# Patient Record
Sex: Male | Born: 1935 | Race: White | Hispanic: No | Marital: Married | Smoking: Former smoker
Health system: Southern US, Community
[De-identification: ages and names within clinical notes are randomized; demographics above are authoritative.]

## PROBLEM LIST (undated history)

## (undated) DIAGNOSIS — I10 Essential (primary) hypertension: Secondary | ICD-10-CM

## (undated) DIAGNOSIS — I2510 Atherosclerotic heart disease of native coronary artery without angina pectoris: Secondary | ICD-10-CM

## (undated) DIAGNOSIS — E119 Type 2 diabetes mellitus without complications: Secondary | ICD-10-CM

## (undated) DIAGNOSIS — I639 Cerebral infarction, unspecified: Secondary | ICD-10-CM

## (undated) DIAGNOSIS — Z85820 Personal history of malignant melanoma of skin: Secondary | ICD-10-CM

## (undated) DIAGNOSIS — Z8673 Personal history of transient ischemic attack (TIA), and cerebral infarction without residual deficits: Secondary | ICD-10-CM

## (undated) DIAGNOSIS — K922 Gastrointestinal hemorrhage, unspecified: Secondary | ICD-10-CM

## (undated) HISTORY — PX: JOINT REPLACEMENT: SHX530

## (undated) HISTORY — DX: Essential (primary) hypertension: I10

## (undated) HISTORY — DX: Atherosclerotic heart disease of native coronary artery without angina pectoris: I25.10

## (undated) HISTORY — PX: OTHER SURGICAL HISTORY: SHX169

## (undated) HISTORY — DX: Personal history of malignant melanoma of skin: Z85.820

## (undated) HISTORY — PX: KNEE SURGERY: SHX244

## (undated) HISTORY — DX: Type 2 diabetes mellitus without complications: E11.9

## (undated) HISTORY — DX: Personal history of transient ischemic attack (TIA), and cerebral infarction without residual deficits: Z86.73

---

## 2000-09-26 ENCOUNTER — Encounter: Payer: Self-pay | Admitting: Internal Medicine

## 2000-09-26 ENCOUNTER — Observation Stay (HOSPITAL_COMMUNITY): Admission: EM | Admit: 2000-09-26 | Discharge: 2000-09-28 | Payer: Self-pay | Admitting: Internal Medicine

## 2003-08-24 ENCOUNTER — Emergency Department (HOSPITAL_COMMUNITY): Admission: EM | Admit: 2003-08-24 | Discharge: 2003-08-24 | Payer: Self-pay | Admitting: Emergency Medicine

## 2004-06-10 ENCOUNTER — Ambulatory Visit: Payer: Self-pay

## 2004-07-08 ENCOUNTER — Ambulatory Visit: Payer: Self-pay | Admitting: *Deleted

## 2004-07-09 ENCOUNTER — Ambulatory Visit (HOSPITAL_COMMUNITY): Admission: RE | Admit: 2004-07-09 | Discharge: 2004-07-09 | Payer: Self-pay | Admitting: *Deleted

## 2004-07-09 ENCOUNTER — Ambulatory Visit: Payer: Self-pay | Admitting: *Deleted

## 2004-10-23 ENCOUNTER — Encounter: Payer: Self-pay | Admitting: Internal Medicine

## 2004-10-23 ENCOUNTER — Ambulatory Visit: Payer: Self-pay | Admitting: Internal Medicine

## 2004-10-23 ENCOUNTER — Inpatient Hospital Stay (HOSPITAL_COMMUNITY): Admission: EM | Admit: 2004-10-23 | Discharge: 2004-10-25 | Payer: Self-pay | Admitting: Emergency Medicine

## 2004-10-28 ENCOUNTER — Ambulatory Visit (HOSPITAL_COMMUNITY): Admission: RE | Admit: 2004-10-28 | Discharge: 2004-10-28 | Payer: Self-pay | Admitting: Internal Medicine

## 2008-06-04 ENCOUNTER — Ambulatory Visit (HOSPITAL_COMMUNITY): Admission: RE | Admit: 2008-06-04 | Discharge: 2008-06-04 | Payer: Self-pay | Admitting: Family Medicine

## 2009-04-17 ENCOUNTER — Ambulatory Visit (HOSPITAL_COMMUNITY): Admission: RE | Admit: 2009-04-17 | Discharge: 2009-04-17 | Payer: Self-pay | Admitting: Family Medicine

## 2009-05-15 ENCOUNTER — Observation Stay (HOSPITAL_COMMUNITY): Admission: EM | Admit: 2009-05-15 | Discharge: 2009-05-16 | Payer: Self-pay | Admitting: Emergency Medicine

## 2009-05-16 ENCOUNTER — Encounter (INDEPENDENT_AMBULATORY_CARE_PROVIDER_SITE_OTHER): Payer: Self-pay | Admitting: Internal Medicine

## 2009-12-30 ENCOUNTER — Ambulatory Visit (HOSPITAL_COMMUNITY)
Admission: RE | Admit: 2009-12-30 | Discharge: 2009-12-30 | Payer: Self-pay | Source: Home / Self Care | Attending: Family Medicine | Admitting: Family Medicine

## 2010-02-14 ENCOUNTER — Encounter (HOSPITAL_COMMUNITY): Payer: Self-pay

## 2010-02-14 ENCOUNTER — Ambulatory Visit (HOSPITAL_COMMUNITY)
Admission: RE | Admit: 2010-02-14 | Discharge: 2010-02-14 | Disposition: A | Discharge status: Home / Self Care | Payer: MEDICARE | Source: Ambulatory Visit | Attending: Family Medicine | Admitting: Family Medicine

## 2010-02-14 ENCOUNTER — Other Ambulatory Visit (HOSPITAL_COMMUNITY): Payer: Self-pay | Admitting: Family Medicine

## 2010-02-14 DIAGNOSIS — Z09 Encounter for follow-up examination after completed treatment for conditions other than malignant neoplasm: Secondary | ICD-10-CM

## 2010-02-14 DIAGNOSIS — J189 Pneumonia, unspecified organism: Secondary | ICD-10-CM | POA: Insufficient documentation

## 2010-04-01 LAB — DIFFERENTIAL
Basophils Absolute: 0.1 10*3/uL (ref 0.0–0.1)
Basophils Relative: 1 % (ref 0–1)
Eosinophils Absolute: 0.2 10*3/uL (ref 0.0–0.7)
Lymphocytes Relative: 34 % (ref 12–46)
Lymphs Abs: 1.7 10*3/uL (ref 0.7–4.0)
Monocytes Relative: 11 % (ref 3–12)

## 2010-04-01 LAB — COMPREHENSIVE METABOLIC PANEL
ALT: 34 U/L (ref 0–53)
BUN: 10 mg/dL (ref 6–23)
Chloride: 105 mEq/L (ref 96–112)
Creatinine, Ser: 0.96 mg/dL (ref 0.4–1.5)
GFR calc Af Amer: >60 mL/min (ref >60)
Potassium: 3.8 mEq/L (ref 3.5–5.1)
Total Bilirubin: 0.6 mg/dL (ref 0.3–1.2)

## 2010-04-01 LAB — LIPID PANEL
HDL: 23 mg/dL — ABNORMAL LOW (ref >39)
Total CHOL/HDL Ratio: 6.7 RATIO

## 2010-04-01 LAB — CBC
HCT: 41.5 % (ref 39.0–52.0)
MCHC: 34.7 g/dL (ref 30.0–36.0)

## 2010-04-01 LAB — GLUCOSE, CAPILLARY
Glucose-Capillary: 116 mg/dL — ABNORMAL HIGH (ref 70–99)
Glucose-Capillary: 94 mg/dL (ref 70–99)

## 2010-04-01 LAB — TROPONIN I: Troponin I: 0.01 ng/mL (ref 0.00–0.06)

## 2010-04-01 LAB — CK TOTAL AND CKMB (NOT AT ARMC)
Relative Index: 2.7 — ABNORMAL HIGH (ref 0.0–2.5)
Total CK: 157 U/L (ref 7–232)

## 2010-04-01 LAB — HOMOCYSTEINE: Homocysteine: 17.1 umol/L — ABNORMAL HIGH (ref 4.0–15.4)

## 2010-04-01 LAB — PROTIME-INR: Prothrombin Time: 12.8 seconds (ref 11.6–15.2)

## 2010-04-15 ENCOUNTER — Inpatient Hospital Stay (HOSPITAL_COMMUNITY)
Admission: EM | Admit: 2010-04-15 | Discharge: 2010-04-17 | DRG: 690 | Disposition: A | Discharge status: Home / Self Care | Payer: Medicare Other | Attending: Internal Medicine | Admitting: Internal Medicine

## 2010-04-15 ENCOUNTER — Emergency Department (HOSPITAL_COMMUNITY): Payer: Medicare Other

## 2010-04-15 DIAGNOSIS — R6889 Other general symptoms and signs: Secondary | ICD-10-CM | POA: Diagnosis present

## 2010-04-15 DIAGNOSIS — E871 Hypo-osmolality and hyponatremia: Secondary | ICD-10-CM | POA: Diagnosis present

## 2010-04-15 DIAGNOSIS — I10 Essential (primary) hypertension: Secondary | ICD-10-CM | POA: Diagnosis present

## 2010-04-15 DIAGNOSIS — E876 Hypokalemia: Secondary | ICD-10-CM | POA: Diagnosis present

## 2010-04-15 DIAGNOSIS — Z8673 Personal history of transient ischemic attack (TIA), and cerebral infarction without residual deficits: Secondary | ICD-10-CM

## 2010-04-15 DIAGNOSIS — N390 Urinary tract infection, site not specified: Principal | ICD-10-CM | POA: Diagnosis present

## 2010-04-15 DIAGNOSIS — E119 Type 2 diabetes mellitus without complications: Secondary | ICD-10-CM | POA: Diagnosis present

## 2010-04-15 LAB — DIFFERENTIAL
Basophils Relative: 0 % (ref 0–1)
Lymphs Abs: 1.1 10*3/uL (ref 0.7–4.0)
Neutrophils Relative %: 80 % — ABNORMAL HIGH (ref 43–77)

## 2010-04-15 LAB — POCT CARDIAC MARKERS
CKMB, poc: <1 ng/mL — ABNORMAL LOW (ref 1.0–8.0)
Myoglobin, poc: 84.7 ng/mL (ref 12–200)
Troponin i, poc: <0.05 ng/mL (ref 0.00–0.09)

## 2010-04-15 LAB — URINE MICROSCOPIC-ADD ON

## 2010-04-15 LAB — GLUCOSE, CAPILLARY: Glucose-Capillary: 146 mg/dL — ABNORMAL HIGH (ref 70–99)

## 2010-04-15 LAB — CBC
MCH: 26.5 pg (ref 26.0–34.0)
Platelets: 256 10*3/uL (ref 150–400)
RBC: 5.09 MIL/uL (ref 4.22–5.81)
RDW: 14.3 % (ref 11.5–15.5)

## 2010-04-15 LAB — URINALYSIS, ROUTINE W REFLEX MICROSCOPIC
Glucose, UA: NEGATIVE mg/dL
Specific Gravity, Urine: 1.02 (ref 1.005–1.030)
pH: 8 (ref 5.0–8.0)

## 2010-04-15 LAB — BASIC METABOLIC PANEL
BUN: 12 mg/dL (ref 6–23)
CO2: 21 mEq/L (ref 19–32)
Chloride: 99 mEq/L (ref 96–112)
Creatinine, Ser: 0.82 mg/dL (ref 0.4–1.5)

## 2010-04-16 LAB — GLUCOSE, CAPILLARY: Glucose-Capillary: 131 mg/dL — ABNORMAL HIGH (ref 70–99)

## 2010-04-16 LAB — CARDIAC PANEL(CRET KIN+CKTOT+MB+TROPI)
CK, MB: 1.2 ng/mL (ref 0.3–4.0)
Relative Index: INVALID (ref 0.0–2.5)
Troponin I: 0.01 ng/mL (ref 0.00–0.06)
Troponin I: 0.02 ng/mL (ref 0.00–0.06)
Troponin I: <0.01 ng/mL (ref 0.00–0.06)

## 2010-04-16 LAB — TSH: TSH: 1.175 u[IU]/mL (ref 0.350–4.500)

## 2010-04-17 LAB — BASIC METABOLIC PANEL
BUN: 8 mg/dL (ref 6–23)
CO2: 23 mEq/L (ref 19–32)
Chloride: 107 mEq/L (ref 96–112)
Creatinine, Ser: 0.66 mg/dL (ref 0.4–1.5)
Glucose, Bld: 117 mg/dL — ABNORMAL HIGH (ref 70–99)

## 2010-04-17 LAB — HEPATIC FUNCTION PANEL
ALT: 18 U/L (ref 0–53)
AST: 12 U/L (ref 0–37)
Albumin: 3.3 g/dL — ABNORMAL LOW (ref 3.5–5.2)
Alkaline Phosphatase: 66 U/L (ref 39–117)
Indirect Bilirubin: 0.4 mg/dL (ref 0.3–0.9)
Total Protein: 6.7 g/dL (ref 6.0–8.3)

## 2010-04-17 LAB — DIFFERENTIAL
Basophils Relative: 0 % (ref 0–1)
Monocytes Relative: 11 % (ref 3–12)
Neutro Abs: 6.8 10*3/uL (ref 1.7–7.7)
Neutrophils Relative %: 69 % (ref 43–77)

## 2010-04-17 LAB — CBC
Hemoglobin: 12.7 g/dL — ABNORMAL LOW (ref 13.0–17.0)
MCH: 25.8 pg — ABNORMAL LOW (ref 26.0–34.0)
RBC: 4.93 MIL/uL (ref 4.22–5.81)

## 2010-04-18 LAB — URINE CULTURE
Colony Count: >=100000
Culture  Setup Time: 201204032000

## 2010-04-18 NOTE — H&P (Signed)
NAME:  Steve Ramirez, Steve Ramirez NO.:  [id number]  MEDICAL RECORD NO.:  [id number]           PATIENT TYPE:  I  LOCATION:  A320                          FACILITY:  [facility]  PHYSICIAN:  Steve Siren, MD           DATE OF BIRTH:  01-29-1935  DATE OF ADMISSION:  04/15/2010 DATE OF DISCHARGE:  LH                             HISTORY & PHYSICAL   PRIMARY CARE PHYSICIAN:  Steve Mckusick, MD  ADVANCE DIRECTIVE:  Full code.  REASON FOR ADMISSION:  Dehydration, hypokalemia, and UTI.  HISTORY OF PRESENT ILLNESS:  This is a 75 year old male with history of TIA x3, hypertension, but generally healthy (workout aerobically three times a week for 90 minutes each) presents to [facility] emergency room complaining of feeling weak with subjective fever.  He denies any dysuria, nausea, vomiting or any flank pain.  He has no abdominal pains or cramps, chest pain or shortness of breath.  He had been on Zestoretic for over 15 years without any problem but evaluation today in the emergency room shows he has a potassium of 3.3 and serum sodium 131. Clinically, he is dehydrated as well.  His EKG shows sinus rhythm without any acute ST-T changes.  His urinalysis showed 21-50 wbc's and many bacteria.  He has a mild leukocytosis with a white count of 12.7000.  His head CT is negative, and his cardiac markers were negative as well.  His hemoglobin is 13.5.  Hospitalist was asked to admit the patient because of dehydration, UTI, and hypokalemia.  PAST MEDICAL HISTORY: 1. TIA x3. 2. Hypertension.  MEDICATIONS:  Lisinopril/hydrochlorothiazide and Aggrenox one b.i.d.  ALLERGIES:  No known drug allergies.  REVIEW OF SYSTEMS:  Otherwise, unremarkable.  SOCIAL HISTORY:  He lives with his wife.  He is a retired Secondary school teacher.  He has several children and grandchildren.  Denied tobacco, alcohol or drug use.  PHYSICAL EXAMINATION:  VITAL SIGNS:  Blood pressure 145/74, pulse of 109, respiratory  rate of 18, temperature 99.5. GENERAL:  He is alert and oriented and he is in no apparent distress. NEURO:  His speech is fluent.  Tongue is midline.  He has facial symmetry. NECK:  Supple. HEENT:  Throat is clear. CARDIAC:  S1-S2 regular.  I did not hear any murmur, rub or gallop. LUNGS:  Clear.  He has no carotid bruit. ABDOMEN:  Soft, nondistended, nontender.  He has no CVA tenderness. EXTREMITIES:  No edema.  Strength equal bilaterally. SKIN:  Warm and dry. NEUROLOGICAL:  [name] exam unremarkable as well.  OBJECTIVE FINDINGS:  EKG shows sinus tachycardia with first-degree AV block, left axis deviation but no acute ST-T changes.  Head CT show evidence of sinusitis but, otherwise, no acute disease.  Troponin less than 0.05.  Serum sodium 131, potassium 3.3, blood sugar of 167, creatinine 0.82.  White count of 12.7000, hemoglobin 13.5, platelet count 256,000.  Urinalysis shows 21-50 wbc's with many bacteria.  Chest x-ray show elevation of left hemidiaphragm with linear density left base question of atelectasis versus fibrosis.  There is no infiltrate.  IMPRESSION:  This is a 75 year old male with  history of transient ischemic attack on Aggrenox, hypertension on Zestoretic, presented with urinary tract infection, hypokalemia, and clinically volume depleted. We will admit him and replete his potassium.  He will be given intravenous fluid.  I will continue his lisinopril, but will stop hydrochlorothiazide.  We will treat his urinary tract infection with Rocephin, and continue his Aggrenox for his recurrent transient ischemic attacks which apparently has been working well for him since he had not any symptoms for over a year.  He is a full code.  Will be admitted to [facility] 1.  He is stable.     Steve Siren, MD     PL/MEDQ  D:  04/15/2010  T:  04/15/2010  Job:  [id number]  cc:   Steve Ramirez, M.D. Fax: [phone]  Electronically Signed by Steve Ramirez  on 04/18/2010 10:41:57  PM

## 2010-04-22 NOTE — Discharge Summary (Signed)
NAME:  Steve Ramirez, Steve Ramirez NO.:  [id number]  MEDICAL RECORD NO.:  [id number]           PATIENT TYPE:  I  LOCATION:  A320                          FACILITY:  [facility]  PHYSICIAN:  Elliot Cousin, M.D.    DATE OF BIRTH:  January 05, 1936  DATE OF ADMISSION:  04/15/2010 DATE OF DISCHARGE:  04/05/2012LH                              DISCHARGE SUMMARY   DISCHARGE DIAGNOSES: 1. Generalized weakness secondary to urinary tract infection,     hypokalemia, and hyponatremia. 2. Hyponatremia, likely secondary to volume depletion. 3. Hypokalemia with lisinopril/hydrochlorothiazide     treatment. 4. Low free T4 of 0.67 but normal TSH of 1.175.  The result of the     free T3 was pending at the time of hospital discharge. 5. Hypertension. 6. History of transient ischemic attacks x3. 7. Diet-controlled type 2 diabetes mellitus.  DISCHARGE MEDICATIONS: 1. Ceftin 500 mg b.i.d. for seven more days. 2. Potassium chloride 20 mEq daily for 14 more days. 3. Aggrenox 25/200 mg one capsule b.i.d. 4. Diphenhydramine 25 mg one to two tablets every 6 hours as needed     for allergy symptoms. 5. Lisinopril/hydrochlorothiazide 20/12.5 mg b.i.d. 6. Loratadine 10 mg daily as needed for allergy symptoms. 7. Multivitamin once daily.  DISCHARGE DISPOSITION:  The patient was discharged to home in improved and stable condition on April 17, 2010.  He will follow up with Dr. Phillips Odor in 1-2 weeks.  CONSULTATIONS:  None.  PROCEDURES PERFORMED: 1. Chest x-ray on April 15, 2010.  The results revealed elevation of     the left hemidiaphragm with a linear density in the left base which     may reflect segmental atelectasis or fibrosis.  No consolidation or     pleural effusion is seen.  Slight flattening of the diaphragm on     the lateral image suggesting minimal hyperinflation. 2. CT scan of the head without contrast on April 15, 2010.  The results     revealed stable nonspecific cerebral white matter changes.   No     acute intracranial abnormality.  Chronic paranasal sinus     inflammatory changes with interval increase.  HISTORY OF PRESENT ILLNESS:  The patient is a 75 year old man with a history of TIAs x3 and hypertension, who presented to the emergency department on April 15, 2010, with a chief complaint of generalized weakness and subjective fever.  When he was initially evaluated, he was noted to be febrile with a temperature of 100.2 and hypertensive with a blood pressure of 160/85.  He was also mildly tachycardic.  His EKG revealed sinus tachycardia with a first-degree AV block and a heart rate of 108 beats per minute.  The CT scan of his head revealed no acute intracranial findings.  His initial cardiac markers were negative.  His WBC was elevated at 12.7.  His sodium was low at 131.  His potassium was also low at 3.3.  His urinalysis revealed 25-50 wbc's and many bacteria. He was admitted for further evaluation and management.  HOSPITAL COURSE:  The patient was given a dose of Cipro intravenously in the emergency department.  Following admission, he was started  on Rocephin 1 g daily.  IV fluids were started for volume repletion. Lisinopril was restarted for blood pressure control; however, hydrochlorothiazide was temporarily withheld while he was being hydrated.  For further evaluation, a number of studies were ordered. All of his cardiac enzymes were well within normal limits.  His vitamin B12 was within normal limits at 271.  His hepatic function panel was within normal limits.  His TSH was within normal limits at 1.175; however, his free T4 was low at 0.67.  Starting the the patient on Synthroid was contemplated; however, he wanted to discuss starting replacement therapy with Dr. Phillips Odor first.   Therefore, free T3 was ordered before he was discharged.  The result was pending at the time of hospital discharge.  If his free T3 is low, I will discuss the finding with Dr.  Phillips Odor.  The patient's blood pressure remained in the 160s to 150s systolically. He was advised to restart lisinopril/hydrochlorothiazide upon discharge given that he was well hydrated.  His serum potassium improved to 3.9.  He was discharged on potassium chloride supplementation for two more weeks.  His serum sodium improved to 140.  His BUN was 8 and his creatinine was 0.66 prior to hospital discharge.  His hemoglobin fell slightly to 12.3, but this was secondary to the dilutional effects of the IV fluids.  He received a total of three doses of intravenous antibiotics.  He was discharged home on seven more days of Ceftin. The urine culture was pending at discharge.  DISCHARGE LABORATORY STUDIES:  Sodium 140, potassium 3.9, chloride 107, CO2 is 23, glucose 117, BUN 8, creatinine 0.66, calcium 8.6, total bilirubin 0.6, direct bilirubin 0.2, indirect bilirubin 0.4, alkaline phosphatase 66, SGOT 12, SGPT 18, total protein 6.7, albumin 3.3, WBC 9.8, hemoglobin 12.7, platelet count 234.     Elliot Cousin, M.D.     DF/MEDQ  D:  04/17/2010  T:  04/18/2010  Job:  [id number]  cc:   Corrie Mckusick, M.D. Fax: [phone]  Electronically Signed by Elliot Cousin M.D. on 04/22/2010 08:51:22 AM

## 2010-05-30 NOTE — Op Note (Signed)
NAME:  Steve Ramirez, Steve Ramirez NO.:  [id number]   MEDICAL RECORD NO.:  [id number]          PATIENT TYPE:  INP   LOCATION:  A202                          FACILITY:  [facility]   PHYSICIAN:  Lionel December, M.D.    DATE OF BIRTH:  1935-02-12   DATE OF PROCEDURE:  10/26/2004  DATE OF DISCHARGE:  10/25/2004                                 OPERATIVE REPORT   PROCEDURE:  Small bowel given capsule study.   ENDOSCOPIST:  Lionel December, M.D.   INDICATIONS:  [name] is a 75 year old Caucasian male with history of  recurrent GI bleed, although he has not had an episode for almost four  years.  He presented with tarry stools.  He had esophagogastroduodenoscopy  yesterday without findings to account for his GI bleed.  He has undergone  colonoscopy in the past revealing pan colonic diverticulosis. He also has  had bleeding scans in the past which have been negative.  It is possible  that he has been losing blood from his small bowel and is therefore  undergoing this study.   Procedure risks were reviewed with the patient and informed consent was  obtained.   FINDINGS:  The patient swallowed capsule without any difficulty.  The  capsule made it into the stomach in 1 minute and 3 seconds and into the  duodenum at 1 hour and 19 minutes. Study duration was 8 hours but the  capsule never made it to the cecum.   Mucosa of the proximal half or two-thirds was normal.  Starting at hour and  27 minutes,  there was fresh and dark blood.  For the next 2-1/2 hours, that  was all that was seen, some fresh but mostly dark blood.  Because of  presence of blood, examination of mucosa was suboptimal but mucosa that was  seen was normal.   FINAL DIAGNOSES:  1.  Abnormal study with blood in the distal third to distal half of small      bowel limiting examination of mucosa.  2.  Capsule never made it to the cecum in this study.   RECOMMENDATION:  1.  Now that the bleeding has been localized to the distal  half of the small      bowel, we are ready to proceed with Meckel's scan.  2.  If scan is negative, we will do small-bowel follow-through followed by      repeat capsule study when he is not bleeding.  3.  Will hold off his iron therapy for now.  4.  If all of these studies are negative, we may outpatient to just watch      him or send him to a tertiary center for balloon enteroscopy.      Lionel December, M.D.  Electronically Signed     NR/MEDQ  D:  10/26/2004  T:  10/26/2004  Job:  [id number]   cc:   Patrica Duel, M.D.  Fax: [phone]

## 2010-05-30 NOTE — Procedures (Signed)
NAME:  Steve Ramirez, Steve Ramirez NO.:  [id number]   MEDICAL RECORD NO.:  [id number]          PATIENT TYPE:  OUT   LOCATION:  RAD                           FACILITY:  [facility]   PHYSICIAN:  Vida Roller, M.D.   DATE OF BIRTH:  [birth year]   DATE OF PROCEDURE:  07/09/2004  DATE OF DISCHARGE:                                  ECHOCARDIOGRAM   Tape Number [id number], Tape Count [id number]   This is a 75 year old man with mildly decreased LV systolic function and  hypertension.   Technical quality of the study is adequate.   M-MODE TRACING:  The aorta is 32 mm.   The left atrium is 41 mm.   The septum is 15 mm.   The posterior wall is 13 mm.   Left ventricular diastolic dimension 46 mm.   Left ventricular systolic dimension 37 mm.   2-D AND DOPPLER IMAGING:  The left ventricle is normal size with mildly  decreased LV systolic function with an estimated ejection fraction of 45 to  50%.  There was mild global hypokinesis with moderate left ventricular  hypertrophy.   The right ventricle is normal size with mild hypertrophy of the free wall  seen best in the parasternal long axis view as well as the subcostal view.  There is normal RV systolic function.   The left atrium appears to be slightly enlarged.  Right atrium is normal  size.   The aortic valve is sclerotic with trace insufficiency.  No stenosis is  seen.   The mitral valve is thickened with mild mitral regurgitation.  No stenosis  is seen.   There is mild tricuspid regurgitation.   The pulmonic valve is not well seen.   There is no pericardial effusion.   The inferior vena cava appears to be normal size.   The ascending aorta is normal to limits of the study.       JH/MEDQ  D:  07/09/2004  T:  07/09/2004  Job:  [id number]   cc:   Corrie Mckusick, M.D.  Fax: [phone]

## 2010-05-30 NOTE — H&P (Signed)
NAME:  JADD, GASIOR NO.:  [id number]   MEDICAL RECORD NO.:  [id number]          PATIENT TYPE:  INP   LOCATION:  A202                          FACILITY:  [facility]   PHYSICIAN:  Corrie Mckusick, M.D.  DATE OF BIRTH:  [birth year]   DATE OF ADMISSION:  10/23/2004  DATE OF DISCHARGE:  LH                                HISTORY & PHYSICAL   ADMISSION DIAGNOSIS:  Gastrointestinal bleed.   HISTORY OF PRESENT ILLNESS:  This is a 75 year old gentleman with  hypertension, history of gastric hemorrhage, GERD, history of diverticula,  history of colon polyps and history of anemia as well as hyperlipidemia who  presents with GI bleed x6 hours.  He has had a long history of significant  gastric hemorrhages in the past.  Dr. Jena Gauss and Dr. Karilyn Cota know him well and  he has really had a difficult time finding the bleeding source.  He again  woke up this morning with a little bit of abdominal cramping and had  melanotic stools.  He is otherwise stable and cardiovascular and respiratory  review of systems are all negative.   PAST MEDICAL HISTORY:  1.  Hypertension.  2.  GERD.  3.  Gastric hemorrhage.  4.  Colon polyp.  5.  Diverticula.  6.  History of iron deficiency anemia.  7.  Hyperlipidemia.   PAST SURGICAL HISTORY:  1.  Cervical fusion.  2.  Tonsillectomy and adenoidectomy.  3.  Left knee surgery.  4.  Hemorrhoid surgery.   SOCIAL HISTORY:  He is retired from Paramedic.  He does not  drink or smoke.   FAMILY HISTORY:  Noncontributory.   MEDICATIONS:  1.  Lotensin/HCTZ 20/12.5 b.i.d.  2.  Multivitamin daily.   ALLERGIES:  No known drug allergies.   PHYSICAL EXAMINATION:  GENERAL:  When I saw the patient, he was pleasant,  talking with good color in no acute distress.  VITAL SIGNS:  Temperature 97.9, blood pressure 146/91, pulse 70,  respirations 20, O2 saturations 96% on room air.  HEENT:  Nasopharynx is clear with moist mucus membranes.  NECK:   Supple with no lymphadenopathy.  CHEST:  Clear to auscultation bilaterally.  CARDIAC:  Regular rate and rhythm.  Normal S1, S2.  No S3, murmurs, rubs or  gallops.  ABDOMEN:  Bowel sounds slightly soft, nontender, nondistended.   LABORATORY DATA AND X-RAY FINDINGS:  CBC and Chem 12 overall normal with a  hematocrit of 38.0.   ASSESSMENT:  A 75 year old gentleman with hypertension, history of colonic  polyps, gastroesophageal reflux disease, diverticula and gastrointestinal  bleed in the past who presents with probable upper gastrointestinal bleed.   PLAN:  1.  Admit to 2A for close monitoring.  2.  IV fluids.  3.  Continue current medications.  4.  This patient is NPO and he is going to be seen by Dr. Jena Gauss very soon      for further workup and probable capsule study.  5.  Will continue to follow closely.  Dr. Regino Schultze will be following the      patient as I am going out  of town today.      Corrie Mckusick, M.D.  Electronically Signed     JCG/MEDQ  D:  10/23/2004  T:  10/23/2004  Job:  [id number]

## 2010-05-30 NOTE — Discharge Summary (Signed)
[facility]  Patient:    Steve Ramirez, Steve Ramirez Visit Number: [id number] MRN: [id number]          Service Type: Attending:  Dorthey Sawyer, M.D. Dictated by:   Dorthey Sawyer, M.D. Adm. Date:  09/26/00 Disc. Date: 09/28/00                             Discharge Summary  DISCHARGE DIAGNOSES:  Gastrointestinal bleed.  HISTORY OF PRESENT ILLNESS:  Please see admission H&P.  HOSPITAL COURSE:  A 75 year old gentleman with history of hypertension who was admitted with GI bleed originally thought to be from an upper GI source.  He did have a history of colon polyps.  He had had grossly positive tar like stools.  He was admitted for observation following serial hematocrits.  Type and cross holding 2 units.  IV Pepcid 20 mg q.12h.  EGD was needed, therefore gastroenterology was consulted.  Roetta Sessions, M.D. saw the patient and suggested EGD.  EGD was done on September 27, 2000 showing small 1 cm submucosal nodule in the duodenum and multiple erosions in the antrum and stomach.  No definitive source of bleeding.  It was decided to continue with colonoscopy.  On September 28, 2000 colonoscopy was done showing diverticulosis.  Again, it was not clear that this would have been the source for the bleed.  It was discussed with the patient due to the findings that a capsule study might be warranted.  Patient wanted to hold on this for now.  Hematocrit remained stable at 40.5 throughout the hospital stay.  No further bleeding.  The patient felt quite well.  Patient was ready for discharge on September 28, 2000 and felt quite well.  PHYSICAL EXAMINATION  CHEST:  Clear.  CARDIOVASCULAR:  Regular rate and rhythm.  No murmur.  ABDOMEN:  Soft, nontender.  Patient was discharged on pre admit medications with the addition of Aciphex 20 mg q.d.  Pathology pending from biopsies. Dictated by:   Dorthey Sawyer, M.D. Attending:  Dorthey Sawyer, M.D. DD:  10/18/00 TD:   10/18/00 Job: [id number]

## 2010-05-30 NOTE — Consult Note (Signed)
NAME:  Steve Ramirez, Steve Ramirez NO.:  [id number]   MEDICAL RECORD NO.:  [id number]          PATIENT TYPE:  INP   LOCATION:  A202                          FACILITY:  [facility]   PHYSICIAN:  R. Roetta Sessions, M.D. DATE OF BIRTH:  Jul 14, 1935   DATE OF CONSULTATION:  10/23/2004  DATE OF DISCHARGE:                                   CONSULTATION   REASON FOR CONSULTATION:  GI bleed, melena.   HISTORY OF PRESENT ILLNESS:  Mr. Aguila is a 75 year old Caucasian  gentleman with history of recurrent GI bleeding felt to be due to  diverticular bleed in the past.  He has had at least five or six episodes  over the last six or seven years.  The last time we saw him was in 2002.  In  September of 2002 he was hospitalized with another GI bleed and at that time  had an EGD with evidence of a 1-cm submucosal nodule in the duodenum,  multiple erosions in the stomach and antrum.  H pylori serologies and  CLOtest have been negative in the past.  He also had a colonoscopy which  revealed scattered diverticula, some in the splenic flexure and cecum as  well.  He has not had any endoscopic evaluation since.  He has received  transfusions for GI bleeds in the past and believes that he has had a total  of about 5 or 6 units of blood over the years.  He ws feeling well.  He woke  up this morning and his stomach started grumbling.  He went to the bathroom  and passed black stool.  This has happened once this morning.  He denies any  abdominal pain, nausea, vomiting.  His appetite has been good.  No  heartburn.  Bowel movements are generally regular.  He is not on any NSAIDs  or aspirin.  He has been fairly healthy.  A week ago, he was treated for  inner ear and was given Antivert and a steroid injection.  Otherwise, he has  been in fairly good health.   He was evaluated in the emergency department and found to have a hemoglobin  of 13.1, hematocrit 38, MCV 78.2, INR 1, BUN 17, creatinine 0.8,  glucose  129, white count 5100, platelets 299,000.   MEDICATIONS:  1.  Lisinopril.  2.  Hydrochlorothiazide.  3.  Multivitamin.  4.  Benadryl.   ALLERGIES:  NO KNOWN DRUG ALLERGIES.   PAST MEDICAL HISTORY:  1.  History of recurrent GI bleed.  The last time was in September of 2002,      as outlined above.  He has had multiple endoscopies in the past.  He      reports having some bleeding scans in the past as well.  At least one in      1997 showed some activity in the descending or sigmoid colon.  In 2000,      bleeding scan was negative.  2.  History of iron deficiency anemia felt to be multifactorial due to GI      bleed and frequent blood donations in the past.  He  has not had any      problems with this recently.  3.  History of palpable spleen on exam.  Further details unavailable to me      at the time.  4.  Hypertension.  5.  History of hyperplastic colon polyps.  6.  Allergies.  7.  No known heart disease per patient.  8.  Cervical vertebral fusion around 1996 of C3, C4, C5, and C6.  9.  Hemorrhoidectomy.  10. Left knee arthroscopy.   FAMILY HISTORY:  Father had diabetes mellitus.  Mother had emphysema.  Brother died of a MVA caused by a drunk driver at age 67.  Another brother  died in a plane crash.  He was a Pharmacist, hospital.  No family history of  colorectal carcinoma or IBD.   SOCIAL HISTORY:  He is married.  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  He quit  smoking 25 years ago.  No alcohol use.  He has three children and five  grandchildren.   REVIEW OF SYSTEMS:  See HPI for GI.  CONSTITUTIONAL:  No weight loss.  CARDIOPULMONARY:  No chest pain or shortness of breath.   PHYSICAL EXAMINATION:  VITAL SIGNS:  Heart rate 80, respirations 20,  afebrile.  GENERAL:  Pleasant, well-nourished, well-developed Caucasian male in no  acute distress.  SKIN:  Warm and dry.  No jaundice.  HEENT:  Conjunctivae  pink.  Sclerae nonicteric.  Oropharyngeal mucosa moist  and pink.  No lesions, erythema, or exudate.  NECK:  No lymphadenopathy or thyromegaly.  CHEST:  Lungs clear to auscultation.  CARDIAC:  Regular rate and rhythm.  Normal S1 and S2.  No murmurs, rubs, or  gallops.  ABDOMEN:  Positive bowel sounds.  Soft, nontender, nondistended.  Do not  appreciate spleen.  No hepatomegaly or other masses.  He has a tiny  umbilical hernia, easily reducible, nontender.  The abdomen is nontender,  and no rebound tenderness or guarding.  EXTREMITIES:  No edema.   LABORATORY DATA:  As mentioned in the HPI.  In addition, sodium 134,  potassium 3.6.   IMPRESSION:  The patient is a 75 year old Caucasian gentleman with history  of recurrent gastrointestinal bleeding felt to be due to diverticula in the  past.  He has had multiple endoscopic evaluations, as outlined above.  It  has been about four years since his last evaluation for bleed.  Given the  melena, it would be reasonable to recheck his upper gastrointestinal tract  today.  He has never had a Givens capsule endoscopy previously.  It would be  reasonable to consider this to rule out any other possible sources of  bleeding.  He is known to have right colonic diverticula, and this could  cause melena with bleed.   PLAN:  Will monitor his hemoglobin and hematocrit.  Will recheck this at 2  p.m. today.  Will keep him n.p.o.  Agree with IV Protonix.  Plan on upper  endoscopy today with Dr. Jena Gauss.  Further recommendations to follow.   I would like to thank Dr. Phillips Odor for allowing [place] to participate in the care  of this patient.      Tana Coast, P.AJonathon Bellows, M.D.  Electronically Signed    LL/MEDQ  D:  10/23/2004  T:  10/23/2004  Job:  [id number]   cc:   Corrie Mckusick, M.D.  Fax: [phone]   Day  surgery  [facility]

## 2010-05-30 NOTE — Op Note (Signed)
NAME:  Steve Ramirez, Steve Ramirez NO.:  [id number]   MEDICAL RECORD NO.:  [id number]          PATIENT TYPE:  INP   LOCATION:  A202                          FACILITY:  [facility]   PHYSICIAN:  R. Roetta Sessions, M.D. DATE OF BIRTH:  01/10/1936   DATE OF PROCEDURE:  10/23/2004  DATE OF DISCHARGE:                                 OPERATIVE REPORT   PROCEDURE:  Esophagogastroduodenoscopy with biopsy.   INDICATIONS FOR PROCEDURE:  The patient is a 75 year old gentleman admitted  to the hospital with melena. He has a history of GI bleeding of obscure  etiology. EGD is now being done. This approach has been discussed with the  patient at length. Potential risks, benefits, and alternatives have been  reviewed and questions answered. He is agreeable. Please see documentation  in the medical record.   PROCEDURE NOTE:  O2 saturation, blood pressure, pulse, and respirations were  monitored throughout the entire procedure. Conscious sedation with IV Versed  and Demerol in incremental doses.  Cetacaine spray for topical oropharyngeal  anesthesia.   INSTRUMENT:  Olympus video chip system.   FINDINGS:  Examination of the tubular esophagus revealed multiple small 1 to  3 mm pale nodules likely representing squamous papillomas. Mucosa otherwise  appeared normal. EGD junction easily traversed.   Stomach:  Gastric cavity was empty and insufflated well with air. Thorough  examination of gastric mucosa including retroflexed view of the proximal  stomach and esophagogastric junction demonstrated some circular focal what  appeared to be erosions with some overlying plaquing on the angularis.  Please see photos. There is no blood in the stomach. There was a 5-mm fundal  gland polyp along the greater curvature which was not manipulated. Pylorus  patent and easily traversed. Examination of bulb and second portion revealed  what appeared to be a 1-cm lipoma versus leiomyoma in the second portion of  the  duodenum. It did have a central depression or a volcano appearance with  a central area of what appeared to be erosion. The remainder of D1 and D2  appeared normal. This lesion was palpated with the biopsy forceps, and I  could not illicit any bleeding. This did appear to be a submucosal lesion.   THERAPEUTIC/DIAGNOSTIC MANEUVERS:  The area of focal plaque-like erosions on  the angularis was biopsied twice for histologic study with minimal bleeding.  Subsequently, one of the nodules in the esophagus was biopsied for  histologic study. The patient tolerated the procedure well and was reactive  to endoscopy.   IMPRESSION:  1.  Multiple small 1 to 3 mm pale raised nodules in the esophagus, likely      representing squamous papillomas (benign lesion), biopsied, otherwise      normal esophagus.  2.  Fundal gland polyp on the greater curvature not manipulated. Focal      eroded mucosa with overlying plaques, unusual appearance but of      uncertain clinical significance along the angularis, biopsied with      minimal bleeding otherwise normal stomach. Patent pylorus. Examination      of D1 and D2 revealed a 1-cm lipoma versus leiomyoma  with central      depression and central eroded area but no active bleeding and no active      bleeding precipitated with the biopsy palpation.   DISCUSSION:  The lesion in the duodenum is suspect to me. Leiomyomas can  sometimes bleed intermittently in significant amounts and otherwise be  difficult to indict as the cause of obscure GI bleeding.   RECOMMENDATIONS:  Will proceed with a Given small bowel capsule tomorrow and  will make further recommendations after that study has been reviewed.      Jonathon Bellows, M.D.  Electronically Signed     RMR/MEDQ  D:  10/23/2004  T:  10/23/2004  Job:  [id number]

## 2010-05-30 NOTE — Discharge Summary (Signed)
NAME:  Steve Ramirez, Steve Ramirez NO.:  [id number]   MEDICAL RECORD NO.:  [id number]          PATIENT TYPE:  INP   LOCATION:  A202                          FACILITY:  [facility]   PHYSICIAN:  Corrie Mckusick, M.D.  DATE OF BIRTH:  [birth year]   DATE OF ADMISSION:  10/23/2004  DATE OF DISCHARGE:  10/14/2006LH                                 DISCHARGE SUMMARY   HISTORY OF PRESENT ILLNESS:  Please see admission H&P.   PAST MEDICAL HISTORY:  Please see admission H&P.   HOSPITAL COURSE:  A 76 year old gentleman with hypertension, history of  colon polyps, GERD, diverticula, and gastrointestinal bleed in the past who  presents with a probable upper gastrointestinal bleed.  He is admitted NPO  and seen by Dr. Jena Gauss.  For the workup please see his note on admission and  consult for details.   EGD was done showing a 1-cm lesion and some erosions.  Capsule study was  done on October 24, 2004.  H&H had remained stable after admission.  I went  on vacation, was no longer rounding on the patient.  My partner, Dr. Patrica Duel, rounded the next two days.   Dr. Karilyn Cota saw the patient on October 25, 2004.  Hematocrit was stable at  32.  He was set up for a small-bowel follow-through as an outpatient and  possible repeat capsule study as the data had returned from the initial  capsule study was really inconclusive.   He was discharged home, on October 25, 2004, with no new medications.  He  was to continue his own home pre-hospital meds and Dr. Karilyn Cota set up a small-  bowel follow-through on October 28, 2004.   DISCHARGE PHYSICAL:  Please see Dr. Geanie Logan note on the day of discharge.   DISCHARGE CONDITION:  Improved and stable.      Corrie Mckusick, M.D.  Electronically Signed     JCG/MEDQ  D:  12/03/2004  T:  12/03/2004  Job:  [id number]

## 2010-08-12 MED ORDER — SODIUM CHLORIDE 0.45 % IV SOLN
Freq: Once | INTRAVENOUS | Status: AC
  Administered 2010-08-13: 1000 mL via INTRAVENOUS

## 2010-08-13 ENCOUNTER — Other Ambulatory Visit (INDEPENDENT_AMBULATORY_CARE_PROVIDER_SITE_OTHER): Payer: Self-pay | Admitting: Internal Medicine

## 2010-08-13 ENCOUNTER — Ambulatory Visit (HOSPITAL_COMMUNITY)
Admission: RE | Admit: 2010-08-13 | Discharge: 2010-08-13 | Disposition: A | Discharge status: Home / Self Care | Payer: Medicare Other | Source: Ambulatory Visit | Attending: Internal Medicine | Admitting: Internal Medicine

## 2010-08-13 ENCOUNTER — Encounter (HOSPITAL_COMMUNITY)
Admission: RE | Disposition: A | Discharge status: Home / Self Care | Payer: Self-pay | Source: Ambulatory Visit | Attending: Internal Medicine

## 2010-08-13 ENCOUNTER — Encounter (HOSPITAL_COMMUNITY): Payer: Self-pay | Admitting: *Deleted

## 2010-08-13 ENCOUNTER — Encounter (INDEPENDENT_AMBULATORY_CARE_PROVIDER_SITE_OTHER): Payer: Medicare Other | Admitting: Internal Medicine

## 2010-08-13 DIAGNOSIS — K5730 Diverticulosis of large intestine without perforation or abscess without bleeding: Secondary | ICD-10-CM

## 2010-08-13 DIAGNOSIS — Z79899 Other long term (current) drug therapy: Secondary | ICD-10-CM | POA: Insufficient documentation

## 2010-08-13 DIAGNOSIS — D126 Benign neoplasm of colon, unspecified: Secondary | ICD-10-CM

## 2010-08-13 DIAGNOSIS — K644 Residual hemorrhoidal skin tags: Secondary | ICD-10-CM

## 2010-08-13 DIAGNOSIS — I10 Essential (primary) hypertension: Secondary | ICD-10-CM | POA: Insufficient documentation

## 2010-08-13 DIAGNOSIS — E119 Type 2 diabetes mellitus without complications: Secondary | ICD-10-CM | POA: Insufficient documentation

## 2010-08-13 DIAGNOSIS — Z1211 Encounter for screening for malignant neoplasm of colon: Secondary | ICD-10-CM | POA: Insufficient documentation

## 2010-08-13 HISTORY — PX: COLONOSCOPY: SHX5424

## 2010-08-13 HISTORY — DX: Gastrointestinal hemorrhage, unspecified: K92.2

## 2010-08-13 SURGERY — COLONOSCOPY
Anesthesia: Moderate Sedation

## 2010-08-13 MED ORDER — MIDAZOLAM HCL 5 MG/5ML IJ SOLN
INTRAMUSCULAR | Status: DC | PRN
  Administered 2010-08-13: 2 mg via INTRAVENOUS
  Administered 2010-08-13: 1 mg via INTRAVENOUS
  Administered 2010-08-13: 2 mg via INTRAVENOUS

## 2010-08-13 MED ORDER — MEPERIDINE HCL 50 MG/ML IJ SOLN
INTRAMUSCULAR | Status: DC | PRN
  Administered 2010-08-13 (×2): 25 mg via INTRAVENOUS

## 2010-08-13 MED ORDER — SODIUM CHLORIDE 0.9 % IR SOLN
Status: DC | PRN
  Administered 2010-08-13: 8 mL

## 2010-08-13 MED ORDER — STERILE WATER FOR IRRIGATION IR SOLN
Status: DC | PRN
  Administered 2010-08-13: 08:00:00

## 2010-08-13 NOTE — Brief Op Note (Signed)
08/13/2010  8:27 AM  PATIENT:  Steve Ramirez  75 y.o. male  PRE-OPERATIVE DIAGNOSIS:  Screening  POST-OPERATIVE DIAGNOSIS:  diverticulosis; tortous colon; cecal polyp  PROCEDURE:  Procedure(s): COLONOSCOPY  SURGEON:  Surgeon(s): Malissa Hippo, MD  Colonoscopy note; Exam to cecum Redundant transverse colon and hepatic flexure Pancolonic diverticulosis; 6 mm sessile polyp at cecum post appendiceal orifice saline cyst saline-assisted polypectomy performed; Rectal mucosa normal; Noncritical narrowing to anal canal;

## 2010-08-13 NOTE — Op Note (Addendum)
NAME:  Steve Ramirez, Steve Ramirez NO.:  [id number]  MEDICAL RECORD NO.:  [id number]  LOCATION:  APPO                          FACILITY:  [facility]  PHYSICIAN:  Lionel December, M.D.    DATE OF BIRTH:  Apr 29, 1935  DATE OF PROCEDURE:  08/13/2010 DATE OF DISCHARGE:  08/13/2010                              OPERATIVE REPORT   PROCEDURE:  Colonoscopy.  INDICATIONS:  [name] is a 75 year old Caucasian male who is undergoing average risk screening colonoscopy.  Procedure risks were reviewed with the patient and informed consent was obtained.  MEDICATIONS FOR CONSCIOUS SEDATION: 1. Demerol 50 mg IV. 2. Versed 5 mg IV.  FINDINGS:  Procedure performed in endoscopy suite.  The patient's vital signs and O2 sats were monitored during the procedure and remained stable.  The patient was placed in left lateral recumbent position and rectal examination performed.  Noncritical narrowing noted to anal canal.  Olympus video scope was passed through rectum and advanced under vision into sigmoid colon beyond.  He had moderate number of diverticula at sigmoid colon.  Some were quite large and in rest of the colon he had diverticula scattered as well.  Very redundant transverse colon, hepatic flexure with using different positions and abdominal pressure, I was able to advance the scope into cecum which was identified by appendiceal orifice and ileocecal valve.  There was a 6-mm sessile polyp to the left appendiceal orifice with difficult approach.  This was elevated with saline injection and snared.  Polypectomy was complete.  This polyp was retrieved for a sludge examination.  Mucosa and rest of the colon was normal.  Rectal mucosa similarly was normal.  Scope was retroflexed to examine anorectal junction which was unremarkable.  Endoscope was straightened and withdrawn.  Withdrawal time was 13 minutes.  The patient tolerated the procedure well.  FINAL DIAGNOSES: 1. Examination performed to  cecum. 2. Pancolonic diverticulosis. 3. A 6-mm cecal polyp snared following saline injection. 4. Noncritical narrowing to anal canal.  RECOMMENDATIONS: 1. He will resume his Aggrenox in 3 days. 2. High-fiber diet. 3. I will be contacting the patient with results of biopsy and    further recommendations.          ______________________________ Lionel December, M.D.     NR/MEDQ  D:  08/13/2010  T:  08/13/2010  Job:  [id number]  cc:   Corrie Mckusick, M.D. Fax: [phone]

## 2010-08-13 NOTE — H&P (Signed)
Steve Ramirez is an 75 y.o. male.   Chief Complaint: For colonoscopy HPI: Patient is a 75 year old Caucasian male who is here for screening colonoscopy. He has history of GI bleed felt to be secondary to colonic diverticulosis but not recently his most recent evaluation for GI bleed was in 2006 with EGD and small bowel given capsule study his last colonoscopy was few years prior to that. He has a good appetite he denies abdominal pain change in his bowel habits melena or rectal bleeding. Family history is negative for colorectal carcinoma 3  Past Medical History  Diagnosis Date  . Cancer   . Diabetes mellitus   . Hypertension   . Blood transfusion 2004 (?)    Past Surgical History  Procedure Date  . Tla 2010    No family history on file. Social History:  reports that he has quit smoking. He does not have any smokeless tobacco history on file. He reports that he does not drink alcohol or use illicit drugs.  Allergies: No Known Allergies  Medications Prior to Admission  Medication Dose Route Frequency Provider Last Rate Last Dose  . 0.45 % sodium chloride infusion   Intravenous Once Malissa Hippo, MD 20 mL/hr at 08/13/10 0707 1,000 mL at 08/13/10 0707  . simethicone susp 15 ml in sterile water 1000 ml irrigation    PRN Malissa Hippo, MD       Medications Prior to Admission  Medication Sig Dispense Refill  . cetirizine-pseudoephedrine (ZYRTEC-D) 5-120 MG per tablet Take 1 tablet by mouth 2 (two) times daily.        Marland Kitchen dipyridamole-aspirin (AGGRENOX) 25-200 MG per 12 hr capsule Take 1 capsule by mouth 2 (two) times daily.        . fish oil-omega-3 fatty acids 1000 MG capsule Take 2 g by mouth daily.        Marland Kitchen lisinopril-hydrochlorothiazide (PRINZIDE,ZESTORETIC) 20-12.5 MG per tablet Take 1 tablet by mouth 2 times daily at 12 noon and 4 pm.        . Multiple Vitamin (MULTIVITAMIN) tablet Take 1 tablet by mouth daily.          No results found for this or any previous visit  (from the past 48 hour(s)). No results found.  Review of Systems  Gastrointestinal: Negative for nausea, vomiting, abdominal pain, diarrhea, constipation, blood in stool and melena.    Blood pressure 167/91, pulse 61, temperature 97.9 F (36.6 C), temperature source Oral, resp. rate 18, height 5' 8.5" (1.74 m), weight 185 lb (83.915 kg), SpO2 95.00%. Physical Exam  Constitutional: He appears well-developed and well-nourished.  HENT:  Mouth/Throat: Oropharynx is clear and moist.  Eyes: Conjunctivae are normal. No scleral icterus.  Neck: Neck supple. No thyromegaly present.  Cardiovascular: Normal rate, regular rhythm and normal heart sounds.   No murmur heard. Respiratory: Breath sounds normal.  GI: Soft. He exhibits no distension. There is no tenderness.  Musculoskeletal: He exhibits no edema.  Lymphadenopathy:    He has no cervical adenopathy.  Neurological: He is alert.  Skin: Skin is warm and dry.     Assessment/Plan Average risk screening colonoscopy Procedure  reviewed the patient and he is agreeable.  Bennett Ram U 08/13/2010, 7:34 AM

## 2010-08-20 ENCOUNTER — Encounter (HOSPITAL_COMMUNITY): Payer: Self-pay | Admitting: Internal Medicine

## 2010-08-25 ENCOUNTER — Encounter (INDEPENDENT_AMBULATORY_CARE_PROVIDER_SITE_OTHER): Payer: Self-pay | Admitting: *Deleted

## 2010-11-22 ENCOUNTER — Emergency Department (HOSPITAL_COMMUNITY): Payer: Medicare Other

## 2010-11-22 ENCOUNTER — Inpatient Hospital Stay (HOSPITAL_COMMUNITY)
Admission: EM | Admit: 2010-11-22 | Discharge: 2010-11-26 | DRG: 282 | Disposition: A | Discharge status: Home / Self Care | Payer: Medicare Other | Attending: Cardiology | Admitting: Cardiology

## 2010-11-22 ENCOUNTER — Encounter (HOSPITAL_COMMUNITY): Payer: Self-pay | Admitting: Emergency Medicine

## 2010-11-22 ENCOUNTER — Other Ambulatory Visit: Payer: Self-pay

## 2010-11-22 DIAGNOSIS — I214 Non-ST elevation (NSTEMI) myocardial infarction: Secondary | ICD-10-CM

## 2010-11-22 DIAGNOSIS — E119 Type 2 diabetes mellitus without complications: Secondary | ICD-10-CM | POA: Diagnosis present

## 2010-11-22 DIAGNOSIS — Z8673 Personal history of transient ischemic attack (TIA), and cerebral infarction without residual deficits: Secondary | ICD-10-CM

## 2010-11-22 DIAGNOSIS — E785 Hyperlipidemia, unspecified: Secondary | ICD-10-CM | POA: Diagnosis present

## 2010-11-22 DIAGNOSIS — Z7902 Long term (current) use of antithrombotics/antiplatelets: Secondary | ICD-10-CM

## 2010-11-22 DIAGNOSIS — I10 Essential (primary) hypertension: Secondary | ICD-10-CM | POA: Diagnosis present

## 2010-11-22 DIAGNOSIS — I2582 Chronic total occlusion of coronary artery: Secondary | ICD-10-CM | POA: Diagnosis present

## 2010-11-22 DIAGNOSIS — I2510 Atherosclerotic heart disease of native coronary artery without angina pectoris: Secondary | ICD-10-CM

## 2010-11-22 DIAGNOSIS — E782 Mixed hyperlipidemia: Secondary | ICD-10-CM

## 2010-11-22 LAB — CBC
Hemoglobin: 13.3 g/dL (ref 13.0–17.0)
MCH: 25.8 pg — ABNORMAL LOW (ref 26.0–34.0)
MCHC: 32.8 g/dL (ref 30.0–36.0)
Platelets: 274 10*3/uL (ref 150–400)
RDW: 14.8 % (ref 11.5–15.5)

## 2010-11-22 LAB — CARDIAC PANEL(CRET KIN+CKTOT+MB+TROPI)
CK, MB: 45 ng/mL (ref 0.3–4.0)
Relative Index: 5.7 — ABNORMAL HIGH (ref 0.0–2.5)
Total CK: 793 U/L — ABNORMAL HIGH (ref 7–232)

## 2010-11-22 LAB — BASIC METABOLIC PANEL
Calcium: 9.4 mg/dL (ref 8.4–10.5)
GFR calc non Af Amer: 85 mL/min — ABNORMAL LOW (ref >90)
Glucose, Bld: 155 mg/dL — ABNORMAL HIGH (ref 70–99)
Sodium: 135 mEq/L (ref 135–145)

## 2010-11-22 LAB — PRO B NATRIURETIC PEPTIDE: Pro B Natriuretic peptide (BNP): 550.6 pg/mL — ABNORMAL HIGH (ref 0–125)

## 2010-11-22 MED ORDER — NITROGLYCERIN 0.4 MG SL SUBL
0.4000 mg | SUBLINGUAL_TABLET | SUBLINGUAL | Status: DC | PRN
  Administered 2010-11-22 (×3): 0.4 mg via SUBLINGUAL
  Filled 2010-11-22: qty 25

## 2010-11-22 MED ORDER — ASPIRIN 325 MG PO TABS
325.0000 mg | ORAL_TABLET | ORAL | Status: AC
  Administered 2010-11-22: 325 mg via ORAL
  Filled 2010-11-22: qty 1

## 2010-11-22 MED ORDER — HEPARIN BOLUS VIA INFUSION
4000.0000 [IU] | Freq: Once | INTRAVENOUS | Status: AC
  Administered 2010-11-22: 4000 [IU] via INTRAVENOUS
  Filled 2010-11-22: qty 4000

## 2010-11-22 MED ORDER — NITROGLYCERIN IN D5W 200-5 MCG/ML-% IV SOLN
2.0000 ug/min | INTRAVENOUS | Status: DC
  Administered 2010-11-22: 10 ug/min via INTRAVENOUS
  Administered 2010-11-23: 30 ug/min via INTRAVENOUS
  Filled 2010-11-22 (×3): qty 250

## 2010-11-22 MED ORDER — HEPARIN (PORCINE) IN NACL 100-0.45 UNIT/ML-% IJ SOLN
1900.0000 [IU]/h | INTRAMUSCULAR | Status: DC
  Administered 2010-11-22: 1000 [IU]/h via INTRAVENOUS
  Filled 2010-11-22 (×4): qty 250

## 2010-11-22 NOTE — ED Notes (Signed)
Nitroglycerin increased to 83mcg/min pain a 2

## 2010-11-22 NOTE — ED Provider Notes (Addendum)
History  Scribed for Geoffery Lyons, MD, the patient was seen in room APA05. This chart was scribed by Hillery Hunter.   CSN: [id number] Arrival date & time: 11/22/2010  8:24 PM   First MD Initiated Contact with Patient 11/22/10 2110      Chief Complaint  Patient presents with  . Chest Pain  . Shortness of Breath   Patient is a 75 y.o. male presenting with shortness of breath. The history is provided by the patient.  Shortness of Breath  Associated symptoms include chest pain and shortness of breath.    Steve Ramirez is a 75 y.o. male who presents to the Emergency Department complaining of left-sided chest pain with radiation to left arm that started at 16:00 today (5.5 hours ago). He states this pain felt like indigestion and so he tried taking antacids which did not improve pain. He complains of associated mild shortness of breath and denies nausea and diaphoresis. He denies previous CAD, MI or catheterization. He has taken NTG x2 and ASA 325mg  PO which has improved pain somewhat. He states that he exercises regularly without difficulty and that he had a history of DM which resolved and he stopped taking medication. He has a medical history for GI bleeding.    Past Medical History  Diagnosis Date  . Cancer   . Diabetes mellitus   . Hypertension   . Blood transfusion 2004 (?)  . GIB (gastrointestinal bleeding) in [phone] and 2006    felt to be colonic diverticular bleed; no problems since otc,2006    Past Surgical History  Procedure Date  . Tla 2010  . Colonoscopy 08/13/2010    Procedure: COLONOSCOPY;  Surgeon: Malissa Hippo, MD;  Location: [facility];  Service: Endoscopy;  Laterality: N/A;  3,4,5,6 cervical fusion  No family history on file.  History  Substance Use Topics  . Smoking status: Former Games developer  . Smokeless tobacco: Not on file  . Alcohol Use: No      Review of Systems  Constitutional: Negative for diaphoresis.  Respiratory:  Positive for shortness of breath.   Cardiovascular: Positive for chest pain.  Gastrointestinal: Negative for nausea and blood in stool.  Skin: Negative for color change.  Psychiatric/Behavioral: Negative for confusion.  All other systems reviewed and are negative.    Allergies  Review of patient's allergies indicates no known allergies.  Home Medications   Current Outpatient Rx  Name Route Sig Dispense Refill  . ASPIRIN-DIPYRIDAMOLE 25-200 MG PO CP12 Oral Take 1 capsule by mouth 2 (two) times daily.      Marland Kitchen LISINOPRIL-HYDROCHLOROTHIAZIDE 20-12.5 MG PO TABS Oral Take 1 tablet by mouth 2 times daily at 12 noon and 4 pm.      . ONE-DAILY MULTI VITAMINS PO TABS Oral Take 1 tablet by mouth daily.        BP 154/71  Pulse 88  Temp(Src) 98.8 F (37.1 C) (Oral)  Resp 24  Ht 5' 8.5" (1.74 m)  Wt 190 lb (86.183 kg)  BMI 28.47 kg/m2  SpO2 97%  Physical Exam  Nursing note and vitals reviewed. Constitutional: He is oriented to person, place, and time. He appears well-developed and well-nourished. No distress.  HENT:  Head: Normocephalic and atraumatic.  Eyes: Conjunctivae are normal. Pupils are equal, round, and reactive to light. No scleral icterus.  Neck: Neck supple. No thyromegaly present.  Cardiovascular: Normal rate, regular rhythm and normal heart sounds.   No murmur heard. Pulmonary/Chest: Effort normal and breath sounds normal.  No respiratory distress. He has no wheezes. He has no rales. He exhibits no tenderness.  Abdominal: Soft. Bowel sounds are normal. He exhibits no distension and no mass. There is no tenderness. There is no rebound and no guarding.  Musculoskeletal: He exhibits no edema and no tenderness.  Neurological: He is alert and oriented to person, place, and time.  Skin: Skin is warm and dry. He is not diaphoretic.  Psychiatric: He has a normal mood and affect. His behavior is normal.    ED Course  Procedures   Labs Reviewed  CBC - Abnormal; Notable for the  following:    MCH 25.8 (*)    All other components within normal limits  BASIC METABOLIC PANEL - Abnormal; Notable for the following:    Glucose, Bld 155 (*)    GFR calc non Af Amer 85 (*)    All other components within normal limits  PRO B NATRIURETIC PEPTIDE - Abnormal; Notable for the following:    BNP, POC 550.6 (*)    All other components within normal limits  CARDIAC PANEL(CRET KIN+CKTOT+MB+TROPI) - Abnormal; Notable for the following:    Total CK 793 (*)    CK, MB 45.0 (*)    Troponin I 3.38 (*)    Relative Index 5.7 (*)    All other components within normal limits  PROTIME-INR   Dg Chest Portable 1 View  11/22/2010  *RADIOLOGY REPORT*  Clinical Data: Chest pain.  PORTABLE CHEST - 1 VIEW  Comparison: Chest x-ray 09/29/2010.  Findings: The cardiac silhouette, mediastinal and hilar contours are within normal limits and stable.  The lungs are clear except for minimal streaky bibasilar atelectasis.  No edema or effusions. The bony thorax is intact.  IMPRESSION: Minimal streaky bibasilar atelectasis.  Original Report Authenticated By: P. Loralie Champagne, M.D.     OTHER DATA REVIEWED: Nursing notes, vital signs, and past medical records reviewed.   DIAGNOSTIC STUDIES: Oxygen Saturation is 97% on room air, normal by my interpretation.     ED COURSE / COORDINATION OF CARE: 20:33. Ordered prior to my assessment: Cardiac monitoring ; Pulse oximetry, continuous ; ED EKG (<69mins upon arrival to the ED) ; Saline lock IV ; Draw & Hold Blood RAINBOW ; CBC ; Basic metabolic panel ; BNP (if dyspnea) ; Weigh patient, DD Oxygen therapy ; aspirin tablet 325 mg ; nitroGLYCERIN (NITROSTAT) SL tablet 0.4 mg ; DG Chest Portable 1 View ; Cardiac panel(cret kin+cktot+mb+tropi) ; Protime-INR (if Coumadin Pt), LH DG CHEST PORTABLE 1 VIEW.   21:29. Discussed test findings with patient and spouse at bedside including need to transfer for cardiac catheterization. Patient acknowledges risk and benefits of  treatment and is amenable to treatment plan.   Date: 11/22/2010  Rate: 86  Rhythm: normal sinus rhythm  QRS Axis: Left axis deviation  Intervals: normal  ST/T Wave abnormalities: nonspecific ST changes  Conduction Disutrbances:none  Narrative Interpretation:   Old EKG Reviewed: unchanged     MDM  Enzymes positive.  Will contact Cardiology for transfer, cardiac cath.  Spoke with Dr. Terressa Koyanagi who agrees to accept the patient in transfer.   1. Non-ST elevation MI (NSTEMI)    CRITICAL CARE Performed by: Geoffery Lyons   Total critical care time: 30 minutes.  Critical care time was exclusive of separately billable procedures and treating other patients.  Critical care was necessary to treat or prevent imminent or life-threatening deterioration.  Critical care was time spent personally by me on the following activities: development of  treatment plan with patient and/or surrogate as well as nursing, discussions with consultants, evaluation of patient's response to treatment, examination of patient, obtaining history from patient or surrogate, ordering and performing treatments and interventions, ordering and review of laboratory studies, ordering and review of radiographic studies, pulse oximetry and re-evaluation of patient's condition.  I personally performed the services described in this documentation, which was scribed in my presence. The recorded information has been reviewed and considered.    Geoffery Lyons, MD 11/22/10 1610  Geoffery Lyons, MD 11/22/10 9604  Geoffery Lyons, MD 11/22/10 [phone]

## 2010-11-22 NOTE — ED Notes (Signed)
Patient states woke up around 0300 with indigestion.  States pain has continued all day.  States some shortness of breath and some nausea; states pain radiates to left arm.

## 2010-11-22 NOTE — ED Notes (Signed)
Pt has taken baby aspirin and tums with no relief.

## 2010-11-23 ENCOUNTER — Encounter (HOSPITAL_COMMUNITY): Payer: Self-pay | Admitting: *Deleted

## 2010-11-23 DIAGNOSIS — E119 Type 2 diabetes mellitus without complications: Secondary | ICD-10-CM | POA: Diagnosis present

## 2010-11-23 DIAGNOSIS — I369 Nonrheumatic tricuspid valve disorder, unspecified: Secondary | ICD-10-CM

## 2010-11-23 DIAGNOSIS — I214 Non-ST elevation (NSTEMI) myocardial infarction: Secondary | ICD-10-CM

## 2010-11-23 DIAGNOSIS — I10 Essential (primary) hypertension: Secondary | ICD-10-CM | POA: Diagnosis present

## 2010-11-23 DIAGNOSIS — E785 Hyperlipidemia, unspecified: Secondary | ICD-10-CM | POA: Diagnosis present

## 2010-11-23 DIAGNOSIS — Z8673 Personal history of transient ischemic attack (TIA), and cerebral infarction without residual deficits: Secondary | ICD-10-CM

## 2010-11-23 LAB — GLUCOSE, CAPILLARY
Glucose-Capillary: 147 mg/dL — ABNORMAL HIGH (ref 70–99)
Glucose-Capillary: 139 mg/dL — ABNORMAL HIGH (ref 70–99)
Glucose-Capillary: 119 mg/dL — ABNORMAL HIGH (ref 70–99)

## 2010-11-23 LAB — BASIC METABOLIC PANEL
CO2: 23 mEq/L (ref 19–32)
Calcium: 8.8 mg/dL (ref 8.4–10.5)
GFR calc non Af Amer: 89 mL/min — ABNORMAL LOW (ref >90)
Sodium: 141 mEq/L (ref 135–145)

## 2010-11-23 LAB — CBC
Platelets: 211 10*3/uL (ref 150–400)
RBC: 4.64 MIL/uL (ref 4.22–5.81)
WBC: 7.3 10*3/uL (ref 4.0–10.5)

## 2010-11-23 LAB — CARDIAC PANEL(CRET KIN+CKTOT+MB+TROPI)
Relative Index: 6.4 — ABNORMAL HIGH (ref 0.0–2.5)
Relative Index: 5.7 — ABNORMAL HIGH (ref 0.0–2.5)
Total CK: 738 U/L — ABNORMAL HIGH (ref 7–232)
Total CK: 655 U/L — ABNORMAL HIGH (ref 7–232)
Troponin I: 13.73 ng/mL (ref <0.30)

## 2010-11-23 LAB — HEMOGLOBIN A1C
Hgb A1c MFr Bld: 6.6 % — ABNORMAL HIGH (ref <5.7)
Mean Plasma Glucose: 143 mg/dL — ABNORMAL HIGH (ref <117)

## 2010-11-23 MED ORDER — ASPIRIN 300 MG RE SUPP: 300.0000 mg | RECTAL | Status: DC

## 2010-11-23 MED ORDER — SODIUM CHLORIDE 0.9 % IV SOLN: 1.0000 mL/kg/h | INTRAVENOUS | Status: DC

## 2010-11-23 MED ORDER — SODIUM CHLORIDE 0.9 % IV SOLN
INTRAVENOUS | Status: DC
  Administered 2010-11-23 (×2): via INTRAVENOUS

## 2010-11-23 MED ORDER — HEPARIN BOLUS VIA INFUSION
2600.0000 [IU] | Freq: Once | INTRAVENOUS | Status: AC
  Administered 2010-11-23: 2600 [IU] via INTRAVENOUS
  Filled 2010-11-23: qty 2600

## 2010-11-23 MED ORDER — CLOPIDOGREL BISULFATE 300 MG PO TABS
600.0000 mg | ORAL_TABLET | ORAL | Status: AC
  Administered 2010-11-23: 600 mg via ORAL
  Filled 2010-11-23: qty 2

## 2010-11-23 MED ORDER — MORPHINE SULFATE 4 MG/ML IJ SOLN
INTRAMUSCULAR | Status: AC
  Administered 2010-11-23: 4 mg
  Filled 2010-11-23: qty 1

## 2010-11-23 MED ORDER — SODIUM CHLORIDE 0.9 % IJ SOLN
3.0000 mL | Freq: Two times a day (BID) | INTRAMUSCULAR | Status: DC
  Administered 2010-11-23 (×2): 3 mL via INTRAVENOUS

## 2010-11-23 MED ORDER — LISINOPRIL 10 MG PO TABS
10.0000 mg | ORAL_TABLET | Freq: Every day | ORAL | Status: DC
  Administered 2010-11-23 – 2010-11-25 (×3): 10 mg via ORAL
  Filled 2010-11-23 (×4): qty 1

## 2010-11-23 MED ORDER — NITROGLYCERIN IN D5W 200-5 MCG/ML-% IV SOLN: 2.0000 ug/min | INTRAVENOUS | Status: DC

## 2010-11-23 MED ORDER — CLOPIDOGREL BISULFATE 75 MG PO TABS
75.0000 mg | ORAL_TABLET | ORAL | Status: AC
  Administered 2010-11-24: 75 mg via ORAL
  Filled 2010-11-23: qty 1

## 2010-11-23 MED ORDER — SODIUM CHLORIDE 0.9 % IJ SOLN: 3.0000 mL | INTRAMUSCULAR | Status: DC | PRN

## 2010-11-23 MED ORDER — ASPIRIN EC 325 MG PO TBEC
325.0000 mg | DELAYED_RELEASE_TABLET | Freq: Every day | ORAL | Status: DC
  Administered 2010-11-23 – 2010-11-25 (×2): 325 mg via ORAL
  Filled 2010-11-23 (×4): qty 1

## 2010-11-23 MED ORDER — METOPROLOL TARTRATE 12.5 MG HALF TABLET
12.5000 mg | ORAL_TABLET | Freq: Four times a day (QID) | ORAL | Status: DC
  Administered 2010-11-23 – 2010-11-25 (×8): 12.5 mg via ORAL
  Filled 2010-11-23 (×15): qty 1

## 2010-11-23 MED ORDER — CLOPIDOGREL BISULFATE 75 MG PO TABS
75.0000 mg | ORAL_TABLET | Freq: Every day | ORAL | Status: DC
  Administered 2010-11-25 – 2010-11-26 (×2): 75 mg via ORAL
  Filled 2010-11-23 (×4): qty 1

## 2010-11-23 MED ORDER — INSULIN ASPART 100 UNIT/ML ~~LOC~~ SOLN
0.0000 [IU] | Freq: Every day | SUBCUTANEOUS | Status: DC
  Administered 2010-11-24: 0 [IU] via SUBCUTANEOUS

## 2010-11-23 MED ORDER — ASPIRIN 81 MG PO TBEC: 325.0000 mg | DELAYED_RELEASE_TABLET | Freq: Every day | ORAL | Status: DC

## 2010-11-23 MED ORDER — HEPARIN BOLUS VIA INFUSION
1000.0000 [IU] | Freq: Once | INTRAVENOUS | Status: AC
  Administered 2010-11-23: 1000 [IU] via INTRAVENOUS
  Filled 2010-11-23: qty 1000

## 2010-11-23 MED ORDER — ASPIRIN 81 MG PO CHEW: 324.0000 mg | CHEWABLE_TABLET | ORAL | Status: DC

## 2010-11-23 MED ORDER — SODIUM CHLORIDE 0.9 % IV SOLN: 250.0000 mL | INTRAVENOUS | Status: DC

## 2010-11-23 MED ORDER — MORPHINE SULFATE 2 MG/ML IJ SOLN: 1.0000 mg | INTRAMUSCULAR | Status: DC | PRN

## 2010-11-23 MED ORDER — ASPIRIN 81 MG PO CHEW
324.0000 mg | CHEWABLE_TABLET | ORAL | Status: AC
  Administered 2010-11-24: 324 mg via ORAL
  Filled 2010-11-23: qty 4

## 2010-11-23 MED ORDER — SODIUM CHLORIDE 0.9 % IJ SOLN: 3.0000 mL | Freq: Two times a day (BID) | INTRAMUSCULAR | Status: DC

## 2010-11-23 MED ORDER — INSULIN ASPART 100 UNIT/ML ~~LOC~~ SOLN
0.0000 [IU] | Freq: Three times a day (TID) | SUBCUTANEOUS | Status: DC
  Administered 2010-11-23: 3 [IU] via SUBCUTANEOUS
  Administered 2010-11-23: 2 [IU] via SUBCUTANEOUS
  Administered 2010-11-24: 3 [IU] via SUBCUTANEOUS
  Administered 2010-11-25: 2 [IU] via SUBCUTANEOUS
  Administered 2010-11-25: 3 [IU] via SUBCUTANEOUS
  Filled 2010-11-23: qty 3

## 2010-11-23 MED ORDER — ROSUVASTATIN CALCIUM 40 MG PO TABS
40.0000 mg | ORAL_TABLET | Freq: Every day | ORAL | Status: DC
  Administered 2010-11-23 – 2010-11-26 (×4): 40 mg via ORAL
  Filled 2010-11-23 (×4): qty 1

## 2010-11-23 NOTE — ED Notes (Signed)
Pt waiting on [facility].

## 2010-11-23 NOTE — ED Notes (Signed)
Pt ambulated to restroom. 

## 2010-11-23 NOTE — ED Notes (Signed)
Pt refuses any other pain meds, still rates pain a 1. Lights turned down for comfort.

## 2010-11-23 NOTE — Progress Notes (Signed)
Subjective: Comfortable without active chest pain or dyspnea. No nausea or abdominal discomfort.   Objective: Temp:  [98.1 F (36.7 C)-98.8 F (37.1 C)] 98.1 F (36.7 C) (11/11 0232) Pulse Rate:  [66-88] 66  (11/11 0400) Resp:  [12-24] 12  (11/11 0400) BP: (129-154)/(62-77) 133/70 mmHg (11/11 0400) SpO2:  [95 %-99 %] 97 % (11/11 0400) Weight:  [190 lb (86.183 kg)-192 lb 0.3 oz (87.1 kg)] 192 lb 0.3 oz (87.1 kg) (11/11 0500)  I/O last 3 completed shifts: In: 123 [P.O.:50; I.V.:73] Out: -   Telemetry - Sinus rhythm  Exam -   General - NAD  Lungs - CTA, nonlabored  Cardiac - RRR, no rub or gallop  Abdomen - NABS  Extremities - No edema  Testing -   Lab Results  Component Value Date   WBC 7.4 11/22/2010   HGB 13.3 11/22/2010   HCT 40.5 11/22/2010   MCV 78.5 11/22/2010   PLT 274 11/22/2010    Lab Results  Component Value Date   CREATININE 0.83 11/22/2010   BUN 14 11/22/2010   NA 135 11/22/2010   K 3.8 11/22/2010   CL 102 11/22/2010   CO2 19 11/22/2010    Lab Results  Component Value Date   CKTOTAL 793* 11/22/2010   CKMB 45.0* 11/22/2010   TROPONINI 3.38* 11/22/2010    ECG - Sinus rhythm with LAD and NSSTT changes  Current Medications    . aspirin  325 mg Oral Daily  . aspirin  325 mg Oral STAT  . clopidogrel  600 mg Oral Once  . clopidogrel  75 mg Oral Q breakfast  . heparin  4,000 Units Intravenous Once  . insulin aspart  0-15 Units Subcutaneous TID WC  . insulin aspart  0-5 Units Subcutaneous QHS  . lisinopril  10 mg Oral Daily  . metoprolol tartrate  12.5 mg Oral Q6H  . morphine      . rosuvastatin  40 mg Oral Daily  . sodium chloride  3 mL Intravenous Q12H  . DISCONTD: aspirin  324 mg Oral NOW  . DISCONTD: aspirin  300 mg Rectal NOW    Assessment:  1. NSTEMI - Currently stable, troponin I trend pending. ECG nonspecific. TIMI risk score 5.  2. Type 2 diabetes mellitus - diet managed as outpatient. On SSI for now. HgbA1C pending. May  need oral agent ultimately.  3. HTN.  4. Hyperlipidemia - on Crestor. FLP pending.  5. History of TIA.  6. History of GIB 2006 - diverticulosis. Hgb stable.   Plan:  Continue current medical therapy with plan for cardiac catheterization tomorrow for definition of coronary anatomy and assessment of revascularization options. Check HgbA1c and FLP. Check CBC, BMET, ECG AM.

## 2010-11-23 NOTE — Progress Notes (Signed)
*  PRELIMINARY RESULTS* Echocardiogram 2D Echocardiogram has been performed.  Clide Deutscher RDCS 11/23/2010, 11:27 AM

## 2010-11-23 NOTE — ED Notes (Signed)
Report given to [facility] 

## 2010-11-23 NOTE — H&P (Addendum)
Physician History and Physical    Steve Ramirez MRN: [id number] DOB/AGE: [birth year] 75 y.o. Admit date: 11/22/2010  Primary Care Physician: Assunta Found Primary Cardiologist: None  HPI: 75 yo with history of TIA x 3, HTN, and hyperlipidemia presented with NSTEMI last night to [facility]  Patient woke up with central substernal chest burning at 4 am yesterday morning.  The pain lasted all day long.  He thought it was heartburn.  It did not seem to get worse with activity but never resolved.  He went to dinner last night in [place], and after dinner, when the pain was still constant, he decided to go to [facility].  In the ER, he was found to have elevated cardiac enzymes and was admitted.  Currently, he has "1/10" pain that is much improved from initial arrival in ER.  He is on NTG and heparin gtts.    Patient denies prior chest pain.  He has had 3 TIAs and was on Aggrenox.  He has had type II diabetes but lost a lot of weight and no longer is on medications.  He works out at a gym 3 times a week doing Education administrator and elliptical.  He denies exertional dyspnea.  No prior cardiac history.  Nonsmoker.  A number of male relatives have had heart attacks.   Review of systems complete and found to be negative unless listed above   FH: Father and 5 of his brothers had MIs in their early 33s.   SH: Married, lives in [place].  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  Several children.  Nonsmoker, rare ETOH.    PMH:  1. TIA x 3.  2. HTN 3. Hyperlipidemia 4. DM2: No longer on medication with weight loss.  5. History of melanoma 6. History of GI bleed, last in 2006. Thought to be due to diverticulosis.  7. Echo (5/11): EF 60-65%, mild LVH, mild AI, mild MR.   Prescriptions prior to admission  Medication Sig Dispense Refill  . cetirizine (ZYRTEC) 5 MG tablet Take 5 mg by mouth daily.        Marland Kitchen dipyridamole-aspirin (AGGRENOX) 25-200 MG per 12 hr capsule Take 1  capsule by mouth 2 (two) times daily.        Marland Kitchen lisinopril-hydrochlorothiazide (PRINZIDE,ZESTORETIC) 20-12.5 MG per tablet Take 1 tablet by mouth 2 times daily at 12 noon and 4 pm.        . Multiple Vitamin (MULTIVITAMIN) tablet Take 1 tablet by mouth daily.          Physical Exam: Blood pressure 133/70, pulse 66, temperature 98.1 F (36.7 C), temperature source Oral, resp. rate 12, height 5' 8.5" (1.74 m), weight 87.1 kg (192 lb 0.3 oz), SpO2 97.00%.  General: NAD Neck: No JVD, no thyromegaly or thyroid nodule.  Lungs: Clear to auscultation bilaterally with normal respiratory effort. CV: Nondisplaced PMI.  Heart regular S1/S2, no S3/S4, no murmur.  No peripheral edema.  No carotid bruit.  Normal pedal pulses.  Abdomen: Soft, nontender, no hepatosplenomegaly, no distention.  Skin: Intact without lesions or rashes.  Neurologic: Alert and oriented x 3.  Psych: Normal affect. Extremities: No clubbing or cyanosis.  HEENT: Normal.   Labs:   Lab Results  Component Value Date   WBC 7.4 11/22/2010   HGB 13.3 11/22/2010   HCT 40.5 11/22/2010   MCV 78.5 11/22/2010   PLT 274 11/22/2010    Lab 11/22/10 2015  NA 135  K 3.8  CL 102  CO2 19  BUN 14  CREATININE 0.83  CALCIUM 9.4  PROT --  BILITOT --  ALKPHOS --  ALT --  AST --  GLUCOSE 155*   Lab Results  Component Value Date   CKTOTAL 793* 11/22/2010   CKMB 45.0* 11/22/2010   TROPONINI 3.38* 11/22/2010    Pro-BNP 550  Radiology: No PNA or edema.  EKG: NSR, 1/2 mm ST depression lateral leads, LAFB, Qs in III/AVF without ST elevation.   ASSESSMENT AND PLAN: 75 yo with history of HTN and TIA x 3 presented with NSTEMI.   1. NSTEMI: Chest pain now 1/10 (much better).  Began early yesterday morning with chest pain for nearly 24 hours. ECG with lateral ST depression and Qs in III and AVF but no ST elevation. No prior cardiac history.  - Cycle CEs to peak, cycle ECGs (do both now).  - ASA 325, heparin gtt - History of TIAs so will  load with Plavix 600 mg this morning then 75 mg daily.  - Metoprolol 12.5 mg q6 hrs, NTG gtt, morphine prn - Crestor 40.  - Continue lisinopril.  - Echo to assess LV function.  - Left heart cath Monday am if he remains CP-free.  2. Diabetes: Not on meds.  Will follow CBGs and treat with insulin sliding scale.   Signed: Marca Ancona 11/23/2010, 6:46 AM Co-Sign MD  Patient examined chart reviewed prior to cath. Date of Initial H&P: 11/23/10  History reviewed, patient examined, no change in status, stable for procedure.  Steve Ramirez 8:09 AM 11/24/10

## 2010-11-23 NOTE — Progress Notes (Signed)
ANTICOAGULATION CONSULT NOTE - Initial Consult  Pharmacy Consult for Heparin Indication: chest pain/ACS  No Known Allergies  Patient Measurements: Height: 5' 8.5" (174 cm) Weight: 192 lb 0.3 oz (87.1 kg) IBW/kg (Calculated) : 69.55   Vital Signs: Temp: 97.9 F (36.6 C) (11/11 0800) Temp src: Oral (11/11 0800) BP: 124/54 mmHg (11/11 1200) Pulse Rate: 71  (11/11 1200)  Labs:  Basename 11/23/10 1209 11/23/10 0904 11/22/10 2015  HGB -- 12.1* 13.3  HCT -- 36.3* 40.5  PLT -- 211 274  APTT -- -- --  LABPROT -- -- 14.5  INR -- -- 1.11  HEPARINUNFRC -- <0.10* --  CREATININE -- 0.75 0.83  CKTOTAL 655* 738* 793*  CKMB 37.5* 46.9* 45.0*  TROPONINI 13.73* 15.32* 3.38*   Estimated Creatinine Clearance: 89.1 ml/min (by C-G formula based on Cr of 0.75).  Medical History: Past Medical History  Diagnosis Date  . Cancer   . Diabetes mellitus   . Hypertension   . Blood transfusion 2004 (?)  . GIB (gastrointestinal bleeding) in [phone] and 2006    felt to be colonic diverticular bleed; no problems since otc,2006    Medications:  Prescriptions prior to admission  Medication Sig Dispense Refill  . cetirizine (ZYRTEC) 5 MG tablet Take 5 mg by mouth daily.        Marland Kitchen dipyridamole-aspirin (AGGRENOX) 25-200 MG per 12 hr capsule Take 1 capsule by mouth 2 (two) times daily.        Marland Kitchen lisinopril-hydrochlorothiazide (PRINZIDE,ZESTORETIC) 20-12.5 MG per tablet Take 1 tablet by mouth 2 times daily at 12 noon and 4 pm.        . Multiple Vitamin (MULTIVITAMIN) tablet Take 1 tablet by mouth daily.          Assessment: Patient presented to [facility] ED with SOB/CP.  Started on Heparin drip 1000 uts/hr after bolus 4000 uts  Iv x1.  Transferred to [facility] for w/u and positive cardiac enzymes.  He is currently pain free.   Heparin level < 0.1 about 8hr after heparin started ASA,clop, lisinopril,metoprolol, statin Goal of Therapy:  Heparin level 0.3-0.7 units/ml   Plan:  Increase heparin drip 1250  uts/hr and bolus 1000 uts IV x1 Will check 6hr HL and daily Marcelino Scot 11/23/2010,1:48 PM

## 2010-11-23 NOTE — Progress Notes (Signed)
ANTICOAGULATION CONSULT NOTE - Follow Up Consult  Pharmacy Consult for heparin Indication: chest pain/ACS  No Known Allergies  Patient Measurements: Height: 5' 8.5" (174 cm) Weight: 192 lb 0.3 oz (87.1 kg) IBW/kg (Calculated) : 69.55   Vital Signs: Temp: 99 F (37.2 C) (11/11 1936) Temp src: Oral (11/11 1936) BP: 126/61 mmHg (11/11 1900) Pulse Rate: 76  (11/11 1900)  Labs:  Basename 11/23/10 1821 11/23/10 1209 11/23/10 0904 11/22/10 2015  HGB -- -- 12.1* 13.3  HCT -- -- 36.3* 40.5  PLT -- -- 211 274  APTT -- -- -- --  LABPROT -- -- -- 14.5  INR -- -- -- 1.11  HEPARINUNFRC <0.10* -- <0.10* --  CREATININE -- -- 0.75 0.83  CKTOTAL -- 655* 738* 793*  CKMB -- 37.5* 46.9* 45.0*  TROPONINI -- 13.73* 15.32* 3.38*   Estimated Creatinine Clearance: 89.1 ml/min (by C-G formula based on Cr of 0.75).   Medications:  Infusions:    . sodium chloride 10 mL/hr at 11/23/10 1900  . sodium chloride    . sodium chloride    . heparin 12.5 mL/hr (11/23/10 1900)  . nitroGLYCERIN 30 mcg/min (11/23/10 1900)  . DISCONTD: sodium chloride    . DISCONTD: nitroGLYCERIN      Assessment: Pt admitted with CP/SOB. Heparin level is undetectable.  Goal of Therapy:  Heparin level 0.3-0.7 units/ml   Plan: Heparin bolus 2600 units IV x 1, increase gtt to 1500unit/hr, will check an 8 hour heparin level       Mairim Bade, Drake Leach 11/23/2010,8:28 PM

## 2010-11-24 ENCOUNTER — Encounter (HOSPITAL_COMMUNITY)
Admission: EM | Disposition: A | Discharge status: Home / Self Care | Payer: Self-pay | Source: Home / Self Care | Attending: Cardiology

## 2010-11-24 DIAGNOSIS — I2510 Atherosclerotic heart disease of native coronary artery without angina pectoris: Secondary | ICD-10-CM

## 2010-11-24 HISTORY — PX: LEFT HEART CATHETERIZATION WITH CORONARY ANGIOGRAM: SHX5451

## 2010-11-24 LAB — BASIC METABOLIC PANEL
BUN: 12 mg/dL (ref 6–23)
Calcium: 9 mg/dL (ref 8.4–10.5)
GFR calc non Af Amer: 85 mL/min — ABNORMAL LOW (ref >90)
Glucose, Bld: 134 mg/dL — ABNORMAL HIGH (ref 70–99)
Sodium: 140 mEq/L (ref 135–145)

## 2010-11-24 LAB — GLUCOSE, CAPILLARY
Glucose-Capillary: 136 mg/dL — ABNORMAL HIGH (ref 70–99)
Glucose-Capillary: 107 mg/dL — ABNORMAL HIGH (ref 70–99)
Glucose-Capillary: 108 mg/dL — ABNORMAL HIGH (ref 70–99)
Glucose-Capillary: 172 mg/dL — ABNORMAL HIGH (ref 70–99)
Glucose-Capillary: 85 mg/dL (ref 70–99)
Glucose-Capillary: 148 mg/dL — ABNORMAL HIGH (ref 70–99)

## 2010-11-24 LAB — LIPID PANEL
Cholesterol: 121 mg/dL (ref 0–200)
HDL: 31 mg/dL — ABNORMAL LOW
LDL Cholesterol: 66 mg/dL (ref 0–99)
Total CHOL/HDL Ratio: 3.9 ratio
Triglycerides: 122 mg/dL
VLDL: 24 mg/dL (ref 0–40)

## 2010-11-24 LAB — CBC
HCT: 35.9 % — ABNORMAL LOW (ref 39.0–52.0)
Hemoglobin: 11.9 g/dL — ABNORMAL LOW (ref 13.0–17.0)
MCH: 25.9 pg — ABNORMAL LOW (ref 26.0–34.0)
MCHC: 33.1 g/dL (ref 30.0–36.0)

## 2010-11-24 SURGERY — LEFT HEART CATHETERIZATION WITH CORONARY ANGIOGRAM
Anesthesia: LOCAL

## 2010-11-24 SURGERY — LEFT HEART CATH
Anesthesia: Moderate Sedation | Laterality: Left

## 2010-11-24 MED ORDER — ASPIRIN 325 MG PO TBEC: 325.0000 mg | DELAYED_RELEASE_TABLET | Freq: Every day | ORAL | Status: DC

## 2010-11-24 MED ORDER — MIDAZOLAM HCL 2 MG/2ML IJ SOLN
INTRAMUSCULAR | Status: AC
  Filled 2010-11-24: qty 2

## 2010-11-24 MED ORDER — LISINOPRIL 10 MG PO TABS: 10.0000 mg | ORAL_TABLET | Freq: Every day | ORAL | Status: DC

## 2010-11-24 MED ORDER — NITROGLYCERIN 0.2 MG/ML ON CALL CATH LAB
INTRAVENOUS | Status: AC
  Filled 2010-11-24: qty 1

## 2010-11-24 MED ORDER — HEPARIN SODIUM (PORCINE) 1000 UNIT/ML IJ SOLN
INTRAMUSCULAR | Status: AC
  Filled 2010-11-24: qty 1

## 2010-11-24 MED ORDER — DIAZEPAM 2 MG PO TABS: 2.0000 mg | ORAL_TABLET | ORAL | Status: DC | PRN

## 2010-11-24 MED ORDER — VERAPAMIL HCL 2.5 MG/ML IV SOLN
INTRAVENOUS | Status: AC
  Filled 2010-11-24: qty 2

## 2010-11-24 MED ORDER — ONDANSETRON HCL 4 MG/2ML IJ SOLN: 4.0000 mg | Freq: Four times a day (QID) | INTRAMUSCULAR | Status: DC | PRN

## 2010-11-24 MED ORDER — SODIUM CHLORIDE 0.9 % IV SOLN: 250.0000 mL | INTRAVENOUS | Status: DC

## 2010-11-24 MED ORDER — ACETAMINOPHEN 325 MG PO TABS: 650.0000 mg | ORAL_TABLET | ORAL | Status: DC | PRN

## 2010-11-24 MED ORDER — LIDOCAINE HCL (PF) 1 % IJ SOLN
INTRAMUSCULAR | Status: AC
  Filled 2010-11-24: qty 30

## 2010-11-24 MED ORDER — HEPARIN (PORCINE) IN NACL 2-0.9 UNIT/ML-% IJ SOLN
INTRAMUSCULAR | Status: AC
  Filled 2010-11-24: qty 2000

## 2010-11-24 MED ORDER — SODIUM CHLORIDE 0.9 % IJ SOLN: 3.0000 mL | Freq: Two times a day (BID) | INTRAMUSCULAR | Status: DC

## 2010-11-24 MED ORDER — ROSUVASTATIN CALCIUM 40 MG PO TABS: 40.0000 mg | ORAL_TABLET | Freq: Every day | ORAL | Status: DC

## 2010-11-24 MED ORDER — NITROGLYCERIN IN D5W 200-5 MCG/ML-% IV SOLN: 10.0000 ug/min | INTRAVENOUS | Status: DC

## 2010-11-24 MED ORDER — HEPARIN BOLUS VIA INFUSION
3000.0000 [IU] | Freq: Once | INTRAVENOUS | Status: AC
  Administered 2010-11-24: 3000 [IU] via INTRAVENOUS
  Filled 2010-11-24: qty 3000

## 2010-11-24 MED ORDER — OXYCODONE-ACETAMINOPHEN 5-325 MG PO TABS: 1.0000 | ORAL_TABLET | ORAL | Status: DC | PRN

## 2010-11-24 MED ORDER — SODIUM CHLORIDE 0.9 % IV SOLN: 1.0000 mL/kg/h | INTRAVENOUS | Status: DC

## 2010-11-24 MED ORDER — ASPIRIN EC 325 MG PO TBEC: 325.0000 mg | DELAYED_RELEASE_TABLET | Freq: Every day | ORAL | Status: DC

## 2010-11-24 MED ORDER — HEPARIN SODIUM (PORCINE) 5000 UNIT/ML IJ SOLN
5000.0000 [IU] | Freq: Three times a day (TID) | INTRAMUSCULAR | Status: DC
  Administered 2010-11-24 – 2010-11-26 (×5): 5000 [IU] via SUBCUTANEOUS

## 2010-11-24 MED ORDER — SODIUM CHLORIDE 0.9 % IV SOLN
1.0000 mL/kg/h | Freq: Once | INTRAVENOUS | Status: AC
  Administered 2010-11-24: 1 mL/kg/h via INTRAVENOUS

## 2010-11-24 MED ORDER — SODIUM CHLORIDE 0.9 % IJ SOLN: 3.0000 mL | INTRAMUSCULAR | Status: DC | PRN

## 2010-11-24 MED ORDER — SODIUM CHLORIDE 0.9 % IJ SOLN
3.0000 mL | Freq: Two times a day (BID) | INTRAMUSCULAR | Status: DC
  Administered 2010-11-24 – 2010-11-25 (×3): 3 mL via INTRAVENOUS

## 2010-11-24 NOTE — Progress Notes (Signed)
CARDIAC REHAB PHASE I   PRE:  Rate/Rhythm: 69 sr  BP:  Supine:   Sitting: 139/74  Standing:    SaO2:   MODE:  Ambulation: 350 ft   POST:  Rate/Rhythem: 80 sr  BP:  Supine:   Sitting: 139/70  Standing:    SaO2: 100 ra 1335 - 1440 Pt ambulated with 1 assist tolerated well. Education completed with family and verbal understanding. Will forward to [facility]. Reinforced importance of rest and healing.   Rosalie Doctor

## 2010-11-24 NOTE — Progress Notes (Signed)
Subjective:  He denies any chest pain overnight. He had an episode during the day that got better with morphine  Objective:  Vital Signs in the last 24 hours: Temp:  [97.9 F (36.6 C)-99 F (37.2 C)] 98.5 F (36.9 C) (11/12 0400) Pulse Rate:  [64-84] 64  (11/12 0600) Resp:  [7-19] 10  (11/11 2300) BP: (109-136)/(45-88) 134/64 mmHg (11/12 0600) SpO2:  [92 %-98 %] 94 % (11/12 0600)  Intake/Output from previous day: 11/11 0701 - 11/12 0700 In: 446.4 [I.V.:446.4] Out: 800 [Urine:800]  Physical Exam: Pt is alert and oriented, NAD HEENT: normal Neck: JVP - normal, carotids 2+= without bruits Lungs: CTA bilaterally CV: RRR without murmur or gallop Abd: soft, NT, Positive BS, no hepatomegaly Ext: no C/C/E, distal pulses intact and equal Skin: warm/dry no rash   Lab Results:  Basename 11/24/10 0549 11/23/10 0904  WBC 7.8 7.3  HGB 11.9* 12.1*  PLT 192 211    Basename 11/23/10 0904 11/22/10 2015  NA 141 135  K 3.6 3.8  CL 106 102  CO2 23 19  GLUCOSE 182* 155*  BUN 11 14  CREATININE 0.75 0.83    Basename 11/23/10 1209 11/23/10 0904  TROPONINI 13.73* 15.32*     MEDICATIONS: current meds reviewed.    Imaging: 11/10: Minimal streaky bibasilar atelectasis   Cardiac Studies:  EKG ( 11/23/10): sinus rhythm, LAD, non- specific ST- T wave changes .   Assessment/Plan:   1. Acute myocardial infarction, subendocardial infarction:  (11/23/2010)   Assessment: He denies any chest pain, SOB overnight.   Plan:Continue heparin and nitroglycerine gtts.           Continue ASA, plavix, statin, metoprolol, lisinopril.           Planned for cath today . 2.Essential hypertension, benign (11/23/2010)   Assessment: BP stable.   Plan: Continue to monitor. 3.Type 2 diabetes mellitus (11/23/2010):Diet controlled as outpatient.   Assessment:  CBG's well controlled.   Plan: Continue SSI 4.Mixed hyperlipidemia (11/23/2010):  Assessment; Labs pending.   Plan:Continue statin(  crestor). 5. History of TIAs (11/23/2010): He has had 3 TIA's and was on Aggrenox at home.     Plan: Continue Aspirin and Plavix for now.     SAWHNEY,MEGHA, M.D. 11/24/2010, 6:19 AM    Patient seen with resident, agree with note.  No further chest pain.  Cardiac enzymes started trending down.   Current Facility-Administered Medications  Medication Dose Route Frequency Provider Last Rate Last Dose  . 0.9 %  sodium chloride infusion   Intravenous Continuous Marca Ancona, MD 10 mL/hr at 11/23/10 1900    . 0.9 %  sodium chloride infusion  250 mL Intravenous Continuous Marca Ancona, MD      . 0.9 %  sodium chloride infusion  1 mL/kg/hr Intravenous Continuous Marca Ancona, MD 87.1 mL/hr at 11/24/10 0400 1 mL/kg/hr at 11/24/10 0400  . aspirin chewable tablet 324 mg  324 mg Oral Pre-Cath Marca Ancona, MD   324 mg at 11/24/10 0610  . aspirin EC tablet 325 mg  325 mg Oral Daily Elwin Sleight, PHARMD   325 mg at 11/23/10 0911  . clopidogrel (PLAVIX) tablet 600 mg  600 mg Oral STAT Marca Ancona, MD   600 mg at 11/23/10 0827  . clopidogrel (PLAVIX) tablet 75 mg  75 mg Oral Q breakfast Marca Ancona, MD      . clopidogrel (PLAVIX) tablet 75 mg  75 mg Oral Pre-Cath Marca Ancona, MD   75 mg at 11/24/10  1610  . heparin 100 units/mL bolus via infusion 1,000 Units  1,000 Units Intravenous Once Marcelino Scot, PHARMD   1,000 Units at 11/23/10 1300  . heparin 100 units/mL bolus via infusion 2,600 Units  2,600 Units Intravenous Once Drake Leach [place], PHARMD   2,600 Units at 11/23/10 2030  . heparin 100 units/mL bolus via infusion 3,000 Units  3,000 Units Intravenous Once Colleen Can, PHARMD   3,000 Units at 11/24/10 0700  . heparin ADULT infusion 100 units/mL (25000 units/250 mL)  1,900 Units/hr Intravenous Continuous Colleen Can, [place] 19 mL/hr at 11/24/10 0659 1,900 Units/hr at 11/24/10 0659  . insulin aspart (novoLOG) injection 0-15 Units  0-15 Units Subcutaneous TID WC  Marca Ancona, MD   2 Units at 11/23/10 1244  . insulin aspart (novoLOG) injection 0-5 Units  0-5 Units Subcutaneous QHS Marca Ancona, MD      . lisinopril (PRINIVIL,ZESTRIL) tablet 10 mg  10 mg Oral Daily Marca Ancona, MD   10 mg at 11/23/10 0911  . metoprolol tartrate (LOPRESSOR) tablet 12.5 mg  12.5 mg Oral Q6H Marca Ancona, MD   12.5 mg at 11/24/10 0414  . morphine 2 MG/ML injection 1-2 mg  1-2 mg Intravenous Q4H PRN Marca Ancona, MD      . nitroGLYCERIN (NITROSTAT) SL tablet 0.4 mg  0.4 mg Sublingual Q5 min PRN Geoffery Lyons, MD   0.4 mg at 11/22/10 2145  . nitroGLYCERIN 0.2 mg/mL in dextrose 5 % infusion  2-200 mcg/min Intravenous Titrated Marca Ancona, MD 9 mL/hr at 11/23/10 2244 30 mcg/min at 11/23/10 2244  . rosuvastatin (CRESTOR) tablet 40 mg  40 mg Oral Daily Marca Ancona, MD   40 mg at 11/23/10 0912  . sodium chloride 0.9 % injection 3 mL  3 mL Intravenous Q12H Marca Ancona, MD   3 mL at 11/23/10 2114  . sodium chloride 0.9 % injection 3 mL  3 mL Intravenous PRN Marca Ancona, MD      . sodium chloride 0.9 % injection 3 mL  3 mL Intravenous Q12H Marca Ancona, MD      . sodium chloride 0.9 % injection 3 mL  3 mL Intravenous PRN Marca Ancona, MD      . DISCONTD: aspirin EC tablet 325 mg  325 mg Oral Daily Marca Ancona, MD       Continue current meds, going for cath this morning.   Brently Voorhis [employer]

## 2010-11-24 NOTE — Progress Notes (Signed)
ANTICOAGULATION CONSULT NOTE - Follow Up Consult  Pharmacy Consult for heparin Indication: chest pain/ACS  No Known Allergies  Patient Measurements: Height: 5' 8.5" (174 cm) Weight: 192 lb 0.3 oz (87.1 kg) IBW/kg (Calculated) : 69.55   Vital Signs: Temp: 98.5 F (36.9 C) (11/12 0400) Temp src: Oral (11/12 0400) BP: 134/64 mmHg (11/12 0600) Pulse Rate: 64  (11/12 0600)  Labs:  Basename 11/24/10 0549 11/23/10 1821 11/23/10 1209 11/23/10 0904 11/22/10 2015  HGB 11.9* -- -- 12.1* --  HCT 35.9* -- -- 36.3* 40.5  PLT 192 -- -- 211 274  APTT -- -- -- -- --  LABPROT -- -- -- -- 14.5  INR -- -- -- -- 1.11  HEPARINUNFRC <0.10* <0.10* -- <0.10* --  CREATININE 0.84 -- -- 0.75 0.83  CKTOTAL -- -- 655* 738* 793*  CKMB -- -- 37.5* 46.9* 45.0*  TROPONINI -- -- 13.73* 15.32* 3.38*   Estimated Creatinine Clearance: 84.9 ml/min (by C-G formula based on Cr of 0.84).  Assessment: 75yo male remains undetectable on heparin after rate increase.  Pt not currently on cath schedule.  Goal of Therapy:  Heparin level 0.3-0.7 units/ml   Plan:  Will give bolus of 3000 units followed by rate increase of 4 units/kg/hr to 1900 units/hr and check level in 6hr.         Colleen Can PharmD BCPS 11/24/2010,6:43 AM

## 2010-11-24 NOTE — Op Note (Signed)
Catheterization  Name: Steve Ramirez MRN: [id number] DOB: 1935/09/05  Indication: SEMI over 48 hrs ago  Procedure:  After informed consent and clinical "time out" the right radial artery was prepped and draped in a sterile fashion. Allen's test was documented to be normal both by manual compression and plethosmography prior to cannulation.  A 5Fr hydrophilic sheath was advanced using a .025 nitinol wire. Catheters were advanced into the ascending aortic root with a .035 J wire and fluoroscopic guidance.  Standard JL3.5, and JR4 catheters were used to engage the coronaries.  A standard angled pigtail catheter was used for ventriculography.  Coronary arteries were visualized in orthogonal views using caudal and cranial angulation.  RAO ventriculography was done using 28 cc of contrast.    Medications:   Versed: 3 mg's  Fentanyl: 0 ug's  Heparin: 5,000 units  Verapamil: 3 mg's  Coronary Arteries:  Right dominant with no anomalies  LM: 30% calcified  LAD: proximal- 20%, mid-50%, distal-30%   D1- 70% small vessell  D2- 90%  Circumflex: proximal- 30%, mid- 50%, AV groove- 20%  OM1- 30%  OM2- 30%  RCA: proximal- 30%, mid- 30%, distal- 100% with Left to Right collaterals  PDA- fills well from left to right colalterals   Ventriculography: EF: 55%, inferobasal hypokinesis  Hemodynamics:  Aortic Pressure: 136 66 mmHg  LV Pressure: 149 28  mmHg  Impression:  [place] reviewed with Dr Jearld Pies and Clifton James.  Over 60 hours past pain.  Stable with good EF.  Total RCA with good collaterals.  Diagonal has some ostial disease and intervention here would jeopardize LAD.  Favor initial trial of medical Rx and if recurrent angina consider PCI of D2  At the end of the case a TR band was placed wotj 10cc  with good hemostasis and flow to the hand including the radial aspect of the thumb.

## 2010-11-25 ENCOUNTER — Encounter (HOSPITAL_COMMUNITY): Payer: Self-pay

## 2010-11-25 LAB — BASIC METABOLIC PANEL
CO2: 22 mEq/L (ref 19–32)
Chloride: 109 mEq/L (ref 96–112)
GFR calc Af Amer: >90 mL/min (ref >90)
Potassium: 3.6 mEq/L (ref 3.5–5.1)
Sodium: 140 mEq/L (ref 135–145)

## 2010-11-25 LAB — GLUCOSE, CAPILLARY
Glucose-Capillary: 124 mg/dL — ABNORMAL HIGH (ref 70–99)
Glucose-Capillary: 166 mg/dL — ABNORMAL HIGH (ref 70–99)
Glucose-Capillary: 166 mg/dL — ABNORMAL HIGH (ref 70–99)

## 2010-11-25 LAB — CBC
MCV: 78.2 fL (ref 78.0–100.0)
Platelets: 194 10*3/uL (ref 150–400)
RBC: 4.49 MIL/uL (ref 4.22–5.81)
RDW: 14.8 % (ref 11.5–15.5)
WBC: 6.9 10*3/uL (ref 4.0–10.5)

## 2010-11-25 MED ORDER — METOPROLOL TARTRATE 50 MG PO TABS
50.0000 mg | ORAL_TABLET | Freq: Two times a day (BID) | ORAL | Status: DC
  Administered 2010-11-25 – 2010-11-26 (×3): 50 mg via ORAL
  Filled 2010-11-25 (×4): qty 1

## 2010-11-25 MED ORDER — METFORMIN HCL 500 MG PO TABS
500.0000 mg | ORAL_TABLET | Freq: Every day | ORAL | Status: DC
  Administered 2010-11-26: 500 mg via ORAL
  Filled 2010-11-25 (×2): qty 1

## 2010-11-25 NOTE — Progress Notes (Signed)
Pt ambulating independently without symptoms.  Reiterated angina and NTG and CRPII. All questions answered. Will sign off.  Elissa Lovett Jameson CES, ACSM

## 2010-11-25 NOTE — Plan of Care (Signed)
Problem: Phase II Progression Outcomes Goal: Vascular site scale level 0 - I Vascular Site Scale Level 0: No bruising/bleeding/hematoma Level I (Mild): Bruising/Ecchymosis, minimal bleeding/ooozing, palpable hematoma < 3 cm Level II (Moderate): Bleeding not affecting hemodynamic parameters, pseudoaneurysm, palpable hematoma > 3 cm Outcome: Adequate for Discharge Level 0

## 2010-11-25 NOTE — Progress Notes (Signed)
. Subjective:  He denies any more episodes of chest pain. He states that he walked in the hallway without any problem.  Objective:  Vital Signs in the last 24 hours: Temp:  [98.2 F (36.8 C)-99.5 F (37.5 C)] 98.2 F (36.8 C) (11/13 0000) Pulse Rate:  [66-79] 77  (11/12 2100) BP: (97-167)/(49-100) 142/70 mmHg (11/13 0400) SpO2:  [92 %-100 %] 96 % (11/13 0400) Weight:  [193 lb 9 oz (87.8 kg)] 193 lb 9 oz (87.8 kg) (11/13 0500)  Intake/Output from previous day: 11/12 0701 - 11/13 0700 In: 2110 [P.O.:1210; I.V.:900] Out: 651 [Urine:650; Stool:1]  Physical Exam: Pt is alert and oriented, NAD HEENT: normal Neck: JVP - normal, carotids 2+= without bruits Lungs: CTA bilaterally CV: RRR without murmur or gallop Abd: soft, NT, Positive BS, no hepatomegaly Ext: no C/C/E, distal pulses intact and equal Skin: warm/dry no rash   Lab Results:  Basename 11/24/10 0549 11/23/10 0904  WBC 7.8 7.3  HGB 11.9* 12.1*  PLT 192 211    Basename 11/24/10 0549 11/23/10 0904  NA 140 141  K 3.6 3.6  CL 106 106  CO2 26 23  GLUCOSE 134* 182*  BUN 12 11  CREATININE 0.84 0.75    Basename 11/23/10 1209 11/23/10 0904  TROPONINI 13.73* 15.32*     MEDICATIONS:  Current Facility-Administered Medications  Medication Dose Route Frequency Provider Last Rate Last Dose  . 0.9 %  sodium chloride infusion  1 mL/kg/hr Intravenous Once Marca Ancona, MD 87.9 mL/hr at 11/24/10 1800 1 mL/kg/hr at 11/24/10 1800  . acetaminophen (TYLENOL) tablet 650 mg  650 mg Oral Q4H PRN Wendall Stade, MD      . aspirin EC tablet 325 mg  325 mg Oral Daily Elwin Sleight, [place]   325 mg at 11/23/10 0911  . clopidogrel (PLAVIX) tablet 75 mg  75 mg Oral Q breakfast Marca Ancona, MD      . diazepam (VALIUM) tablet 2 mg  2 mg Oral Q4H PRN Wendall Stade, MD      . heparin 100 units/mL bolus via infusion 3,000 Units  3,000 Units Intravenous Once Colleen Can, PHARMD   3,000 Units at 11/24/10 0700  . heparin 1000  UNIT/ML injection           . heparin 2-0.9 UNIT/ML-% infusion           . heparin injection 5,000 Units  5,000 Units Subcutaneous Q8H Wendall Stade, MD   5,000 Units at 11/25/10 [id number]  . insulin aspart (novoLOG) injection 0-15 Units  0-15 Units Subcutaneous TID WC Marca Ancona, MD   3 Units at 11/24/10 1234  . insulin aspart (novoLOG) injection 0-5 Units  0-5 Units Subcutaneous QHS Marca Ancona, MD   0 Units at 11/24/10 2249  . lisinopril (PRINIVIL,ZESTRIL) tablet 10 mg  10 mg Oral Daily Marca Ancona, MD   10 mg at 11/24/10 1126  . metoprolol tartrate (LOPRESSOR) tablet 12.5 mg  12.5 mg Oral Q6H Marca Ancona, MD   12.5 mg at 11/25/10 0604  . midazolam (VERSED) 2 MG/2ML injection           . midazolam (VERSED) 2 MG/2ML injection           . morphine 2 MG/ML injection 1-2 mg  1-2 mg Intravenous Q4H PRN Marca Ancona, MD      . nitroGLYCERIN (NITROSTAT) SL tablet 0.4 mg  0.4 mg Sublingual Q5 min PRN Geoffery Lyons, MD   0.4 mg at 11/22/10 2145  .  nitroGLYCERIN (NTG ON-CALL) 0.2 mg/mL injection           . ondansetron (ZOFRAN) injection 4 mg  4 mg Intravenous Q6H PRN Wendall Stade, MD      . oxyCODONE-acetaminophen (PERCOCET) 5-325 MG per tablet 1-2 tablet  1-2 tablet Oral Q4H PRN Wendall Stade, MD      . rosuvastatin (CRESTOR) tablet 40 mg  40 mg Oral Daily Marca Ancona, MD   40 mg at 11/24/10 1127  . sodium chloride 0.9 % injection 3 mL  3 mL Intravenous Q12H Marca Ancona, MD   3 mL at 11/24/10 2137  . verapamil (ISOPTIN) 2.5 MG/ML injection           . DISCONTD: aspirin EC tablet 325 mg  325 mg Oral Daily Wendall Stade, MD           Imaging: 11/10: Minimal streaky bibasilar atelectasis   Cardiac Studies:  EKG ( 11/23/10): sinus rhythm, LAD, non- specific ST- T wave changes .  Cath ( 11/24/10): Stable with good EF. Total RCA with good collaterals. Diagonal has some ostial disease and intervention here would jeopardize LAD. Favor initial trial of medical Rx and if recurrent angina  consider PCI of D2   Assessment/Plan:  75 y/o man with PMH significant for HTN, diet controlled diabetes got admitted on 11/10 for chest pain and was ruled in for NSTEMI.   1. Acute myocardial infarction, subendocardial infarction:  (11/23/2010): s/p cath that showed total RCA with good collaterals- medical management is recommended.   Assessment: He denies any chest pain, SOB overnight.   Plan:           Continue ASA, plavix, statin, metoprolol, lisinopril.           2.Essential hypertension, benign (11/23/2010)   Assessment: BP slightly elevated   Plan: We may consider to escalating the dose for his metoprolol.  3.Type 2 diabetes mellitus (11/23/2010):Diet controlled as outpatient.   Assessment:  CBG's well controlled.   Plan: Continue SSI  4.Mixed hyperlipidemia (11/23/2010):  Assessment; His HDL is low with low LDL and total cholestrol  Plan:Continue statin( crestor).  5. History of TIAs (11/23/2010): He has had 3 TIA's and was on Aggrenox at home.     Plan: Continue Aspirin and Plavix for now.  6. Dispo: Transfer to the telemetry bed.     SAWHNEY,MEGHA, M.D. 11/25/2010, 6:22 AM Attending: History reviewed with residents.  Patient is doing very well. BP still running high so will increase beta blocker further.  Transfer to telemetry.  Discussed outpatient cardiac rehab in [place]. Add metformin for diabetes.

## 2010-11-26 DIAGNOSIS — I2510 Atherosclerotic heart disease of native coronary artery without angina pectoris: Secondary | ICD-10-CM

## 2010-11-26 LAB — BASIC METABOLIC PANEL
CO2: 23 mEq/L (ref 19–32)
Calcium: 8.8 mg/dL (ref 8.4–10.5)
Creatinine, Ser: 0.78 mg/dL (ref 0.50–1.35)
GFR calc non Af Amer: 87 mL/min — ABNORMAL LOW (ref >90)
Sodium: 140 mEq/L (ref 135–145)

## 2010-11-26 MED ORDER — LISINOPRIL 20 MG PO TABS: 20.0000 mg | ORAL_TABLET | Freq: Every day | ORAL | Status: DC

## 2010-11-26 MED ORDER — CLOPIDOGREL BISULFATE 75 MG PO TABS: 75.0000 mg | ORAL_TABLET | Freq: Every day | ORAL | Status: DC

## 2010-11-26 MED ORDER — NITROGLYCERIN 0.4 MG SL SUBL: 0.4000 mg | SUBLINGUAL_TABLET | SUBLINGUAL | Status: DC | PRN

## 2010-11-26 MED ORDER — METOPROLOL TARTRATE 50 MG PO TABS: 50.0000 mg | ORAL_TABLET | Freq: Two times a day (BID) | ORAL | Status: DC

## 2010-11-26 MED ORDER — ASPIRIN EC 81 MG PO TBEC
81.0000 mg | DELAYED_RELEASE_TABLET | Freq: Every day | ORAL | Status: DC
  Administered 2010-11-26: 81 mg via ORAL
  Filled 2010-11-26: qty 1

## 2010-11-26 MED ORDER — LISINOPRIL 20 MG PO TABS
20.0000 mg | ORAL_TABLET | Freq: Every day | ORAL | Status: DC
  Administered 2010-11-26: 20 mg via ORAL
  Filled 2010-11-26: qty 1

## 2010-11-26 MED ORDER — ASPIRIN 81 MG PO TBEC: 81.0000 mg | DELAYED_RELEASE_TABLET | Freq: Every day | ORAL | Status: AC

## 2010-11-26 MED ORDER — ROSUVASTATIN CALCIUM 40 MG PO TABS: 40.0000 mg | ORAL_TABLET | Freq: Every day | ORAL | Status: DC

## 2010-11-26 NOTE — Progress Notes (Signed)
Nursing Note. Pt. AVS form and RX given and explained to pt. And wife. Correct use of SL NTG explained. Pt. Verbalized understanding. Follow up appointments discussed along with next dose due of prescribed medications.  11/26/2010.11:27 AM Zaeda Mcferran, Blanchard Kelch

## 2010-11-26 NOTE — Progress Notes (Signed)
[facility]  SUBJECTIVE:No complaints, no chest pain.    Filed Vitals:   11/25/10 1600 11/25/10 1728 11/25/10 2150 11/26/10 0641  BP:  150/82 166/79 159/81  Pulse:  65 64 67  Temp: 97.9 F (36.6 C) 96.9 F (36.1 C) 98.2 F (36.8 C) 98.7 F (37.1 C)  TempSrc: Oral Oral Oral Oral  Resp:  16 18 18   Height:      Weight:    87.1 kg (192 lb 0.3 oz)  SpO2:  92% 96% 100%    Intake/Output Summary (Last 24 hours) at 11/26/10 0819 Last data filed at 11/26/10 0649  Gross per 24 hour  Intake    583 ml  Output    975 ml  Net   -392 ml    LABS: Basic Metabolic Panel:  Basename 11/26/10 0530 11/25/10 0645  NA 140 140  K 3.8 3.6  CL 107 109  CO2 23 22  GLUCOSE 125* 115*  BUN 13 11  CREATININE 0.78 0.75  CALCIUM 8.8 8.8  MG -- --  PHOS -- --   Liver Function Tests: No results found for this basename: AST:2,ALT:2,ALKPHOS:2,BILITOT:2,PROT:2,ALBUMIN:2 in the last 72 hours No results found for this basename: LIPASE:2,AMYLASE:2 in the last 72 hours CBC:  Basename 11/25/10 0645 11/24/10 0549  WBC 6.9 7.8  NEUTROABS -- --  HGB 11.5* 11.9*  HCT 35.1* 35.9*  MCV 78.2 78.2  PLT 194 192   Cardiac Enzymes:  Basename 11/23/10 1209 11/23/10 0904  CKTOTAL 655* 738*  CKMB 37.5* 46.9*  CKMBINDEX -- --  TROPONINI 13.73* 15.32*   BNP: No results found for this basename: POCBNP:3 in the last 72 hours D-Dimer: No results found for this basename: DDIMER:2 in the last 72 hours Hemoglobin A1C:  Basename 11/23/10 0904  HGBA1C 6.6*   Fasting Lipid Panel:  Basename 11/24/10 0500  CHOL 121  HDL 31*  LDLCALC 66  TRIG 161  CHOLHDL 3.9  LDLDIRECT --      PHYSICAL EXAM General: NAD Neck: No JVD, no thyromegaly or thyroid nodule.  Lungs: Clear to auscultation bilaterally with normal respiratory effort. CV: Nondisplaced PMI.  Heart regular S1/S2, no S3/S4, no murmur.  No peripheral edema.  No carotid bruit.  Normal pedal pulses.  Abdomen: Soft, nontender, no  hepatosplenomegaly, no distention.  Neurologic: Alert and oriented x 3.  Psych: Normal affect. Extremities: No clubbing or cyanosis.   ASSESSMENT AND PLAN:  75 yo s/p NSTEMI.   1. CAD: NSTEMI.  Totally occluded RCA with collaterals, no intervention.  Patient now doing well with no chest pain.  EF preserved on echo.  - Continue Plavix, decrease ASA to 81 mg daily. - Continue metoprolol, lisinopril - Continue Crestor.  - Cardiac rehab. 2. DM: Probably does not need med at home, close monitoring.  3. HTN: Increase lisinopril to 20 mg daily.  4. Dispo: d/c home today.  Meds; Plavix 75, ASA 81, Crestor 20, metoprolol 50 bid, lisinopril 20 daily.  Cardiac rehab in [place] Followup [facility] cardiology office in 2 wks.   Dalton [employer]

## 2010-11-26 NOTE — Discharge Summary (Signed)
Patient ID: Steve Ramirez,  MRN: [id number], DOB/AGE: 04-25-1935 75 y.o.  Admit date: 11/22/2010 Discharge date: 11/26/2010  Primary Care Provider: Assunta Found Primary Cardiologist: Nona Dell  Discharge Diagnoses Final: NSTEMI Active Problems:  CAD (coronary artery disease)  Essential hypertension, benign  Type 2 diabetes mellitus  Diet controlled  Mixed hyperlipidemia  History of TIAs   Allergies No Known Allergies  Procedures  11/24/2010 - Cardiac Cath Coronary Arteries:  Right dominant with no anomalies  LM: 30% calcified   LAD: proximal- 20%, mid-50%, distal-30%  D1- 70% small vessell D2- 90%  Circumflex: proximal- 30%, mid- 50%, AV groove- 20%  OM1- 30% OM2- 30%  RCA: proximal- 30%, mid- 30%, distal- 100% with Left to Right collaterals  PDA- fills well from left to right colalterals  Ventriculography: EF: 55%, inferobasal hypokinesis  __________________________  11/23/2010 2D Echocardiogram  Left ventricle: The cavity size was normal. Wall thickness was increased in a pattern of moderate LVH. The estimated ejection fraction was 60%. Wall motion was normal; there were no regional wall motion abnormalities.  History of Present Illness 75 y/o male w/o prior cardiac history who was in his usoh until the day prior to admission when he awoke with sscp/burning @ 4am.  Discomfort lasted all day long and was dismissed as being heartburn.  Ss persisted after dinner that night prompting him to present to [facility] in [place], where his ecg was nonspecific but he was found to have elevated cardiac enzymes.  Pt was placed on heparin and ntg infusions with pain relief and was transferred to [facility] for further eval.  Hospital Course  Pt. R/i for MI, evenutally peaking his CK @ 793, MB @ 46.9, and Ti @ 15.32.  Pt remained pain free over the weekend and underwent diagnostic catheterization no 11/12 revealing a total occlusion of the RCA with otherwise  nonobs and branch vessel dzs.  After film review, it was felt that pt would best be managed medically and he was placed on asa, statin, bb, ace, and plavix therapy.  He has been seen by cardiac rehab and has been ambulating w/o recurrent Ss or limitations.  We plan to d/c him home today in good condition.  Discharge Vitals:  Blood pressure 159/81, pulse 67, temperature 98.7 F (37.1 C), temperature source Oral, resp. rate 18, height 5' 8.5" (1.74 m), weight 192 lb 0.3 oz (87.1 kg), SpO2 100.00%.    Labs: Results for orders placed during the hospital encounter of 11/22/10 (from the past 72 hour(s))  CARDIAC PANEL(CRET KIN+CKTOT+MB+TROPI)     Status: Abnormal   Collection Time   11/23/10 12:09 PM      Component Value Range Comment   Total CK 655 (*) 7 - 232 (U/L)    CK, MB 37.5 (*) 0.3 - 4.0 (ng/mL) CRITICAL VALUE NOTED.  VALUE IS CONSISTENT WITH PREVIOUSLY REPORTED AND CALLED VALUE.   Troponin I 13.73 (*) <0.30 (ng/mL)    Relative Index 5.7 (*) 0.0 - 2.5    GLUCOSE, CAPILLARY     Status: Abnormal   Collection Time   11/23/10 12:41 PM      Component Value Range Comment   Glucose-Capillary 139 (*) 70 - 99 (mg/dL)   GLUCOSE, CAPILLARY     Status: Abnormal   Collection Time   11/23/10  4:52 PM      Component Value Range Comment   Glucose-Capillary 119 (*) 70 - 99 (mg/dL)   HEPARIN LEVEL     Status: Abnormal  Collection Time   11/23/10  6:21 PM      Component Value Range Comment   Heparin Unfractionated <0.10 (*) 0.30 - 0.70 (IU/mL)   GLUCOSE, CAPILLARY     Status: Abnormal   Collection Time   11/23/10  8:58 PM      Component Value Range Comment   Glucose-Capillary 136 (*) 70 - 99 (mg/dL)   LIPID PANEL     Status: Abnormal   Collection Time   11/24/10  5:00 AM      Component Value Range Comment   Cholesterol 121  0 - 200 (mg/dL)    Triglycerides 478  <150 (mg/dL)    HDL 31 (*) >29 (mg/dL)    Total CHOL/HDL Ratio 3.9      VLDL 24  0 - 40 (mg/dL)    LDL Cholesterol 66  0 - 99  (mg/dL)   CBC     Status: Abnormal   Collection Time   11/24/10  5:49 AM      Component Value Range Comment   WBC 7.8  4.0 - 10.5 (K/uL)    RBC 4.59  4.22 - 5.81 (MIL/uL)    Hemoglobin 11.9 (*) 13.0 - 17.0 (g/dL)    HCT 56.2 (*) 13.0 - 52.0 (%)    MCV 78.2  78.0 - 100.0 (fL)    MCH 25.9 (*) 26.0 - 34.0 (pg)    MCHC 33.1  30.0 - 36.0 (g/dL)    RDW 86.5  78.4 - 69.6 (%)    Platelets 192  150 - 400 (K/uL)   BASIC METABOLIC PANEL     Status: Abnormal   Collection Time   11/24/10  5:49 AM      Component Value Range Comment   Sodium 140  135 - 145 (mEq/L)    Potassium 3.6  3.5 - 5.1 (mEq/L)    Chloride 106  96 - 112 (mEq/L)    CO2 26  19 - 32 (mEq/L)    Glucose, Bld 134 (*) 70 - 99 (mg/dL)    BUN 12  6 - 23 (mg/dL)    Creatinine, Ser 2.95  0.50 - 1.35 (mg/dL)    Calcium 9.0  8.4 - 10.5 (mg/dL)    GFR calc non Af Amer 85 (*) >90 (mL/min)    GFR calc Af Amer >90  >90 (mL/min)   HEPARIN LEVEL     Status: Abnormal   Collection Time   11/24/10  5:49 AM      Component Value Range Comment   Heparin Unfractionated <0.10 (*) 0.30 - 0.70 (IU/mL)   GLUCOSE, CAPILLARY     Status: Abnormal   Collection Time   11/24/10  9:16 AM      Component Value Range Comment   Glucose-Capillary 107 (*) 70 - 99 (mg/dL)   GLUCOSE, CAPILLARY     Status: Abnormal   Collection Time   11/24/10  9:56 AM      Component Value Range Comment   Glucose-Capillary 108 (*) 70 - 99 (mg/dL)   GLUCOSE, CAPILLARY     Status: Abnormal   Collection Time   11/24/10 12:25 PM      Component Value Range Comment   Glucose-Capillary 172 (*) 70 - 99 (mg/dL)   GLUCOSE, CAPILLARY     Status: Normal   Collection Time   11/24/10  4:23 PM      Component Value Range Comment   Glucose-Capillary 85  70 - 99 (mg/dL)   GLUCOSE, CAPILLARY     Status:  Abnormal   Collection Time   11/24/10  9:52 PM      Component Value Range Comment   Glucose-Capillary 148 (*) 70 - 99 (mg/dL)   CBC     Status: Abnormal   Collection Time    11/25/10  6:45 AM      Component Value Range Comment   WBC 6.9  4.0 - 10.5 (K/uL)    RBC 4.49  4.22 - 5.81 (MIL/uL)    Hemoglobin 11.5 (*) 13.0 - 17.0 (g/dL)    HCT 21.3 (*) 08.6 - 52.0 (%)    MCV 78.2  78.0 - 100.0 (fL)    MCH 25.6 (*) 26.0 - 34.0 (pg)    MCHC 32.8  30.0 - 36.0 (g/dL)    RDW 57.8  46.9 - 62.9 (%)    Platelets 194  150 - 400 (K/uL)   BASIC METABOLIC PANEL     Status: Abnormal   Collection Time   11/25/10  6:45 AM      Component Value Range Comment   Sodium 140  135 - 145 (mEq/L)    Potassium 3.6  3.5 - 5.1 (mEq/L)    Chloride 109  96 - 112 (mEq/L)    CO2 22  19 - 32 (mEq/L)    Glucose, Bld 115 (*) 70 - 99 (mg/dL)    BUN 11  6 - 23 (mg/dL)    Creatinine, Ser 5.28  0.50 - 1.35 (mg/dL)    Calcium 8.8  8.4 - 10.5 (mg/dL)    GFR calc non Af Amer 89 (*) >90 (mL/min)    GFR calc Af Amer >90  >90 (mL/min)   GLUCOSE, CAPILLARY     Status: Abnormal   Collection Time   11/25/10  8:34 AM      Component Value Range Comment   Glucose-Capillary 124 (*) 70 - 99 (mg/dL)   GLUCOSE, CAPILLARY     Status: Normal   Collection Time   11/25/10 11:49 AM      Component Value Range Comment   Glucose-Capillary 95  70 - 99 (mg/dL)   GLUCOSE, CAPILLARY     Status: Abnormal   Collection Time   11/25/10  4:10 PM      Component Value Range Comment   Glucose-Capillary 166 (*) 70 - 99 (mg/dL)   GLUCOSE, CAPILLARY     Status: Abnormal   Collection Time   11/25/10  9:48 PM      Component Value Range Comment   Glucose-Capillary 166 (*) 70 - 99 (mg/dL)   BASIC METABOLIC PANEL     Status: Abnormal   Collection Time   11/26/10  5:30 AM      Component Value Range Comment   Sodium 140  135 - 145 (mEq/L)    Potassium 3.8  3.5 - 5.1 (mEq/L)    Chloride 107  96 - 112 (mEq/L)    CO2 23  19 - 32 (mEq/L)    Glucose, Bld 125 (*) 70 - 99 (mg/dL)    BUN 13  6 - 23 (mg/dL)    Creatinine, Ser 4.13  0.50 - 1.35 (mg/dL)    Calcium 8.8  8.4 - 10.5 (mg/dL)    GFR calc non Af Amer 87 (*) >90 (mL/min)     GFR calc Af Amer >90  >90 (mL/min)   GLUCOSE, CAPILLARY     Status: Abnormal   Collection Time   11/26/10  6:40 AM      Component Value Range Comment   Glucose-Capillary 119 (*)  70 - 99 (mg/dL)    Disposition:  Follow-up Information    Follow up with Joni Reining, NP on 12/10/2010. (1:00pm)    Contact information:   [address] [place] [address] [phone]          Discharge Medications:  Current Discharge Medication List    START taking these medications   Details  aspirin EC 81 MG EC tablet Take 1 tablet (81 mg total) by mouth daily.    clopidogrel (PLAVIX) 75 MG tablet Take 1 tablet (75 mg total) by mouth daily with breakfast. Qty: 30 tablet, Refills: 6    lisinopril (PRINIVIL,ZESTRIL) 20 MG tablet Take 1 tablet (20 mg total) by mouth daily. Qty: 30 tablet, Refills: 6    metoprolol (LOPRESSOR) 50 MG tablet Take 1 tablet (50 mg total) by mouth 2 (two) times daily. Qty: 60 tablet, Refills: 6    nitroGLYCERIN (NITROSTAT) 0.4 MG SL tablet Place 1 tablet (0.4 mg total) under the tongue every 5 (five) minutes as needed for chest pain. Qty: 25 tablet, Refills: 3    rosuvastatin (CRESTOR) 40 MG tablet Take 1 tablet (40 mg total) by mouth daily. Qty: 30 tablet, Refills: 6      CONTINUE these medications which have NOT CHANGED   Details  cetirizine (ZYRTEC) 5 MG tablet Take 5 mg by mouth daily.      Multiple Vitamin (MULTIVITAMIN) tablet Take 1 tablet by mouth daily.        STOP taking these medications     dipyridamole-aspirin (AGGRENOX) 25-200 MG per 12 hr capsule Comments:  Reason for Stopping:       lisinopril-hydrochlorothiazide (PRINZIDE,ZESTORETIC) 20-12.5 MG per tablet Comments:  Reason for Stopping:          Outstanding Labs/Studies:  f/u lipids/lft's in 6-8 wks.  Duration of Discharge Encounter: Greater than 30 minutes including physician time.  Signed, Nicolasa Ducking  NP 11/26/2010, 10:16 AM

## 2010-11-26 NOTE — Progress Notes (Signed)
Utilization review completed. Sherlene Rickel, RN, BSN. 11/26/10 

## 2010-12-10 ENCOUNTER — Ambulatory Visit (INDEPENDENT_AMBULATORY_CARE_PROVIDER_SITE_OTHER): Payer: 59 | Admitting: Adult Health

## 2010-12-10 ENCOUNTER — Encounter: Payer: Self-pay | Admitting: *Deleted

## 2010-12-10 ENCOUNTER — Encounter: Payer: Self-pay | Admitting: Adult Health

## 2010-12-10 VITALS — BP 164/76 | HR 54 | Ht 68.5 in | Wt 191.0 lb

## 2010-12-10 DIAGNOSIS — I10 Essential (primary) hypertension: Secondary | ICD-10-CM

## 2010-12-10 DIAGNOSIS — I2119 ST elevation (STEMI) myocardial infarction involving other coronary artery of inferior wall: Secondary | ICD-10-CM

## 2010-12-10 DIAGNOSIS — I214 Non-ST elevation (NSTEMI) myocardial infarction: Secondary | ICD-10-CM

## 2010-12-10 MED ORDER — LISINOPRIL-HYDROCHLOROTHIAZIDE 20-12.5 MG PO TABS: 1.0000 | ORAL_TABLET | Freq: Every day | ORAL | Status: DC

## 2010-12-10 MED ORDER — METOPROLOL SUCCINATE ER 50 MG PO TB24: 50.0000 mg | ORAL_TABLET | Freq: Every day | ORAL | Status: DC

## 2010-12-10 NOTE — Assessment & Plan Note (Signed)
He is doing well post hospitalization without recurrence of symptoms.  He is slowly becoming more active and wants to get back to working out on [employer]. I have advised him to use treadmill for one more week and then return to eliptical at lower resistance and shorter time, to work himself back up to his pre-MI exertion over one month's time. I will change his metoprolol BID dose to QHS dose and make it long acting to see if this will help his fatigue symptoms. He is advised to refill his NTG Rx every 3-4 months whether it is used or not. Will see him in one month.

## 2010-12-10 NOTE — Progress Notes (Signed)
   HPI:  [name] is a very pleasant 75 y/o patient of Dr. Diona Browner we are seeing on follow-up post STEMI on 11/22/2010 with symptoms of severe heartburn.  Subsequent cardiac cath demonstrated total occlusion of distal RCA with left to right collaterals, 90 % D2 and 70% D1, with nonobstructive disease otherwise.  He was elected to be treated medically unless he has recurrent symptoms. If this occurs plan for intervention of D2.   He has been without complaint and is back working out at [employer], walking on a flat treadmill for 30 minutes. He was using the eliptical trainer, but has stopped this until fully recovered. He is tolerating the medications very well with some complaints of fatigue on metoprolol. No bleeding, chest pain or heartburn.  No Known Allergies  Current Outpatient Prescriptions  Medication Sig Dispense Refill  . aspirin EC 81 MG EC tablet Take 1 tablet (81 mg total) by mouth daily.      . cetirizine (ZYRTEC) 5 MG tablet Take 5 mg by mouth daily.        . clopidogrel (PLAVIX) 75 MG tablet Take 1 tablet (75 mg total) by mouth daily with breakfast.  30 tablet  6  . metoprolol (LOPRESSOR) 50 MG tablet Take 1 tablet (50 mg total) by mouth 2 (two) times daily.  60 tablet  6  . Multiple Vitamin (MULTIVITAMIN) tablet Take 1 tablet by mouth daily.        . nitroGLYCERIN (NITROSTAT) 0.4 MG SL tablet Place 1 tablet (0.4 mg total) under the tongue every 5 (five) minutes as needed for chest pain.  25 tablet  3  . rosuvastatin (CRESTOR) 40 MG tablet Take 1 tablet (40 mg total) by mouth daily.  30 tablet  6    Past Medical History  Diagnosis Date  . Cancer   . Diabetes mellitus   . Hypertension   . Blood transfusion 2004 (?)  . GIB (gastrointestinal bleeding) in [phone] and 2006    felt to be colonic diverticular bleed; no problems since otc,2006    Past Surgical History  Procedure Date  . Tla 2010  . Colonoscopy 08/13/2010    Procedure: COLONOSCOPY;  Surgeon: Malissa Hippo, MD;  Location: [facility];  Service: Endoscopy;  Laterality: N/A;    NFA:OZHYQM of systems complete and found to be negative unless listed above PHYSICAL EXAM BP 164/76  Pulse 54  Ht 5' 8.5" (1.74 m)  Wt 191 lb (86.637 kg)  BMI 28.62 kg/m2  SpO2 96%  General: Well developed, well nourished, in no acute distress Head: Eyes PERRLA, No xanthomas.   Normal cephalic and atramatic  Lungs: Clear bilaterally to auscultation and percussion. Heart: HRRR S1 S2, without MRG.  Pulses are 2+ & equal.            No carotid bruit. No JVD.  No abdominal bruits. No femoral bruits. Abdomen: Bowel sounds are positive, abdomen soft and non-tender without masses or                  Hernia's noted. Msk:  Back normal, normal gait. Normal strength and tone for age. Extremities: No clubbing, cyanosis or edema.  DP +1 Neuro: Alert and oriented X 3. Psych:  Good affect, responds appropriately VHQ:IONGE bradycardia with rate of 55 bpm  Nonspecific ST-T wave abnormality in the infero/lateral leads.  ASSESSMENT AND PLAN

## 2010-12-10 NOTE — Assessment & Plan Note (Signed)
I have rechecked BP here in the exam room and found it to be 130/70.  He complains of mild LEE and hand swelling. He was on low dose HCTZ prior to being admitted at 12.5mg  daily.  I will change his lisinopril dose to include this.  Will follow him in one month for BP evaluation along with a BMET.

## 2010-12-10 NOTE — Patient Instructions (Signed)
Your physician recommends that you schedule a follow-up appointment in: 1 month  Your physician has recommended you make the following change in your medication:  1 - STOP lisinopril  2 - START lisinopril hydrochlorathiazide 20/12.5 mg daily 3 - STOP Metoprolol  4 - START Metoprolol XL 50 mg daily  Your physician recommends that you return for lab work in: 1 month

## 2010-12-31 ENCOUNTER — Other Ambulatory Visit: Payer: Self-pay | Admitting: Adult Health

## 2011-01-01 LAB — BASIC METABOLIC PANEL
BUN: 13 mg/dL (ref 6–23)
CO2: 23 mEq/L (ref 19–32)
Chloride: 105 mEq/L (ref 96–112)
Creat: 0.84 mg/dL (ref 0.50–1.35)
Glucose, Bld: 135 mg/dL — ABNORMAL HIGH (ref 70–99)

## 2011-01-09 ENCOUNTER — Encounter: Payer: Self-pay | Admitting: Adult Health

## 2011-01-09 ENCOUNTER — Ambulatory Visit (INDEPENDENT_AMBULATORY_CARE_PROVIDER_SITE_OTHER): Payer: 59 | Admitting: Adult Health

## 2011-01-09 ENCOUNTER — Other Ambulatory Visit: Payer: Self-pay

## 2011-01-09 DIAGNOSIS — I10 Essential (primary) hypertension: Secondary | ICD-10-CM

## 2011-01-09 DIAGNOSIS — E782 Mixed hyperlipidemia: Secondary | ICD-10-CM

## 2011-01-09 DIAGNOSIS — E7800 Pure hypercholesterolemia, unspecified: Secondary | ICD-10-CM

## 2011-01-09 MED ORDER — CLOPIDOGREL BISULFATE 75 MG PO TABS: 75.0000 mg | ORAL_TABLET | Freq: Every day | ORAL | Status: DC

## 2011-01-09 MED ORDER — METOPROLOL SUCCINATE ER 50 MG PO TB24: 50.0000 mg | ORAL_TABLET | Freq: Every day | ORAL | Status: DC

## 2011-01-09 NOTE — Assessment & Plan Note (Signed)
Follow-up lipids and LFT's will be done in 3 months. He states he is having some muscle soreness in the back of his thighs. I have asked him to stop the Crestor for one week to see if this may be causing the myalgia symptoms. He will call us if symptoms subside. We may need to change to different statin.

## 2011-01-09 NOTE — Progress Notes (Signed)
HPI: Mr.Sigg is a delightful 75 y/o patient of Dr. Diona Browner we are following for ongoing assessment and treatment of known CAD with STEMI on 11/13/2010, with anginal symptoms of severe heartburn. Cardiac cath demonstrated total occlusion of the distal RCA with left to right collaterals, 90% D2, and 70% D1; with nonobstructive disease elsewhere. No intervention was completed on the diagonals unless he was symptomatic. On last visit, he was mildly hypertensive, and I added HCTZ to his medication regimen as he was on this prior to hospital admission. A BMET was completed prior to his coming in.  He is without complaint with the exception of some muscle soreness in the back of his thighs bilaterally. He notices this when he is getting in and out of his car. He denies chest pain or DOE. I changed his toprol to long acting at HS, and this has significantly improved his energy level. He is back at the gym and doing the elliptical trainer 30 minutes a day without symptoms.  No Known Allergies  Current Outpatient Prescriptions  Medication Sig Dispense Refill  . aspirin EC 81 MG EC tablet Take 1 tablet (81 mg total) by mouth daily.      . cetirizine (ZYRTEC) 5 MG tablet Take 5 mg by mouth daily.        . clopidogrel (PLAVIX) 75 MG tablet Take 1 tablet (75 mg total) by mouth daily with breakfast.  30 tablet  6  . lisinopril-hydrochlorothiazide (PRINZIDE,ZESTORETIC) 20-12.5 MG per tablet Take 1 tablet by mouth daily.  30 tablet  12  . metoprolol (TOPROL-XL) 50 MG 24 hr tablet Take 1 tablet (50 mg total) by mouth at bedtime.  30 tablet  12  . Multiple Vitamin (MULTIVITAMIN) tablet Take 1 tablet by mouth daily.        . nitroGLYCERIN (NITROSTAT) 0.4 MG SL tablet Place 1 tablet (0.4 mg total) under the tongue every 5 (five) minutes as needed for chest pain.  25 tablet  3  . Omega-3 Fatty Acids (OMEGA 3 PO) Take by mouth daily.        . rosuvastatin (CRESTOR) 40 MG tablet Take 1 tablet (40 mg total) by mouth  daily.  30 tablet  6    Past Medical History  Diagnosis Date  . Cancer   . Diabetes mellitus   . Hypertension   . Blood transfusion 2004 (?)  . GIB (gastrointestinal bleeding) in [phone] and 2006    felt to be colonic diverticular bleed; no problems since otc,2006    Past Surgical History  Procedure Date  . Tla 2010  . Colonoscopy 08/13/2010    Procedure: COLONOSCOPY;  Surgeon: Malissa Hippo, MD;  Location: [facility];  Service: Endoscopy;  Laterality: N/A;    HQI:ONGEXB of systems complete and found to be negative unless listed above PHYSICAL EXAM BP 139/76  Pulse 65  Resp 18  Ht 5\' 8"  (1.727 m)  Wt 189 lb (85.73 kg)  BMI 28.74 kg/m2  General: Well developed, well nourished, in no acute distress Head: Eyes PERRLA, No xanthomas.   Normal cephalic and atramatic  Lungs: Clear bilaterally to auscultation and percussion. Heart: HRRR S1 S2, without MRG.  Pulses are 2+ & equal.            No carotid bruit. No JVD.  No abdominal bruits. No femoral bruits. Abdomen: Bowel sounds are positive, abdomen soft and non-tender without masses or  Hernia's noted. Msk:  Back normal, normal gait. Normal strength and tone for age. Extremities: No clubbing, cyanosis or edema.  DP +1 Neuro: Alert and oriented X 3. Psych:  Good affect, responds appropriately   ASSESSMENT AND PLAN

## 2011-01-09 NOTE — Assessment & Plan Note (Signed)
He is doing very well, is back to his baseline activity, working out at [employer], energy has improved significantly. He is medically compliant. Will continue current medications. Will have 3 month follow-up.

## 2011-01-09 NOTE — Patient Instructions (Addendum)
**Note De-Identified  Obfuscation** Your physician recommends that you return for lab work in: 3 months  Your physician has recommended you make the following change in your medication: Please do not take Crestor for 1 week, call office to let us know if pain has resolved  Your physician recommends that you schedule a follow-up appointment in: 3 months

## 2011-01-09 NOTE — Assessment & Plan Note (Signed)
Blood pressure is very well controlled with resumption of HCTZ. BMET has been reviewed.Renal function is normal with Creatinine of 0.84, BUN of 13. Will make no further changes at this time.

## 2011-03-05 ENCOUNTER — Other Ambulatory Visit: Payer: Self-pay | Admitting: *Deleted

## 2011-03-05 ENCOUNTER — Encounter: Payer: Self-pay | Admitting: *Deleted

## 2011-03-05 DIAGNOSIS — E782 Mixed hyperlipidemia: Secondary | ICD-10-CM

## 2011-03-12 ENCOUNTER — Telehealth: Payer: Self-pay | Admitting: *Deleted

## 2011-03-12 DIAGNOSIS — E782 Mixed hyperlipidemia: Secondary | ICD-10-CM

## 2011-03-19 LAB — HEPATIC FUNCTION PANEL
ALT: 11 U/L (ref 0–53)
Albumin: 4.4 g/dL (ref 3.5–5.2)
Total Protein: 6.7 g/dL (ref 6.0–8.3)

## 2011-03-19 LAB — LIPID PANEL
Cholesterol: 184 mg/dL (ref 0–200)
LDL Cholesterol: 116 mg/dL — ABNORMAL HIGH (ref 0–99)
Triglycerides: 187 mg/dL — ABNORMAL HIGH (ref <150)

## 2011-04-08 ENCOUNTER — Ambulatory Visit (INDEPENDENT_AMBULATORY_CARE_PROVIDER_SITE_OTHER): Payer: 59 | Admitting: Cardiology

## 2011-04-08 ENCOUNTER — Encounter: Payer: Self-pay | Admitting: Cardiology

## 2011-04-08 VITALS — BP 145/63 | HR 65 | Resp 18 | Ht 68.0 in | Wt 192.0 lb

## 2011-04-08 DIAGNOSIS — E782 Mixed hyperlipidemia: Secondary | ICD-10-CM

## 2011-04-08 DIAGNOSIS — I10 Essential (primary) hypertension: Secondary | ICD-10-CM

## 2011-04-08 DIAGNOSIS — I2510 Atherosclerotic heart disease of native coronary artery without angina pectoris: Secondary | ICD-10-CM

## 2011-04-08 NOTE — Assessment & Plan Note (Signed)
Blood pressure is up today. He states it is typically better when he checks it. I asked him to keep a close eye on this. Further medication adjustment may be necessary.

## 2011-04-08 NOTE — Patient Instructions (Signed)
**Note De-identified  Obfuscation** Your physician recommends that you continue on your current medications as directed. Please refer to the Current Medication list given to you today.  Your physician recommends that you return for lab work in: 3 months, just before next office visit  Your physician recommends that you schedule a follow-up appointment in: 3 months   

## 2011-04-08 NOTE — Progress Notes (Signed)
Clinical Summary Steve Ramirez is a 76 y.o.male presenting for followup. This is my first meeting with him in the office. He has been seen by [name] since hospital discharge with NSTEMI back in November of 2012. He had an occluded distal RCA with left to right collaterals and otherwise mild to moderate multivessel disease that was managed medically.  Recent lab work reviewed showing AST 11, ALT 11, cholesterol 184, triglycerides 187, HDL 31, LDL 116. He was taken off Crestor previously related to bilateral leg pain which he had also experienced previously on Zocor. Not on statin therapy at this time. He prefers not to reinitiate any new medications this time.  He states he has not been walking as regularly or exercising. His wife just had back surgery and he has been helping with her care. He does plan to return to his regular regimen 3 or 4 days a week.  We discussed his diet and exercise, also reviewed his medications. He reports no angina or limiting shortness of breath.   No Known Allergies  Current Outpatient Prescriptions  Medication Sig Dispense Refill  . aspirin EC 81 MG EC tablet Take 1 tablet (81 mg total) by mouth daily.      . cetirizine (ZYRTEC) 5 MG tablet Take 5 mg by mouth daily.        . clopidogrel (PLAVIX) 75 MG tablet Take 1 tablet (75 mg total) by mouth daily with breakfast.  90 tablet  1  . lisinopril-hydrochlorothiazide (PRINZIDE,ZESTORETIC) 20-12.5 MG per tablet Take 1 tablet by mouth daily.  30 tablet  12  . metoprolol (TOPROL-XL) 50 MG 24 hr tablet Take 1 tablet (50 mg total) by mouth at bedtime.  90 tablet  1  . Multiple Vitamin (MULTIVITAMIN) tablet Take 1 tablet by mouth daily.        . nitroGLYCERIN (NITROSTAT) 0.4 MG SL tablet Place 1 tablet (0.4 mg total) under the tongue every 5 (five) minutes as needed for chest pain.  25 tablet  3  . Omega-3 Fatty Acids (OMEGA 3 PO) Take by mouth daily.          Past Medical History  Diagnosis Date  . Coronary  atherosclerosis of native coronary artery   . Type 2 diabetes mellitus   . Essential hypertension, benign   . GIB (gastrointestinal bleeding)     Colonic diverticular bleed  . History of TIAs   . History of melanoma     Past Surgical History  Procedure Date  . Colonoscopy 08/13/2010    Procedure: COLONOSCOPY;  Surgeon: Malissa Hippo, MD;  Location: [facility];  Service: Endoscopy;  Laterality: N/A;    Family History  Problem Relation Age of Onset  . Coronary artery disease Father   . Coronary artery disease Brother     Social History [name] reports that he quit smoking about 31 years ago. His smoking use included Cigarettes. He has never used smokeless tobacco. [name] reports that he does not drink alcohol.  Review of Systems No palpitations, bleeding problems, orthopnea, PND. Otherwise negative.  Physical Examination Filed Vitals:   04/08/11 1529  BP: 145/63  Pulse: 65  Resp:    Overweight male in no acute distress. HEENT: Conjunctiva and lids normal, oropharynx clear. Neck: Supple, no elevated JVP or carotid bruits, no thyromegaly. Lungs: Clear to auscultation, nonlabored breathing at rest. Cardiac: Regular rate and rhythm, no S3 or significant systolic murmur, no pericardial rub. Abdomen: Soft, nontender, bowel sounds present, no guarding  or rebound. Extremities: No pitting edema, distal pulses 2+. Skin: Warm and dry. Musculoskeletal: No kyphosis. Neuropsychiatric: Alert and oriented x3, affect grossly appropriate.    Problem List and Plan

## 2011-04-08 NOTE — Assessment & Plan Note (Signed)
He prefers to stay off any statin medications at this particular time, and work on his diet and exercise. We will recheck fasting lipids for his next visit, and discuss the situation further at that time however.

## 2011-04-08 NOTE — Assessment & Plan Note (Signed)
Symptomatically stable on medical therapy. We discussed diet and exercise. Plans to resume more regular activity within the next few months. Followup arranged.

## 2011-04-09 ENCOUNTER — Ambulatory Visit: Payer: 59 | Admitting: Cardiology

## 2011-06-23 ENCOUNTER — Other Ambulatory Visit: Payer: Self-pay | Admitting: Cardiology

## 2011-06-23 LAB — LIPID PANEL
HDL: 32 mg/dL — ABNORMAL LOW (ref >39)
LDL Cholesterol: 101 mg/dL — ABNORMAL HIGH (ref 0–99)
Total CHOL/HDL Ratio: 5.2 Ratio

## 2011-06-24 ENCOUNTER — Ambulatory Visit: Payer: 59 | Admitting: Cardiology

## 2011-06-26 ENCOUNTER — Encounter: Payer: Self-pay | Admitting: Adult Health

## 2011-06-26 ENCOUNTER — Ambulatory Visit (INDEPENDENT_AMBULATORY_CARE_PROVIDER_SITE_OTHER): Payer: 59 | Admitting: Adult Health

## 2011-06-26 VITALS — BP 185/83 | HR 65 | Resp 16 | Ht 68.0 in | Wt 192.0 lb

## 2011-06-26 DIAGNOSIS — Z8673 Personal history of transient ischemic attack (TIA), and cerebral infarction without residual deficits: Secondary | ICD-10-CM

## 2011-06-26 DIAGNOSIS — I10 Essential (primary) hypertension: Secondary | ICD-10-CM

## 2011-06-26 DIAGNOSIS — I2510 Atherosclerotic heart disease of native coronary artery without angina pectoris: Secondary | ICD-10-CM

## 2011-06-26 MED ORDER — CLOPIDOGREL BISULFATE 75 MG PO TABS: 75.0000 mg | ORAL_TABLET | Freq: Every day | ORAL | Status: DC

## 2011-06-26 MED ORDER — LISINOPRIL-HYDROCHLOROTHIAZIDE 20-12.5 MG PO TABS: 1.0000 | ORAL_TABLET | Freq: Two times a day (BID) | ORAL | Status: DC

## 2011-06-26 MED ORDER — METOPROLOL SUCCINATE ER 50 MG PO TB24: 50.0000 mg | ORAL_TABLET | Freq: Every day | ORAL | Status: DC

## 2011-06-26 NOTE — Assessment & Plan Note (Signed)
Blood pressure is elevated today. He does not take his BP at home. I have rechecked this manually in the exam room and found it to correlate with the triage BP. He has had one episode of a TIA last week lasting approximately 30 minutes without residual effects. Elevated BP may have played a role in this.   I will increase his lisinopril to BID (20/12.5 mg) for better BP control. He will come back next week for BP check and BMET will be completed for kidney fx on ACE and diuretic.

## 2011-06-26 NOTE — Progress Notes (Signed)
HPI:Steve Ramirez is a 76 y/o patient of Dr. Diona Browner we are following for ongoing assessment and treatment of CAD with history of NSTEMI in 2012, with cath showing occluded distal RCA with left to right collaterals. He also has a history of TIA's on Plavix and ASA. He was last seen by Dr. Diona Browner with no changes in his medications at that time. He states that he continues to exercise daily and is without complaint of chest pain or DOE. He is medically compliant.   No Known Allergies  Current Outpatient Prescriptions  Medication Sig Dispense Refill  . aspirin EC 81 MG EC tablet Take 1 tablet (81 mg total) by mouth daily.      . cetirizine (ZYRTEC) 5 MG tablet Take 5 mg by mouth daily.        . clopidogrel (PLAVIX) 75 MG tablet Take 1 tablet (75 mg total) by mouth daily with breakfast.  90 tablet  1  . metoprolol succinate (TOPROL-XL) 50 MG 24 hr tablet Take 1 tablet (50 mg total) by mouth at bedtime.  90 tablet  1  . Multiple Vitamin (MULTIVITAMIN) tablet Take 1 tablet by mouth daily.        . nitroGLYCERIN (NITROSTAT) 0.4 MG SL tablet Place 1 tablet (0.4 mg total) under the tongue every 5 (five) minutes as needed for chest pain.  25 tablet  3  . Omega-3 Fatty Acids (OMEGA 3 PO) Take by mouth daily.        Marland Kitchen DISCONTD: clopidogrel (PLAVIX) 75 MG tablet Take 1 tablet (75 mg total) by mouth daily with breakfast.  90 tablet  1  . DISCONTD: metoprolol (TOPROL-XL) 50 MG 24 hr tablet Take 1 tablet (50 mg total) by mouth at bedtime.  90 tablet  1  . lisinopril-hydrochlorothiazide (ZESTORETIC) 20-12.5 MG per tablet Take 1 tablet by mouth 2 (two) times daily.  180 tablet  1  . DISCONTD: rosuvastatin (CRESTOR) 40 MG tablet Take 1 tablet (40 mg total) by mouth daily.  30 tablet  6    Past Medical History  Diagnosis Date  . Coronary atherosclerosis of native coronary artery   . Type 2 diabetes mellitus   . Essential hypertension, benign   . GIB (gastrointestinal bleeding)     Colonic diverticular  bleed  . History of TIAs   . History of melanoma     Past Surgical History  Procedure Date  . Colonoscopy 08/13/2010    Procedure: COLONOSCOPY;  Surgeon: Malissa Hippo, MD;  Location: [facility];  Service: Endoscopy;  Laterality: N/A;    RUE:AVWUJW of systems complete and found to be negative unless listed above  PHYSICAL EXAM BP 185/83  Pulse 65  Resp 16  Ht 5\' 8"  (1.727 m)  Wt 192 lb (87.091 kg)  BMI 29.19 kg/m2  General: Well developed, well nourished, in no acute distress Head: Eyes PERRLA, No xanthomas.   Normal cephalic and atramatic  Lungs: Clear bilaterally to auscultation and percussion. Heart: HRRR S1 S2, with soft S4 murmur..  Pulses are 2+ & equal.            No carotid bruit. No JVD.  No abdominal bruits. No femoral bruits. Abdomen: Bowel sounds are positive, abdomen soft and non-tender without masses or                  Hernia's noted. Msk:  Back normal, normal gait. Normal strength and tone for age. Extremities: No clubbing, cyanosis or edema.  DP +1 Neuro:  Alert and oriented X 3. Psych:  Good affect, responds appropriately    ASSESSMENT AND PLAN

## 2011-06-26 NOTE — Patient Instructions (Addendum)
**Note De-Identified  Obfuscation** Your physician has recommended you make the following change in your medication: increase Lisinopril/HCTZ 20/12.5 to twice daily  Your physician recommends that you return for lab work in: 1 week  Your physician recommends that you schedule a follow-up appointment in: 1 week for a blood pressure check and in 6 months with provider

## 2011-06-26 NOTE — Assessment & Plan Note (Signed)
Continue medical management and risk factor modification.

## 2011-06-26 NOTE — Assessment & Plan Note (Signed)
He has had one episode within the last week, lasting about 30 minutes. Last carotid study in 2012 showed <20% stenosis bilateral carotid arteries. CT scan 2010 demonstrated stable nonspecific cerebral white matter changes. No acute intracranial abnormality and chronic paranasal sinus inflammatory changes with interval increase.  He will continue on Plavix and ASA. May need neuro consult for evaluation of migraines causing symptoms.

## 2011-07-03 ENCOUNTER — Ambulatory Visit (INDEPENDENT_AMBULATORY_CARE_PROVIDER_SITE_OTHER): Payer: 59 | Admitting: *Deleted

## 2011-07-03 VITALS — BP 212/85 | HR 61 | Ht 68.0 in | Wt 192.0 lb

## 2011-07-03 DIAGNOSIS — I10 Essential (primary) hypertension: Secondary | ICD-10-CM

## 2011-07-03 MED ORDER — BLOOD PRESSURE MONITOR DEVI: 1.0000 | Freq: Once | Status: DC

## 2011-07-03 NOTE — Progress Notes (Signed)
Presents for blood pressure check.  States taking medication as prescribed.  Has not obtained home blood pressures, therefore a list given to him and cuff called into [facility].  Advised him to maintain a low sodium diet, as he states he has been doing and I will call him with further recommendations from provider.

## 2011-07-06 ENCOUNTER — Telehealth: Payer: Self-pay | Admitting: *Deleted

## 2011-07-06 MED ORDER — LISINOPRIL-HYDROCHLOROTHIAZIDE 20-12.5 MG PO TABS: 2.0000 | ORAL_TABLET | Freq: Two times a day (BID) | ORAL | Status: DC

## 2011-07-06 NOTE — Telephone Encounter (Signed)
Patient is already taking 20/12.5 bid.  Please advise.

## 2011-07-06 NOTE — Telephone Encounter (Signed)
Attempted to contact patient with recommendations

## 2011-07-06 NOTE — Telephone Encounter (Signed)
Message copied by Gaynelle Adu on Mon Jul 06, 2011  1:07 PM ------      Message from: Jodelle Gross      Created: Mon Jul 06, 2011  1:01 PM       Increase lisinopril from 20/12.5 to 40/25 mg daily Recheck BP in one week. He has a history of noncompliance.      ----- Message -----         From: Cathren Harsh, RN         Sent: 07/03/2011   4:08 PM           To: Jodelle Gross, NP

## 2011-07-07 ENCOUNTER — Telehealth: Payer: Self-pay | Admitting: *Deleted

## 2011-07-07 NOTE — Telephone Encounter (Signed)
Message copied by Gaynelle Adu on Tue Jul 07, 2011 10:22 AM ------      Message from: Jodelle Gross      Created: Mon Jul 06, 2011  1:01 PM       Increase lisinopril from 20/12.5 to 40/25 mg daily Recheck BP in one week. He has a history of noncompliance.      ----- Message -----         From: Cathren Harsh, RN         Sent: 07/03/2011   4:08 PM           To: Jodelle Gross, NP

## 2011-07-07 NOTE — Telephone Encounter (Signed)
2nd attempt made to contact patient.  Message left for a return call.

## 2011-07-08 ENCOUNTER — Ambulatory Visit (INDEPENDENT_AMBULATORY_CARE_PROVIDER_SITE_OTHER): Payer: 59 | Admitting: Orthopedic Surgery

## 2011-07-08 ENCOUNTER — Other Ambulatory Visit: Payer: Self-pay | Admitting: Orthopedic Surgery

## 2011-07-08 ENCOUNTER — Encounter: Payer: Self-pay | Admitting: Orthopedic Surgery

## 2011-07-08 ENCOUNTER — Ambulatory Visit (INDEPENDENT_AMBULATORY_CARE_PROVIDER_SITE_OTHER): Payer: Self-pay

## 2011-07-08 VITALS — BP 150/70 | Ht 68.0 in | Wt 192.0 lb

## 2011-07-08 DIAGNOSIS — M25559 Pain in unspecified hip: Secondary | ICD-10-CM

## 2011-07-08 DIAGNOSIS — M25552 Pain in left hip: Secondary | ICD-10-CM

## 2011-07-08 NOTE — Progress Notes (Signed)
Subjective:     Patient ID: Steve Ramirez, male   DOB: 06/07/1935, 76 y.o.   MRN: [id number] Chief Complaint  Patient presents with  . Hip Pain    left hip pain, started gradually last year, no known injury    Hip Pain   left hip pain over the greater trochanter For one year, which may have started with the use of a machine at the [facility]. The patient stepped off a curb of intense pain. He also has pain he gets up from a chair. It hurt him last night, although it's been getting better since the machine use. Has stopped. It's a dull, stabbing, 5/10. Intermittent pain after exercise, and has a certain catching   Review of Systems  Constitutional: Negative.   HENT: Negative.   Eyes: Negative.   Respiratory: Positive for cough.   Cardiovascular: Negative.   Genitourinary: Positive for urgency.  Musculoskeletal: Positive for myalgias.  Neurological: Negative.   Hematological: Bruises/bleeds easily.  Psychiatric/Behavioral: Positive for agitation.       Objective:   Physical Exam  Constitutional: He is oriented to person, place, and time. He appears well-developed and well-nourished.  HENT:  Head: Normocephalic.  Neck: Neck supple. No thyromegaly present.  Cardiovascular: Normal rate.   Pulmonary/Chest: Effort normal.  Abdominal: He exhibits no distension.  Musculoskeletal:       Right shoulder: Normal.       Right hip: He exhibits normal range of motion, normal strength, no tenderness, no bony tenderness, no swelling, no crepitus, no deformity and no laceration.       Left hip: Normal. He exhibits normal range of motion, normal strength, no tenderness, no bony tenderness, no swelling, no crepitus, no deformity and no laceration.  Neurological: He is alert and oriented to person, place, and time. He has normal reflexes.  Skin: Skin is warm and dry.  Psychiatric: He has a normal mood and affect. His behavior is normal. Judgment and thought content normal.       Assessment:       X-ray shows some femoral head femoral neck offset, changes, but no osteoarthritis, otherwise, and no joint space narrowing.  Assessment hip pain. I do not, think it's bursitis this has no tenderness over the bursa    Plan:     Advised to stop using the machine at the [facility].  If no improvement workup for impingement syndrome

## 2011-07-08 NOTE — Telephone Encounter (Signed)
Advised patient of recommendations and follow up blood pressure check scheduled for next Wednesday.

## 2011-07-08 NOTE — Patient Instructions (Signed)
Do not use machine

## 2011-07-15 ENCOUNTER — Ambulatory Visit (INDEPENDENT_AMBULATORY_CARE_PROVIDER_SITE_OTHER): Payer: 59 | Admitting: *Deleted

## 2011-07-15 VITALS — BP 121/71 | HR 72

## 2011-07-15 DIAGNOSIS — I10 Essential (primary) hypertension: Secondary | ICD-10-CM

## 2011-07-15 NOTE — Progress Notes (Signed)
Home blood pressures were still elevated, although today's looks much better. Recent medication changes are noted per [name]'s OV note. Continue same and follow blood pressure at home.

## 2011-07-15 NOTE — Progress Notes (Signed)
Presents for blood pressure check.  Home BP have been sub optimal, despite the fact that he is taking medications as prescribed.  No complaints noted.

## 2011-07-28 ENCOUNTER — Telehealth: Payer: Self-pay | Admitting: Cardiology

## 2011-07-28 NOTE — Telephone Encounter (Signed)
No answer and no way to leave message, will continue to call./LV 

## 2011-07-28 NOTE — Telephone Encounter (Signed)
Patient states that he has increased medications as instructed but BP systolic still remains around 409 and 170.  Patient is concerned that BP is still high, please call.

## 2011-07-29 NOTE — Telephone Encounter (Addendum)
BP check is scheduled for 7-18 at 10:15, pt. Aware./LV

## 2011-07-30 ENCOUNTER — Ambulatory Visit (INDEPENDENT_AMBULATORY_CARE_PROVIDER_SITE_OTHER): Payer: 59 | Admitting: *Deleted

## 2011-07-30 ENCOUNTER — Other Ambulatory Visit: Payer: Self-pay | Admitting: *Deleted

## 2011-07-30 VITALS — BP 158/70 | HR 62

## 2011-07-30 DIAGNOSIS — I10 Essential (primary) hypertension: Secondary | ICD-10-CM

## 2011-07-30 LAB — BASIC METABOLIC PANEL
Calcium: 10.3 mg/dL (ref 8.4–10.5)
Glucose, Bld: 121 mg/dL — ABNORMAL HIGH (ref 70–99)
Sodium: 138 mEq/L (ref 135–145)

## 2011-07-30 MED ORDER — AMLODIPINE BESYLATE 5 MG PO TABS: 5.0000 mg | ORAL_TABLET | Freq: Every day | ORAL | Status: DC

## 2011-07-30 NOTE — Progress Notes (Signed)
Per results of lab work and Dr McDowell's recommendation, will continue same dose of lisinopril hct 20/12.5 bid and add Norvasc 5 mg daily.  Pt advised to continue taking blood pressures and report if no changes are noted.  Verbalized understanding.

## 2011-07-30 NOTE — Patient Instructions (Addendum)
Your physician has recommended you make the following change in your medication:  1 - Continue to take lisinopril 20/12.5 twice a day as you have been taking  Your physician recommends that you return for lab work in: BMET today  Continue to monitor blood pressures and return a copy to [place] on Monday  I will call you with recommendations from Dr Diona Browner this afternoon

## 2011-07-30 NOTE — Progress Notes (Signed)
Reviewed. Last note from [name] indicates increase to Lisinopril HCT 20/12.5 mg twice a day from daily. Subsequent phone notes reviewed. Would check BMET and we can go from there. Might need to consider Norvasc.  Jonelle Sidle, M.D., F.A.C.C.

## 2011-07-30 NOTE — Progress Notes (Signed)
Presents with ongoing hypertension.  Has had concerns with this over the past few weeks and is continuing to have systolic readings in 150 - 170 range.  Lisinopril was increased to 2 tabs twice a day by Joni Reining, NP on 6/24, however he has only been taking 1 tablet twice a day.  Has not had a BMET since December, therefore will have done today, as he states he urinates constantly throughout the day.  Advised him to continue same regimen until lab results are reviewed and Dr Diona Browner makes recommendations based on all information.

## 2011-08-06 ENCOUNTER — Telehealth: Payer: Self-pay | Admitting: Cardiology

## 2011-08-06 NOTE — Telephone Encounter (Signed)
Received call from patient advising that after medications changed last Thursday his BP has ranged from 189/70 to 150/84.   Called to speak with nurse.

## 2011-08-06 NOTE — Telephone Encounter (Signed)
It may take more time to see improvement in blood pressure. Focus on sodium restriction in diet less than 2 grams a day, also walking regimen would be useful. We can always increase Norvasc later if needed, but it was just started so would keep same dose for now

## 2011-08-06 NOTE — Telephone Encounter (Signed)
Patient states no change in blood pressures since changes were made on 7/18 to add Norvasc 5 mg to his regimen.  See readings below and advise.

## 2011-08-07 NOTE — Telephone Encounter (Signed)
Recommendations given to patient 

## 2011-09-02 NOTE — Telephone Encounter (Signed)
Change to simvistatin 20 mg daily.  Recheck lipids and LFT's in three months.

## 2011-09-07 NOTE — Telephone Encounter (Signed)
If he cannot tolerate statins, begin Mega Red Fish oil and Welchol 625 mg tablets 3 tablets BiD with meals. Follow up labs in 3 months. IF he doesn't want to take  Welchol, alternative is Zetia, 10 mg daily. Send info on low cholesterol diet.

## 2011-09-07 NOTE — Telephone Encounter (Signed)
Cannot tolerate statins.  Has tried multiple medications with severe side effects.

## 2011-09-09 ENCOUNTER — Encounter: Payer: Self-pay | Admitting: *Deleted

## 2011-09-09 ENCOUNTER — Other Ambulatory Visit: Payer: Self-pay | Admitting: *Deleted

## 2011-09-09 DIAGNOSIS — E782 Mixed hyperlipidemia: Secondary | ICD-10-CM

## 2011-09-09 MED ORDER — COLESEVELAM HCL 625 MG PO TABS: 1875.0000 mg | ORAL_TABLET | Freq: Two times a day (BID) | ORAL | Status: DC

## 2011-09-09 NOTE — Telephone Encounter (Signed)
Recommendations given to patient.  States he will try fish oil and welchol combination and we will follow up with Lipids, as recommended in 3 months.

## 2011-09-09 NOTE — Telephone Encounter (Signed)
Message left for a return call

## 2011-09-25 ENCOUNTER — Other Ambulatory Visit: Payer: Self-pay | Admitting: *Deleted

## 2011-09-25 DIAGNOSIS — E782 Mixed hyperlipidemia: Secondary | ICD-10-CM

## 2011-09-28 ENCOUNTER — Telehealth: Payer: Self-pay | Admitting: Cardiology

## 2011-09-28 MED ORDER — COLESEVELAM HCL 625 MG PO TABS: 1875.0000 mg | ORAL_TABLET | Freq: Two times a day (BID) | ORAL | Status: DC

## 2011-09-28 NOTE — Telephone Encounter (Signed)
PT WAS PUT ON  [facility] AND STATES THAT THERE WAS ONLY ENOUGH CALLED IN FOR 15 DAYS.

## 2011-10-02 ENCOUNTER — Encounter: Payer: Self-pay | Admitting: *Deleted

## 2011-10-05 ENCOUNTER — Telehealth: Payer: Self-pay | Admitting: Cardiology

## 2011-10-05 LAB — LIPID PANEL
Cholesterol: 183 mg/dL (ref 0–200)
Total CHOL/HDL Ratio: 6.5 Ratio

## 2011-10-05 NOTE — Telephone Encounter (Signed)
PT HAD LABS DONE THIS MORNING AND WOULD LIKE RESULTS ASAP/TMJ

## 2011-10-09 ENCOUNTER — Other Ambulatory Visit: Payer: Self-pay | Admitting: *Deleted

## 2011-10-09 ENCOUNTER — Encounter: Payer: Self-pay | Admitting: *Deleted

## 2011-10-09 DIAGNOSIS — E782 Mixed hyperlipidemia: Secondary | ICD-10-CM

## 2011-10-16 ENCOUNTER — Telehealth: Payer: Self-pay | Admitting: Adult Health

## 2011-10-16 NOTE — Telephone Encounter (Signed)
Advised him to obtain lipids, as planned on Oct 16th

## 2011-10-16 NOTE — Telephone Encounter (Signed)
PT GOT HIS LAB REPORT IN THE MAIL AND HAS QUESTIONS ABOUT IT.

## 2011-10-28 ENCOUNTER — Other Ambulatory Visit: Payer: Self-pay | Admitting: Adult Health

## 2011-10-28 LAB — LIPID PANEL: LDL Cholesterol: 86 mg/dL (ref 0–99)

## 2011-11-05 ENCOUNTER — Encounter: Payer: Self-pay | Admitting: Cardiology

## 2011-11-05 DIAGNOSIS — R740 Nonspecific elevation of levels of transaminase and lactic acid dehydrogenase [LDH]: Secondary | ICD-10-CM

## 2011-11-05 DIAGNOSIS — R7401 Elevation of levels of liver transaminase levels: Secondary | ICD-10-CM | POA: Insufficient documentation

## 2011-11-09 ENCOUNTER — Encounter: Payer: Self-pay | Admitting: *Deleted

## 2012-08-05 ENCOUNTER — Encounter (HOSPITAL_COMMUNITY): Payer: Self-pay | Admitting: Emergency Medicine

## 2012-08-05 ENCOUNTER — Emergency Department (HOSPITAL_COMMUNITY): Payer: Medicare Other

## 2012-08-05 ENCOUNTER — Emergency Department (HOSPITAL_COMMUNITY)
Admission: EM | Admit: 2012-08-05 | Discharge: 2012-08-05 | Disposition: A | Discharge status: Home / Self Care | Payer: Medicare Other | Attending: Emergency Medicine | Admitting: Emergency Medicine

## 2012-08-05 DIAGNOSIS — Z85820 Personal history of malignant melanoma of skin: Secondary | ICD-10-CM | POA: Insufficient documentation

## 2012-08-05 DIAGNOSIS — I10 Essential (primary) hypertension: Secondary | ICD-10-CM | POA: Insufficient documentation

## 2012-08-05 DIAGNOSIS — S40019A Contusion of unspecified shoulder, initial encounter: Secondary | ICD-10-CM | POA: Insufficient documentation

## 2012-08-05 DIAGNOSIS — IMO0001 Reserved for inherently not codable concepts without codable children: Secondary | ICD-10-CM

## 2012-08-05 DIAGNOSIS — Y939 Activity, unspecified: Secondary | ICD-10-CM | POA: Insufficient documentation

## 2012-08-05 DIAGNOSIS — E119 Type 2 diabetes mellitus without complications: Secondary | ICD-10-CM | POA: Insufficient documentation

## 2012-08-05 DIAGNOSIS — Z8719 Personal history of other diseases of the digestive system: Secondary | ICD-10-CM | POA: Insufficient documentation

## 2012-08-05 DIAGNOSIS — I2510 Atherosclerotic heart disease of native coronary artery without angina pectoris: Secondary | ICD-10-CM | POA: Insufficient documentation

## 2012-08-05 DIAGNOSIS — Y92009 Unspecified place in unspecified non-institutional (private) residence as the place of occurrence of the external cause: Secondary | ICD-10-CM | POA: Insufficient documentation

## 2012-08-05 DIAGNOSIS — Z79899 Other long term (current) drug therapy: Secondary | ICD-10-CM | POA: Insufficient documentation

## 2012-08-05 DIAGNOSIS — W11XXXA Fall on and from ladder, initial encounter: Secondary | ICD-10-CM | POA: Insufficient documentation

## 2012-08-05 DIAGNOSIS — Z8673 Personal history of transient ischemic attack (TIA), and cerebral infarction without residual deficits: Secondary | ICD-10-CM | POA: Insufficient documentation

## 2012-08-05 DIAGNOSIS — Z87891 Personal history of nicotine dependence: Secondary | ICD-10-CM | POA: Insufficient documentation

## 2012-08-05 MED ORDER — HYDROCODONE-ACETAMINOPHEN 5-325 MG PO TABS: 1.0000 | ORAL_TABLET | Freq: Four times a day (QID) | ORAL | Status: DC | PRN

## 2012-08-05 NOTE — ED Notes (Signed)
Pt states he fell off 76ft ladder x 30 minutes ago. Denies hitting head, denies loc. Denies head/neck/back pain. Pt c/o right shoulder pain.

## 2012-08-05 NOTE — ED Provider Notes (Signed)
CSN: [id number]     Arrival date & time 08/05/12  1156 History  This chart was scribed for Benny Lennert, MD by Bennett Scrape, ED Scribe. This patient was seen in room [facility] and the patient's care was started at 12:10 AM.   First MD Initiated Contact with Patient 08/05/12 1208     Chief Complaint  Patient presents with  . Fall    Patient is a 77 y.o. male presenting with fall. The history is provided by the patient. No language interpreter was used.  Fall This is a new problem. The current episode started less than 1 hour ago. Episode frequency: once. The problem has been resolved. Pertinent negatives include no chest pain, no abdominal pain and no headaches. Nothing aggravates the symptoms. Nothing relieves the symptoms. He has tried nothing for the symptoms.    HPI Comments: Steve Ramirez is a 77 y.o. male who presents to the Emergency Department complaining of a fall that occurred 40 minutes ago. Pt states that he fell off of a 6 ft ladder and hit his right shoulder against his house on the way down. He reports that he was able to get up and ambulate afterwards. He c/o right shoulder pain but denies head trauma, LOC, HA, neck pain and back pain as associated symptoms.    Past Medical History  Diagnosis Date  . Coronary atherosclerosis of native coronary artery   . Type 2 diabetes mellitus   . Essential hypertension, benign   . GIB (gastrointestinal bleeding)     Colonic diverticular bleed  . History of TIAs   . History of melanoma    Past Surgical History  Procedure Laterality Date  . Colonoscopy  08/13/2010    Procedure: COLONOSCOPY;  Surgeon: Malissa Hippo, MD;  Location: [facility];  Service: Endoscopy;  Laterality: N/A;  . Knee surgery     Family History  Problem Relation Age of Onset  . Coronary artery disease Father   . Coronary artery disease Brother   . Asthma     History  Substance Use Topics  . Smoking status: Former Smoker    Types:  Cigarettes    Quit date: 01/13/1980  . Smokeless tobacco: Never Used  . Alcohol Use: No    Review of Systems  Constitutional: Negative for appetite change and fatigue.  HENT: Negative for congestion, sinus pressure and ear discharge.   Eyes: Negative for discharge.  Respiratory: Negative for cough.   Cardiovascular: Negative for chest pain.  Gastrointestinal: Negative for abdominal pain and diarrhea.  Genitourinary: Negative for frequency and hematuria.  Musculoskeletal: Positive for arthralgias (right shoulder). Negative for back pain.  Skin: Negative for rash.  Neurological: Negative for seizures and headaches.  Psychiatric/Behavioral: Negative for hallucinations.    Allergies  Review of patient's allergies indicates no known allergies.  Home Medications   Current Outpatient Rx  Name  Route  Sig  Dispense  Refill  . EXPIRED: amLODipine (NORVASC) 5 MG tablet   Oral   Take 1 tablet (5 mg total) by mouth daily.   30 tablet   12   . Blood Pressure Monitor DEVI   Does not apply   1 Device by Does not apply route once.   1 Device   0   . cetirizine (ZYRTEC) 5 MG tablet   Oral   Take 5 mg by mouth daily.           Marland Kitchen EXPIRED: clopidogrel (PLAVIX) 75 MG tablet   Oral  Take 1 tablet (75 mg total) by mouth daily with breakfast.   90 tablet   1   . colesevelam (WELCHOL) 625 MG tablet   Oral   Take 3 tablets (1,875 mg total) by mouth 2 (two) times daily with a meal.   180 tablet   11   . EXPIRED: metoprolol succinate (TOPROL-XL) 50 MG 24 hr tablet   Oral   Take 1 tablet (50 mg total) by mouth at bedtime.   90 tablet   1   . Multiple Vitamin (MULTIVITAMIN) tablet   Oral   Take 1 tablet by mouth daily.           Marland Kitchen EXPIRED: nitroGLYCERIN (NITROSTAT) 0.4 MG SL tablet   Sublingual   Place 1 tablet (0.4 mg total) under the tongue every 5 (five) minutes as needed for chest pain.   25 tablet   3   . Omega-3 Fatty Acids (OMEGA 3 PO)   Oral   Take by mouth  daily.            Triage Vitals: BP 114/60  Pulse 58  Resp 20  Ht 5' 8.5" (1.74 m)  Wt 188 lb (85.276 kg)  BMI 28.17 kg/m2  SpO2 98%  Physical Exam  Nursing note and vitals reviewed. Constitutional: He is oriented to person, place, and time. He appears well-developed and well-nourished.  HENT:  Head: Normocephalic and atraumatic.  Eyes: Conjunctivae are normal.  Neck: No tracheal deviation present.  Cardiovascular: Normal rate.   Pulmonary/Chest: Effort normal.  Musculoskeletal:  Tender in right shoulder, pain with abduction, neurovascularly intact  Neurological: He is alert and oriented to person, place, and time.  Skin: Skin is warm and dry.  Psychiatric: He has a normal mood and affect. His behavior is normal.    ED Course   Procedures (including critical care time)  DIAGNOSTIC STUDIES: Oxygen Saturation is 98% on room air, normal by my interpretation.    COORDINATION OF CARE: 12:13-Discussed treatment plan which includes xray of the left shoulder with pt at bedside and pt agreed to plan. Pt declined pain medications.   Dg Shoulder Right  08/05/2012   *RADIOLOGY REPORT*  Clinical Data:  Pain post trauma  RIGHT SHOULDER - 2+ VIEW  Comparison: None.  Findings: Internal rotation, external rotation, and Y scapular images were obtained.  No fracture or dislocation.  Joint spaces appear intact.  No erosive change or intra-articular calcifications.  IMPRESSION:  No abnormality noted.   Original Report Authenticated By: Bretta Bang, M.D.    No diagnosis found.  MDM  The chart was scribed for me under my direct supervision.  I personally performed the history, physical, and medical decision making and all procedures in the evaluation of this patient.Benny Lennert, MD 08/05/12 [id number]

## 2012-08-08 ENCOUNTER — Other Ambulatory Visit: Payer: Self-pay | Admitting: *Deleted

## 2012-08-08 MED ORDER — CLOPIDOGREL BISULFATE 75 MG PO TABS: 75.0000 mg | ORAL_TABLET | Freq: Every day | ORAL | Status: DC

## 2012-08-09 ENCOUNTER — Encounter: Payer: Self-pay | Admitting: Orthopedic Surgery

## 2012-08-09 ENCOUNTER — Ambulatory Visit (INDEPENDENT_AMBULATORY_CARE_PROVIDER_SITE_OTHER): Payer: 59 | Admitting: Orthopedic Surgery

## 2012-08-09 VITALS — BP 163/82 | Ht 68.5 in | Wt 189.0 lb

## 2012-08-09 DIAGNOSIS — S43001A Unspecified subluxation of right shoulder joint, initial encounter: Secondary | ICD-10-CM | POA: Insufficient documentation

## 2012-08-09 DIAGNOSIS — S43006A Unspecified dislocation of unspecified shoulder joint, initial encounter: Secondary | ICD-10-CM

## 2012-08-09 NOTE — Progress Notes (Signed)
Patient ID: Steve Ramirez, male   DOB: 1935-11-21, 77 y.o.   MRN: [id number] Chief complaint right shoulder pain since Friday, July 26  This patient is 77 years old he fell off a ladder. 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 He's always had some weakness in the right shoulder. After he fell off the ladder he landed on the right shoulder as a proximally 6 feet. He noticed of the shoulder was out of place again and his wife help back in place when he presented to the ER x-rays show the shoulder reduced. Complains of dull 4/10 intermittent pain which is worse with raising his arm he has pain across the front of the shoulder and around the glenohumeral joint he's improved sling on. He has no other positive on review of systems. He has a history of heart disease and TIAs he had a cervical fusion has a family history of heart disease and diabetes he is married he is now retired  He takes lisinopril 20 mg with 12.5 mg of hydrochlorothiazide twice a day Plavix 75 once a day Multivitamin Zyrtec 10 Baby aspirin Omega-3 rule oil 1 a day Retropatellar ER 50 mg at bedtime Norvasc 5 mg daily Metformin 500 mg twice a day Niacin ER 500 mg one per day with an apple  BP 163/82  Ht 5' 8.5" (1.74 m)  Wt 189 lb (85.73 kg)  BMI 28.32 kg/m2 General appearance is normal he is oriented x3 his mood and affect are normal he ambulates normally no assisted devices none labs  Right arm has atrophy of the triceps muscle. He has painful passive range of motion of the right shoulder which is less than normal stability test was deferred because of pain in recent subluxation episode rotator cuff strength could not be assessed skin was intact pulses good sensation is normal there is no lymphadenopathy  X-rays were negative for acute fracture or dislocation  Impression subluxation with reduction possible  rotator cuff tear  Recommend sling for 5 days start normal activities at home such as grooming and eating at that time and then come back in a week for me to reassess the shoulder for possible rotator cuff tear

## 2012-08-09 NOTE — Patient Instructions (Signed)
Sling x 5 days

## 2012-08-16 ENCOUNTER — Encounter: Payer: Self-pay | Admitting: Orthopedic Surgery

## 2012-08-16 ENCOUNTER — Ambulatory Visit (INDEPENDENT_AMBULATORY_CARE_PROVIDER_SITE_OTHER): Payer: 59 | Admitting: Orthopedic Surgery

## 2012-08-16 VITALS — BP 160/88 | Ht 68.5 in | Wt 189.0 lb

## 2012-08-16 DIAGNOSIS — Z5189 Encounter for other specified aftercare: Secondary | ICD-10-CM

## 2012-08-16 DIAGNOSIS — M75101 Unspecified rotator cuff tear or rupture of right shoulder, not specified as traumatic: Secondary | ICD-10-CM

## 2012-08-16 DIAGNOSIS — S43429A Sprain of unspecified rotator cuff capsule, initial encounter: Secondary | ICD-10-CM

## 2012-08-16 DIAGNOSIS — S43001D Unspecified subluxation of right shoulder joint, subsequent encounter: Secondary | ICD-10-CM

## 2012-08-16 NOTE — Progress Notes (Signed)
Patient ID: Steve Ramirez, male   DOB: 07/14/1935, 77 y.o.   MRN: [id number] Chief Complaint  Patient presents with  . Follow-up    one week recheck right shoulder    Chief complaint right shoulder pain since Friday, July 26  This patient is 77 years old he fell off a ladder. 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 He's always had some weakness in the right shoulder. After he fell off the ladder he landed on the right shoulder as a proximally 6 feet. He noticed of the shoulder was out of place again and his wife help back in place when he presented to the ER x-rays show the shoulder reduced. Complains of dull 4/10 intermittent pain which is worse with raising his arm he has pain across the front of the shoulder and around the glenohumeral joint he's improved sling on. He has no other positive on review of systems. He has a history of heart disease and TIAs he had a cervical fusion has a family history of heart disease and diabetes he is married he is now retired  Followup visit the patient complains of weakness in his right shoulder and inability to brush his teeth or lift his arm above about 80 of forward elevation  Review of systems negative. History of previous cervical disc disease complicated by triceps atrophy chronic long-standing not contributory  BP 160/88  Ht 5' 8.5" (1.74 m)  Wt 189 lb (85.73 kg)  BMI 28.32 kg/m2 General appearance is normal, the patient is alert and oriented x3 with normal mood and affect. There is ecchymosis in the upper arm there is tenderness in the rotator interval is painful for elevation 80 he has a weak external rotators we may have actually torn is infraspinatus he also has weakness in his rotator cuff he has mild impingement sign.  Impression rotator cuff tear right shoulder with underlying previous subluxation dislocation  Recommend MRI  to evaluate for rotator cuff tear possible surgical repair

## 2012-08-16 NOTE — Patient Instructions (Signed)
MRI ordered

## 2012-08-17 ENCOUNTER — Other Ambulatory Visit: Payer: Self-pay

## 2012-08-19 ENCOUNTER — Telehealth: Payer: Self-pay | Admitting: Radiology

## 2012-08-19 NOTE — Telephone Encounter (Signed)
Patient has MRI at [facility] on 08-22-12 at 2:45. Patient has [facility], authorization # [id number] and it expires on 10-01-12. Patient will follow up back here in th eoffice for his results.

## 2012-08-22 ENCOUNTER — Encounter (HOSPITAL_COMMUNITY): Payer: Self-pay

## 2012-08-22 ENCOUNTER — Ambulatory Visit (HOSPITAL_COMMUNITY)
Admission: RE | Admit: 2012-08-22 | Discharge: 2012-08-22 | Disposition: A | Discharge status: Home / Self Care | Payer: Medicare Other | Source: Ambulatory Visit | Attending: Orthopedic Surgery | Admitting: Orthopedic Surgery

## 2012-08-22 DIAGNOSIS — M6790 Unspecified disorder of synovium and tendon, unspecified site: Secondary | ICD-10-CM | POA: Insufficient documentation

## 2012-08-22 DIAGNOSIS — M75101 Unspecified rotator cuff tear or rupture of right shoulder, not specified as traumatic: Secondary | ICD-10-CM

## 2012-08-22 DIAGNOSIS — W19XXXA Unspecified fall, initial encounter: Secondary | ICD-10-CM | POA: Insufficient documentation

## 2012-08-22 DIAGNOSIS — S43429A Sprain of unspecified rotator cuff capsule, initial encounter: Secondary | ICD-10-CM | POA: Insufficient documentation

## 2012-08-22 DIAGNOSIS — M25519 Pain in unspecified shoulder: Secondary | ICD-10-CM | POA: Insufficient documentation

## 2012-08-22 DIAGNOSIS — M719 Bursopathy, unspecified: Secondary | ICD-10-CM | POA: Insufficient documentation

## 2012-08-29 ENCOUNTER — Ambulatory Visit (INDEPENDENT_AMBULATORY_CARE_PROVIDER_SITE_OTHER): Payer: Medicare Other | Admitting: Orthopedic Surgery

## 2012-08-29 VITALS — BP 136/78 | Ht 68.5 in | Wt 189.0 lb

## 2012-08-29 DIAGNOSIS — S43429A Sprain of unspecified rotator cuff capsule, initial encounter: Secondary | ICD-10-CM

## 2012-08-29 DIAGNOSIS — M75101 Unspecified rotator cuff tear or rupture of right shoulder, not specified as traumatic: Secondary | ICD-10-CM

## 2012-08-29 NOTE — Progress Notes (Signed)
Patient ID: Steve Ramirez, male   DOB: [birth year], 77 y.o.   MRN: [id number]  Chief Complaint  Patient presents with  . Results    MRI Results right shoulder   BP 136/78  Ht 5' 8.5" (1.74 m)  Wt 189 lb (85.73 kg)  BMI 28.32 kg/m2 Chief complaint right shoulder pain since Friday, July 26  This patient is 77 years old he fell off a ladder. 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 He's always had some weakness in the right shoulder. After he fell off the ladder he landed on the right shoulder as a proximally 6 feet. He noticed of the shoulder was out of place again and his wife help back in place when he presented to the ER x-rays show the shoulder reduced. Complains of dull 4/10 intermittent pain which is worse with raising his arm he has pain across the front of the shoulder and around the glenohumeral joint he's improved sling on. He has no other positive on review of systems. He has a history of heart disease and TIAs he had a cervical fusion has a family history of heart disease and diabetes he is married he is now retired  Review of systems is negative  The examination reveals well-developed well-nourished male grooming and hygiene are intact he is oriented x3 his mood and affect is normal he has for elevation of 150 he has resistance strength of 5 on the supraspinatus he has limited external rotation resistance strength against internal rotation with the belly press test remained neurovascularly intact he has atrophy in his triceps which is chronic he has minimal pain   MRI FOLLOW UP:  Rotator cuff:  Significant rotator cuff injury with a complete tear of the subscapularis tendon, near complete tear of the supraspinatus tendon and complete tear of the infraspinatus tendon. Some anterior fibers of the supraspinatus tendon are still intact. Muscles:  Edema extending back  into the muscles but no fatty atrophy. Biceps long head:  Intact.  Significant tendinopathy and longitudinal split type tearing.   Acromioclavicular Joint:  Moderate AC joint degenerative changes. The acromion is type 2 in shape.  No significant lateral downsloping or undersurface spurring change. Glenohumeral Joint:  Moderate degenerative changes. Moderate sized joint effusion.   Labrum:  Degenerative changes but no obvious tear. Bones:  No acute fracture. Chief complaint right shoulder pain since Friday, July 26  His clinical exam does not patches MRI findings he has functional range of motion minimal pain in the recommendation is for physical therapy he'll followup when he returns from [place]

## 2012-08-29 NOTE — Patient Instructions (Signed)
OT

## 2012-08-30 ENCOUNTER — Encounter: Payer: Self-pay | Admitting: Orthopedic Surgery

## 2012-09-05 ENCOUNTER — Ambulatory Visit (HOSPITAL_COMMUNITY)
Admission: RE | Admit: 2012-09-05 | Discharge: 2012-09-05 | Disposition: A | Discharge status: Home / Self Care | Payer: Medicare Other | Source: Ambulatory Visit | Attending: Orthopedic Surgery | Admitting: Orthopedic Surgery

## 2012-09-05 ENCOUNTER — Telehealth: Payer: Self-pay | Admitting: Orthopedic Surgery

## 2012-09-05 ENCOUNTER — Other Ambulatory Visit: Payer: Self-pay | Admitting: *Deleted

## 2012-09-05 DIAGNOSIS — IMO0001 Reserved for inherently not codable concepts without codable children: Secondary | ICD-10-CM | POA: Insufficient documentation

## 2012-09-05 DIAGNOSIS — E119 Type 2 diabetes mellitus without complications: Secondary | ICD-10-CM | POA: Insufficient documentation

## 2012-09-05 DIAGNOSIS — M75101 Unspecified rotator cuff tear or rupture of right shoulder, not specified as traumatic: Secondary | ICD-10-CM

## 2012-09-05 DIAGNOSIS — M25619 Stiffness of unspecified shoulder, not elsewhere classified: Secondary | ICD-10-CM | POA: Insufficient documentation

## 2012-09-05 DIAGNOSIS — M25519 Pain in unspecified shoulder: Secondary | ICD-10-CM | POA: Insufficient documentation

## 2012-09-05 DIAGNOSIS — I10 Essential (primary) hypertension: Secondary | ICD-10-CM | POA: Insufficient documentation

## 2012-09-05 NOTE — Evaluation (Signed)
Physical Therapy Evaluation  Patient Details  Name: Steve Ramirez MRN: [id number] Date of Birth: [birth year]  Today's Date: 09/05/2012 Time: 1115-1200 PT Time Calculation (min): 45 min              Visit#: 1 of 8  Re-eval: 10/05/12 Assessment Diagnosis: Rt shoulder rotator cuff tear Surgical Date: 08/15/12 Next MD Visit: Dr. Romeo Apple - 10/06/12  Authorization: [facility] Medicare    Authorization Time Period:    Authorization Visit#: 1 of 8   Past Medical History:  Past Medical History  Diagnosis Date  . Coronary atherosclerosis of native coronary artery   . Type 2 diabetes mellitus   . Essential hypertension, benign   . GIB (gastrointestinal bleeding)     Colonic diverticular bleed  . History of TIAs   . History of melanoma    Past Surgical History:  Past Surgical History  Procedure Laterality Date  . Colonoscopy  08/13/2010    Procedure: COLONOSCOPY;  Surgeon: Malissa Hippo, MD;  Location: [facility];  Service: Endoscopy;  Laterality: N/A;  . Knee surgery      Subjective Symptoms/Limitations Symptoms: Pt is a 77 year old male referred to PT for Rt shoulder RTC tear. after a fall from the ladder.  He was in a sling for one week.  He reports that he is getting better every day and reports that he was able to brush his hair with his RUE this morning.  He states he is a fitness fanatic and goes 3x a week.  he is using the UBE.  He reoprts he is having a difficulty eating for long periods of time and lifting his coffee cup to his mouth, difficulty brushing his teeth at the end and brushing his hair at the end of the day.  Pertinent History: Cervical fusion 18 years ago with tricep dysfunction Pain Assessment Currently in Pain?: No/denies  Precautions/Restrictions  Precautions Precautions: Cervical  Balance Screening Balance Screen Has the patient fallen in the past 6 months: Yes How many times?: 1 Has the patient had a decrease in activity level because of a fear of  falling? : No Is the patient reluctant to leave their home because of a fear of falling? : No  Prior Functioning  Prior Function Vocation: Retired Comments: Enjoys working out at [employer] 3x/week  Cognition/Observation   Observation: atrophy to Rt tricep  Sensation/Coordination/Flexibility/Functional Tests Coordination Gross Motor Movements are Fluid and Coordinated: No Coordination and Movement Description: impaired to RUE scapular movements Functional Tests Functional Tests: Upper Extremity Functional Scale: 62/80   RUE AROM (degrees) Right Shoulder Flexion: 165 Degrees (seated) Right Shoulder ABduction: 145 Degrees (seated) Right Shoulder Internal Rotation: 80 Degrees (supine) Right Shoulder External Rotation: 65 Degrees (supine) RUE Strength Right Shoulder Flexion: 4/5 Right Shoulder Extension: 4/5 Right Shoulder ABduction: 3/5 Right Shoulder Internal Rotation: 3-/5 Right Shoulder External Rotation: 3+/5 Palpation Palpation: mild pain and tenderness over Rt subscapularis   Exercise/Treatments ROM / Strengthening / Isometric Strengthening Rhythmic Stabilization, Supine: 1 minute  Other ROM/Strengthening Exercises: Serratus punches: RUE x10     Physical Therapy Assessment and Plan PT Assessment and Plan Clinical Impression Statement: Pt is a 77 year old male referred to PT secondary to a fall off a ladder leading to a Rt RTC tear to his subscapularis and supraspinatus musculature.  After evaluation it is found that his AROM is good and strength and activity tolerance to Rt shoulder are the most limiting factors.   Pt will benefit from skilled  therapeutic intervention in order to improve on the following deficits: Decreased coordination;Decreased strength;Impaired perceived functional ability;Decreased range of motion Rehab Potential: Good Clinical Impairments Affecting Rehab Potential: vacation from 09/14/12-09/25/12  PT Frequency: Min 2X/week PT Duration: 4 weeks PT  Treatment/Interventions: Therapeutic activities;Therapeutic exercise;Patient/family education;Modalities (modalities: ice and heat as needed) PT Plan: Continue with gentle ROM activities, and isometrics all directions, start with gentle red t-band shoulder extension, rows and lat pull downs if able.  Educate on wall wash to improve activity tolerance to RUE. Pt will be leaving for vacation on 09/14/12 for 2 weeks    Goals Home Exercise Program Pt/caregiver will Perform Home Exercise Program: Independently PT Short Term Goals Time to Complete Short Term Goals: 4 weeks PT Short Term Goal 1: Pt will improve shoulder strength to [facility] in order to report greater ease with eating and grooming activities.  PT Short Term Goal 2: Pt will improve his shoulder activity tolerance to [facility] in order to maintain eating with his RUE for 30 minutes to complete a meal.  PT Short Term Goal 3: Pt will improve his UEFI to greater than 71/80 for improved percieved functional ability.   Problem List Patient Active Problem List   Diagnosis Date Noted  . Shoulder stiffness 09/05/2012  . Right rotator cuff tear 08/16/2012  . Shoulder subluxation, right 08/09/2012  . Elevated transaminase level 11/05/2011  . Coronary atherosclerosis of native coronary artery 11/26/2010  . Essential hypertension, benign 11/23/2010  . Type 2 diabetes mellitus 11/23/2010  . Hyperlipidemia 11/23/2010  . History of TIAs 11/23/2010    PT Plan of Care PT Home Exercise Plan: see scanned report PT Patient Instructions: importance of gentle AROM, HEP, continue with HEP on vacation, answered questions about diagnosis.  Consulted and Agree with Plan of Care: Patient  GP Functional Assessment Tool Used: UEFI: 62/80 (77.75%, 33% impairment) Functional Limitation: Carrying, moving and handling objects Carrying, Moving and Handling Objects Current Status ([id number]): At least 20 percent but less than 40 percent impaired, limited or  restricted Carrying, Moving and Handling Objects Goal Status [phone]): At least 1 percent but less than 20 percent impaired, limited or restricted  Maelyn Berrey, MPT, ATC 09/05/2012, 12:24 PM  Physician Documentation Your signature is required to indicate approval of the treatment plan as stated above.  Please sign and either send electronically or make a copy of this report for your files and return this physician signed original.   Please mark one 1.__approve of plan  2. ___approve of plan with the following conditions.   ______________________________                                                          _____________________ Physician Signature  Date  

## 2012-09-05 NOTE — Telephone Encounter (Signed)
Tammy did this

## 2012-09-05 NOTE — Telephone Encounter (Signed)
Steve Ramirez is at therapy now, [name] asked if you can send an order that states PT.  Said OT is very busy today and since he is there they don't want him to have to wait, as this is a 1 time visit.  Fax to [phone], ATTENTION: [facility]

## 2012-09-05 NOTE — Telephone Encounter (Signed)
Send this type of requets to tammy   She can do it faster thatn i can   Steve Ramirez is at therapy now, Steve Ramirez asked if you can send an order that states PT. Said OT is very busy today and since he is there they don't want him to have to wait, as this is a 1 time visit. Fax to [phone], ATTENTION: [facility]

## 2012-09-07 ENCOUNTER — Ambulatory Visit (HOSPITAL_COMMUNITY)
Admission: RE | Admit: 2012-09-07 | Discharge: 2012-09-07 | Disposition: A | Discharge status: Home / Self Care | Payer: Medicare Other | Source: Ambulatory Visit | Attending: Family Medicine | Admitting: Family Medicine

## 2012-09-07 DIAGNOSIS — M75101 Unspecified rotator cuff tear or rupture of right shoulder, not specified as traumatic: Secondary | ICD-10-CM

## 2012-09-07 NOTE — Progress Notes (Signed)
Physical Therapy Treatment Patient Details  Name: Steve Ramirez MRN: [id number] Date of Birth: Apr 18, 1935  Today's Date: 09/07/2012 Time: 1308-6578 PT Time Calculation (min): 41 min Change: TE [id number]  Visit#: 2 of 8  Re-eval: 10/05/12 Assessment Diagnosis: Rt shoulder rotator cuff tear Surgical Date: 08/15/12 Next MD Visit: Dr. Romeo Apple - 10/06/12  Authorization: [facility] Medicare  Authorization Time Period:    Authorization Visit#: 2 of 8   Subjective: Symptoms/Limitations Symptoms: Pt reports pain free, has already completed exercises at the [facility] today. Pain Assessment Currently in Pain?: No/denies  Precautions/Restrictions  Precautions Precautions: Cervical  Exercise/Treatments Supine External Rotation: PROM;Right;5 reps Internal Rotation: PROM;Right;5 reps Flexion: PROM;Right;5 reps;AROM;10 reps ABduction: PROM;5 reps;10 reps Standing External Rotation: Right;10 reps Internal Rotation: Right;10 reps Extension: Both;10 reps;Theraband Theraband Level (Shoulder Extension): Level 2 (Red) Row: Both;10 reps;Theraband Theraband Level (Shoulder Row): Level 2 (Red) Other Standing Exercises: lats pull down red tband  Other Standing Exercises: triceps 10x     Physical Therapy Assessment and Plan PT Assessment and Plan Clinical Impression Statement: Began POC for Rt shoulder strengthening, activity tolerance and P/AROM.  Pt completed all exercises correctly with no reports of pain.  Noted visible musculature fatigue with activities.   PT Plan: Continue with gentle ROM activities, and isometrics all directions, start with gentle red t-band shoulder extension, rows and lat pull downs if able.  Educate on wall wash to improve activity tolerance to RUE. Pt will be leaving for vacation on 09/14/12 for 2 weeks    Goals    Problem List Patient Active Problem List   Diagnosis Date Noted  . Shoulder stiffness 09/05/2012  . Right rotator cuff tear 08/16/2012  . Shoulder  subluxation, right 08/09/2012  . Elevated transaminase level 11/05/2011  . Coronary atherosclerosis of native coronary artery 11/26/2010  . Essential hypertension, benign 11/23/2010  . Type 2 diabetes mellitus 11/23/2010  . Hyperlipidemia 11/23/2010  . History of TIAs 11/23/2010    PT - End of Session Activity Tolerance: Patient tolerated treatment well General Behavior During Therapy: [facility] for tasks assessed/performed  GP    Juel Burrow 09/07/2012, 11:57 AM

## 2012-09-28 ENCOUNTER — Ambulatory Visit (HOSPITAL_COMMUNITY)
Admission: RE | Admit: 2012-09-28 | Discharge: 2012-09-28 | Disposition: A | Discharge status: Home / Self Care | Payer: Medicare Other | Source: Ambulatory Visit | Attending: Orthopedic Surgery | Admitting: Orthopedic Surgery

## 2012-09-28 DIAGNOSIS — IMO0001 Reserved for inherently not codable concepts without codable children: Secondary | ICD-10-CM | POA: Insufficient documentation

## 2012-09-28 DIAGNOSIS — E119 Type 2 diabetes mellitus without complications: Secondary | ICD-10-CM | POA: Insufficient documentation

## 2012-09-28 DIAGNOSIS — M25619 Stiffness of unspecified shoulder, not elsewhere classified: Secondary | ICD-10-CM | POA: Insufficient documentation

## 2012-09-28 NOTE — Progress Notes (Signed)
Physical Therapy Treatment Patient Details  Name: Steve Ramirez MRN: [id number] Date of Birth: [birth year]  Today's Date: 09/28/2012 Time: 4098-1191 PT Time Calculation (min): 41 min Visit#: 3 of 8  Re-eval: 10/05/12 Authorization: [employer] Medicare  Authorization Visit#: 3 of 8  Charges:  therex 40'  Subjective: Symptoms/Limitations Symptoms: Pt states he did some exercises while in [place].  States [facility].  Pt states it is most difficult to lift objects above waist.  States drinking/eating is near impossible with his Rt UE due to shaking/instability.  Pain Assessment Currently in Pain?: No/denies   Exercise/Treatments Supine External Rotation: PROM;Right;5 reps Internal Rotation: PROM;Right;5 reps Flexion: PROM;Right;5 reps;AROM;10 reps ABduction: PROM;5 reps;10 reps Other Supine Exercises: AAROM flexion, Abd, ER and IR 10 reps each Sidelying External Rotation: 10 reps;AAROM ABduction: 10 reps;AAROM Standing External Rotation: Limitations External Rotation Limitations: unable Internal Rotation: Right;10 reps Extension: Both;10 reps;Theraband Theraband Level (Shoulder Extension): Level 2 (Red) Row: Both;10 reps;Theraband Theraband Level (Shoulder Row): Level 2 (Red) Other Standing Exercises: triceps and lats pull down red tband  Other Standing Exercises: counter to cabinet lifts with [place] bottle 10X ROM / Strengthening / Isometric Strengthening UBE (Upper Arm Bike): 4' backward Wall Wash: 3 X 30" Rhythmic Stabilization, Supine: 1 minute  Other ROM/Strengthening Exercises: Serratus punches: RUE x10      Physical Therapy Assessment and Plan PT Assessment and Plan Clinical Impression Statement: Pt with most difficulty performing exercises above waist level due to extreme weakness and substitution of Rt upper traps.  Added counter to cabinet lifts using less than 1#.  Urged pt to stop assisting Rt UE with Lt UE and attempting to complete all  tasks with Rt UE.  PT Plan: Continue to progress Rt UE stabilzation for return to completion of functional activities.         Problem List Patient Active Problem List   Diagnosis Date Noted  . Shoulder stiffness 09/05/2012  . Right rotator cuff tear 08/16/2012  . Shoulder subluxation, right 08/09/2012  . Elevated transaminase level 11/05/2011  . Coronary atherosclerosis of native coronary artery 11/26/2010  . Essential hypertension, benign 11/23/2010  . Type 2 diabetes mellitus 11/23/2010  . Hyperlipidemia 11/23/2010  . History of TIAs 11/23/2010    PT - End of Session Activity Tolerance: Patient tolerated treatment well General Behavior During Therapy: [facility] for tasks assessed/performed    Lurena Nida, PTA/CLT 09/28/2012, 2:38 PM

## 2012-10-04 ENCOUNTER — Ambulatory Visit (HOSPITAL_COMMUNITY)
Admission: RE | Admit: 2012-10-04 | Discharge: 2012-10-04 | Disposition: A | Discharge status: Home / Self Care | Payer: Medicare Other | Source: Ambulatory Visit | Attending: Orthopedic Surgery | Admitting: Orthopedic Surgery

## 2012-10-04 DIAGNOSIS — M25611 Stiffness of right shoulder, not elsewhere classified: Secondary | ICD-10-CM

## 2012-10-04 DIAGNOSIS — M75101 Unspecified rotator cuff tear or rupture of right shoulder, not specified as traumatic: Secondary | ICD-10-CM

## 2012-10-04 NOTE — Evaluation (Signed)
Physical Therapy Re-Evaluation/Treatment  Patient Details  Name: Steve Ramirez MRN: [id number] Date of Birth: [birth year]  Today's Date: 10/04/2012 Time: 1345-1430 PT Time Calculation (min): 45 min              Visit#: 4 of 8  Re-eval: 10/05/12 Assessment Diagnosis: Rt shoulder rotator cuff tear Surgical Date: 08/15/12 Next MD Visit: Dr. Romeo Apple - 10/06/12  Authorization: [facility] Medicare    Authorization Time Period:    Authorization Visit#: 4 of 8    Subjective Symptoms/Limitations Symptoms: Pt states that he is doing his yard work and is now able to place his coffee in the microwave without it spilling everywhere.  his greatest limitation is placing arms out in front of him with a weight.  Pain Assessment Currently in Pain?: No/denies  Precautions/Restrictions  Precautions Precautions: Cervical  Sensation/Coordination/Flexibility/Functional Tests Functional Tests Functional Tests: Upper Extremity Functional Scale: 63/80 (was 62/80)  Assessment RUE AROM (degrees) Right Shoulder Flexion: 165 Degrees (was  165 seated ) Right Shoulder ABduction: 165 Degrees (was 145 seated) Right Shoulder Internal Rotation: 80 Degrees (was 80 supine) Right Shoulder External Rotation: 65 Degrees (was 65 supine) RUE Strength Right Shoulder Flexion: 4/5 (was 4/5) Right Shoulder Extension: 4/5 (was 4/5) Right Shoulder ABduction: 3/5 (was 3/5) Right Shoulder Internal Rotation: 3-/5 (was 3-/5) Right Shoulder External Rotation: 3+/5 (was 3+/5) Palpation Palpation: without pain and tenderness  Exercise/Treatments Standing Flexion: AAROM;10 reps;Limitations Flexion Limitations: above head to cabniet with PT faciliation and cueing to decrease shoulder elevation ROM / Strengthening / Isometric Strengthening UBE (Upper Arm Bike): 2:30 forward/2;30 backwards 3.0 reistance Thumb Tacks: 15 reps Over Head Lace: 3 minutes Wall Pushups: 15 reps Ball on Wall: 2x30 sec clockwise/ Other  ROM/Strengthening Exercises: Arms on Fire: shoulder flexion, shoulder abduction, shoulder horizontal abduction, forward circles, backward circles x30 sec eac with 2 rest breaks.  Physical Therapy Assessment and Plan PT Assessment and Plan Clinical Impression Statement: Steve Ramirez has attended 4 OP PT visits to address Rt RTC tear with the following findings: Rt shoulder AROM and strength has remained about the same and percieved functional ability.  He has been limited by visits secondary to a 2 week vacation in [place] where he continued to work on his exercises.  He is attending the gym and has improved ease with grooming and eating and is using his RUE for each.  Continues to have dificulty with lifting any weight out in front or above his head. At this time pt will f/u with MD for further recommendation.  PT Plan: F/U with MD.  Await MD orders.     Goals Home Exercise Program Pt/caregiver will Perform Home Exercise Program: Independently (attending gym 3x/week) PT Goal: Perform Home Exercise Program - Progress: Met PT Short Term Goals Time to Complete Short Term Goals: 4 weeks PT Short Term Goal 1: Pt will improve shoulder strength to [facility] in order to report greater ease with eating and grooming activities.  PT Short Term Goal 1 - Progress: Progressing toward goal PT Short Term Goal 2: Pt will improve his shoulder activity tolerance to [facility] in order to maintain eating with his RUE for 30 minutes to complete a meal.  PT Short Term Goal 2 - Progress: Met PT Short Term Goal 3: Pt will improve his UEFI to greater than 71/80 for improved percieved functional ability.  PT Short Term Goal 3 - Progress: Not met  Problem List Patient Active Problem List   Diagnosis Date Noted  . Shoulder stiffness 09/05/2012  .  Right rotator cuff tear 08/16/2012  . Shoulder subluxation, right 08/09/2012  . Elevated transaminase level 11/05/2011  . Coronary atherosclerosis of native coronary artery 11/26/2010   . Essential hypertension, benign 11/23/2010  . Type 2 diabetes mellitus 11/23/2010  . Hyperlipidemia 11/23/2010  . History of TIAs 11/23/2010    PT - End of Session Activity Tolerance: Patient tolerated treatment well General Behavior During Therapy: [facility] for tasks assessed/performed PT Plan of Care PT Patient Instructions: discussed UEFI Consulted and Agree with Plan of Care: Patient  GP    Riot Waterworth, MPT, ATC 10/04/2012, 2:41 PM  Physician Documentation Your signature is required to indicate approval of the treatment plan as stated above.  Please sign and either send electronically or make a copy of this report for your files and return this physician signed original.   Please mark one 1.__approve of plan  2. ___approve of plan with the following conditions.   ______________________________                                                          _____________________ Physician Signature                                                                                                             Date

## 2012-10-06 ENCOUNTER — Ambulatory Visit (INDEPENDENT_AMBULATORY_CARE_PROVIDER_SITE_OTHER): Payer: Medicare Other | Admitting: Orthopedic Surgery

## 2012-10-06 ENCOUNTER — Inpatient Hospital Stay (HOSPITAL_COMMUNITY)
Admission: RE | Admit: 2012-10-06 | Discharge: 2012-10-06 | Disposition: A | Discharge status: Home / Self Care | Payer: Medicare Other | Source: Ambulatory Visit | Attending: Physical Therapy | Admitting: Physical Therapy

## 2012-10-06 ENCOUNTER — Encounter: Payer: Self-pay | Admitting: Orthopedic Surgery

## 2012-10-06 VITALS — BP 141/79 | Ht 68.5 in | Wt 189.0 lb

## 2012-10-06 DIAGNOSIS — M75101 Unspecified rotator cuff tear or rupture of right shoulder, not specified as traumatic: Secondary | ICD-10-CM

## 2012-10-06 DIAGNOSIS — S43429A Sprain of unspecified rotator cuff capsule, initial encounter: Secondary | ICD-10-CM

## 2012-10-06 DIAGNOSIS — M25611 Stiffness of right shoulder, not elsewhere classified: Secondary | ICD-10-CM

## 2012-10-06 NOTE — Progress Notes (Signed)
Physical Therapy Discharge Summary Patient Details  Name: Steve Ramirez MRN: [id number] Date of Birth: 07/03/1935  Today's Date: 10/06/2012    Pt is to be D/C per MD orders.  Pt is independent with his HEP and will continue at his local gym.   Jyoti Harju 10/06/2012, 2:01 PM

## 2012-10-06 NOTE — Patient Instructions (Addendum)
Use bio freeze Stop therapy do exercises at home

## 2012-10-06 NOTE — Progress Notes (Signed)
Patient ID: Steve Ramirez, male   DOB: [birth year], 77 y.o.   MRN: [id number]  Chief Complaint  Patient presents with  . Follow-up    5 week recheck right shoulder   History as noted below along with the x-rays and MRI  The patient has for elevation of 180 with some weakness but overall doing very well has some pain in his deltoid is a this is the primary muscle that is controlling his shoulder at this point  Recommend continued therapy on his own come back in 2 months   This patient is 77 years old he fell off a ladder. 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 He's always had some weakness in the right shoulder. After he fell off the ladder he landed on the right shoulder as a proximally 6 feet. He noticed of the shoulder was out of place again and his wife help back in place when he presented to the ER x-rays show the shoulder reduced.  Rotator cuff:  Significant rotator cuff injury with a complete tear of the subscapularis tendon, near complete tear of the supraspinatus tendon and complete tear of the infraspinatus tendon. Some anterior fibers of the supraspinatus tendon are still intact. Muscles:  Edema extending back into the muscles but no fatty atrophy. Biceps long head:  Intact.  Significant tendinopathy and longitudinal split type tearing.   Acromioclavicular Joint:  Moderate AC joint degenerative changes. The acromion is type 2 in shape.  No significant lateral downsloping or undersurface spurring change. Glenohumeral Joint:  Moderate degenerative changes. Moderate sized joint effusion.   Labrum:  Degenerative changes but no obvious tear. Bones:  No acute fracture.

## 2012-10-11 ENCOUNTER — Ambulatory Visit (HOSPITAL_COMMUNITY): Payer: Medicare Other

## 2012-10-13 ENCOUNTER — Ambulatory Visit (HOSPITAL_COMMUNITY): Payer: Medicare Other | Admitting: Physical Therapy

## 2012-11-17 ENCOUNTER — Other Ambulatory Visit: Payer: Self-pay

## 2012-11-24 ENCOUNTER — Ambulatory Visit (INDEPENDENT_AMBULATORY_CARE_PROVIDER_SITE_OTHER): Payer: Medicare Other | Admitting: Orthopedic Surgery

## 2012-11-24 VITALS — BP 156/83 | Ht 68.5 in | Wt 189.0 lb

## 2012-11-24 DIAGNOSIS — M75101 Unspecified rotator cuff tear or rupture of right shoulder, not specified as traumatic: Secondary | ICD-10-CM

## 2012-11-24 DIAGNOSIS — S43429A Sprain of unspecified rotator cuff capsule, initial encounter: Secondary | ICD-10-CM

## 2012-11-24 NOTE — Progress Notes (Signed)
Patient ID: Steve Ramirez, male   DOB: 1935/04/11, 77 y.o.   MRN: [id number]  Chief Complaint  Patient presents with  . Follow-up    2 month recheck right shoulder   BP 156/83  Ht 5' 8.5" (1.74 m)  Wt 189 lb (85.73 kg)  BMI 28.32 kg/m2  Followup massive tear right rotator cuff MRI showed complete tearing atrophy and fatty infiltration  He is doing well he can now place 2 saw plates into the cabinet above his head   General appearance is normal, the patient is alert and oriented x3 with normal mood and affect.  He is 130 of forward elevation has a negative drop test he does have pain with direct lateral abduction  External rotation 45  System review: Musculoskeletal: He is having some pain in his forearm no tenderness negative test for tennis elbow and medial cultures elbow  Recommended normal activities follow as needed

## 2012-11-24 NOTE — Patient Instructions (Signed)
You are doing well   Continue activities as tolerated

## 2012-12-06 ENCOUNTER — Ambulatory Visit: Payer: Medicare Other | Admitting: Orthopedic Surgery

## 2013-12-21 ENCOUNTER — Encounter (HOSPITAL_COMMUNITY): Payer: Self-pay | Admitting: Cardiovascular Disease

## 2014-11-29 ENCOUNTER — Inpatient Hospital Stay (HOSPITAL_COMMUNITY)
Admission: EM | Admit: 2014-11-29 | Discharge: 2014-11-30 | DRG: 069 | Disposition: A | Discharge status: Home / Self Care | Payer: Medicare Other | Attending: Internal Medicine | Admitting: Internal Medicine

## 2014-11-29 ENCOUNTER — Emergency Department (HOSPITAL_COMMUNITY): Payer: Medicare Other

## 2014-11-29 ENCOUNTER — Encounter (HOSPITAL_COMMUNITY): Payer: Self-pay

## 2014-11-29 DIAGNOSIS — I2510 Atherosclerotic heart disease of native coronary artery without angina pectoris: Secondary | ICD-10-CM | POA: Diagnosis present

## 2014-11-29 DIAGNOSIS — Z87891 Personal history of nicotine dependence: Secondary | ICD-10-CM

## 2014-11-29 DIAGNOSIS — I10 Essential (primary) hypertension: Secondary | ICD-10-CM | POA: Diagnosis present

## 2014-11-29 DIAGNOSIS — Z85820 Personal history of malignant melanoma of skin: Secondary | ICD-10-CM

## 2014-11-29 DIAGNOSIS — G459 Transient cerebral ischemic attack, unspecified: Principal | ICD-10-CM | POA: Diagnosis present

## 2014-11-29 DIAGNOSIS — R4781 Slurred speech: Secondary | ICD-10-CM | POA: Diagnosis present

## 2014-11-29 DIAGNOSIS — E785 Hyperlipidemia, unspecified: Secondary | ICD-10-CM | POA: Diagnosis present

## 2014-11-29 DIAGNOSIS — Z7982 Long term (current) use of aspirin: Secondary | ICD-10-CM

## 2014-11-29 DIAGNOSIS — E118 Type 2 diabetes mellitus with unspecified complications: Secondary | ICD-10-CM | POA: Diagnosis not present

## 2014-11-29 DIAGNOSIS — R29810 Facial weakness: Secondary | ICD-10-CM | POA: Diagnosis present

## 2014-11-29 DIAGNOSIS — Z8673 Personal history of transient ischemic attack (TIA), and cerebral infarction without residual deficits: Secondary | ICD-10-CM | POA: Diagnosis not present

## 2014-11-29 DIAGNOSIS — E119 Type 2 diabetes mellitus without complications: Secondary | ICD-10-CM

## 2014-11-29 DIAGNOSIS — Z7984 Long term (current) use of oral hypoglycemic drugs: Secondary | ICD-10-CM

## 2014-11-29 LAB — URINALYSIS, ROUTINE W REFLEX MICROSCOPIC
BILIRUBIN URINE: NEGATIVE
Glucose, UA: NEGATIVE mg/dL
HGB URINE DIPSTICK: NEGATIVE
KETONES UR: NEGATIVE mg/dL
Leukocytes, UA: NEGATIVE
NITRITE: NEGATIVE
PROTEIN: NEGATIVE mg/dL
Specific Gravity, Urine: 1.01 (ref 1.005–1.030)
pH: 5.5 (ref 5.0–8.0)

## 2014-11-29 LAB — COMPREHENSIVE METABOLIC PANEL
ALK PHOS: 42 U/L (ref 38–126)
ALT: 33 U/L (ref 17–63)
AST: 29 U/L (ref 15–41)
Albumin: 4.6 g/dL (ref 3.5–5.0)
Anion gap: 8 (ref 5–15)
BUN: 18 mg/dL (ref 6–20)
CALCIUM: 9.4 mg/dL (ref 8.9–10.3)
CO2: 25 mmol/L (ref 22–32)
CREATININE: 1.02 mg/dL (ref 0.61–1.24)
Chloride: 103 mmol/L (ref 101–111)
GFR calc non Af Amer: >60 mL/min (ref >60)
Glucose, Bld: 172 mg/dL — ABNORMAL HIGH (ref 65–99)
Potassium: 3.7 mmol/L (ref 3.5–5.1)
SODIUM: 136 mmol/L (ref 135–145)
Total Bilirubin: 0.5 mg/dL (ref 0.3–1.2)
Total Protein: 7.6 g/dL (ref 6.5–8.1)

## 2014-11-29 LAB — GLUCOSE, CAPILLARY: GLUCOSE-CAPILLARY: 127 mg/dL — AB (ref 65–99)

## 2014-11-29 LAB — I-STAT TROPONIN, ED: Troponin i, poc: 0.01 ng/mL (ref 0.00–0.08)

## 2014-11-29 LAB — DIFFERENTIAL
Basophils Absolute: 0 10*3/uL (ref 0.0–0.1)
Basophils Relative: 1 %
Eosinophils Absolute: 0.2 10*3/uL (ref 0.0–0.7)
Eosinophils Relative: 5 %
LYMPHS PCT: 30 %
Lymphs Abs: 1.3 10*3/uL (ref 0.7–4.0)
MONO ABS: 0.3 10*3/uL (ref 0.1–1.0)
MONOS PCT: 8 %
NEUTROS ABS: 2.3 10*3/uL (ref 1.7–7.7)
Neutrophils Relative %: 56 %

## 2014-11-29 LAB — RAPID URINE DRUG SCREEN, HOSP PERFORMED
AMPHETAMINES: NOT DETECTED
BARBITURATES: NOT DETECTED
Benzodiazepines: NOT DETECTED
Cocaine: NOT DETECTED
Opiates: NOT DETECTED
TETRAHYDROCANNABINOL: NOT DETECTED

## 2014-11-29 LAB — ETHANOL: Alcohol, Ethyl (B): <5 mg/dL (ref <5)

## 2014-11-29 LAB — PROTIME-INR
INR: 1.11 (ref 0.00–1.49)
Prothrombin Time: 14.5 seconds (ref 11.6–15.2)

## 2014-11-29 LAB — CBC
HEMATOCRIT: 39.1 % (ref 39.0–52.0)
Hemoglobin: 13.1 g/dL (ref 13.0–17.0)
MCH: 26.4 pg (ref 26.0–34.0)
MCHC: 33.5 g/dL (ref 30.0–36.0)
MCV: 78.8 fL (ref 78.0–100.0)
PLATELETS: 250 10*3/uL (ref 150–400)
RBC: 4.96 MIL/uL (ref 4.22–5.81)
RDW: 14.7 % (ref 11.5–15.5)
WBC: 4.1 10*3/uL (ref 4.0–10.5)

## 2014-11-29 LAB — CBG MONITORING, ED: Glucose-Capillary: 174 mg/dL — ABNORMAL HIGH (ref 65–99)

## 2014-11-29 LAB — APTT: aPTT: 28 seconds (ref 24–37)

## 2014-11-29 MED ORDER — ACETAMINOPHEN 325 MG PO TABS: 650.0000 mg | ORAL_TABLET | ORAL | Status: DC | PRN

## 2014-11-29 MED ORDER — LISINOPRIL 10 MG PO TABS
20.0000 mg | ORAL_TABLET | Freq: Two times a day (BID) | ORAL | Status: DC
  Administered 2014-11-29 – 2014-11-30 (×2): 20 mg via ORAL
  Filled 2014-11-29 (×2): qty 2

## 2014-11-29 MED ORDER — CLOPIDOGREL BISULFATE 75 MG PO TABS
75.0000 mg | ORAL_TABLET | Freq: Every day | ORAL | Status: DC
  Administered 2014-11-30: 75 mg via ORAL
  Filled 2014-11-29: qty 1

## 2014-11-29 MED ORDER — METOPROLOL SUCCINATE ER 50 MG PO TB24
50.0000 mg | ORAL_TABLET | Freq: Every day | ORAL | Status: DC
  Administered 2014-11-30: 50 mg via ORAL
  Filled 2014-11-29: qty 1

## 2014-11-29 MED ORDER — LORATADINE 10 MG PO TABS
10.0000 mg | ORAL_TABLET | Freq: Every day | ORAL | Status: DC
  Administered 2014-11-30: 10 mg via ORAL
  Filled 2014-11-29: qty 1

## 2014-11-29 MED ORDER — STROKE: EARLY STAGES OF RECOVERY BOOK
Freq: Once | Status: DC
  Filled 2014-11-29: qty 1

## 2014-11-29 MED ORDER — LISINOPRIL-HYDROCHLOROTHIAZIDE 20-12.5 MG PO TABS: 1.0000 | ORAL_TABLET | Freq: Two times a day (BID) | ORAL | Status: DC

## 2014-11-29 MED ORDER — ASPIRIN 300 MG RE SUPP
300.0000 mg | Freq: Every day | RECTAL | Status: DC
  Filled 2014-11-29 (×2): qty 1

## 2014-11-29 MED ORDER — SENNOSIDES-DOCUSATE SODIUM 8.6-50 MG PO TABS: 1.0000 | ORAL_TABLET | Freq: Every evening | ORAL | Status: DC | PRN

## 2014-11-29 MED ORDER — ENOXAPARIN SODIUM 40 MG/0.4ML ~~LOC~~ SOLN: 40.0000 mg | SUBCUTANEOUS | Status: DC

## 2014-11-29 MED ORDER — ASPIRIN 81 MG PO CHEW
324.0000 mg | CHEWABLE_TABLET | Freq: Once | ORAL | Status: AC
  Administered 2014-11-29: 324 mg via ORAL
  Filled 2014-11-29: qty 4

## 2014-11-29 MED ORDER — FENOFIBRATE 160 MG PO TABS
160.0000 mg | ORAL_TABLET | Freq: Every day | ORAL | Status: DC
  Administered 2014-11-30: 160 mg via ORAL
  Filled 2014-11-29: qty 1

## 2014-11-29 MED ORDER — INSULIN ASPART 100 UNIT/ML ~~LOC~~ SOLN: 0.0000 [IU] | Freq: Every day | SUBCUTANEOUS | Status: DC

## 2014-11-29 MED ORDER — HYDROCHLOROTHIAZIDE 12.5 MG PO CAPS
12.5000 mg | ORAL_CAPSULE | Freq: Two times a day (BID) | ORAL | Status: DC
  Administered 2014-11-29 – 2014-11-30 (×2): 12.5 mg via ORAL
  Filled 2014-11-29 (×2): qty 1

## 2014-11-29 MED ORDER — INSULIN ASPART 100 UNIT/ML ~~LOC~~ SOLN
0.0000 [IU] | Freq: Three times a day (TID) | SUBCUTANEOUS | Status: DC
  Administered 2014-11-30: 2 [IU] via SUBCUTANEOUS

## 2014-11-29 MED ORDER — ASPIRIN 325 MG PO TABS
325.0000 mg | ORAL_TABLET | Freq: Every day | ORAL | Status: DC
  Administered 2014-11-30: 325 mg via ORAL
  Filled 2014-11-29: qty 1

## 2014-11-29 MED ORDER — ACETAMINOPHEN 650 MG RE SUPP: 650.0000 mg | RECTAL | Status: DC | PRN

## 2014-11-29 MED ORDER — HYDRALAZINE HCL 20 MG/ML IJ SOLN
10.0000 mg | INTRAMUSCULAR | Status: DC | PRN
  Administered 2014-11-30: 10 mg via INTRAVENOUS
  Filled 2014-11-29: qty 1

## 2014-11-29 MED ORDER — SODIUM CHLORIDE 0.9 % IV SOLN
INTRAVENOUS | Status: DC
  Administered 2014-11-29: 20:00:00 via INTRAVENOUS

## 2014-11-29 NOTE — Progress Notes (Signed)
Report finished in EPIC by Dr Eliseo Gum 1242 Report called to Dr Thurnell Garbe [phone]

## 2014-11-29 NOTE — Progress Notes (Signed)
Code Stroke called [id number] Patient in room 1220 Completed in [place]

## 2014-11-29 NOTE — ED Provider Notes (Signed)
CSN: [id number]     Arrival date & time 11/29/14  1208 History   First MD Initiated Contact with Patient 11/29/14 1218     Chief Complaint  Patient presents with  . Code Stroke    An emergency department physician performed an initial assessment on this suspected stroke patient at 1216.  HPI Pt was seen at 1210. Per pt and his wife, c/o sudden onset and persistence of constant "slurred speech" and "difficulty getting words out" that began at 11am PTA. Pt's wife states she was "with him all morning and he was fine." She states the phone rang and it was for the pt. When pt answered the phone, his speech was "slurred." Pt's wife also states pt's lower right face is also "drooping a little." Pt's wife states pt has hx TIA's and she is concerned regarding same today. Denies CP/SOB, no abd pain, no N/V/D, no visual changes, no ataxia, no focal motor weakness, no tingling/numbness in extremities.   Past Medical History  Diagnosis Date  . Coronary atherosclerosis of native coronary artery   . Type 2 diabetes mellitus ([place])   . Essential hypertension, benign   . GIB (gastrointestinal bleeding)     Colonic diverticular bleed  . History of TIAs   . History of melanoma    Past Surgical History  Procedure Laterality Date  . Colonoscopy  08/13/2010    Procedure: COLONOSCOPY;  Surgeon: Rogene Houston, MD;  Location: [facility];  Service: Endoscopy;  Laterality: N/A;  . Knee surgery    . Left heart catheterization with coronary angiogram N/A 11/24/2010    Procedure: LEFT HEART CATHETERIZATION WITH CORONARY ANGIOGRAM;  Surgeon: Josue Hector, MD;  Location: [facility] CATH LAB;  Service: Cardiovascular;  Laterality: N/A;   Family History  Problem Relation Age of Onset  . Coronary artery disease Father   . Coronary artery disease Brother   . Asthma     Social History  Substance Use Topics  . Smoking status: Former Smoker    Types: Cigarettes    Quit date: 01/13/1980  . Smokeless tobacco: Never  Used  . Alcohol Use: No    Review of Systems ROS: Statement: All systems negative except as marked or noted in the HPI; Constitutional: Negative for fever and chills. ; ; Eyes: Negative for eye pain, redness and discharge. ; ; ENMT: Negative for ear pain, hoarseness, nasal congestion, sinus pressure and sore throat. ; ; Cardiovascular: Negative for chest pain, palpitations, diaphoresis, dyspnea and peripheral edema. ; ; Respiratory: Negative for cough, wheezing and stridor. ; ; Gastrointestinal: Negative for nausea, vomiting, diarrhea, abdominal pain, blood in stool, hematemesis, jaundice and rectal bleeding. ; ; Genitourinary: Negative for dysuria, flank pain and hematuria. ; ; Musculoskeletal: Negative for back pain and neck pain. Negative for swelling and trauma.; ; Skin: Negative for pruritus, rash, abrasions, blisters, bruising and skin lesion.; ; Neuro: +slurred speech, word finding, right lower facial droop. Negative for headache, lightheadedness and neck stiffness. Negative for weakness, altered level of consciousness , altered mental status, extremity weakness, paresthesias, involuntary movement, seizure and syncope.      Allergies  Review of patient's allergies indicates no known allergies.  Home Medications   Prior to Admission medications   Medication Sig Start Date End Date Taking? Authorizing Provider  amLODipine (NORVASC) 5 MG tablet Take 5 mg by mouth daily.    Historical Provider, MD  aspirin EC 81 MG tablet Take 81 mg by mouth daily.    Historical Provider,  MD  cetirizine (ZYRTEC) 10 MG tablet Take 10 mg by mouth daily.    Historical Provider, MD  clopidogrel (PLAVIX) 75 MG tablet Take 1 tablet (75 mg total) by mouth daily. 08/08/12   Lendon Colonel, NP  fenofibrate (TRICOR) 145 MG tablet Take 145 mg by mouth daily.    Historical Provider, MD  HYDROcodone-acetaminophen (NORCO/VICODIN) 5-325 MG per tablet Take 1 tablet by mouth every 6 (six) hours as needed for pain. 08/05/12    Milton Ferguson, MD  lisinopril-hydrochlorothiazide (PRINZIDE,ZESTORETIC) 20-12.5 MG per tablet Take 1 tablet by mouth 2 (two) times daily.    Historical Provider, MD  metFORMIN (GLUCOPHAGE) 500 MG tablet Take 500 mg by mouth 2 (two) times daily.    Historical Provider, MD  metoprolol succinate (TOPROL-XL) 50 MG 24 hr tablet Take 50 mg by mouth daily. Take with or immediately following a meal.    Historical Provider, MD  Multiple Vitamin (MULTIVITAMIN) tablet Take 1 tablet by mouth daily.      Historical Provider, MD  niacin (NIASPAN) 500 MG CR tablet Take 500 mg by mouth at bedtime.    Historical Provider, MD  nitroGLYCERIN (NITROSTAT) 0.4 MG SL tablet Place 0.4 mg under the tongue every 5 (five) minutes as needed for chest pain.    Historical Provider, MD  Omega-3 Fatty Acids (OMEGA 3 PO) Take 1,000 mg by mouth 2 (two) times daily.     Historical Provider, MD   BP 164/80 mmHg  Pulse 59  Resp 20  SpO2 96% Physical Exam  1215: Physical examination:  Nursing notes reviewed; Vital signs and O2 SAT reviewed;  Constitutional: Well developed, Well nourished, Well hydrated, In no acute distress; Head:  Normocephalic, atraumatic; Eyes: EOMI, PERRL, No scleral icterus; ENMT: Mouth and pharynx normal, Mucous membranes moist; Neck: Supple, Full range of motion, No lymphadenopathy; Cardiovascular: Regular rate and rhythm, No gallop; Respiratory: Breath sounds clear & equal bilaterally, No wheezes.  Speaking full sentences with ease, Normal respiratory effort/excursion; Chest: Nontender, Movement normal; Abdomen: Soft, Nontender, Nondistended, Normal bowel sounds; Genitourinary: No CVA tenderness; Extremities: Pulses normal, No tenderness, No edema, No calf edema or asymmetry.; Neuro: AA&Ox3, Major CN grossly intact. Speech: +intermittently slurred, +word finding. +mild right lower facial droop.  No nystagmus. Grips equal. Strength 5/5 equal bilat UE's and LE's.  DTR 2/4 equal bilat UE's and LE's.  No gross  sensory deficits.  Pt confused on how to perform cerebellar testing bilat UE's (finger-nose) and LE's (heel-shin)..; Skin: Color normal, Warm, Dry.     ED Course  Procedures (including critical care time) Labs Review   Imaging Review  I have personally reviewed and evaluated these images and lab results as part of my medical decision-making.   EKG Interpretation   Date/Time:  Thursday November 29 2014 12:28:39 EST Ventricular Rate:  61 PR Interval:  210 QRS Duration: 171 QT Interval:  438 QTC Calculation: 441 R Axis:   -58 Text Interpretation:  Sinus rhythm RBBB and LAFB T wave abnormality  Anterolateral leads Baseline wander When compared with ECG of 11/24/2010  Right bundle branch block  and  T wave abnormality Anterolateral leads are  now Present Confirmed by [facility]  MD, Nunzio Cory [phone]) on 11/29/2014  12:46:34 PM      MDM  MDM Reviewed: previous chart, nursing note and vitals Reviewed previous: labs and ECG Interpretation: labs, ECG and CT scan Total time providing critical care: 30-74 minutes. This excludes time spent performing separately reportable procedures and services. Consults: neurology and admitting  MD   CRITICAL CARE Performed by: Alfonzo Feller Total critical care time: 35 minutes Critical care time was exclusive of separately billable procedures and treating other patients. Critical care was necessary to treat or prevent imminent or life-threatening deterioration. Critical care was time spent personally by me on the following activities: development of treatment plan with patient and/or surrogate as well as nursing, discussions with consultants, evaluation of patient's response to treatment, examination of patient, obtaining history from patient or surrogate, ordering and performing treatments and interventions, ordering and review of laboratory studies, ordering and review of radiographic studies, pulse oximetry and re-evaluation of patient's  condition.    Results for orders placed or performed during the hospital encounter of 11/29/14  Ethanol  Result Value Ref Range   Alcohol, Ethyl (B) <5 <5 mg/dL  Protime-INR  Result Value Ref Range   Prothrombin Time 14.5 11.6 - 15.2 seconds   INR 1.11 0.00 - 1.49  APTT  Result Value Ref Range   aPTT 28 24 - 37 seconds  CBC  Result Value Ref Range   WBC 4.1 4.0 - 10.5 K/uL   RBC 4.96 4.22 - 5.81 MIL/uL   Hemoglobin 13.1 13.0 - 17.0 g/dL   HCT 39.1 39.0 - 52.0 %   MCV 78.8 78.0 - 100.0 fL   MCH 26.4 26.0 - 34.0 pg   MCHC 33.5 30.0 - 36.0 g/dL   RDW 14.7 11.5 - 15.5 %   Platelets 250 150 - 400 K/uL  Differential  Result Value Ref Range   Neutrophils Relative % 56 %   Neutro Abs 2.3 1.7 - 7.7 K/uL   Lymphocytes Relative 30 %   Lymphs Abs 1.3 0.7 - 4.0 K/uL   Monocytes Relative 8 %   Monocytes Absolute 0.3 0.1 - 1.0 K/uL   Eosinophils Relative 5 %   Eosinophils Absolute 0.2 0.0 - 0.7 K/uL   Basophils Relative 1 %   Basophils Absolute 0.0 0.0 - 0.1 K/uL  Comprehensive metabolic panel  Result Value Ref Range   Sodium 136 135 - 145 mmol/L   Potassium 3.7 3.5 - 5.1 mmol/L   Chloride 103 101 - 111 mmol/L   CO2 25 22 - 32 mmol/L   Glucose, Bld 172 (H) 65 - 99 mg/dL   BUN 18 6 - 20 mg/dL   Creatinine, Ser 1.02 0.61 - 1.24 mg/dL   Calcium 9.4 8.9 - 10.3 mg/dL   Total Protein 7.6 6.5 - 8.1 g/dL   Albumin 4.6 3.5 - 5.0 g/dL   AST 29 15 - 41 U/L   ALT 33 17 - 63 U/L   Alkaline Phosphatase 42 38 - 126 U/L   Total Bilirubin 0.5 0.3 - 1.2 mg/dL   GFR calc non Af Amer >60 >60 mL/min   GFR calc Af Amer >60 >60 mL/min   Anion gap 8 5 - 15  CBG monitoring, ED  Result Value Ref Range   Glucose-Capillary 174 (H) 65 - 99 mg/dL  I-stat troponin, ED (not at [facility], [facility])  Result Value Ref Range   Troponin i, poc 0.01 0.00 - 0.08 ng/mL   Comment 3           Ct Head Wo Contrast 11/29/2014  ADDENDUM REPORT: 11/29/2014 12:47 ADDENDUM: These results were called by telephone at the time  of interpretation on 11/29/2014 at 12:47 pm to Dr. Francine Graven , who verbally acknowledged these results. Electronically Signed   By: Marijo Conception, M.D.   On: 11/29/2014 12:47  11/29/2014  CLINICAL DATA:  Slurred speech.  Headache. EXAM: CT HEAD WITHOUT CONTRAST TECHNIQUE: Contiguous axial images were obtained from the base of the skull through the vertex without intravenous contrast. COMPARISON:  CT scan of April 15, 2010. FINDINGS: Bony calvarium appears intact. Mild diffuse cortical atrophy is noted. Mild chronic ischemic white matter disease is noted. Old lacunar infarction is noted in left thalamus. No mass effect or midline shift is noted. Ventricular size is within normal limits. There is no evidence of mass lesion, hemorrhage or acute infarction. IMPRESSION: Mild diffuse cortical atrophy. Mild chronic ischemic white matter disease. Old lacunar infarction left thalamus. No acute intracranial abnormality seen. Electronically Signed: By: Marijo Conception, M.D. On: 11/29/2014 12:42    1255:  T/C from [facility] Neuro: Dr. Dwyane Dee has evaluated pt, states pt's symptoms are resolved, pt is not a TIA candidate, give ASA 324mg  PO now, admit for TIA workup, he also recommends EEG to r/o focal seizure as cause for symptoms, as pt's wife told him that "he's had 4 episodes just like this previously." ASA and MRI/MRA brain ordered.   1330:  T/C to [facility] Dr. Roderic Palau, case discussed, including:  HPI, pertinent PM/SHx, VS/PE, dx testing, ED course and treatment:  Agreeable to admit, requests to write temporary orders, obtain observation tele bed to team 1.   Francine Graven, DO 12/02/14 1944

## 2014-11-29 NOTE — H&P (Signed)
[facility] History and Physical  AUNDRAY BATZ [id number] DOB: 03/23/1935 DOA: 11/29/2014  Referring physician: Dr. Thurnell Garbe, ER PCP: Purvis Kilts, MD   Chief Complaint: Difficulty speaking  HPI: DAWYNE DOTEN is a 79 y.o. male history of hypertension and diabetes, presents to the emergency room with transient difficulty speaking. Patient reports that he was in his usual state of health this morning when he was speaking to someone on the phone and difficulty expressing his words. His wife noted that his speech was slurred. He reports that he could understand what everyone was same, but could not form a sentence. He does not have any changes in vision, difficulty with gait, numbness or tingling anywhere. He reports a slight headache in his temples. Symptoms lasted approximately 1-1/2 hours and have since resolved. He was seen in the emergency room were initially code stroke was called, but was discontinued since his symptoms had resolved. He is being admitted for further workup.   Review of Systems:  Pertinent positives as per HPI, otherwise negative  Past Medical History  Diagnosis Date  . Coronary atherosclerosis of native coronary artery   . Type 2 diabetes mellitus ([place])   . Essential hypertension, benign   . GIB (gastrointestinal bleeding)     Colonic diverticular bleed  . History of TIAs   . History of melanoma    Past Surgical History  Procedure Laterality Date  . Colonoscopy  08/13/2010    Procedure: COLONOSCOPY;  Surgeon: Rogene Houston, MD;  Location: [facility];  Service: Endoscopy;  Laterality: N/A;  . Knee surgery    . Left heart catheterization with coronary angiogram N/A 11/24/2010    Procedure: LEFT HEART CATHETERIZATION WITH CORONARY ANGIOGRAM;  Surgeon: Josue Hector, MD;  Location: [facility] CATH LAB;  Service: Cardiovascular;  Laterality: N/A;   Social History:  reports that he quit smoking about 34 years ago. His smoking use included  Cigarettes. He has never used smokeless tobacco. He reports that he does not drink alcohol or use illicit drugs.  No Known Allergies  Family History  Problem Relation Age of Onset  . Coronary artery disease Father   . Coronary artery disease Brother   . Asthma      Prior to Admission medications   Medication Sig Start Date End Date Taking? Authorizing Provider  aspirin EC 81 MG tablet Take 81 mg by mouth daily.   Yes Historical Provider, MD  cetirizine (ZYRTEC) 10 MG tablet Take 10 mg by mouth daily.   Yes Historical Provider, MD  clopidogrel (PLAVIX) 75 MG tablet Take 1 tablet (75 mg total) by mouth daily. 08/08/12  Yes Lendon Colonel, NP  fenofibrate (TRICOR) 145 MG tablet Take 145 mg by mouth daily.   Yes Historical Provider, MD  lisinopril-hydrochlorothiazide (PRINZIDE,ZESTORETIC) 20-12.5 MG per tablet Take 1 tablet by mouth 2 (two) times daily.   Yes Historical Provider, MD  metFORMIN (GLUCOPHAGE) 500 MG tablet Take 500 mg by mouth 2 (two) times daily.   Yes Historical Provider, MD  metoprolol succinate (TOPROL-XL) 50 MG 24 hr tablet Take 50 mg by mouth daily. Take with or immediately following a meal.   Yes Historical Provider, MD  Multiple Vitamin (MULTIVITAMIN) tablet Take 1 tablet by mouth daily.     Yes Historical Provider, MD  nitroGLYCERIN (NITROSTAT) 0.4 MG SL tablet Place 0.4 mg under the tongue every 5 (five) minutes as needed for chest pain.   Yes Historical Provider, MD  Omega-3 Fatty Acids (OMEGA 3  PO) Take 1,000 mg by mouth daily.    Yes Historical Provider, MD   Physical Exam: Filed Vitals:   11/29/14 1530 11/29/14 1600 11/29/14 1616 11/29/14 1800  BP: 184/79 188/72 188/72 224/88  Pulse: 57 57 57 69  Temp:  98 F (36.7 C) 98 F (36.7 C) 98 F (36.7 C)  TempSrc:  Oral Oral Oral  Resp:  16 16 16   Height:  5\' 8"  (1.727 m) 5\' 8"  (1.727 m)   Weight:   86.637 kg (191 lb)   SpO2: 97% 97% 97% 99%    Wt Readings from Last 3 Encounters:  11/29/14 86.637 kg (191  lb)  11/29/14 85.73 kg (189 lb)  11/24/12 85.73 kg (189 lb)    General:  Appears calm and comfortable Eyes: PERRL, normal lids, irises & conjunctiva ENT: grossly normal hearing, lips & tongue Neck: no LAD, masses or thyromegaly Cardiovascular: RRR, no m/r/g. No LE edema. Telemetry: SR, no arrhythmias  Respiratory: CTA bilaterally, no w/r/r. Normal respiratory effort. Abdomen: soft, ntnd Skin: no rash or induration seen on limited exam Musculoskeletal: grossly normal tone BUE/BLE Psychiatric: grossly normal mood and affect, speech fluent and appropriate Neurologic: grossly non-focal.          Labs on Admission:  Basic Metabolic Panel:  Recent Labs Lab 11/29/14 1219  NA 136  K 3.7  CL 103  CO2 25  GLUCOSE 172*  BUN 18  CREATININE 1.02  CALCIUM 9.4   Liver Function Tests:  Recent Labs Lab 11/29/14 1219  AST 29  ALT 33  ALKPHOS 42  BILITOT 0.5  PROT 7.6  ALBUMIN 4.6   No results for input(s): LIPASE, AMYLASE in the last 168 hours. No results for input(s): AMMONIA in the last 168 hours. CBC:  Recent Labs Lab 11/29/14 1219  WBC 4.1  NEUTROABS 2.3  HGB 13.1  HCT 39.1  MCV 78.8  PLT 250   Cardiac Enzymes: No results for input(s): CKTOTAL, CKMB, CKMBINDEX, TROPONINI in the last 168 hours.  BNP (last 3 results) No results for input(s): BNP in the last 8760 hours.  ProBNP (last 3 results) No results for input(s): PROBNP in the last 8760 hours.  CBG:  Recent Labs Lab 11/29/14 1216  GLUCAP 174*    Radiological Exams on Admission: Ct Head Wo Contrast  11/29/2014  ADDENDUM REPORT: 11/29/2014 12:47 ADDENDUM: These results were called by telephone at the time of interpretation on 11/29/2014 at 12:47 pm to Dr. Francine Graven , who verbally acknowledged these results. Electronically Signed   By: Marijo Conception, M.D.   On: 11/29/2014 12:47  11/29/2014  CLINICAL DATA:  Slurred speech.  Headache. EXAM: CT HEAD WITHOUT CONTRAST TECHNIQUE: Contiguous  axial images were obtained from the base of the skull through the vertex without intravenous contrast. COMPARISON:  CT scan of April 15, 2010. FINDINGS: Bony calvarium appears intact. Mild diffuse cortical atrophy is noted. Mild chronic ischemic white matter disease is noted. Old lacunar infarction is noted in left thalamus. No mass effect or midline shift is noted. Ventricular size is within normal limits. There is no evidence of mass lesion, hemorrhage or acute infarction. IMPRESSION: Mild diffuse cortical atrophy. Mild chronic ischemic white matter disease. Old lacunar infarction left thalamus. No acute intracranial abnormality seen. Electronically Signed: By: Marijo Conception, M.D. On: 11/29/2014 12:42   Mr Jodene Nam Head Wo Contrast  11/29/2014  CLINICAL DATA:  79 year old male with slurred speech. Initial encounter. EXAM: MRI HEAD WITHOUT CONTRAST MRA HEAD WITHOUT CONTRAST TECHNIQUE:  Multiplanar, multiecho pulse sequences of the brain and surrounding structures were obtained without intravenous contrast. Angiographic images of the head were obtained using MRA technique without contrast. COMPARISON:  Head CT without contrast 1221 hours today. Brain MRI and intracranial MRA 05/15/2009. FINDINGS: MRI HEAD FINDINGS Major intracranial vascular flow voids are stable. No restricted diffusion or evidence of acute infarction. No midline shift, mass effect, evidence of mass lesion, ventriculomegaly, extra-axial collection or acute intracranial hemorrhage. Cervicomedullary junction and pituitary are within normal limits. Chronic lacunar infarcts in the bilateral deep gray matter. Patchy and confluent bilateral cerebral white matter T2 and FLAIR hyperintensity. Small chronic lacunar infarct in the left cerebellar hemisphere is new since 2011. No other new signal abnormality. Stable visualized cervical spine. Normal bone marrow signal. Visible internal auditory structures appear normal. Mastoids are clear. Chronic paranasal  sinus mucosal thickening has not significantly changed. Orbit and scalp soft tissues are within normal limits. MRA HEAD FINDINGS Stable antegrade flow in the posterior circulation with dominant distal left vertebral artery. Bilateral PICA origins appear within normal limits. Tortuous basilar artery is stable without stenosis. SCA and PCA origins are stable and within normal limits. Tortuous left P1 segment. Posterior communicating arteries are diminutive or absent. Bilateral PCA branches are stable. Stable antegrade flow in both ICA siphons. Siphon irregularity, maximal at the left anterior genu. Mild to moderate stenosis suspected there, but not significantly changed. Bilateral carotid termini remain patent. MCA and ACA origins remain patent. Non dominant and irregular left A1 segment appears stable. Visualized bilateral ACA branches are stable without stenosis. Bilateral MCA M1 segments are stable without stenosis. Visualized bilateral MCA branches are stable without stenosis. IMPRESSION: 1.  No acute intracranial abnormality. 2. Advanced chronic small vessel disease largely stable since 2011, there has been an interval lacunar infarct in the left cerebellum (chronic). 3. Stable intracranial MRA since 2011. Intracranial atherosclerosis with up to moderate chronic stenosis of the left ICA siphon. No acute stenosis or major circle of Willis branch occlusion identified. Electronically Signed   By: Genevie Ann M.D.   On: 11/29/2014 15:10   Mr Brain Wo Contrast (neuro Protocol)  11/29/2014  CLINICAL DATA:  79 year old male with slurred speech. Initial encounter. EXAM: MRI HEAD WITHOUT CONTRAST MRA HEAD WITHOUT CONTRAST TECHNIQUE: Multiplanar, multiecho pulse sequences of the brain and surrounding structures were obtained without intravenous contrast. Angiographic images of the head were obtained using MRA technique without contrast. COMPARISON:  Head CT without contrast 1221 hours today. Brain MRI and intracranial MRA  05/15/2009. FINDINGS: MRI HEAD FINDINGS Major intracranial vascular flow voids are stable. No restricted diffusion or evidence of acute infarction. No midline shift, mass effect, evidence of mass lesion, ventriculomegaly, extra-axial collection or acute intracranial hemorrhage. Cervicomedullary junction and pituitary are within normal limits. Chronic lacunar infarcts in the bilateral deep gray matter. Patchy and confluent bilateral cerebral white matter T2 and FLAIR hyperintensity. Small chronic lacunar infarct in the left cerebellar hemisphere is new since 2011. No other new signal abnormality. Stable visualized cervical spine. Normal bone marrow signal. Visible internal auditory structures appear normal. Mastoids are clear. Chronic paranasal sinus mucosal thickening has not significantly changed. Orbit and scalp soft tissues are within normal limits. MRA HEAD FINDINGS Stable antegrade flow in the posterior circulation with dominant distal left vertebral artery. Bilateral PICA origins appear within normal limits. Tortuous basilar artery is stable without stenosis. SCA and PCA origins are stable and within normal limits. Tortuous left P1 segment. Posterior communicating arteries are diminutive or absent. Bilateral PCA  branches are stable. Stable antegrade flow in both ICA siphons. Siphon irregularity, maximal at the left anterior genu. Mild to moderate stenosis suspected there, but not significantly changed. Bilateral carotid termini remain patent. MCA and ACA origins remain patent. Non dominant and irregular left A1 segment appears stable. Visualized bilateral ACA branches are stable without stenosis. Bilateral MCA M1 segments are stable without stenosis. Visualized bilateral MCA branches are stable without stenosis. IMPRESSION: 1.  No acute intracranial abnormality. 2. Advanced chronic small vessel disease largely stable since 2011, there has been an interval lacunar infarct in the left cerebellum (chronic). 3.  Stable intracranial MRA since 2011. Intracranial atherosclerosis with up to moderate chronic stenosis of the left ICA siphon. No acute stenosis or major circle of Willis branch occlusion identified. Electronically Signed   By: Genevie Ann M.D.   On: 11/29/2014 15:10    EKG: Independently reviewed. Diffuse T wave inv. Unclear if this is new or chronic finding. Last EKG is from 2012  Assessment/Plan Active Problems:   Essential hypertension, benign   Type 2 diabetes mellitus ([facility])   Hyperlipidemia   Coronary atherosclerosis of native coronary artery   TIA (transient ischemic attack)   1. Possible TIA. Patient had difficulty with speech with possible TIA. He will undergo further workup per TIA orders including MRI brain/MRA head, carotid Dopplers, echocardiogram. Check lipid panel and hemoglobin A1c. Per teleneurology patient also consider possible seizure. EEG has been ordered. We'll continue his antiplatelet agents. He is currently on Plavix and low-dose aspirin, will change to full dose aspirin for now. We'll also request neurology consultation. 2. Diabetes. Continue in spite of several insulin. 3. Hypertension. Continue home regimen use hydralazine when necessary. 4. Hyperlipidemia. Continue on fibrates. May need to start a statin based on lipid panel.  Code Status: full code DVT Prophylaxis: lovenox Family Communication: discussed with patient and wife at the bedside Disposition Plan: discharge home, likely in AM  Time spent: 53mins  Keldric Poyer [facility] Pager [phone]

## 2014-11-29 NOTE — ED Notes (Signed)
Code Stroke called.  Paged out. SOC called.

## 2014-11-29 NOTE — ED Notes (Signed)
Pt reports for approx 1 hour has had r sided facial droop, slurred speech, and difficulty "getting my words out."  Reports headache.  Dr. Thurnell Garbe examining pt on hallway stretcher.  CT staff ready for pt.

## 2014-11-30 ENCOUNTER — Ambulatory Visit (HOSPITAL_COMMUNITY): Admission: RE | Admit: 2014-11-30 | Payer: Medicare Other | Source: Ambulatory Visit

## 2014-11-30 ENCOUNTER — Inpatient Hospital Stay (HOSPITAL_COMMUNITY): Payer: Medicare Other

## 2014-11-30 ENCOUNTER — Other Ambulatory Visit (HOSPITAL_COMMUNITY): Payer: Self-pay | Admitting: Internal Medicine

## 2014-11-30 ENCOUNTER — Other Ambulatory Visit (HOSPITAL_COMMUNITY): Payer: Medicare Other

## 2014-11-30 DIAGNOSIS — R4781 Slurred speech: Secondary | ICD-10-CM

## 2014-11-30 DIAGNOSIS — G459 Transient cerebral ischemic attack, unspecified: Secondary | ICD-10-CM

## 2014-11-30 LAB — LIPID PANEL
CHOL/HDL RATIO: 5.7 ratio
CHOLESTEROL: 149 mg/dL (ref 0–200)
HDL: 26 mg/dL — AB (ref >40)
LDL Cholesterol: 97 mg/dL (ref 0–99)
TRIGLYCERIDES: 128 mg/dL (ref <150)
VLDL: 26 mg/dL (ref 0–40)

## 2014-11-30 LAB — GLUCOSE, CAPILLARY: Glucose-Capillary: 150 mg/dL — ABNORMAL HIGH (ref 65–99)

## 2014-11-30 MED ORDER — AMLODIPINE BESYLATE 5 MG PO TABS: 5.0000 mg | ORAL_TABLET | Freq: Every day | ORAL | Status: DC

## 2014-11-30 MED ORDER — ASPIRIN 325 MG PO TABS: 325.0000 mg | ORAL_TABLET | Freq: Every day | ORAL | Status: DC

## 2014-11-30 NOTE — Progress Notes (Signed)
Discharged PT per MD order and protocol. Reviewed discharge teaching and handouts given. Pt verbalized understanding and left with all belongings. VSS. IV catheter D/C.  Follow up appointments made. Patient wheeled down by staff member. Oswald Hillock, RN

## 2014-11-30 NOTE — Discharge Summary (Signed)
Physician Discharge Summary  Steve Ramirez [id number] DOB: Jan 23, 1935 DOA: 11/29/2014  PCP: Purvis Kilts, MD  Admit date: 11/29/2014 Discharge date: 11/30/2014  Time spent: 30 minutes  Recommendations for Outpatient Follow-up:  1. Follow up with your PCP in 1-2 weeks. 2. Follow up with a neurologist within 1 week. The carotid dopplers and EEG will be scheduled as an outpatient.    Discharge Diagnoses:  Active Problems:   Essential hypertension, benign   Type 2 diabetes mellitus (HCC)   Hyperlipidemia   Coronary atherosclerosis of native coronary artery   TIA (transient ischemic attack)   Discharge Condition: Improved  Diet recommendation: Heart Healthy  Filed Weights   11/29/14 1616  Weight: 86.637 kg (191 lb)    History of present illness:  79 y.o. male history of hypertension and diabetes, presented to the emergency room with transient difficulty speaking. Patient reported that he was in his usual state of health this morning when he was speaking to someone on the phone and had difficulty expressing his words. His wife noted that his speech was slurred. He reported that he could understand what everyone was saying, but could not form a sentence. He did not have any changes in vision, difficulty with gait, numbness or tingling anywhere. He reported a slight headache in his temples. Symptoms lasted approximately 1-1/2 hours and have since resolved. He was seen in the emergency room were initially code stroke was called, but was discontinued since his symptoms had resolved. He  was admitted for further workup.   Hospital Course:  Patient presented with difficulty speaking which lasted for approximately 1-1 /12 hours prior to resolving. He was worked up for a possible TIA. Head CT was negative. MRI brain and MRA head were unremarkable for any acute infarct. LDL was 97. ECHO completed however report is pending at the time of discharge. Patient was advised and  offered to complete his workup prior to discharge but refuses and wishes to continue his workup as an outpatient. We will try to schedule close follow up with neurology in the next 1-2 weeks and pursue EEG and carotid dopplers as an outpatient.   He is currently asymptomatic with normal speech and no neurological deficits. He will be discharged on full dose ASA and his home dose of Plavix.   1.  Hypertension, BP elevated. Will add Norvasc to home regimen of Metoprolol, HCTZ, and Lisinopril. Recommend close follow up with PCP. 2.  Hyperlipidemia. LDL 97, above goal however he is intolerant of statins. Will continue on fibrates and life style modifications.   3.  DM type 2, Stable. Continue home regimen.  Consultations:  none  Discharge Exam: Filed Vitals:   11/30/14 0800  BP: 191/73  Pulse: 80  Temp: 97.9 F (36.6 C)  Resp: 18     General: NAD, looks comfortable  Cardiovascular: RRR, S1, S2   Respiratory: clear bilaterally, No wheezing, rales or rhonchi  Abdomen: soft, non tender, no distention , bowel sounds normal  Musculoskeletal: No edema b/l  Discharge Instructions   Discharge Instructions    Diet - low sodium heart healthy    Complete by:  As directed      Increase activity slowly    Complete by:  As directed           Current Discharge Medication List    START taking these medications   Details  amLODipine (NORVASC) 5 MG tablet Take 1 tablet (5 mg total) by mouth daily. Qty: 30 tablet, Refills:  1    aspirin 325 MG tablet Take 1 tablet (325 mg total) by mouth daily.      CONTINUE these medications which have NOT CHANGED   Details  cetirizine (ZYRTEC) 10 MG tablet Take 10 mg by mouth daily.    clopidogrel (PLAVIX) 75 MG tablet Take 1 tablet (75 mg total) by mouth daily. Qty: 30 tablet, Refills: 0    fenofibrate (TRICOR) 145 MG tablet Take 145 mg by mouth daily.    lisinopril-hydrochlorothiazide (PRINZIDE,ZESTORETIC) 20-12.5 MG per tablet Take 1  tablet by mouth 2 (two) times daily.    metFORMIN (GLUCOPHAGE) 500 MG tablet Take 500 mg by mouth 2 (two) times daily.    metoprolol succinate (TOPROL-XL) 50 MG 24 hr tablet Take 50 mg by mouth daily. Take with or immediately following a meal.    Multiple Vitamin (MULTIVITAMIN) tablet Take 1 tablet by mouth daily.      nitroGLYCERIN (NITROSTAT) 0.4 MG SL tablet Place 0.4 mg under the tongue every 5 (five) minutes as needed for chest pain.    Omega-3 Fatty Acids (OMEGA 3 PO) Take 1,000 mg by mouth daily.       STOP taking these medications     aspirin EC 81 MG tablet        No Known Allergies    The results of significant diagnostics from this hospitalization (including imaging, microbiology, ancillary and laboratory) are listed below for reference.    Significant Diagnostic Studies: Ct Head Wo Contrast  11/29/2014  ADDENDUM REPORT: 11/29/2014 12:47 ADDENDUM: These results were called by telephone at the time of interpretation on 11/29/2014 at 12:47 pm to Dr. Francine Graven , who verbally acknowledged these results. Electronically Signed   By: Marijo Conception, M.D.   On: 11/29/2014 12:47  11/29/2014  CLINICAL DATA:  Slurred speech.  Headache. EXAM: CT HEAD WITHOUT CONTRAST TECHNIQUE: Contiguous axial images were obtained from the base of the skull through the vertex without intravenous contrast. COMPARISON:  CT scan of April 15, 2010. FINDINGS: Bony calvarium appears intact. Mild diffuse cortical atrophy is noted. Mild chronic ischemic white matter disease is noted. Old lacunar infarction is noted in left thalamus. No mass effect or midline shift is noted. Ventricular size is within normal limits. There is no evidence of mass lesion, hemorrhage or acute infarction. IMPRESSION: Mild diffuse cortical atrophy. Mild chronic ischemic white matter disease. Old lacunar infarction left thalamus. No acute intracranial abnormality seen. Electronically Signed: By: Marijo Conception, M.D. On:  11/29/2014 12:42   Mr Jodene Nam Head Wo Contrast  11/29/2014  CLINICAL DATA:  79 year old male with slurred speech. Initial encounter. EXAM: MRI HEAD WITHOUT CONTRAST MRA HEAD WITHOUT CONTRAST TECHNIQUE: Multiplanar, multiecho pulse sequences of the brain and surrounding structures were obtained without intravenous contrast. Angiographic images of the head were obtained using MRA technique without contrast. COMPARISON:  Head CT without contrast 1221 hours today. Brain MRI and intracranial MRA 05/15/2009. FINDINGS: MRI HEAD FINDINGS Major intracranial vascular flow voids are stable. No restricted diffusion or evidence of acute infarction. No midline shift, mass effect, evidence of mass lesion, ventriculomegaly, extra-axial collection or acute intracranial hemorrhage. Cervicomedullary junction and pituitary are within normal limits. Chronic lacunar infarcts in the bilateral deep gray matter. Patchy and confluent bilateral cerebral white matter T2 and FLAIR hyperintensity. Small chronic lacunar infarct in the left cerebellar hemisphere is new since 2011. No other new signal abnormality. Stable visualized cervical spine. Normal bone marrow signal. Visible internal auditory structures appear normal. Mastoids are clear.  Chronic paranasal sinus mucosal thickening has not significantly changed. Orbit and scalp soft tissues are within normal limits. MRA HEAD FINDINGS Stable antegrade flow in the posterior circulation with dominant distal left vertebral artery. Bilateral PICA origins appear within normal limits. Tortuous basilar artery is stable without stenosis. SCA and PCA origins are stable and within normal limits. Tortuous left P1 segment. Posterior communicating arteries are diminutive or absent. Bilateral PCA branches are stable. Stable antegrade flow in both ICA siphons. Siphon irregularity, maximal at the left anterior genu. Mild to moderate stenosis suspected there, but not significantly changed. Bilateral carotid  termini remain patent. MCA and ACA origins remain patent. Non dominant and irregular left A1 segment appears stable. Visualized bilateral ACA branches are stable without stenosis. Bilateral MCA M1 segments are stable without stenosis. Visualized bilateral MCA branches are stable without stenosis. IMPRESSION: 1.  No acute intracranial abnormality. 2. Advanced chronic small vessel disease largely stable since 2011, there has been an interval lacunar infarct in the left cerebellum (chronic). 3. Stable intracranial MRA since 2011. Intracranial atherosclerosis with up to moderate chronic stenosis of the left ICA siphon. No acute stenosis or major circle of Willis branch occlusion identified. Electronically Signed   By: Genevie Ann M.D.   On: 11/29/2014 15:10   Mr Brain Wo Contrast (neuro Protocol)  11/29/2014  CLINICAL DATA:  79 year old male with slurred speech. Initial encounter. EXAM: MRI HEAD WITHOUT CONTRAST MRA HEAD WITHOUT CONTRAST TECHNIQUE: Multiplanar, multiecho pulse sequences of the brain and surrounding structures were obtained without intravenous contrast. Angiographic images of the head were obtained using MRA technique without contrast. COMPARISON:  Head CT without contrast 1221 hours today. Brain MRI and intracranial MRA 05/15/2009. FINDINGS: MRI HEAD FINDINGS Major intracranial vascular flow voids are stable. No restricted diffusion or evidence of acute infarction. No midline shift, mass effect, evidence of mass lesion, ventriculomegaly, extra-axial collection or acute intracranial hemorrhage. Cervicomedullary junction and pituitary are within normal limits. Chronic lacunar infarcts in the bilateral deep gray matter. Patchy and confluent bilateral cerebral white matter T2 and FLAIR hyperintensity. Small chronic lacunar infarct in the left cerebellar hemisphere is new since 2011. No other new signal abnormality. Stable visualized cervical spine. Normal bone marrow signal. Visible internal auditory  structures appear normal. Mastoids are clear. Chronic paranasal sinus mucosal thickening has not significantly changed. Orbit and scalp soft tissues are within normal limits. MRA HEAD FINDINGS Stable antegrade flow in the posterior circulation with dominant distal left vertebral artery. Bilateral PICA origins appear within normal limits. Tortuous basilar artery is stable without stenosis. SCA and PCA origins are stable and within normal limits. Tortuous left P1 segment. Posterior communicating arteries are diminutive or absent. Bilateral PCA branches are stable. Stable antegrade flow in both ICA siphons. Siphon irregularity, maximal at the left anterior genu. Mild to moderate stenosis suspected there, but not significantly changed. Bilateral carotid termini remain patent. MCA and ACA origins remain patent. Non dominant and irregular left A1 segment appears stable. Visualized bilateral ACA branches are stable without stenosis. Bilateral MCA M1 segments are stable without stenosis. Visualized bilateral MCA branches are stable without stenosis. IMPRESSION: 1.  No acute intracranial abnormality. 2. Advanced chronic small vessel disease largely stable since 2011, there has been an interval lacunar infarct in the left cerebellum (chronic). 3. Stable intracranial MRA since 2011. Intracranial atherosclerosis with up to moderate chronic stenosis of the left ICA siphon. No acute stenosis or major circle of Willis branch occlusion identified. Electronically Signed   By: Herminio Heads.D.  On: 11/29/2014 15:10     Labs: Basic Metabolic Panel:  Recent Labs Lab 11/29/14 1219  NA 136  K 3.7  CL 103  CO2 25  GLUCOSE 172*  BUN 18  CREATININE 1.02  CALCIUM 9.4   Liver Function Tests:  Recent Labs Lab 11/29/14 1219  AST 29  ALT 33  ALKPHOS 42  BILITOT 0.5  PROT 7.6  ALBUMIN 4.6    CBC:  Recent Labs Lab 11/29/14 1219  WBC 4.1  NEUTROABS 2.3  HGB 13.1  HCT 39.1  MCV 78.8  PLT 250     CBG:  Recent  Labs Lab 11/29/14 1216 11/29/14 2208 11/30/14 0801  GLUCAP 174* 127* 150*     Signed:  Rosalie Doctor  [facility] 11/30/2014, 9:14 AM    By signing my name below, I, Rosalie Doctor, attest that this documentation has been prepared under the direction and in the presence of [facility]. MD Electronically Signed: Rosalie Doctor, Scribe. 11/30/2014 8:53am  I, Dr. Kathie Dike, personally performed the services described in this documentaiton. All medical record entries made by the scribe were at my direction and in my presence. I have reviewed the chart and agree that the record reflects my personal performance and is accurate and complete  Kathie Dike, MD, 11/30/2014 9:17 AM

## 2014-12-01 LAB — HEMOGLOBIN A1C
Hgb A1c MFr Bld: 6.8 % — ABNORMAL HIGH (ref 4.8–5.6)
MEAN PLASMA GLUCOSE: 148 mg/dL

## 2014-12-03 ENCOUNTER — Ambulatory Visit (HOSPITAL_COMMUNITY): Admission: RE | Admit: 2014-12-03 | Payer: Medicare Other | Source: Ambulatory Visit

## 2014-12-03 ENCOUNTER — Ambulatory Visit (HOSPITAL_COMMUNITY): Payer: Medicare Other

## 2014-12-03 ENCOUNTER — Ambulatory Visit (HOSPITAL_COMMUNITY)
Admission: RE | Admit: 2014-12-03 | Discharge: 2014-12-03 | Disposition: A | Discharge status: Home / Self Care | Payer: Medicare Other | Source: Ambulatory Visit | Attending: Internal Medicine | Admitting: Internal Medicine

## 2014-12-03 DIAGNOSIS — I10 Essential (primary) hypertension: Secondary | ICD-10-CM | POA: Insufficient documentation

## 2014-12-03 DIAGNOSIS — I6523 Occlusion and stenosis of bilateral carotid arteries: Secondary | ICD-10-CM | POA: Insufficient documentation

## 2014-12-03 DIAGNOSIS — R4781 Slurred speech: Secondary | ICD-10-CM | POA: Diagnosis not present

## 2014-12-03 DIAGNOSIS — G459 Transient cerebral ischemic attack, unspecified: Secondary | ICD-10-CM | POA: Diagnosis not present

## 2014-12-03 DIAGNOSIS — E785 Hyperlipidemia, unspecified: Secondary | ICD-10-CM | POA: Diagnosis not present

## 2014-12-04 ENCOUNTER — Emergency Department (HOSPITAL_COMMUNITY)
Admission: EM | Admit: 2014-12-04 | Discharge: 2014-12-04 | Discharge status: Against Medical Advice | Payer: Medicare Other | Attending: Emergency Medicine | Admitting: Emergency Medicine

## 2014-12-04 DIAGNOSIS — E119 Type 2 diabetes mellitus without complications: Secondary | ICD-10-CM | POA: Insufficient documentation

## 2014-12-04 DIAGNOSIS — I2510 Atherosclerotic heart disease of native coronary artery without angina pectoris: Secondary | ICD-10-CM | POA: Diagnosis not present

## 2014-12-04 DIAGNOSIS — I10 Essential (primary) hypertension: Secondary | ICD-10-CM | POA: Diagnosis present

## 2014-12-04 NOTE — ED Notes (Signed)
Pt left department prior to being seen.  Informed registration he did not want to wait.

## 2014-12-04 NOTE — ED Notes (Addendum)
Pt reports hypertension since being discharged from hospital on Friday. Pt reports was admitted with a diagnosis of TIA. Pt denies any symptoms or pain at this time.

## 2014-12-13 ENCOUNTER — Ambulatory Visit (HOSPITAL_COMMUNITY)
Admit: 2014-12-13 | Discharge: 2014-12-13 | Disposition: A | Discharge status: Home / Self Care | Payer: Medicare Other | Source: Ambulatory Visit | Attending: Internal Medicine | Admitting: Internal Medicine

## 2014-12-13 DIAGNOSIS — R479 Unspecified speech disturbances: Secondary | ICD-10-CM | POA: Diagnosis not present

## 2014-12-13 DIAGNOSIS — R404 Transient alteration of awareness: Secondary | ICD-10-CM | POA: Diagnosis not present

## 2014-12-13 NOTE — Procedures (Signed)
ELECTROENCEPHALOGRAM REPORT   Patient: Steve Ramirez      Room #: EEG Age: 79 y.o.        Sex: male Referring Physician: Dr Roderic Palau Report Date:  12/13/2014        Interpreting Physician: Hulen Luster  History: TROYE YAMASHIRO is an 79 y.o. male with speech difficulties  Medications:  I have reviewed the patient's current medications.  Conditions of Recording:  This is a 16 channel EEG carried out with the patient in the drowsy state.  Description:  The waking background activity consists of a low voltage, symmetrical, fairly well organized, 8-10 Hz alpha activity, seen from the parieto-occipital and posterior temporal regions. No focal slowing or epileptiform activity is noted. The patient drowses with slowing to irregular, low voltage theta and beta activity.    Hyperventilation was not performed. Intermittent photic stimulation was performed but failed to illicit any change in the tracing.    IMPRESSION: Normal drowsy electroencephalogram. There are no focal lateralizing or epileptiform features.   Jim Like, DO [facility] [phone]  If 7pm- 7am, please page neurology on call as listed in [place]. 12/13/2014, 4:19 PM

## 2014-12-13 NOTE — Progress Notes (Signed)
EEG Completed; Results Pending  

## 2015-02-22 DIAGNOSIS — I10 Essential (primary) hypertension: Secondary | ICD-10-CM | POA: Diagnosis not present

## 2015-02-22 DIAGNOSIS — E7800 Pure hypercholesterolemia, unspecified: Secondary | ICD-10-CM | POA: Diagnosis not present

## 2015-02-22 DIAGNOSIS — E119 Type 2 diabetes mellitus without complications: Secondary | ICD-10-CM | POA: Diagnosis not present

## 2015-03-04 DIAGNOSIS — Z6830 Body mass index (BMI) 30.0-30.9, adult: Secondary | ICD-10-CM | POA: Diagnosis not present

## 2015-03-04 DIAGNOSIS — J069 Acute upper respiratory infection, unspecified: Secondary | ICD-10-CM | POA: Diagnosis not present

## 2015-03-04 DIAGNOSIS — Z1389 Encounter for screening for other disorder: Secondary | ICD-10-CM | POA: Diagnosis not present

## 2015-03-04 DIAGNOSIS — I10 Essential (primary) hypertension: Secondary | ICD-10-CM | POA: Diagnosis not present

## 2015-04-10 ENCOUNTER — Ambulatory Visit: Payer: Medicare HMO | Admitting: Orthopedic Surgery

## 2015-04-12 ENCOUNTER — Ambulatory Visit: Payer: Medicare HMO | Admitting: Orthopedic Surgery

## 2015-04-16 ENCOUNTER — Ambulatory Visit (INDEPENDENT_AMBULATORY_CARE_PROVIDER_SITE_OTHER): Payer: Medicare HMO

## 2015-04-16 ENCOUNTER — Ambulatory Visit (INDEPENDENT_AMBULATORY_CARE_PROVIDER_SITE_OTHER): Payer: Medicare HMO | Admitting: Orthopedic Surgery

## 2015-04-16 ENCOUNTER — Encounter: Payer: Self-pay | Admitting: Orthopedic Surgery

## 2015-04-16 VITALS — BP 151/80 | Ht 68.5 in | Wt 190.0 lb

## 2015-04-16 DIAGNOSIS — M25551 Pain in right hip: Secondary | ICD-10-CM

## 2015-04-16 DIAGNOSIS — M4806 Spinal stenosis, lumbar region: Secondary | ICD-10-CM

## 2015-04-16 DIAGNOSIS — M545 Low back pain, unspecified: Secondary | ICD-10-CM

## 2015-04-16 DIAGNOSIS — M48061 Spinal stenosis, lumbar region without neurogenic claudication: Secondary | ICD-10-CM

## 2015-04-16 NOTE — Progress Notes (Signed)
Chief Complaint  Patient presents with  . Back Pain    RIGHT SIDED BACK PAIN   Last office visit November 2014  HPI-[name] gives the following history. The patient presents for evaluation of his right hip. He's had pain for several months. He reports pain associated with locking and giving way in the right hip pain appears to be just behind the right trochanter and is dull and aching and it's constant although its 3 out of 10 and unrelieved by Tylenol 650 on a as-needed basis.  However he also reports that he gets significant pain relief if he bends forward from the waist. His symptoms seem to be intermittently related to walking.  Review of Systems  Constitutional: Negative for fever and chills.  HENT: Positive for hearing loss.   Gastrointestinal: Positive for constipation. Negative for diarrhea.  Genitourinary: Negative for urgency and frequency.  Neurological: Positive for focal weakness. Negative for tingling.  Endo/Heme/Allergies: Positive for environmental allergies.    Past Medical History  Diagnosis Date  . Coronary atherosclerosis of native coronary artery   . Type 2 diabetes mellitus ([place])   . Essential hypertension, benign   . GIB (gastrointestinal bleeding)     Colonic diverticular bleed  . History of TIAs   . History of melanoma     Past Surgical History  Procedure Laterality Date  . Colonoscopy  08/13/2010    Procedure: COLONOSCOPY;  Surgeon: Rogene Houston, MD;  Location: [facility];  Service: Endoscopy;  Laterality: N/A;  . Knee surgery    . Left heart catheterization with coronary angiogram N/A 11/24/2010    Procedure: LEFT HEART CATHETERIZATION WITH CORONARY ANGIOGRAM;  Surgeon: Josue Hector, MD;  Location: [facility] CATH LAB;  Service: Cardiovascular;  Laterality: N/A;   Family History  Problem Relation Age of Onset  . Coronary artery disease Father   . Coronary artery disease Brother   . Asthma     Social History  Substance Use Topics  .  Smoking status: Former Smoker    Types: Cigarettes    Quit date: 01/13/1980  . Smokeless tobacco: Never Used  . Alcohol Use: No    Current outpatient prescriptions:  .  amLODipine (NORVASC) 5 MG tablet, Take 1 tablet (5 mg total) by mouth daily., Disp: 30 tablet, Rfl: 1 .  aspirin 325 MG tablet, Take 1 tablet (325 mg total) by mouth daily., Disp: , Rfl:  .  cetirizine (ZYRTEC) 10 MG tablet, Take 10 mg by mouth daily., Disp: , Rfl:  .  clopidogrel (PLAVIX) 75 MG tablet, Take 1 tablet (75 mg total) by mouth daily., Disp: 30 tablet, Rfl: 0 .  fenofibrate (TRICOR) 145 MG tablet, Take 145 mg by mouth daily., Disp: , Rfl:  .  lisinopril-hydrochlorothiazide (PRINZIDE,ZESTORETIC) 20-12.5 MG per tablet, Take 1 tablet by mouth 2 (two) times daily., Disp: , Rfl:  .  metFORMIN (GLUCOPHAGE) 500 MG tablet, Take 500 mg by mouth 2 (two) times daily., Disp: , Rfl:  .  metoprolol succinate (TOPROL-XL) 50 MG 24 hr tablet, Take 50 mg by mouth daily. Take with or immediately following a meal., Disp: , Rfl:  .  Multiple Vitamin (MULTIVITAMIN) tablet, Take 1 tablet by mouth daily.  , Disp: , Rfl:  .  nitroGLYCERIN (NITROSTAT) 0.4 MG SL tablet, Place 0.4 mg under the tongue every 5 (five) minutes as needed for chest pain., Disp: , Rfl:  .  Omega-3 Fatty Acids (OMEGA 3 PO), Take 1,000 mg by mouth daily. , Disp: ,  Rfl:  .  [DISCONTINUED] rosuvastatin (CRESTOR) 40 MG tablet, Take 1 tablet (40 mg total) by mouth daily., Disp: 30 tablet, Rfl: 6  BP 151/80 mmHg  Ht 5' 8.5" (1.74 m)  Wt 190 lb (86.183 kg)  BMI 28.47 kg/m2  Physical Exam  Constitutional: He is oriented to person, place, and time. He appears well-developed and well-nourished. No distress.  Cardiovascular: Normal rate and intact distal pulses.   Musculoskeletal:  Gait is normal  Neurological: He is alert and oriented to person, place, and time.  Skin: Skin is warm and dry. No rash noted. He is not diaphoretic. No erythema. No pallor.  Psychiatric: He  has a normal mood and affect. His behavior is normal. Judgment and thought content normal.    Right Knee Exam   Tests  Drawer:       Anterior - negative      Other  Erythema: absent Scars: absent   Left Knee Exam   Tests  Drawer:       Anterior - negative       Other  Erythema: absent Scars: absent   Back Exam   Tenderness  The patient is experiencing no tenderness.   Range of Motion  Extension: 30  Flexion: 60   Muscle Strength  Right Quadriceps:  5/5  Left Quadriceps:  5/5  Right Hamstrings:  5/5  Left Hamstrings:  5/5   Reflexes  Patellar: 0/4 Achilles: 0/4  Other  Toe Walk: normal Heel Walk: normal Sensation: normal Gait: normal  Erythema: no back redness        ASSESSMENT: My personal interpretation of the images:  The pelvic x-ray shows bilateral normal hips  The lumbar spine x-ray shows degenerative disc disease at L4 and 5 and also at L5-S1    PLAN I think the patient has early-onset spinal stenosis as evidenced by the pain which is relieved by flexing the spine. I think he is managed well at this point with Tylenol and is getting good pain relief. We will certainly send him to therapy for home exercise program and continue Tylenol. If this stops began to become more symptomatic then we will do formal therapy and then follow that with an MRI if no improvement

## 2015-04-16 NOTE — Patient Instructions (Signed)
Call hospital to arrange PT   Spinal Stenosis Spinal stenosis is an abnormal narrowing of the canals of your spine (vertebrae). CAUSES  Spinal stenosis is caused by areas of bone pushing into the central canals of your vertebrae. This condition can be present at birth (congenital). It also may be caused by arthritic deterioration of your vertebrae (spinal degeneration).  SYMPTOMS   Pain that is generally worse with activities, particularly standing and walking.  Numbness, tingling, hot or cold sensations, weakness, or weariness in your legs.  Frequent episodes of falling.  A foot-slapping gait that leads to muscle weakness. DIAGNOSIS  Spinal stenosis is diagnosed with the use of magnetic resonance imaging (MRI) or computed tomography (CT). TREATMENT  Initial therapy for spinal stenosis focuses on the management of the pain and other symptoms associated with the condition. These therapies include:  Practicing postural changes to lessen pressure on your nerves.  Exercises to strengthen the core of your body.  Loss of excess body weight.  The use of nonsteroidal anti-inflammatory medicines to reduce swelling and inflammation in your nerves. When therapies to manage pain are not successful, surgery to treat spinal stenosis may be recommended. This surgery involves removing excess bone, which puts pressure on your nerve roots. During this surgery (laminectomy), the posterior boney arch (lamina) and excess bone around the facet joints are removed.   This information is not intended to replace advice given to you by your health care provider. Make sure you discuss any questions you have with your health care provider.   Document Released: 03/21/2003 Document Revised: 01/19/2014 Document Reviewed: 04/08/2012 Elsevier Interactive Patient Education [employer].

## 2015-04-30 ENCOUNTER — Ambulatory Visit (HOSPITAL_COMMUNITY): Payer: Medicare HMO | Attending: Orthopedic Surgery | Admitting: Physical Therapy

## 2015-04-30 ENCOUNTER — Encounter (HOSPITAL_COMMUNITY): Payer: Self-pay | Admitting: Physical Therapy

## 2015-04-30 DIAGNOSIS — R29898 Other symptoms and signs involving the musculoskeletal system: Secondary | ICD-10-CM

## 2015-04-30 DIAGNOSIS — M545 Low back pain: Secondary | ICD-10-CM | POA: Insufficient documentation

## 2015-04-30 NOTE — Patient Instructions (Signed)
Provided handout of HEP with pt return demonstration:  Stretches: hip flexor stretch, standing quad stretch, supine piriformis stretch, standing ITB stretch; hold 30 sec, repeat 3x  Ab set: hold 5 sec, repeat 20x Ab set progression: alternate bent knee raise, repeat 20x SLR with ab set: 2x10 each Bridge hold with alt knee ext x5 reps, repeat 3-4 times. Quad: UE and LE raise x10 reps each; progression to alt UE/LE reach x10 reps

## 2015-05-01 NOTE — Therapy (Signed)
[facility] [address] [place], [place], [address] Phone: [phone]   Fax:  [phone]  Physical Therapy Evaluation  Patient Details  Name: GEORGY STAPLE MRN: [id number] Date of Birth: Jun 03, 1935 Referring Provider: Arther Abbott, MD  Encounter Date: 04/30/2015      PT End of Session - 04/30/15 1819    Visit Number 1   Number of Visits 1   Authorization Type [employer] medicare   PT Start Time [id number]   PT Stop Time 1432   PT Time Calculation (min) 47 min   Activity Tolerance Patient tolerated treatment well   Behavior During Therapy [facility] for tasks assessed/performed      Past Medical History  Diagnosis Date  . Coronary atherosclerosis of native coronary artery   . Type 2 diabetes mellitus ([place])   . Essential hypertension, benign   . GIB (gastrointestinal bleeding)     Colonic diverticular bleed  . History of TIAs   . History of melanoma     Past Surgical History  Procedure Laterality Date  . Colonoscopy  08/13/2010    Procedure: COLONOSCOPY;  Surgeon: Rogene Houston, MD;  [facility];  Service: Endoscopy;  Laterality: N/A;  . Knee surgery    . Left heart catheterization with coronary angiogram N/A 11/24/2010    Procedure: LEFT HEART CATHETERIZATION WITH CORONARY ANGIOGRAM;  Surgeon: Josue Hector, MD;  Location: [facility] CATH LAB;  Service: Cardiovascular;  Laterality: N/A;    There were no vitals filed for this visit.       Subjective Assessment - 04/30/15 1351    Subjective Pt notes that his pain started almost 2 years ago. He noted shooting pain into B thighs (R>L) with walking and other activity. He noticed his pain was improved with prolonged flexion. He sleeps on his R side and notices increased pain. He went to his MD, got xrays and was diagnosed with spinal stenosis who referred him here for 1 visit to get an exercise program.   Pertinent History DMII, Hx of TIA   Limitations --  no limitations  from hip/back   How long can you sit comfortably? unlimited   How long can you stand comfortably? 10 minutes   How long can you walk comfortably? unlimited   Diagnostic tests Xray or MRI   Patient Stated Goals Decrease pain    Currently in Pain? No/denies  max pain 3/10 during the night            [facility] PT Assessment - 05/01/15 0001    Assessment   Medical Diagnosis spinal stenosis of lumbar region   Referring Provider Arther Abbott, MD   Onset Date/Surgical Date --  2 years ago   Next MD Visit None   Prior Therapy None    Precautions   Precautions None   Restrictions   Weight Bearing Restrictions No   Balance Screen   Has the patient fallen in the past 6 months No   Has the patient had a decrease in activity level because of a fear of falling?  No   Is the patient reluctant to leave their home because of a fear of falling?  No   Home Environment   Living Environment Private residence   Living Arrangements Spouse/significant other   Type of [place] to enter  2 STE back door, 5 STE front door   Prior Function   Level of [place] Retired  Sensation   Light Touch Appears Intact   Functional Tests   Functional tests Sit to Stand   Sit to Stand   Comments 5x sit/stand: 10 sec   Posture/Postural Control   Posture/Postural Control Postural limitations   Postural Limitations Rounded Shoulders;Forward head;Posterior pelvic tilt   AROM   Lumbar Flexion WNL   Lumbar Extension WNL   Lumbar - Right Side Bend WNL   Lumbar - Left Side Bend WNL   Lumbar - Right Rotation WNL   Lumbar - Left Rotation WNL   Strength   Right Hip Flexion 4+/5   Right Hip Extension 5/5   Right Hip ABduction 4/5  compensation   Left Hip Flexion 5/5   Left Hip Extension 5/5   Left Hip ABduction 5/5   Right Knee Flexion 5/5   Right Knee Extension 5/5   Left Knee Flexion 5/5   Left Knee Extension 5/5   Right Ankle Dorsiflexion 5/5   Left  Ankle Dorsiflexion 5/5   Flexibility   Soft Tissue Assessment /Muscle Length yes   Hamstrings R/L;  35/25   Quadriceps (+)   ITB (+)    Piriformis (+)    Palpation   Palpation comment No tenderness noted along R glute region/greater trochanter/hamstring/lumbar paraspinals; Mildly hypomobile lumbar spine   Ambulation/Gait   Gait Comments gait was symmetrical, good arm swing, step length, upright posture noted                   [facility] Adult PT Treatment/Exercise - 05/01/15 0001    Exercises   Exercises Lumbar;Knee/Hip   Lumbar Exercises: Stretches   Active Hamstring Stretch 1 rep;20 seconds   Hip Flexor Stretch 1 rep;20 seconds   Quad Stretch 10 seconds;5 reps   Quad Stretch Limitations standing   Piriformis Stretch 1 rep;20 seconds   Piriformis Stretch Limitations supine   Lumbar Exercises: Supine   Ab Set 5 reps   Bent Knee Raise 5 reps   Bridge 5 reps   Bridge Limitations bridge hold with alt knee ext.    Straight Leg Raise 5 reps   Lumbar Exercises: Quadruped   Single Arm Raise 5 reps;Right;Left   Straight Leg Raise 5 reps   Opposite Arm/Leg Raise 5 reps;Left arm/Right leg;Right arm/Left leg                PT Education - 04/30/15 1817    Education provided Yes   Education Details discussed eval findings and POC; reviewed and provided handout of HEP; discussed avoiding weighted ab machines at the gym and continuing with provided HEP instead   Person(s) Educated Patient   Methods Explanation;Demonstration;Handout   Comprehension Verbalized understanding;Returned demonstration          PT Short Term Goals - 04/30/15 1822    PT SHORT TERM GOAL #1   Title Pt will demonstrate understanding and independence with detailed HEP.    Time 1   Period Days   Status Achieved           PT Long Term Goals - 04/30/15 1823    PT LONG TERM GOAL #1   Title No established long term goals due to one time visit               Plan - 04/30/15 1819     Clinical Impression Statement Mr Waterford is a 80yo M admitted to OPP[facility] for evaluation of low back and hip pain of insidious onset. Upon examination he demonstrates overall good LE strength,  LE/lumbar AROM and pain free mobility. Therapist also noting no tenderness with palpation along ITB, gluteal/piriformis region, and lumbar paraspinals. Only impairments noted were his limited flexibility throughout the hamstrings, quads, hip flexors, and some ITB tightness in BLE as well as some segmental hypomobility noted throughout his lumbar spine with no reproduction of pain throughout the entire session. At this time, he is very functional and going to the gym several times a day. He is not limited by his pain which he rates a 3/10 on VAS and is able to relieve any pain he may experience with flexion techniques and medication. Therapist discussed findings with pt and he is requesting a one-time visit as well as a program to improve his mobility and pain that he can perform at the gym on his own. Therapist provided a detailed home management program focusing on core stability and LE flexibility as well as education on several machines to avoid at the gym to prevent increased loads on the spine with pt verbalizing understanding and returning demonstration of proper technique with exercises. At this time, the pt would not benefit from further skilled PT services and should be able to manage his current status at home with the provided HEP.    Rehab Potential Good   PT Frequency One time visit   PT Treatment/Interventions Moist Heat;Therapeutic activities;Therapeutic exercise;Neuromuscular re-education;Patient/family education;Passive range of motion;Manual techniques   PT Next Visit Plan None, due to one time visit   PT Home Exercise Plan provided detailed HEP   Consulted and Agree with Plan of Care Patient      Patient will benefit from skilled therapeutic intervention in order to improve the following deficits  and impairments:  Other (comment) (Single treatment due to MD request and pt presenting with minimal limitations at this time)  Visit Diagnosis: Midline low back pain, with sciatica presence unspecified  Other symptoms and signs involving the musculoskeletal system      G-Codes - 2015-05-04 1510    Functional Assessment Tool Used clinical judgement based on assessment of mobility, strength, ROM.   Functional Limitation Mobility: Walking and moving around   Mobility: Walking and Moving Around Current Status [phone]) At least 1 percent but less than 20 percent impaired, limited or restricted   Mobility: Walking and Moving Around Goal Status [phone]) At least 1 percent but less than 20 percent impaired, limited or restricted   Mobility: Walking and Moving Around Discharge Status [phone]) At least 1 percent but less than 20 percent impaired, limited or restricted       Problem List Patient Active Problem List   Diagnosis Date Noted  . TIA (transient ischemic attack) 11/29/2014  . Shoulder stiffness 09/05/2012  . Right rotator cuff tear 08/16/2012  . Shoulder subluxation, right 08/09/2012  . Elevated transaminase level 11/05/2011  . Coronary atherosclerosis of native coronary artery 11/26/2010  . Essential hypertension, benign 11/23/2010  . Type 2 diabetes mellitus ([place]) 11/23/2010  . Hyperlipidemia 11/23/2010  . History of TIAs 11/23/2010   3:30 PM,2015/05/04 Elly Modena PT, DPT [facility] [place] [address] [place], [place], [address] Phone: [phone]   Fax:  [phone]  Name: ZAINE ZUHLKE MRN: [id number] Date of Birth: May 02, 1935

## 2015-05-20 DIAGNOSIS — M9905 Segmental and somatic dysfunction of pelvic region: Secondary | ICD-10-CM | POA: Diagnosis not present

## 2015-05-20 DIAGNOSIS — M9902 Segmental and somatic dysfunction of thoracic region: Secondary | ICD-10-CM | POA: Diagnosis not present

## 2015-05-20 DIAGNOSIS — M9903 Segmental and somatic dysfunction of lumbar region: Secondary | ICD-10-CM | POA: Diagnosis not present

## 2015-05-20 DIAGNOSIS — M5441 Lumbago with sciatica, right side: Secondary | ICD-10-CM | POA: Diagnosis not present

## 2015-05-22 DIAGNOSIS — M9903 Segmental and somatic dysfunction of lumbar region: Secondary | ICD-10-CM | POA: Diagnosis not present

## 2015-05-22 DIAGNOSIS — M5441 Lumbago with sciatica, right side: Secondary | ICD-10-CM | POA: Diagnosis not present

## 2015-05-22 DIAGNOSIS — M9902 Segmental and somatic dysfunction of thoracic region: Secondary | ICD-10-CM | POA: Diagnosis not present

## 2015-05-22 DIAGNOSIS — M9905 Segmental and somatic dysfunction of pelvic region: Secondary | ICD-10-CM | POA: Diagnosis not present

## 2015-05-27 DIAGNOSIS — M9905 Segmental and somatic dysfunction of pelvic region: Secondary | ICD-10-CM | POA: Diagnosis not present

## 2015-05-27 DIAGNOSIS — M9903 Segmental and somatic dysfunction of lumbar region: Secondary | ICD-10-CM | POA: Diagnosis not present

## 2015-05-27 DIAGNOSIS — M9902 Segmental and somatic dysfunction of thoracic region: Secondary | ICD-10-CM | POA: Diagnosis not present

## 2015-05-27 DIAGNOSIS — M5441 Lumbago with sciatica, right side: Secondary | ICD-10-CM | POA: Diagnosis not present

## 2015-05-29 DIAGNOSIS — M9903 Segmental and somatic dysfunction of lumbar region: Secondary | ICD-10-CM | POA: Diagnosis not present

## 2015-05-29 DIAGNOSIS — M9905 Segmental and somatic dysfunction of pelvic region: Secondary | ICD-10-CM | POA: Diagnosis not present

## 2015-05-29 DIAGNOSIS — M5441 Lumbago with sciatica, right side: Secondary | ICD-10-CM | POA: Diagnosis not present

## 2015-05-29 DIAGNOSIS — M9902 Segmental and somatic dysfunction of thoracic region: Secondary | ICD-10-CM | POA: Diagnosis not present

## 2015-06-04 DIAGNOSIS — E119 Type 2 diabetes mellitus without complications: Secondary | ICD-10-CM | POA: Diagnosis not present

## 2015-09-04 ENCOUNTER — Ambulatory Visit (INDEPENDENT_AMBULATORY_CARE_PROVIDER_SITE_OTHER): Payer: Medicare HMO | Admitting: Orthopedic Surgery

## 2015-09-04 ENCOUNTER — Encounter: Payer: Self-pay | Admitting: Orthopedic Surgery

## 2015-09-04 VITALS — BP 151/84 | HR 64 | Ht 67.0 in | Wt 195.0 lb

## 2015-09-04 DIAGNOSIS — M4806 Spinal stenosis, lumbar region: Secondary | ICD-10-CM

## 2015-09-04 DIAGNOSIS — M48061 Spinal stenosis, lumbar region without neurogenic claudication: Secondary | ICD-10-CM

## 2015-09-04 NOTE — Patient Instructions (Signed)
Please use a cane !

## 2015-09-04 NOTE — Progress Notes (Signed)
Chief Complaint  Patient presents with  . Leg Problem    right leg numbness   HPI  Review of Systems  Constitutional: Negative for chills and fever.  Gastrointestinal: Negative.   Genitourinary: Negative.   Neurological: Positive for tingling and sensory change.    Past Medical History:  Diagnosis Date  . Coronary atherosclerosis of native coronary artery   . Essential hypertension, benign   . GIB (gastrointestinal bleeding)    Colonic diverticular bleed  . History of melanoma   . History of TIAs   . Type 2 diabetes mellitus ([place])     Past Surgical History:  Procedure Laterality Date  . COLONOSCOPY  08/13/2010   Procedure: COLONOSCOPY;  Surgeon: Rogene Houston, MD;  [facility];  Service: Endoscopy;  Laterality: N/A;  . KNEE SURGERY    . LEFT HEART CATHETERIZATION WITH CORONARY ANGIOGRAM N/A 11/24/2010   Procedure: LEFT HEART CATHETERIZATION WITH CORONARY ANGIOGRAM;  Surgeon: Josue Hector, MD;  Location[facility] CATH LAB;  Service: Cardiovascular;  Laterality: N/A;   Family History  Problem Relation Age of Onset  . Coronary artery disease Father   . Coronary artery disease Brother   . Asthma     Social History  Substance Use Topics  . Smoking status: Former Smoker    Types: Cigarettes    Quit date: 01/13/1980  . Smokeless tobacco: Never Used  . Alcohol use No    Current Outpatient Prescriptions:  .  amLODipine (NORVASC) 5 MG tablet, Take 1 tablet (5 mg total) by mouth daily., Disp: 30 tablet, Rfl: 1 .  aspirin 325 MG tablet, Take 1 tablet (325 mg total) by mouth daily., Disp: , Rfl:  .  cetirizine (ZYRTEC) 10 MG tablet, Take 10 mg by mouth daily., Disp: , Rfl:  .  clopidogrel (PLAVIX) 75 MG tablet, Take 1 tablet (75 mg total) by mouth daily., Disp: 30 tablet, Rfl: 0 .  fenofibrate (TRICOR) 145 MG tablet, Take 145 mg by mouth daily., Disp: , Rfl:  .  lisinopril-hydrochlorothiazide (PRINZIDE,ZESTORETIC) 20-12.5 MG per tablet, Take 1 tablet by mouth 2 (two)  times daily., Disp: , Rfl:  .  metFORMIN (GLUCOPHAGE) 500 MG tablet, Take 500 mg by mouth 2 (two) times daily., Disp: , Rfl:  .  metoprolol succinate (TOPROL-XL) 50 MG 24 hr tablet, Take 50 mg by mouth daily. Take with or immediately following a meal., Disp: , Rfl:  .  Multiple Vitamin (MULTIVITAMIN) tablet, Take 1 tablet by mouth daily.  , Disp: , Rfl:  .  nitroGLYCERIN (NITROSTAT) 0.4 MG SL tablet, Place 0.4 mg under the tongue every 5 (five) minutes as needed for chest pain., Disp: , Rfl:  .  Omega-3 Fatty Acids (OMEGA 3 PO), Take 1,000 mg by mouth daily. , Disp: , Rfl:   BP (!) 151/84   Pulse 64   Ht 5\' 7"  (1.702 m)   Wt 195 lb (88.5 kg)   BMI 30.54 kg/m   Physical Exam  Constitutional: He is oriented to person, place, and time. He appears well-developed and well-nourished. No distress.  Cardiovascular: Normal rate and intact distal pulses.   Neurological: He is alert and oriented to person, place, and time.  Skin: Skin is warm and dry. No rash noted. He is not diaphoretic. No erythema. No pallor.  Psychiatric: He has a normal mood and affect. His behavior is normal. Judgment and thought content normal.    Right Hip Exam  Right hip exam is normal.   Range of  Motion  The patient has normal right hip ROM.  Muscle Strength  Flexion: 3/5    Left Hip Exam  Left hip exam is normal.  Range of Motion  The patient has normal left hip ROM.  Muscle Strength  Flexion: 5/5    Back Exam   Tenderness  The patient is experiencing no tenderness.   Range of Motion  Extension: abnormal  Flexion: abnormal   Tests  Straight leg raise right: positive Straight leg raise left: negative  Other  Erythema: no back redness      Gait limping   Altered sensation right foot versus left. Weakness hip flexors and foot dorsiflexor on the right normal in the left  No back tenderness or pain    ASSESSMENT: My personal interpretation of the images:  Prior images showed  scoliosis and spondylosis lower lumbar spine   Encounter Diagnosis  Name Primary?  . Spinal stenosis of lumbar region Yes   Acute onset right lower extremity weakness history spinal stenosis  Sensory loss right leg PLAN MRI L spine   Arther Abbott, MD 09/04/2015 3:44 PM

## 2015-09-09 ENCOUNTER — Ambulatory Visit: Payer: Medicare HMO | Admitting: Orthopedic Surgery

## 2015-09-13 ENCOUNTER — Emergency Department (HOSPITAL_COMMUNITY): Payer: Medicare HMO

## 2015-09-13 ENCOUNTER — Inpatient Hospital Stay (HOSPITAL_COMMUNITY): Payer: Medicare HMO

## 2015-09-13 ENCOUNTER — Inpatient Hospital Stay (HOSPITAL_COMMUNITY)
Admission: EM | Admit: 2015-09-13 | Discharge: 2015-09-14 | DRG: 065 | Disposition: A | Discharge status: Home Health | Payer: Medicare HMO | Attending: Internal Medicine | Admitting: Internal Medicine

## 2015-09-13 ENCOUNTER — Encounter (HOSPITAL_COMMUNITY): Payer: Self-pay | Admitting: *Deleted

## 2015-09-13 DIAGNOSIS — I10 Essential (primary) hypertension: Secondary | ICD-10-CM | POA: Diagnosis not present

## 2015-09-13 DIAGNOSIS — Z8249 Family history of ischemic heart disease and other diseases of the circulatory system: Secondary | ICD-10-CM

## 2015-09-13 DIAGNOSIS — Z825 Family history of asthma and other chronic lower respiratory diseases: Secondary | ICD-10-CM | POA: Diagnosis not present

## 2015-09-13 DIAGNOSIS — E785 Hyperlipidemia, unspecified: Secondary | ICD-10-CM | POA: Diagnosis present

## 2015-09-13 DIAGNOSIS — R269 Unspecified abnormalities of gait and mobility: Secondary | ICD-10-CM | POA: Diagnosis not present

## 2015-09-13 DIAGNOSIS — G8194 Hemiplegia, unspecified affecting left nondominant side: Secondary | ICD-10-CM | POA: Diagnosis not present

## 2015-09-13 DIAGNOSIS — Z7902 Long term (current) use of antithrombotics/antiplatelets: Secondary | ICD-10-CM

## 2015-09-13 DIAGNOSIS — I6789 Other cerebrovascular disease: Secondary | ICD-10-CM | POA: Diagnosis not present

## 2015-09-13 DIAGNOSIS — R531 Weakness: Secondary | ICD-10-CM | POA: Diagnosis not present

## 2015-09-13 DIAGNOSIS — I63031 Cerebral infarction due to thrombosis of right carotid artery: Secondary | ICD-10-CM | POA: Diagnosis not present

## 2015-09-13 DIAGNOSIS — Z7982 Long term (current) use of aspirin: Secondary | ICD-10-CM

## 2015-09-13 DIAGNOSIS — Z87891 Personal history of nicotine dependence: Secondary | ICD-10-CM

## 2015-09-13 DIAGNOSIS — R260 Ataxic gait: Secondary | ICD-10-CM | POA: Diagnosis not present

## 2015-09-13 DIAGNOSIS — Z7984 Long term (current) use of oral hypoglycemic drugs: Secondary | ICD-10-CM

## 2015-09-13 DIAGNOSIS — I63032 Cerebral infarction due to thrombosis of left carotid artery: Secondary | ICD-10-CM | POA: Diagnosis not present

## 2015-09-13 DIAGNOSIS — R51 Headache: Secondary | ICD-10-CM | POA: Diagnosis not present

## 2015-09-13 DIAGNOSIS — I639 Cerebral infarction, unspecified: Secondary | ICD-10-CM | POA: Diagnosis not present

## 2015-09-13 DIAGNOSIS — I2510 Atherosclerotic heart disease of native coronary artery without angina pectoris: Secondary | ICD-10-CM | POA: Diagnosis not present

## 2015-09-13 DIAGNOSIS — E119 Type 2 diabetes mellitus without complications: Secondary | ICD-10-CM | POA: Diagnosis not present

## 2015-09-13 DIAGNOSIS — Z8673 Personal history of transient ischemic attack (TIA), and cerebral infarction without residual deficits: Secondary | ICD-10-CM

## 2015-09-13 DIAGNOSIS — Z85820 Personal history of malignant melanoma of skin: Secondary | ICD-10-CM

## 2015-09-13 DIAGNOSIS — Z66 Do not resuscitate: Secondary | ICD-10-CM | POA: Diagnosis present

## 2015-09-13 DIAGNOSIS — I69392 Facial weakness following cerebral infarction: Secondary | ICD-10-CM

## 2015-09-13 DIAGNOSIS — I252 Old myocardial infarction: Secondary | ICD-10-CM | POA: Diagnosis not present

## 2015-09-13 DIAGNOSIS — I6612 Occlusion and stenosis of left anterior cerebral artery: Secondary | ICD-10-CM | POA: Diagnosis not present

## 2015-09-13 HISTORY — DX: Cerebral infarction, unspecified: I63.9

## 2015-09-13 LAB — ECHOCARDIOGRAM COMPLETE
E/e' ratio: 10.64
EWDT: 352 ms
FS: 34 % (ref 28–44)
HEIGHTINCHES: 67 in
IVS/LV PW RATIO, ED: 1.11
LA ID, A-P, ES: 40 mm
LA diam end sys: 40 mm
LAVOL: 54.4 mL
LAVOLA4C: 58.2 mL
LV E/e' medial: 10.64
LV E/e'average: 10.64
LV TDI E'MEDIAL: 4.57
LV e' LATERAL: 7.29 cm/s
MV Dec: 352
MV pk A vel: 89.7 m/s
MVPG: 2 mmHg
MVPKEVEL: 77.6 m/s
PW: 12.2 mm — AB (ref 0.6–1.1)
RV TAPSE: 16.3 mm
TDI e' lateral: 7.29
WEIGHTICAEL: 3120 [oz_av]

## 2015-09-13 LAB — ETHANOL: Alcohol, Ethyl (B): 9 mg/dL — ABNORMAL HIGH (ref <5)

## 2015-09-13 LAB — COMPREHENSIVE METABOLIC PANEL
ALBUMIN: 4.4 g/dL (ref 3.5–5.0)
ALK PHOS: 36 U/L — AB (ref 38–126)
ALT: 34 U/L (ref 17–63)
AST: 22 U/L (ref 15–41)
Anion gap: 9 (ref 5–15)
BILIRUBIN TOTAL: 0.4 mg/dL (ref 0.3–1.2)
BUN: 17 mg/dL (ref 6–20)
CALCIUM: 9.5 mg/dL (ref 8.9–10.3)
CO2: 25 mmol/L (ref 22–32)
Chloride: 104 mmol/L (ref 101–111)
Creatinine, Ser: 1.02 mg/dL (ref 0.61–1.24)
GFR calc Af Amer: >60 mL/min (ref >60)
GFR calc non Af Amer: >60 mL/min (ref >60)
GLUCOSE: 211 mg/dL — AB (ref 65–99)
Potassium: 4 mmol/L (ref 3.5–5.1)
SODIUM: 138 mmol/L (ref 135–145)
TOTAL PROTEIN: 7.2 g/dL (ref 6.5–8.1)

## 2015-09-13 LAB — GLUCOSE, CAPILLARY
GLUCOSE-CAPILLARY: 116 mg/dL — AB (ref 65–99)
Glucose-Capillary: 96 mg/dL (ref 65–99)

## 2015-09-13 LAB — CBG MONITORING, ED
Glucose-Capillary: 206 mg/dL — ABNORMAL HIGH (ref 65–99)
Glucose-Capillary: 102 mg/dL — ABNORMAL HIGH (ref 65–99)

## 2015-09-13 LAB — DIFFERENTIAL
BASOS ABS: 0 10*3/uL (ref 0.0–0.1)
Basophils Relative: 1 %
Eosinophils Absolute: 0.3 10*3/uL (ref 0.0–0.7)
Eosinophils Relative: 6 %
LYMPHS ABS: 1.2 10*3/uL (ref 0.7–4.0)
LYMPHS PCT: 27 %
Monocytes Absolute: 0.4 10*3/uL (ref 0.1–1.0)
Monocytes Relative: 9 %
NEUTROS ABS: 2.5 10*3/uL (ref 1.7–7.7)
NEUTROS PCT: 57 %

## 2015-09-13 LAB — URINALYSIS, ROUTINE W REFLEX MICROSCOPIC
BILIRUBIN URINE: NEGATIVE
GLUCOSE, UA: NEGATIVE mg/dL
Hgb urine dipstick: NEGATIVE
KETONES UR: NEGATIVE mg/dL
LEUKOCYTES UA: NEGATIVE
NITRITE: NEGATIVE
PH: 6.5 (ref 5.0–8.0)
PROTEIN: NEGATIVE mg/dL
Specific Gravity, Urine: 1.01 (ref 1.005–1.030)

## 2015-09-13 LAB — CBC
HCT: 38.5 % — ABNORMAL LOW (ref 39.0–52.0)
HEMOGLOBIN: 12.6 g/dL — AB (ref 13.0–17.0)
MCH: 26.5 pg (ref 26.0–34.0)
MCHC: 32.7 g/dL (ref 30.0–36.0)
MCV: 80.9 fL (ref 78.0–100.0)
Platelets: 285 10*3/uL (ref 150–400)
RBC: 4.76 MIL/uL (ref 4.22–5.81)
RDW: 14.3 % (ref 11.5–15.5)
WBC: 4.4 10*3/uL (ref 4.0–10.5)

## 2015-09-13 LAB — I-STAT CHEM 8, ED
BUN: 16 mg/dL (ref 6–20)
CHLORIDE: 102 mmol/L (ref 101–111)
CREATININE: 1.1 mg/dL (ref 0.61–1.24)
Calcium, Ion: 1.25 mmol/L (ref 1.15–1.40)
Glucose, Bld: 207 mg/dL — ABNORMAL HIGH (ref 65–99)
HEMATOCRIT: 39 % (ref 39.0–52.0)
Hemoglobin: 13.3 g/dL (ref 13.0–17.0)
Potassium: 4 mmol/L (ref 3.5–5.1)
Sodium: 140 mmol/L (ref 135–145)
TCO2: 23 mmol/L (ref 0–100)

## 2015-09-13 LAB — RAPID URINE DRUG SCREEN, HOSP PERFORMED
AMPHETAMINES: NOT DETECTED
BARBITURATES: NOT DETECTED
Benzodiazepines: NOT DETECTED
COCAINE: NOT DETECTED
Opiates: NOT DETECTED
TETRAHYDROCANNABINOL: NOT DETECTED

## 2015-09-13 LAB — APTT: APTT: 29 s (ref 24–36)

## 2015-09-13 LAB — PROTIME-INR
INR: 0.97
Prothrombin Time: 12.9 seconds (ref 11.4–15.2)

## 2015-09-13 LAB — I-STAT TROPONIN, ED: Troponin i, poc: 0 ng/mL (ref 0.00–0.08)

## 2015-09-13 MED ORDER — TAMSULOSIN HCL 0.4 MG PO CAPS
0.4000 mg | ORAL_CAPSULE | Freq: Every day | ORAL | Status: DC
  Administered 2015-09-14: 0.4 mg via ORAL
  Filled 2015-09-13: qty 1

## 2015-09-13 MED ORDER — ATORVASTATIN CALCIUM 10 MG PO TABS
10.0000 mg | ORAL_TABLET | Freq: Every day | ORAL | Status: DC
  Administered 2015-09-13: 10 mg via ORAL
  Filled 2015-09-13: qty 1

## 2015-09-13 MED ORDER — CLOPIDOGREL BISULFATE 75 MG PO TABS
75.0000 mg | ORAL_TABLET | Freq: Every day | ORAL | Status: DC
  Administered 2015-09-14: 75 mg via ORAL
  Filled 2015-09-13: qty 1

## 2015-09-13 MED ORDER — SODIUM CHLORIDE 0.9% FLUSH
3.0000 mL | Freq: Two times a day (BID) | INTRAVENOUS | Status: DC
  Administered 2015-09-13: 3 mL via INTRAVENOUS

## 2015-09-13 MED ORDER — FENOFIBRATE 160 MG PO TABS
160.0000 mg | ORAL_TABLET | Freq: Every day | ORAL | Status: DC
  Administered 2015-09-13 – 2015-09-14 (×2): 160 mg via ORAL
  Filled 2015-09-13 (×2): qty 1

## 2015-09-13 MED ORDER — OMEGA-3-ACID ETHYL ESTERS 1 G PO CAPS
1.0000 | ORAL_CAPSULE | Freq: Every day | ORAL | Status: DC
  Administered 2015-09-14: 1 g via ORAL
  Filled 2015-09-13: qty 1

## 2015-09-13 MED ORDER — STROKE: EARLY STAGES OF RECOVERY BOOK
Freq: Once | Status: DC
  Filled 2015-09-13: qty 1

## 2015-09-13 MED ORDER — ACETAMINOPHEN 325 MG PO TABS: 650.0000 mg | ORAL_TABLET | Freq: Four times a day (QID) | ORAL | Status: DC | PRN

## 2015-09-13 MED ORDER — ACETAMINOPHEN 325 MG PO TABS: 650.0000 mg | ORAL_TABLET | Freq: Once | ORAL | Status: DC

## 2015-09-13 MED ORDER — METOPROLOL SUCCINATE ER 50 MG PO TB24
50.0000 mg | ORAL_TABLET | Freq: Every day | ORAL | Status: DC
  Administered 2015-09-13: 50 mg via ORAL
  Filled 2015-09-13: qty 1

## 2015-09-13 MED ORDER — ASPIRIN 325 MG PO TABS
325.0000 mg | ORAL_TABLET | Freq: Every day | ORAL | Status: DC
  Administered 2015-09-14: 325 mg via ORAL
  Filled 2015-09-13: qty 1

## 2015-09-13 MED ORDER — SODIUM CHLORIDE 0.9 % IV SOLN
INTRAVENOUS | Status: DC
  Administered 2015-09-13: 17:00:00 via INTRAVENOUS

## 2015-09-13 MED ORDER — HEPARIN SODIUM (PORCINE) 5000 UNIT/ML IJ SOLN
5000.0000 [IU] | Freq: Three times a day (TID) | INTRAMUSCULAR | Status: DC
  Administered 2015-09-13 – 2015-09-14 (×2): 5000 [IU] via SUBCUTANEOUS
  Filled 2015-09-13 (×3): qty 1

## 2015-09-13 MED ORDER — INSULIN ASPART 100 UNIT/ML ~~LOC~~ SOLN: 0.0000 [IU] | Freq: Three times a day (TID) | SUBCUTANEOUS | Status: DC

## 2015-09-13 MED ORDER — ACETAMINOPHEN 650 MG RE SUPP: 650.0000 mg | Freq: Four times a day (QID) | RECTAL | Status: DC | PRN

## 2015-09-13 MED ORDER — METFORMIN HCL 500 MG PO TABS
500.0000 mg | ORAL_TABLET | Freq: Two times a day (BID) | ORAL | Status: DC
  Administered 2015-09-13 – 2015-09-14 (×2): 500 mg via ORAL
  Filled 2015-09-13 (×2): qty 1

## 2015-09-13 MED ORDER — INSULIN ASPART 100 UNIT/ML ~~LOC~~ SOLN: 0.0000 [IU] | Freq: Every day | SUBCUTANEOUS | Status: DC

## 2015-09-13 MED ORDER — ADULT MULTIVITAMIN W/MINERALS CH
1.0000 | ORAL_TABLET | Freq: Every day | ORAL | Status: DC
  Filled 2015-09-13: qty 1

## 2015-09-13 MED ORDER — SENNOSIDES-DOCUSATE SODIUM 8.6-50 MG PO TABS: 1.0000 | ORAL_TABLET | Freq: Every evening | ORAL | Status: DC | PRN

## 2015-09-13 NOTE — Consult Note (Signed)
Steve A. Merlene Laughter, MD     [email or web address]          Steve Ramirez is an 80 y.o. male.   ASSESSMENT/PLAN: Simultaneous bilateral small infarcts worrisome for cardioembolic phenomena. Risk factors diabetes, age, previous ischemic events and hypertension.   Continue with dual antiplatelet agents for now although long-term this increases risk of hemorrhage without benefit. I would discontinue one agent at 3 months.  I would recommend a 30 day event monitor to evaluate for cardioembolic source particularly for atrial fibrillation.     The patient is a 80 year old white male who presents with acute onset of marked gait instability he late yesterday while he was at a local game. Symptoms progressively got worse and the patient was taken to the hospital this morning by his wife. He endorses having some mild headaches and dizziness described mostly as disequilibrium. He denies any slurring of his speech. He denies any chest pain or palpitation. No shortness of breath, GI or GU symptoms are reported. The patient has been on dual antiplatelet agents over the last year for unclear reasons. He tells her that he has had a heart attack in the past. No stenting has been placed however. The review of systems otherwise negative.     GENERAL: Severe pleasant male in no acute distress.  HEENT: Supple. Atraumatic normocephalic.   ABDOMEN: soft  EXTREMITIES: No edema   BACK: Normal.  SKIN: Normal by inspection.    MENTAL STATUS: Alert and oriented - he is oriented to month and age. Speech, language and cognition are generally intact. Judgment and insight normal.   CRANIAL NERVES: Pupils are equal, round and reactive to light and accommodation; extra ocular movements are full, there is no significant nystagmus; visual fields are full; upper and lower facial muscles are normal in strength and symmetric, there is no flattening of the nasolabial folds; tongue is midline;  uvula is midline; shoulder elevation is normal.  MOTOR: Normal tone, bulk and strength; no pronator drift.  COORDINATION: Left finger to nose is normal, right finger to nose is normal, No rest tremor; no intention tremor; no postural tremor; no bradykinesia.  REFLEXES: Deep tendon reflexes are symmetrical and normal. Babinski reflexes are flexor bilaterally.   SENSATION: Normal to light touch. No extinction to double simultaneous visual or tactile stimulation.   And a stroke scale 0.   Blood pressure (!) 153/76, pulse 63, temperature 98.2 F (36.8 C), temperature source Oral, resp. rate 20, height 5' 7"  (1.702 m), weight 192 lb 4.8 oz (87.2 kg), SpO2 97 %.  Past Medical History:  Diagnosis Date  . Coronary atherosclerosis of native coronary artery   . Essential hypertension, benign   . GIB (gastrointestinal bleeding)    Colonic diverticular bleed  . History of melanoma   . History of TIAs   . Type 2 diabetes mellitus ([place])     Past Surgical History:  Procedure Laterality Date  . COLONOSCOPY  08/13/2010   Procedure: COLONOSCOPY;  Surgeon: Rogene Houston, MD;  Location: [facility];  Service: Endoscopy;  Laterality: N/A;  . KNEE SURGERY    . LEFT HEART CATHETERIZATION WITH CORONARY ANGIOGRAM N/A 11/24/2010   Procedure: LEFT HEART CATHETERIZATION WITH CORONARY ANGIOGRAM;  Surgeon: Josue Hector, MD;  Location: [facility] CATH LAB;  Service: Cardiovascular;  Laterality: N/A;    Family History  Problem Relation Age of Onset  . Coronary artery disease Father   . Coronary artery disease Brother   . Asthma  Social History:  reports that he quit smoking about 35 years ago. His smoking use included Cigarettes. He has never used smokeless tobacco. He reports that he does not drink alcohol or use drugs.  Allergies: No Known Allergies  Medications: Prior to Admission medications   Medication Sig Start Date End Date Taking? Authorizing Provider  amLODipine (NORVASC) 10 MG tablet  Take 10 mg by mouth daily.   Yes Historical Provider, MD  aspirin 325 MG tablet Take 1 tablet (325 mg total) by mouth daily. 11/30/14  Yes Kathie Dike, MD  cetirizine (ZYRTEC) 10 MG tablet Take 10 mg by mouth daily.   Yes Historical Provider, MD  clopidogrel (PLAVIX) 75 MG tablet Take 1 tablet (75 mg total) by mouth daily. 08/08/12  Yes Lendon Colonel, NP  fenofibrate (TRICOR) 145 MG tablet Take 145 mg by mouth daily.   Yes Historical Provider, MD  lisinopril-hydrochlorothiazide (PRINZIDE,ZESTORETIC) 20-12.5 MG per tablet Take 1 tablet by mouth 2 (two) times daily.   Yes Historical Provider, MD  metFORMIN (GLUCOPHAGE) 500 MG tablet Take 500 mg by mouth 2 (two) times daily.   Yes Historical Provider, MD  metoprolol succinate (TOPROL-XL) 50 MG 24 hr tablet Take 50 mg by mouth daily. Take with or immediately following a meal.   Yes Historical Provider, MD  Multiple Vitamin (MULTIVITAMIN) tablet Take 1 tablet by mouth daily.     Yes Historical Provider, MD  nitroGLYCERIN (NITROSTAT) 0.4 MG SL tablet Place 0.4 mg under the tongue every 5 (five) minutes as needed for chest pain.   Yes Historical Provider, MD  Omega-3 Fatty Acids (OMEGA 3 PO) Take 1,000 mg by mouth daily.    Yes Historical Provider, MD  Saw Palmetto 450 MG CAPS Take 1 capsule by mouth daily.   Yes Historical Provider, MD  spironolactone (ALDACTONE) 25 MG tablet Take 25 mg by mouth daily.   Yes Historical Provider, MD  tamsulosin (FLOMAX) 0.4 MG CAPS capsule Take 0.4 mg by mouth daily.   Yes Historical Provider, MD    Scheduled Meds: .  stroke: mapping our early stages of recovery book   Does not apply Once  . acetaminophen  650 mg Oral Once  . [START ON 09/14/2015] aspirin  325 mg Oral Daily  . atorvastatin  10 mg Oral q1800  . [START ON 09/14/2015] clopidogrel  75 mg Oral Daily  . [START ON 09/14/2015] fenofibrate  160 mg Oral Daily  . heparin  5,000 Units Subcutaneous Q8H  . insulin aspart  0-15 Units Subcutaneous TID WC  .  insulin aspart  0-5 Units Subcutaneous QHS  . metFORMIN  500 mg Oral BID WC  . [START ON 09/14/2015] metoprolol succinate  50 mg Oral Daily  . [START ON 09/14/2015] multivitamin with minerals  1 tablet Oral Daily  . [START ON 09/14/2015] omega-3 acid ethyl esters  1 capsule Oral Daily  . sodium chloride flush  3 mL Intravenous Q12H  . [START ON 09/14/2015] tamsulosin  0.4 mg Oral Daily   Continuous Infusions: . sodium chloride 75 mL/hr at 09/13/15 1639   PRN Meds:.acetaminophen **OR** acetaminophen, senna-docusate     Results for orders placed or performed during the hospital encounter of 09/13/15 (from the past 48 hour(s))  POC CBG, ED     Status: Abnormal   Collection Time: 09/13/15  8:29 AM  Result Value Ref Range   Glucose-Capillary 206 (H) 65 - 99 mg/dL  Urine rapid drug screen (hosp performed)not at [facility]     Status: None  Collection Time: 09/13/15  9:06 AM  Result Value Ref Range   Opiates NONE DETECTED NONE DETECTED   Cocaine NONE DETECTED NONE DETECTED   Benzodiazepines NONE DETECTED NONE DETECTED   Amphetamines NONE DETECTED NONE DETECTED   Tetrahydrocannabinol NONE DETECTED NONE DETECTED   Barbiturates NONE DETECTED NONE DETECTED    Comment:        DRUG SCREEN FOR MEDICAL PURPOSES ONLY.  IF CONFIRMATION IS NEEDED FOR ANY PURPOSE, NOTIFY LAB WITHIN 5 DAYS.        LOWEST DETECTABLE LIMITS FOR URINE DRUG SCREEN Drug Class       Cutoff (ng/mL) Amphetamine      1000 Barbiturate      200 Benzodiazepine   300 Tricyclics       923 Opiates          300 Cocaine          300 THC              50   Urinalysis, Routine w reflex microscopic (not at [facility])     Status: None   Collection Time: 09/13/15  9:06 AM  Result Value Ref Range   Color, Urine YELLOW YELLOW   APPearance CLEAR CLEAR   Specific Gravity, Urine 1.010 1.005 - 1.030   pH 6.5 5.0 - 8.0   Glucose, UA NEGATIVE NEGATIVE mg/dL   Hgb urine dipstick NEGATIVE NEGATIVE   Bilirubin Urine NEGATIVE NEGATIVE   Ketones, ur  NEGATIVE NEGATIVE mg/dL   Protein, ur NEGATIVE NEGATIVE mg/dL   Nitrite NEGATIVE NEGATIVE   Leukocytes, UA NEGATIVE NEGATIVE    Comment: MICROSCOPIC NOT DONE ON URINES WITH NEGATIVE PROTEIN, BLOOD, LEUKOCYTES, NITRITE, OR GLUCOSE <1000 mg/dL.  Ethanol     Status: Abnormal   Collection Time: 09/13/15  9:14 AM  Result Value Ref Range   Alcohol, Ethyl (B) 9 (H) <5 mg/dL    Comment:        LOWEST DETECTABLE LIMIT FOR SERUM ALCOHOL IS 5 mg/dL FOR MEDICAL PURPOSES ONLY   Protime-INR     Status: None   Collection Time: 09/13/15  9:14 AM  Result Value Ref Range   Prothrombin Time 12.9 11.4 - 15.2 seconds   INR 0.97   APTT     Status: None   Collection Time: 09/13/15  9:14 AM  Result Value Ref Range   aPTT 29 24 - 36 seconds  CBC     Status: Abnormal   Collection Time: 09/13/15  9:14 AM  Result Value Ref Range   WBC 4.4 4.0 - 10.5 K/uL   RBC 4.76 4.22 - 5.81 MIL/uL   Hemoglobin 12.6 (L) 13.0 - 17.0 g/dL   HCT 38.5 (L) 39.0 - 52.0 %   MCV 80.9 78.0 - 100.0 fL   MCH 26.5 26.0 - 34.0 pg   MCHC 32.7 30.0 - 36.0 g/dL   RDW 14.3 11.5 - 15.5 %   Platelets 285 150 - 400 K/uL  Differential     Status: None   Collection Time: 09/13/15  9:14 AM  Result Value Ref Range   Neutrophils Relative % 57 %   Neutro Abs 2.5 1.7 - 7.7 K/uL   Lymphocytes Relative 27 %   Lymphs Abs 1.2 0.7 - 4.0 K/uL   Monocytes Relative 9 %   Monocytes Absolute 0.4 0.1 - 1.0 K/uL   Eosinophils Relative 6 %   Eosinophils Absolute 0.3 0.0 - 0.7 K/uL   Basophils Relative 1 %   Basophils Absolute 0.0 0.0 -  0.1 K/uL  Comprehensive metabolic panel     Status: Abnormal   Collection Time: 09/13/15  9:14 AM  Result Value Ref Range   Sodium 138 135 - 145 mmol/L   Potassium 4.0 3.5 - 5.1 mmol/L   Chloride 104 101 - 111 mmol/L   CO2 25 22 - 32 mmol/L   Glucose, Bld 211 (H) 65 - 99 mg/dL   BUN 17 6 - 20 mg/dL   Creatinine, Ser 1.02 0.61 - 1.24 mg/dL   Calcium 9.5 8.9 - 10.3 mg/dL   Total Protein 7.2 6.5 - 8.1 g/dL    Albumin 4.4 3.5 - 5.0 g/dL   AST 22 15 - 41 U/L   ALT 34 17 - 63 U/L   Alkaline Phosphatase 36 (L) 38 - 126 U/L   Total Bilirubin 0.4 0.3 - 1.2 mg/dL   GFR calc non Af Amer >60 >60 mL/min   GFR calc Af Amer >60 >60 mL/min    Comment: (NOTE) The eGFR has been calculated using the CKD EPI equation. This calculation has not been validated in all clinical situations. eGFR's persistently <60 mL/min signify possible Chronic Kidney Disease.    Anion gap 9 5 - 15  I-stat troponin, ED (not at [facility], [facility])     Status: None   Collection Time: 09/13/15  9:14 AM  Result Value Ref Range   Troponin i, poc 0.00 0.00 - 0.08 ng/mL   Comment 3            Comment: Due to the release kinetics of cTnI, a negative result within the first hours of the onset of symptoms does not rule out myocardial infarction with certainty. If myocardial infarction is still suspected, repeat the test at appropriate intervals.   I-Stat Chem 8, ED  (not at [facility], [facility])     Status: Abnormal   Collection Time: 09/13/15  9:16 AM  Result Value Ref Range   Sodium 140 135 - 145 mmol/L   Potassium 4.0 3.5 - 5.1 mmol/L   Chloride 102 101 - 111 mmol/L   BUN 16 6 - 20 mg/dL   Creatinine, Ser 1.10 0.61 - 1.24 mg/dL   Glucose, Bld 207 (H) 65 - 99 mg/dL   Calcium, Ion 1.25 1.15 - 1.40 mmol/L   TCO2 23 0 - 100 mmol/L   Hemoglobin 13.3 13.0 - 17.0 g/dL   HCT 39.0 39.0 - 52.0 %  POC CBG, ED     Status: Abnormal   Collection Time: 09/13/15  1:06 PM  Result Value Ref Range   Glucose-Capillary 102 (H) 65 - 99 mg/dL  Glucose, capillary     Status: None   Collection Time: 09/13/15  4:18 PM  Result Value Ref Range   Glucose-Capillary 96 65 - 99 mg/dL   Comment 1 Notify RN    Comment 2 Document in Chart     Studies/Results:  CAROTID DOPPLERS unremarkable   TTE - Left ventricle: Difficult acoustic windows make evaluation   difficult LVEF is approximately 40 to 45% with hypokinesis of the   inferior/inferoseptal walls. This is  new from report of echo of   November 2016. The cavity size was normal. Wall thickness was   increased in a pattern of mild LVH. Doppler parameters are   consistent with abnormal left ventricular relaxation (grade 1   diastolic dysfunction). - Aortic valve: There was mild regurgitation. - Left atrium: The atrium was mildly dilated.    BRAIN MRA Stable antegrade flow in the posterior circulation.  Dominant distal left vertebral. Stable bilateral PICA flow. Stable basilar artery tortuosity without significant stenosis. SCA and PCA origins are stable and within normal limits. Tortuous left P1 segment. Posterior communicating arteries are diminutive or absent. Stable bilateral PCA branches.  Stable antegrade flow in both ICA siphons. Normal ophthalmic artery origins. Mild to moderate left siphon stenosis at the anterior genu is stable. Patent carotid termini. Stenotic or non dominant left ACA A1 segment is stable. Otherwise normal MCA and ACA origins. Diminutive or absent anterior communicating artery. Stable and negative visualized ACA branches. Stable visualized bilateral MCA branches with mild irregularity.  IMPRESSION: 1. Stable intracranial MRA findings. 2. Intracranial atherosclerosis with no significant posterior circulation stenosis. Up to moderate stenosis of the left ICA Siphon.    BRAIN MRI FINDINGS: Acute infarct right lateral pons measuring approximately 1 cm.  3 mm acute infarct in the left medial frontal white matter.  Extensive periventricular and deep white matter hyperintensity bilaterally compatible with chronic microvascular ischemia. Chronic lacunar infarction left thalamus. Chronic lacunar infarction head of caudate on the left and left putamen. These findings are similar to 2016. Mild chronic ischemia in the pons. Small chronic infarct left cerebellum.  Negative for hemorrhage or fluid collection  Negative for mass or edema.  No shift of the  midline structures  Pituitary normal in size.  Mucosal edema paranasal sinuses.  No orbital lesion.  IMPRESSION: Acute infarct right pontine thalamus  Small acute infarct left medial frontal white matter  Extensive chronic microvascular ischemic changes stable since 2016.       The brain MRI is reviewed in person shows an acute infarct involving the right lateral pontine region. Additionally, there is a tiny infarct involving the left frontal region. There are chronic infarcts involving the left basal ganglia and left thalamic areas. There is rather pronounced confluent leukoencephalopathy consistent with chronic ischemia. No hemorrhages appreciated.   Jaxson Keener A. Merlene Ramirez, M.D.  Diplomate, Tax adviser of Psychiatry and Neurology ( Neurology). 09/13/2015, 7:49 PM

## 2015-09-13 NOTE — ED Notes (Signed)
cbg of 206.

## 2015-09-13 NOTE — ED Notes (Signed)
Patient transported to MRI 

## 2015-09-13 NOTE — ED Provider Notes (Signed)
Medical screening examination/treatment/procedure(s) were conducted as a shared visit with non-physician practitioner(s) and myself.  I personally evaluated the patient during the encounter.   EKG Interpretation  Date/Time:  Friday September 13 2015 08:21:42 EDT Ventricular Rate:  70 PR Interval:    QRS Duration: 170 QT Interval:  429 QTC Calculation: 463 R Axis:   -46 Text Interpretation:  Sinus rhythm Prolonged PR interval RBBB and LAFB No significant change since last tracing Confirmed by Zaide Mcclenahan  MD, Zahriah Roes [phone]) on 09/13/2015 9:04:54 AM       Results for orders placed or performed during the hospital encounter of 09/13/15  Ethanol  Result Value Ref Range   Alcohol, Ethyl (B) 9 (H) <5 mg/dL  Protime-INR  Result Value Ref Range   Prothrombin Time 12.9 11.4 - 15.2 seconds   INR 0.97   APTT  Result Value Ref Range   aPTT 29 24 - 36 seconds  CBC  Result Value Ref Range   WBC 4.4 4.0 - 10.5 K/uL   RBC 4.76 4.22 - 5.81 MIL/uL   Hemoglobin 12.6 (L) 13.0 - 17.0 g/dL   HCT 38.5 (L) 39.0 - 52.0 %   MCV 80.9 78.0 - 100.0 fL   MCH 26.5 26.0 - 34.0 pg   MCHC 32.7 30.0 - 36.0 g/dL   RDW 14.3 11.5 - 15.5 %   Platelets 285 150 - 400 K/uL  Differential  Result Value Ref Range   Neutrophils Relative % 57 %   Neutro Abs 2.5 1.7 - 7.7 K/uL   Lymphocytes Relative 27 %   Lymphs Abs 1.2 0.7 - 4.0 K/uL   Monocytes Relative 9 %   Monocytes Absolute 0.4 0.1 - 1.0 K/uL   Eosinophils Relative 6 %   Eosinophils Absolute 0.3 0.0 - 0.7 K/uL   Basophils Relative 1 %   Basophils Absolute 0.0 0.0 - 0.1 K/uL  Comprehensive metabolic panel  Result Value Ref Range   Sodium 138 135 - 145 mmol/L   Potassium 4.0 3.5 - 5.1 mmol/L   Chloride 104 101 - 111 mmol/L   CO2 25 22 - 32 mmol/L   Glucose, Bld 211 (H) 65 - 99 mg/dL   BUN 17 6 - 20 mg/dL   Creatinine, Ser 1.02 0.61 - 1.24 mg/dL   Calcium 9.5 8.9 - 10.3 mg/dL   Total Protein 7.2 6.5 - 8.1 g/dL   Albumin 4.4 3.5 - 5.0 g/dL   AST 22 15 -  41 U/L   ALT 34 17 - 63 U/L   Alkaline Phosphatase 36 (L) 38 - 126 U/L   Total Bilirubin 0.4 0.3 - 1.2 mg/dL   GFR calc non Af Amer >60 >60 mL/min   GFR calc Af Amer >60 >60 mL/min   Anion gap 9 5 - 15  Urine rapid drug screen (hosp performed)not at [facility]  Result Value Ref Range   Opiates NONE DETECTED NONE DETECTED   Cocaine NONE DETECTED NONE DETECTED   Benzodiazepines NONE DETECTED NONE DETECTED   Amphetamines NONE DETECTED NONE DETECTED   Tetrahydrocannabinol NONE DETECTED NONE DETECTED   Barbiturates NONE DETECTED NONE DETECTED  POC CBG, ED  Result Value Ref Range   Glucose-Capillary 206 (H) 65 - 99 mg/dL  I-Stat Chem 8, ED  (not at [facility])  Result Value Ref Range   Sodium 140 135 - 145 mmol/L   Potassium 4.0 3.5 - 5.1 mmol/L   Chloride 102 101 - 111 mmol/L   BUN 16 6 - 20 mg/dL  Creatinine, Ser 1.10 0.61 - 1.24 mg/dL   Glucose, Bld 207 (H) 65 - 99 mg/dL   Calcium, Ion 1.25 1.15 - 1.40 mmol/L   TCO2 23 0 - 100 mmol/L   Hemoglobin 13.3 13.0 - 17.0 g/dL   HCT 39.0 39.0 - 52.0 %  I-stat troponin, ED (not at [facility], [facility])  Result Value Ref Range   Troponin i, poc 0.00 0.00 - 0.08 ng/mL   Comment 3           Ct Head Wo Contrast  Result Date: 09/13/2015 CLINICAL DATA:  Unsteady gait with speech difficulty and inability to ambulate today. Initial encounter. EXAM: CT HEAD WITHOUT CONTRAST TECHNIQUE: Contiguous axial images were obtained from the base of the skull through the vertex without intravenous contrast. COMPARISON:  Head CT and MRI 11/29/2014 FINDINGS: Brain: There is no evidence of acute intracranial hemorrhage, mass lesion, brain edema or extra-axial fluid collection. The ventricles and subarachnoid spaces are appropriately sized for age. There is no CT evidence of acute cortical infarction. Extensive chronic small vessel ischemic changes in the periventricular white matter and deep gray matter bilaterally are unchanged. Vascular: Intracranial vascular calcifications are  noted. Skull: Negative for fracture or focal lesion. Sinuses/Orbits: The visualized paranasal sinuses and mastoid air cells are clear. Other: None. IMPRESSION: 1. No acute intracranial findings seen. 2. Stable chronic small vessel ischemic changes. Electronically Signed   By: Richardean Sale M.D.   On: 09/13/2015 10:21   The patient seen by me along with the physician assistant. Patient presents for concerns for stroke. Patient's wife noted that at 7 this morning his speech was normal and by 7:30 it was abnormal he was having trouble finding words and sometimes in the words didn't sound right. Seem to be appropriate words though. 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 Patient's had a history of mini strokes in the past most recently a year ago. Patient has diabetes.  Patient not currently followed by neurology. But has seen them one time in the past.  Patient's speech pattern is improving as per his wife. While lying down on neuro exam without any significant findings. However physician assistant tried to sit him up and he had a lot of trouble with coordinating that.  Head CT without any acute findings. Patient will require MRI and is to require admission for presumed stroke.  Patient not a candidate for TPA 1 based on the time he had symptoms greater than 8 hours ago. And also for the new symptom of some speech problem it is improving rapidly and therefore would not be a candidate for TPA for that as well.    Fredia Sorrow, MD 09/13/15 1038

## 2015-09-13 NOTE — Progress Notes (Signed)
*  PRELIMINARY RESULTS* Echocardiogram 2D Echocardiogram has been performed.  Leavy Cella 09/13/2015, 3:59 PM

## 2015-09-13 NOTE — H&P (Signed)
History and Physical    Steve Ramirez [id number] DOB: [birth year] DOA: 09/13/2015  PCP: Purvis Kilts, MD  Patient coming from: home  Chief Complaint: acute cva  HPI: Steve Ramirez is a 80 y.o. 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 Patient states his coworkers thought patient looked "bad" and patient subsequently went home afterwards. At home, patient reports continued lower extremity weakness with resultant difficulty ambulating. Symptoms did not change and persisted all night. The following morning at around 6 AM, patient reported sudden change in speech and a new facial droop. Patient later presented for further workup  ED Course: In the emergency department, initial head CT was found to be unremarkable. Follow-up MRI of the brain demonstrated an acute right lateral thalamic infarct as well as an acute small left medial frontal infarct. Patient did pass a bedside swallow eval. Hospitalist service consulted for further workup.  Review of Systems:  Review of Systems  Constitutional: Negative for chills and fever.  HENT: Negative for ear discharge and ear pain.   Eyes: Negative for double vision and photophobia.  Respiratory: Negative for sputum production and shortness of breath.   Cardiovascular: Negative for chest pain and palpitations.  Gastrointestinal: Negative for abdominal pain, nausea and vomiting.  Genitourinary: Negative for frequency and hematuria.  Musculoskeletal: Positive for falls. Negative for joint pain.  Skin: Negative for itching and rash.  Neurological: Positive for speech change, focal weakness and weakness. Negative for tremors, seizures and loss of consciousness.  Endo/Heme/Allergies: Negative for polydipsia. Bruises/bleeds easily.    Psychiatric/Behavioral: Negative for hallucinations, memory loss and substance abuse.    Past Medical History:  Diagnosis Date  . Coronary atherosclerosis of native coronary artery   . Essential hypertension, benign   . GIB (gastrointestinal bleeding)    Colonic diverticular bleed  . History of melanoma   . History of TIAs   . Type 2 diabetes mellitus ([place])     Past Surgical History:  Procedure Laterality Date  . COLONOSCOPY  08/13/2010   Procedure: COLONOSCOPY;  Surgeon: Rogene Houston, MD;  Location: [facility];  Service: Endoscopy;  Laterality: N/A;  . KNEE SURGERY    . LEFT HEART CATHETERIZATION WITH CORONARY ANGIOGRAM N/A 11/24/2010   Procedure: LEFT HEART CATHETERIZATION WITH CORONARY ANGIOGRAM;  Surgeon: Josue Hector, MD;  Location: [facility] CATH LAB;  Service: Cardiovascular;  Laterality: N/A;     reports that he quit smoking about 35 years ago. His smoking use included Cigarettes. He has never used smokeless tobacco. He reports that he does not drink alcohol or use drugs.  No Known Allergies  Family History  Problem Relation Age of Onset  . Coronary artery disease Father   . Coronary artery disease Brother   . Asthma      Prior to Admission medications   Medication Sig Start Date End Date Taking? Authorizing Provider  amLODipine (NORVASC) 10 MG tablet Take 10 mg by mouth daily.   Yes Historical Provider, MD  aspirin 325 MG tablet Take 1 tablet (325 mg total) by mouth daily. 11/30/14  Yes Kathie Dike, MD  cetirizine (ZYRTEC) 10 MG tablet Take 10 mg by mouth daily.   Yes Historical Provider, MD  clopidogrel (PLAVIX) 75 MG tablet Take 1 tablet (75 mg total) by mouth daily. 08/08/12  Yes  Lendon Colonel, NP  fenofibrate (TRICOR) 145 MG tablet Take 145 mg by mouth daily.   Yes Historical Provider, MD  lisinopril-hydrochlorothiazide (PRINZIDE,ZESTORETIC) 20-12.5 MG per tablet Take 1 tablet by mouth 2 (two) times daily.   Yes Historical Provider, MD  metFORMIN  (GLUCOPHAGE) 500 MG tablet Take 500 mg by mouth 2 (two) times daily.   Yes Historical Provider, MD  metoprolol succinate (TOPROL-XL) 50 MG 24 hr tablet Take 50 mg by mouth daily. Take with or immediately following a meal.   Yes Historical Provider, MD  Multiple Vitamin (MULTIVITAMIN) tablet Take 1 tablet by mouth daily.     Yes Historical Provider, MD  nitroGLYCERIN (NITROSTAT) 0.4 MG SL tablet Place 0.4 mg under the tongue every 5 (five) minutes as needed for chest pain.   Yes Historical Provider, MD  Omega-3 Fatty Acids (OMEGA 3 PO) Take 1,000 mg by mouth daily.    Yes Historical Provider, MD  Saw Palmetto 450 MG CAPS Take 1 capsule by mouth daily.   Yes Historical Provider, MD  spironolactone (ALDACTONE) 25 MG tablet Take 25 mg by mouth daily.   Yes Historical Provider, MD  tamsulosin (FLOMAX) 0.4 MG CAPS capsule Take 0.4 mg by mouth daily.   Yes Historical Provider, MD    Physical Exam: Vitals:   09/13/15 1000 09/13/15 1015 09/13/15 1030 09/13/15 1045  BP: 129/83     Pulse:  (!) 59 60 61  Resp:  20 21 13   Temp:      TempSrc:      SpO2:  95% 96% 97%  Weight:      Height:        Constitutional: NAD, calm, comfortable Vitals:   09/13/15 1000 09/13/15 1015 09/13/15 1030 09/13/15 1045  BP: 129/83     Pulse:  (!) 59 60 61  Resp:  20 21 13   Temp:      TempSrc:      SpO2:  95% 96% 97%  Weight:      Height:       Eyes: PERRL, lids and conjunctivae normal ENMT: Mucous membranes are moist. Posterior pharynx clear of any exudate or lesions.Normal dentition.  Neck: normal, supple, no masses, no thyromegaly Respiratory: clear to auscultation bilaterally, no wheezing, no crackles. Normal respiratory effort. No accessory muscle use.  Cardiovascular: Regular rate and rhythm, No extremity edema. 2+ pedal pulses. No carotid bruits.  Abdomen: no tenderness, no masses palpated. No hepatosplenomegaly. Bowel sounds positive.  Musculoskeletal: no clubbing / cyanosis. No joint deformity upper and  lower extremities. Good ROM, no contractures. Normal muscle tone.  Skin: no rashes, lesions, ulcers. No induration Neurologic: Mild facial droop on exam, left side slightly weaker than right. Sensation intact throughout, No tremors noted Psychiatric: Normal judgment and insight. Alert and oriented x 3. Normal mood.    Labs on Admission: I have personally reviewed following labs and imaging studies  CBC:  Recent Labs Lab 09/13/15 0914 09/13/15 0916  WBC 4.4  --   NEUTROABS 2.5  --   HGB 12.6* 13.3  HCT 38.5* 39.0  MCV 80.9  --   PLT 285  --    Basic Metabolic Panel:  Recent Labs Lab 09/13/15 0914 09/13/15 0916  NA 138 140  K 4.0 4.0  CL 104 102  CO2 25  --   GLUCOSE 211* 207*  BUN 17 16  CREATININE 1.02 1.10  CALCIUM 9.5  --    GFR: Estimated Creatinine Clearance: 59.7 mL/min (by C-G formula based on SCr  of 1.1 mg/dL). Liver Function Tests:  Recent Labs Lab 09/13/15 0914  AST 22  ALT 34  ALKPHOS 36*  BILITOT 0.4  PROT 7.2  ALBUMIN 4.4   No results for input(s): LIPASE, AMYLASE in the last 168 hours. No results for input(s): AMMONIA in the last 168 hours. Coagulation Profile:  Recent Labs Lab 09/13/15 0914  INR 0.97   Cardiac Enzymes: No results for input(s): CKTOTAL, CKMB, CKMBINDEX, TROPONINI in the last 168 hours. BNP (last 3 results) No results for input(s): PROBNP in the last 8760 hours. HbA1C: No results for input(s): HGBA1C in the last 72 hours. CBG:  Recent Labs Lab 09/13/15 0829 09/13/15 1306  GLUCAP 206* 102*   Lipid Profile: No results for input(s): CHOL, HDL, LDLCALC, TRIG, CHOLHDL, LDLDIRECT in the last 72 hours. Thyroid Function Tests: No results for input(s): TSH, T4TOTAL, FREET4, T3FREE, THYROIDAB in the last 72 hours. Anemia Panel: No results for input(s): VITAMINB12, FOLATE, FERRITIN, TIBC, IRON, RETICCTPCT in the last 72 hours. Urine analysis:    Component Value Date/Time   COLORURINE YELLOW 09/13/2015 0906    APPEARANCEUR CLEAR 09/13/2015 0906   LABSPEC 1.010 09/13/2015 0906   PHURINE 6.5 09/13/2015 0906   GLUCOSEU NEGATIVE 09/13/2015 0906   HGBUR NEGATIVE 09/13/2015 0906   BILIRUBINUR NEGATIVE 09/13/2015 0906   KETONESUR NEGATIVE 09/13/2015 0906   PROTEINUR NEGATIVE 09/13/2015 0906   UROBILINOGEN 0.2 04/15/2010 1425   NITRITE NEGATIVE 09/13/2015 0906   LEUKOCYTESUR NEGATIVE 09/13/2015 0906   Sepsis Labs: !!!!!!!!!!!!!!!!!!!!!!!!!!!!!!!!!!!!!!!!!!!! @LABRCNTIP (procalcitonin:4,lacticidven:4) )No results found for this or any previous visit (from the past 240 hour(s)).   Radiological Exams on Admission: Ct Head Wo Contrast  Result Date: 09/13/2015 CLINICAL DATA:  Unsteady gait with speech difficulty and inability to ambulate today. Initial encounter. EXAM: CT HEAD WITHOUT CONTRAST TECHNIQUE: Contiguous axial images were obtained from the base of the skull through the vertex without intravenous contrast. COMPARISON:  Head CT and MRI 11/29/2014 FINDINGS: Brain: There is no evidence of acute intracranial hemorrhage, mass lesion, brain edema or extra-axial fluid collection. The ventricles and subarachnoid spaces are appropriately sized for age. There is no CT evidence of acute cortical infarction. Extensive chronic small vessel ischemic changes in the periventricular white matter and deep gray matter bilaterally are unchanged. Vascular: Intracranial vascular calcifications are noted. Skull: Negative for fracture or focal lesion. Sinuses/Orbits: The visualized paranasal sinuses and mastoid air cells are clear. Other: None. IMPRESSION: 1. No acute intracranial findings seen. 2. Stable chronic small vessel ischemic changes. Electronically Signed   By: Richardean Sale M.D.   On: 09/13/2015 10:21   Mr Brain Wo Contrast  Result Date: 09/13/2015 CLINICAL DATA:  Extremity weakness. Rule out TIA. Diabetes and hypertension. EXAM: MRI HEAD WITHOUT CONTRAST TECHNIQUE: Multiplanar, multiecho pulse sequences of the  brain and surrounding structures were obtained without intravenous contrast. COMPARISON:  CT 09/13/2015.  MRI 11/29/2014 FINDINGS: Acute infarct right lateral pons measuring approximately 1 cm. 3 mm acute infarct in the left medial frontal white matter. Extensive periventricular and deep white matter hyperintensity bilaterally compatible with chronic microvascular ischemia. Chronic lacunar infarction left thalamus. Chronic lacunar infarction head of caudate on the left and left putamen. These findings are similar to 2016. Mild chronic ischemia in the pons. Small chronic infarct left cerebellum. Negative for hemorrhage or fluid collection Negative for mass or edema.  No shift of the midline structures Pituitary normal in size. Mucosal edema paranasal sinuses.  No orbital lesion. IMPRESSION: Acute infarct right lateral thalamus Small acute infarct left medial  frontal white matter Extensive chronic microvascular ischemic changes stable since 2016. Electronically Signed   By: Franchot Gallo M.D.   On: 09/13/2015 11:38    EKG: Independently reviewed. NSR  Assessment/Plan Principal Problem:   Acute CVA (cerebrovascular accident) ([place]) Active Problems:   Essential hypertension, benign   Type 2 diabetes mellitus ([place])   Hyperlipidemia   History of TIAs   Coronary atherosclerosis of native coronary artery   1. Acute CVA 1. Acute right lateral thalamic as well as small acute left medial frontal infarcts noted on MRI 2. Carotid Dopplers last done approximately 1 year ago with around 50% stenosis 3. 2-D echocardiogram was done approximately 1 year ago as well 4. Given confirmed acute CVA, will repeat carotid Doppler and 2-D echocardiogram 5. Will order MRA of the head 6. Consult physical therapy and speech pathology 7. We'll check hemoglobin A1c and lipid panel 8. We will limit patient's home blood pressure regimen to allow for permissive hypertension 9. Continue with neuro checks while on  floor 10. Continue on telemetry to monitor for any arrhythmias 2. HTN 1. Blood pressure currently stable 2. We'll continue patient only on beta blocker for now, to allow for permissive hypertension per above 3. DM2 1. We'll check hemoglobin A1c 2. Continue patient on metformin per home regimen 3. Continue sliding-scale regimen as needed 4. HLD 1. We'll check lipid panel 2. Patient on fenofibrate as well as omega-3 fatty acids prior to admission 3. Will start patient on low dose statin 5. CAD 1. Chest pain free at present 2. Seems stable 3. Cont ASA and plavix per above  DVT prophylaxis: Heparin subcutaneous  Code Status: DO NOT RESUSCITATE Family Communication: Patient, wife at bedside  Disposition Plan: Uncertain at this time  Consults called: Neurology Admission status: Admission to med telemetry, inpatient   Jolanda Mccann, Orpah Melter MD [facility] Pager [phone]  If 7PM-7AM, please contact night-coverage [email or web address] Password [email or web address]  09/13/2015, 2:13 PM

## 2015-09-13 NOTE — ED Triage Notes (Signed)
Patient states he had some leg weakness last week and is scheduled for an mri, last night he got progressively weak and was unable to ambulate with holding on to something, today his wife noted some trouble with speech and decided to come in for a check up

## 2015-09-13 NOTE — ED Notes (Signed)
Patient transported to CT 

## 2015-09-13 NOTE — ED Provider Notes (Signed)
[facility] DEPT Provider Note   CSN: [id number] Arrival date & time: 09/13/15  0808     History   Chief Complaint Chief Complaint  Patient presents with  . Weakness    HPI LEEMAN KERWICK is a 80 y.o. male with a history CAD with prior MI, TIA,  HTN, h/o gi bleed and Type 2 DM presenting with weakness.  He describes having weakness in his bilateral lower legs for the past 3 weeks, was at the beach and when he stood up,  His left leg collapsed and he was unable to weight bear for a period of time afterward, with slow improvement over the rest of the weekend.  He was seen by orthopedics for this and is scheduled for an mri of his lumbar spine next week with concern about a possible neuropathy secondary to spinal stenosis.  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  This am he reports feeling weak in all of his extremities and wife has also noticed a slight slur to his speech but not as pronounced as his prior tia sx.  He denies chest pain, sob, headache, dizziness or lightheadedness.  His cbg's have been consistently in the 100's. He denies fevers, chills, any recent illness.   The history is provided by the patient and the spouse.    Past Medical History:  Diagnosis Date  . Coronary atherosclerosis of native coronary artery   . Essential hypertension, benign   . GIB (gastrointestinal bleeding)    Colonic diverticular bleed  . History of melanoma   . History of TIAs   . Type 2 diabetes mellitus [facility])     Patient Active Problem List   Diagnosis Date Noted  . TIA (transient ischemic attack) 11/29/2014  . Shoulder stiffness 09/05/2012  . Right rotator cuff tear 08/16/2012  . Shoulder subluxation, right 08/09/2012  . Elevated transaminase level 11/05/2011  . Coronary atherosclerosis of native coronary artery 11/26/2010  . Essential hypertension, benign 11/23/2010  . Type 2 diabetes mellitus ([place])  11/23/2010  . Hyperlipidemia 11/23/2010  . History of TIAs 11/23/2010    Past Surgical History:  Procedure Laterality Date  . COLONOSCOPY  08/13/2010   Procedure: COLONOSCOPY;  Surgeon: Rogene Houston, MD;  Location: [facility];  Service: Endoscopy;  Laterality: N/A;  . KNEE SURGERY    . LEFT HEART CATHETERIZATION WITH CORONARY ANGIOGRAM N/A 11/24/2010   Procedure: LEFT HEART CATHETERIZATION WITH CORONARY ANGIOGRAM;  Surgeon: Josue Hector, MD;  Location[facility] CATH LAB;  Service: Cardiovascular;  Laterality: N/A;       Home Medications    Prior to Admission medications   Medication Sig Start Date End Date Taking? Authorizing Provider  amLODipine (NORVASC) 10 MG tablet Take 10 mg by mouth daily.   Yes Historical Provider, MD  aspirin 325 MG tablet Take 1 tablet (325 mg total) by mouth daily. 11/30/14  Yes Kathie Dike, MD  cetirizine (ZYRTEC) 10 MG tablet Take 10 mg by mouth daily.   Yes Historical Provider, MD  clopidogrel (PLAVIX) 75 MG tablet Take 1 tablet (75 mg total) by mouth daily. 08/08/12  Yes Lendon Colonel, NP  fenofibrate (TRICOR) 145 MG tablet Take 145 mg by mouth daily.   Yes Historical Provider, MD  lisinopril-hydrochlorothiazide (PRINZIDE,ZESTORETIC) 20-12.5 MG per tablet Take 1 tablet by mouth 2 (two) times daily.   Yes Historical Provider, MD  metFORMIN (GLUCOPHAGE) 500 MG tablet Take 500 mg by  mouth 2 (two) times daily.   Yes Historical Provider, MD  metoprolol succinate (TOPROL-XL) 50 MG 24 hr tablet Take 50 mg by mouth daily. Take with or immediately following a meal.   Yes Historical Provider, MD  Multiple Vitamin (MULTIVITAMIN) tablet Take 1 tablet by mouth daily.     Yes Historical Provider, MD  nitroGLYCERIN (NITROSTAT) 0.4 MG SL tablet Place 0.4 mg under the tongue every 5 (five) minutes as needed for chest pain.   Yes Historical Provider, MD  Omega-3 Fatty Acids (OMEGA 3 PO) Take 1,000 mg by mouth daily.    Yes Historical Provider, MD  Saw Palmetto 450  MG CAPS Take 1 capsule by mouth daily.   Yes Historical Provider, MD  spironolactone (ALDACTONE) 25 MG tablet Take 25 mg by mouth daily.   Yes Historical Provider, MD  tamsulosin (FLOMAX) 0.4 MG CAPS capsule Take 0.4 mg by mouth daily.   Yes Historical Provider, MD    Family History Family History  Problem Relation Age of Onset  . Coronary artery disease Father   . Coronary artery disease Brother   . Asthma      Social History Social History  Substance Use Topics  . Smoking status: Former Smoker    Types: Cigarettes    Quit date: 01/13/1980  . Smokeless tobacco: Never Used  . Alcohol use No     Allergies   Review of patient's allergies indicates no known allergies.   Review of Systems Review of Systems  Constitutional: Negative for chills and fever.  HENT: Negative for congestion and sore throat.   Eyes: Negative.   Respiratory: Negative for chest tightness and shortness of breath.   Cardiovascular: Negative for chest pain and leg swelling.  Gastrointestinal: Negative for abdominal pain, nausea and vomiting.  Genitourinary: Negative.   Musculoskeletal: Negative for arthralgias, joint swelling and neck pain.  Skin: Negative.  Negative for rash and wound.  Neurological: Positive for weakness. Negative for dizziness, light-headedness, numbness and headaches.  Psychiatric/Behavioral: Negative.      Physical Exam Updated Vital Signs BP 129/83   Pulse 61   Temp 97.8 F (36.6 C) (Oral)   Resp 13   Ht 5\' 7"  (1.702 m)   Wt 88.5 kg   SpO2 97%   BMI 30.54 kg/m   Physical Exam  Constitutional: He is oriented to person, place, and time. He appears well-developed and well-nourished.  HENT:  Head: Normocephalic and atraumatic.  Eyes: Conjunctivae are normal.  Neck: Normal range of motion.  Cardiovascular: Normal rate, regular rhythm, normal heart sounds and intact distal pulses.   Pulmonary/Chest: Effort normal and breath sounds normal. He has no wheezes.  Abdominal:  Soft. Bowel sounds are normal. There is no tenderness. There is no guarding.  Musculoskeletal: Normal range of motion. He exhibits no tenderness.  Neurological: He is alert and oriented to person, place, and time. He has normal strength. No cranial nerve deficit or sensory deficit.  Equal grip strength. Down going toes.  No pronator drift.  Pt had great effort in sitting from supine position.  position secondary to weakness but is able to flex/ext knees, ankles and upper extremities with little effort.    Skin: Skin is warm and dry.  Psychiatric: He has a normal mood and affect.  Nursing note and vitals reviewed.    ED Treatments / Results  Labs (all labs ordered are listed, but only abnormal results are displayed) Labs Reviewed  ETHANOL - Abnormal; Notable for the following:  Result Value   Alcohol, Ethyl (B) 9 (*)    All other components within normal limits  CBC - Abnormal; Notable for the following:    Hemoglobin 12.6 (*)    HCT 38.5 (*)    All other components within normal limits  COMPREHENSIVE METABOLIC PANEL - Abnormal; Notable for the following:    Glucose, Bld 211 (*)    Alkaline Phosphatase 36 (*)    All other components within normal limits  CBG MONITORING, ED - Abnormal; Notable for the following:    Glucose-Capillary 206 (*)    All other components within normal limits  I-STAT CHEM 8, ED - Abnormal; Notable for the following:    Glucose, Bld 207 (*)    All other components within normal limits  CBG MONITORING, ED - Abnormal; Notable for the following:    Glucose-Capillary 102 (*)    All other components within normal limits  PROTIME-INR  APTT  DIFFERENTIAL  URINE RAPID DRUG SCREEN, HOSP PERFORMED  URINALYSIS, ROUTINE W REFLEX MICROSCOPIC (NOT AT [facility])  I-STAT TROPOININ, ED    EKG  EKG Interpretation  Date/Time:  Friday September 13 2015 08:21:42 EDT Ventricular Rate:  70 PR Interval:    QRS Duration: 170 QT Interval:  429 QTC Calculation: 463 R  Axis:   -46 Text Interpretation:  Sinus rhythm Prolonged PR interval RBBB and LAFB No significant change since last tracing Confirmed by ZACKOWSKI  MD, SCOTT ([id number]) on 09/13/2015 9:04:54 AM       Radiology Ct Head Wo Contrast  Result Date: 09/13/2015 CLINICAL DATA:  Unsteady gait with speech difficulty and inability to ambulate today. Initial encounter. EXAM: CT HEAD WITHOUT CONTRAST TECHNIQUE: Contiguous axial images were obtained from the base of the skull through the vertex without intravenous contrast. COMPARISON:  Head CT and MRI 11/29/2014 FINDINGS: Brain: There is no evidence of acute intracranial hemorrhage, mass lesion, brain edema or extra-axial fluid collection. The ventricles and subarachnoid spaces are appropriately sized for age. There is no CT evidence of acute cortical infarction. Extensive chronic small vessel ischemic changes in the periventricular white matter and deep gray matter bilaterally are unchanged. Vascular: Intracranial vascular calcifications are noted. Skull: Negative for fracture or focal lesion. Sinuses/Orbits: The visualized paranasal sinuses and mastoid air cells are clear. Other: None. IMPRESSION: 1. No acute intracranial findings seen. 2. Stable chronic small vessel ischemic changes. Electronically Signed   By: Richardean Sale M.D.   On: 09/13/2015 10:21   Mr Brain Wo Contrast  Result Date: 09/13/2015 CLINICAL DATA:  Extremity weakness. Rule out TIA. Diabetes and hypertension. EXAM: MRI HEAD WITHOUT CONTRAST TECHNIQUE: Multiplanar, multiecho pulse sequences of the brain and surrounding structures were obtained without intravenous contrast. COMPARISON:  CT 09/13/2015.  MRI 11/29/2014 FINDINGS: Acute infarct right lateral pons measuring approximately 1 cm. 3 mm acute infarct in the left medial frontal white matter. Extensive periventricular and deep white matter hyperintensity bilaterally compatible with chronic microvascular ischemia. Chronic lacunar infarction left  thalamus. Chronic lacunar infarction head of caudate on the left and left putamen. These findings are similar to 2016. Mild chronic ischemia in the pons. Small chronic infarct left cerebellum. Negative for hemorrhage or fluid collection Negative for mass or edema.  No shift of the midline structures Pituitary normal in size. Mucosal edema paranasal sinuses.  No orbital lesion. IMPRESSION: Acute infarct right lateral thalamus Small acute infarct left medial frontal white matter Extensive chronic microvascular ischemic changes stable since 2016. Electronically Signed   By: Franchot Gallo M.D.  On: 09/13/2015 11:38    Procedures Procedures (including critical care time)  Medications Ordered in ED Medications  acetaminophen (TYLENOL) tablet 650 mg (650 mg Oral Refused 09/13/15 1130)     Initial Impression / Assessment and Plan / ED Course  I have reviewed the triage vital signs and the nursing notes.  Pertinent labs & imaging results that were available during my care of the patient were reviewed by me and considered in my medical decision making (see chart for details).  Clinical Course    Discussed with Dr.Chiu who will see pt in ed and plan admission. Discussed results with patient and wife who understand results and plan.  Final Clinical Impressions(s) / ED Diagnoses   Final diagnoses:  Cerebral infarction due to unspecified mechanism    New Prescriptions New Prescriptions   No medications on file     Evalee Jefferson, PA-C 09/13/15 1337

## 2015-09-13 NOTE — ED Notes (Signed)
MD at bedside. 

## 2015-09-14 LAB — COMPREHENSIVE METABOLIC PANEL
ALT: 31 U/L (ref 17–63)
ANION GAP: 6 (ref 5–15)
AST: 22 U/L (ref 15–41)
Albumin: 4 g/dL (ref 3.5–5.0)
Alkaline Phosphatase: 31 U/L — ABNORMAL LOW (ref 38–126)
BILIRUBIN TOTAL: 0.7 mg/dL (ref 0.3–1.2)
BUN: 17 mg/dL (ref 6–20)
CO2: 24 mmol/L (ref 22–32)
Calcium: 9 mg/dL (ref 8.9–10.3)
Chloride: 107 mmol/L (ref 101–111)
Creatinine, Ser: 1 mg/dL (ref 0.61–1.24)
Glucose, Bld: 133 mg/dL — ABNORMAL HIGH (ref 65–99)
POTASSIUM: 3.8 mmol/L (ref 3.5–5.1)
Sodium: 137 mmol/L (ref 135–145)
TOTAL PROTEIN: 6.5 g/dL (ref 6.5–8.1)

## 2015-09-14 LAB — CBC
HEMATOCRIT: 37.8 % — AB (ref 39.0–52.0)
HEMOGLOBIN: 12.4 g/dL — AB (ref 13.0–17.0)
MCH: 26.5 pg (ref 26.0–34.0)
MCHC: 32.8 g/dL (ref 30.0–36.0)
MCV: 80.8 fL (ref 78.0–100.0)
Platelets: 274 10*3/uL (ref 150–400)
RBC: 4.68 MIL/uL (ref 4.22–5.81)
RDW: 14.2 % (ref 11.5–15.5)
WBC: 3.8 10*3/uL — AB (ref 4.0–10.5)

## 2015-09-14 LAB — LIPID PANEL
CHOL/HDL RATIO: 6.1 ratio
CHOLESTEROL: 140 mg/dL (ref 0–200)
HDL: 23 mg/dL — ABNORMAL LOW (ref >40)
LDL CALC: 52 mg/dL (ref 0–99)
TRIGLYCERIDES: 326 mg/dL — AB (ref <150)
VLDL: 65 mg/dL — AB (ref 0–40)

## 2015-09-14 LAB — GLUCOSE, CAPILLARY
GLUCOSE-CAPILLARY: 166 mg/dL — AB (ref 65–99)
Glucose-Capillary: 151 mg/dL — ABNORMAL HIGH (ref 65–99)

## 2015-09-14 MED ORDER — ATORVASTATIN CALCIUM 10 MG PO TABS: 10.0000 mg | ORAL_TABLET | Freq: Every day | ORAL | 0 refills | Status: DC

## 2015-09-14 NOTE — Discharge Summary (Signed)
Physician Discharge Summary  Steve Ramirez [id number] DOB: 04/23/1935 DOA: 09/13/2015  PCP: Purvis Kilts, MD  Admit date: 09/13/2015 Discharge date: 09/14/2015  Admitted From: Home Disposition:  Home  Recommendations for Outpatient Follow-up:  1. Follow up with PCP in 1-2 weeks 2. Follow up for setting up outpatient event monitor with cardiology 3. Please monitor patient's blood pressure and adjust blood pressure medications as needed  Home Health:HH PT/OT/SLP  Equipment/Devices:walker    Discharge Condition:Stable CODE STATUS:DNR Diet recommendation: Heart healty, diabetic   Brief/Interim Summary: 80 y.o. 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 Patient states his coworkers thought patient looked "bad" and patient subsequently went home afterwards. At home, patient reports continued lower extremity weakness with resultant difficulty ambulating. Symptoms did not change and persisted all night. The following morning at around 6 AM, patient reported sudden change in speech and a new facial droop. Patient later presented for further workup  1. Acute CVA 1. Acute right lateral pontine as well as small acute left medial frontal infarcts noted on MRI 2. Carotid Dopplers without significant stenosis bilaterally 3. 2-D echocardiogram was done on 09/13/2015 with findings of EF of 40-45% with hypokinesis of the inferior/inferior septal walls. 4. A1c was ordered, still pending at the time of this dictation 5. Lipid panel demonstrated an LDL of 52, patient was started on low-dose statin secondary to presenting acute CVA 6. Continued with neuro checks while on floor 7. Neurology was consulted. There are concerns for a cardioembolic source. Recommendations are for a 30 day  outpatient event monitor. Will request cardiology to schedule an event monitor with patient 8. Also, neurology recommends continuing dual antiplatelet regimen (aspirin and Plavix) for 30 days, after which patient should continue on only one antiplatelet given concerns of increased bleeding risk. 2. HTN 1. Blood pressure remained stable 2. Patient was continued only on beta blocker for now, to allow for permissive hypertension per above 3. Would gradually resume blood pressure medications as an outpatient as tolerated 3. DM2 1. A1c obtained, still pending at the time of this dictation 2. Continued patient on metformin per home regimen 3. Continued sliding-scale regimen as needed 4. HLD 1. Patient noted to have LDL of 52 2. Patient on fenofibrate as well as omega-3 fatty acids prior to admission 3. Started patient empirically on a low-dose statin 5. CAD 1. Chest pain free this admission 2. Cont ASA and plavix per above  Discharge Diagnoses:  Principal Problem:   Acute CVA (cerebrovascular accident) ([place]) Active Problems:   Essential hypertension, benign   Type 2 diabetes mellitus ([place])   Hyperlipidemia   History of TIAs   Coronary atherosclerosis of native coronary artery  Discharge Instructions    Medication List    STOP taking these medications   amLODipine 10 MG tablet Commonly known as:  NORVASC   spironolactone 25 MG tablet Commonly known as:  ALDACTONE     TAKE these medications   aspirin 325 MG tablet Take 1 tablet (325 mg total) by mouth daily.   atorvastatin 10 MG tablet Commonly known as:  LIPITOR Take 1 tablet (10 mg total) by mouth daily at 6 PM.   cetirizine 10 MG tablet Commonly known as:  ZYRTEC Take 10 mg by mouth daily.   clopidogrel 75 MG tablet Commonly known as:  PLAVIX Take 1 tablet (75 mg total) by mouth daily.   fenofibrate 145 MG tablet Commonly known as:  TRICOR Take 145 mg by mouth daily.   lisinopril-hydrochlorothiazide 20-12.5 MG  tablet Commonly known as:  PRINZIDE,ZESTORETIC Take 1 tablet by mouth 2 (two) times daily.   metFORMIN 500 MG tablet Commonly known as:  GLUCOPHAGE Take 500 mg by mouth 2 (two) times daily.   metoprolol succinate 50 MG 24 hr tablet Commonly known as:  TOPROL-XL Take 50 mg by mouth daily. Take with or immediately following a meal.   multivitamin tablet Take 1 tablet by mouth daily.   nitroGLYCERIN 0.4 MG SL tablet Commonly known as:  NITROSTAT Place 0.4 mg under the tongue every 5 (five) minutes as needed for chest pain.   OMEGA 3 PO Take 1,000 mg by mouth daily.   Saw Palmetto 450 MG Caps Take 1 capsule by mouth daily.   tamsulosin 0.4 MG Caps capsule Commonly known as:  FLOMAX Take 0.4 mg by mouth daily.       No Known Allergies  Consultations:  Neurology  Procedures/Studies: Ct Head Wo Contrast  Result Date: 09/13/2015 CLINICAL DATA:  Unsteady gait with speech difficulty and inability to ambulate today. Initial encounter. EXAM: CT HEAD WITHOUT CONTRAST TECHNIQUE: Contiguous axial images were obtained from the base of the skull through the vertex without intravenous contrast. COMPARISON:  Head CT and MRI 11/29/2014 FINDINGS: Brain: There is no evidence of acute intracranial hemorrhage, mass lesion, brain edema or extra-axial fluid collection. The ventricles and subarachnoid spaces are appropriately sized for age. There is no CT evidence of acute cortical infarction. Extensive chronic small vessel ischemic changes in the periventricular white matter and deep gray matter bilaterally are unchanged. Vascular: Intracranial vascular calcifications are noted. Skull: Negative for fracture or focal lesion. Sinuses/Orbits: The visualized paranasal sinuses and mastoid air cells are clear. Other: None. IMPRESSION: 1. No acute intracranial findings seen. 2. Stable chronic small vessel ischemic changes. Electronically Signed   By: Richardean Sale M.D.   On: 09/13/2015 10:21   Mr Brain Wo  Contrast  Addendum Date: 09/13/2015   ADDENDUM REPORT: 09/13/2015 15:37 ADDENDUM: Correction to the impression of the report. The first line should read "Acute infarct right lateral pons" , not thalamus Electronically Signed   By: Franchot Gallo M.D.   On: 09/13/2015 15:37   Result Date: 09/13/2015 CLINICAL DATA:  Extremity weakness. Rule out TIA. Diabetes and hypertension. EXAM: MRI HEAD WITHOUT CONTRAST TECHNIQUE: Multiplanar, multiecho pulse sequences of the brain and surrounding structures were obtained without intravenous contrast. COMPARISON:  CT 09/13/2015.  MRI 11/29/2014 FINDINGS: Acute infarct right lateral pons measuring approximately 1 cm. 3 mm acute infarct in the left medial frontal white matter. Extensive periventricular and deep white matter hyperintensity bilaterally compatible with chronic microvascular ischemia. Chronic lacunar infarction left thalamus. Chronic lacunar infarction head of caudate on the left and left putamen. These findings are similar to 2016. Mild chronic ischemia in the pons. Small chronic infarct left cerebellum. Negative for hemorrhage or fluid collection Negative for mass or edema.  No shift of the midline structures Pituitary normal in size. Mucosal edema paranasal sinuses.  No orbital lesion. IMPRESSION: Acute infarct right lateral thalamus Small acute infarct left medial frontal white matter Extensive chronic microvascular ischemic changes stable since 2016. Electronically Signed: By: Franchot Gallo M.D. On: 09/13/2015 11:38   US Carotid Bilateral (at [facility] Only)  Result Date: 09/13/2015 CLINICAL DATA:  CVA EXAM: BILATERAL CAROTID DUPLEX ULTRASOUND  TECHNIQUE: Pearline Cables scale imaging, color Doppler and duplex ultrasound were performed of bilateral carotid and vertebral arteries in the neck. COMPARISON:  None. FINDINGS: Criteria: Quantification of carotid stenosis is based on velocity parameters that correlate the residual internal carotid diameter with NASCET-based  stenosis levels, using the diameter of the distal internal carotid lumen as the denominator for stenosis measurement. The following velocity measurements were obtained: RIGHT ICA:  83 cm/sec CCA:  [id number] cm/sec SYSTOLIC ICA/CCA RATIO:  0.8 DIASTOLIC ICA/CCA RATIO:  1.0 ECA:  93 cm/sec LEFT ICA:  76 cm/sec CCA:  0000000 cm/sec SYSTOLIC ICA/CCA RATIO:  0.7 DIASTOLIC ICA/CCA RATIO:  1.0 ECA:  117 cm/sec RIGHT CAROTID ARTERY: Smooth plaque along the common carotid artery. Moderate mixed smooth plaque in the bulb. Low resistance internal carotid Doppler pattern. RIGHT VERTEBRAL ARTERY:  Antegrade. LEFT CAROTID ARTERY: Moderate mixed plaque in the bulb. Low resistance internal carotid Doppler pattern. LEFT VERTEBRAL ARTERY:  Antegrade. IMPRESSION: Less than 50% stenosis in the right and left internal carotid arteries. Electronically Signed   By: Marybelle Killings M.D.   On: 09/13/2015 15:34   Mr Jodene Nam Head/brain [id number] Cm  Result Date: 09/13/2015 CLINICAL DATA:  80 year old male with acute right brainstem infarct at the pons. Initial encounter. EXAM: MRA HEAD WITHOUT CONTRAST TECHNIQUE: Angiographic images of the Circle of Willis were obtained using MRA technique without intravenous contrast. COMPARISON:  Brain MRI 1117 hours today. Intracranial MRA 11/29/2014. FINDINGS: Stable antegrade flow in the posterior circulation. Dominant distal left vertebral. Stable bilateral PICA flow. Stable basilar artery tortuosity without significant stenosis. SCA and PCA origins are stable and within normal limits. Tortuous left P1 segment. Posterior communicating arteries are diminutive or absent. Stable bilateral PCA branches. Stable antegrade flow in both ICA siphons. Normal ophthalmic artery origins. Mild to moderate left siphon stenosis at the anterior genu is stable. Patent carotid termini. Stenotic or non dominant left ACA A1 segment is stable. Otherwise normal MCA and ACA origins. Diminutive or absent anterior communicating artery. Stable and  negative visualized ACA branches. Stable visualized bilateral MCA branches with mild irregularity. IMPRESSION: 1. Stable intracranial MRA findings. 2. Intracranial atherosclerosis with no significant posterior circulation stenosis. Up to moderate stenosis of the left ICA siphon. Electronically Signed   By: Genevie Ann M.D.   On: 09/13/2015 14:46     Subjective: Eager to go home.  Discharge Exam: Vitals:   09/14/15 0719 09/14/15 0940  BP: (!) 141/58 (!) 125/58  Pulse: (!) 59 72  Resp: 15   Temp: 98 F (36.7 C)    Vitals:   09/14/15 0119 09/14/15 0319 09/14/15 0719 09/14/15 0940  BP: 134/71 (!) 154/71 (!) 141/58 (!) 125/58  Pulse: (!) 59 70 (!) 59 72  Resp: 17 14 15    Temp: 97.3 F (36.3 C) 97.3 F (36.3 C) 98 F (36.7 C)   TempSrc: Oral Oral Oral   SpO2: 99% 99% 98% 98%  Weight:      Height:        General: Pt is alert, awake, not in acute distress, Lying in bed Cardiovascular: RRR, S1/S2 +, no rubs, no gallops Respiratory: CTA bilaterally, no wheezing, no rhonchi Abdominal: Soft, NT, ND, bowel sounds + Extremities: no edema, no cyanosis   The results of significant diagnostics from this hospitalization (including imaging, microbiology, ancillary and laboratory) are listed below for reference.     Microbiology: No results found for this or any previous visit (from the past 240 hour(s)).   Labs: BNP (last 3 results) No  results for input(s): BNP in the last 8760 hours. Basic Metabolic Panel:  Recent Labs Lab 09/13/15 0914 09/13/15 0916 09/14/15 0606  NA 138 140 137  K 4.0 4.0 3.8  CL 104 102 107  CO2 25  --  24  GLUCOSE 211* 207* 133*  BUN 17 16 17   CREATININE 1.02 1.10 1.00  CALCIUM 9.5  --  9.0   Liver Function Tests:  Recent Labs Lab 09/13/15 0914 09/14/15 0606  AST 22 22  ALT 34 31  ALKPHOS 36* 31*  BILITOT 0.4 0.7  PROT 7.2 6.5  ALBUMIN 4.4 4.0   No results for input(s): LIPASE, AMYLASE in the last 168 hours. No results for input(s): AMMONIA  in the last 168 hours. CBC:  Recent Labs Lab 09/13/15 0914 09/13/15 0916 09/14/15 0606  WBC 4.4  --  3.8*  NEUTROABS 2.5  --   --   HGB 12.6* 13.3 12.4*  HCT 38.5* 39.0 37.8*  MCV 80.9  --  80.8  PLT 285  --  274   Cardiac Enzymes: No results for input(s): CKTOTAL, CKMB, CKMBINDEX, TROPONINI in the last 168 hours. BNP: Invalid input(s): POCBNP CBG:  Recent Labs Lab 09/13/15 0829 09/13/15 1306 09/13/15 1618 09/13/15 2114 09/14/15 0742  GLUCAP 206* 102* 96 116* 151*   D-Dimer No results for input(s): DDIMER in the last 72 hours. Hgb A1c No results for input(s): HGBA1C in the last 72 hours. Lipid Profile  Recent Labs  09/14/15 0606  CHOL 140  HDL 23*  LDLCALC 52  TRIG 326*  CHOLHDL 6.1   Thyroid function studies No results for input(s): TSH, T4TOTAL, T3FREE, THYROIDAB in the last 72 hours.  Invalid input(s): FREET3 Anemia work up No results for input(s): VITAMINB12, FOLATE, FERRITIN, TIBC, IRON, RETICCTPCT in the last 72 hours. Urinalysis    Component Value Date/Time   COLORURINE YELLOW 09/13/2015 0906   APPEARANCEUR CLEAR 09/13/2015 0906   LABSPEC 1.010 09/13/2015 0906   PHURINE 6.5 09/13/2015 0906   GLUCOSEU NEGATIVE 09/13/2015 0906   HGBUR NEGATIVE 09/13/2015 0906   BILIRUBINUR NEGATIVE 09/13/2015 0906   KETONESUR NEGATIVE 09/13/2015 0906   PROTEINUR NEGATIVE 09/13/2015 0906   UROBILINOGEN 0.2 04/15/2010 1425   NITRITE NEGATIVE 09/13/2015 0906   LEUKOCYTESUR NEGATIVE 09/13/2015 0906   Sepsis Labs Invalid input(s): PROCALCITONIN,  WBC,  LACTICIDVEN Microbiology No results found for this or any previous visit (from the past 240 hour(s)).   SIGNED:   Donne Hazel, MD  [facility] 09/14/2015, 11:10 AM  If 7PM-7AM, please contact night-coverage [email or web address] Password [email or web address]

## 2015-09-14 NOTE — Care Management Note (Signed)
Case Management Note  Patient Details  Name: Steve Ramirez MRN: [id number] Date of Birth: 1935/11/25  Subjective/Objective:                    Action/Plan: Patient discharged home with home health.    Expected Discharge Date:   09/14/15               Expected Discharge Plan:  May  In-House Referral:  NA  Discharge planning Services     Post Acute Care Choice:  Home Health Choice offered to:     DME Arranged:  [facility] DME Agency:  [place] Arranged:  PT, OT, Speech Therapy [place] Agency:  [place]  Status of Service:  Completed, signed off  If discussed at [place] of Stay Meetings, dates discussed:    Additional Comments:  Briant Sites, RN 09/14/2015, 3:37 PM

## 2015-09-14 NOTE — Evaluation (Signed)
Physical Therapy Evaluation Patient Details Name: Steve Ramirez MRN: [id number] DOB: 1935-04-21 Today's Date: 09/14/2015   History of Present Illness  80 year old white male who presents with acute onset of marked gait instability he late yesterday while he was at a local game. Symptoms progressively got worse and the patient was taken to the hospital this morning by his wife. He endorses having some mild headaches and dizziness described mostly as disequilibrium. He denies any slurring of his speech. He denies any chest pain or palpitation. No shortness of breath, GI or GU symptoms are reported. The patient has been on dual antiplatelet agents over the last year for unclear reasons. He tells her that he has had a heart attack in the past. No stenting has been placed however. The review of systems otherwise negative.  Dx: Simultaneous bilateral small infarcts worrisome for cardioembolic phenomena. Risk factors diabetes, age, previous ischemic events and hypertension.  MRI: Acute infarct right lateral pons measuring approximately 1 cm, and 3 mm acute infarct in the left medial frontal white matter.  Chronic microvascular ischemia. Chronic lacunar infarction left thalamus. Chronic lacunar infarction head of caudate on the left and left putamen. These findings are similar to 2016. Mild chronic ischemia in the pons. Small chronic infarct left cerebellum.  PMH: Coronary atherosclerosis, of native coronary artery, essential HTN, GIB - colon diverticular bleed, melanoma, TIA's, DM2, MI?.  Clinical Impression  Pt received in bed, wife and dtr present, and pt is agreeable to PT evaluation.  Pt states that he was independent with all functional mobility prior to this admission.  He is retired from being a Research officer, trade union.  During today's PT evaluation, he demonstrates decreased strength, most notable in his L UE, as well as L LE.  He also demonstrates ataxia in both his L UE and L LE.  He was able to  ambulate 40ft with RW and Min guard.  He is very guarded and cautious with his mobility.  He has had 2 falls in the past 6 months, and combined with this CVA, and decreased gait speed, he is a significant risk for falling.  He is recommended to d/c home with HHPT, and RW.      Follow Up Recommendations Home health PT    Equipment Recommendations  Rolling walker with 5" wheels    Recommendations for Other Services       Precautions / Restrictions Precautions Precautions: Fall Precaution Comments: Wife reports that he had a fall at the beach after sitting in the beach chair 3 weeks ago (Aug 14).  Pt states he fell flat on his face.  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  Restrictions Weight Bearing Restrictions: No      Mobility  Bed Mobility Overal bed mobility: Needs Assistance Bed Mobility: Supine to Sit     Supine to sit: Min assist     General bed mobility comments: Pt required increased time for motor planning due to ataxic movement of L UE & L LE.  PT allowed for increased time, but pt eventually required assistance to lift trunk up into seated position.    Transfers Overall transfer level: Needs assistance Equipment used: Rolling walker (2 wheeled) Transfers: Sit to/from [facility] Sit to Stand: Min guard Stand pivot transfers: Min guard          Ambulation/Gait Ambulation/Gait assistance: Min guard Ambulation Distance (Feet): 60 Feet  Assistive device: Rolling walker (2 wheeled) Gait Pattern/deviations: Step-to pattern;Decreased stride length;Decreased step length - left;Ataxic   Gait velocity interpretation: <1.8 ft/sec, indicative of risk for recurrent falls General Gait Details: Pt demonstrates very guarded gait, with decreased step length on the left.  Distance limited due to pt c/o fatigue.   Stairs            Wheelchair Mobility    Modified Rankin  (Stroke Patients Only) Modified Rankin (Stroke Patients Only) Pre-Morbid Rankin Score: No symptoms Modified Rankin: Moderately severe disability     Balance Overall balance assessment: Needs assistance Sitting-balance support: Bilateral upper extremity supported Sitting balance-Leahy Scale: Fair     Standing balance support: Bilateral upper extremity supported Standing balance-Leahy Scale: Fair                               Pertinent Vitals/Pain Pain Assessment: No/denies pain    Home Living   Living Arrangements: Spouse/significant other   Type of Home: House Home Access: Stairs to enter   [employer] of Steps: 5 steps from the garage into the kitchen,  Other entries have 3 steps or 1 step.   Home Layout: One level Home Equipment: Cane - single point;Wheelchair - manual;Crutches;Shower seat;Bedside commode (the walker they have was for his mother in law, who was 4'10")      Prior Function Level of Independence: Independent         Comments: enjoys going to the gym 3 days per week for circuit training. 30 min on elyptical     Hand Dominance   Dominant Hand: Right    Extremity/Trunk Assessment   Upper Extremity Assessment: LUE deficits/detail;RUE deficits/detail RUE Deficits / Details: Grossly 3/5 - pt also noted to have had a cervical fusion over 20 years ago with resultant tricep dysfunction per OPPT note in 2014     LUE Deficits / Details: Noted ataxia, as well as decreased fine motor.  Pt also demonstrates some generalized weakness at 3/5 with diminished grip strength when compared to the right.               Communication   Communication:  (speech is slurred, and word finding. )  Cognition Arousal/Alertness: Awake/alert Behavior During Therapy: WFL for tasks assessed/performed Overall Cognitive Status: Within Functional Limits for tasks assessed                      General Comments      Exercises         Assessment/Plan    PT Assessment Patient needs continued PT services  PT Diagnosis Difficulty walking;Abnormality of gait;Generalized weakness   PT Problem List Decreased strength;Decreased activity tolerance;Decreased balance;Decreased mobility;Decreased coordination;Decreased knowledge of use of DME;Decreased safety awareness;Cardiopulmonary status limiting activity  PT Treatment Interventions DME instruction;Gait training;Functional mobility training;Stair training;Balance training;Neuromuscular re-education;Therapeutic exercise;Therapeutic activities;Patient/family education   PT Goals (Current goals can be found in the Care Plan section) Acute Rehab PT Goals Patient Stated Goal: Pt wants to get back to the gym PT Goal Formulation: With patient/family Time For Goal Achievement: 09/21/15 Potential to Achieve Goals: Good    Frequency 7X/week   Barriers to discharge        Co-evaluation               End of Session Equipment Utilized During Treatment: Gait belt Activity Tolerance: Patient tolerated treatment well Patient left: in chair;with call bell/phone within reach;with family/visitor present  Nurse Communication: Mobility status    Functional Assessment Tool Used: [employer] "6-clicks"  Functional Limitation: Mobility: Walking and moving around Mobility: Walking and Moving Around Current Status [phone]): At least 40 percent but less than 60 percent impaired, limited or restricted Mobility: Walking and Moving Around Goal Status [phone]): At least 20 percent but less than 40 percent impaired, limited or restricted    Time: 0930-1010 PT Time Calculation (min) (ACUTE ONLY): 40 min   Charges:   PT Evaluation $PT Eval Low Complexity: 1 Procedure PT Treatments $Gait Training: 8-22 mins $Therapeutic Activity: 8-22 mins   PT G Codes:   PT G-Codes **NOT FOR INPATIENT CLASS** Functional Assessment Tool Used: [employer] "6-clicks"   Functional Limitation: Mobility: Walking and moving around Mobility: Walking and Moving Around Current Status [phone]): At least 40 percent but less than 60 percent impaired, limited or restricted Mobility: Walking and Moving Around Goal Status [phone]): At least 20 percent but less than 40 percent impaired, limited or restricted    Beth Juelz Claar, PT, DPT X: [phone]

## 2015-09-15 DIAGNOSIS — E119 Type 2 diabetes mellitus without complications: Secondary | ICD-10-CM | POA: Diagnosis not present

## 2015-09-15 DIAGNOSIS — I2510 Atherosclerotic heart disease of native coronary artery without angina pectoris: Secondary | ICD-10-CM | POA: Diagnosis not present

## 2015-09-15 DIAGNOSIS — Z7984 Long term (current) use of oral hypoglycemic drugs: Secondary | ICD-10-CM | POA: Diagnosis not present

## 2015-09-15 DIAGNOSIS — Z7982 Long term (current) use of aspirin: Secondary | ICD-10-CM | POA: Diagnosis not present

## 2015-09-15 DIAGNOSIS — I69351 Hemiplegia and hemiparesis following cerebral infarction affecting right dominant side: Secondary | ICD-10-CM | POA: Diagnosis not present

## 2015-09-15 DIAGNOSIS — I69354 Hemiplegia and hemiparesis following cerebral infarction affecting left non-dominant side: Secondary | ICD-10-CM | POA: Diagnosis not present

## 2015-09-15 DIAGNOSIS — I10 Essential (primary) hypertension: Secondary | ICD-10-CM | POA: Diagnosis not present

## 2015-09-15 DIAGNOSIS — Z85820 Personal history of malignant melanoma of skin: Secondary | ICD-10-CM | POA: Diagnosis not present

## 2015-09-15 LAB — HEMOGLOBIN A1C
Hgb A1c MFr Bld: 7.1 % — ABNORMAL HIGH (ref 4.8–5.6)
Mean Plasma Glucose: 157 mg/dL

## 2015-09-17 ENCOUNTER — Other Ambulatory Visit: Payer: Self-pay

## 2015-09-17 ENCOUNTER — Telehealth: Payer: Self-pay | Admitting: Orthopedic Surgery

## 2015-09-17 ENCOUNTER — Ambulatory Visit (HOSPITAL_COMMUNITY): Admission: RE | Admit: 2015-09-17 | Payer: Medicare HMO | Source: Ambulatory Visit

## 2015-09-17 ENCOUNTER — Telehealth: Payer: Self-pay

## 2015-09-17 DIAGNOSIS — I69351 Hemiplegia and hemiparesis following cerebral infarction affecting right dominant side: Secondary | ICD-10-CM | POA: Diagnosis not present

## 2015-09-17 DIAGNOSIS — I2510 Atherosclerotic heart disease of native coronary artery without angina pectoris: Secondary | ICD-10-CM | POA: Diagnosis not present

## 2015-09-17 DIAGNOSIS — E119 Type 2 diabetes mellitus without complications: Secondary | ICD-10-CM | POA: Diagnosis not present

## 2015-09-17 DIAGNOSIS — I4891 Unspecified atrial fibrillation: Secondary | ICD-10-CM

## 2015-09-17 DIAGNOSIS — I10 Essential (primary) hypertension: Secondary | ICD-10-CM | POA: Diagnosis not present

## 2015-09-17 DIAGNOSIS — Z7984 Long term (current) use of oral hypoglycemic drugs: Secondary | ICD-10-CM | POA: Diagnosis not present

## 2015-09-17 DIAGNOSIS — Z7982 Long term (current) use of aspirin: Secondary | ICD-10-CM | POA: Diagnosis not present

## 2015-09-17 DIAGNOSIS — I69354 Hemiplegia and hemiparesis following cerebral infarction affecting left non-dominant side: Secondary | ICD-10-CM | POA: Diagnosis not present

## 2015-09-17 DIAGNOSIS — Z85820 Personal history of malignant melanoma of skin: Secondary | ICD-10-CM | POA: Diagnosis not present

## 2015-09-17 NOTE — Telephone Encounter (Signed)
Patient's wife called to ask [place] to cancel the MRI. Decory has been in the hospital, because he had a stroke. She said that she didn't see a need for one since they did two already while he was in the hospital. She said he is home now and doing good.

## 2015-09-17 NOTE — Telephone Encounter (Signed)
MRI CANCELLED, FORWARDING TO DR HARRISON FOR HIS INFORMATION

## 2015-09-17 NOTE — Progress Notes (Signed)
Per KL,NP orders placed for event monitor

## 2015-09-17 NOTE — Telephone Encounter (Signed)
-----   Message from Lendon Colonel, NP sent at 09/17/2015 10:33 AM EDT ----- Will need follow up appointment after monitor duration completed. Thank you.  Curt Bears ----- Message ----- From: Donne Hazel, MD Sent: 09/14/2015  11:55 AM To: Lendon Colonel, NP  Hey there,  Mr. Mcmenamin presented with bilateral strokes, concerns for embolic source. Can you please arrange a 30 day event monitor for him to r/o underlying afib?  Thanks! -Levora Angel

## 2015-09-17 NOTE — Telephone Encounter (Signed)
Ordered 30 day event monitor per KL, NP by a dr Kalman Jewels, orders placed

## 2015-09-18 DIAGNOSIS — I69351 Hemiplegia and hemiparesis following cerebral infarction affecting right dominant side: Secondary | ICD-10-CM | POA: Diagnosis not present

## 2015-09-18 DIAGNOSIS — Z85820 Personal history of malignant melanoma of skin: Secondary | ICD-10-CM | POA: Diagnosis not present

## 2015-09-18 DIAGNOSIS — I69354 Hemiplegia and hemiparesis following cerebral infarction affecting left non-dominant side: Secondary | ICD-10-CM | POA: Diagnosis not present

## 2015-09-18 DIAGNOSIS — Z7982 Long term (current) use of aspirin: Secondary | ICD-10-CM | POA: Diagnosis not present

## 2015-09-18 DIAGNOSIS — I2510 Atherosclerotic heart disease of native coronary artery without angina pectoris: Secondary | ICD-10-CM | POA: Diagnosis not present

## 2015-09-18 DIAGNOSIS — I10 Essential (primary) hypertension: Secondary | ICD-10-CM | POA: Diagnosis not present

## 2015-09-18 DIAGNOSIS — Z7984 Long term (current) use of oral hypoglycemic drugs: Secondary | ICD-10-CM | POA: Diagnosis not present

## 2015-09-18 DIAGNOSIS — E119 Type 2 diabetes mellitus without complications: Secondary | ICD-10-CM | POA: Diagnosis not present

## 2015-09-19 DIAGNOSIS — I10 Essential (primary) hypertension: Secondary | ICD-10-CM | POA: Diagnosis not present

## 2015-09-19 DIAGNOSIS — I69351 Hemiplegia and hemiparesis following cerebral infarction affecting right dominant side: Secondary | ICD-10-CM | POA: Diagnosis not present

## 2015-09-19 DIAGNOSIS — I69354 Hemiplegia and hemiparesis following cerebral infarction affecting left non-dominant side: Secondary | ICD-10-CM | POA: Diagnosis not present

## 2015-09-19 DIAGNOSIS — I2510 Atherosclerotic heart disease of native coronary artery without angina pectoris: Secondary | ICD-10-CM | POA: Diagnosis not present

## 2015-09-19 DIAGNOSIS — E119 Type 2 diabetes mellitus without complications: Secondary | ICD-10-CM | POA: Diagnosis not present

## 2015-09-19 DIAGNOSIS — Z7982 Long term (current) use of aspirin: Secondary | ICD-10-CM | POA: Diagnosis not present

## 2015-09-19 DIAGNOSIS — Z85820 Personal history of malignant melanoma of skin: Secondary | ICD-10-CM | POA: Diagnosis not present

## 2015-09-19 DIAGNOSIS — Z7984 Long term (current) use of oral hypoglycemic drugs: Secondary | ICD-10-CM | POA: Diagnosis not present

## 2015-09-20 ENCOUNTER — Ambulatory Visit: Payer: Medicare HMO | Admitting: Orthopedic Surgery

## 2015-09-20 DIAGNOSIS — I10 Essential (primary) hypertension: Secondary | ICD-10-CM | POA: Diagnosis not present

## 2015-09-20 DIAGNOSIS — I2510 Atherosclerotic heart disease of native coronary artery without angina pectoris: Secondary | ICD-10-CM | POA: Diagnosis not present

## 2015-09-20 DIAGNOSIS — Z85820 Personal history of malignant melanoma of skin: Secondary | ICD-10-CM | POA: Diagnosis not present

## 2015-09-20 DIAGNOSIS — Z7984 Long term (current) use of oral hypoglycemic drugs: Secondary | ICD-10-CM | POA: Diagnosis not present

## 2015-09-20 DIAGNOSIS — Z7982 Long term (current) use of aspirin: Secondary | ICD-10-CM | POA: Diagnosis not present

## 2015-09-20 DIAGNOSIS — I69354 Hemiplegia and hemiparesis following cerebral infarction affecting left non-dominant side: Secondary | ICD-10-CM | POA: Diagnosis not present

## 2015-09-20 DIAGNOSIS — E119 Type 2 diabetes mellitus without complications: Secondary | ICD-10-CM | POA: Diagnosis not present

## 2015-09-20 DIAGNOSIS — I69351 Hemiplegia and hemiparesis following cerebral infarction affecting right dominant side: Secondary | ICD-10-CM | POA: Diagnosis not present

## 2015-09-23 DIAGNOSIS — Z85820 Personal history of malignant melanoma of skin: Secondary | ICD-10-CM | POA: Diagnosis not present

## 2015-09-23 DIAGNOSIS — I10 Essential (primary) hypertension: Secondary | ICD-10-CM | POA: Diagnosis not present

## 2015-09-23 DIAGNOSIS — Z7982 Long term (current) use of aspirin: Secondary | ICD-10-CM | POA: Diagnosis not present

## 2015-09-23 DIAGNOSIS — I69354 Hemiplegia and hemiparesis following cerebral infarction affecting left non-dominant side: Secondary | ICD-10-CM | POA: Diagnosis not present

## 2015-09-23 DIAGNOSIS — I69351 Hemiplegia and hemiparesis following cerebral infarction affecting right dominant side: Secondary | ICD-10-CM | POA: Diagnosis not present

## 2015-09-23 DIAGNOSIS — E119 Type 2 diabetes mellitus without complications: Secondary | ICD-10-CM | POA: Diagnosis not present

## 2015-09-23 DIAGNOSIS — I2510 Atherosclerotic heart disease of native coronary artery without angina pectoris: Secondary | ICD-10-CM | POA: Diagnosis not present

## 2015-09-23 DIAGNOSIS — Z7984 Long term (current) use of oral hypoglycemic drugs: Secondary | ICD-10-CM | POA: Diagnosis not present

## 2015-09-24 DIAGNOSIS — I69351 Hemiplegia and hemiparesis following cerebral infarction affecting right dominant side: Secondary | ICD-10-CM | POA: Diagnosis not present

## 2015-09-24 DIAGNOSIS — I2510 Atherosclerotic heart disease of native coronary artery without angina pectoris: Secondary | ICD-10-CM | POA: Diagnosis not present

## 2015-09-24 DIAGNOSIS — I10 Essential (primary) hypertension: Secondary | ICD-10-CM | POA: Diagnosis not present

## 2015-09-24 DIAGNOSIS — Z7982 Long term (current) use of aspirin: Secondary | ICD-10-CM | POA: Diagnosis not present

## 2015-09-24 DIAGNOSIS — I4891 Unspecified atrial fibrillation: Secondary | ICD-10-CM | POA: Diagnosis not present

## 2015-09-24 DIAGNOSIS — E119 Type 2 diabetes mellitus without complications: Secondary | ICD-10-CM | POA: Diagnosis not present

## 2015-09-24 DIAGNOSIS — Z7984 Long term (current) use of oral hypoglycemic drugs: Secondary | ICD-10-CM | POA: Diagnosis not present

## 2015-09-24 DIAGNOSIS — Z85820 Personal history of malignant melanoma of skin: Secondary | ICD-10-CM | POA: Diagnosis not present

## 2015-09-24 DIAGNOSIS — I69354 Hemiplegia and hemiparesis following cerebral infarction affecting left non-dominant side: Secondary | ICD-10-CM | POA: Diagnosis not present

## 2015-09-25 ENCOUNTER — Ambulatory Visit (INDEPENDENT_AMBULATORY_CARE_PROVIDER_SITE_OTHER): Payer: Medicare HMO

## 2015-09-25 DIAGNOSIS — E6609 Other obesity due to excess calories: Secondary | ICD-10-CM | POA: Diagnosis not present

## 2015-09-25 DIAGNOSIS — I69354 Hemiplegia and hemiparesis following cerebral infarction affecting left non-dominant side: Secondary | ICD-10-CM | POA: Diagnosis not present

## 2015-09-25 DIAGNOSIS — Z85820 Personal history of malignant melanoma of skin: Secondary | ICD-10-CM | POA: Diagnosis not present

## 2015-09-25 DIAGNOSIS — Z7984 Long term (current) use of oral hypoglycemic drugs: Secondary | ICD-10-CM | POA: Diagnosis not present

## 2015-09-25 DIAGNOSIS — E119 Type 2 diabetes mellitus without complications: Secondary | ICD-10-CM | POA: Diagnosis not present

## 2015-09-25 DIAGNOSIS — I4891 Unspecified atrial fibrillation: Secondary | ICD-10-CM

## 2015-09-25 DIAGNOSIS — G2581 Restless legs syndrome: Secondary | ICD-10-CM | POA: Diagnosis not present

## 2015-09-25 DIAGNOSIS — Z7982 Long term (current) use of aspirin: Secondary | ICD-10-CM | POA: Diagnosis not present

## 2015-09-25 DIAGNOSIS — I2510 Atherosclerotic heart disease of native coronary artery without angina pectoris: Secondary | ICD-10-CM | POA: Diagnosis not present

## 2015-09-25 DIAGNOSIS — Z6830 Body mass index (BMI) 30.0-30.9, adult: Secondary | ICD-10-CM | POA: Diagnosis not present

## 2015-09-25 DIAGNOSIS — I639 Cerebral infarction, unspecified: Secondary | ICD-10-CM | POA: Diagnosis not present

## 2015-09-25 DIAGNOSIS — I10 Essential (primary) hypertension: Secondary | ICD-10-CM | POA: Diagnosis not present

## 2015-09-25 DIAGNOSIS — I69351 Hemiplegia and hemiparesis following cerebral infarction affecting right dominant side: Secondary | ICD-10-CM | POA: Diagnosis not present

## 2015-09-27 DIAGNOSIS — I2510 Atherosclerotic heart disease of native coronary artery without angina pectoris: Secondary | ICD-10-CM | POA: Diagnosis not present

## 2015-09-27 DIAGNOSIS — Z7984 Long term (current) use of oral hypoglycemic drugs: Secondary | ICD-10-CM | POA: Diagnosis not present

## 2015-09-27 DIAGNOSIS — I69351 Hemiplegia and hemiparesis following cerebral infarction affecting right dominant side: Secondary | ICD-10-CM | POA: Diagnosis not present

## 2015-09-27 DIAGNOSIS — Z85820 Personal history of malignant melanoma of skin: Secondary | ICD-10-CM | POA: Diagnosis not present

## 2015-09-27 DIAGNOSIS — I10 Essential (primary) hypertension: Secondary | ICD-10-CM | POA: Diagnosis not present

## 2015-09-27 DIAGNOSIS — I69354 Hemiplegia and hemiparesis following cerebral infarction affecting left non-dominant side: Secondary | ICD-10-CM | POA: Diagnosis not present

## 2015-09-27 DIAGNOSIS — Z7982 Long term (current) use of aspirin: Secondary | ICD-10-CM | POA: Diagnosis not present

## 2015-09-27 DIAGNOSIS — E119 Type 2 diabetes mellitus without complications: Secondary | ICD-10-CM | POA: Diagnosis not present

## 2015-09-30 DIAGNOSIS — I69351 Hemiplegia and hemiparesis following cerebral infarction affecting right dominant side: Secondary | ICD-10-CM | POA: Diagnosis not present

## 2015-09-30 DIAGNOSIS — E119 Type 2 diabetes mellitus without complications: Secondary | ICD-10-CM | POA: Diagnosis not present

## 2015-09-30 DIAGNOSIS — I2510 Atherosclerotic heart disease of native coronary artery without angina pectoris: Secondary | ICD-10-CM | POA: Diagnosis not present

## 2015-09-30 DIAGNOSIS — I69354 Hemiplegia and hemiparesis following cerebral infarction affecting left non-dominant side: Secondary | ICD-10-CM | POA: Diagnosis not present

## 2015-09-30 DIAGNOSIS — I10 Essential (primary) hypertension: Secondary | ICD-10-CM | POA: Diagnosis not present

## 2015-09-30 DIAGNOSIS — Z7984 Long term (current) use of oral hypoglycemic drugs: Secondary | ICD-10-CM | POA: Diagnosis not present

## 2015-09-30 DIAGNOSIS — Z85820 Personal history of malignant melanoma of skin: Secondary | ICD-10-CM | POA: Diagnosis not present

## 2015-09-30 DIAGNOSIS — Z7982 Long term (current) use of aspirin: Secondary | ICD-10-CM | POA: Diagnosis not present

## 2015-10-01 DIAGNOSIS — I2510 Atherosclerotic heart disease of native coronary artery without angina pectoris: Secondary | ICD-10-CM | POA: Diagnosis not present

## 2015-10-01 DIAGNOSIS — Z85820 Personal history of malignant melanoma of skin: Secondary | ICD-10-CM | POA: Diagnosis not present

## 2015-10-01 DIAGNOSIS — Z7984 Long term (current) use of oral hypoglycemic drugs: Secondary | ICD-10-CM | POA: Diagnosis not present

## 2015-10-01 DIAGNOSIS — I10 Essential (primary) hypertension: Secondary | ICD-10-CM | POA: Diagnosis not present

## 2015-10-01 DIAGNOSIS — E119 Type 2 diabetes mellitus without complications: Secondary | ICD-10-CM | POA: Diagnosis not present

## 2015-10-01 DIAGNOSIS — I69354 Hemiplegia and hemiparesis following cerebral infarction affecting left non-dominant side: Secondary | ICD-10-CM | POA: Diagnosis not present

## 2015-10-01 DIAGNOSIS — Z7982 Long term (current) use of aspirin: Secondary | ICD-10-CM | POA: Diagnosis not present

## 2015-10-01 DIAGNOSIS — I69351 Hemiplegia and hemiparesis following cerebral infarction affecting right dominant side: Secondary | ICD-10-CM | POA: Diagnosis not present

## 2015-10-02 DIAGNOSIS — Z7982 Long term (current) use of aspirin: Secondary | ICD-10-CM | POA: Diagnosis not present

## 2015-10-02 DIAGNOSIS — E119 Type 2 diabetes mellitus without complications: Secondary | ICD-10-CM | POA: Diagnosis not present

## 2015-10-02 DIAGNOSIS — Z7984 Long term (current) use of oral hypoglycemic drugs: Secondary | ICD-10-CM | POA: Diagnosis not present

## 2015-10-02 DIAGNOSIS — I69354 Hemiplegia and hemiparesis following cerebral infarction affecting left non-dominant side: Secondary | ICD-10-CM | POA: Diagnosis not present

## 2015-10-02 DIAGNOSIS — I10 Essential (primary) hypertension: Secondary | ICD-10-CM | POA: Diagnosis not present

## 2015-10-02 DIAGNOSIS — I69351 Hemiplegia and hemiparesis following cerebral infarction affecting right dominant side: Secondary | ICD-10-CM | POA: Diagnosis not present

## 2015-10-02 DIAGNOSIS — I2510 Atherosclerotic heart disease of native coronary artery without angina pectoris: Secondary | ICD-10-CM | POA: Diagnosis not present

## 2015-10-02 DIAGNOSIS — Z85820 Personal history of malignant melanoma of skin: Secondary | ICD-10-CM | POA: Diagnosis not present

## 2015-10-03 DIAGNOSIS — E119 Type 2 diabetes mellitus without complications: Secondary | ICD-10-CM | POA: Diagnosis not present

## 2015-10-03 DIAGNOSIS — Z7984 Long term (current) use of oral hypoglycemic drugs: Secondary | ICD-10-CM | POA: Diagnosis not present

## 2015-10-03 DIAGNOSIS — I10 Essential (primary) hypertension: Secondary | ICD-10-CM | POA: Diagnosis not present

## 2015-10-03 DIAGNOSIS — Z7982 Long term (current) use of aspirin: Secondary | ICD-10-CM | POA: Diagnosis not present

## 2015-10-03 DIAGNOSIS — Z85820 Personal history of malignant melanoma of skin: Secondary | ICD-10-CM | POA: Diagnosis not present

## 2015-10-03 DIAGNOSIS — I2510 Atherosclerotic heart disease of native coronary artery without angina pectoris: Secondary | ICD-10-CM | POA: Diagnosis not present

## 2015-10-03 DIAGNOSIS — I69354 Hemiplegia and hemiparesis following cerebral infarction affecting left non-dominant side: Secondary | ICD-10-CM | POA: Diagnosis not present

## 2015-10-03 DIAGNOSIS — I69351 Hemiplegia and hemiparesis following cerebral infarction affecting right dominant side: Secondary | ICD-10-CM | POA: Diagnosis not present

## 2015-10-15 DIAGNOSIS — I10 Essential (primary) hypertension: Secondary | ICD-10-CM | POA: Diagnosis not present

## 2015-10-15 DIAGNOSIS — R260 Ataxic gait: Secondary | ICD-10-CM | POA: Diagnosis not present

## 2015-10-15 DIAGNOSIS — G463 Brain stem stroke syndrome: Secondary | ICD-10-CM | POA: Diagnosis not present

## 2015-10-15 DIAGNOSIS — G2581 Restless legs syndrome: Secondary | ICD-10-CM | POA: Diagnosis not present

## 2015-10-15 DIAGNOSIS — M130 Polyarthritis, unspecified: Secondary | ICD-10-CM | POA: Diagnosis not present

## 2015-10-28 DIAGNOSIS — R69 Illness, unspecified: Secondary | ICD-10-CM | POA: Diagnosis not present

## 2015-11-18 DIAGNOSIS — I69893 Ataxia following other cerebrovascular disease: Secondary | ICD-10-CM | POA: Diagnosis not present

## 2016-01-15 DIAGNOSIS — Z1389 Encounter for screening for other disorder: Secondary | ICD-10-CM | POA: Diagnosis not present

## 2016-01-15 DIAGNOSIS — L299 Pruritus, unspecified: Secondary | ICD-10-CM | POA: Diagnosis not present

## 2016-01-15 DIAGNOSIS — N6331 Unspecified lump in axillary tail of the right breast: Secondary | ICD-10-CM | POA: Diagnosis not present

## 2016-01-15 DIAGNOSIS — Z6831 Body mass index (BMI) 31.0-31.9, adult: Secondary | ICD-10-CM | POA: Diagnosis not present

## 2016-01-22 ENCOUNTER — Other Ambulatory Visit (HOSPITAL_COMMUNITY): Payer: Self-pay | Admitting: Family Medicine

## 2016-01-22 DIAGNOSIS — N63 Unspecified lump in unspecified breast: Secondary | ICD-10-CM

## 2016-01-28 ENCOUNTER — Ambulatory Visit (HOSPITAL_COMMUNITY)
Admission: RE | Admit: 2016-01-28 | Discharge: 2016-01-28 | Disposition: A | Discharge status: Home / Self Care | Payer: Medicare HMO | Source: Ambulatory Visit | Attending: Family Medicine | Admitting: Family Medicine

## 2016-01-28 DIAGNOSIS — N6310 Unspecified lump in the right breast, unspecified quadrant: Secondary | ICD-10-CM | POA: Diagnosis not present

## 2016-01-28 DIAGNOSIS — N63 Unspecified lump in unspecified breast: Secondary | ICD-10-CM

## 2016-01-28 DIAGNOSIS — N62 Hypertrophy of breast: Secondary | ICD-10-CM | POA: Insufficient documentation

## 2016-01-28 DIAGNOSIS — R928 Other abnormal and inconclusive findings on diagnostic imaging of breast: Secondary | ICD-10-CM | POA: Diagnosis not present

## 2016-04-20 DIAGNOSIS — Z6831 Body mass index (BMI) 31.0-31.9, adult: Secondary | ICD-10-CM | POA: Diagnosis not present

## 2016-04-20 DIAGNOSIS — E782 Mixed hyperlipidemia: Secondary | ICD-10-CM | POA: Diagnosis not present

## 2016-04-20 DIAGNOSIS — E119 Type 2 diabetes mellitus without complications: Secondary | ICD-10-CM | POA: Diagnosis not present

## 2016-04-20 DIAGNOSIS — I10 Essential (primary) hypertension: Secondary | ICD-10-CM | POA: Diagnosis not present

## 2016-04-20 DIAGNOSIS — Z1389 Encounter for screening for other disorder: Secondary | ICD-10-CM | POA: Diagnosis not present

## 2016-04-20 DIAGNOSIS — E6609 Other obesity due to excess calories: Secondary | ICD-10-CM | POA: Diagnosis not present

## 2016-04-20 DIAGNOSIS — Z8673 Personal history of transient ischemic attack (TIA), and cerebral infarction without residual deficits: Secondary | ICD-10-CM | POA: Diagnosis not present

## 2016-04-20 DIAGNOSIS — I2510 Atherosclerotic heart disease of native coronary artery without angina pectoris: Secondary | ICD-10-CM | POA: Diagnosis not present

## 2016-04-21 DIAGNOSIS — R69 Illness, unspecified: Secondary | ICD-10-CM | POA: Diagnosis not present

## 2016-06-17 DIAGNOSIS — I10 Essential (primary) hypertension: Secondary | ICD-10-CM | POA: Diagnosis not present

## 2016-06-17 DIAGNOSIS — G459 Transient cerebral ischemic attack, unspecified: Secondary | ICD-10-CM | POA: Diagnosis not present

## 2016-06-17 DIAGNOSIS — R0602 Shortness of breath: Secondary | ICD-10-CM | POA: Diagnosis not present

## 2016-06-17 DIAGNOSIS — I2510 Atherosclerotic heart disease of native coronary artery without angina pectoris: Secondary | ICD-10-CM | POA: Diagnosis not present

## 2016-06-17 DIAGNOSIS — I639 Cerebral infarction, unspecified: Secondary | ICD-10-CM | POA: Diagnosis not present

## 2016-06-17 DIAGNOSIS — I491 Atrial premature depolarization: Secondary | ICD-10-CM | POA: Diagnosis not present

## 2016-06-17 DIAGNOSIS — I2583 Coronary atherosclerosis due to lipid rich plaque: Secondary | ICD-10-CM | POA: Diagnosis not present

## 2016-06-17 DIAGNOSIS — I493 Ventricular premature depolarization: Secondary | ICD-10-CM | POA: Diagnosis not present

## 2016-06-30 DIAGNOSIS — E039 Hypothyroidism, unspecified: Secondary | ICD-10-CM | POA: Diagnosis not present

## 2016-07-21 DIAGNOSIS — I2510 Atherosclerotic heart disease of native coronary artery without angina pectoris: Secondary | ICD-10-CM | POA: Diagnosis not present

## 2016-07-21 DIAGNOSIS — R0602 Shortness of breath: Secondary | ICD-10-CM | POA: Diagnosis not present

## 2016-08-04 DIAGNOSIS — I639 Cerebral infarction, unspecified: Secondary | ICD-10-CM | POA: Diagnosis not present

## 2016-08-04 DIAGNOSIS — Z0001 Encounter for general adult medical examination with abnormal findings: Secondary | ICD-10-CM | POA: Diagnosis not present

## 2016-08-04 DIAGNOSIS — Z6829 Body mass index (BMI) 29.0-29.9, adult: Secondary | ICD-10-CM | POA: Diagnosis not present

## 2016-08-04 DIAGNOSIS — I2510 Atherosclerotic heart disease of native coronary artery without angina pectoris: Secondary | ICD-10-CM | POA: Diagnosis not present

## 2016-08-04 DIAGNOSIS — I10 Essential (primary) hypertension: Secondary | ICD-10-CM | POA: Diagnosis not present

## 2016-08-04 DIAGNOSIS — E663 Overweight: Secondary | ICD-10-CM | POA: Diagnosis not present

## 2016-08-04 DIAGNOSIS — E782 Mixed hyperlipidemia: Secondary | ICD-10-CM | POA: Diagnosis not present

## 2016-08-04 DIAGNOSIS — E119 Type 2 diabetes mellitus without complications: Secondary | ICD-10-CM | POA: Diagnosis not present

## 2016-08-04 DIAGNOSIS — Z86010 Personal history of colonic polyps: Secondary | ICD-10-CM | POA: Diagnosis not present

## 2016-08-04 DIAGNOSIS — Z1389 Encounter for screening for other disorder: Secondary | ICD-10-CM | POA: Diagnosis not present

## 2016-09-01 DIAGNOSIS — I25118 Atherosclerotic heart disease of native coronary artery with other forms of angina pectoris: Secondary | ICD-10-CM | POA: Diagnosis not present

## 2016-09-01 DIAGNOSIS — I2510 Atherosclerotic heart disease of native coronary artery without angina pectoris: Secondary | ICD-10-CM | POA: Diagnosis not present

## 2016-09-01 DIAGNOSIS — I10 Essential (primary) hypertension: Secondary | ICD-10-CM | POA: Diagnosis not present

## 2016-09-01 DIAGNOSIS — I2583 Coronary atherosclerosis due to lipid rich plaque: Secondary | ICD-10-CM | POA: Diagnosis not present

## 2016-09-09 DIAGNOSIS — R69 Illness, unspecified: Secondary | ICD-10-CM | POA: Diagnosis not present

## 2016-10-26 DIAGNOSIS — R69 Illness, unspecified: Secondary | ICD-10-CM | POA: Diagnosis not present

## 2016-10-27 DIAGNOSIS — I2583 Coronary atherosclerosis due to lipid rich plaque: Secondary | ICD-10-CM | POA: Diagnosis not present

## 2016-10-27 DIAGNOSIS — I2510 Atherosclerotic heart disease of native coronary artery without angina pectoris: Secondary | ICD-10-CM | POA: Diagnosis not present

## 2016-10-27 DIAGNOSIS — I10 Essential (primary) hypertension: Secondary | ICD-10-CM | POA: Diagnosis not present

## 2016-11-26 DIAGNOSIS — L218 Other seborrheic dermatitis: Secondary | ICD-10-CM | POA: Diagnosis not present

## 2016-11-26 DIAGNOSIS — E119 Type 2 diabetes mellitus without complications: Secondary | ICD-10-CM | POA: Diagnosis not present

## 2016-12-02 ENCOUNTER — Emergency Department (HOSPITAL_COMMUNITY): Payer: MEDICARE

## 2016-12-02 ENCOUNTER — Inpatient Hospital Stay (HOSPITAL_COMMUNITY)
Admission: EM | Admit: 2016-12-02 | Discharge: 2016-12-03 | DRG: 065 | Disposition: A | Discharge status: Home / Self Care | Payer: MEDICARE | Attending: Internal Medicine | Admitting: Internal Medicine

## 2016-12-02 ENCOUNTER — Other Ambulatory Visit: Payer: Self-pay

## 2016-12-02 ENCOUNTER — Encounter (HOSPITAL_COMMUNITY): Payer: Self-pay | Admitting: Emergency Medicine

## 2016-12-02 ENCOUNTER — Inpatient Hospital Stay (HOSPITAL_COMMUNITY): Payer: MEDICARE

## 2016-12-02 DIAGNOSIS — I672 Cerebral atherosclerosis: Secondary | ICD-10-CM | POA: Diagnosis present

## 2016-12-02 DIAGNOSIS — I2510 Atherosclerotic heart disease of native coronary artery without angina pectoris: Secondary | ICD-10-CM | POA: Diagnosis present

## 2016-12-02 DIAGNOSIS — R55 Syncope and collapse: Secondary | ICD-10-CM | POA: Diagnosis present

## 2016-12-02 DIAGNOSIS — E785 Hyperlipidemia, unspecified: Secondary | ICD-10-CM | POA: Diagnosis not present

## 2016-12-02 DIAGNOSIS — G8190 Hemiplegia, unspecified affecting unspecified side: Secondary | ICD-10-CM | POA: Diagnosis not present

## 2016-12-02 DIAGNOSIS — I10 Essential (primary) hypertension: Secondary | ICD-10-CM | POA: Diagnosis not present

## 2016-12-02 DIAGNOSIS — I452 Bifascicular block: Secondary | ICD-10-CM | POA: Diagnosis present

## 2016-12-02 DIAGNOSIS — G8191 Hemiplegia, unspecified affecting right dominant side: Secondary | ICD-10-CM

## 2016-12-02 DIAGNOSIS — E860 Dehydration: Secondary | ICD-10-CM | POA: Diagnosis not present

## 2016-12-02 DIAGNOSIS — E781 Pure hyperglyceridemia: Secondary | ICD-10-CM | POA: Diagnosis not present

## 2016-12-02 DIAGNOSIS — Z8249 Family history of ischemic heart disease and other diseases of the circulatory system: Secondary | ICD-10-CM

## 2016-12-02 DIAGNOSIS — E876 Hypokalemia: Secondary | ICD-10-CM | POA: Diagnosis not present

## 2016-12-02 DIAGNOSIS — I6381 Other cerebral infarction due to occlusion or stenosis of small artery: Secondary | ICD-10-CM | POA: Diagnosis not present

## 2016-12-02 DIAGNOSIS — I959 Hypotension, unspecified: Secondary | ICD-10-CM | POA: Diagnosis present

## 2016-12-02 DIAGNOSIS — Z87891 Personal history of nicotine dependence: Secondary | ICD-10-CM

## 2016-12-02 DIAGNOSIS — I739 Peripheral vascular disease, unspecified: Secondary | ICD-10-CM | POA: Diagnosis present

## 2016-12-02 DIAGNOSIS — R1312 Dysphagia, oropharyngeal phase: Secondary | ICD-10-CM

## 2016-12-02 DIAGNOSIS — Z7902 Long term (current) use of antithrombotics/antiplatelets: Secondary | ICD-10-CM

## 2016-12-02 DIAGNOSIS — R1310 Dysphagia, unspecified: Secondary | ICD-10-CM

## 2016-12-02 DIAGNOSIS — Z79899 Other long term (current) drug therapy: Secondary | ICD-10-CM

## 2016-12-02 DIAGNOSIS — I679 Cerebrovascular disease, unspecified: Secondary | ICD-10-CM | POA: Diagnosis not present

## 2016-12-02 DIAGNOSIS — I6350 Cerebral infarction due to unspecified occlusion or stenosis of unspecified cerebral artery: Secondary | ICD-10-CM

## 2016-12-02 DIAGNOSIS — I6522 Occlusion and stenosis of left carotid artery: Secondary | ICD-10-CM | POA: Diagnosis not present

## 2016-12-02 DIAGNOSIS — R4020 Unspecified coma: Secondary | ICD-10-CM | POA: Diagnosis not present

## 2016-12-02 DIAGNOSIS — E1151 Type 2 diabetes mellitus with diabetic peripheral angiopathy without gangrene: Secondary | ICD-10-CM | POA: Diagnosis not present

## 2016-12-02 DIAGNOSIS — R4781 Slurred speech: Secondary | ICD-10-CM | POA: Diagnosis not present

## 2016-12-02 DIAGNOSIS — R471 Dysarthria and anarthria: Secondary | ICD-10-CM | POA: Diagnosis present

## 2016-12-02 DIAGNOSIS — N179 Acute kidney failure, unspecified: Secondary | ICD-10-CM | POA: Diagnosis not present

## 2016-12-02 DIAGNOSIS — Z79890 Hormone replacement therapy (postmenopausal): Secondary | ICD-10-CM

## 2016-12-02 DIAGNOSIS — I69341 Monoplegia of lower limb following cerebral infarction affecting right dominant side: Secondary | ICD-10-CM

## 2016-12-02 DIAGNOSIS — I639 Cerebral infarction, unspecified: Secondary | ICD-10-CM | POA: Diagnosis present

## 2016-12-02 DIAGNOSIS — R7989 Other specified abnormal findings of blood chemistry: Secondary | ICD-10-CM | POA: Diagnosis present

## 2016-12-02 DIAGNOSIS — I361 Nonrheumatic tricuspid (valve) insufficiency: Secondary | ICD-10-CM | POA: Diagnosis not present

## 2016-12-02 DIAGNOSIS — I6789 Other cerebrovascular disease: Secondary | ICD-10-CM | POA: Diagnosis not present

## 2016-12-02 DIAGNOSIS — Z7984 Long term (current) use of oral hypoglycemic drugs: Secondary | ICD-10-CM

## 2016-12-02 DIAGNOSIS — R4701 Aphasia: Secondary | ICD-10-CM | POA: Diagnosis present

## 2016-12-02 DIAGNOSIS — E039 Hypothyroidism, unspecified: Secondary | ICD-10-CM | POA: Diagnosis present

## 2016-12-02 HISTORY — DX: Hemiplegia, unspecified affecting right dominant side: G81.91

## 2016-12-02 LAB — COMPREHENSIVE METABOLIC PANEL
ALT: 33 U/L (ref 17–63)
ANION GAP: 8 (ref 5–15)
AST: 28 U/L (ref 15–41)
Albumin: 4.8 g/dL (ref 3.5–5.0)
Alkaline Phosphatase: 43 U/L (ref 38–126)
BUN: 24 mg/dL — ABNORMAL HIGH (ref 6–20)
CHLORIDE: 102 mmol/L (ref 101–111)
CO2: 27 mmol/L (ref 22–32)
CREATININE: 1.02 mg/dL (ref 0.61–1.24)
Calcium: 10.4 mg/dL — ABNORMAL HIGH (ref 8.9–10.3)
GFR calc non Af Amer: >60 mL/min (ref >60)
Glucose, Bld: 105 mg/dL — ABNORMAL HIGH (ref 65–99)
POTASSIUM: 3.6 mmol/L (ref 3.5–5.1)
SODIUM: 137 mmol/L (ref 135–145)
Total Bilirubin: 1 mg/dL (ref 0.3–1.2)
Total Protein: 8 g/dL (ref 6.5–8.1)

## 2016-12-02 LAB — PROTIME-INR
INR: 1.05
PROTHROMBIN TIME: 13.6 s (ref 11.4–15.2)

## 2016-12-02 LAB — I-STAT CHEM 8, ED
BUN: 24 mg/dL — ABNORMAL HIGH (ref 6–20)
CHLORIDE: 104 mmol/L (ref 101–111)
Calcium, Ion: 1.29 mmol/L (ref 1.15–1.40)
Creatinine, Ser: 1 mg/dL (ref 0.61–1.24)
Glucose, Bld: 102 mg/dL — ABNORMAL HIGH (ref 65–99)
HEMATOCRIT: 43 % (ref 39.0–52.0)
HEMOGLOBIN: 14.6 g/dL (ref 13.0–17.0)
POTASSIUM: 3.7 mmol/L (ref 3.5–5.1)
SODIUM: 141 mmol/L (ref 135–145)
TCO2: 27 mmol/L (ref 22–32)

## 2016-12-02 LAB — DIFFERENTIAL
BASOS PCT: 1 %
Basophils Absolute: 0 10*3/uL (ref 0.0–0.1)
EOS ABS: 0.2 10*3/uL (ref 0.0–0.7)
Eosinophils Relative: 4 %
Lymphocytes Relative: 33 %
Lymphs Abs: 1.7 10*3/uL (ref 0.7–4.0)
MONO ABS: 0.5 10*3/uL (ref 0.1–1.0)
Monocytes Relative: 10 %
NEUTROS ABS: 2.8 10*3/uL (ref 1.7–7.7)
Neutrophils Relative %: 52 %

## 2016-12-02 LAB — APTT: APTT: 28 s (ref 24–36)

## 2016-12-02 LAB — CBC
HCT: 44 % (ref 39.0–52.0)
Hemoglobin: 14 g/dL (ref 13.0–17.0)
MCH: 26.5 pg (ref 26.0–34.0)
MCHC: 31.8 g/dL (ref 30.0–36.0)
MCV: 83.3 fL (ref 78.0–100.0)
PLATELETS: 293 10*3/uL (ref 150–400)
RBC: 5.28 MIL/uL (ref 4.22–5.81)
RDW: 14.3 % (ref 11.5–15.5)
WBC: 5.3 10*3/uL (ref 4.0–10.5)

## 2016-12-02 LAB — I-STAT TROPONIN, ED: TROPONIN I, POC: 0.02 ng/mL (ref 0.00–0.08)

## 2016-12-02 LAB — GLUCOSE, CAPILLARY: Glucose-Capillary: 107 mg/dL — ABNORMAL HIGH (ref 65–99)

## 2016-12-02 MED ORDER — ADULT MULTIVITAMIN W/MINERALS CH
1.0000 | ORAL_TABLET | Freq: Every day | ORAL | Status: DC
  Administered 2016-12-03: 1 via ORAL
  Filled 2016-12-02: qty 1

## 2016-12-02 MED ORDER — SODIUM CHLORIDE 0.9 % IV SOLN: INTRAVENOUS | Status: DC

## 2016-12-02 MED ORDER — ACETAMINOPHEN 325 MG PO TABS: 650.0000 mg | ORAL_TABLET | ORAL | Status: DC | PRN

## 2016-12-02 MED ORDER — ATORVASTATIN CALCIUM 10 MG PO TABS
10.0000 mg | ORAL_TABLET | Freq: Every day | ORAL | Status: DC
  Administered 2016-12-02: 10 mg via ORAL
  Filled 2016-12-02: qty 1

## 2016-12-02 MED ORDER — ACETAMINOPHEN 160 MG/5ML PO SOLN: 650.0000 mg | ORAL | Status: DC | PRN

## 2016-12-02 MED ORDER — ROPINIROLE HCL 0.25 MG PO TABS
0.5000 mg | ORAL_TABLET | Freq: Every day | ORAL | Status: DC
  Administered 2016-12-02: 0.5 mg via ORAL
  Filled 2016-12-02: qty 2

## 2016-12-02 MED ORDER — ONDANSETRON HCL 4 MG/2ML IJ SOLN: 4.0000 mg | Freq: Four times a day (QID) | INTRAMUSCULAR | Status: DC | PRN

## 2016-12-02 MED ORDER — LEVOTHYROXINE SODIUM 50 MCG PO TABS
50.0000 ug | ORAL_TABLET | Freq: Every day | ORAL | Status: DC
  Administered 2016-12-03: 50 ug via ORAL
  Filled 2016-12-02: qty 1

## 2016-12-02 MED ORDER — ISOSORBIDE MONONITRATE ER 30 MG PO TB24
30.0000 mg | ORAL_TABLET | Freq: Every day | ORAL | Status: DC
  Administered 2016-12-03: 30 mg via ORAL
  Filled 2016-12-02: qty 1

## 2016-12-02 MED ORDER — SENNOSIDES-DOCUSATE SODIUM 8.6-50 MG PO TABS: 1.0000 | ORAL_TABLET | Freq: Every evening | ORAL | Status: DC | PRN

## 2016-12-02 MED ORDER — HYDRALAZINE HCL 20 MG/ML IJ SOLN: 10.0000 mg | Freq: Four times a day (QID) | INTRAMUSCULAR | Status: DC | PRN

## 2016-12-02 MED ORDER — TAMSULOSIN HCL 0.4 MG PO CAPS
0.4000 mg | ORAL_CAPSULE | Freq: Every day | ORAL | Status: DC
  Administered 2016-12-03: 0.4 mg via ORAL
  Filled 2016-12-02: qty 1

## 2016-12-02 MED ORDER — ENOXAPARIN SODIUM 40 MG/0.4ML ~~LOC~~ SOLN
40.0000 mg | SUBCUTANEOUS | Status: DC
  Administered 2016-12-02: 40 mg via SUBCUTANEOUS
  Filled 2016-12-02: qty 0.4

## 2016-12-02 MED ORDER — INSULIN ASPART 100 UNIT/ML ~~LOC~~ SOLN: 0.0000 [IU] | Freq: Three times a day (TID) | SUBCUTANEOUS | Status: DC

## 2016-12-02 MED ORDER — CLOPIDOGREL BISULFATE 75 MG PO TABS
75.0000 mg | ORAL_TABLET | Freq: Every day | ORAL | Status: DC
  Administered 2016-12-03: 75 mg via ORAL
  Filled 2016-12-02: qty 1

## 2016-12-02 MED ORDER — STROKE: EARLY STAGES OF RECOVERY BOOK
Freq: Once | Status: DC
  Filled 2016-12-02: qty 1

## 2016-12-02 MED ORDER — OMEGA-3-ACID ETHYL ESTERS 1 G PO CAPS
1000.0000 mg | ORAL_CAPSULE | Freq: Every day | ORAL | Status: DC
Start: 2016-12-03 — End: 2016-12-03
  Administered 2016-12-03: 1000 mg via ORAL
  Filled 2016-12-02: qty 1

## 2016-12-02 MED ORDER — ASPIRIN EC 81 MG PO TBEC
81.0000 mg | DELAYED_RELEASE_TABLET | Freq: Every day | ORAL | Status: DC
  Administered 2016-12-02 – 2016-12-03 (×2): 81 mg via ORAL
  Filled 2016-12-02 (×2): qty 1

## 2016-12-02 MED ORDER — ACETAMINOPHEN 650 MG RE SUPP: 650.0000 mg | RECTAL | Status: DC | PRN

## 2016-12-02 NOTE — ED Notes (Signed)
PT refusing to go upstairs to inpatient room. Hospitalist paged at this time. Charge nurse for [facility]) made aware that pt refusing to be admitted.

## 2016-12-02 NOTE — ED Notes (Signed)
Pt returned from CT °

## 2016-12-02 NOTE — ED Notes (Signed)
Report given to [facility] at this time for room 332.

## 2016-12-02 NOTE — ED Triage Notes (Signed)
Patient complains of slurred speech and weakness that started yesterday at 10am. Patient has history of stroke and right sided weakness. Patient states dizziness and drooling. States chokes when he swallows.

## 2016-12-02 NOTE — ED Provider Notes (Signed)
[facility] EMERGENCY DEPARTMENT Provider Note   CSN: [id number] Arrival date & time: 12/02/16  1122     History   Chief Complaint Chief Complaint  Patient presents with  . Aphasia  . Weakness    HPI Steve Ramirez is a 81 y.o. male.  HPI Patient has history of previous stroke with residual right leg weakness.  Presents with slurred speech, drooling, difficulty swallowing with that he noticed around 8 AM yesterday morning.  States when he woke up felt to be at his baseline.  He has not noticed any new right upper or lower extremity weakness.  He did state that today while walking he kept drifting to the right.  Claims that his tongue feels "funny".  He is on both Plavix and aspirin and states his been compliant with these medications.  Denies any known trauma.  Denies headache, visual changes, chest pain or abdominal pain.  No nausea or vomiting. Past Medical History:  Diagnosis Date  . Coronary atherosclerosis of native coronary artery   . Essential hypertension, benign   . GIB (gastrointestinal bleeding)    Colonic diverticular bleed  . History of melanoma   . History of TIAs   . Type 2 diabetes mellitus [facility])     Patient Active Problem List   Diagnosis Date Noted  . Right hemiparesis ([place]) 12/02/2016  . Acute CVA (cerebrovascular accident) ([place]) 09/13/2015  . TIA (transient ischemic attack) 11/29/2014  . Shoulder stiffness 09/05/2012  . Right rotator cuff tear 08/16/2012  . Shoulder subluxation, right 08/09/2012  . Elevated transaminase level 11/05/2011  . Coronary atherosclerosis of native coronary artery 11/26/2010  . Essential hypertension, benign 11/23/2010  . Type 2 diabetes mellitus ([place]) 11/23/2010  . Hyperlipidemia 11/23/2010  . History of TIAs 11/23/2010    Past Surgical History:  Procedure Laterality Date  . COLONOSCOPY  08/13/2010   Procedure: COLONOSCOPY;  Surgeon: Rogene Houston, MD;  [facility];  Service: Endoscopy;  Laterality:  N/A;  . KNEE SURGERY    . LEFT HEART CATHETERIZATION WITH CORONARY ANGIOGRAM N/A 11/24/2010   Procedure: LEFT HEART CATHETERIZATION WITH CORONARY ANGIOGRAM;  Surgeon: Josue Hector, MD;  Location: [facility] CATH LAB;  Service: Cardiovascular;  Laterality: N/A;       Home Medications    Prior to Admission medications   Medication Sig Start Date End Date Taking? Authorizing Provider  cetirizine (ZYRTEC) 10 MG tablet Take 10 mg by mouth daily.   Yes [provider]  clopidogrel (PLAVIX) 75 MG tablet Take 1 tablet (75 mg total) by mouth daily. 08/08/12  Yes Lendon Colonel, NP  fenofibrate (TRICOR) 145 MG tablet Take 145 mg by mouth daily.   Yes [provider]  isosorbide mononitrate (IMDUR) 30 MG 24 hr tablet Take 1 tablet by mouth daily. 11/30/16  Yes [provider]  levothyroxine (SYNTHROID, LEVOTHROID) 50 MCG tablet Take 1 tablet by mouth daily. 11/13/16  Yes [provider]  lisinopril-hydrochlorothiazide (PRINZIDE,ZESTORETIC) 20-12.5 MG per tablet Take 1 tablet by mouth 2 (two) times daily.   Yes [provider]  metFORMIN (GLUCOPHAGE) 500 MG tablet Take 500 mg by mouth 2 (two) times daily.   Yes [provider]  metoprolol succinate (TOPROL-XL) 50 MG 24 hr tablet Take 50 mg by mouth daily. Take with or immediately following a meal.   Yes [provider]  Multiple Vitamin (MULTIVITAMIN) tablet Take 1 tablet by mouth daily.     Yes [provider]  Omega-3 Fatty  Acids (OMEGA 3 PO) Take 1,000 mg by mouth daily.    Yes [provider]  pravastatin (PRAVACHOL) 10 MG tablet Take 1 tablet by mouth daily. 09/04/16  Yes [provider]  Saw Palmetto 450 MG CAPS Take 1 capsule by mouth daily.   Yes [provider]  spironolactone (ALDACTONE) 25 MG tablet Take 1 tablet by mouth daily. 09/28/16  Yes [provider]  tamsulosin (FLOMAX) 0.4 MG CAPS capsule Take 0.4 mg by mouth daily.   Yes  [provider]  atorvastatin (LIPITOR) 10 MG tablet Take 1 tablet (10 mg total) by mouth daily at 6 PM. 09/14/15   Donne Hazel, MD  nitroGLYCERIN (NITROSTAT) 0.4 MG SL tablet Place 0.4 mg under the tongue every 5 (five) minutes as needed for chest pain.    [provider]  rOPINIRole (REQUIP) 0.5 MG tablet Take 1 tablet by mouth 2 (two) times daily. 09/15/16   [provider]    Family History Family History  Problem Relation Age of Onset  . Coronary artery disease Father   . Coronary artery disease Brother   . Asthma Unknown     Social History Social History   Tobacco Use  . Smoking status: Former Smoker    Types: Cigarettes    Last attempt to quit: 01/13/1980    Years since quitting: 36.9  . Smokeless tobacco: Never Used  Substance Use Topics  . Alcohol use: No  . Drug use: No     Allergies   Patient has no known allergies.   Review of Systems Review of Systems  Constitutional: Negative for chills and fever.  HENT: Positive for trouble swallowing and voice change. Negative for facial swelling and sore throat.   Eyes: Negative for visual disturbance.  Respiratory: Negative for cough, chest tightness and shortness of breath.   Cardiovascular: Negative for chest pain.  Gastrointestinal: Negative for abdominal pain, diarrhea, nausea and vomiting.  Genitourinary: Negative for dysuria, flank pain and frequency.  Musculoskeletal: Positive for gait problem. Negative for back pain, neck pain and neck stiffness.  Skin: Negative for rash and wound.  Neurological: Positive for speech difficulty. Negative for weakness, light-headedness, numbness and headaches.  All other systems reviewed and are negative.    Physical Exam Updated Vital Signs BP (!) 142/63   Pulse (!) 53   Temp 97.6 F (36.4 C)   Resp 18   Ht 5\' 8"  (1.727 m)   Wt 86.2 kg (190 lb)   SpO2 99%   BMI 28.89 kg/m   Physical Exam  Constitutional: He is oriented to person, place,  and time. He appears well-developed and well-nourished. No distress.  HENT:  Head: Normocephalic and atraumatic.  Mouth/Throat: Oropharynx is clear and moist. No oropharyngeal exudate.  No forehead weakness.  Very mild right lower facial droop.  Mild right-sided ptosis.  Tongue veers to the right  Eyes: EOM are normal. Pupils are equal, round, and reactive to light.  Neck: Normal range of motion. Neck supple.  Cardiovascular: Normal rate and regular rhythm. Exam reveals no gallop and no friction rub.  No murmur heard. Pulmonary/Chest: Effort normal and breath sounds normal. No stridor. No respiratory distress. He has no wheezes. He has no rales. He exhibits no tenderness.  Abdominal: Soft. Bowel sounds are normal. There is no tenderness. There is no rebound and no guarding.  Musculoskeletal: Normal range of motion. He exhibits no edema or tenderness.  Neurological: He is alert and oriented to person, place, and time.  Mildly slurred speech.  4/5 motor in right upper extremity compared to 5/5 motor in left upper extremity.  4/5 motor in right lower extremity compared to 5/5 motor in left lower extremity.  Bilateral finger to nose intact.  Sensation to light touch intact.  Skin: Skin is warm and dry. Capillary refill takes less than 2 seconds. No rash noted. No erythema.  Psychiatric: He has a normal mood and affect. His behavior is normal.  Nursing note and vitals reviewed.    ED Treatments / Results  Labs (all labs ordered are listed, but only abnormal results are displayed) Labs Reviewed  COMPREHENSIVE METABOLIC PANEL - Abnormal; Notable for the following components:      Result Value   Glucose, Bld 105 (*)    BUN 24 (*)    Calcium 10.4 (*)    All other components within normal limits  I-STAT CHEM 8, ED - Abnormal; Notable for the following components:   BUN 24 (*)    Glucose, Bld 102 (*)    All other components within normal limits  PROTIME-INR  APTT  CBC  DIFFERENTIAL  I-STAT  TROPONIN, ED    EKG  EKG Interpretation  Date/Time:  Wednesday December 02 2016 11:29:46 EST Ventricular Rate:  59 PR Interval:    QRS Duration: 167 QT Interval:  454 QTC Calculation: 450 R Axis:   -56 Text Interpretation:  Sinus rhythm RBBB and LAFB Baseline wander in lead(s) III Confirmed by Julianne Rice [phone]) on 12/02/2016 12:30:39 PM       Radiology Ct Head Wo Contrast  Result Date: 12/02/2016 CLINICAL DATA:  Slurred speech and altered mental status. Right-sided weakness. Dizziness. EXAM: CT HEAD WITHOUT CONTRAST TECHNIQUE: Contiguous axial images were obtained from the base of the skull through the vertex without intravenous contrast. COMPARISON:  Head CT and brain MRI September 13, 2015 FINDINGS: Brain: There is mild diffuse atrophy. There is no intracranial mass, hemorrhage, extra-axial fluid collection, or midline shift. There is patchy small vessel disease throughout the centra semiovale bilaterally. There is a prior small infarct in the anterior left thalamus as well as a nearby small infarct in the posterior left lentiform nucleus. Small vessel disease is noted in the external capsule on the left. There is small vessel disease with prior small infarct in the lateral right upper pons. There is no new gray-white compartment lesion. No acute infarct is demonstrable on this study. Vascular: There is no appreciable hyperdense vessel. There is calcification in each carotid siphon region as well as in the distal left vertebral artery. Skull: Bony calvarium appears intact. Sinuses/Orbits: There is mucosal thickening in multiple ethmoid air cells. There is mucosal thickening in the right maxillary antrum. Other visualized paranasal sinuses are clear. Visualized orbits appear symmetric bilaterally. Other: Mastoid air cells are clear. IMPRESSION: Atrophy with extensive supratentorial small vessel disease. Prior small infarcts noted in the anterior left thalamus, posterior left lentiform  nucleus, left external capsule, and upper right pons. No acute infarct demonstrable on this study. No intracranial mass, hemorrhage, or extra-axial fluid collection. There are foci of arterial vascular calcification. Foci of paranasal sinus disease noted. Electronically Signed   By: Lowella Grip III M.D.   On: 12/02/2016 12:12    Procedures Procedures (including critical care time)  Medications Ordered in ED Medications - No data to display   Initial Impression / Assessment and Plan / ED Course  I have reviewed the triage vital signs and the nursing notes.  Pertinent labs & imaging results  that were available during my care of the patient were reviewed by me and considered in my medical decision making (see chart for details).     Discussed with Dr. Carles Collet.  Will see patient in the emergency department and admit.  Final Clinical Impressions(s) / ED Diagnoses   Final diagnoses:  Cerebrovascular accident (CVA), unspecified mechanism [facility])    ED Discharge Orders    None       Julianne Rice, MD 12/02/16 1339

## 2016-12-02 NOTE — ED Notes (Signed)
Update given to Horris Latino, RN for report. PT going to [phone] at this time.

## 2016-12-02 NOTE — H&P (Signed)
History and Physical  DAJON LAZAR HAL:[id number] DOB: Jul 09, 1935 DOA: 12/02/2016   PCP: Sharilyn Sites, MD   Patient coming from: Home  Chief Complaint:   HPI:  Steve Ramirez is a 81 y.o. 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  The patient denied any new right upper extremity or right lower extremity weakness, but stated that while he was walking on December 02, 2016, he kept drifting to his right.  In addition, the patient states that his dysarthria worsened on the morning of December 02, 2016.  The patient also has some drooling on the right side of his mouth with worsening dysphagia type symptoms.  As a result, the patient presented to emergency department for further evaluation. He denies any fevers, chills, chest pain, shortness of breath, coughing, hemoptysis, nausea, vomiting, diarrhea, abdominal pain, dysuria, hematuria.   In the emergency department, BMP, CBC, LFTs were unremarkable.  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  CT of the brain was negative for any acute findings.  MRI of the brain showed an acute lacunar infarct in the left lenticulostriate artery territory.  Initially, the patient wanted to leave [place].  The risks, benefits, and alternatives were discussed with the patient and his family.  After an extensive discussion, the patient finally agreed to stay in the hospital overnight.  Assessment/Plan: Dysarthria -Concerned about stroke -Neurology Consult not available--offered transfer to [place]--he absolutely refuses after discussion of risks, benefits, alternatives -case discussed with neurology, Dr. Loleta Books + Plavix -PT/OT evaluation -Speech  therapy eval-->regular diet with thin liquids -CT brain--neg for acute findings -MRI brain--small acute lacunar infarct lenticulostriate artery territory -MRA brain--progressed left MCA M2 branch stenosis, now moderate.  - moderate to severe chronic stenosis of the right PCA.  - chronic mild to moderate stenosis at the left ACA origin and right  MCA bifurcation. -Carotid Duplex--pending -Echo--pending -LDL--pending -HbA1C--pending -Antiplatelet--ASA + Plavix  Dysphagia -appreciate speech eval-->regular with thin  Essential hypertension -Holding lisinopril/HCTZ, Aldactone, and metoprolol succinate to allow for permissive hypertension -Hydralazine prn SBP>220  Diabetes mellitus type 2 -NovoLog sliding scale -Hemoglobin A1c -Holding metformin  Hyperlipidemia -Continue statin -Continue TriCor  Hypothyroidism -Continue Synthroid       Past Medical History:  Diagnosis Date  . Coronary atherosclerosis of native coronary artery   . Essential hypertension, benign   . GIB (gastrointestinal bleeding)    Colonic diverticular bleed  . History of melanoma   . History of TIAs   . Type 2 diabetes mellitus ([place])    Past Surgical History:  Procedure Laterality Date  . COLONOSCOPY  08/13/2010   Procedure: COLONOSCOPY;  Surgeon: Rogene Houston, MD;  [facility];  Service: Endoscopy;  Laterality: N/A;  . KNEE SURGERY    . LEFT HEART CATHETERIZATION WITH CORONARY ANGIOGRAM N/A 11/24/2010   Procedure: LEFT HEART CATHETERIZATION WITH CORONARY ANGIOGRAM;  Surgeon: Josue Hector, MD;  Location: [facility] CATH LAB;  Service: Cardiovascular;  Laterality: N/A;   Social History:  reports that he quit smoking about 36 years ago. His smoking use included cigarettes. he has never used smokeless tobacco. He reports that he does not drink alcohol or use drugs.   Family History  Problem Relation Age of Onset  .  Coronary artery disease Father   . Coronary artery disease Brother   .  Asthma Unknown      No Known Allergies   Prior to Admission medications   Medication Sig Start Date End Date Taking? Authorizing Provider  cetirizine (ZYRTEC) 10 MG tablet Take 10 mg by mouth daily.   Yes [provider]  clopidogrel (PLAVIX) 75 MG tablet Take 1 tablet (75 mg total) by mouth daily. 08/08/12  Yes Lendon Colonel, NP  fenofibrate (TRICOR) 145 MG tablet Take 145 mg by mouth daily.   Yes [provider]  isosorbide mononitrate (IMDUR) 30 MG 24 hr tablet Take 1 tablet by mouth daily. 11/30/16  Yes [provider]  levothyroxine (SYNTHROID, LEVOTHROID) 50 MCG tablet Take 1 tablet by mouth daily. 11/13/16  Yes [provider]  lisinopril-hydrochlorothiazide (PRINZIDE,ZESTORETIC) 20-12.5 MG per tablet Take 1 tablet by mouth 2 (two) times daily.   Yes [provider]  metFORMIN (GLUCOPHAGE) 500 MG tablet Take 500 mg by mouth 2 (two) times daily.   Yes [provider]  metoprolol succinate (TOPROL-XL) 50 MG 24 hr tablet Take 50 mg by mouth daily. Take with or immediately following a meal.   Yes [provider]  Multiple Vitamin (MULTIVITAMIN) tablet Take 1 tablet by mouth daily.     Yes [provider]  Omega-3 Fatty Acids (OMEGA 3 PO) Take 1,000 mg by mouth daily.    Yes [provider]  pravastatin (PRAVACHOL) 10 MG tablet Take 1 tablet by mouth daily. 09/04/16  Yes [provider]  Saw Palmetto 450 MG CAPS Take 1 capsule by mouth daily.   Yes [provider]  spironolactone (ALDACTONE) 25 MG tablet Take 1 tablet by mouth daily. 09/28/16  Yes [provider]  tamsulosin (FLOMAX) 0.4 MG CAPS capsule Take 0.4 mg by mouth daily.   Yes [provider]  atorvastatin (LIPITOR) 10 MG tablet Take 1 tablet (10 mg total) by mouth daily at 6 PM. 09/14/15   Donne Hazel, MD  nitroGLYCERIN (NITROSTAT) 0.4 MG SL tablet Place 0.4 mg under the tongue every 5 (five) minutes as needed  for chest pain.    [provider]  rOPINIRole (REQUIP) 0.5 MG tablet Take 1 tablet by mouth 2 (two) times daily. 09/15/16   [provider]    Review of Systems:  Constitutional:  No weight loss, night sweats, Fevers, chills, fatigue.  Head&Eyes: No headache.  No vision loss.  No eye pain or scotoma ENT:  No Difficulty swallowing,Tooth/dental problems,Sore throat,  No ear ache, post nasal drip,  Cardio-vascular:  No chest pain, Orthopnea, PND, swelling in lower extremities,  dizziness, palpitations  GI:  No  abdominal pain, nausea, vomiting, diarrhea, loss of appetite, hematochezia, melena, heartburn, indigestion, Resp:  No shortness of breath with exertion or at rest. No cough. No coughing up of blood .No wheezing.No chest wall deformity  Skin:  no rash or lesions.  GU:  no dysuria, change in color of urine, no urgency or frequency. No flank pain.  Musculoskeletal:  No joint pain or swelling. No decreased range of motion. No back pain.  Psych:  No change in mood or affect. No depression or anxiety. Neurologic: No headache, no dysesthesia, no focal weakness, no vision loss. No syncope  Physical Exam: Vitals:   12/02/16 1200 12/02/16 1215 12/02/16 1230 12/02/16 1300  BP:   139/62 (!) 142/63  Pulse: (!) 57 (!) 56 (!) 57 (!) 53  Resp: 18 20 19  18  Temp:      TempSrc:      SpO2: 99% 100% 96% 99%  Weight:      Height:       General:  A&O x 3, NAD, nontoxic, pleasant/cooperative Head/Eye: No conjunctival hemorrhage, no icterus, [place]/AT, No nystagmus ENT:  No icterus,  No thrush, good dentition, no pharyngeal exudate Neck:  No masses, no lymphadenpathy, no bruits CV:  RRR, no rub, no gallop, no S3 Lung:  CTAB, good air movement, no wheeze, no rhonchi Abdomen: soft/NT, +BS, nondistended, no peritoneal signs Ext: No cyanosis, No rashes, No petechiae, No lymphangitis, No edema Neuro: CNII-XII intact, strength 4/5 in bilateral upper and lower extremities, no  dysmetria  Labs on Admission:  Basic Metabolic Panel: Recent Labs  Lab 12/02/16 1133 12/02/16 1136  NA 137 141  K 3.6 3.7  CL 102 104  CO2 27  --   GLUCOSE 105* 102*  BUN 24* 24*  CREATININE 1.02 1.00  CALCIUM 10.4*  --    Liver Function Tests: Recent Labs  Lab 12/02/16 1133  AST 28  ALT 33  ALKPHOS 43  BILITOT 1.0  PROT 8.0  ALBUMIN 4.8   No results for input(s): LIPASE, AMYLASE in the last 168 hours. No results for input(s): AMMONIA in the last 168 hours. CBC: Recent Labs  Lab 12/02/16 1133 12/02/16 1136  WBC 5.3  --   NEUTROABS 2.8  --   HGB 14.0 14.6  HCT 44.0 43.0  MCV 83.3  --   PLT 293  --    Coagulation Profile: Recent Labs  Lab 12/02/16 1133  INR 1.05   Cardiac Enzymes: No results for input(s): CKTOTAL, CKMB, CKMBINDEX, TROPONINI in the last 168 hours. BNP: Invalid input(s): POCBNP CBG: No results for input(s): GLUCAP in the last 168 hours. Urine analysis:    Component Value Date/Time   COLORURINE YELLOW 09/13/2015 0906   APPEARANCEUR CLEAR 09/13/2015 0906   LABSPEC 1.010 09/13/2015 0906   PHURINE 6.5 09/13/2015 0906   GLUCOSEU NEGATIVE 09/13/2015 0906   HGBUR NEGATIVE 09/13/2015 0906   BILIRUBINUR NEGATIVE 09/13/2015 0906   KETONESUR NEGATIVE 09/13/2015 0906   PROTEINUR NEGATIVE 09/13/2015 0906   UROBILINOGEN 0.2 04/15/2010 1425   NITRITE NEGATIVE 09/13/2015 0906   LEUKOCYTESUR NEGATIVE 09/13/2015 0906   Sepsis Labs: @LABRCNTIP (procalcitonin:4,lacticidven:4) )No results found for this or any previous visit (from the past 240 hour(s)).   Radiological Exams on Admission: Ct Head Wo Contrast  Result Date: 12/02/2016 CLINICAL DATA:  Slurred speech and altered mental status. Right-sided weakness. Dizziness. EXAM: CT HEAD WITHOUT CONTRAST TECHNIQUE: Contiguous axial images were obtained from the base of the skull through the vertex without intravenous contrast. COMPARISON:  Head CT and brain MRI September 13, 2015 FINDINGS: Brain: There  is mild diffuse atrophy. There is no intracranial mass, hemorrhage, extra-axial fluid collection, or midline shift. There is patchy small vessel disease throughout the centra semiovale bilaterally. There is a prior small infarct in the anterior left thalamus as well as a nearby small infarct in the posterior left lentiform nucleus. Small vessel disease is noted in the external capsule on the left. There is small vessel disease with prior small infarct in the lateral right upper pons. There is no new gray-white compartment lesion. No acute infarct is demonstrable on this study. Vascular: There is no appreciable hyperdense vessel. There is calcification in each carotid siphon region as well as in the distal left vertebral artery. Skull: Bony calvarium appears intact. Sinuses/Orbits: There is mucosal thickening  in multiple ethmoid air cells. There is mucosal thickening in the right maxillary antrum. Other visualized paranasal sinuses are clear. Visualized orbits appear symmetric bilaterally. Other: Mastoid air cells are clear. IMPRESSION: Atrophy with extensive supratentorial small vessel disease. Prior small infarcts noted in the anterior left thalamus, posterior left lentiform nucleus, left external capsule, and upper right pons. No acute infarct demonstrable on this study. No intracranial mass, hemorrhage, or extra-axial fluid collection. There are foci of arterial vascular calcification. Foci of paranasal sinus disease noted. Electronically Signed   By: Lowella Grip III M.D.   On: 12/02/2016 12:12    EKG: Independently reviewed. Sinus, RBBB, LAFB    Time spent:70 minutes Code Status:   FULL Family Communication:  Spouse and daughter upated at bedside Disposition Plan: expect 1-2 day hospitalization Consults called: neurology DVT Prophylaxis: [employer] Lovenox  Ithiel Liebler, DO  [facility] Pager [phone]  If 7PM-7AM, please contact night-coverage [email or web address] Password [email or web address] 12/02/2016,  1:35 PM

## 2016-12-02 NOTE — Evaluation (Signed)
Occupational Therapy Evaluation Patient Details Name: Steve Ramirez MRN: [id number] DOB: [birth year] Today's Date: 12/02/2016    History of Present Illness Steve Ramirez is a 81 y.o. 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    Clinical Impression   Patient is presenting on baseline for UB strength, ROM, and coordination. Patient reports that after his previous CVA he was left with some right side UE weakness and he is unable to return to his hobby of calligraphy. Pt is safe to return home with out any follow up OT services. Thank you for the referral.      Follow Up Recommendations  No OT follow up    Equipment Recommendations  None recommended by OT       Precautions / Restrictions Precautions Precautions: Fall Precaution Comments: Due to hx of falls. Pt is aware of his ability to fall and is able to get up independently from floor. Restrictions Weight Bearing Restrictions: No      Mobility   Transfers Overall transfer level: Modified independent Equipment used: None             General transfer comment: sit to stand/stand to sit         ADL either performed or assessed with clinical judgement   ADL Overall ADL's : Independent;At baseline           Vision Baseline Vision/History: Wears glasses Wears Glasses: At all times Patient Visual Report: No change from baseline              Pertinent Vitals/Pain Pain Assessment: No/denies pain     Hand Dominance Right   Extremity/Trunk Assessment Upper Extremity Assessment Upper Extremity Assessment: Overall WFL for tasks assessed(Slight weakness on right UE although patient reports that his is his baseline from previous CVA)   Lower Extremity Assessment Lower  Extremity Assessment: Defer to PT evaluation      Communication Communication Communication: Expressive difficulties   Cognition Arousal/Alertness: Awake/alert Behavior During Therapy: WFL for tasks assessed/performed Overall Cognitive Status: Within Functional Limits for tasks assessed                    Home Living Family/patient expects to be discharged to:: Private residence Living Arrangements: Spouse/significant other Available Help at Discharge: Family Type of Home: House Home Access: Stairs to enter [employer] of Steps: 5 steps with railing on both sides Entrance Stairs-Rails: Right;Left;Can reach both Home Layout: One level     Bathroom Shower/Tub: Walk-in shower;Other (comment)   Bathroom Toilet: Handicapped height     Home Equipment: Walker - 2 wheels;Cane - single point;Shower seat - built in;Hand held shower head;Wheelchair - manual          Prior Functioning/Environment Level of Independence: Independent                          OT Goals(Current goals can be found in the care plan section) Acute Rehab OT Goals Patient Stated Goal: to go home and eat thanksgiving dinner   OT Frequency:     Barriers to D/C:            Co-evaluation PT/OT/SLP Co-Evaluation/Treatment: Yes Reason for Co-Treatment: For patient/therapist safety;To address functional/ADL transfers   OT goals addressed during session: ADL's and self-care;Strengthening/ROM  AM-PAC PT "6 Clicks" Daily Activity     Outcome Measure Help from another person eating meals?: None Help from another person taking care of personal grooming?: None Help from another person toileting, which includes using toliet, bedpan, or urinal?: None Help from another person bathing (including washing, rinsing, drying)?: None Help from another person to put on and taking off regular upper body clothing?: None Help from another person to put on and taking off regular lower body  clothing?: None 6 Click Score: 24   End of Session Equipment Utilized During Treatment: Gait belt Nurse Communication: Mobility status  Activity Tolerance: Patient tolerated treatment well Patient left: in bed;with call bell/phone within reach  OT Visit Diagnosis: Muscle weakness (generalized) (M62.81)                Time: 7681-1572 OT Time Calculation (min): 25 min Charges:  OT General Charges $OT Visit: 1 Visit OT Evaluation $OT Eval Low Complexity: 1 Low G-Codes: OT G-codes **NOT FOR INPATIENT CLASS** Functional Assessment Tool Used: AM-PAC 6 Clicks Daily Activity Functional Limitation: Self care Self Care Current Status (I2035): 0 percent impaired, limited or restricted Self Care Goal Status (D9741): 0 percent impaired, limited or restricted Self Care Discharge Status (U3845): 0 percent impaired, limited or restricted   Ailene Ravel, OTR/L,CBIS  [phone]   Everard Interrante, Clarene Duke 12/02/2016, 5:05 PM

## 2016-12-02 NOTE — Evaluation (Signed)
Physical Therapy Evaluation Patient Details Name: Steve Ramirez MRN: [id number] DOB: [birth year] Today's Date: 12/02/2016   History of Present Illness  Steve Ramirez is a 81 y.o. 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   Clinical Impression  Patient received with RN completing neuro screen; noted no significant deficits in strength or coordination during observation of tests performed by RN. Patient very pleasant and willing to participate in PT this afternoon. Skilled PT evaluation of MMT, functional mobility, safety awareness, gait, and balance shows only minor deficit primarily involving acute minor gait deviation. Patient reports he falls quite frequently on unsteady surfaces such as grass, but has worked hard and is able to confidently get up on his own, he also demonstrates good safety awareness during PT examination today. Offered patient skilled OP PT services due to acute gait deviation and history of frequent falls, however patient reports he is fairly happy with his current level of function and is hesitant to commit to OP PT at this time. RN educated regarding patient mobility and safety. Patient does not appear to be in need of skilled PT services during his stay in this facility. Patient left sitting at EOB with all needs met and concerns addressed, thank you for the opportunity to participate in the care of this patient, PT signing off.     Follow Up Recommendations Outpatient PT;Other (comment)(offered skilled OP PT services, patient hesitant and reports he will think about it )    Equipment Recommendations  None recommended by PT    Recommendations for Other Services       Precautions / Restrictions Precautions Precautions:  None Restrictions Weight Bearing Restrictions: No      Mobility  Bed Mobility Overal bed mobility: Independent             General bed mobility comments: supine to sit  Transfers Overall transfer level: Modified independent Equipment used: None             General transfer comment: sit to stand/stand to sit   Ambulation/Gait Ambulation/Gait assistance: Modified independent (Device/Increase time) Ambulation Distance (Feet): 160 Feet Assistive device: None Gait Pattern/deviations: Step-through pattern;Decreased dorsiflexion - right;Decreased weight shift to right;Drifts right/left;Trunk flexed     General Gait Details: mutliple steps to make 180 degree turn in hallway but steady   Stairs            Wheelchair Mobility    Modified Rankin (Stroke Patients Only)       Balance Overall balance assessment: Modified Independent                                           Pertinent Vitals/Pain Pain Assessment: No/denies pain    Home Living Family/patient expects to be discharged to:: Private residence Living Arrangements: Spouse/significant other Available Help at Discharge: Family Type of Home: House Home Access: Stairs to enter Entrance Stairs-Rails: Right;Left;Can reach both Entrance Stairs-Number of Steps: 5 steps with railing on both sides Home Layout: One level Home Equipment: Walker - 2 wheels;Cane - single point;Shower seat - built in;Hand held shower head;Wheelchair - manual      Prior Function Level of Independence: Independent  Hand Dominance   Dominant Hand: Right    Extremity/Trunk Assessment   Upper Extremity Assessment Upper Extremity Assessment: Defer to OT evaluation    Lower Extremity Assessment Lower Extremity Assessment: Overall WFL for tasks assessed    Cervical / Trunk Assessment Cervical / Trunk Assessment: Normal  Communication   Communication: Expressive difficulties   Cognition Arousal/Alertness: Awake/alert Behavior During Therapy: WFL for tasks assessed/performed Overall Cognitive Status: Within Functional Limits for tasks assessed                                        General Comments General comments (skin integrity, edema, etc.): patient appears functionally safe and steady with ambulation, has good safety awareness for functional gait however does report acute gait change since this most recent CVA     Exercises     Assessment/Plan    PT Assessment All further PT needs can be met in the next venue of care  PT Problem List Decreased balance;Decreased mobility       PT Treatment Interventions      PT Goals (Current goals can be found in the Care Plan section)  Acute Rehab PT Goals Patient Stated Goal: to go home and eat thanksgiving dinner  PT Goal Formulation: With patient Time For Goal Achievement: 12/16/16 Potential to Achieve Goals: Good    Frequency     Barriers to discharge        Co-evaluation   Reason for Co-Treatment: For patient/therapist safety;To address functional/ADL transfers   OT goals addressed during session: ADL's and self-care;Strengthening/ROM       AM-PAC PT "6 Clicks" Daily Activity  Outcome Measure Difficulty turning over in bed (including adjusting bedclothes, sheets and blankets)?: None Difficulty moving from lying on back to sitting on the side of the bed? : None Difficulty sitting down on and standing up from a chair with arms (e.g., wheelchair, bedside commode, etc,.)?: None Help needed moving to and from a bed to chair (including a wheelchair)?: None Help needed walking in hospital room?: None Help needed climbing 3-5 steps with a railing? : A Little 6 Click Score: 23    End of Session Equipment Utilized During Treatment: Gait belt Activity Tolerance: Patient tolerated treatment well Patient left: in bed;with call bell/phone within reach Nurse Communication: Mobility  status(patient safe to mobilize independently in room with no device ) PT Visit Diagnosis: Other abnormalities of gait and mobility (R26.89);History of falling (Z91.81)    Time: 4239-5320 PT Time Calculation (min) (ACUTE ONLY): 25 min   Charges:   PT Evaluation $PT Eval Low Complexity: 1 Low PT Treatments $Self Care/Home Management: 8-22   PT G Codes:   PT G-Codes **NOT FOR INPATIENT CLASS** Functional Assessment Tool Used: AM-PAC 6 Clicks Basic Mobility;Clinical judgement Functional Limitation: Mobility: Walking and moving around Mobility: Walking and Moving Around Current Status (E3343): At least 1 percent but less than 20 percent impaired, limited or restricted Mobility: Walking and Moving Around Goal Status [phone]): At least 1 percent but less than 20 percent impaired, limited or restricted Mobility: Walking and Moving Around Discharge Status [phone]): At least 1 percent but less than 20 percent impaired, limited or restricted    Deniece Ree PT, DPT, CBIS  Supplemental Physical Therapist [facility]

## 2016-12-02 NOTE — Evaluation (Addendum)
Clinical/Bedside Swallow Evaluation Patient Details  Name: Steve Ramirez MRN: [id number] Date of Birth: 1935/06/10  Today's Date: 12/02/2016 Time: SLP Start Time (ACUTE ONLY): 1 SLP Stop Time (ACUTE ONLY): 1600 SLP Time Calculation (min) (ACUTE ONLY): 30 min  Past Medical History:  Past Medical History:  Diagnosis Date  . Coronary atherosclerosis of native coronary artery   . Essential hypertension, benign   . GIB (gastrointestinal bleeding)    Colonic diverticular bleed  . History of melanoma   . History of TIAs   . Type 2 diabetes mellitus ([place])    Past Surgical History:  Past Surgical History:  Procedure Laterality Date  . COLONOSCOPY  08/13/2010   Procedure: COLONOSCOPY;  Surgeon: Rogene Houston, MD;  Location: [facility];  Service: Endoscopy;  Laterality: N/A;  . KNEE SURGERY    . LEFT HEART CATHETERIZATION WITH CORONARY ANGIOGRAM N/A 11/24/2010   Procedure: LEFT HEART CATHETERIZATION WITH CORONARY ANGIOGRAM;  Surgeon: Josue Hector, MD;  Location: [facility] CATH LAB;  Service: Cardiovascular;  Laterality: N/A;   HPI:  Steve G Witherspoonis a 81 y.o.malewith medical history ofright pontine stroke with residual right lower extremity weakness, hypertension, coronary artery disease, diabetes mellitus,and diverticular GI bleed presented with slurred speech,dysphagiaand drooling that began around 8 AM on December 01, 2016. The patient denied any new right upper extremity or right lower extremity weakness, but stated that while he was walking on December 02, 2016, he kept drifting to his right. In addition, the patient states that his tongue feels "funny".NextHe denies any fevers, chills, chest pain, shortness of breath, coughing, hemoptysis, nausea, vomiting, diarrhea, abdominal pain, dysuria, hematuria. In the emergency department, BMP, CBC, LFTs were unremarkable. 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 CT of the  brain was negative for any acute findings. MRI of the brain was ordered and shows: Small acute lacunar infarcts in the left lenticulostriate artery territory. No associated hemorrhage or mass effect. The dominant infarct extends from the left corona radiata to the posterior lentiform. BSE requested and seen in the ED by SLP.   Assessment / Plan / Recommendation Clinical Impression  Clinical swallow evaluation completed while Pt in ED due to Pt wanting to leave AMA, but also with reports of swallow difficulty. Pt's wife and daughter present for the evaluation. Pt reports some loss of saliva and coffee on right side earlier today along with slurred speech. Oral motor examination reveals mild right facial asymmetry, reduced sensation, and decreased coordination. The above result in dysarthria and mild oral phase dysphagia with occasional labial spillage. Pt had one episode of overt coughing following graham crackers and sequential sips water over the course of 244ml. Recommend regular textures and thin liquids with aspiration precautions, Pt to take small sips. Pt and family educated on aspiration precautions and are agreeable to recommendations. Pt may need outpatient SLP services post discharge if dysarthria persists. Above to Dr. Carles Collet and RN.  SLP Visit Diagnosis: Dysphagia, oral phase (R13.11)    Aspiration Risk  Mild aspiration risk    Diet Recommendation Regular;Thin liquid   Liquid Administration via: Cup;Straw Medication Administration: Whole meds with liquid Supervision: Patient able to self feed Postural Changes: Seated upright at 90 degrees;Remain upright for at least 30 minutes after po intake    Other  Recommendations Oral Care Recommendations: Oral care BID Other Recommendations: Clarify dietary restrictions   Follow up Recommendations Outpatient SLP      Frequency and Duration min 2x/week  1 week       Prognosis Prognosis for Safe Diet Advancement: Good      Swallow Study    General Date of Onset: 12/02/16 HPI: Steve Cislo Witherspoonis a 81 y.o.malewith medical history ofright pontine stroke with residual right lower extremity weakness, hypertension, coronary artery disease, diabetes mellitus,and diverticular GI bleed presented with slurred speech,dysphagiaand drooling that began around 8 AM on December 01, 2016. The patient denied any new right upper extremity or right lower extremity weakness, but stated that while he was walking on December 02, 2016, he kept drifting to his right. In addition, the patient states that his tongue feels "funny".NextHe denies any fevers, chills, chest pain, shortness of breath, coughing, hemoptysis, nausea, vomiting, diarrhea, abdominal pain, dysuria, hematuria. In the emergency department, BMP, CBC, LFTs were unremarkable. 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 CT of the brain was negative for any acute findings. MRI of the brain was ordered and shows: Small acute lacunar infarcts in the left lenticulostriate artery territory. No associated hemorrhage or mass effect. The dominant infarct extends from the left corona radiata to the posterior lentiform. BSE requested and seen in the ED by SLP. Type of Study: Bedside Swallow Evaluation Previous Swallow Assessment: None on record Diet Prior to this Study: NPO Temperature Spikes Noted: No Respiratory Status: Room air History of Recent Intubation: No Behavior/Cognition: Alert;Cooperative;Pleasant mood Oral Cavity Assessment: Within Functional Limits Oral Care Completed by SLP: No Oral Cavity - Dentition: Adequate natural dentition Vision: Functional for self-feeding Self-Feeding Abilities: Able to feed self Patient Positioning: Upright in bed Baseline Vocal Quality: Normal Volitional Cough: Strong Volitional Swallow: Able to elicit    Oral/Motor/Sensory Function Overall Oral Motor/Sensory Function: Mild impairment Facial ROM:  Reduced right;Suspected CN VII (facial) dysfunction Facial Sensation: Reduced right Lingual ROM: Reduced right Lingual Symmetry: Within Functional Limits Lingual Strength: Within Functional Limits Lingual Sensation: Within Functional Limits Velum: Within Functional Limits Mandible: Within Functional Limits   Ice Chips Ice chips: Within functional limits Presentation: Spoon   Thin Liquid Thin Liquid: Impaired Presentation: Cup;Self Fed;Straw Oral Phase Impairments: Reduced labial seal Oral Phase Functional Implications: Right anterior spillage Pharyngeal  Phase Impairments: Cough - Immediate(x1 over 240 ml)    Nectar Thick Nectar Thick Liquid: Not tested   Honey Thick Honey Thick Liquid: Not tested   Puree Puree: Within functional limits Presentation: Self Fed;Spoon Other Comments: mild right labial residue   Solid    Addendum G-Codes:  Swallowing Clinical Judgement CI, CI, CI  Thank you,  Genene Churn, CCC-SLP [phone]    Solid: Within functional limits Presentation: Self Fed        Steve Ramirez 12/02/2016,4:13 PM

## 2016-12-03 ENCOUNTER — Emergency Department (HOSPITAL_COMMUNITY): Payer: MEDICARE

## 2016-12-03 ENCOUNTER — Encounter (HOSPITAL_COMMUNITY): Payer: Self-pay | Admitting: Emergency Medicine

## 2016-12-03 ENCOUNTER — Inpatient Hospital Stay (HOSPITAL_COMMUNITY): Payer: MEDICARE

## 2016-12-03 ENCOUNTER — Inpatient Hospital Stay (HOSPITAL_COMMUNITY)
Admission: EM | Admit: 2016-12-03 | Discharge: 2016-12-05 | Disposition: A | Discharge status: Home / Self Care | Payer: MEDICARE | Source: Home / Self Care | Attending: Internal Medicine | Admitting: Internal Medicine

## 2016-12-03 DIAGNOSIS — G8190 Hemiplegia, unspecified affecting unspecified side: Secondary | ICD-10-CM | POA: Diagnosis not present

## 2016-12-03 DIAGNOSIS — R55 Syncope and collapse: Secondary | ICD-10-CM | POA: Diagnosis not present

## 2016-12-03 DIAGNOSIS — I10 Essential (primary) hypertension: Secondary | ICD-10-CM | POA: Diagnosis not present

## 2016-12-03 DIAGNOSIS — I6789 Other cerebrovascular disease: Secondary | ICD-10-CM | POA: Diagnosis not present

## 2016-12-03 DIAGNOSIS — G8191 Hemiplegia, unspecified affecting right dominant side: Secondary | ICD-10-CM | POA: Diagnosis not present

## 2016-12-03 DIAGNOSIS — I639 Cerebral infarction, unspecified: Secondary | ICD-10-CM | POA: Diagnosis not present

## 2016-12-03 DIAGNOSIS — E785 Hyperlipidemia, unspecified: Secondary | ICD-10-CM | POA: Diagnosis present

## 2016-12-03 DIAGNOSIS — R4020 Unspecified coma: Secondary | ICD-10-CM | POA: Diagnosis not present

## 2016-12-03 DIAGNOSIS — R4781 Slurred speech: Secondary | ICD-10-CM | POA: Diagnosis not present

## 2016-12-03 DIAGNOSIS — E119 Type 2 diabetes mellitus without complications: Secondary | ICD-10-CM

## 2016-12-03 DIAGNOSIS — I6350 Cerebral infarction due to unspecified occlusion or stenosis of unspecified cerebral artery: Secondary | ICD-10-CM | POA: Diagnosis not present

## 2016-12-03 DIAGNOSIS — R1312 Dysphagia, oropharyngeal phase: Secondary | ICD-10-CM | POA: Diagnosis not present

## 2016-12-03 HISTORY — DX: Cerebral infarction, unspecified: I63.9

## 2016-12-03 LAB — COMPREHENSIVE METABOLIC PANEL
ALBUMIN: 4.5 g/dL (ref 3.5–5.0)
ALT: 31 U/L (ref 17–63)
ANION GAP: 10 (ref 5–15)
AST: 25 U/L (ref 15–41)
Alkaline Phosphatase: 41 U/L (ref 38–126)
BUN: 25 mg/dL — AB (ref 6–20)
CHLORIDE: 104 mmol/L (ref 101–111)
CO2: 24 mmol/L (ref 22–32)
Calcium: 9.7 mg/dL (ref 8.9–10.3)
Creatinine, Ser: 1.28 mg/dL — ABNORMAL HIGH (ref 0.61–1.24)
GFR calc Af Amer: 60 mL/min — ABNORMAL LOW (ref >60)
GFR calc non Af Amer: 52 mL/min — ABNORMAL LOW (ref >60)
GLUCOSE: 168 mg/dL — AB (ref 65–99)
POTASSIUM: 4 mmol/L (ref 3.5–5.1)
SODIUM: 138 mmol/L (ref 135–145)
Total Bilirubin: 0.9 mg/dL (ref 0.3–1.2)
Total Protein: 7.4 g/dL (ref 6.5–8.1)

## 2016-12-03 LAB — CBC WITH DIFFERENTIAL/PLATELET
BASOS ABS: 0 10*3/uL (ref 0.0–0.1)
BASOS PCT: 0 %
EOS ABS: 0.1 10*3/uL (ref 0.0–0.7)
EOS PCT: 2 %
HCT: 42.2 % (ref 39.0–52.0)
Hemoglobin: 13.8 g/dL (ref 13.0–17.0)
Lymphocytes Relative: 22 %
Lymphs Abs: 1 10*3/uL (ref 0.7–4.0)
MCH: 27.1 pg (ref 26.0–34.0)
MCHC: 32.7 g/dL (ref 30.0–36.0)
MCV: 82.9 fL (ref 78.0–100.0)
MONO ABS: 0.3 10*3/uL (ref 0.1–1.0)
Monocytes Relative: 8 %
NEUTROS ABS: 3 10*3/uL (ref 1.7–7.7)
Neutrophils Relative %: 68 %
PLATELETS: 247 10*3/uL (ref 150–400)
RBC: 5.09 MIL/uL (ref 4.22–5.81)
RDW: 14 % (ref 11.5–15.5)
WBC: 4.5 10*3/uL (ref 4.0–10.5)

## 2016-12-03 LAB — BASIC METABOLIC PANEL
ANION GAP: 8 (ref 5–15)
BUN: 22 mg/dL — ABNORMAL HIGH (ref 6–20)
CO2: 26 mmol/L (ref 22–32)
Calcium: 9.6 mg/dL (ref 8.9–10.3)
Chloride: 103 mmol/L (ref 101–111)
Creatinine, Ser: 1.02 mg/dL (ref 0.61–1.24)
GFR calc Af Amer: >60 mL/min (ref >60)
GLUCOSE: 132 mg/dL — AB (ref 65–99)
POTASSIUM: 3.5 mmol/L (ref 3.5–5.1)
Sodium: 137 mmol/L (ref 135–145)

## 2016-12-03 LAB — LIPID PANEL
CHOLESTEROL: 130 mg/dL (ref 0–200)
HDL: 24 mg/dL — ABNORMAL LOW (ref >40)
LDL CALC: 49 mg/dL (ref 0–99)
TRIGLYCERIDES: 286 mg/dL — AB (ref <150)
Total CHOL/HDL Ratio: 5.4 RATIO
VLDL: 57 mg/dL — ABNORMAL HIGH (ref 0–40)

## 2016-12-03 LAB — TROPONIN I
Troponin I: <0.03 ng/mL (ref <0.03)
Troponin I: <0.03 ng/mL (ref <0.03)
Troponin I: <0.03 ng/mL (ref <0.03)

## 2016-12-03 LAB — HEMOGLOBIN A1C
HEMOGLOBIN A1C: 6.9 % — AB (ref 4.8–5.6)
Mean Plasma Glucose: 151.33 mg/dL

## 2016-12-03 LAB — GLUCOSE, CAPILLARY
GLUCOSE-CAPILLARY: 144 mg/dL — AB (ref 65–99)
Glucose-Capillary: 116 mg/dL — ABNORMAL HIGH (ref 65–99)

## 2016-12-03 LAB — CBG MONITORING, ED: GLUCOSE-CAPILLARY: 157 mg/dL — AB (ref 65–99)

## 2016-12-03 MED ORDER — IOPAMIDOL (ISOVUE-370) INJECTION 76%
100.0000 mL | Freq: Once | INTRAVENOUS | Status: AC | PRN
  Administered 2016-12-03: 100 mL via INTRAVENOUS

## 2016-12-03 MED ORDER — ASPIRIN 81 MG PO CHEW
81.0000 mg | CHEWABLE_TABLET | Freq: Every day | ORAL | Status: DC
  Administered 2016-12-04 – 2016-12-05 (×2): 81 mg via ORAL
  Filled 2016-12-03 (×3): qty 1

## 2016-12-03 MED ORDER — SODIUM CHLORIDE 0.9 % IV SOLN
INTRAVENOUS | Status: DC
  Administered 2016-12-03 – 2016-12-04 (×2): via INTRAVENOUS

## 2016-12-03 MED ORDER — ACETAMINOPHEN 650 MG RE SUPP: 650.0000 mg | RECTAL | Status: DC | PRN

## 2016-12-03 MED ORDER — PRAVASTATIN SODIUM 10 MG PO TABS
10.0000 mg | ORAL_TABLET | Freq: Every day | ORAL | Status: DC
  Administered 2016-12-03 – 2016-12-05 (×3): 10 mg via ORAL
  Filled 2016-12-03 (×3): qty 1

## 2016-12-03 MED ORDER — ROPINIROLE HCL 0.25 MG PO TABS
0.5000 mg | ORAL_TABLET | Freq: Two times a day (BID) | ORAL | Status: DC
  Administered 2016-12-03 – 2016-12-05 (×4): 0.5 mg via ORAL
  Filled 2016-12-03 (×4): qty 2

## 2016-12-03 MED ORDER — METOPROLOL SUCCINATE ER 50 MG PO TB24
50.0000 mg | ORAL_TABLET | Freq: Every day | ORAL | Status: DC
  Administered 2016-12-04 – 2016-12-05 (×2): 50 mg via ORAL
  Filled 2016-12-03 (×2): qty 1

## 2016-12-03 MED ORDER — LEVOTHYROXINE SODIUM 50 MCG PO TABS
50.0000 ug | ORAL_TABLET | Freq: Every day | ORAL | Status: DC
  Administered 2016-12-04 – 2016-12-05 (×2): 50 ug via ORAL
  Filled 2016-12-03 (×2): qty 1

## 2016-12-03 MED ORDER — FENOFIBRATE 54 MG PO TABS
54.0000 mg | ORAL_TABLET | Freq: Every day | ORAL | Status: DC
  Administered 2016-12-04 – 2016-12-05 (×2): 54 mg via ORAL
  Filled 2016-12-03 (×4): qty 1

## 2016-12-03 MED ORDER — STROKE: EARLY STAGES OF RECOVERY BOOK
Freq: Once | Status: DC
  Filled 2016-12-03: qty 1

## 2016-12-03 MED ORDER — INSULIN ASPART 100 UNIT/ML ~~LOC~~ SOLN
0.0000 [IU] | Freq: Three times a day (TID) | SUBCUTANEOUS | Status: DC
  Administered 2016-12-04 – 2016-12-05 (×2): 2 [IU] via SUBCUTANEOUS

## 2016-12-03 MED ORDER — ENOXAPARIN SODIUM 40 MG/0.4ML ~~LOC~~ SOLN
40.0000 mg | SUBCUTANEOUS | Status: DC
  Administered 2016-12-03 – 2016-12-04 (×2): 40 mg via SUBCUTANEOUS
  Filled 2016-12-03 (×2): qty 0.4

## 2016-12-03 MED ORDER — CLOPIDOGREL BISULFATE 75 MG PO TABS
75.0000 mg | ORAL_TABLET | Freq: Every day | ORAL | Status: DC
  Administered 2016-12-04 – 2016-12-05 (×2): 75 mg via ORAL
  Filled 2016-12-03 (×2): qty 1

## 2016-12-03 MED ORDER — ASPIRIN 81 MG PO TBEC: 81.0000 mg | DELAYED_RELEASE_TABLET | Freq: Every day | ORAL | Status: DC

## 2016-12-03 MED ORDER — ACETAMINOPHEN 325 MG PO TABS: 650.0000 mg | ORAL_TABLET | ORAL | Status: DC | PRN

## 2016-12-03 MED ORDER — HEPARIN SODIUM (PORCINE) 5000 UNIT/ML IJ SOLN: 5000.0000 [IU] | Freq: Three times a day (TID) | INTRAMUSCULAR | Status: DC

## 2016-12-03 MED ORDER — ACETAMINOPHEN 160 MG/5ML PO SOLN: 650.0000 mg | ORAL | Status: DC | PRN

## 2016-12-03 MED ORDER — SODIUM CHLORIDE 0.9 % IV BOLUS (SEPSIS)
1000.0000 mL | Freq: Once | INTRAVENOUS | Status: AC
  Administered 2016-12-03: 1000 mL via INTRAVENOUS

## 2016-12-03 NOTE — ED Notes (Signed)
Patient to CT at this time

## 2016-12-03 NOTE — ED Notes (Signed)
Patient informed Probation officer he does not have pacemaker.

## 2016-12-03 NOTE — Plan of Care (Addendum)
Bedside shift report completed. Pt refuses bed alarm. Educated patient on safety risks and he still refuses.

## 2016-12-03 NOTE — Progress Notes (Signed)
Spoke w/CM at [phone].  Ok to discharge.

## 2016-12-03 NOTE — H&P (Signed)
[facility] History and Physical  Steve Ramirez HEN:[id number] DOB: 20-Oct-1935 DOA: 12/03/2016  Referring physician:   PCP: Sharilyn Sites, MD   Chief Complaint: NEAR SYNCOPE   HPI:   81 y.o.malewith medical history ofright pontine stroke with residual right lower extremity weakness, hypertension, coronary artery disease, diabetes mellitus,and diverticular GI bleed presented with slurred speech,dysphagiaand drooling that began around 8 AM on December 01, 2016. The patient denied any new right upper extremity or right lower extremity weakness, but stated that while he was walking on December 02, 2016, he kept drifting to his right. In addition, the patient states that his dysarthria worsened on the morning of December 02, 2016.The patient also has some drooling on the right side of his mouth with worsening dysphagia type symptoms. As a result, the patient presented to emergency department for further evaluation. The patient's deficits remained stable/improved after overnight observation.  The patient did not want any further workup during hospitalization including echo and CTA and was discharged earlier today . After patient was discharged around 11:30 when the patient started on the dining table to eat, family noticed the patient slumped over to the right, started drooling with food in his mouth. He was semi-conscious for about 10-15 minutes, would intermittently chew his food but would not wake up. Son called EMS. Son is not sure that the patient had low blood pressure during this episode  ED course Patient underwent blood pressure in the 36R to 443 systolic, creatinine has increased from 1.0-1.28. Patient does not recall taking any medications other than what was advised on his discharge paperwork. He denied any chest pain shortness of breath palpitations during this episode. He does remember feeling dizzy and lightheaded prior to the episode. Patient also felt cold clammy and  sweaty during the episode. Patient is being admitted for further evaluation and complete stroke workup. I did discuss patient's entire chart, imaging studies after [place], informed him that we do not have neurology support until Tuesday. He advised to admit the patient to [facility] and transfer if needed.  Review of Systems: negative for the following   Complete review of systems was done with pertinent positives documented in history of present illness     Past Medical History:  Diagnosis Date  . Coronary atherosclerosis of native coronary artery   . Essential hypertension, benign   . GIB (gastrointestinal bleeding)    Colonic diverticular bleed  . History of melanoma   . History of TIAs   . Stroke ([place])   . Type 2 diabetes mellitus ([place])      Past Surgical History:  Procedure Laterality Date  . COLONOSCOPY  08/13/2010   Procedure: COLONOSCOPY;  Surgeon: Rogene Houston, MD;  Location: [facility];  Service: Endoscopy;  Laterality: N/A;  . KNEE SURGERY    . LEFT HEART CATHETERIZATION WITH CORONARY ANGIOGRAM N/A 11/24/2010   Procedure: LEFT HEART CATHETERIZATION WITH CORONARY ANGIOGRAM;  Surgeon: Josue Hector, MD;  Location: [facility] CATH LAB;  Service: Cardiovascular;  Laterality: N/A;      Social History:  reports that he quit smoking about 36 years ago. His smoking use included cigarettes. he has never used smokeless tobacco. He reports that he does not drink alcohol or use drugs.    No Known Allergies  Family History  Problem Relation Age of Onset  . Coronary artery disease Father   . Coronary artery disease Brother   . Asthma Unknown         Prior to  Admission medications   Medication Sig Start Date End Date Taking? Authorizing Provider  aspirin EC 81 MG EC tablet Take 1 tablet (81 mg total) by mouth daily. 12/04/16  Yes Tat, Shanon Brow, MD  cetirizine (ZYRTEC) 10 MG tablet Take 10 mg by mouth daily.   Yes [provider]  clopidogrel (PLAVIX) 75 MG tablet  Take 1 tablet (75 mg total) by mouth daily. 08/08/12  Yes Lendon Colonel, NP  fenofibrate (TRICOR) 145 MG tablet Take 145 mg by mouth daily.   Yes [provider]  isosorbide mononitrate (IMDUR) 30 MG 24 hr tablet Take 1 tablet by mouth daily. 11/30/16  Yes [provider]  levothyroxine (SYNTHROID, LEVOTHROID) 50 MCG tablet Take 1 tablet by mouth daily. 11/13/16  Yes [provider]  lisinopril-hydrochlorothiazide (PRINZIDE,ZESTORETIC) 20-12.5 MG per tablet Take 1 tablet by mouth 2 (two) times daily.   Yes [provider]  metFORMIN (GLUCOPHAGE) 500 MG tablet Take 500 mg by mouth 2 (two) times daily.   Yes [provider]  metoprolol succinate (TOPROL-XL) 50 MG 24 hr tablet Take 50 mg by mouth daily. Take with or immediately following a meal.   Yes [provider]  Multiple Vitamin (MULTIVITAMIN) tablet Take 1 tablet by mouth daily.     Yes [provider]  nitroGLYCERIN (NITROSTAT) 0.4 MG SL tablet Place 0.4 mg under the tongue every 5 (five) minutes as needed for chest pain.   Yes [provider]  Omega-3 Fatty Acids (OMEGA 3 PO) Take 1,000 mg by mouth daily.    Yes [provider]  pravastatin (PRAVACHOL) 10 MG tablet Take 1 tablet by mouth daily. 09/04/16  Yes [provider]  rOPINIRole (REQUIP) 0.5 MG tablet Take 1 tablet by mouth 2 (two) times daily. 09/15/16  Yes [provider]  Saw Palmetto 450 MG CAPS Take 1 capsule by mouth daily.   Yes [provider]  spironolactone (ALDACTONE) 25 MG tablet Take 1 tablet by mouth daily. 09/28/16  Yes [provider]  tamsulosin (FLOMAX) 0.4 MG CAPS capsule Take 0.4 mg by mouth daily.   Yes [provider]     Physical Exam: Vitals:   12/03/16 1330 12/03/16 1400 12/03/16 1416 12/03/16 1500  BP: 114/69 93/64  120/72  Pulse: 87 87 86 93  Resp: (!) 25 13 13 14   SpO2: 98% 97% 95% 96%  Weight:      Height:             Vitals:   12/03/16 1330 12/03/16 1400 12/03/16 1416 12/03/16 1500  BP: 114/69 93/64  120/72  Pulse: 87 87 86 93  Resp: (!) 25 13 13 14   SpO2: 98% 97% 95% 96%  Weight:      Height:       Constitutional: NAD, calm, comfortable Eyes: PERRL, lids and conjunctivae normal ENMT: Mucous membranes are moist. Posterior pharynx clear of any exudate or lesions.Normal dentition.  Neck: normal, supple, no masses, no thyromegaly Respiratory: clear to auscultation bilaterally, no wheezing, no crackles. Normal respiratory effort. No accessory muscle use.  Cardiovascular: Regular rate and rhythm, no murmurs / rubs / gallops. No extremity edema. 2+ pedal pulses. No carotid bruits.  Abdomen: no tenderness, no masses palpated. No hepatosplenomegaly. Bowel sounds positive.  Musculoskeletal: no clubbing / cyanosis. No joint deformity upper and lower extremities. Good ROM, no contractures. Normal muscle tone.  Skin: no rashes, lesions, ulcers. No induration Neuro:  CN II-XII intact, strength 4/5 in RUE, RLE, strength 4/5 LUE,  LLE; sensation intact bilateral; no dysmetria; babinski equivocal Psychiatric: Normal judgment and insight. Alert and oriented x 3. Normal mood.     Labs on Admission: I have personally reviewed following labs and imaging studies  CBC: Recent Labs  Lab 12/02/16 1133 12/02/16 1136 12/03/16 1331  WBC 5.3  --  4.5  NEUTROABS 2.8  --  3.0  HGB 14.0 14.6 13.8  HCT 44.0 43.0 42.2  MCV 83.3  --  82.9  PLT 293  --  993    Basic Metabolic Panel: Recent Labs  Lab 12/02/16 1133 12/02/16 1136 12/03/16 0654 12/03/16 1331  NA 137 141 137 138  K 3.6 3.7 3.5 4.0  CL 102 104 103 104  CO2 27  --  26 24  GLUCOSE 105* 102* 132* 168*  BUN 24* 24* 22* 25*  CREATININE 1.02 1.00 1.02 1.28*  CALCIUM 10.4*  --  9.6 9.7    GFR: Estimated Creatinine Clearance: 49.8 mL/min (A) (by C-G formula based on SCr of 1.28 mg/dL (H)).  Liver Function Tests: Recent Labs  Lab 12/02/16 1133  12/03/16 1331  AST 28 25  ALT 33 31  ALKPHOS 43 41  BILITOT 1.0 0.9  PROT 8.0 7.4  ALBUMIN 4.8 4.5   No results for input(s): LIPASE, AMYLASE in the last 168 hours. No results for input(s): AMMONIA in the last 168 hours.  Coagulation Profile: Recent Labs  Lab 12/02/16 1133  INR 1.05   No results for input(s): DDIMER in the last 72 hours.  Cardiac Enzymes: Recent Labs  Lab 12/03/16 1331  TROPONINI <0.03    BNP (last 3 results) No results for input(s): PROBNP in the last 8760 hours.  HbA1C: Recent Labs    12/03/16 0654  HGBA1C 6.9*   Lab Results  Component Value Date   HGBA1C 6.9 (H) 12/03/2016   HGBA1C 7.1 (H) 09/14/2015   HGBA1C 6.8 (H) 11/29/2014     CBG: Recent Labs  Lab 12/02/16 2256 12/03/16 0735 12/03/16 1309  GLUCAP 107* 116* 157*    Lipid Profile: Recent Labs    12/03/16 0654  CHOL 130  HDL 24*  LDLCALC 49  TRIG 286*  CHOLHDL 5.4    Thyroid Function Tests: No results for input(s): TSH, T4TOTAL, FREET4, T3FREE, THYROIDAB in the last 72 hours.  Anemia Panel: No results for input(s): VITAMINB12, FOLATE, FERRITIN, TIBC, IRON, RETICCTPCT in the last 72 hours.  Urine analysis:    Component Value Date/Time   COLORURINE YELLOW 09/13/2015 0906   APPEARANCEUR CLEAR 09/13/2015 0906   LABSPEC 1.010 09/13/2015 0906   PHURINE 6.5 09/13/2015 0906   GLUCOSEU NEGATIVE 09/13/2015 0906   HGBUR NEGATIVE 09/13/2015 0906   BILIRUBINUR NEGATIVE 09/13/2015 0906   KETONESUR NEGATIVE 09/13/2015 0906   PROTEINUR NEGATIVE 09/13/2015 0906   UROBILINOGEN 0.2 04/15/2010 1425   NITRITE NEGATIVE 09/13/2015 0906   LEUKOCYTESUR NEGATIVE 09/13/2015 0906    Sepsis Labs: @LABRCNTIP (procalcitonin:4,lacticidven:4) )No results found for this or any previous visit (from the past 240 hour(s)).       Radiological Exams on Admission: Dg Chest 1 View  Result Date: 12/03/2016 CLINICAL DATA:  Syncope. EXAM: CHEST 1 VIEW COMPARISON:  Chest x-ray 11/22/2010  FINDINGS: The cardiac silhouette, mediastinal and hilar contours are within normal limits given the AP projection and portable technique. Low lung volumes with mild vascular crowding and streaky basilar atelectasis but no infiltrates, edema or effusions. The bony thorax is intact. IMPRESSION: No acute cardiopulmonary findings. Electronically Signed   By: Ricky Stabs.D.  On: 12/03/2016 14:02   Ct Head Wo Contrast  Result Date: 12/03/2016 CLINICAL DATA:  Altered level of consciousness. Passed out during lunch. EXAM: CT HEAD WITHOUT CONTRAST TECHNIQUE: Contiguous axial images were obtained from the base of the skull through the vertex without intravenous contrast. COMPARISON:  Brain MRI from yesterday FINDINGS: Brain: Known acute infarct in the left basal ganglia and corona radiata. Remote lacunar infarct in the left thalamus. Confluent low-density in the cerebral white matter from chronic small vessel ischemia. No hemorrhage or new infarct. No hydrocephalus or masslike finding. Vascular: Atherosclerotic calcification.  No hyperdense vessel. Skull: No acute or aggressive finding. Small right parietal subgaleal lipoma. Sinuses/Orbits: Negative IMPRESSION: 1. No acute finding when compared to yesterday. 2. Known acute perforator infarct in the left basal ganglia. 3. Extensive chronic small vessel ischemic injury. Electronically Signed   By: Monte Fantasia M.D.   On: 12/03/2016 14:05   Ct Head Wo Contrast  Result Date: 12/02/2016 CLINICAL DATA:  Slurred speech and altered mental status. Right-sided weakness. Dizziness. EXAM: CT HEAD WITHOUT CONTRAST TECHNIQUE: Contiguous axial images were obtained from the base of the skull through the vertex without intravenous contrast. COMPARISON:  Head CT and brain MRI September 13, 2015 FINDINGS: Brain: There is mild diffuse atrophy. There is no intracranial mass, hemorrhage, extra-axial fluid collection, or midline shift. There is patchy small vessel disease  throughout the centra semiovale bilaterally. There is a prior small infarct in the anterior left thalamus as well as a nearby small infarct in the posterior left lentiform nucleus. Small vessel disease is noted in the external capsule on the left. There is small vessel disease with prior small infarct in the lateral right upper pons. There is no new gray-white compartment lesion. No acute infarct is demonstrable on this study. Vascular: There is no appreciable hyperdense vessel. There is calcification in each carotid siphon region as well as in the distal left vertebral artery. Skull: Bony calvarium appears intact. Sinuses/Orbits: There is mucosal thickening in multiple ethmoid air cells. There is mucosal thickening in the right maxillary antrum. Other visualized paranasal sinuses are clear. Visualized orbits appear symmetric bilaterally. Other: Mastoid air cells are clear. IMPRESSION: Atrophy with extensive supratentorial small vessel disease. Prior small infarcts noted in the anterior left thalamus, posterior left lentiform nucleus, left external capsule, and upper right pons. No acute infarct demonstrable on this study. No intracranial mass, hemorrhage, or extra-axial fluid collection. There are foci of arterial vascular calcification. Foci of paranasal sinus disease noted. Electronically Signed   By: Lowella Grip III M.D.   On: 12/02/2016 12:12   Mr Virgel Paling ZO Contrast  Result Date: 12/02/2016 CLINICAL DATA:  81 year old male with slurred speech and weakness since 1000 hours yesterday. Dizziness, drooling and choking. EXAM: MRI HEAD WITHOUT CONTRAST MRA HEAD WITHOUT CONTRAST TECHNIQUE: Multiplanar, multiecho pulse sequences of the brain and surrounding structures were obtained without intravenous contrast. Angiographic images of the head were obtained using MRA technique without contrast. COMPARISON:  Head CT without contrast 1158 hours today. Brain MRI and intracranial MRA 09/13/2015. FINDINGS: MRI  HEAD FINDINGS Brain: 20 mm curvilinear area of restricted diffusion tracking from the left corona radiata into the posterior left lentiform (Series 6, image 51). Associated mild T2 and FLAIR hyperintensity. There is a separate small area of restricted diffusion along the floor of the left lentiform on series 6, image 54. No associated hemorrhage or mass effect. No other restricted diffusion. Underlying multifocal chronic lacunar infarcts in the bilateral deep gray matter  nuclei, the right lateral pons, the left cerebellum, and bilateral cerebral white matter with chronic confluent bilateral white matter T2 and FLAIR hyperintensity. Outside of the acute findings gray and white matter signal is stable. No chronic cerebral blood products identified. No midline shift, mass effect, evidence of mass lesion, ventriculomegaly, extra-axial collection or acute intracranial hemorrhage. Cervicomedullary junction and pituitary are within normal limits. Vascular: Major intracranial vascular flow voids stable with generalized intracranial artery dolichoectasia. Skull and upper cervical spine: Negative. Visualized bone marrow signal is within normal limits. Sinuses/Orbits: Leftward gaze deviation. Otherwise negative orbits soft tissues. Mild paranasal sinus mucosal thickening is stable. Other: Trace mastoid fluid on the left is increased but appears inconsequential. Visible internal auditory structures appear normal. Scalp and face soft tissues appear negative. MRA HEAD FINDINGS Dominant distal left vertebral artery is stable since 2017 with tortuosity and irregularity but no significant stenosis. Diminutive non dominant distal right vertebral artery is highly irregular and may be occluded proximal to the right PICA origin. The distal right V4 segment to the right PICA may be supplied in a retrograde fashion. Stable basilar artery with irregularity but no significant stenosis. SCA and PCA origins remain patent and within normal  limits. Moderate to severe chronic right P2 segment irregularity and stenosis. Tortuous left P1 segment. Stable PCAs. Stable antegrade flow in both ICA siphons. Bilateral siphon irregularity without significant stenosis. Posterior communicating arteries are diminutive or absent. Patent carotid termini. Stable MCA and ACA origins. The right A1 is dominant. Moderate chronic stenosis at the left ACA origin is stable. Visible ACA branches are within normal limits. Left MCA M1 segment and left MCA bifurcation remain patent with mild irregularity but no stenosis. Increased moderate stenosis in a posterior left MCA M2 branch on series 109, image 10. Stable right MCA and right MCA bifurcation where there is mild irregularity and stenosis. Stable right MCA branches. IMPRESSION: 1. Small acute lacunar infarcts in the left lenticulostriate artery territory. No associated hemorrhage or mass effect. The dominant infarct extends from the left corona radiata to the posterior lentiform. 2. Underlying severe chronic small vessel ischemia is otherwise stable since 2017. 3. 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 - progressed left MCA M2 branch stenosis, now moderate. - moderate to severe chronic stenosis of the right PCA. - chronic mild to moderate stenosis at the left ACA origin and right MCA bifurcation. Electronically Signed   By: Genevie Ann M.D.   On: 12/02/2016 14:33   Mr Brain Wo Contrast  Result Date: 12/02/2016 CLINICAL DATA:  81 year old male with slurred speech and weakness since 1000 hours yesterday. Dizziness, drooling and choking. EXAM: MRI HEAD WITHOUT CONTRAST MRA HEAD WITHOUT CONTRAST TECHNIQUE: Multiplanar, multiecho pulse sequences of the brain and surrounding structures were obtained without intravenous contrast. Angiographic images of the head were obtained using  MRA technique without contrast. COMPARISON:  Head CT without contrast 1158 hours today. Brain MRI and intracranial MRA 09/13/2015. FINDINGS: MRI HEAD FINDINGS Brain: 20 mm curvilinear area of restricted diffusion tracking from the left corona radiata into the posterior left lentiform (Series 6, image 51). Associated mild T2 and FLAIR hyperintensity. There is a separate small area of restricted diffusion along the floor of the left lentiform on series 6, image 54. No associated hemorrhage or mass effect. No other restricted diffusion. Underlying multifocal chronic lacunar infarcts in the bilateral deep gray matter nuclei, the right lateral  pons, the left cerebellum, and bilateral cerebral white matter with chronic confluent bilateral white matter T2 and FLAIR hyperintensity. Outside of the acute findings gray and white matter signal is stable. No chronic cerebral blood products identified. No midline shift, mass effect, evidence of mass lesion, ventriculomegaly, extra-axial collection or acute intracranial hemorrhage. Cervicomedullary junction and pituitary are within normal limits. Vascular: Major intracranial vascular flow voids stable with generalized intracranial artery dolichoectasia. Skull and upper cervical spine: Negative. Visualized bone marrow signal is within normal limits. Sinuses/Orbits: Leftward gaze deviation. Otherwise negative orbits soft tissues. Mild paranasal sinus mucosal thickening is stable. Other: Trace mastoid fluid on the left is increased but appears inconsequential. Visible internal auditory structures appear normal. Scalp and face soft tissues appear negative. MRA HEAD FINDINGS Dominant distal left vertebral artery is stable since 2017 with tortuosity and irregularity but no significant stenosis. Diminutive non dominant distal right vertebral artery is highly irregular and may be occluded proximal to the right PICA origin. The distal right V4 segment to the right PICA may be supplied in a  retrograde fashion. Stable basilar artery with irregularity but no significant stenosis. SCA and PCA origins remain patent and within normal limits. Moderate to severe chronic right P2 segment irregularity and stenosis. Tortuous left P1 segment. Stable PCAs. Stable antegrade flow in both ICA siphons. Bilateral siphon irregularity without significant stenosis. Posterior communicating arteries are diminutive or absent. Patent carotid termini. Stable MCA and ACA origins. The right A1 is dominant. Moderate chronic stenosis at the left ACA origin is stable. Visible ACA branches are within normal limits. Left MCA M1 segment and left MCA bifurcation remain patent with mild irregularity but no stenosis. Increased moderate stenosis in a posterior left MCA M2 branch on series 109, image 10. Stable right MCA and right MCA bifurcation where there is mild irregularity and stenosis. Stable right MCA branches. IMPRESSION: 1. Small acute lacunar infarcts in the left lenticulostriate artery territory. No associated hemorrhage or mass effect. The dominant infarct extends from the left corona radiata to the posterior lentiform. 2. Underlying severe chronic small vessel ischemia is otherwise stable since 2017. 3. 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 - progressed left MCA M2 branch stenosis, now moderate. - moderate to severe chronic stenosis of the right PCA. - chronic mild to moderate stenosis at the left ACA origin and right MCA bifurcation. Electronically Signed   By: Genevie Ann M.D.   On: 12/02/2016 14:33   US Carotid Bilateral (at [facility] Only)  Result Date: 12/02/2016 CLINICAL DATA:  Stroke. EXAM: BILATERAL CAROTID DUPLEX ULTRASOUND TECHNIQUE: Pearline Cables scale imaging, color Doppler and duplex ultrasound were performed of bilateral carotid and vertebral arteries in the neck.  COMPARISON:  09/13/2015 FINDINGS: Criteria: Quantification of carotid stenosis is based on velocity parameters that correlate the residual internal carotid diameter with NASCET-based stenosis levels, using the diameter of the distal internal carotid lumen as the denominator for stenosis measurement. The following velocity measurements were obtained: RIGHT ICA:  169/23 cm/sec CCA:  31/54 cm/sec SYSTOLIC ICA/CCA RATIO:  2.1 DIASTOLIC ICA/CCA RATIO:  1.5 ECA:  121 cm/sec LEFT ICA:  104/38 cm/sec CCA:  00/86 cm/sec SYSTOLIC ICA/CCA RATIO:  1.1 DIASTOLIC ICA/CCA RATIO:  2.4 ECA:  149 cm/sec RIGHT CAROTID ARTERY: Diffuse atheromatous wall thickening of the common carotid. Calcified and noncalcified plaque at the bifurcation. Low resistance waveform. RIGHT VERTEBRAL ARTERY:  Antegrade flow. LEFT  CAROTID ARTERY: Diffuse atheromatous wall thickening of the common carotid. Predominately calcified moderate to bulky plaque at the bifurcation. LEFT VERTEBRAL ARTERY:  Antegrade flow. IMPRESSION: 1. Diffuse atherosclerotic plaque in the cervical carotids. 2. Elevated right ICA velocities in the lower end of the 50-69% stenotic range. 3. Less than 50% left ICA stenosis. 4. Antegrade flow in both vertebral arteries. Electronically Signed   By: Monte Fantasia M.D.   On: 12/02/2016 19:08   Dg Chest 1 View  Result Date: 12/03/2016 CLINICAL DATA:  Syncope. EXAM: CHEST 1 VIEW COMPARISON:  Chest x-ray 11/22/2010 FINDINGS: The cardiac silhouette, mediastinal and hilar contours are within normal limits given the AP projection and portable technique. Low lung volumes with mild vascular crowding and streaky basilar atelectasis but no infiltrates, edema or effusions. The bony thorax is intact. IMPRESSION: No acute cardiopulmonary findings. Electronically Signed   By: Marijo Sanes M.D.   On: 12/03/2016 14:02   Ct Head Wo Contrast  Result Date: 12/03/2016 CLINICAL DATA:  Altered level of consciousness. Passed out during lunch. EXAM:  CT HEAD WITHOUT CONTRAST TECHNIQUE: Contiguous axial images were obtained from the base of the skull through the vertex without intravenous contrast. COMPARISON:  Brain MRI from yesterday FINDINGS: Brain: Known acute infarct in the left basal ganglia and corona radiata. Remote lacunar infarct in the left thalamus. Confluent low-density in the cerebral white matter from chronic small vessel ischemia. No hemorrhage or new infarct. No hydrocephalus or masslike finding. Vascular: Atherosclerotic calcification.  No hyperdense vessel. Skull: No acute or aggressive finding. Small right parietal subgaleal lipoma. Sinuses/Orbits: Negative IMPRESSION: 1. No acute finding when compared to yesterday. 2. Known acute perforator infarct in the left basal ganglia. 3. Extensive chronic small vessel ischemic injury. Electronically Signed   By: Monte Fantasia M.D.   On: 12/03/2016 14:05   Ct Head Wo Contrast  Result Date: 12/02/2016 CLINICAL DATA:  Slurred speech and altered mental status. Right-sided weakness. Dizziness. EXAM: CT HEAD WITHOUT CONTRAST TECHNIQUE: Contiguous axial images were obtained from the base of the skull through the vertex without intravenous contrast. COMPARISON:  Head CT and brain MRI September 13, 2015 FINDINGS: Brain: There is mild diffuse atrophy. There is no intracranial mass, hemorrhage, extra-axial fluid collection, or midline shift. There is patchy small vessel disease throughout the centra semiovale bilaterally. There is a prior small infarct in the anterior left thalamus as well as a nearby small infarct in the posterior left lentiform nucleus. Small vessel disease is noted in the external capsule on the left. There is small vessel disease with prior small infarct in the lateral right upper pons. There is no new gray-white compartment lesion. No acute infarct is demonstrable on this study. Vascular: There is no appreciable hyperdense vessel. There is calcification in each carotid siphon region as  well as in the distal left vertebral artery. Skull: Bony calvarium appears intact. Sinuses/Orbits: There is mucosal thickening in multiple ethmoid air cells. There is mucosal thickening in the right maxillary antrum. Other visualized paranasal sinuses are clear. Visualized orbits appear symmetric bilaterally. Other: Mastoid air cells are clear. IMPRESSION: Atrophy with extensive supratentorial small vessel disease. Prior small infarcts noted in the anterior left thalamus, posterior left lentiform nucleus, left external capsule, and upper right pons. No acute infarct demonstrable on this study. No intracranial mass, hemorrhage, or extra-axial fluid collection. There are foci of arterial vascular calcification. Foci of paranasal sinus disease noted. Electronically Signed   By: Lowella Grip III M.D.   On: 12/02/2016 12:12  Mr Virgel Paling FF Contrast  Result Date: 12/02/2016 CLINICAL DATA:  81 year old male with slurred speech and weakness since 1000 hours yesterday. Dizziness, drooling and choking. EXAM: MRI HEAD WITHOUT CONTRAST MRA HEAD WITHOUT CONTRAST TECHNIQUE: Multiplanar, multiecho pulse sequences of the brain and surrounding structures were obtained without intravenous contrast. Angiographic images of the head were obtained using MRA technique without contrast. COMPARISON:  Head CT without contrast 1158 hours today. Brain MRI and intracranial MRA 09/13/2015. FINDINGS: MRI HEAD FINDINGS Brain: 20 mm curvilinear area of restricted diffusion tracking from the left corona radiata into the posterior left lentiform (Series 6, image 51). Associated mild T2 and FLAIR hyperintensity. There is a separate small area of restricted diffusion along the floor of the left lentiform on series 6, image 54. No associated hemorrhage or mass effect. No other restricted diffusion. Underlying multifocal chronic lacunar infarcts in the bilateral deep gray matter nuclei, the right lateral pons, the left cerebellum, and  bilateral cerebral white matter with chronic confluent bilateral white matter T2 and FLAIR hyperintensity. Outside of the acute findings gray and white matter signal is stable. No chronic cerebral blood products identified. No midline shift, mass effect, evidence of mass lesion, ventriculomegaly, extra-axial collection or acute intracranial hemorrhage. Cervicomedullary junction and pituitary are within normal limits. Vascular: Major intracranial vascular flow voids stable with generalized intracranial artery dolichoectasia. Skull and upper cervical spine: Negative. Visualized bone marrow signal is within normal limits. Sinuses/Orbits: Leftward gaze deviation. Otherwise negative orbits soft tissues. Mild paranasal sinus mucosal thickening is stable. Other: Trace mastoid fluid on the left is increased but appears inconsequential. Visible internal auditory structures appear normal. Scalp and face soft tissues appear negative. MRA HEAD FINDINGS Dominant distal left vertebral artery is stable since 2017 with tortuosity and irregularity but no significant stenosis. Diminutive non dominant distal right vertebral artery is highly irregular and may be occluded proximal to the right PICA origin. The distal right V4 segment to the right PICA may be supplied in a retrograde fashion. Stable basilar artery with irregularity but no significant stenosis. SCA and PCA origins remain patent and within normal limits. Moderate to severe chronic right P2 segment irregularity and stenosis. Tortuous left P1 segment. Stable PCAs. Stable antegrade flow in both ICA siphons. Bilateral siphon irregularity without significant stenosis. Posterior communicating arteries are diminutive or absent. Patent carotid termini. Stable MCA and ACA origins. The right A1 is dominant. Moderate chronic stenosis at the left ACA origin is stable. Visible ACA branches are within normal limits. Left MCA M1 segment and left MCA bifurcation remain patent with mild  irregularity but no stenosis. Increased moderate stenosis in a posterior left MCA M2 branch on series 109, image 10. Stable right MCA and right MCA bifurcation where there is mild irregularity and stenosis. Stable right MCA branches. IMPRESSION: 1. Small acute lacunar infarcts in the left lenticulostriate artery territory. No associated hemorrhage or mass effect. The dominant infarct extends from the left corona radiata to the posterior lentiform. 2. Underlying severe chronic small vessel ischemia is otherwise stable since 2017. 3. 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 - progressed left MCA M2 branch stenosis, now moderate. - moderate to severe chronic stenosis of the right PCA. - chronic mild to moderate stenosis at the left ACA origin and right MCA bifurcation. Electronically Signed   By: Genevie Ann M.D.   On: 12/02/2016 14:33   Mr Brain Lottie Dawson  Contrast  Result Date: 12/02/2016 CLINICAL DATA:  81 year old male with slurred speech and weakness since 1000 hours yesterday. Dizziness, drooling and choking. EXAM: MRI HEAD WITHOUT CONTRAST MRA HEAD WITHOUT CONTRAST TECHNIQUE: Multiplanar, multiecho pulse sequences of the brain and surrounding structures were obtained without intravenous contrast. Angiographic images of the head were obtained using MRA technique without contrast. COMPARISON:  Head CT without contrast 1158 hours today. Brain MRI and intracranial MRA 09/13/2015. FINDINGS: MRI HEAD FINDINGS Brain: 20 mm curvilinear area of restricted diffusion tracking from the left corona radiata into the posterior left lentiform (Series 6, image 51). Associated mild T2 and FLAIR hyperintensity. There is a separate small area of restricted diffusion along the floor of the left lentiform on series 6, image 54. No associated hemorrhage or mass effect. No other restricted  diffusion. Underlying multifocal chronic lacunar infarcts in the bilateral deep gray matter nuclei, the right lateral pons, the left cerebellum, and bilateral cerebral white matter with chronic confluent bilateral white matter T2 and FLAIR hyperintensity. Outside of the acute findings gray and white matter signal is stable. No chronic cerebral blood products identified. No midline shift, mass effect, evidence of mass lesion, ventriculomegaly, extra-axial collection or acute intracranial hemorrhage. Cervicomedullary junction and pituitary are within normal limits. Vascular: Major intracranial vascular flow voids stable with generalized intracranial artery dolichoectasia. Skull and upper cervical spine: Negative. Visualized bone marrow signal is within normal limits. Sinuses/Orbits: Leftward gaze deviation. Otherwise negative orbits soft tissues. Mild paranasal sinus mucosal thickening is stable. Other: Trace mastoid fluid on the left is increased but appears inconsequential. Visible internal auditory structures appear normal. Scalp and face soft tissues appear negative. MRA HEAD FINDINGS Dominant distal left vertebral artery is stable since 2017 with tortuosity and irregularity but no significant stenosis. Diminutive non dominant distal right vertebral artery is highly irregular and may be occluded proximal to the right PICA origin. The distal right V4 segment to the right PICA may be supplied in a retrograde fashion. Stable basilar artery with irregularity but no significant stenosis. SCA and PCA origins remain patent and within normal limits. Moderate to severe chronic right P2 segment irregularity and stenosis. Tortuous left P1 segment. Stable PCAs. Stable antegrade flow in both ICA siphons. Bilateral siphon irregularity without significant stenosis. Posterior communicating arteries are diminutive or absent. Patent carotid termini. Stable MCA and ACA origins. The right A1 is dominant. Moderate chronic stenosis at  the left ACA origin is stable. Visible ACA branches are within normal limits. Left MCA M1 segment and left MCA bifurcation remain patent with mild irregularity but no stenosis. Increased moderate stenosis in a posterior left MCA M2 branch on series 109, image 10. Stable right MCA and right MCA bifurcation where there is mild irregularity and stenosis. Stable right MCA branches. IMPRESSION: 1. Small acute lacunar infarcts in the left lenticulostriate artery territory. No associated hemorrhage or mass effect. The dominant infarct extends from the left corona radiata to the posterior lentiform. 2. Underlying severe chronic small vessel ischemia is otherwise stable since 2017. 3. 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 - progressed left MCA M2 branch stenosis, now moderate. - moderate to severe chronic stenosis of the right PCA. - chronic mild to moderate stenosis at the left ACA origin and right MCA bifurcation. Electronically Signed   By: Genevie Ann M.D.   On: 12/02/2016 14:33   US Carotid Bilateral (at [facility]  Only)  Result Date: 12/02/2016 CLINICAL DATA:  Stroke. EXAM: BILATERAL CAROTID DUPLEX ULTRASOUND TECHNIQUE: Pearline Cables scale imaging, color Doppler and duplex ultrasound were performed of bilateral carotid and vertebral arteries in the neck. COMPARISON:  09/13/2015 FINDINGS: Criteria: Quantification of carotid stenosis is based on velocity parameters that correlate the residual internal carotid diameter with NASCET-based stenosis levels, using the diameter of the distal internal carotid lumen as the denominator for stenosis measurement. The following velocity measurements were obtained: RIGHT ICA:  169/23 cm/sec CCA:  66/44 cm/sec SYSTOLIC ICA/CCA RATIO:  2.1 DIASTOLIC ICA/CCA RATIO:  1.5 ECA:  121 cm/sec LEFT ICA:  104/38 cm/sec CCA:  03/47 cm/sec SYSTOLIC  ICA/CCA RATIO:  1.1 DIASTOLIC ICA/CCA RATIO:  2.4 ECA:  149 cm/sec RIGHT CAROTID ARTERY: Diffuse atheromatous wall thickening of the common carotid. Calcified and noncalcified plaque at the bifurcation. Low resistance waveform. RIGHT VERTEBRAL ARTERY:  Antegrade flow. LEFT CAROTID ARTERY: Diffuse atheromatous wall thickening of the common carotid. Predominately calcified moderate to bulky plaque at the bifurcation. LEFT VERTEBRAL ARTERY:  Antegrade flow. IMPRESSION: 1. Diffuse atherosclerotic plaque in the cervical carotids. 2. Elevated right ICA velocities in the lower end of the 50-69% stenotic range. 3. Less than 50% left ICA stenosis. 4. Antegrade flow in both vertebral arteries. Electronically Signed   By: Monte Fantasia M.D.   On: 12/02/2016 19:08      EKG: Independently reviewed. Normal sinus rhythm  Assessment/Plan Principal Problem:   Near syncope Active Problems:   Essential hypertension, benign   Type 2 diabetes mellitus (HCC)   Hyperlipidemia   Acute CVA (cerebrovascular accident) [facility])   Essential hypertension   CVA (cerebral vascular accident) ([place])       Acute Ischemic stroke Near syncope likely secondary to hypotensive episode, blood pressure still low but the patient presented to the ED.  Appears clinically dehydrated with increasing creatinine since this morning  Discussed with neurology ,Dr Max Fickle, and they recommend completing workup CT brain--neg for acute findings -MRI brain--small acute lacunar infarct lenticulostriate artery territory -MRA brain--progressed left MCA M2 branch stenosis, now moderate. - moderate to severe chronic stenosis of the right PCA. - chronic mild to moderate stenosis at the left ACA origin and right MCA bifurcation. -Carotid Duplex--50%-69% on right-->pt refused CTA of neck; refused transfer-->will order  CTA head and neck -Echo-  -LDL--49 -HbA1C-- 6.9 -Antiplatelet--ASA +  Plavix  Dysphagia -appreciate speech eval-->regular with thin liquids  Essential hypertension -Hold  lisinopril/HCTZ, Aldactone,  to allow for permissive hypertension -Hydralazineprn SBP>220    Diabetes mellitus type 2 -NovoLog sliding scale -Hemoglobin A1c-- 6.9 -Holding metformin--restart after d/c -CBGs controlled during hospitalization  Hyperlipidemia -Continue statin--hx of intolerance to lipitor-->continue home dose pravastatin -LDL 49 -Continue TriCor  Hypothyroidism -Continue Synthroid        DVT prophylaxis:  lovenox         consults called: neurology via telephone   Family Communication: Admission, patients condition and plan of care including tests being ordered have been discussed with the patient  who indicates understanding and agree with the plan and Code Status  Admission status: inpatient    Disposition plan: Further plan will depend as patient's clinical course evolves and further radiologic and laboratory data become available. Likely home when stable   At the time of admission, it appears that the appropriate admission status for this patient is INPATIENT .Thisis judged to be reasonable and necessary in order to provide the required intensity of service to ensure the patient's safetygiven thepresenting symptoms, physical exam findings,  and initial radiographic and laboratory data in the context of their chronic comorbidities.   Reyne Dumas MD [facility] Pager [phone]  If 7PM-7AM, please contact night-coverage [email or web address] Password [facility]  12/03/2016, 3:41 PM

## 2016-12-03 NOTE — CV Procedure (Signed)
Mr. Cupples refused his echocardiogram. He stated he was leaving at 10 am and his primary care doctor can follow up if needed.  Darlina Sicilian RDCS

## 2016-12-03 NOTE — Progress Notes (Signed)
Pt is refusing bed alarm.  States he will comply w/asking for assistance if he needs to get up for any reason.

## 2016-12-03 NOTE — Discharge Summary (Signed)
Physician Discharge Summary  Steve Ramirez OHY:[id number] DOB: Apr 29, 1935 DOA: 12/02/2016  PCP: Sharilyn Sites, MD  Admit date: 12/02/2016 Discharge date: 12/03/2016  Admitted From: Home Disposition:  Home   Recommendations for Outpatient Follow-up:  1. Follow up with PCP in 1-2 weeks 2. Please obtain BMP/CBC in one week     Discharge Condition: Stable CODE STATUS: FULL Diet recommendation: Heart Healthy / Carb Modified   Brief/Interim Summary: 81 y.o. 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  The patient denied any new right upper extremity or right lower extremity weakness, but stated that while he was walking on December 02, 2016, he kept drifting to his right.  In addition, the patient states that his dysarthria worsened on the morning of December 02, 2016.  The patient also has some drooling on the right side of his mouth with worsening dysphagia type symptoms.  As a result, the patient presented to emergency department for further evaluation. Initially, the patient wanted to leave [place].  The risks, benefits, and alternatives were discussed with the patient and his family.  After an extensive discussion, the patient finally agreed to stay in the hospital overnight. The patient refused transfer to tertiary care center.  The patient's deficits remained stable/improved after overnight observation.  The patient did not want any further workup during hospitalization and stated he would prefer outpatient follow-up and workup.  The patient was discharged in stable condition with instructions to take aspirin 81 mg daily with Plavix.     Discharge Diagnoses:  Acute Ischemic stroke -Neurology Consult not available--offered  transfer to [place]--he absolutely refuses after discussion of risks, benefits, alternatives -case discussed with neurology, Dr. Loleta Books + Plavix -PT eval-->out pt PT -Speech therapy eval-->regular diet with thin liquids -CT brain--neg for acute findings -MRI brain--small acute lacunar infarct lenticulostriate artery territory -MRA brain--progressed left MCA M2 branch stenosis, now moderate.             - moderate to severe chronic stenosis of the right PCA.             - chronic mild to moderate stenosis at the left ACA origin and right             MCA bifurcation. -Carotid Duplex--50%-69% on right-->pt refused CTA of neck; refused transfer-->will need outpt follow up and CTA neck -Echo--pt refused -LDL--49 -HbA1C--pending at time of d/c -Antiplatelet--ASA + Plavix  Dysphagia -appreciate speech eval-->regular with thin liquids  Essential hypertension -Holding lisinopril/HCTZ, Aldactone, and metoprolol succinate to allow for permissive hypertension -Hydralazine prn SBP>220 -resume lisinopril/HCTZ, Aldactone, and metoprolol succinate after d/c  Diabetes mellitus type 2 -NovoLog sliding scale -Hemoglobin A1c--pending at time of d/c -Holding metformin--restart after d/c -CBGs controlled during hospitalization  Hyperlipidemia -Continue statin--hx of intolerance to lipitor-->continue home dose pravastatin -LDL 49 -Continue TriCor  Hypothyroidism -Continue Synthroid     Discharge Instructions  Discharge Instructions    Ambulatory referral to Neurology   Complete by:  As directed    Refer to [facility] Neurology--Dr. Xu--recurrent stroke while on plavix   Diet - low sodium heart healthy   Complete by:  As directed    Diet Carb Modified   Complete by:  As directed    Increase activity slowly   Complete by:  As directed  Allergies as of 12/03/2016   No Known Allergies     Medication List    STOP taking these medications   atorvastatin 10 MG  tablet Commonly known as:  LIPITOR     TAKE these medications   aspirin 81 MG EC tablet Take 1 tablet (81 mg total) by mouth daily. Start taking on:  12/04/2016 Notes to patient:  Take WITH Plavix.   cetirizine 10 MG tablet Commonly known as:  ZYRTEC Take 10 mg by mouth daily.   clopidogrel 75 MG tablet Commonly known as:  PLAVIX Take 1 tablet (75 mg total) by mouth daily.   fenofibrate 145 MG tablet Commonly known as:  TRICOR Take 145 mg by mouth daily.   isosorbide mononitrate 30 MG 24 hr tablet Commonly known as:  IMDUR Take 1 tablet by mouth daily.   levothyroxine 50 MCG tablet Commonly known as:  SYNTHROID, LEVOTHROID Take 1 tablet by mouth daily.   lisinopril-hydrochlorothiazide 20-12.5 MG tablet Commonly known as:  PRINZIDE,ZESTORETIC Take 1 tablet by mouth 2 (two) times daily.   metFORMIN 500 MG tablet Commonly known as:  GLUCOPHAGE Take 500 mg by mouth 2 (two) times daily.   metoprolol succinate 50 MG 24 hr tablet Commonly known as:  TOPROL-XL Take 50 mg by mouth daily. Take with or immediately following a meal.   multivitamin tablet Take 1 tablet by mouth daily.   nitroGLYCERIN 0.4 MG SL tablet Commonly known as:  NITROSTAT Place 0.4 mg under the tongue every 5 (five) minutes as needed for chest pain.   OMEGA 3 PO Take 1,000 mg by mouth daily.   pravastatin 10 MG tablet Commonly known as:  PRAVACHOL Take 1 tablet by mouth daily.   rOPINIRole 0.5 MG tablet Commonly known as:  REQUIP Take 1 tablet by mouth 2 (two) times daily.   Saw Palmetto 450 MG Caps Take 1 capsule by mouth daily.   spironolactone 25 MG tablet Commonly known as:  ALDACTONE Take 1 tablet by mouth daily.   tamsulosin 0.4 MG Caps capsule Commonly known as:  FLOMAX Take 0.4 mg by mouth daily.       No Known Allergies  Consultations:  Neurology on phone   Procedures/Studies: Ct Head Wo Contrast  Result Date: 12/02/2016 CLINICAL DATA:  Slurred speech and  altered mental status. Right-sided weakness. Dizziness. EXAM: CT HEAD WITHOUT CONTRAST TECHNIQUE: Contiguous axial images were obtained from the base of the skull through the vertex without intravenous contrast. COMPARISON:  Head CT and brain MRI September 13, 2015 FINDINGS: Brain: There is mild diffuse atrophy. There is no intracranial mass, hemorrhage, extra-axial fluid collection, or midline shift. There is patchy small vessel disease throughout the centra semiovale bilaterally. There is a prior small infarct in the anterior left thalamus as well as a nearby small infarct in the posterior left lentiform nucleus. Small vessel disease is noted in the external capsule on the left. There is small vessel disease with prior small infarct in the lateral right upper pons. There is no new gray-white compartment lesion. No acute infarct is demonstrable on this study. Vascular: There is no appreciable hyperdense vessel. There is calcification in each carotid siphon region as well as in the distal left vertebral artery. Skull: Bony calvarium appears intact. Sinuses/Orbits: There is mucosal thickening in multiple ethmoid air cells. There is mucosal thickening in the right maxillary antrum. Other visualized paranasal sinuses are clear. Visualized orbits appear symmetric bilaterally. Other: Mastoid air cells are clear. IMPRESSION: Atrophy with extensive supratentorial small  vessel disease. Prior small infarcts noted in the anterior left thalamus, posterior left lentiform nucleus, left external capsule, and upper right pons. No acute infarct demonstrable on this study. No intracranial mass, hemorrhage, or extra-axial fluid collection. There are foci of arterial vascular calcification. Foci of paranasal sinus disease noted. Electronically Signed   By: Lowella Grip III M.D.   On: 12/02/2016 12:12   Mr [facility] Paling ZJ Contrast  Result Date: 12/02/2016 CLINICAL DATA:  81 year old male with slurred speech and weakness since 1000  hours yesterday. Dizziness, drooling and choking. EXAM: MRI HEAD WITHOUT CONTRAST MRA HEAD WITHOUT CONTRAST TECHNIQUE: Multiplanar, multiecho pulse sequences of the brain and surrounding structures were obtained without intravenous contrast. Angiographic images of the head were obtained using MRA technique without contrast. COMPARISON:  Head CT without contrast 1158 hours today. Brain MRI and intracranial MRA 09/13/2015. FINDINGS: MRI HEAD FINDINGS Brain: 20 mm curvilinear area of restricted diffusion tracking from the left corona radiata into the posterior left lentiform (Series 6, image 51). Associated mild T2 and FLAIR hyperintensity. There is a separate small area of restricted diffusion along the floor of the left lentiform on series 6, image 54. No associated hemorrhage or mass effect. No other restricted diffusion. Underlying multifocal chronic lacunar infarcts in the bilateral deep gray matter nuclei, the right lateral pons, the left cerebellum, and bilateral cerebral white matter with chronic confluent bilateral white matter T2 and FLAIR hyperintensity. Outside of the acute findings gray and white matter signal is stable. No chronic cerebral blood products identified. No midline shift, mass effect, evidence of mass lesion, ventriculomegaly, extra-axial collection or acute intracranial hemorrhage. Cervicomedullary junction and pituitary are within normal limits. Vascular: Major intracranial vascular flow voids stable with generalized intracranial artery dolichoectasia. Skull and upper cervical spine: Negative. Visualized bone marrow signal is within normal limits. Sinuses/Orbits: Leftward gaze deviation. Otherwise negative orbits soft tissues. Mild paranasal sinus mucosal thickening is stable. Other: Trace mastoid fluid on the left is increased but appears inconsequential. Visible internal auditory structures appear normal. Scalp and face soft tissues appear negative. MRA HEAD FINDINGS Dominant distal left  vertebral artery is stable since 2017 with tortuosity and irregularity but no significant stenosis. Diminutive non dominant distal right vertebral artery is highly irregular and may be occluded proximal to the right PICA origin. The distal right V4 segment to the right PICA may be supplied in a retrograde fashion. Stable basilar artery with irregularity but no significant stenosis. SCA and PCA origins remain patent and within normal limits. Moderate to severe chronic right P2 segment irregularity and stenosis. Tortuous left P1 segment. Stable PCAs. Stable antegrade flow in both ICA siphons. Bilateral siphon irregularity without significant stenosis. Posterior communicating arteries are diminutive or absent. Patent carotid termini. Stable MCA and ACA origins. The right A1 is dominant. Moderate chronic stenosis at the left ACA origin is stable. Visible ACA branches are within normal limits. Left MCA M1 segment and left MCA bifurcation remain patent with mild irregularity but no stenosis. Increased moderate stenosis in a posterior left MCA M2 branch on series 109, image 10. Stable right MCA and right MCA bifurcation where there is mild irregularity and stenosis. Stable right MCA branches. IMPRESSION: 1. Small acute lacunar infarcts in the left lenticulostriate artery territory. No associated hemorrhage or mass effect. The dominant infarct extends from the left corona radiata to the posterior lentiform. 2. Underlying severe chronic small vessel ischemia is otherwise stable since 2017. 3. Chronically advanced intracranial atherosclerosis appears largely stable since the 2017 MRA: -  probable chronic occlusion and reconstitution of the non dominant distal right vertebral artery. - progressed left MCA M2 branch stenosis, now moderate. - moderate to severe chronic stenosis of the right PCA. - chronic mild to moderate stenosis at the left ACA origin and right MCA bifurcation. Electronically Signed   By: Genevie Ann M.D.   On:  12/02/2016 14:33   Mr Brain Wo Contrast  Result Date: 12/02/2016 CLINICAL DATA:  81 year old male with slurred speech and weakness since 1000 hours yesterday. Dizziness, drooling and choking. EXAM: MRI HEAD WITHOUT CONTRAST MRA HEAD WITHOUT CONTRAST TECHNIQUE: Multiplanar, multiecho pulse sequences of the brain and surrounding structures were obtained without intravenous contrast. Angiographic images of the head were obtained using MRA technique without contrast. COMPARISON:  Head CT without contrast 1158 hours today. Brain MRI and intracranial MRA 09/13/2015. FINDINGS: MRI HEAD FINDINGS Brain: 20 mm curvilinear area of restricted diffusion tracking from the left corona radiata into the posterior left lentiform (Series 6, image 51). Associated mild T2 and FLAIR hyperintensity. There is a separate small area of restricted diffusion along the floor of the left lentiform on series 6, image 54. No associated hemorrhage or mass effect. No other restricted diffusion. Underlying multifocal chronic lacunar infarcts in the bilateral deep gray matter nuclei, the right lateral pons, the left cerebellum, and bilateral cerebral white matter with chronic confluent bilateral white matter T2 and FLAIR hyperintensity. Outside of the acute findings gray and white matter signal is stable. No chronic cerebral blood products identified. No midline shift, mass effect, evidence of mass lesion, ventriculomegaly, extra-axial collection or acute intracranial hemorrhage. Cervicomedullary junction and pituitary are within normal limits. Vascular: Major intracranial vascular flow voids stable with generalized intracranial artery dolichoectasia. Skull and upper cervical spine: Negative. Visualized bone marrow signal is within normal limits. Sinuses/Orbits: Leftward gaze deviation. Otherwise negative orbits soft tissues. Mild paranasal sinus mucosal thickening is stable. Other: Trace mastoid fluid on the left is increased but appears  inconsequential. Visible internal auditory structures appear normal. Scalp and face soft tissues appear negative. MRA HEAD FINDINGS Dominant distal left vertebral artery is stable since 2017 with tortuosity and irregularity but no significant stenosis. Diminutive non dominant distal right vertebral artery is highly irregular and may be occluded proximal to the right PICA origin. The distal right V4 segment to the right PICA may be supplied in a retrograde fashion. Stable basilar artery with irregularity but no significant stenosis. SCA and PCA origins remain patent and within normal limits. Moderate to severe chronic right P2 segment irregularity and stenosis. Tortuous left P1 segment. Stable PCAs. Stable antegrade flow in both ICA siphons. Bilateral siphon irregularity without significant stenosis. Posterior communicating arteries are diminutive or absent. Patent carotid termini. Stable MCA and ACA origins. The right A1 is dominant. Moderate chronic stenosis at the left ACA origin is stable. Visible ACA branches are within normal limits. Left MCA M1 segment and left MCA bifurcation remain patent with mild irregularity but no stenosis. Increased moderate stenosis in a posterior left MCA M2 branch on series 109, image 10. Stable right MCA and right MCA bifurcation where there is mild irregularity and stenosis. Stable right MCA branches. IMPRESSION: 1. Small acute lacunar infarcts in the left lenticulostriate artery territory. No associated hemorrhage or mass effect. The dominant infarct extends from the left corona radiata to the posterior lentiform. 2. Underlying severe chronic small vessel ischemia is otherwise stable since 2017. 3. Chronically advanced intracranial atherosclerosis appears largely stable since the 2017 MRA: - probable chronic occlusion and  reconstitution of the non dominant distal right vertebral artery. - progressed left MCA M2 branch stenosis, now moderate. - moderate to severe chronic stenosis  of the right PCA. - chronic mild to moderate stenosis at the left ACA origin and right MCA bifurcation. Electronically Signed   By: Genevie Ann M.D.   On: 12/02/2016 14:33   US Carotid Bilateral (at [facility] Only)  Result Date: 12/02/2016 CLINICAL DATA:  Stroke. EXAM: BILATERAL CAROTID DUPLEX ULTRASOUND TECHNIQUE: Pearline Cables scale imaging, color Doppler and duplex ultrasound were performed of bilateral carotid and vertebral arteries in the neck. COMPARISON:  09/13/2015 FINDINGS: Criteria: Quantification of carotid stenosis is based on velocity parameters that correlate the residual internal carotid diameter with NASCET-based stenosis levels, using the diameter of the distal internal carotid lumen as the denominator for stenosis measurement. The following velocity measurements were obtained: RIGHT ICA:  169/23 cm/sec CCA:  18/29 cm/sec SYSTOLIC ICA/CCA RATIO:  2.1 DIASTOLIC ICA/CCA RATIO:  1.5 ECA:  121 cm/sec LEFT ICA:  104/38 cm/sec CCA:  93/71 cm/sec SYSTOLIC ICA/CCA RATIO:  1.1 DIASTOLIC ICA/CCA RATIO:  2.4 ECA:  149 cm/sec RIGHT CAROTID ARTERY: Diffuse atheromatous wall thickening of the common carotid. Calcified and noncalcified plaque at the bifurcation. Low resistance waveform. RIGHT VERTEBRAL ARTERY:  Antegrade flow. LEFT CAROTID ARTERY: Diffuse atheromatous wall thickening of the common carotid. Predominately calcified moderate to bulky plaque at the bifurcation. LEFT VERTEBRAL ARTERY:  Antegrade flow. IMPRESSION: 1. Diffuse atherosclerotic plaque in the cervical carotids. 2. Elevated right ICA velocities in the lower end of the 50-69% stenotic range. 3. Less than 50% left ICA stenosis. 4. Antegrade flow in both vertebral arteries. Electronically Signed   By: Monte Fantasia M.D.   On: 12/02/2016 19:08        Discharge Exam: Vitals:   12/03/16 0400 12/03/16 0800  BP: (!) 148/63 (!) 170/55  Pulse: (!) 59 80  Resp:  18  Temp: 98.6 F (37 C) (!) 97.5 F (36.4 C)  SpO2: 96% 98%   Vitals:    12/03/16 0000 12/03/16 0200 12/03/16 0400 12/03/16 0800  BP: (!) 149/62 131/69 (!) 148/63 (!) 170/55  Pulse: (!) 59 (!) 58 (!) 59 80  Resp:    18  Temp: 98.1 F (36.7 C) 97.7 F (36.5 C) 98.6 F (37 C) (!) 97.5 F (36.4 C)  TempSrc: Oral Oral Oral Oral  SpO2: 95% 97% 96% 98%  Weight:      Height:        General: Pt is alert, awake, not in acute distress Cardiovascular: RRR, S1/S2 +, no rubs, no gallops Respiratory: CTA bilaterally, no wheezing, no rhonchi Abdominal: Soft, NT, ND, bowel sounds + Extremities: no edema, no cyanosis Neuro:  CN II-XII intact, strength 4/5 in RUE, RLE, strength 4/5 LUE, LLE; sensation intact bilateral; no dysmetria; babinski equivocal    The results of significant diagnostics from this hospitalization (including imaging, microbiology, ancillary and laboratory) are listed below for reference.    Significant Diagnostic Studies: Ct Head Wo Contrast  Result Date: 12/02/2016 CLINICAL DATA:  Slurred speech and altered mental status. Right-sided weakness. Dizziness. EXAM: CT HEAD WITHOUT CONTRAST TECHNIQUE: Contiguous axial images were obtained from the base of the skull through the vertex without intravenous contrast. COMPARISON:  Head CT and brain MRI September 13, 2015 FINDINGS: Brain: There is mild diffuse atrophy. There is no intracranial mass, hemorrhage, extra-axial fluid collection, or midline shift. There is patchy small vessel disease throughout the centra semiovale bilaterally. There is a prior small  infarct in the anterior left thalamus as well as a nearby small infarct in the posterior left lentiform nucleus. Small vessel disease is noted in the external capsule on the left. There is small vessel disease with prior small infarct in the lateral right upper pons. There is no new gray-white compartment lesion. No acute infarct is demonstrable on this study. Vascular: There is no appreciable hyperdense vessel. There is calcification in each carotid siphon  region as well as in the distal left vertebral artery. Skull: Bony calvarium appears intact. Sinuses/Orbits: There is mucosal thickening in multiple ethmoid air cells. There is mucosal thickening in the right maxillary antrum. Other visualized paranasal sinuses are clear. Visualized orbits appear symmetric bilaterally. Other: Mastoid air cells are clear. IMPRESSION: Atrophy with extensive supratentorial small vessel disease. Prior small infarcts noted in the anterior left thalamus, posterior left lentiform nucleus, left external capsule, and upper right pons. No acute infarct demonstrable on this study. No intracranial mass, hemorrhage, or extra-axial fluid collection. There are foci of arterial vascular calcification. Foci of paranasal sinus disease noted. Electronically Signed   By: Lowella Grip III M.D.   On: 12/02/2016 12:12   Mr [facility] Paling AJ Contrast  Result Date: 12/02/2016 CLINICAL DATA:  81 year old male with slurred speech and weakness since 1000 hours yesterday. Dizziness, drooling and choking. EXAM: MRI HEAD WITHOUT CONTRAST MRA HEAD WITHOUT CONTRAST TECHNIQUE: Multiplanar, multiecho pulse sequences of the brain and surrounding structures were obtained without intravenous contrast. Angiographic images of the head were obtained using MRA technique without contrast. COMPARISON:  Head CT without contrast 1158 hours today. Brain MRI and intracranial MRA 09/13/2015. FINDINGS: MRI HEAD FINDINGS Brain: 20 mm curvilinear area of restricted diffusion tracking from the left corona radiata into the posterior left lentiform (Series 6, image 51). Associated mild T2 and FLAIR hyperintensity. There is a separate small area of restricted diffusion along the floor of the left lentiform on series 6, image 54. No associated hemorrhage or mass effect. No other restricted diffusion. Underlying multifocal chronic lacunar infarcts in the bilateral deep gray matter nuclei, the right lateral pons, the left cerebellum,  and bilateral cerebral white matter with chronic confluent bilateral white matter T2 and FLAIR hyperintensity. Outside of the acute findings gray and white matter signal is stable. No chronic cerebral blood products identified. No midline shift, mass effect, evidence of mass lesion, ventriculomegaly, extra-axial collection or acute intracranial hemorrhage. Cervicomedullary junction and pituitary are within normal limits. Vascular: Major intracranial vascular flow voids stable with generalized intracranial artery dolichoectasia. Skull and upper cervical spine: Negative. Visualized bone marrow signal is within normal limits. Sinuses/Orbits: Leftward gaze deviation. Otherwise negative orbits soft tissues. Mild paranasal sinus mucosal thickening is stable. Other: Trace mastoid fluid on the left is increased but appears inconsequential. Visible internal auditory structures appear normal. Scalp and face soft tissues appear negative. MRA HEAD FINDINGS Dominant distal left vertebral artery is stable since 2017 with tortuosity and irregularity but no significant stenosis. Diminutive non dominant distal right vertebral artery is highly irregular and may be occluded proximal to the right PICA origin. The distal right V4 segment to the right PICA may be supplied in a retrograde fashion. Stable basilar artery with irregularity but no significant stenosis. SCA and PCA origins remain patent and within normal limits. Moderate to severe chronic right P2 segment irregularity and stenosis. Tortuous left P1 segment. Stable PCAs. Stable antegrade flow in both ICA siphons. Bilateral siphon irregularity without significant stenosis. Posterior communicating arteries are diminutive or absent. Patent carotid  termini. Stable MCA and ACA origins. The right A1 is dominant. Moderate chronic stenosis at the left ACA origin is stable. Visible ACA branches are within normal limits. Left MCA M1 segment and left MCA bifurcation remain patent with  mild irregularity but no stenosis. Increased moderate stenosis in a posterior left MCA M2 branch on series 109, image 10. Stable right MCA and right MCA bifurcation where there is mild irregularity and stenosis. Stable right MCA branches. IMPRESSION: 1. Small acute lacunar infarcts in the left lenticulostriate artery territory. No associated hemorrhage or mass effect. The dominant infarct extends from the left corona radiata to the posterior lentiform. 2. Underlying severe chronic small vessel ischemia is otherwise stable since 2017. 3. 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 - progressed left MCA M2 branch stenosis, now moderate. - moderate to severe chronic stenosis of the right PCA. - chronic mild to moderate stenosis at the left ACA origin and right MCA bifurcation. Electronically Signed   By: Genevie Ann M.D.   On: 12/02/2016 14:33   Mr Brain Wo Contrast  Result Date: 12/02/2016 CLINICAL DATA:  81 year old male with slurred speech and weakness since 1000 hours yesterday. Dizziness, drooling and choking. EXAM: MRI HEAD WITHOUT CONTRAST MRA HEAD WITHOUT CONTRAST TECHNIQUE: Multiplanar, multiecho pulse sequences of the brain and surrounding structures were obtained without intravenous contrast. Angiographic images of the head were obtained using MRA technique without contrast. COMPARISON:  Head CT without contrast 1158 hours today. Brain MRI and intracranial MRA 09/13/2015. FINDINGS: MRI HEAD FINDINGS Brain: 20 mm curvilinear area of restricted diffusion tracking from the left corona radiata into the posterior left lentiform (Series 6, image 51). Associated mild T2 and FLAIR hyperintensity. There is a separate small area of restricted diffusion along the floor of the left lentiform on series 6, image 54. No associated hemorrhage or mass effect. No other restricted  diffusion. Underlying multifocal chronic lacunar infarcts in the bilateral deep gray matter nuclei, the right lateral pons, the left cerebellum, and bilateral cerebral white matter with chronic confluent bilateral white matter T2 and FLAIR hyperintensity. Outside of the acute findings gray and white matter signal is stable. No chronic cerebral blood products identified. No midline shift, mass effect, evidence of mass lesion, ventriculomegaly, extra-axial collection or acute intracranial hemorrhage. Cervicomedullary junction and pituitary are within normal limits. Vascular: Major intracranial vascular flow voids stable with generalized intracranial artery dolichoectasia. Skull and upper cervical spine: Negative. Visualized bone marrow signal is within normal limits. Sinuses/Orbits: Leftward gaze deviation. Otherwise negative orbits soft tissues. Mild paranasal sinus mucosal thickening is stable. Other: Trace mastoid fluid on the left is increased but appears inconsequential. Visible internal auditory structures appear normal. Scalp and face soft tissues appear negative. MRA HEAD FINDINGS Dominant distal left vertebral artery is stable since 2017 with tortuosity and irregularity but no significant stenosis. Diminutive non dominant distal right vertebral artery is highly irregular and may be occluded proximal to the right PICA origin. The distal right V4 segment to the right PICA may be supplied in a retrograde fashion. Stable basilar artery with irregularity but no significant stenosis. SCA and PCA origins remain patent and within normal limits. Moderate to severe chronic right P2 segment irregularity and stenosis. Tortuous left P1 segment. Stable PCAs. Stable antegrade flow in both ICA siphons. Bilateral siphon irregularity without significant stenosis. Posterior communicating arteries are diminutive or absent. Patent carotid termini. Stable MCA and  ACA origins. The right A1 is dominant. Moderate chronic stenosis at  the left ACA origin is stable. Visible ACA branches are within normal limits. Left MCA M1 segment and left MCA bifurcation remain patent with mild irregularity but no stenosis. Increased moderate stenosis in a posterior left MCA M2 branch on series 109, image 10. Stable right MCA and right MCA bifurcation where there is mild irregularity and stenosis. Stable right MCA branches. IMPRESSION: 1. Small acute lacunar infarcts in the left lenticulostriate artery territory. No associated hemorrhage or mass effect. The dominant infarct extends from the left corona radiata to the posterior lentiform. 2. Underlying severe chronic small vessel ischemia is otherwise stable since 2017. 3. 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 - progressed left MCA M2 branch stenosis, now moderate. - moderate to severe chronic stenosis of the right PCA. - chronic mild to moderate stenosis at the left ACA origin and right MCA bifurcation. Electronically Signed   By: Genevie Ann M.D.   On: 12/02/2016 14:33   US Carotid Bilateral (at [facility] Only)  Result Date: 12/02/2016 CLINICAL DATA:  Stroke. EXAM: BILATERAL CAROTID DUPLEX ULTRASOUND TECHNIQUE: Pearline Cables scale imaging, color Doppler and duplex ultrasound were performed of bilateral carotid and vertebral arteries in the neck. COMPARISON:  09/13/2015 FINDINGS: Criteria: Quantification of carotid stenosis is based on velocity parameters that correlate the residual internal carotid diameter with NASCET-based stenosis levels, using the diameter of the distal internal carotid lumen as the denominator for stenosis measurement. The following velocity measurements were obtained: RIGHT ICA:  169/23 cm/sec CCA:  74/12 cm/sec SYSTOLIC ICA/CCA RATIO:  2.1 DIASTOLIC ICA/CCA RATIO:  1.5 ECA:  121 cm/sec LEFT ICA:  104/38 cm/sec CCA:  87/86 cm/sec SYSTOLIC  ICA/CCA RATIO:  1.1 DIASTOLIC ICA/CCA RATIO:  2.4 ECA:  149 cm/sec RIGHT CAROTID ARTERY: Diffuse atheromatous wall thickening of the common carotid. Calcified and noncalcified plaque at the bifurcation. Low resistance waveform. RIGHT VERTEBRAL ARTERY:  Antegrade flow. LEFT CAROTID ARTERY: Diffuse atheromatous wall thickening of the common carotid. Predominately calcified moderate to bulky plaque at the bifurcation. LEFT VERTEBRAL ARTERY:  Antegrade flow. IMPRESSION: 1. Diffuse atherosclerotic plaque in the cervical carotids. 2. Elevated right ICA velocities in the lower end of the 50-69% stenotic range. 3. Less than 50% left ICA stenosis. 4. Antegrade flow in both vertebral arteries. Electronically Signed   By: Monte Fantasia M.D.   On: 12/02/2016 19:08     Microbiology: No results found for this or any previous visit (from the past 240 hour(s)).   Labs: Basic Metabolic Panel: Recent Labs  Lab 12/02/16 1133 12/02/16 1136 12/03/16 0654  NA 137 141 137  K 3.6 3.7 3.5  CL 102 104 103  CO2 27  --  26  GLUCOSE 105* 102* 132*  BUN 24* 24* 22*  CREATININE 1.02 1.00 1.02  CALCIUM 10.4*  --  9.6   Liver Function Tests: Recent Labs  Lab 12/02/16 1133  AST 28  ALT 33  ALKPHOS 43  BILITOT 1.0  PROT 8.0  ALBUMIN 4.8   No results for input(s): LIPASE, AMYLASE in the last 168 hours. No results for input(s): AMMONIA in the last 168 hours. CBC: Recent Labs  Lab 12/02/16 1133 12/02/16 1136  WBC 5.3  --   NEUTROABS 2.8  --   HGB 14.0 14.6  HCT 44.0 43.0  MCV 83.3  --   PLT 293  --  Cardiac Enzymes: No results for input(s): CKTOTAL, CKMB, CKMBINDEX, TROPONINI in the last 168 hours. BNP: Invalid input(s): POCBNP CBG: Recent Labs  Lab 12/02/16 2256 12/03/16 0735  GLUCAP 107* 116*    Time coordinating discharge:  Greater than 30 minutes  Signed:  Chantea Surace, DO [facility] Pager: [phone] 12/03/2016, 10:16 AM

## 2016-12-03 NOTE — ED Notes (Signed)
ED Provider at bedside. 

## 2016-12-03 NOTE — ED Provider Notes (Signed)
[facility] EMERGENCY DEPARTMENT Provider Note   CSN: [id number] Arrival date & time: 12/03/16  1305     History   Chief Complaint Chief Complaint  Patient presents with  . Loss of Consciousness   Level 5 caveat patient amnestic for event HPI Steve Ramirez is a 81 y.o. male.  HPI  81 year old male who was discharged from this facility this a.m. after admission for stroke presents today after syncopal episode.  Per EMS report he was sitting at the table with his family eating when he became unresponsive.  Unknown episode length of unresponsiveness.  The patient was awake upon EMS arrival.  He currently is awake and alert without any complaints.  The above history was obtained from nursing via EMS.  Family is reported to be in route. 1:37 PM Wife and son present at bedside.  They state that patient was assisting with fixing meal.  He appeared to stumble when he sat down in his chair.  He then became unresponsive.  He had food in his mouth.  The episode lasted approximately 10 minutes.  They state that they felt like he was trying to speak to them multiple times during that.  He was then awake without complaints by the time EMS arrived. From this admission with d/c today: MRI brain--small acute lacunar infarct lenticulostriate artery territory -MRA brain--progressed left MCA M2 branch stenosis, now moderate. - moderate to severe chronic stenosis of the right PCA. - chronic mild to moderate stenosis at the left ACA origin and right MCA bifurcation   Past Medical History:  Diagnosis Date  . Coronary atherosclerosis of native coronary artery   . Essential hypertension, benign   . GIB (gastrointestinal bleeding)    Colonic diverticular bleed  . History of melanoma   . History of TIAs   . Stroke ([place])   . Type 2 diabetes mellitus [facility])     Patient Active Problem List   Diagnosis Date Noted  . Right hemiparesis ([place]) 12/02/2016  . Acute  ischemic stroke ([place]) 12/02/2016  . Essential hypertension 12/02/2016  . Dysphagia 12/02/2016  . Acute CVA (cerebrovascular accident) ([place]) 09/13/2015  . TIA (transient ischemic attack) 11/29/2014  . Shoulder stiffness 09/05/2012  . Right rotator cuff tear 08/16/2012  . Shoulder subluxation, right 08/09/2012  . Elevated transaminase level 11/05/2011  . Coronary atherosclerosis of native coronary artery 11/26/2010  . Essential hypertension, benign 11/23/2010  . Type 2 diabetes mellitus ([place]) 11/23/2010  . Hyperlipidemia 11/23/2010  . History of TIAs 11/23/2010    Past Surgical History:  Procedure Laterality Date  . COLONOSCOPY  08/13/2010   Procedure: COLONOSCOPY;  Surgeon: Rogene Houston, MD;  Location: [facility] END[facility] SUITE;  Service: Endoscopy;  Laterality: N/A;  . KNEE SURGERY    . LEFT HEART CATHETERIZATION WITH CORONARY ANGIOGRAM N/A 11/24/2010   Procedure: LEFT HEART CATHETERIZATION WITH CORONARY ANGIOGRAM;  Surgeon: Josue Hector, MD;  Location: [facility] CATH LAB;  Service: Cardiovascular;  Laterality: N/A;       Home Medications    Prior to Admission medications   Medication Sig Start Date End Date Taking? Authorizing Provider  aspirin EC 81 MG EC tablet Take 1 tablet (81 mg total) by mouth daily. 12/04/16   Orson Eva, MD  cetirizine (ZYRTEC) 10 MG tablet Take 10 mg by mouth daily.    [provider]  clopidogrel (PLAVIX) 75 MG tablet Take 1 tablet (75 mg total) by mouth daily. 08/08/12   Lendon Colonel, NP  fenofibrate (TRICOR) 145  MG tablet Take 145 mg by mouth daily.    [provider]  isosorbide mononitrate (IMDUR) 30 MG 24 hr tablet Take 1 tablet by mouth daily. 11/30/16   [provider]  levothyroxine (SYNTHROID, LEVOTHROID) 50 MCG tablet Take 1 tablet by mouth daily. 11/13/16   [provider]  lisinopril-hydrochlorothiazide (PRINZIDE,ZESTORETIC) 20-12.5 MG per tablet Take 1 tablet by mouth 2 (two) times daily.    [provider]  metFORMIN (GLUCOPHAGE) 500 MG tablet Take 500 mg by mouth 2 (two) times daily.    [provider]  metoprolol succinate (TOPROL-XL) 50 MG 24 hr tablet Take 50 mg by mouth daily. Take with or immediately following a meal.    [provider]  Multiple Vitamin (MULTIVITAMIN) tablet Take 1 tablet by mouth daily.      [provider]  nitroGLYCERIN (NITROSTAT) 0.4 MG SL tablet Place 0.4 mg under the tongue every 5 (five) minutes as needed for chest pain.    [provider]  Omega-3 Fatty Acids (OMEGA 3 PO) Take 1,000 mg by mouth daily.     [provider]  pravastatin (PRAVACHOL) 10 MG tablet Take 1 tablet by mouth daily. 09/04/16   [provider]  rOPINIRole (REQUIP) 0.5 MG tablet Take 1 tablet by mouth 2 (two) times daily. 09/15/16   [provider]  Saw Palmetto 450 MG CAPS Take 1 capsule by mouth daily.    [provider]  spironolactone (ALDACTONE) 25 MG tablet Take 1 tablet by mouth daily. 09/28/16   [provider]  tamsulosin (FLOMAX) 0.4 MG CAPS capsule Take 0.4 mg by mouth daily.    [provider]    Family History Family History  Problem Relation Age of Onset  . Coronary artery disease Father   . Coronary artery disease Brother   . Asthma Unknown     Social History Social History   Tobacco Use  . Smoking status: Former Smoker    Types: Cigarettes    Last attempt to quit: 01/13/1980    Years since quitting: 36.9  . Smokeless tobacco: Never Used  Substance Use Topics  . Alcohol use: No  . Drug use: No     Allergies   Patient has no known allergies.   Review of Systems Review of Systems  Neurological: Positive for speech difficulty.     Physical Exam Updated Vital Signs Ht 1.727 m (5\' 8" )   Wt 85.3 kg (188 lb)   BMI 28.59 kg/m   Physical Exam  Constitutional: He is oriented to person, place, and time. He appears well-developed and well-nourished.  HENT:  Head:  Normocephalic and atraumatic.  Right Ear: External ear normal.  Left Ear: External ear normal.  Eyes: EOM are normal. Pupils are equal, round, and reactive to light.  Neck: Normal range of motion. Neck supple.  Cardiovascular: Normal rate and regular rhythm.  Pulmonary/Chest: Effort normal and breath sounds normal.  Abdominal: Soft. Bowel sounds are normal.  Neurological: He is alert and oriented to person, place, and time. He displays normal reflexes. A cranial nerve deficit is present. He exhibits normal muscle tone. Coordination normal.  Right mouth droop and tongue deviates to right Decreased nasolabial fold on right  Skin: Skin is warm. Capillary refill takes less than 2 seconds.  Psychiatric: He has a normal mood and affect. His behavior is normal.  Nursing note and vitals reviewed.    ED Treatments / Results  Labs (all labs ordered are listed, but only  abnormal results are displayed) Labs Reviewed  CBG MONITORING, ED - Abnormal; Notable for the following components:      Result Value   Glucose-Capillary 157 (*)    All other components within normal limits  CBC WITH DIFFERENTIAL/PLATELET  COMPREHENSIVE METABOLIC PANEL  TROPONIN I  CBG MONITORING, ED    EKG  EKG Interpretation  Date/Time:  Thursday December 03 2016 13:10:26 EST Ventricular Rate:  80 PR Interval:    QRS Duration: 166 QT Interval:  443 QTC Calculation: 512 R Axis:   -68 Text Interpretation:  Sinus rhythm Prolonged PR interval Right bundle branch block Confirmed by Virgel Manifold [phone]) on 12/03/2016 1:15:13 PM       Radiology Ct Head Wo Contrast  Result Date: 12/02/2016 CLINICAL DATA:  Slurred speech and altered mental status. Right-sided weakness. Dizziness. EXAM: CT HEAD WITHOUT CONTRAST TECHNIQUE: Contiguous axial images were obtained from the base of the skull through the vertex without intravenous contrast. COMPARISON:  Head CT and brain MRI September 13, 2015 FINDINGS: Brain: There is mild  diffuse atrophy. There is no intracranial mass, hemorrhage, extra-axial fluid collection, or midline shift. There is patchy small vessel disease throughout the centra semiovale bilaterally. There is a prior small infarct in the anterior left thalamus as well as a nearby small infarct in the posterior left lentiform nucleus. Small vessel disease is noted in the external capsule on the left. There is small vessel disease with prior small infarct in the lateral right upper pons. There is no new gray-white compartment lesion. No acute infarct is demonstrable on this study. Vascular: There is no appreciable hyperdense vessel. There is calcification in each carotid siphon region as well as in the distal left vertebral artery. Skull: Bony calvarium appears intact. Sinuses/Orbits: There is mucosal thickening in multiple ethmoid air cells. There is mucosal thickening in the right maxillary antrum. Other visualized paranasal sinuses are clear. Visualized orbits appear symmetric bilaterally. Other: Mastoid air cells are clear. IMPRESSION: Atrophy with extensive supratentorial small vessel disease. Prior small infarcts noted in the anterior left thalamus, posterior left lentiform nucleus, left external capsule, and upper right pons. No acute infarct demonstrable on this study. No intracranial mass, hemorrhage, or extra-axial fluid collection. There are foci of arterial vascular calcification. Foci of paranasal sinus disease noted. Electronically Signed   By: Lowella Grip III M.D.   On: 12/02/2016 12:12   Mr Virgel Paling OQ Contrast  Result Date: 12/02/2016 CLINICAL DATA:  81 year old male with slurred speech and weakness since 1000 hours yesterday. Dizziness, drooling and choking. EXAM: MRI HEAD WITHOUT CONTRAST MRA HEAD WITHOUT CONTRAST TECHNIQUE: Multiplanar, multiecho pulse sequences of the brain and surrounding structures were obtained without intravenous contrast. Angiographic images of the head were obtained using  MRA technique without contrast. COMPARISON:  Head CT without contrast 1158 hours today. Brain MRI and intracranial MRA 09/13/2015. FINDINGS: MRI HEAD FINDINGS Brain: 20 mm curvilinear area of restricted diffusion tracking from the left corona radiata into the posterior left lentiform (Series 6, image 51). Associated mild T2 and FLAIR hyperintensity. There is a separate small area of restricted diffusion along the floor of the left lentiform on series 6, image 54. No associated hemorrhage or mass effect. No other restricted diffusion. Underlying multifocal chronic lacunar infarcts in the bilateral deep gray matter nuclei, the right lateral pons, the left cerebellum, and bilateral cerebral white matter with chronic confluent bilateral white matter T2 and FLAIR hyperintensity. Outside of the acute findings gray and white matter signal is stable. No  chronic cerebral blood products identified. No midline shift, mass effect, evidence of mass lesion, ventriculomegaly, extra-axial collection or acute intracranial hemorrhage. Cervicomedullary junction and pituitary are within normal limits. Vascular: Major intracranial vascular flow voids stable with generalized intracranial artery dolichoectasia. Skull and upper cervical spine: Negative. Visualized bone marrow signal is within normal limits. Sinuses/Orbits: Leftward gaze deviation. Otherwise negative orbits soft tissues. Mild paranasal sinus mucosal thickening is stable. Other: Trace mastoid fluid on the left is increased but appears inconsequential. Visible internal auditory structures appear normal. Scalp and face soft tissues appear negative. MRA HEAD FINDINGS Dominant distal left vertebral artery is stable since 2017 with tortuosity and irregularity but no significant stenosis. Diminutive non dominant distal right vertebral artery is highly irregular and may be occluded proximal to the right PICA origin. The distal right V4 segment to the right PICA may be supplied in a  retrograde fashion. Stable basilar artery with irregularity but no significant stenosis. SCA and PCA origins remain patent and within normal limits. Moderate to severe chronic right P2 segment irregularity and stenosis. Tortuous left P1 segment. Stable PCAs. Stable antegrade flow in both ICA siphons. Bilateral siphon irregularity without significant stenosis. Posterior communicating arteries are diminutive or absent. Patent carotid termini. Stable MCA and ACA origins. The right A1 is dominant. Moderate chronic stenosis at the left ACA origin is stable. Visible ACA branches are within normal limits. Left MCA M1 segment and left MCA bifurcation remain patent with mild irregularity but no stenosis. Increased moderate stenosis in a posterior left MCA M2 branch on series 109, image 10. Stable right MCA and right MCA bifurcation where there is mild irregularity and stenosis. Stable right MCA branches. IMPRESSION: 1. Small acute lacunar infarcts in the left lenticulostriate artery territory. No associated hemorrhage or mass effect. The dominant infarct extends from the left corona radiata to the posterior lentiform. 2. Underlying severe chronic small vessel ischemia is otherwise stable since 2017. 3. 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 - progressed left MCA M2 branch stenosis, now moderate. - moderate to severe chronic stenosis of the right PCA. - chronic mild to moderate stenosis at the left ACA origin and right MCA bifurcation. Electronically Signed   By: Genevie Ann M.D.   On: 12/02/2016 14:33   Mr Brain Wo Contrast  Result Date: 12/02/2016 CLINICAL DATA:  81 year old male with slurred speech and weakness since 1000 hours yesterday. Dizziness, drooling and choking. EXAM: MRI HEAD WITHOUT CONTRAST MRA HEAD WITHOUT CONTRAST TECHNIQUE: Multiplanar, multiecho pulse  sequences of the brain and surrounding structures were obtained without intravenous contrast. Angiographic images of the head were obtained using MRA technique without contrast. COMPARISON:  Head CT without contrast 1158 hours today. Brain MRI and intracranial MRA 09/13/2015. FINDINGS: MRI HEAD FINDINGS Brain: 20 mm curvilinear area of restricted diffusion tracking from the left corona radiata into the posterior left lentiform (Series 6, image 51). Associated mild T2 and FLAIR hyperintensity. There is a separate small area of restricted diffusion along the floor of the left lentiform on series 6, image 54. No associated hemorrhage or mass effect. No other restricted diffusion. Underlying multifocal chronic lacunar infarcts in the bilateral deep gray matter nuclei, the right lateral pons, the left cerebellum, and bilateral cerebral white matter with chronic confluent bilateral white matter T2 and FLAIR hyperintensity. Outside of the acute findings gray and white matter signal is stable. No chronic cerebral blood products  identified. No midline shift, mass effect, evidence of mass lesion, ventriculomegaly, extra-axial collection or acute intracranial hemorrhage. Cervicomedullary junction and pituitary are within normal limits. Vascular: Major intracranial vascular flow voids stable with generalized intracranial artery dolichoectasia. Skull and upper cervical spine: Negative. Visualized bone marrow signal is within normal limits. Sinuses/Orbits: Leftward gaze deviation. Otherwise negative orbits soft tissues. Mild paranasal sinus mucosal thickening is stable. Other: Trace mastoid fluid on the left is increased but appears inconsequential. Visible internal auditory structures appear normal. Scalp and face soft tissues appear negative. MRA HEAD FINDINGS Dominant distal left vertebral artery is stable since 2017 with tortuosity and irregularity but no significant stenosis. Diminutive non dominant distal right vertebral  artery is highly irregular and may be occluded proximal to the right PICA origin. The distal right V4 segment to the right PICA may be supplied in a retrograde fashion. Stable basilar artery with irregularity but no significant stenosis. SCA and PCA origins remain patent and within normal limits. Moderate to severe chronic right P2 segment irregularity and stenosis. Tortuous left P1 segment. Stable PCAs. Stable antegrade flow in both ICA siphons. Bilateral siphon irregularity without significant stenosis. Posterior communicating arteries are diminutive or absent. Patent carotid termini. Stable MCA and ACA origins. The right A1 is dominant. Moderate chronic stenosis at the left ACA origin is stable. Visible ACA branches are within normal limits. Left MCA M1 segment and left MCA bifurcation remain patent with mild irregularity but no stenosis. Increased moderate stenosis in a posterior left MCA M2 branch on series 109, image 10. Stable right MCA and right MCA bifurcation where there is mild irregularity and stenosis. Stable right MCA branches. IMPRESSION: 1. Small acute lacunar infarcts in the left lenticulostriate artery territory. No associated hemorrhage or mass effect. The dominant infarct extends from the left corona radiata to the posterior lentiform. 2. Underlying severe chronic small vessel ischemia is otherwise stable since 2017. 3. 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 - progressed left MCA M2 branch stenosis, now moderate. - moderate to severe chronic stenosis of the right PCA. - chronic mild to moderate stenosis at the left ACA origin and right MCA bifurcation. Electronically Signed   By: Genevie Ann M.D.   On: 12/02/2016 14:33   US Carotid Bilateral (at [facility] Only)  Result Date: 12/02/2016 CLINICAL DATA:  Stroke. EXAM: BILATERAL CAROTID DUPLEX  ULTRASOUND TECHNIQUE: Pearline Cables scale imaging, color Doppler and duplex ultrasound were performed of bilateral carotid and vertebral arteries in the neck. COMPARISON:  09/13/2015 FINDINGS: Criteria: Quantification of carotid stenosis is based on velocity parameters that correlate the residual internal carotid diameter with NASCET-based stenosis levels, using the diameter of the distal internal carotid lumen as the denominator for stenosis measurement. The following velocity measurements were obtained: RIGHT ICA:  169/23 cm/sec CCA:  02/72 cm/sec SYSTOLIC ICA/CCA RATIO:  2.1 DIASTOLIC ICA/CCA RATIO:  1.5 ECA:  121 cm/sec LEFT ICA:  104/38 cm/sec CCA:  53/66 cm/sec SYSTOLIC ICA/CCA RATIO:  1.1 DIASTOLIC ICA/CCA RATIO:  2.4 ECA:  149 cm/sec RIGHT CAROTID ARTERY: Diffuse atheromatous wall thickening of the common carotid. Calcified and noncalcified plaque at the bifurcation. Low resistance waveform. RIGHT VERTEBRAL ARTERY:  Antegrade flow. LEFT CAROTID ARTERY: Diffuse atheromatous wall thickening of the common carotid. Predominately calcified moderate to bulky plaque at the bifurcation. LEFT VERTEBRAL ARTERY:  Antegrade flow. IMPRESSION: 1. Diffuse atherosclerotic plaque in the cervical carotids. 2. Elevated  right ICA velocities in the lower end of the 50-69% stenotic range. 3. Less than 50% left ICA stenosis. 4. Antegrade flow in both vertebral arteries. Electronically Signed   By: Monte Fantasia M.D.   On: 12/02/2016 19:08    Procedures Procedures (including critical care time)  Medications Ordered in ED Medications - No data to display   Initial Impression / Assessment and Plan / ED Course  I have reviewed the triage vital signs and the nursing notes.  Pertinent labs & imaging results that were available during my care of the patient were reviewed by me and considered in my medical decision making (see chart for details).     1- cva- question worsening symptoms-no bleeding noted on CT of head. 2-  syncope-patient appears stable here.  Pain is negative and is in a sinus rhythm. Hospitalist service paged for readmission  Final Clinical Impressions(s) / ED Diagnoses   Final diagnoses:  Syncope, unspecified syncope type  Cerebrovascular accident (CVA), unspecified mechanism [facility])    ED Discharge Orders    None       Pattricia Boss, MD 12/03/16 2140

## 2016-12-03 NOTE — ED Triage Notes (Signed)
Pt was d/c from hospital this morning following a stroke.  Was eating lunch and became unresponsive per family.  Pt was responsive on ems arrival, but was disoriented and very diaphoretic.  Pt states he remembers eating lunch and getting hot.

## 2016-12-04 ENCOUNTER — Inpatient Hospital Stay (HOSPITAL_COMMUNITY): Payer: MEDICARE

## 2016-12-04 DIAGNOSIS — I361 Nonrheumatic tricuspid (valve) insufficiency: Secondary | ICD-10-CM | POA: Diagnosis not present

## 2016-12-04 DIAGNOSIS — I639 Cerebral infarction, unspecified: Secondary | ICD-10-CM | POA: Diagnosis not present

## 2016-12-04 DIAGNOSIS — I10 Essential (primary) hypertension: Secondary | ICD-10-CM | POA: Diagnosis not present

## 2016-12-04 DIAGNOSIS — R55 Syncope and collapse: Secondary | ICD-10-CM | POA: Diagnosis not present

## 2016-12-04 LAB — COMPREHENSIVE METABOLIC PANEL WITH GFR
ALT: 24 U/L (ref 17–63)
AST: 18 U/L (ref 15–41)
Albumin: 3.8 g/dL (ref 3.5–5.0)
Alkaline Phosphatase: 34 U/L — ABNORMAL LOW (ref 38–126)
Anion gap: 5 (ref 5–15)
BUN: 22 mg/dL — ABNORMAL HIGH (ref 6–20)
CO2: 22 mmol/L (ref 22–32)
Calcium: 8.9 mg/dL (ref 8.9–10.3)
Chloride: 112 mmol/L — ABNORMAL HIGH (ref 101–111)
Creatinine, Ser: 0.84 mg/dL (ref 0.61–1.24)
GFR calc Af Amer: >60 mL/min
GFR calc non Af Amer: >60 mL/min
Glucose, Bld: 131 mg/dL — ABNORMAL HIGH (ref 65–99)
Potassium: 3.2 mmol/L — ABNORMAL LOW (ref 3.5–5.1)
Sodium: 139 mmol/L (ref 135–145)
Total Bilirubin: 0.8 mg/dL (ref 0.3–1.2)
Total Protein: 6.4 g/dL — ABNORMAL LOW (ref 6.5–8.1)

## 2016-12-04 LAB — ECHOCARDIOGRAM COMPLETE
E decel time: 465 ms
E/e' ratio: 9.93
FS: 25 % — AB (ref 28–44)
Height: 68 in
IVS/LV PW RATIO, ED: 1.18
LA ID, A-P, ES: 37 mm
LA diam end sys: 37 mm
LA diam index: 1.86 cm/m2
LA vol A4C: 67 mL
LA vol index: 23.1 mL/m2
LA vol: 45.9 mL
LV E/e' medial: 9.93
LV E/e'average: 9.93
LV PW d: 12.2 mm — AB (ref 0.6–1.1)
LV e' LATERAL: 7.62 cm/s
LVOT area: 3.46 cm2
LVOT diameter: 21 mm
Lateral S' vel: 16 cm/s
MV Dec: 465
MV Peak grad: 2 mmHg
MV pk A vel: 85.9 m/s
MV pk E vel: 75.7 m/s
TAPSE: 31.1 mm
TDI e' lateral: 7.62
TDI e' medial: 5.87
Weight: 3008 [oz_av]

## 2016-12-04 LAB — CBC
HCT: 37.4 % — ABNORMAL LOW (ref 39.0–52.0)
HEMOGLOBIN: 12.3 g/dL — AB (ref 13.0–17.0)
MCH: 27.3 pg (ref 26.0–34.0)
MCHC: 32.9 g/dL (ref 30.0–36.0)
MCV: 83.1 fL (ref 78.0–100.0)
PLATELETS: 234 10*3/uL (ref 150–400)
RBC: 4.5 MIL/uL (ref 4.22–5.81)
RDW: 14.3 % (ref 11.5–15.5)
WBC: 4 10*3/uL (ref 4.0–10.5)

## 2016-12-04 LAB — GLUCOSE, CAPILLARY
Glucose-Capillary: 108 mg/dL — ABNORMAL HIGH (ref 65–99)
Glucose-Capillary: 157 mg/dL — ABNORMAL HIGH (ref 65–99)
Glucose-Capillary: 102 mg/dL — ABNORMAL HIGH (ref 65–99)
Glucose-Capillary: 140 mg/dL — ABNORMAL HIGH (ref 65–99)

## 2016-12-04 LAB — TROPONIN I: Troponin I: <0.03 ng/mL

## 2016-12-04 MED ORDER — SODIUM CHLORIDE 0.9 % IV SOLN
INTRAVENOUS | Status: AC
  Administered 2016-12-04: 15:00:00 via INTRAVENOUS

## 2016-12-04 MED ORDER — HYDRALAZINE HCL 20 MG/ML IJ SOLN
10.0000 mg | Freq: Four times a day (QID) | INTRAMUSCULAR | Status: DC | PRN
  Administered 2016-12-04: 10 mg via INTRAVENOUS
  Filled 2016-12-04: qty 1

## 2016-12-04 MED ORDER — POTASSIUM CHLORIDE CRYS ER 20 MEQ PO TBCR
20.0000 meq | EXTENDED_RELEASE_TABLET | Freq: Two times a day (BID) | ORAL | Status: AC
  Administered 2016-12-04 (×2): 20 meq via ORAL
  Filled 2016-12-04 (×2): qty 1

## 2016-12-04 MED ORDER — LISINOPRIL 10 MG PO TABS
10.0000 mg | ORAL_TABLET | Freq: Every day | ORAL | Status: DC
  Administered 2016-12-04 – 2016-12-05 (×2): 10 mg via ORAL
  Filled 2016-12-04 (×2): qty 1

## 2016-12-04 NOTE — Plan of Care (Signed)
  Acute Rehab PT Goals(only PT should resolve) Pt Will Ambulate 12/04/2016 0935 - Progressing by Lonell Grandchild, PT Flowsheets Taken 12/04/2016 0935  Pt will Ambulate > 125 feet;Independently Pt Will Go Up/Down Stairs 12/04/2016 0935 - Progressing by Lonell Grandchild, PT Flowsheets Taken 12/04/2016 0935  Pt will Go Up / Down Stairs 6-9 stairs;with modified independence;with rail(s)   9:36 AM, 12/04/16 Lonell Grandchild, MPT Physical Therapist with [facility] [phone] office [phone] mobile phone

## 2016-12-04 NOTE — Care Management Important Message (Signed)
Important Message  Patient Details  Name: Steve Ramirez MRN: [id number] Date of Birth: 1935-03-07   Medicare Important Message Given:  Yes    Sherald Barge, RN 12/04/2016, 2:34 PM

## 2016-12-04 NOTE — Progress Notes (Signed)
*  PRELIMINARY RESULTS* Echocardiogram 2D Echocardiogram has been performed.  Steve Ramirez 12/04/2016, 2:01 PM

## 2016-12-04 NOTE — Evaluation (Addendum)
Occupational Therapy Evaluation Patient Details Name: Steve Ramirez MRN: [id number] DOB: November 16, 1935 Today's Date: 12/04/2016    History of Present Illness 81 y.o.malewith medical history ofright pontine stroke with residual right lower extremity weakness, hypertension, coronary artery disease, diabetes mellitus,and diverticular GI bleed presented with slurred speech,dysphagiaand drooling that began around 8 AM on December 01, 2016. The patient denied any new right upper extremity or right lower extremity weakness, but stated that while he was walking on December 02, 2016, he kept drifting to his right. In addition, the patient states that his dysarthria worsened on the morning of December 02, 2016.The patient also has some drooling on the right side of his mouth with worsening dysphagia type symptoms. As a result, the patient presented to emergency department for further evaluation. The patient's deficits remained stable/improved after overnight observation. The patient did not want any further workup during hospitalization including echo and CTA and was discharged earlier today . After patient was discharged around 11:30 when the patient started on the dining table to eat, family noticed the patient slumped over to the right, started drooling with food in his mouth. He was semi-conscious for about 10-15 minutes, would intermittently chew his food but would not wake up. Son called EMS. Son is not sure that the patient had low blood pressure during this episode   Clinical Impression   Pt received at EOB, agreeable to OT evaluation, family present. Pt presents at baseline with ADL completion and functional mobility. Pt with RUE deficits from previous CVA and torn rotator cuff. No change from functional status on 11/21. No OT services required at this time.     Follow Up Recommendations  No OT follow up    Equipment Recommendations  None recommended by OT       Precautions /  Restrictions Precautions Precautions: Fall Restrictions Weight Bearing Restrictions: No      Mobility Bed Mobility Overal bed mobility: Independent             General bed mobility comments: Seated at EOB on OT arrival  Transfers Overall transfer level: Modified independent Equipment used: None             General transfer comment: sit to stand/stand to sit, transferring to commode        ADL either performed or assessed with clinical judgement   ADL Overall ADL's : Independent;At baseline                                             Vision Baseline Vision/History: Wears glasses Wears Glasses: At all times Patient Visual Report: No change from baseline Vision Assessment?: No apparent visual deficits            Pertinent Vitals/Pain Pain Assessment: No/denies pain     Hand Dominance Right   Extremity/Trunk Assessment Upper Extremity Assessment Upper Extremity Assessment: Overall WFL for tasks assessed(RUE weakness (4/5) due to prior CVA and torn RC)   Lower Extremity Assessment Lower Extremity Assessment: Defer to PT evaluation   Cervical / Trunk Assessment Cervical / Trunk Assessment: Normal   Communication Communication Communication: Expressive difficulties   Cognition Arousal/Alertness: Awake/alert Behavior During Therapy: WFL for tasks assessed/performed Overall Cognitive Status: Within Functional Limits for tasks assessed  Home Living Family/patient expects to be discharged to:: Private residence Living Arrangements: Spouse/significant other Available Help at Discharge: Family;Available 24 hours/day Type of Home: House Home Access: Stairs to enter [employer] of Steps: 5 steps with railing on both sides Entrance Stairs-Rails: Right;Left;Can reach both Home Layout: One level     Bathroom Shower/Tub: Walk-in shower;Other (comment)    Bathroom Toilet: Handicapped height     Home Equipment: Walker - 2 wheels;Cane - single point;Shower seat - built in;Hand held shower head;Wheelchair - manual          Prior Functioning/Environment Level of Independence: Independent                    12-07-16 0925  OT G-codes **NOT FOR INPATIENT CLASS**  Functional Assessment Tool Used AM-PAC 6 Clicks Daily Activity  Functional Limitation Self care  Self Care [facility] Status ([facility]88) [facility]  Self Care Goal Status (K8003) [facility]  Self Care Discharge Status (K9179) [place]        OT Problem List: Decreased activity tolerance;Decreased strength      OT Treatment/Interventions:      OT Goals(Current goals can be found in the care plan section) Acute Rehab OT Goals Patient Stated Goal: return home  OT Frequency:      End of Session    Activity Tolerance: Patient tolerated treatment well Patient left: in bed;with call bell/phone within reach;with family/visitor present  OT Visit Diagnosis: Muscle weakness (generalized) (M62.81)                Time: 1505-6979 OT Time Calculation (min): 21 min Charges:  OT General Charges $OT Visit: 1 Visit OT Evaluation $OT Eval Low Complexity: 1 Low G-CodesGuadelupe Sabin, OTR/L  [phone] 2016/12/07, 10:01 AM

## 2016-12-04 NOTE — Evaluation (Signed)
Physical Therapy Evaluation Patient Details Name: Steve Ramirez MRN: [id number] DOB: [birth year] Today's Date: 12/04/2016   History of Present Illness  81 y.o.malewith medical history ofright pontine stroke with residual right lower extremity weakness, hypertension, coronary artery disease, diabetes mellitus,and diverticular GI bleed presented with slurred speech,dysphagiaand drooling that began around 8 AM on December 01, 2016. The patient denied any new right upper extremity or right lower extremity weakness, but stated that while he was walking on December 02, 2016, he kept drifting to his right. In addition, the patient states that his dysarthria worsened on the morning of December 02, 2016.The patient also has some drooling on the right side of his mouth with worsening dysphagia type symptoms. As a result, the patient presented to emergency department for further evaluation. The patient's deficits remained stable/improved after overnight observation. The patient did not want any further workup during hospitalization including echo and CTA and was discharged earlier today . After patient was discharged around 11:30 when the patient started on the dining table to eat, family noticed the patient slumped over to the right, started drooling with food in his mouth. He was semi-conscious for about 10-15 minutes, would intermittently chew his food but would not wake up. Son called EMS. Son is not sure that the patient had low blood pressure during this episode    Clinical Impression  Patient functioning near baseline for functional mobility/gait and well aware of limitations having to slow cadence and make multiple steps for turns without loss of balance.  Patient will benefit from continued physical therapy in hospital with focus on gait/stair training.    Follow Up Recommendations No PT follow up;Supervision for mobility/OOB    Equipment Recommendations  None recommended by PT     Recommendations for Other Services       Precautions / Restrictions Precautions Precautions: Fall Restrictions Weight Bearing Restrictions: No      Mobility  Bed Mobility Overal bed mobility: Independent                Transfers Overall transfer level: Modified independent               General transfer comment: sit to stand/stand to sit, transferring to commode  Ambulation/Gait Ambulation/Gait assistance: Supervision Ambulation Distance (Feet): 120 Feet Assistive device: None Gait Pattern/deviations: WFL(Within Functional Limits)   Gait velocity interpretation: Below normal speed for age/gender General Gait Details: grossly WFL except slower than normal cadence and has turn slowly when making 180 degree turns to avoid loss of balance/falls - baseline since September 2017 per patient  Stairs            Wheelchair Mobility    Modified Rankin (Stroke Patients Only)       Balance Overall balance assessment: No apparent balance deficits (not formally assessed)                                           Pertinent Vitals/Pain Pain Assessment: No/denies pain    Home Living Family/patient expects to be discharged to:: Private residence Living Arrangements: Spouse/significant other Available Help at Discharge: Family Type of Home: House Home Access: Stairs to enter Entrance Stairs-Rails: Right;Left;Can reach both Entrance Stairs-Number of Steps: 5 steps with railing on both sides Home Layout: One level Home Equipment: Walker - 2 wheels;Cane - single point;Shower seat - built in;Hand held shower head;Wheelchair - manual  Prior Function Level of Independence: Independent               Hand Dominance        Extremity/Trunk Assessment   Upper Extremity Assessment Upper Extremity Assessment: Defer to OT evaluation    Lower Extremity Assessment Lower Extremity Assessment: Overall WFL for tasks assessed     Cervical / Trunk Assessment Cervical / Trunk Assessment: Normal  Communication   Communication: Expressive difficulties  Cognition Arousal/Alertness: Awake/alert Behavior During Therapy: WFL for tasks assessed/performed Overall Cognitive Status: Within Functional Limits for tasks assessed                                        General Comments      Exercises     Assessment/Plan    PT Assessment Patient needs continued PT services  PT Problem List Decreased activity tolerance;Decreased balance;Decreased mobility;Decreased strength       PT Treatment Interventions Gait training;Stair training;Functional mobility training;Therapeutic activities;Therapeutic exercise;Patient/family education    PT Goals (Current goals can be found in the Care Plan section)  Acute Rehab PT Goals Patient Stated Goal: return home PT Goal Formulation: With patient Time For Goal Achievement: 12/07/16 Potential to Achieve Goals: Good    Frequency 7X/week   Barriers to discharge        Co-evaluation               AM-PAC PT "6 Clicks" Daily Activity  Outcome Measure Difficulty turning over in bed (including adjusting bedclothes, sheets and blankets)?: None Difficulty moving from lying on back to sitting on the side of the bed? : None Difficulty sitting down on and standing up from a chair with arms (e.g., wheelchair, bedside commode, etc,.)?: None Help needed moving to and from a bed to chair (including a wheelchair)?: None Help needed walking in hospital room?: None Help needed climbing 3-5 steps with a railing? : A Little 6 Click Score: 23    End of Session   Activity Tolerance: Patient tolerated treatment well Patient left: in bed;with call bell/phone within reach(seated at bedside to eat breakfast) Nurse Communication: Mobility status PT Visit Diagnosis: Unsteadiness on feet (R26.81);Other abnormalities of gait and mobility (R26.89);Muscle weakness (generalized)  (M62.81)    Time: 6389-3734 PT Time Calculation (min) (ACUTE ONLY): 25 min   Charges:   PT Evaluation $PT Eval Low Complexity: 1 Low PT Treatments $Therapeutic Activity: 23-37 mins   PT G Codes:   PT G-Codes **NOT FOR INPATIENT CLASS** Functional Assessment Tool Used: AM-PAC 6 Clicks Basic Mobility Functional Limitation: Mobility: Walking and moving around Mobility: Walking and Moving Around Current Status (K8768): At least 1 percent but less than 20 percent impaired, limited or restricted Mobility: Walking and Moving Around Goal Status [phone]): At least 1 percent but less than 20 percent impaired, limited or restricted Mobility: Walking and Moving Around Discharge Status [phone]): At least 1 percent but less than 20 percent impaired, limited or restricted    9:33 AM, 12/04/16 Lonell Grandchild, MPT Physical Therapist with [facility] [phone] office [phone] mobile phone

## 2016-12-04 NOTE — Progress Notes (Signed)
[facility] PROGRESS NOTE  Steve Ramirez JJO:[id number] DOB: 23-Feb-1935 DOA: 12/03/2016 PCP: Sharilyn Sites, MD  Brief summary   81 y.o.malewith medical history ofright pontine stroke with residual right lower extremity weakness, hypertension, coronary artery disease, diabetes mellitus,and diverticular GI bleed presented with slurred speech,dysphagiaand drooling that began around 8 AM on December 01, 2016. he is found to have acute stroke. Apparently, he did not want any further workup during hospitalization including echo and CTA and was discharged on 11/22 . After patient was discharged around 11:30 when the patient started on the dining table to eat, family noticed the patient slumped over to the right, started drooling with food in his mouth. He was semi-conscious for about 10-15 minutes, would intermittently chew his food but would not wake up. Son called EMS. Son is not sure that the patient had low blood pressure during this episode  -ED: blood pressure in the 16W to 109 systolic, creatinine has increased from 1.0-1.28. Case was discussed with Dr. Cheral Marker, who advised to admit the patient to [facility]    Assessment/Plan:  Presyncope prior to admission . Near syncope likely secondary to hypotensive episode, blood pressure was low in ED. Appeared clinically dehydrated with increasing creatinine.  Hydrated with IV fluids.   Acute stroke. Case was discussed with neurology. Dr Max Fickle, and they recommend completing workup. CT brain--neg for acute findings. Recent MRI brain--small acute lacunar infarct lenticulostriate artery territory. MRA brain--progressed left MCA M2 branch stenosis, now moderate. moderate to severe chronic stenosis of the right PCA. chronic mild to moderate stenosis at the left ACA origin and rightMCA bifurcation. Carotid Duplex--50%-69% on right. CTA neck: No acute finding. No vertebral occlusion or insufficiency to explain syncope.Diffuse atherosclerosis  with 30-40% proximal ICA narrowing bilaterally.  Advanced right and mild-to-moderate left V1 segment stenosis. Pend Echo. Neurology recommended antiplatelet--ASA + Plavix. Cont statin   Dysphagia. Obtain speech eval  Essential hypertension. hypotensive on admission and holding lisinopril/HCTZ, Aldactone. Now , hypertensive. Will resume lisinopril. Hold diuretic. adjust meds as needed  Diabetes mellitus type 2. NovoLog sliding scale. Hemoglobin A1c-- 6.9  Hyperlipidemia. Continue statin--hx of intolerance to lipitor-->continue home dose pravastatin. LDL 49  Hypothyroidism. Continue Synthroid  Hypokalemia. Replace, and recheck    Code Status: full Family Communication: d/w patient, RN (indicate person spoken with, relationship, and if by phone, the number) Disposition Plan: home 24-48 hrs    Consultants:  Neuro over the phone   Procedures:  Echo-pend   Antibiotics:  none (indicate start date, and stop date if known)  HPI/Subjective: Alert. Reports feeling better. No dizziness. Residual dysarthria. No new focal sym[ptoms.   Objective: Vitals:   12/04/16 0600 12/04/16 0830  BP: (!) 172/86 (!) 170/110  Pulse: 68 84  Resp: 18 18  Temp: 97.8 F (36.6 C) 97.8 F (36.6 C)  SpO2: 98% 99%    Intake/Output Summary (Last 24 hours) at 12/04/2016 1311 Last data filed at 12/04/2016 1000 Gross per 24 hour  Intake 1660 ml  Output -  Net 1660 ml   Filed Weights   12/03/16 1310 12/03/16 1630  Weight: 85.3 kg (188 lb) 85.3 kg (188 lb)    Exam:   General:  No distress   Cardiovascular: s1,s2 rrr  Respiratory: CTA BL  Abdomen: soft, nt, nd   Musculoskeletal: no leg edema    Data Reviewed: Basic Metabolic Panel: Recent Labs  Lab 12/02/16 1133 12/02/16 1136 12/03/16 0654 12/03/16 1331 12/04/16 0329  NA 137 141 137 138 139  K 3.6  3.7 3.5 4.0 3.2*  CL 102 104 103 104 112*  CO2 27  --  26 24 22   GLUCOSE 105* 102* 132* 168* 131*  BUN 24* 24* 22* 25* 22*   CREATININE 1.02 1.00 1.02 1.28* 0.84  CALCIUM 10.4*  --  9.6 9.7 8.9   Liver Function Tests: Recent Labs  Lab 12/02/16 1133 12/03/16 1331 12/04/16 0329  AST 28 25 18   ALT 33 31 24  ALKPHOS 43 41 34*  BILITOT 1.0 0.9 0.8  PROT 8.0 7.4 6.4*  ALBUMIN 4.8 4.5 3.8   No results for input(s): LIPASE, AMYLASE in the last 168 hours. No results for input(s): AMMONIA in the last 168 hours. CBC: Recent Labs  Lab 12/02/16 1133 12/02/16 1136 12/03/16 1331 12/04/16 0329  WBC 5.3  --  4.5 4.0  NEUTROABS 2.8  --  3.0  --   HGB 14.0 14.6 13.8 12.3*  HCT 44.0 43.0 42.2 37.4*  MCV 83.3  --  82.9 83.1  PLT 293  --  247 234   Cardiac Enzymes: Recent Labs  Lab 12/03/16 1331 12/03/16 1556 12/03/16 2138 12/04/16 0329  TROPONINI <0.03 <0.03 <0.03 <0.03   BNP (last 3 results) No results for input(s): BNP in the last 8760 hours.  ProBNP (last 3 results) No results for input(s): PROBNP in the last 8760 hours.  CBG: Recent Labs  Lab 12/03/16 0735 12/03/16 1309 12/03/16 2027 12/04/16 0733 12/04/16 1106  GLUCAP 116* 157* 144* 108* 157*    No results found for this or any previous visit (from the past 240 hour(s)).   Studies: Ct Angio Head W Or Wo Contrast  Result Date: 12/03/2016 CLINICAL DATA:  "Cerebrovascular malformation, high flow, evaluation." History per the chart is patient passed out at lunch. EXAM: CT ANGIOGRAPHY HEAD AND NECK TECHNIQUE: Multidetector CT imaging of the head and neck was performed using the standard protocol during bolus administration of intravenous contrast. Multiplanar CT image reconstructions and MIPs were obtained to evaluate the vascular anatomy. Carotid stenosis measurements (when applicable) are obtained utilizing NASCET criteria, using the distal internal carotid diameter as the denominator. CONTRAST:  19mL ISOVUE-370 IOPAMIDOL (ISOVUE-370) INJECTION 76% COMPARISON:  Head CT earlier today.  MRA from yesterday. FINDINGS: CTA NECK FINDINGS Aortic  arch: Atherosclerosis.  Three vessel branching. Right carotid system: Diffuse atheromatous wall thickening of the common carotid artery. Primarily noncalcified plaque at the common carotid bifurcation and ICA bulb. Proximal ICA stenosis measures up to 40%. No ulceration. Left carotid system: Diffuse atheromatous wall thickening of the common carotid artery. Bulky calcified and noncalcified plaque at the ICA bulb and proximal segment. Proximal ICA stenosis measures up to 30%. No ulceration or dissection. ICA tortuosity. Vertebral arteries: Proximal subclavian atherosclerosis without flow limiting stenosis. High-grade narrowing at the right V1 segment. Mild narrowing at the dominant left V1 segment, both due to atherosclerotic plaque. There is a mild left V2 segment stenosis. Skeleton: C4-5 and C6-7 ACDF. Severe adjacent segment disc disease. No acute or aggressive finding. Other neck: No incidental mass or adenopathy. Upper chest: No acute finding. Review of the MIP images confirms the above findings CTA HEAD FINDINGS Anterior circulation: Atherosclerotic plaque on both carotid siphons with up to mild luminal narrowing. Moderate left M1 segment stenosis, see axial thick MIPS. This could have progressed since previous MRA as the vessel lumen appears slightly more irregular on coronal MIPS. Bilateral MCA branch atherosclerotic irregularity. Advanced narrowing at the left A1 origin. Negative for aneurysm or branch occlusion. Moderate proximal left P2  branch stenosis marked on 7:94 Posterior circulation: Left dominant vertebral artery. Both picas are patent. Moderate narrowing of the mid basilar. Moderate narrowing at the proximal left P2 segment. Severe stenosis of right proximal P2 and distal P2 segments. Venous sinuses: Patent Anatomic variants: As above Delayed phase: No abnormal intracranial enhancement. Review of the MIP images confirms the above findings IMPRESSION: Neck CTA: 1. No acute finding. No vertebral  occlusion or insufficiency to explain syncope. 2. Diffuse atherosclerosis with 30-40% proximal ICA narrowing bilaterally. 3. Advanced right and mild-to-moderate left V1 segment stenosis. Intracranial CTA: 1. Moderate left M1 segment stenosis. Atheromatous type irregularity of this vessel is more prominent than on MRA yesterday. This could be due to differences in technique or from fluctuating plaque in this patient with a known acute left basal ganglia infarct. 2. Otherwise stable advanced intracranial atherosclerosis as described above. The most severe narrowing involves the right P2 segment and left A1 segment origin. Moderate narrowing of the mid basilar and left P2 segment. Electronically Signed   By: Monte Fantasia M.D.   On: 12/03/2016 17:09   Dg Chest 1 View  Result Date: 12/03/2016 CLINICAL DATA:  Syncope. EXAM: CHEST 1 VIEW COMPARISON:  Chest x-ray 11/22/2010 FINDINGS: The cardiac silhouette, mediastinal and hilar contours are within normal limits given the AP projection and portable technique. Low lung volumes with mild vascular crowding and streaky basilar atelectasis but no infiltrates, edema or effusions. The bony thorax is intact. IMPRESSION: No acute cardiopulmonary findings. Electronically Signed   By: Marijo Sanes M.D.   On: 12/03/2016 14:02   Ct Head Wo Contrast  Result Date: 12/03/2016 CLINICAL DATA:  Altered level of consciousness. Passed out during lunch. EXAM: CT HEAD WITHOUT CONTRAST TECHNIQUE: Contiguous axial images were obtained from the base of the skull through the vertex without intravenous contrast. COMPARISON:  Brain MRI from yesterday FINDINGS: Brain: Known acute infarct in the left basal ganglia and corona radiata. Remote lacunar infarct in the left thalamus. Confluent low-density in the cerebral white matter from chronic small vessel ischemia. No hemorrhage or new infarct. No hydrocephalus or masslike finding. Vascular: Atherosclerotic calcification.  No hyperdense  vessel. Skull: No acute or aggressive finding. Small right parietal subgaleal lipoma. Sinuses/Orbits: Negative IMPRESSION: 1. No acute finding when compared to yesterday. 2. Known acute perforator infarct in the left basal ganglia. 3. Extensive chronic small vessel ischemic injury. Electronically Signed   By: Monte Fantasia M.D.   On: 12/03/2016 14:05   Ct Angio Neck W Or Wo Contrast  Result Date: 12/03/2016 CLINICAL DATA:  "Cerebrovascular malformation, high flow, evaluation." History per the chart is patient passed out at lunch. EXAM: CT ANGIOGRAPHY HEAD AND NECK TECHNIQUE: Multidetector CT imaging of the head and neck was performed using the standard protocol during bolus administration of intravenous contrast. Multiplanar CT image reconstructions and MIPs were obtained to evaluate the vascular anatomy. Carotid stenosis measurements (when applicable) are obtained utilizing NASCET criteria, using the distal internal carotid diameter as the denominator. CONTRAST:  172mL ISOVUE-370 IOPAMIDOL (ISOVUE-370) INJECTION 76% COMPARISON:  Head CT earlier today.  MRA from yesterday. FINDINGS: CTA NECK FINDINGS Aortic arch: Atherosclerosis.  Three vessel branching. Right carotid system: Diffuse atheromatous wall thickening of the common carotid artery. Primarily noncalcified plaque at the common carotid bifurcation and ICA bulb. Proximal ICA stenosis measures up to 40%. No ulceration. Left carotid system: Diffuse atheromatous wall thickening of the common carotid artery. Bulky calcified and noncalcified plaque at the ICA bulb and proximal segment. Proximal ICA stenosis  measures up to 30%. No ulceration or dissection. ICA tortuosity. Vertebral arteries: Proximal subclavian atherosclerosis without flow limiting stenosis. High-grade narrowing at the right V1 segment. Mild narrowing at the dominant left V1 segment, both due to atherosclerotic plaque. There is a mild left V2 segment stenosis. Skeleton: C4-5 and C6-7 ACDF.  Severe adjacent segment disc disease. No acute or aggressive finding. Other neck: No incidental mass or adenopathy. Upper chest: No acute finding. Review of the MIP images confirms the above findings CTA HEAD FINDINGS Anterior circulation: Atherosclerotic plaque on both carotid siphons with up to mild luminal narrowing. Moderate left M1 segment stenosis, see axial thick MIPS. This could have progressed since previous MRA as the vessel lumen appears slightly more irregular on coronal MIPS. Bilateral MCA branch atherosclerotic irregularity. Advanced narrowing at the left A1 origin. Negative for aneurysm or branch occlusion. Moderate proximal left P2 branch stenosis marked on 7:94 Posterior circulation: Left dominant vertebral artery. Both picas are patent. Moderate narrowing of the mid basilar. Moderate narrowing at the proximal left P2 segment. Severe stenosis of right proximal P2 and distal P2 segments. Venous sinuses: Patent Anatomic variants: As above Delayed phase: No abnormal intracranial enhancement. Review of the MIP images confirms the above findings IMPRESSION: Neck CTA: 1. No acute finding. No vertebral occlusion or insufficiency to explain syncope. 2. Diffuse atherosclerosis with 30-40% proximal ICA narrowing bilaterally. 3. Advanced right and mild-to-moderate left V1 segment stenosis. Intracranial CTA: 1. Moderate left M1 segment stenosis. Atheromatous type irregularity of this vessel is more prominent than on MRA yesterday. This could be due to differences in technique or from fluctuating plaque in this patient with a known acute left basal ganglia infarct. 2. Otherwise stable advanced intracranial atherosclerosis as described above. The most severe narrowing involves the right P2 segment and left A1 segment origin. Moderate narrowing of the mid basilar and left P2 segment. Electronically Signed   By: Monte Fantasia M.D.   On: 12/03/2016 17:09   Mr Jodene Nam Head Wo Contrast  Result Date:  12/02/2016 CLINICAL DATA:  81 year old male with slurred speech and weakness since 1000 hours yesterday. Dizziness, drooling and choking. EXAM: MRI HEAD WITHOUT CONTRAST MRA HEAD WITHOUT CONTRAST TECHNIQUE: Multiplanar, multiecho pulse sequences of the brain and surrounding structures were obtained without intravenous contrast. Angiographic images of the head were obtained using MRA technique without contrast. COMPARISON:  Head CT without contrast 1158 hours today. Brain MRI and intracranial MRA 09/13/2015. FINDINGS: MRI HEAD FINDINGS Brain: 20 mm curvilinear area of restricted diffusion tracking from the left corona radiata into the posterior left lentiform (Series 6, image 51). Associated mild T2 and FLAIR hyperintensity. There is a separate small area of restricted diffusion along the floor of the left lentiform on series 6, image 54. No associated hemorrhage or mass effect. No other restricted diffusion. Underlying multifocal chronic lacunar infarcts in the bilateral deep gray matter nuclei, the right lateral pons, the left cerebellum, and bilateral cerebral white matter with chronic confluent bilateral white matter T2 and FLAIR hyperintensity. Outside of the acute findings gray and white matter signal is stable. No chronic cerebral blood products identified. No midline shift, mass effect, evidence of mass lesion, ventriculomegaly, extra-axial collection or acute intracranial hemorrhage. Cervicomedullary junction and pituitary are within normal limits. Vascular: Major intracranial vascular flow voids stable with generalized intracranial artery dolichoectasia. Skull and upper cervical spine: Negative. Visualized bone marrow signal is within normal limits. Sinuses/Orbits: Leftward gaze deviation. Otherwise negative orbits soft tissues. Mild paranasal sinus mucosal thickening is stable. Other:  Trace mastoid fluid on the left is increased but appears inconsequential. Visible internal auditory structures appear  normal. Scalp and face soft tissues appear negative. MRA HEAD FINDINGS Dominant distal left vertebral artery is stable since 2017 with tortuosity and irregularity but no significant stenosis. Diminutive non dominant distal right vertebral artery is highly irregular and may be occluded proximal to the right PICA origin. The distal right V4 segment to the right PICA may be supplied in a retrograde fashion. Stable basilar artery with irregularity but no significant stenosis. SCA and PCA origins remain patent and within normal limits. Moderate to severe chronic right P2 segment irregularity and stenosis. Tortuous left P1 segment. Stable PCAs. Stable antegrade flow in both ICA siphons. Bilateral siphon irregularity without significant stenosis. Posterior communicating arteries are diminutive or absent. Patent carotid termini. Stable MCA and ACA origins. The right A1 is dominant. Moderate chronic stenosis at the left ACA origin is stable. Visible ACA branches are within normal limits. Left MCA M1 segment and left MCA bifurcation remain patent with mild irregularity but no stenosis. Increased moderate stenosis in a posterior left MCA M2 branch on series 109, image 10. Stable right MCA and right MCA bifurcation where there is mild irregularity and stenosis. Stable right MCA branches. IMPRESSION: 1. Small acute lacunar infarcts in the left lenticulostriate artery territory. No associated hemorrhage or mass effect. The dominant infarct extends from the left corona radiata to the posterior lentiform. 2. Underlying severe chronic small vessel ischemia is otherwise stable since 2017. 3. 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 - progressed left MCA M2 branch stenosis, now moderate. - moderate to severe chronic stenosis of the right PCA. - chronic mild to moderate stenosis at the  left ACA origin and right MCA bifurcation. Electronically Signed   By: Genevie Ann M.D.   On: 12/02/2016 14:33   Mr Brain Wo Contrast  Result Date: 12/02/2016 CLINICAL DATA:  81 year old male with slurred speech and weakness since 1000 hours yesterday. Dizziness, drooling and choking. EXAM: MRI HEAD WITHOUT CONTRAST MRA HEAD WITHOUT CONTRAST TECHNIQUE: Multiplanar, multiecho pulse sequences of the brain and surrounding structures were obtained without intravenous contrast. Angiographic images of the head were obtained using MRA technique without contrast. COMPARISON:  Head CT without contrast 1158 hours today. Brain MRI and intracranial MRA 09/13/2015. FINDINGS: MRI HEAD FINDINGS Brain: 20 mm curvilinear area of restricted diffusion tracking from the left corona radiata into the posterior left lentiform (Series 6, image 51). Associated mild T2 and FLAIR hyperintensity. There is a separate small area of restricted diffusion along the floor of the left lentiform on series 6, image 54. No associated hemorrhage or mass effect. No other restricted diffusion. Underlying multifocal chronic lacunar infarcts in the bilateral deep gray matter nuclei, the right lateral pons, the left cerebellum, and bilateral cerebral white matter with chronic confluent bilateral white matter T2 and FLAIR hyperintensity. Outside of the acute findings gray and white matter signal is stable. No chronic cerebral blood products identified. No midline shift, mass effect, evidence of mass lesion, ventriculomegaly, extra-axial collection or acute intracranial hemorrhage. Cervicomedullary junction and pituitary are within normal limits. Vascular: Major intracranial vascular flow voids stable with generalized intracranial artery dolichoectasia. Skull and upper cervical spine: Negative. Visualized bone marrow signal is within normal limits. Sinuses/Orbits: Leftward gaze deviation. Otherwise negative orbits soft tissues. Mild paranasal sinus mucosal  thickening is stable. Other: Trace mastoid fluid  on the left is increased but appears inconsequential. Visible internal auditory structures appear normal. Scalp and face soft tissues appear negative. MRA HEAD FINDINGS Dominant distal left vertebral artery is stable since 2017 with tortuosity and irregularity but no significant stenosis. Diminutive non dominant distal right vertebral artery is highly irregular and may be occluded proximal to the right PICA origin. The distal right V4 segment to the right PICA may be supplied in a retrograde fashion. Stable basilar artery with irregularity but no significant stenosis. SCA and PCA origins remain patent and within normal limits. Moderate to severe chronic right P2 segment irregularity and stenosis. Tortuous left P1 segment. Stable PCAs. Stable antegrade flow in both ICA siphons. Bilateral siphon irregularity without significant stenosis. Posterior communicating arteries are diminutive or absent. Patent carotid termini. Stable MCA and ACA origins. The right A1 is dominant. Moderate chronic stenosis at the left ACA origin is stable. Visible ACA branches are within normal limits. Left MCA M1 segment and left MCA bifurcation remain patent with mild irregularity but no stenosis. Increased moderate stenosis in a posterior left MCA M2 branch on series 109, image 10. Stable right MCA and right MCA bifurcation where there is mild irregularity and stenosis. Stable right MCA branches. IMPRESSION: 1. Small acute lacunar infarcts in the left lenticulostriate artery territory. No associated hemorrhage or mass effect. The dominant infarct extends from the left corona radiata to the posterior lentiform. 2. Underlying severe chronic small vessel ischemia is otherwise stable since 2017. 3. 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 -  progressed left MCA M2 branch stenosis, now moderate. - moderate to severe chronic stenosis of the right PCA. - chronic mild to moderate stenosis at the left ACA origin and right MCA bifurcation. Electronically Signed   By: Genevie Ann M.D.   On: 12/02/2016 14:33   US Carotid Bilateral (at [facility] And [facility] Only)  Result Date: 12/02/2016 CLINICAL DATA:  Stroke. EXAM: BILATERAL CAROTID DUPLEX ULTRASOUND TECHNIQUE: Pearline Cables scale imaging, color Doppler and duplex ultrasound were performed of bilateral carotid and vertebral arteries in the neck. COMPARISON:  09/13/2015 FINDINGS: Criteria: Quantification of carotid stenosis is based on velocity parameters that correlate the residual internal carotid diameter with NASCET-based stenosis levels, using the diameter of the distal internal carotid lumen as the denominator for stenosis measurement. The following velocity measurements were obtained: RIGHT ICA:  169/23 cm/sec CCA:  13/24 cm/sec SYSTOLIC ICA/CCA RATIO:  2.1 DIASTOLIC ICA/CCA RATIO:  1.5 ECA:  121 cm/sec LEFT ICA:  104/38 cm/sec CCA:  40/10 cm/sec SYSTOLIC ICA/CCA RATIO:  1.1 DIASTOLIC ICA/CCA RATIO:  2.4 ECA:  149 cm/sec RIGHT CAROTID ARTERY: Diffuse atheromatous wall thickening of the common carotid. Calcified and noncalcified plaque at the bifurcation. Low resistance waveform. RIGHT VERTEBRAL ARTERY:  Antegrade flow. LEFT CAROTID ARTERY: Diffuse atheromatous wall thickening of the common carotid. Predominately calcified moderate to bulky plaque at the bifurcation. LEFT VERTEBRAL ARTERY:  Antegrade flow. IMPRESSION: 1. Diffuse atherosclerotic plaque in the cervical carotids. 2. Elevated right ICA velocities in the lower end of the 50-69% stenotic range. 3. Less than 50% left ICA stenosis. 4. Antegrade flow in both vertebral arteries. Electronically Signed   By: Monte Fantasia M.D.   On: 12/02/2016 19:08    Scheduled Meds: .  stroke: mapping our early stages of recovery book   Does not apply Once  . aspirin  81 mg  Oral Daily  .  clopidogrel  75 mg Oral Daily  . enoxaparin (LOVENOX) injection  40 mg Subcutaneous Q24H  . fenofibrate  54 mg Oral Daily  . insulin aspart  0-9 Units Subcutaneous TID WC  . levothyroxine  50 mcg Oral QAC breakfast  . metoprolol succinate  50 mg Oral Daily  . pravastatin  10 mg Oral Daily  . rOPINIRole  0.5 mg Oral BID   Continuous Infusions: . sodium chloride 100 mL/hr at 12/04/16 7262    Principal Problem:   Near syncope Active Problems:   Essential hypertension, benign   Type 2 diabetes mellitus ([place])   Hyperlipidemia   Acute CVA (cerebrovascular accident) [facility])   Essential hypertension   CVA (cerebral vascular accident) ([place])    Time spent: >35 minutes     Kinnie Feil  [facility] Pager [phone]. If 7PM-7AM, please contact night-coverage at [email or web address], password [facility] 12/04/2016, 1:11 PM  LOS: 1 day

## 2016-12-05 DIAGNOSIS — I10 Essential (primary) hypertension: Secondary | ICD-10-CM

## 2016-12-05 DIAGNOSIS — I639 Cerebral infarction, unspecified: Secondary | ICD-10-CM | POA: Diagnosis not present

## 2016-12-05 DIAGNOSIS — R55 Syncope and collapse: Secondary | ICD-10-CM

## 2016-12-05 DIAGNOSIS — N179 Acute kidney failure, unspecified: Secondary | ICD-10-CM

## 2016-12-05 LAB — BASIC METABOLIC PANEL
Anion gap: 8 (ref 5–15)
BUN: 18 mg/dL (ref 6–20)
CHLORIDE: 110 mmol/L (ref 101–111)
CO2: 21 mmol/L — AB (ref 22–32)
CREATININE: 0.79 mg/dL (ref 0.61–1.24)
Calcium: 9.3 mg/dL (ref 8.9–10.3)
GFR calc Af Amer: >60 mL/min (ref >60)
GFR calc non Af Amer: >60 mL/min (ref >60)
Glucose, Bld: 126 mg/dL — ABNORMAL HIGH (ref 65–99)
Potassium: 3.9 mmol/L (ref 3.5–5.1)
Sodium: 139 mmol/L (ref 135–145)

## 2016-12-05 LAB — GLUCOSE, CAPILLARY
GLUCOSE-CAPILLARY: 170 mg/dL — AB (ref 65–99)
Glucose-Capillary: 108 mg/dL — ABNORMAL HIGH (ref 65–99)

## 2016-12-05 NOTE — Discharge Instructions (Signed)
Stroke Prevention Some medical conditions and behaviors are associated with an increased chance of having a stroke. You may prevent a stroke by making healthy choices and managing medical conditions. How can I reduce my risk of having a stroke?  Stay physically active. Get at least 30 minutes of activity on most or all days.  Do not smoke. It may also be helpful to avoid exposure to secondhand smoke.  Limit alcohol use. Moderate alcohol use is considered to be:  No more than 2 drinks per day for men.  No more than 1 drink per day for nonpregnant women.  Eat healthy foods. This involves:  Eating 5 or more servings of fruits and vegetables a day.  Making dietary changes that address high blood pressure (hypertension), high cholesterol, diabetes, or obesity.  Manage your cholesterol levels.  Making food choices that are high in fiber and low in saturated fat, trans fat, and cholesterol may control cholesterol levels.  Take any prescribed medicines to control cholesterol as directed by your health care provider.  Manage your diabetes.  Controlling your carbohydrate and sugar intake is recommended to manage diabetes.  Take any prescribed medicines to control diabetes as directed by your health care provider.  Control your hypertension.  Making food choices that are low in salt (sodium), saturated fat, trans fat, and cholesterol is recommended to manage hypertension.  Ask your health care provider if you need treatment to lower your blood pressure. Take any prescribed medicines to control hypertension as directed by your health care provider.  If you are 18-39 years of age, have your blood pressure checked every 3-5 years. If you are 40 years of age or older, have your blood pressure checked every year.  Maintain a healthy weight.  Reducing calorie intake and making food choices that are low in sodium, saturated fat, trans fat, and cholesterol are recommended to manage  weight.  Stop drug abuse.  Avoid taking birth control pills.  Talk to your health care provider about the risks of taking birth control pills if you are over 35 years old, smoke, get migraines, or have ever had a blood clot.  Get evaluated for sleep disorders (sleep apnea).  Talk to your health care provider about getting a sleep evaluation if you snore a lot or have excessive sleepiness.  Take medicines only as directed by your health care provider.  For some people, aspirin or blood thinners (anticoagulants) are helpful in reducing the risk of forming abnormal blood clots that can lead to stroke. If you have the irregular heart rhythm of atrial fibrillation, you should be on a blood thinner unless there is a good reason you cannot take them.  Understand all your medicine instructions.  Make sure that other conditions (such as anemia or atherosclerosis) are addressed. Get help right away if:  You have sudden weakness or numbness of the face, arm, or leg, especially on one side of the body.  Your face or eyelid droops to one side.  You have sudden confusion.  You have trouble speaking (aphasia) or understanding.  You have sudden trouble seeing in one or both eyes.  You have sudden trouble walking.  You have dizziness.  You have a loss of balance or coordination.  You have a sudden, severe headache with no known cause.  You have new chest pain or an irregular heartbeat. Any of these symptoms may represent a serious problem that is an emergency. Do not wait to see if the symptoms will go away.   Get medical help at once. Call your local emergency services (911 in [place] Do not drive yourself to the hospital. This information is not intended to replace advice given to you by your health care provider. Make sure you discuss any questions you have with your health care provider. Document Released: 02/06/2004 Document Revised: 06/06/2015 Document Reviewed: 07/01/2012 [employer]  Inter[employer] Patient Education  2017 [employer]  

## 2016-12-05 NOTE — Progress Notes (Signed)
Attempted to place bed alarm on patient for patient safety due to diagnosis of syncope earlier in shift. Patient became very angry and belligerent, not wanting the bed alarm.  Explained to patient that bed alarm was just for safety purposes. Patient stated the alarm was a prison for the bed and he did not want it.  Discussed with 2 charges nurses, patient refused bed alarm during 7pm  Rounding/ shift change.

## 2016-12-05 NOTE — Discharge Summary (Signed)
Physician Discharge Summary  Steve Ramirez RSW:[id number] DOB: [birth year] DOA: 12/03/2016  PCP: Sharilyn Sites, MD  Admit date: 12/03/2016 Discharge date: 12/05/2016  Admitted From: home Disposition:  home  Recommendations for Outpatient Follow-up:  1. Follow up with PCP in 1-2 weeks 2. Follow-up with stroke team at [facility] ( Dr Erlinda Hong) in 6 weeks  Home Health: None.  Outpatient speech therapy Equipment/Devices: None  Discharge Condition: fair CODE STATUS: full code Diet recommendation: Heart Healthy / Carb Modified   Discharge Diagnoses:  Principal Problem:   Near syncope   Active Problems:   Essential hypertension, uncontrolled   Type 2 diabetes mellitus (HCC)   Hyperlipidemia   Acute ischemic stroke (cerebrovascular accident) ([place])  Brief narrative/HPI 81 y.o.malewith medical history ofright pontine stroke with residual right lower extremity weakness, hypertension, coronary artery disease, diabetes mellitus,and diverticular GI bleed presented with slurred speech,dysphagiaand drooling that began around 8 AM on December 01, 2016. he was found to have acute stroke. Apparently, he did not want any further workup during hospitalizationincluding echo and CTA and was discharged on 11/22 .After patient was discharged around 11:30 when the patient started on the dining table to eat, family noticed the patient slumped over to the right, started drooling with food in his mouth. He was semi-conscious for about 10-15 minutes, would intermittently chew his food but would not wake up. Son called EMS. Son is not sure that the patient had low blood pressure during this episode  ED course: blood pressure in the 09F to 818 systolic, creatinine has increased from 1.0-1.28. Case was discussed with Dr. Cheral Marker, who advised to admit the patient to [facility].  Hospital course Near syncope Suspect hypertensive episode as blood pressure was low in the ED.  Also  had mildly elevated creatinine.  He is home diuretics (HCTZ and Aldactone) were held and given IV fluids with improvement. No further symptoms.  Acute ischemic stroke Recent MRI on 11/20 with small acute lacunar infarct in the left lenticular striate artery territory.  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 Carotid Doppler showed 50-69% stenosis on the right.  CT of the neck without acute findings and no vertebral occlusion or insufficiency to explain the syncope.  Has advanced right and mild to moderate left V1 segment stenosis.   2D echo done which showed low normal EF of 50-55% with posterior basal hypokinesis and LVH.Marland Kitchen  After discussing with neuro hospitalist on the phone (no neurology service available at this hospital this week), patient was started on aspirin and Plavix.  Added fibrate for high triglycerides.  Resume home dose pravastatin (LDL of 49).  Intolerant to Lipitor. Seen by speech pathology and recommend outpatient SLP eval.  No functional deficit.  No PT/OT needs. Patient provided resource on secondary stroke prevention.  His blood pressure is elevated and given his acute hypotension and recent stroke allowing permissive hypertension for the next few days and blood pressure medications should be adjusted during outpatient follow-up.  Referral was made to stroke clinic at [facility] neurology during recent hospitalization.  Essential/uncontrolled hypertension Continue Toprol XL, HCTZ-lisinopril and Aldactone.  Adjust blood pressure medications as outpatient.  Counseled on diet and medication adherence.  Dysphagia Outpatient swallow eval.  Diabetes mellitus type 2 A1c of 6.9.  Continue home medications.  Hypokalemia Replenished  Hypothyroidism Continue Synthroid.  Acute kidney injury Mild secondary to dehydration.   Resolved.  Procedure:  CT angiogram of the neck 2D echo  Consults: Discussed with neuro hospitalist on the phone  Family communication: Wife at bedside Disposition: Home   Discharge Instructions   Allergies as of 12/05/2016   No Known Allergies     Medication List    TAKE these medications   aspirin 81 MG EC tablet Take 1 tablet (81 mg total) by mouth daily.   cetirizine 10 MG tablet Commonly known as:  ZYRTEC Take 10 mg by mouth daily.   clopidogrel 75 MG tablet Commonly known as:  PLAVIX Take 1 tablet (75 mg total) by mouth daily.   fenofibrate 145 MG tablet Commonly known as:  TRICOR Take 145 mg by mouth daily.   isosorbide mononitrate 30 MG 24 hr tablet Commonly known as:  IMDUR Take 1 tablet by mouth daily.   levothyroxine 50 MCG tablet Commonly known as:  SYNTHROID, LEVOTHROID Take 1 tablet by mouth daily.   lisinopril-hydrochlorothiazide 20-12.5 MG tablet Commonly known as:  PRINZIDE,ZESTORETIC Take 1 tablet by mouth 2 (two) times daily.   metFORMIN 500 MG tablet Commonly known as:  GLUCOPHAGE Take 500 mg by mouth 2 (two) times daily.   metoprolol succinate 50 MG 24 hr tablet Commonly known as:  TOPROL-XL Take 50 mg by mouth daily. Take with or immediately following a meal.   multivitamin tablet Take 1 tablet by mouth daily.   nitroGLYCERIN 0.4 MG SL tablet Commonly known as:  NITROSTAT Place 0.4 mg under the tongue every 5 (five) minutes as needed for chest pain.   OMEGA 3 PO Take 1,000 mg by mouth daily.   pravastatin 10 MG tablet Commonly known as:  PRAVACHOL Take 1 tablet by mouth daily.   rOPINIRole 0.5 MG tablet Commonly known as:  REQUIP Take 1 tablet by mouth 2 (two) times daily.   Saw Palmetto 450 MG Caps Take 1 capsule by mouth daily.   spironolactone 25 MG tablet Commonly known as:  ALDACTONE Take 1 tablet by mouth daily.   tamsulosin 0.4 MG Caps capsule Commonly known as:  FLOMAX Take 0.4 mg by mouth daily.       Follow-up Information    Sharilyn Sites, MD Follow up in 1 week(s).   Specialty:  Family Medicine Contact information: [address] [place] [address] [phone]        Rosalin Hawking, MD. Schedule an appointment as soon as possible for a visit in 6 week(s).   Specialty:  Neurology Contact information: [address] [place] [address] [phone]          No Known Allergies      Procedures/Studies: Ct Angio Head W Or Wo Contrast  Result Date: 12/03/2016 CLINICAL DATA:  "Cerebrovascular malformation, high flow, evaluation." History per the chart is patient passed out at lunch. EXAM: CT ANGIOGRAPHY HEAD AND NECK TECHNIQUE: Multidetector CT imaging of the head and neck was performed using the standard protocol during bolus administration of intravenous contrast. Multiplanar CT image reconstructions and MIPs were obtained to evaluate the vascular anatomy. Carotid stenosis measurements (when applicable) are obtained utilizing NASCET criteria, using the distal internal carotid diameter as the denominator. CONTRAST:  122mL ISOVUE-370 IOPAMIDOL (ISOVUE-370) INJECTION 76% COMPARISON:  Head CT earlier today.  MRA from yesterday. FINDINGS: CTA NECK FINDINGS Aortic arch: Atherosclerosis.  Three vessel branching. Right carotid system: Diffuse atheromatous wall thickening of the common carotid artery. Primarily noncalcified plaque at the common carotid bifurcation and ICA bulb. Proximal ICA stenosis measures up to 40%. No  ulceration. Left carotid system: Diffuse atheromatous wall thickening of the common carotid artery. Bulky calcified and noncalcified plaque at the ICA bulb and proximal segment. Proximal ICA stenosis measures up to 30%. No ulceration or dissection. ICA tortuosity. Vertebral arteries: Proximal subclavian atherosclerosis without flow limiting stenosis. High-grade narrowing at the right V1 segment. Mild narrowing at the dominant left V1 segment, both  due to atherosclerotic plaque. There is a mild left V2 segment stenosis. Skeleton: C4-5 and C6-7 ACDF. Severe adjacent segment disc disease. No acute or aggressive finding. Other neck: No incidental mass or adenopathy. Upper chest: No acute finding. Review of the MIP images confirms the above findings CTA HEAD FINDINGS Anterior circulation: Atherosclerotic plaque on both carotid siphons with up to mild luminal narrowing. Moderate left M1 segment stenosis, see axial thick MIPS. This could have progressed since previous MRA as the vessel lumen appears slightly more irregular on coronal MIPS. Bilateral MCA branch atherosclerotic irregularity. Advanced narrowing at the left A1 origin. Negative for aneurysm or branch occlusion. Moderate proximal left P2 branch stenosis marked on 7:94 Posterior circulation: Left dominant vertebral artery. Both picas are patent. Moderate narrowing of the mid basilar. Moderate narrowing at the proximal left P2 segment. Severe stenosis of right proximal P2 and distal P2 segments. Venous sinuses: Patent Anatomic variants: As above Delayed phase: No abnormal intracranial enhancement. Review of the MIP images confirms the above findings IMPRESSION: Neck CTA: 1. No acute finding. No vertebral occlusion or insufficiency to explain syncope. 2. Diffuse atherosclerosis with 30-40% proximal ICA narrowing bilaterally. 3. Advanced right and mild-to-moderate left V1 segment stenosis. Intracranial CTA: 1. Moderate left M1 segment stenosis. Atheromatous type irregularity of this vessel is more prominent than on MRA yesterday. This could be due to differences in technique or from fluctuating plaque in this patient with a known acute left basal ganglia infarct. 2. Otherwise stable advanced intracranial atherosclerosis as described above. The most severe narrowing involves the right P2 segment and left A1 segment origin. Moderate narrowing of the mid basilar and left P2 segment. Electronically Signed   By:  Monte Fantasia M.D.   On: 12/03/2016 17:09   Dg Chest 1 View  Result Date: 12/03/2016 CLINICAL DATA:  Syncope. EXAM: CHEST 1 VIEW COMPARISON:  Chest x-ray 11/22/2010 FINDINGS: The cardiac silhouette, mediastinal and hilar contours are within normal limits given the AP projection and portable technique. Low lung volumes with mild vascular crowding and streaky basilar atelectasis but no infiltrates, edema or effusions. The bony thorax is intact. IMPRESSION: No acute cardiopulmonary findings. Electronically Signed   By: Marijo Sanes M.D.   On: 12/03/2016 14:02   Ct Head Wo Contrast  Result Date: 12/03/2016 CLINICAL DATA:  Altered level of consciousness. Passed out during lunch. EXAM: CT HEAD WITHOUT CONTRAST TECHNIQUE: Contiguous axial images were obtained from the base of the skull through the vertex without intravenous contrast. COMPARISON:  Brain MRI from yesterday FINDINGS: Brain: Known acute infarct in the left basal ganglia and corona radiata. Remote lacunar infarct in the left thalamus. Confluent low-density in the cerebral white matter from chronic small vessel ischemia. No hemorrhage or new infarct. No hydrocephalus or masslike finding. Vascular: Atherosclerotic calcification.  No hyperdense vessel. Skull: No acute or aggressive finding. Small right parietal subgaleal lipoma. Sinuses/Orbits: Negative IMPRESSION: 1. No acute finding when compared to yesterday. 2. Known acute perforator infarct in the left basal ganglia. 3. Extensive chronic small vessel ischemic injury. Electronically Signed   By: Monte Fantasia M.D.   On: 12/03/2016 14:05  Ct Head Wo Contrast  Result Date: 12/02/2016 CLINICAL DATA:  Slurred speech and altered mental status. Right-sided weakness. Dizziness. EXAM: CT HEAD WITHOUT CONTRAST TECHNIQUE: Contiguous axial images were obtained from the base of the skull through the vertex without intravenous contrast. COMPARISON:  Head CT and brain MRI September 13, 2015 FINDINGS:  Brain: There is mild diffuse atrophy. There is no intracranial mass, hemorrhage, extra-axial fluid collection, or midline shift. There is patchy small vessel disease throughout the centra semiovale bilaterally. There is a prior small infarct in the anterior left thalamus as well as a nearby small infarct in the posterior left lentiform nucleus. Small vessel disease is noted in the external capsule on the left. There is small vessel disease with prior small infarct in the lateral right upper pons. There is no new gray-white compartment lesion. No acute infarct is demonstrable on this study. Vascular: There is no appreciable hyperdense vessel. There is calcification in each carotid siphon region as well as in the distal left vertebral artery. Skull: Bony calvarium appears intact. Sinuses/Orbits: There is mucosal thickening in multiple ethmoid air cells. There is mucosal thickening in the right maxillary antrum. Other visualized paranasal sinuses are clear. Visualized orbits appear symmetric bilaterally. Other: Mastoid air cells are clear. IMPRESSION: Atrophy with extensive supratentorial small vessel disease. Prior small infarcts noted in the anterior left thalamus, posterior left lentiform nucleus, left external capsule, and upper right pons. No acute infarct demonstrable on this study. No intracranial mass, hemorrhage, or extra-axial fluid collection. There are foci of arterial vascular calcification. Foci of paranasal sinus disease noted. Electronically Signed   By: Lowella Grip III M.D.   On: 12/02/2016 12:12   Ct Angio Neck W Or Wo Contrast  Result Date: 12/03/2016 CLINICAL DATA:  "Cerebrovascular malformation, high flow, evaluation." History per the chart is patient passed out at lunch. EXAM: CT ANGIOGRAPHY HEAD AND NECK TECHNIQUE: Multidetector CT imaging of the head and neck was performed using the standard protocol during bolus administration of intravenous contrast. Multiplanar CT image  reconstructions and MIPs were obtained to evaluate the vascular anatomy. Carotid stenosis measurements (when applicable) are obtained utilizing NASCET criteria, using the distal internal carotid diameter as the denominator. CONTRAST:  145mL ISOVUE-370 IOPAMIDOL (ISOVUE-370) INJECTION 76% COMPARISON:  Head CT earlier today.  MRA from yesterday. FINDINGS: CTA NECK FINDINGS Aortic arch: Atherosclerosis.  Three vessel branching. Right carotid system: Diffuse atheromatous wall thickening of the common carotid artery. Primarily noncalcified plaque at the common carotid bifurcation and ICA bulb. Proximal ICA stenosis measures up to 40%. No ulceration. Left carotid system: Diffuse atheromatous wall thickening of the common carotid artery. Bulky calcified and noncalcified plaque at the ICA bulb and proximal segment. Proximal ICA stenosis measures up to 30%. No ulceration or dissection. ICA tortuosity. Vertebral arteries: Proximal subclavian atherosclerosis without flow limiting stenosis. High-grade narrowing at the right V1 segment. Mild narrowing at the dominant left V1 segment, both due to atherosclerotic plaque. There is a mild left V2 segment stenosis. Skeleton: C4-5 and C6-7 ACDF. Severe adjacent segment disc disease. No acute or aggressive finding. Other neck: No incidental mass or adenopathy. Upper chest: No acute finding. Review of the MIP images confirms the above findings CTA HEAD FINDINGS Anterior circulation: Atherosclerotic plaque on both carotid siphons with up to mild luminal narrowing. Moderate left M1 segment stenosis, see axial thick MIPS. This could have progressed since previous MRA as the vessel lumen appears slightly more irregular on coronal MIPS. Bilateral MCA branch atherosclerotic irregularity. Advanced narrowing at  the left A1 origin. Negative for aneurysm or branch occlusion. Moderate proximal left P2 branch stenosis marked on 7:94 Posterior circulation: Left dominant vertebral artery. Both picas  are patent. Moderate narrowing of the mid basilar. Moderate narrowing at the proximal left P2 segment. Severe stenosis of right proximal P2 and distal P2 segments. Venous sinuses: Patent Anatomic variants: As above Delayed phase: No abnormal intracranial enhancement. Review of the MIP images confirms the above findings IMPRESSION: Neck CTA: 1. No acute finding. No vertebral occlusion or insufficiency to explain syncope. 2. Diffuse atherosclerosis with 30-40% proximal ICA narrowing bilaterally. 3. Advanced right and mild-to-moderate left V1 segment stenosis. Intracranial CTA: 1. Moderate left M1 segment stenosis. Atheromatous type irregularity of this vessel is more prominent than on MRA yesterday. This could be due to differences in technique or from fluctuating plaque in this patient with a known acute left basal ganglia infarct. 2. Otherwise stable advanced intracranial atherosclerosis as described above. The most severe narrowing involves the right P2 segment and left A1 segment origin. Moderate narrowing of the mid basilar and left P2 segment. Electronically Signed   By: Monte Fantasia M.D.   On: 12/03/2016 17:09   Mr Jodene Nam Head Wo Contrast  Result Date: 12/02/2016 CLINICAL DATA:  81 year old male with slurred speech and weakness since 1000 hours yesterday. Dizziness, drooling and choking. EXAM: MRI HEAD WITHOUT CONTRAST MRA HEAD WITHOUT CONTRAST TECHNIQUE: Multiplanar, multiecho pulse sequences of the brain and surrounding structures were obtained without intravenous contrast. Angiographic images of the head were obtained using MRA technique without contrast. COMPARISON:  Head CT without contrast 1158 hours today. Brain MRI and intracranial MRA 09/13/2015. FINDINGS: MRI HEAD FINDINGS Brain: 20 mm curvilinear area of restricted diffusion tracking from the left corona radiata into the posterior left lentiform (Series 6, image 51). Associated mild T2 and FLAIR hyperintensity. There is a separate small area of  restricted diffusion along the floor of the left lentiform on series 6, image 54. No associated hemorrhage or mass effect. No other restricted diffusion. Underlying multifocal chronic lacunar infarcts in the bilateral deep gray matter nuclei, the right lateral pons, the left cerebellum, and bilateral cerebral white matter with chronic confluent bilateral white matter T2 and FLAIR hyperintensity. Outside of the acute findings gray and white matter signal is stable. No chronic cerebral blood products identified. No midline shift, mass effect, evidence of mass lesion, ventriculomegaly, extra-axial collection or acute intracranial hemorrhage. Cervicomedullary junction and pituitary are within normal limits. Vascular: Major intracranial vascular flow voids stable with generalized intracranial artery dolichoectasia. Skull and upper cervical spine: Negative. Visualized bone marrow signal is within normal limits. Sinuses/Orbits: Leftward gaze deviation. Otherwise negative orbits soft tissues. Mild paranasal sinus mucosal thickening is stable. Other: Trace mastoid fluid on the left is increased but appears inconsequential. Visible internal auditory structures appear normal. Scalp and face soft tissues appear negative. MRA HEAD FINDINGS Dominant distal left vertebral artery is stable since 2017 with tortuosity and irregularity but no significant stenosis. Diminutive non dominant distal right vertebral artery is highly irregular and may be occluded proximal to the right PICA origin. The distal right V4 segment to the right PICA may be supplied in a retrograde fashion. Stable basilar artery with irregularity but no significant stenosis. SCA and PCA origins remain patent and within normal limits. Moderate to severe chronic right P2 segment irregularity and stenosis. Tortuous left P1 segment. Stable PCAs. Stable antegrade flow in both ICA siphons. Bilateral siphon irregularity without significant stenosis. Posterior communicating  arteries are diminutive or  absent. Patent carotid termini. Stable MCA and ACA origins. The right A1 is dominant. Moderate chronic stenosis at the left ACA origin is stable. Visible ACA branches are within normal limits. Left MCA M1 segment and left MCA bifurcation remain patent with mild irregularity but no stenosis. Increased moderate stenosis in a posterior left MCA M2 branch on series 109, image 10. Stable right MCA and right MCA bifurcation where there is mild irregularity and stenosis. Stable right MCA branches. IMPRESSION: 1. Small acute lacunar infarcts in the left lenticulostriate artery territory. No associated hemorrhage or mass effect. The dominant infarct extends from the left corona radiata to the posterior lentiform. 2. Underlying severe chronic small vessel ischemia is otherwise stable since 2017. 3. 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 - progressed left MCA M2 branch stenosis, now moderate. - moderate to severe chronic stenosis of the right PCA. - chronic mild to moderate stenosis at the left ACA origin and right MCA bifurcation. Electronically Signed   By: Genevie Ann M.D.   On: 12/02/2016 14:33   Mr Brain Wo Contrast  Result Date: 12/02/2016 CLINICAL DATA:  81 year old male with slurred speech and weakness since 1000 hours yesterday. Dizziness, drooling and choking. EXAM: MRI HEAD WITHOUT CONTRAST MRA HEAD WITHOUT CONTRAST TECHNIQUE: Multiplanar, multiecho pulse sequences of the brain and surrounding structures were obtained without intravenous contrast. Angiographic images of the head were obtained using MRA technique without contrast. COMPARISON:  Head CT without contrast 1158 hours today. Brain MRI and intracranial MRA 09/13/2015. FINDINGS: MRI HEAD FINDINGS Brain: 20 mm curvilinear area of restricted diffusion tracking from the left corona  radiata into the posterior left lentiform (Series 6, image 51). Associated mild T2 and FLAIR hyperintensity. There is a separate small area of restricted diffusion along the floor of the left lentiform on series 6, image 54. No associated hemorrhage or mass effect. No other restricted diffusion. Underlying multifocal chronic lacunar infarcts in the bilateral deep gray matter nuclei, the right lateral pons, the left cerebellum, and bilateral cerebral white matter with chronic confluent bilateral white matter T2 and FLAIR hyperintensity. Outside of the acute findings gray and white matter signal is stable. No chronic cerebral blood products identified. No midline shift, mass effect, evidence of mass lesion, ventriculomegaly, extra-axial collection or acute intracranial hemorrhage. Cervicomedullary junction and pituitary are within normal limits. Vascular: Major intracranial vascular flow voids stable with generalized intracranial artery dolichoectasia. Skull and upper cervical spine: Negative. Visualized bone marrow signal is within normal limits. Sinuses/Orbits: Leftward gaze deviation. Otherwise negative orbits soft tissues. Mild paranasal sinus mucosal thickening is stable. Other: Trace mastoid fluid on the left is increased but appears inconsequential. Visible internal auditory structures appear normal. Scalp and face soft tissues appear negative. MRA HEAD FINDINGS Dominant distal left vertebral artery is stable since 2017 with tortuosity and irregularity but no significant stenosis. Diminutive non dominant distal right vertebral artery is highly irregular and may be occluded proximal to the right PICA origin. The distal right V4 segment to the right PICA may be supplied in a retrograde fashion. Stable basilar artery with irregularity but no significant stenosis. SCA and PCA origins remain patent and within normal limits. Moderate to severe chronic right P2 segment irregularity and stenosis. Tortuous left P1  segment. Stable PCAs. Stable antegrade flow in both ICA siphons. Bilateral siphon irregularity without significant stenosis. Posterior communicating arteries are diminutive or absent. Patent carotid termini.  Stable MCA and ACA origins. The right A1 is dominant. Moderate chronic stenosis at the left ACA origin is stable. Visible ACA branches are within normal limits. Left MCA M1 segment and left MCA bifurcation remain patent with mild irregularity but no stenosis. Increased moderate stenosis in a posterior left MCA M2 branch on series 109, image 10. Stable right MCA and right MCA bifurcation where there is mild irregularity and stenosis. Stable right MCA branches. IMPRESSION: 1. Small acute lacunar infarcts in the left lenticulostriate artery territory. No associated hemorrhage or mass effect. The dominant infarct extends from the left corona radiata to the posterior lentiform. 2. Underlying severe chronic small vessel ischemia is otherwise stable since 2017. 3. 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 - progressed left MCA M2 branch stenosis, now moderate. - moderate to severe chronic stenosis of the right PCA. - chronic mild to moderate stenosis at the left ACA origin and right MCA bifurcation. Electronically Signed   By: Genevie Ann M.D.   On: 12/02/2016 14:33   US Carotid Bilateral (at [facility] Only)  Result Date: 12/02/2016 CLINICAL DATA:  Stroke. EXAM: BILATERAL CAROTID DUPLEX ULTRASOUND TECHNIQUE: Pearline Cables scale imaging, color Doppler and duplex ultrasound were performed of bilateral carotid and vertebral arteries in the neck. COMPARISON:  09/13/2015 FINDINGS: Criteria: Quantification of carotid stenosis is based on velocity parameters that correlate the residual internal carotid diameter with NASCET-based stenosis levels, using the diameter of the distal  internal carotid lumen as the denominator for stenosis measurement. The following velocity measurements were obtained: RIGHT ICA:  169/23 cm/sec CCA:  41/74 cm/sec SYSTOLIC ICA/CCA RATIO:  2.1 DIASTOLIC ICA/CCA RATIO:  1.5 ECA:  121 cm/sec LEFT ICA:  104/38 cm/sec CCA:  08/14 cm/sec SYSTOLIC ICA/CCA RATIO:  1.1 DIASTOLIC ICA/CCA RATIO:  2.4 ECA:  149 cm/sec RIGHT CAROTID ARTERY: Diffuse atheromatous wall thickening of the common carotid. Calcified and noncalcified plaque at the bifurcation. Low resistance waveform. RIGHT VERTEBRAL ARTERY:  Antegrade flow. LEFT CAROTID ARTERY: Diffuse atheromatous wall thickening of the common carotid. Predominately calcified moderate to bulky plaque at the bifurcation. LEFT VERTEBRAL ARTERY:  Antegrade flow. IMPRESSION: 1. Diffuse atherosclerotic plaque in the cervical carotids. 2. Elevated right ICA velocities in the lower end of the 50-69% stenotic range. 3. Less than 50% left ICA stenosis. 4. Antegrade flow in both vertebral arteries. Electronically Signed   By: Monte Fantasia M.D.   On: 12/02/2016 19:08   2D echo  - Left ventricle: Posterior basal hypokinesis The cavity size was   normal. Wall thickness was increased in a pattern of moderate   LVH. Systolic function was normal. The estimated ejection   fraction was in the range of 50% to 55%. Wall motion was normal;   there were no regional wall motion abnormalities. Left   ventricular diastolic function parameters were normal. - Aortic valve: There was trivial regurgitation.   Subjective: Denies any weakness, numbness, dizziness.  Denies difficulty swallowing or speech impairment.  Discharge Exam: Vitals:   12/05/16 0430 12/05/16 0830  BP: (!) 186/75 (!) 182/76  Pulse: 62 64  Resp: 16 18  Temp: (!) 97 F (36.1 C) 98 F (36.7 C)  SpO2: 96% 99%   Vitals:   12/04/16 2030 12/05/16 0030 12/05/16 0430 12/05/16 0830  BP: (!) 175/76 (!) 170/60 (!) 186/75 (!) 182/76  Pulse: 80 77 62 64  Resp: 16 16 16  18  Temp: 98 F (36.7 C) 98.2 F (36.8 C) (!) 97 F (36.1 C) 98 F (36.7 C)  TempSrc:  Oral Oral Oral  SpO2: 98% 97% 96% 99%  Weight:      Height:        General: Elderly male not in distress HEENT: No pallor, no icterus, moist oral mucosa, supple neck, pupils reactive bilaterally, EOMI Chest: Clear to auscultation bilaterally, no added sound CVS: Normal S1-S2, no murmurs rubs or gallop GI: Soft, nondistended, nontender, bowel sounds present Musculoskeletal: Warm, no edema CNS: Alert and oriented, nonfocal    The results of significant diagnostics from this hospitalization (including imaging, microbiology, ancillary and laboratory) are listed below for reference.     Microbiology: No results found for this or any previous visit (from the past 240 hour(s)).   Labs: BNP (last 3 results) No results for input(s): BNP in the last 8760 hours. Basic Metabolic Panel: Recent Labs  Lab 12/02/16 1133 12/02/16 1136 12/03/16 0654 12/03/16 1331 12/04/16 0329 12/05/16 0741  NA 137 141 137 138 139 139  K 3.6 3.7 3.5 4.0 3.2* 3.9  CL 102 104 103 104 112* 110  CO2 27  --  26 24 22  21*  GLUCOSE 105* 102* 132* 168* 131* 126*  BUN 24* 24* 22* 25* 22* 18  CREATININE 1.02 1.00 1.02 1.28* 0.84 0.79  CALCIUM 10.4*  --  9.6 9.7 8.9 9.3   Liver Function Tests: Recent Labs  Lab 12/02/16 1133 12/03/16 1331 12/04/16 0329  AST 28 25 18   ALT 33 31 24  ALKPHOS 43 41 34*  BILITOT 1.0 0.9 0.8  PROT 8.0 7.4 6.4*  ALBUMIN 4.8 4.5 3.8   No results for input(s): LIPASE, AMYLASE in the last 168 hours. No results for input(s): AMMONIA in the last 168 hours. CBC: Recent Labs  Lab 12/02/16 1133 12/02/16 1136 12/03/16 1331 12/04/16 0329  WBC 5.3  --  4.5 4.0  NEUTROABS 2.8  --  3.0  --   HGB 14.0 14.6 13.8 12.3*  HCT 44.0 43.0 42.2 37.4*  MCV 83.3  --  82.9 83.1  PLT 293  --  247 234   Cardiac Enzymes: Recent Labs  Lab 12/03/16 1331 12/03/16 1556 12/03/16 2138 12/04/16 0329   TROPONINI <0.03 <0.03 <0.03 <0.03   BNP: Invalid input(s): POCBNP CBG: Recent Labs  Lab 12/04/16 1106 12/04/16 1617 12/04/16 2231 12/05/16 0740 12/05/16 1112  GLUCAP 157* 102* 140* 108* 170*   D-Dimer No results for input(s): DDIMER in the last 72 hours. Hgb A1c Recent Labs    12/03/16 0654  HGBA1C 6.9*   Lipid Profile Recent Labs    12/03/16 0654  CHOL 130  HDL 24*  LDLCALC 49  TRIG 286*  CHOLHDL 5.4   Thyroid function studies No results for input(s): TSH, T4TOTAL, T3FREE, THYROIDAB in the last 72 hours.  Invalid input(s): FREET3 Anemia work up No results for input(s): VITAMINB12, FOLATE, FERRITIN, TIBC, IRON, RETICCTPCT in the last 72 hours. Urinalysis    Component Value Date/Time   COLORURINE YELLOW 09/13/2015 0906   APPEARANCEUR CLEAR 09/13/2015 0906   LABSPEC 1.010 09/13/2015 0906   PHURINE 6.5 09/13/2015 0906   GLUCOSEU NEGATIVE 09/13/2015 0906   HGBUR NEGATIVE 09/13/2015 0906   BILIRUBINUR NEGATIVE 09/13/2015 0906   KETONESUR NEGATIVE 09/13/2015 0906   PROTEINUR NEGATIVE 09/13/2015 0906   UROBILINOGEN 0.2 04/15/2010 1425   NITRITE NEGATIVE 09/13/2015 0906   LEUKOCYTESUR NEGATIVE 09/13/2015 0906   Sepsis Labs Invalid input(s): PROCALCITONIN,  WBC,  [place] Microbiology No results found for this or any previous visit (from the past 240 hour(s)).   Time coordinating discharge: >30 minutes  SIGNED:   Louellen Molder, MD  [facility] 12/05/2016, 11:56 AM Pager   If 7PM-7AM, please contact night-coverage [email or web address] Password [email or web address]

## 2016-12-05 NOTE — Evaluation (Addendum)
Speech Language Pathology Evaluation Patient Details Name: Steve Ramirez MRN: [id number] DOB: Sep 26, 1935 Today's Date: 12/05/2016 Time: 0800-0829 SLP Time Calculation (min) (ACUTE ONLY): 29 min  Problem List:  Patient Active Problem List   Diagnosis Date Noted  . Near syncope 12/03/2016  . CVA (cerebral vascular accident) ([place]) 12/03/2016  . Right hemiparesis ([place]) 12/02/2016  . Acute ischemic stroke ([place]) 12/02/2016  . Essential hypertension 12/02/2016  . Dysphagia 12/02/2016  . Acute CVA (cerebrovascular accident) ([place]) 09/13/2015  . TIA (transient ischemic attack) 11/29/2014  . Shoulder stiffness 09/05/2012  . Right rotator cuff tear 08/16/2012  . Shoulder subluxation, right 08/09/2012  . Elevated transaminase level 11/05/2011  . Coronary atherosclerosis of native coronary artery 11/26/2010  . Essential hypertension, benign 11/23/2010  . Type 2 diabetes mellitus ([place]) 11/23/2010  . Hyperlipidemia 11/23/2010  . History of TIAs 11/23/2010   Past Medical History:  Past Medical History:  Diagnosis Date  . Coronary atherosclerosis of native coronary artery   . Essential hypertension, benign   . GIB (gastrointestinal bleeding)    Colonic diverticular bleed  . History of melanoma   . History of TIAs   . Stroke ([place])   . Type 2 diabetes mellitus ([place])    Past Surgical History:  Past Surgical History:  Procedure Laterality Date  . COLONOSCOPY  08/13/2010   Procedure: COLONOSCOPY;  Surgeon: Rogene Houston, MD;  Location: [facility];  Service: Endoscopy;  Laterality: N/A;  . KNEE SURGERY    . LEFT HEART CATHETERIZATION WITH CORONARY ANGIOGRAM N/A 11/24/2010   Procedure: LEFT HEART CATHETERIZATION WITH CORONARY ANGIOGRAM;  Surgeon: Josue Hector, MD;  Location: [facility] CATH LAB;  Service: Cardiovascular;  Laterality: N/A;   HPI:  81 y.o.malewith medical history ofright pontine stroke with residual right lower extremity weakness, hypertension, coronary artery disease,  diabetes mellitus,and diverticular GI bleed presented with slurred speech,dysphagiaand drooling that began around 8 AM on December 01, 2016. he is found to have acute stroke. Apparently, hedid not want any further workup during hospitalizationincluding echo and CTA and was dischargedon 11/22.After patient was discharged around 11:30 when the patient started on the dining table to eat, family noticed the patient slumped over to the right, started drooling with food in his mouth. He was semi-conscious for about 10-15 minutes, would intermittently chew his food but would not wake up. Son called EMS. Son is not sure that the patient had low blood pressure during this episode. Case was discussed with neurology.Dr Shea Evans theyrecommend completing workup. CT brain--neg for acute findings. RecentMRI brain--small acute lacunar infarct lenticulostriate artery territory.MRA brain--progressed left MCA M2 branch stenosis, now moderate. moderate to severe chronic stenosis of the right PCA. chronic mild to moderate stenosis at the left ACA origin and rightMCA bifurcation. Carotid Duplex--50%-69% on right. CTA neck:No acute finding. No vertebral occlusion or insufficiency to explain syncope.Diffuse atherosclerosis with 30-40% proximal ICA narrowing bilaterally. Advanced right and mild-to-moderate left V1 segment stenosis. PendEcho. Neurology recommended antiplatelet--ASA + Plavix. Cont statin. SLE/BSE requested.   Assessment / Plan / Recommendation Clinical Impression  Pt discharged on Thursday and re-admitted later the same day after syncopal episode at dinner table. SLE/BSE ordered and SLP evaluated Pt during breakfast meal. Pt continues to present with mild right facial asymmetry/weakness which negatively impacts oral phase of swallow and speech intelligibility. Pt demonstrates good insight into deficits and clears right pocketing as needed and takes small cup sips of coffee (due to mild right labial  spillage). Pt with mild reduced speech intelligibility and  decreases rate as appropriate for production of multisyllabic words. Pt would benefit from outpatient SLP therapy to address mild deficits, however Pt indicates that he will likely not seek outpatient follow up. SLP will sign off at this time.     SLP Assessment  SLP Recommendation/Assessment: All further Speech Lanaguage Pathology  needs can be addressed in the next venue of care SLP Visit Diagnosis: Dysarthria and anarthria (R47.1);Dysphagia, oral phase (R13.11)    Follow Up Recommendations  Outpatient SLP    Frequency and Duration           SLP Evaluation Cognition  Overall Cognitive Status: Within Functional Limits for tasks assessed Arousal/Alertness: Awake/alert Orientation Level: Oriented X4 Memory: Appears intact Awareness: Appears intact Problem Solving: Appears intact Safety/Judgment: Appears intact       Comprehension  Auditory Comprehension Overall Auditory Comprehension: Appears within functional limits for tasks assessed Yes/No Questions: Within Functional Limits Commands: Within Functional Limits Conversation: Complex Visual Recognition/Discrimination Discrimination: Within Function Limits Reading Comprehension Reading Status: Within funtional limits    Expression Expression Primary Mode of Expression: Verbal Verbal Expression Overall Verbal Expression: Impaired Initiation: No impairment Automatic Speech: Name;Social Response;Counting Level of Generative/Spontaneous Verbalization: Conversation Repetition: Impaired Level of Impairment: Sentence level Naming: No impairment Pragmatics: No impairment Interfering Components: Speech intelligibility Non-Verbal Means of Communication: Not applicable Written Expression Dominant Hand: Right Written Expression: Not tested   Oral / Motor  Oral Motor/Sensory Function Overall Oral Motor/Sensory Function: Mild impairment Facial ROM: Reduced  right;Suspected CN VII (facial) dysfunction Facial Symmetry: Abnormal symmetry right Facial Strength: Reduced right Facial Sensation: Reduced right Lingual ROM: Reduced right Lingual Symmetry: Within Functional Limits Lingual Strength: Reduced Lingual Sensation: Within Functional Limits Velum: Impaired right(mildly reduced) Mandible: Within Functional Limits Motor Speech Overall Motor Speech: Impaired Respiration: Within functional limits Phonation: Normal Resonance: Within functional limits Articulation: Impaired Level of Impairment: Sentence Intelligibility: Intelligibility reduced Word: 75-100% accurate Phrase: 75-100% accurate Sentence: 75-100% accurate Conversation: 75-100% accurate Motor Planning: Witnin functional limits Motor Speech Errors: Aware Effective Techniques: Slow rate    G-Codes Verbal Expression Clinical Judgement CI, CI, CI  Thank you,  Genene Churn, [place]                     [place] 12/05/2016, 8:59 AM

## 2016-12-05 NOTE — Progress Notes (Signed)
Patient is to be discharged home and in stable condition. IV and telemetry removed, WNL. Patient given discharged instructions and verbalized understanding. Patient will be escorted out by staff via wheelchair.  Celestia Khat, RN

## 2016-12-05 NOTE — Progress Notes (Signed)
Physical Therapy Treatment Patient Details Name: Steve Ramirez MRN: [id number] DOB: February 09, 1935 Today's Date: 12/05/2016    History of Present Illness 81 y.o.malewith medical history ofright pontine stroke with residual right lower extremity weakness, hypertension, coronary artery disease, diabetes mellitus,and diverticular GI bleed presented with slurred speech,dysphagiaand drooling that began around 8 AM on December 01, 2016. The patient denied any new right upper extremity or right lower extremity weakness, but stated that while he was walking on December 02, 2016, he kept drifting to his right. In addition, the patient states that his dysarthria worsened on the morning of December 02, 2016.The patient also has some drooling on the right side of his mouth with worsening dysphagia type symptoms. As a result, the patient presented to emergency department for further evaluation. The patient's deficits remained stable/improved after overnight observation. The patient did not want any further workup during hospitalization including echo and CTA and was discharged earlier today . After patient was discharged around 11:30 when the patient started on the dining table to eat, family noticed the patient slumped over to the right, started drooling with food in his mouth. He was semi-conscious for about 10-15 minutes, would intermittently chew his food but would not wake up. Son called EMS. Son is not sure that the patient had low blood pressure during this episode    PT Comments    Pt received EOB from nursing and was agreeable to PT treatment. Pt able to ambulate 563ft total this date with no AD and supervision. Performed stair training this date since pt has 5 STE; he reported feeling confident he could perform this at home as he has already installed grab bars since his last stroke. He was able to ambulate 12 stairs this date with supervision and he ascended reciprocally with 1 handrail and  descended with step-to pattern and 1 handrail. Pt appears and verbalizes to be at his baseline level of function.     Follow Up Recommendations  No PT follow up;Supervision for mobility/OOB     Equipment Recommendations  None recommended by PT    Recommendations for Other Services       Precautions / Restrictions Precautions Precautions: Fall Restrictions Weight Bearing Restrictions: No    Mobility  Bed Mobility                  Transfers Overall transfer level: Modified independent Equipment used: None             General transfer comment: sit <> stand EOB  Ambulation/Gait Ambulation/Gait assistance: Supervision Ambulation Distance (Feet): 500 Feet Assistive device: None Gait Pattern/deviations: WFL(Within Functional Limits)   Gait velocity interpretation: Below normal speed for age/gender General Gait Details: grossly WFL, very intermittent staggering on LLE, but recovers intermittently; slow turns/increased steps to [facility] 180deg turn   Stairs Stairs: Yes   Stair Management: One rail Left;Alternating pattern;Step to pattern Number of Stairs: 12 General stair comments: reciprocal with ascent and L HR, step-to with descent and R HR - overall steady and controlled  Wheelchair Mobility    Modified Rankin (Stroke Patients Only)       Balance Overall balance assessment: No apparent balance deficits (not formally assessed)                                          Cognition Arousal/Alertness: Awake/alert Behavior During Therapy: WFL for tasks assessed/performed Overall Cognitive Status:  Within Functional Limits for tasks assessed                                        Exercises      General Comments        Pertinent Vitals/Pain Pain Assessment: No/denies pain    Home Living     Available Help at Discharge: Family;Available 24 hours/day Type of Home: House              Prior Function             PT Goals (current goals can now be found in the care plan section) Acute Rehab PT Goals Patient Stated Goal: return home PT Goal Formulation: With patient Time For Goal Achievement: 12/07/16 Potential to Achieve Goals: Good    Frequency    7X/week      PT Plan      Co-evaluation              AM-PAC PT "6 Clicks" Daily Activity  Outcome Measure  Difficulty turning over in bed (including adjusting bedclothes, sheets and blankets)?: None Difficulty moving from lying on back to sitting on the side of the bed? : None Difficulty sitting down on and standing up from a chair with arms (e.g., wheelchair, bedside commode, etc,.)?: None Help needed moving to and from a bed to chair (including a wheelchair)?: None Help needed walking in hospital room?: None Help needed climbing 3-5 steps with a railing? : A Little 6 Click Score: 23    End of Session Equipment Utilized During Treatment: Gait belt Activity Tolerance: Patient tolerated treatment well Patient left: in bed;with call bell/phone within reach;with family/visitor present Nurse Communication: Mobility status PT Visit Diagnosis: Unsteadiness on feet (R26.81);Other abnormalities of gait and mobility (R26.89);Muscle weakness (generalized) (M62.81)     Time: 1130-1141 PT Time Calculation (min) (ACUTE ONLY): 11 min  Charges:  $Gait Training: 8-22 mins                    G Codes:         Geraldine Solar PT, DPT

## 2016-12-09 DIAGNOSIS — E876 Hypokalemia: Secondary | ICD-10-CM | POA: Diagnosis not present

## 2016-12-09 DIAGNOSIS — Z6830 Body mass index (BMI) 30.0-30.9, adult: Secondary | ICD-10-CM | POA: Diagnosis not present

## 2016-12-09 DIAGNOSIS — E6609 Other obesity due to excess calories: Secondary | ICD-10-CM | POA: Diagnosis not present

## 2016-12-09 DIAGNOSIS — I6521 Occlusion and stenosis of right carotid artery: Secondary | ICD-10-CM | POA: Diagnosis not present

## 2016-12-09 DIAGNOSIS — Z8673 Personal history of transient ischemic attack (TIA), and cerebral infarction without residual deficits: Secondary | ICD-10-CM | POA: Diagnosis not present

## 2016-12-09 DIAGNOSIS — Z1389 Encounter for screening for other disorder: Secondary | ICD-10-CM | POA: Diagnosis not present

## 2016-12-09 DIAGNOSIS — I651 Occlusion and stenosis of basilar artery: Secondary | ICD-10-CM | POA: Diagnosis not present

## 2016-12-09 DIAGNOSIS — I10 Essential (primary) hypertension: Secondary | ICD-10-CM | POA: Diagnosis not present

## 2016-12-09 DIAGNOSIS — E782 Mixed hyperlipidemia: Secondary | ICD-10-CM | POA: Diagnosis not present

## 2016-12-11 ENCOUNTER — Telehealth (HOSPITAL_COMMUNITY): Payer: Self-pay | Admitting: Speech Pathology

## 2016-12-11 NOTE — Telephone Encounter (Signed)
Pt home from hospital today and called wanting to schedule speech. Steve Ramirez had left a note with pt stating she wanted to see him. There is not a speech referral in epic at this time. NF 12/11/16

## 2016-12-15 ENCOUNTER — Ambulatory Visit: Payer: Medicare HMO | Admitting: Diagnostic Neuroimaging

## 2016-12-15 ENCOUNTER — Encounter: Payer: Self-pay | Admitting: Diagnostic Neuroimaging

## 2016-12-15 VITALS — BP 142/68 | HR 67 | Ht 68.0 in | Wt 185.0 lb

## 2016-12-15 DIAGNOSIS — I6381 Other cerebral infarction due to occlusion or stenosis of small artery: Secondary | ICD-10-CM | POA: Diagnosis not present

## 2016-12-15 DIAGNOSIS — E785 Hyperlipidemia, unspecified: Secondary | ICD-10-CM

## 2016-12-15 DIAGNOSIS — E118 Type 2 diabetes mellitus with unspecified complications: Secondary | ICD-10-CM | POA: Diagnosis not present

## 2016-12-15 DIAGNOSIS — I10 Essential (primary) hypertension: Secondary | ICD-10-CM | POA: Diagnosis not present

## 2016-12-15 DIAGNOSIS — E119 Type 2 diabetes mellitus without complications: Secondary | ICD-10-CM | POA: Diagnosis not present

## 2016-12-15 DIAGNOSIS — E782 Mixed hyperlipidemia: Secondary | ICD-10-CM | POA: Diagnosis not present

## 2016-12-15 DIAGNOSIS — I672 Cerebral atherosclerosis: Secondary | ICD-10-CM | POA: Diagnosis not present

## 2016-12-15 NOTE — Patient Instructions (Signed)
-   continue aspirin 81mg  + plavix 75mg  daily (continue dual anti-platelet long term due to recurrent strokes on single anti-platelet therapy)  - continue BP control; ok to keep goal SBP 120-140 due to severe intracranial atherosclerosis and near syncope at last hospital admission  - stroke warning signs and education reviewed

## 2016-12-15 NOTE — Progress Notes (Signed)
[facility]  PATIENT: Steve Ramirez DOB: Oct 24, 1935  REFERRING CLINICIAN: D Tat, MD HISTORY FROM: patient and wife and chart review REASON FOR VISIT: new consult    HISTORICAL  CHIEF COMPLAINT:  Chief Complaint  Patient presents with  . Stroke    rm 7, New Pt, wife- Vaughan Basta, "lots of [place]ta [place], stroke Sept 2017; in 2018 I think I had 2 more strokes, most recent Thanksgiving day"  . Follow-up    hospital FU    HISTORY OF PRESENT ILLNESS:   81 year old male with hypertension, diabetes, hyperglycemia, heart disease, depression, here for evaluation of recurrent strokes.  Patient admitted to hospital in September 2017 for left leg weakness, diagnosed with right pontine ischemic infarction.  Patient due to gait difficulty and veering to the right side.  He noted some slurred speech and trouble swallowing on December 01, 2016.  He was evaluated and recommended to be admitted and possibly transferred to [facility].  However hospital notes indicate the patient wanted to leave [place].  He declined transfer to [facility] but decided to stay at [facility].  This is based on hospital note reviewed.  However patient tells me today that he did not refused to stay in the hospital.  He was discharged on 12/03/16.  At home later that day patient slumped, had weakness, and was taken to the hospital again by ambulance.  Patient was admitted to hospital.  He was found to have acute left posterior limb of internal capsule lacunar ischemic infarction.  Stroke workup was completed.  Patient was also found to have significant intracranial atherosclerosis.  Medical management was advised.  Syncope was felt to be due to hypotension and intracranial stenoses.  Since that time patient is improved.  His right-sided weakness has resolved but he still has some difficulty with handwriting in his right hand.  He has more mood and emotional lability since the stroke.  He  is tolerating his medications.  He is currently on aspirin and Plavix, blood pressure control, cholesterol medication.  Previously he was only on Plavix.     REVIEW OF SYSTEMS: Full 14 system review of systems performed and negative with exception of: Slurred speech restless legs depression cramps runny nose incontinence wheezing.  ALLERGIES: No Known Allergies  HOME MEDICATIONS: Outpatient Medications Prior to Visit  Medication Sig Dispense Refill  . aspirin EC 81 MG EC tablet Take 1 tablet (81 mg total) by mouth daily. 30 tablet   . cetirizine (ZYRTEC) 10 MG tablet Take 10 mg by mouth daily.    . clopidogrel (PLAVIX) 75 MG tablet Take 1 tablet (75 mg total) by mouth daily. 30 tablet 0  . escitalopram (LEXAPRO) 10 MG tablet 10 mg daily.    . fenofibrate (TRICOR) 145 MG tablet Take 145 mg by mouth daily.    . isosorbide mononitrate (IMDUR) 30 MG 24 hr tablet Take 1 tablet by mouth daily.    Marland Kitchen levothyroxine (SYNTHROID, LEVOTHROID) 50 MCG tablet Take 1 tablet by mouth daily.    Marland Kitchen lisinopril-hydrochlorothiazide (PRINZIDE,ZESTORETIC) 20-12.5 MG per tablet Take 1 tablet by mouth 2 (two) times daily.    . metFORMIN (GLUCOPHAGE) 500 MG tablet Take 500 mg by mouth 2 (two) times daily.    . metoprolol succinate (TOPROL-XL) 50 MG 24 hr tablet Take 50 mg by mouth daily. Take with or immediately following a meal.    . Multiple Vitamin (MULTIVITAMIN) tablet Take 1 tablet by mouth daily.      Marland Kitchen  nitroGLYCERIN (NITROSTAT) 0.4 MG SL tablet Place 0.4 mg under the tongue every 5 (five) minutes as needed for chest pain.    . Omega-3 Fatty Acids (OMEGA 3 PO) Take 1,000 mg by mouth daily.     . pravastatin (PRAVACHOL) 10 MG tablet Take 1 tablet by mouth daily.    Marland Kitchen rOPINIRole (REQUIP) 0.5 MG tablet Take 1 tablet by mouth 2 (two) times daily.    . Saw Palmetto 450 MG CAPS Take 1 capsule by mouth daily.    Marland Kitchen spironolactone (ALDACTONE) 25 MG tablet Take 1 tablet by mouth daily.    . tamsulosin (FLOMAX) 0.4 MG  CAPS capsule Take 0.4 mg by mouth daily.     No facility-administered medications prior to visit.     PAST MEDICAL HISTORY: Past Medical History:  Diagnosis Date  . Coronary atherosclerosis of native coronary artery   . Essential hypertension, benign   . GIB (gastrointestinal bleeding)    Colonic diverticular bleed  . History of melanoma   . History of TIAs   . Stroke ([place])   . Type 2 diabetes mellitus ([place])     PAST SURGICAL HISTORY: Past Surgical History:  Procedure Laterality Date  . COLONOSCOPY  08/13/2010   Procedure: COLONOSCOPY;  Surgeon: Rogene Houston, MD;  Location: [facility];  Service: Endoscopy;  Laterality: N/A;  . KNEE SURGERY     arthroscopic  . LEFT HEART CATHETERIZATION WITH CORONARY ANGIOGRAM N/A 11/24/2010   Procedure: LEFT HEART CATHETERIZATION WITH CORONARY ANGIOGRAM;  Surgeon: Josue Hector, MD;  Location: [facility] CATH LAB;  Service: Cardiovascular;  Laterality: N/A;  . neck fusion     years ago    FAMILY HISTORY: Family History  Problem Relation Age of Onset  . Coronary artery disease Father   . Coronary artery disease Brother   . Asthma Unknown   . Emphysema Mother   . Cancer Brother     SOCIAL HISTORY:  Social History   Socioeconomic History  . Marital status: Married    Spouse name: Not on file  . Number of children: Not on file  . Years of education: Not on file  . Highest education level: Not on file  Social Needs  . Financial resource strain: Not on file  . Food insecurity - worry: Not on file  . Food insecurity - inability: Not on file  . Transportation needs - medical: Not on file  . Transportation needs - non-medical: Not on file  Occupational History  . Not on file  Tobacco Use  . Smoking status: Former Smoker    Types: Cigarettes    Last attempt to quit: 01/13/1980    Years since quitting: 36.9  . Smokeless tobacco: Never Used  Substance and Sexual Activity  . Alcohol use: No  . Drug use: No  . Sexual activity: Yes    Other Topics Concern  . Not on file  Social History Narrative   Lives with wife   [employer] education, administration   3 children   Caffeine- 1 pot coffee daily     PHYSICAL EXAM  GENERAL EXAM/CONSTITUTIONAL: Vitals:  Vitals:   12/15/16 1453  BP: (!) 142/68  Pulse: 67  Weight: 185 lb (83.9 kg)  Height: 5\' 8"  (1.727 m)     Body mass index is 28.13 kg/m.  Visual Acuity Screening   Right eye Left eye Both eyes  Without correction:     With correction: 20/30 20/30      Patient is in  no distress; well developed, nourished and groomed; neck is supple  CARDIOVASCULAR:  Examination of carotid arteries is normal; no carotid bruits  Regular rate and rhythm, no murmurs  Examination of peripheral vascular system by observation and palpation is normal  EYES:  Ophthalmoscopic exam of optic discs and posterior segments is normal; no papilledema or hemorrhages  MUSCULOSKELETAL:  Gait, strength, tone, movements noted in Neurologic exam below  NEUROLOGIC: MENTAL STATUS:  No flowsheet data found.  awake, alert, oriented to person, place and time  recent and remote memory intact  normal attention and concentration  language fluent, comprehension intact, naming intact,   fund of knowledge appropriate  CRANIAL NERVE:   2nd - no papilledema on fundoscopic exam  2nd, 3rd, 4th, 6th - pupils equal and reactive to light, visual fields full to confrontation, extraocular muscles intact, no nystagmus  5th - facial sensation symmetric  7th - facial strength symmetric  8th - hearing intact  9th - palate elevates symmetrically, uvula midline  11th - shoulder shrug symmetric  12th - tongue protrusion midline  MILD SLURRED SPEECH  MOTOR:   normal bulk and tone, full strength in the BUE, BLE  SENSORY:   normal and symmetric to light touch, temperature, vibration  COORDINATION:   finger-nose-finger, fine finger movements normal  REFLEXES:   deep  tendon reflexes TRACE and symmetric  GAIT/STATION:   WIDE BASED SLOW GAIT; CAUTIOUS    DIAGNOSTIC DATA (LABS, IMAGING, TESTING) - I reviewed patient records, labs, notes, testing and imaging myself where available.  Lab Results  Component Value Date   WBC 4.0 12/04/2016   HGB 12.3 (L) 12/04/2016   HCT 37.4 (L) 12/04/2016   MCV 83.1 12/04/2016   PLT 234 12/04/2016      Component Value Date/Time   NA 139 12/05/2016 0741   K 3.9 12/05/2016 0741   CL 110 12/05/2016 0741   CO2 21 (L) 12/05/2016 0741   GLUCOSE 126 (H) 12/05/2016 0741   BUN 18 12/05/2016 0741   CREATININE 0.79 12/05/2016 0741   CREATININE 0.86 07/30/2011 1100   CALCIUM 9.3 12/05/2016 0741   PROT 6.4 (L) 12/04/2016 0329   ALBUMIN 3.8 12/04/2016 0329   AST 18 12/04/2016 0329   ALT 24 12/04/2016 0329   ALKPHOS 34 (L) 12/04/2016 0329   BILITOT 0.8 12/04/2016 0329   GFRNONAA >60 12/05/2016 0741   GFRAA >60 12/05/2016 0741   Lab Results  Component Value Date   CHOL 130 12/03/2016   HDL 24 (L) 12/03/2016   LDLCALC 49 12/03/2016   TRIG 286 (H) 12/03/2016   CHOLHDL 5.4 12/03/2016   Lab Results  Component Value Date   HGBA1C 6.9 (H) 12/03/2016   Lab Results  Component Value Date   VITAMINB12 271 04/16/2010   Lab Results  Component Value Date   TSH 1.175 04/16/2010   09/13/15 MRI brain [I reviewed images myself and agree with interpretation. -VRP]  - Correction to the impression of the report. The first line should read "Acute infarct right lateral pons" , not thalamus - Acute infarct right lateral  - Small acute infarct left medial frontal white matter - Extensive chronic microvascular ischemic changes stable since 2016.  09/25/15 cardiac monitor - Cardiac monitor demonstrated NSR without arrhythmias or pauses.  12/02/16 MRI / MRA brain [I reviewed images myself and agree with interpretation. -VRP]  1. Small acute lacunar infarcts in the left lenticulostriate artery territory. No associated  hemorrhage or mass effect. The dominant infarct extends from the left  corona radiata to the posterior lentiform. 2. Underlying severe chronic small vessel ischemia is otherwise stable since 2017. 3. 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 - progressed left MCA M2 branch stenosis, now moderate. - moderate to severe chronic stenosis of the right PCA. - chronic mild to moderate stenosis at the left ACA origin and right MCA bifurcation.  12/03/16 CTA head  1. Moderate left M1 segment stenosis. Atheromatous type irregularity of this vessel is more prominent than on MRA yesterday. This could be due to differences in technique or from fluctuating plaque in this patient with a known acute left basal ganglia infarct. 2. Otherwise stable advanced intracranial atherosclerosis as described above. The most severe narrowing involves the right P2 segment and left A1 segment origin. Moderate narrowing of the mid basilar and left P2 segment.  12/02/16 carotid u/s  1. Diffuse atherosclerotic plaque in the cervical carotids. 2. Elevated right ICA velocities in the lower end of the 50-69% stenotic range. 3. Less than 50% left ICA stenosis. 4. Antegrade flow in both vertebral arteries.  12/03/16 CTA neck 1. No acute finding. No vertebral occlusion or insufficiency to explain syncope. 2. Diffuse atherosclerosis with 30-40% proximal ICA narrowing bilaterally. 3. Advanced right and mild-to-moderate left V1 segment stenosis.  12/04/16 TTE - Left ventricle: Posterior basal hypokinesis The cavity size was   normal. Wall thickness was increased in a pattern of moderate   LVH. Systolic function was normal. The estimated ejection   fraction was in the range of 50% to 55%. Wall motion was normal;   there were no regional wall motion abnormalities. Left    ventricular diastolic function parameters were normal. - Aortic valve: There was trivial regurgitation.    ASSESSMENT AND PLAN  81 y.o. year old male here with:  Dx:   1. Advanced small vessel ischemic disease --> left internal capsule and right pontine chronic lacunar ischemic infarcts 2. Advanced large vessel atherosclerosis  1. Lacunar stroke   2. Intracranial atherosclerosis   3. Essential hypertension   4. Type 2 diabetes mellitus with complication, without long-term current use of insulin ([place])   5. Hyperlipidemia, unspecified hyperlipidemia type      PLAN: - continue aspirin 81mg  + plavix 75mg  daily (continue dual anti-platelet long term due to recurrent strokes on single anti-platelet therapy) - continue BP control; ok to keep goal SBP 120-140 due to severe intracranial atherosclerosis and near syncope at last hospital admission - stroke warning signs and education reviewed  Return if symptoms worsen or fail to improve, for return to PCP.  I reviewed images, labs, notes, records myself. I summarized findings and reviewed with patient, for this high risk condition (recurrent stroke) requiring high complexity decision making.     Penni Bombard, MD 87/05/6431, 2:95 PM Certified in Neurology, Neurophysiology and Neuroimaging  [facility] [address], [place], [place] [address] [phone]

## 2017-01-15 DIAGNOSIS — G8191 Hemiplegia, unspecified affecting right dominant side: Secondary | ICD-10-CM | POA: Diagnosis not present

## 2017-01-15 DIAGNOSIS — E119 Type 2 diabetes mellitus without complications: Secondary | ICD-10-CM | POA: Diagnosis not present

## 2017-01-15 DIAGNOSIS — E782 Mixed hyperlipidemia: Secondary | ICD-10-CM | POA: Diagnosis not present

## 2017-01-15 DIAGNOSIS — I2510 Atherosclerotic heart disease of native coronary artery without angina pectoris: Secondary | ICD-10-CM | POA: Diagnosis not present

## 2017-01-15 DIAGNOSIS — J329 Chronic sinusitis, unspecified: Secondary | ICD-10-CM | POA: Diagnosis not present

## 2017-01-15 DIAGNOSIS — I10 Essential (primary) hypertension: Secondary | ICD-10-CM | POA: Diagnosis not present

## 2017-01-15 DIAGNOSIS — R1310 Dysphagia, unspecified: Secondary | ICD-10-CM | POA: Diagnosis not present

## 2017-01-15 DIAGNOSIS — Z6829 Body mass index (BMI) 29.0-29.9, adult: Secondary | ICD-10-CM | POA: Diagnosis not present

## 2017-01-15 DIAGNOSIS — G2581 Restless legs syndrome: Secondary | ICD-10-CM | POA: Diagnosis not present

## 2017-01-15 DIAGNOSIS — E663 Overweight: Secondary | ICD-10-CM | POA: Diagnosis not present

## 2017-02-17 ENCOUNTER — Ambulatory Visit (INDEPENDENT_AMBULATORY_CARE_PROVIDER_SITE_OTHER): Payer: Medicare HMO

## 2017-02-17 ENCOUNTER — Ambulatory Visit: Payer: Medicare HMO | Admitting: Orthopedic Surgery

## 2017-02-17 ENCOUNTER — Encounter: Payer: Self-pay | Admitting: Orthopedic Surgery

## 2017-02-17 ENCOUNTER — Telehealth: Payer: Self-pay | Admitting: Radiology

## 2017-02-17 VITALS — BP 173/73 | HR 54 | Ht 68.0 in | Wt 185.0 lb

## 2017-02-17 DIAGNOSIS — M25551 Pain in right hip: Secondary | ICD-10-CM

## 2017-02-17 DIAGNOSIS — M1611 Unilateral primary osteoarthritis, right hip: Secondary | ICD-10-CM

## 2017-02-17 NOTE — Telephone Encounter (Signed)
Call about right hip injection

## 2017-02-17 NOTE — Telephone Encounter (Signed)
He has to d/c Plavix for the injection, Dr Hilma Favors has him on the Plavix

## 2017-02-17 NOTE — Progress Notes (Signed)
Progress Note   Patient ID: Steve Ramirez, male   DOB: 1935-11-12, 82 y.o.   MRN: [id number]  Chief Complaint  Patient presents with  . Hip Pain    right lateral hip pain x 2weeks no injury     He 82-year-old and spinal 2-week history of new onset atraumatic right thigh pain which starts on the lateral side of his right hip radiates down the front of his thigh towards his knee associated with difficulty bending forward and tying his shoes.  Pain is dull constant moderate in severity and is not associated with any trauma     Review of Systems  Constitutional: Negative for chills and fever.  Musculoskeletal: Negative for back pain.  Neurological: Negative for tingling.   Current Meds  Medication Sig  . aspirin EC 81 MG EC tablet Take 1 tablet (81 mg total) by mouth daily.  . cetirizine (ZYRTEC) 10 MG tablet Take 10 mg by mouth daily.  . clopidogrel (PLAVIX) 75 MG tablet Take 1 tablet (75 mg total) by mouth daily.  Marland Kitchen escitalopram (LEXAPRO) 10 MG tablet 10 mg daily.  . fenofibrate (TRICOR) 145 MG tablet Take 145 mg by mouth daily.  . isosorbide mononitrate (IMDUR) 30 MG 24 hr tablet Take 1 tablet by mouth daily.  Marland Kitchen levothyroxine (SYNTHROID, LEVOTHROID) 50 MCG tablet Take 1 tablet by mouth daily.  Marland Kitchen lisinopril-hydrochlorothiazide (PRINZIDE,ZESTORETIC) 20-12.5 MG per tablet Take 1 tablet by mouth 2 (two) times daily.  . metFORMIN (GLUCOPHAGE) 500 MG tablet Take 500 mg by mouth 2 (two) times daily.  . metoprolol succinate (TOPROL-XL) 50 MG 24 hr tablet Take 50 mg by mouth daily. Take with or immediately following a meal.  . Multiple Vitamin (MULTIVITAMIN) tablet Take 1 tablet by mouth daily.    . nitroGLYCERIN (NITROSTAT) 0.4 MG SL tablet Place 0.4 mg under the tongue every 5 (five) minutes as needed for chest pain.  . Omega-3 Fatty Acids (OMEGA 3 PO) Take 1,000 mg by mouth daily.   . pravastatin (PRAVACHOL) 10 MG tablet Take 1 tablet by mouth daily.  Marland Kitchen rOPINIRole (REQUIP) 0.5 MG  tablet Take 1 tablet by mouth 2 (two) times daily.  . Saw Palmetto 450 MG CAPS Take 1 capsule by mouth daily.  Marland Kitchen spironolactone (ALDACTONE) 25 MG tablet Take 1 tablet by mouth daily.  . tamsulosin (FLOMAX) 0.4 MG CAPS capsule Take 0.4 mg by mouth daily.    Past Medical History:  Diagnosis Date  . Coronary atherosclerosis of native coronary artery   . Essential hypertension, benign   . GIB (gastrointestinal bleeding)    Colonic diverticular bleed  . History of melanoma   . History of TIAs   . Stroke ([place])   . Type 2 diabetes mellitus ([facility])      No Known Allergies  BP (!) 173/73   Pulse (!) 54   Ht 5\' 8"  (1.727 m)   Wt 185 lb (83.9 kg)   BMI 28.13 kg/m    Physical Exam  Constitutional: He is oriented to person, place, and time. He appears well-developed and well-nourished.  Vital signs have been reviewed and are stable. Gen. appearance the patient is well-developed and well-nourished with normal grooming and hygiene.   Musculoskeletal:  GAIT IS abnormal  Neurological: He is alert and oriented to person, place, and time.  Skin: Skin is warm and dry. No erythema.  Psychiatric: He has a normal mood and affect.  Vitals reviewed.   Ortho Exam  Right lower extremity leg length equal  to left hip flexion painful at 90 degrees but he can flex to 120 degrees he cannot flex forward bend is back enough to get to his shoes for tying them without pain hip is stable strength is normal skin is intact pulses are excellent sensation is normal lymph nodes are negative he does have palpable tenderness in the groin   Medical decision-making  Imaging: See report but x-ray today shows severe degenerative arthritis lower lumbar spine mild degenerative changes right hip most noted on the lateral x-ray    Encounter Diagnoses  Name Primary?  . Right hip pain   . Primary osteoarthritis of right hip Yes    Exam consistent with hip pathology, recommend intra-articular injection of steroid  and lidocaine in the right hip follow-up 4 weeks use a cane left hand     Arther Abbott, MD 02/17/2017 11:13 AM

## 2017-02-17 NOTE — Patient Instructions (Signed)
We will call you and schedule you for a hip injection at [facility]

## 2017-02-22 NOTE — Telephone Encounter (Signed)
Letter sent to Dr Hilma Favors.

## 2017-02-24 ENCOUNTER — Telehealth: Payer: Self-pay | Admitting: Orthopedic Surgery

## 2017-02-24 NOTE — Telephone Encounter (Signed)
Steve Ramirez called and wanted to know the status of him being able to get a hip injection at [facility].  Would you please call him at [phone]?

## 2017-02-24 NOTE — Telephone Encounter (Signed)
I spoke to patient, to advise I am waiting to hear from [place] about if patient can hold blood thinners for the injection. Patient states he will contact Dr Hilma Favors.

## 2017-03-01 NOTE — Telephone Encounter (Signed)
Dr Mannie Stabile will be his new primary care. He has stopped by clinic to let us know. He states his hip is feeling better, he will see Dr Mannie Stabile in March. He will let us know something after he has seen his new PCP if he needs hip injection.

## 2017-03-17 ENCOUNTER — Ambulatory Visit: Payer: Medicare HMO | Admitting: Orthopedic Surgery

## 2017-04-01 ENCOUNTER — Ambulatory Visit (INDEPENDENT_AMBULATORY_CARE_PROVIDER_SITE_OTHER): Payer: Medicare HMO | Admitting: Family Medicine

## 2017-04-01 ENCOUNTER — Encounter: Payer: Self-pay | Admitting: Family Medicine

## 2017-04-01 ENCOUNTER — Other Ambulatory Visit: Payer: Self-pay

## 2017-04-01 VITALS — BP 160/80 | HR 60 | Temp 98.4°F | Resp 16 | Ht 68.0 in | Wt 191.8 lb

## 2017-04-01 DIAGNOSIS — I10 Essential (primary) hypertension: Secondary | ICD-10-CM

## 2017-04-01 DIAGNOSIS — I639 Cerebral infarction, unspecified: Secondary | ICD-10-CM | POA: Diagnosis not present

## 2017-04-01 DIAGNOSIS — R252 Cramp and spasm: Secondary | ICD-10-CM

## 2017-04-01 DIAGNOSIS — E782 Mixed hyperlipidemia: Secondary | ICD-10-CM

## 2017-04-01 DIAGNOSIS — Z23 Encounter for immunization: Secondary | ICD-10-CM

## 2017-04-01 DIAGNOSIS — I25118 Atherosclerotic heart disease of native coronary artery with other forms of angina pectoris: Secondary | ICD-10-CM

## 2017-04-01 DIAGNOSIS — G629 Polyneuropathy, unspecified: Secondary | ICD-10-CM

## 2017-04-01 DIAGNOSIS — Z79899 Other long term (current) drug therapy: Secondary | ICD-10-CM | POA: Diagnosis not present

## 2017-04-01 DIAGNOSIS — E1140 Type 2 diabetes mellitus with diabetic neuropathy, unspecified: Secondary | ICD-10-CM

## 2017-04-01 DIAGNOSIS — E039 Hypothyroidism, unspecified: Secondary | ICD-10-CM | POA: Diagnosis not present

## 2017-04-01 DIAGNOSIS — N39498 Other specified urinary incontinence: Secondary | ICD-10-CM | POA: Diagnosis not present

## 2017-04-01 MED ORDER — LISINOPRIL 40 MG PO TABS: 40.0000 mg | ORAL_TABLET | Freq: Every day | ORAL | 3 refills | Status: DC

## 2017-04-01 NOTE — Progress Notes (Signed)
Patient ID: Steve Ramirez, male    DOB: 31-Mar-1935, 82 y.o.   MRN: [id number]  Chief Complaint  Patient presents with  . Establish Care    needs new PCP due to office inconsistencies at old PCP office.   . Urinary Incontinence    trying to get condom catheters    Allergies Patient has no known allergies.  Subjective:   Steve Ramirez is a 82 y.o. male who presents to [facility] today.  HPI Here to establish care as a new patient visit. Has previously been seen by Dr. Hilma Favors. Presents today with his wife for evaluation.  Presents with complaints mainly associated with urinary incontinence and request for condom catheters and adult diapers/pads.   Steve Ramirez has a history of CVA in the past.  Has been evaluated by neurology and has had several strokes despite being on dual aspirin and antiplatelet therapy.  He reports that after he had first stroke he has residual symptoms were that he had right arm trouble and could not write well and has had urinary incontinence.  He reports that after his last stroke in 11/2016 he is incontinence got worse. Occasionally has a sensation that has to urinate but often he reports that he goes to the bathroom and his undergarments are wet without having known that he has incontinence.  He reports when he does go on his own that his stream of urine ok. No urinary hesitancy. Wears a urinary pad all the time, with an example being a Depends undergarment.  He reports that he would prefer to use a condom cath because the wet depends undergarments bother him and irritates the skin.  He reports he does have sensation in his genital area but the depends undergarments fit tight and he does not noticed the cool wetness to the skin. Has tried the condom caths with the [facility] and tried to get a prescription by previous PCP but told had to come in for a visit and then had trouble getting an appointment. Tired a variety of types and  would like the condom cath.   He reports that he has not had any urologic evaluation.  He does not know if he has any diagnoses of urge or overflow urinary incontinence.  All he knows is that he does have incontinence.  He reports that he has not had any urodynamic studies or testing.  He thinks his urine was checked for infection but he is not sure.  He reports that he did have prostate exam by Dr. Hilma Favors.  He reports he would be agreeable to seeing a urologist for an evaluation and just for a diagnosis of what type of condition he actually has.  He does not want me to do a prostate exam on him today and would prefer seeing a male urologist.  Patient reports she was placed on thyroid medication may be a year or so ago because he was told he would feel better on it and he was borderline.  Denies any weight gain, constipation, or energy changes.  Has been on his blood pressure medications for a long time.  Reports that he has no specific reason for being placed on the Spironolactone.  He denies any congestive heart failure or liver disease.  He has been on the fluid medications for quite some time.  He is unsure if they worsen his bladder symptoms.  He reports his blood sugars run well.  He denies any hypoglycemic symptoms.  He reports that his blood pressure does usually run in the 150s.  He denies any current chest pain or shortness of breath.  He does use the long-acting nitrate.  He is followed by cardiology.  He is also been seen by neurology for his strokes.  He was told there is nothing else he could do regarding medication to decrease his risk.  He was told that if he has a subsequent stroke or symptoms he should go to [facility].  He reports his mood is good.  He is active in the community.  He is active in his church.  He denies any symptoms or feelings of depressed mood.   Past Medical History:  Diagnosis Date  . Coronary atherosclerosis of native coronary artery   . Essential  hypertension, benign   . GIB (gastrointestinal bleeding)    Colonic diverticular bleed  . History of melanoma   . History of TIAs   . Stroke ([place])   . Type 2 diabetes mellitus ([place])     Past Surgical History:  Procedure Laterality Date  . COLONOSCOPY  08/13/2010   Procedure: COLONOSCOPY;  Surgeon: Rogene Houston, MD;  Location: [facility];  Service: Endoscopy;  Laterality: N/A;  . KNEE SURGERY     arthroscopic  . LEFT HEART CATHETERIZATION WITH CORONARY ANGIOGRAM N/A 11/24/2010   Procedure: LEFT HEART CATHETERIZATION WITH CORONARY ANGIOGRAM;  Surgeon: Josue Hector, MD;  Location: [facility] CATH LAB;  Service: Cardiovascular;  Laterality: N/A;  . neck fusion     years ago    Family History  Problem Relation Age of Onset  . Coronary artery disease Father   . Asthma Unknown   . Emphysema Mother   . Coronary artery disease Paternal Uncle   . Brain cancer Brother      Social History   Socioeconomic History  . Marital status: Married    Spouse name: Not on file  . Number of children: Not on file  . Years of education: Not on file  . Highest education level: Not on file  Occupational History  . Occupation: retired  Scientific laboratory technician  . Financial resource strain: Not on file  . Food insecurity:    Worry: Not on file    Inability: Not on file  . Transportation needs:    Medical: Not on file    Non-medical: Not on file  Tobacco Use  . Smoking status: Former Smoker    Types: Cigarettes    Last attempt to quit: 01/13/1980    Years since quitting: 37.2  . Smokeless tobacco: Never Used  Substance and Sexual Activity  . Alcohol use: Yes    Comment: occasionally  . Drug use: No  . Sexual activity: Not Currently    Partners: Female  Lifestyle  . Physical activity:    Days per week: Not on file    Minutes per session: Not on file  . Stress: Not on file  Relationships  . Social connections:    Talks on phone: Not on file    Gets together: Not on file    Attends religious  service: Not on file    Active member of club or organization: Not on file    Attends meetings of clubs or organizations: Not on file    Relationship status: Not on file  Other Topics Concern  . Not on file  Social History Narrative   Lives with wife. Grew up in [place] and after college moved to [place]. Got  married. Moved to [place] and then to [place], [place].    Attends [employer].    Masters health education, administration   3 children   Caffeine- 1 pot coffee daily.   Daughter is a Education officer, museum. Son is a Higher education careers adviser. Son is in [place].        Review of Systems  Constitutional: Negative for activity change, chills, fatigue, fever and unexpected weight change.  HENT: Negative for nosebleeds, rhinorrhea, sneezing, trouble swallowing and voice change.   Eyes: Negative for visual disturbance.  Gastrointestinal: Negative for abdominal distention, constipation and vomiting.  Genitourinary: Positive for frequency and urgency. Negative for decreased urine volume, difficulty urinating, dysuria, enuresis, flank pain, hematuria, penile pain, penile swelling and testicular pain.       Incontinence since stroke.   Musculoskeletal: Negative for back pain and myalgias.  Skin: Negative for rash.  Neurological: Negative for dizziness, weakness and numbness.  Hematological: Negative for adenopathy. Does not bruise/bleed easily.  Psychiatric/Behavioral: Negative for agitation, behavioral problems, decreased concentration, dysphoric mood, self-injury and suicidal ideas.     Objective:   BP (!) 160/80 (BP Location: Left Arm, Patient Position: Sitting, Cuff Size: Normal)   Pulse 60   Temp 98.4 F (36.9 C) (Temporal)   Resp 16   Ht 5\' 8"  (1.727 m)   Wt 191 lb 12 oz (87 kg)   SpO2 98%   BMI 29.16 kg/m   Physical Exam  Constitutional: He is oriented to person, place, and time. He appears well-developed and well-nourished.  HENT:  Head: Normocephalic and  atraumatic.  Eyes: Pupils are equal, round, and reactive to light. EOM are normal.  Neck: Normal range of motion. Neck supple. No thyromegaly present.  Cardiovascular: Normal rate, regular rhythm and normal heart sounds.  Pulses:      Dorsalis pedis pulses are 1+ on the right side, and 1+ on the left side.       Posterior tibial pulses are 1+ on the right side, and 1+ on the left side.  Pulmonary/Chest: Effort normal and breath sounds normal.  Abdominal: Soft. Bowel sounds are normal.  Genitourinary:  Genitourinary Comments: Deferred by patient.   Musculoskeletal: Normal range of motion. He exhibits no edema.  Feet:  Right Foot:  Protective Sensation: 10 sites tested. 6 sites sensed.  Left Foot:  Protective Sensation: 10 sites tested. 6 sites sensed.  Neurological: He is alert and oriented to person, place, and time. No cranial nerve deficit. Coordination normal.  Speech normal but mild delay in answering questions at times. Memory intact to immediate, recent, and remote recall.   Skin: Skin is warm, dry and intact.  Psychiatric: He has a normal mood and affect. His behavior is normal. Judgment and thought content normal.  Vitals reviewed.  Diabetic Foot Form - Detailed   Diabetic Foot Exam - detailed Diabetic Foot exam was performed with the following findings:  Yes 04/01/2017  1:57 PM  Visual Foot Exam completed.:  Yes  Can the patient see the bottom of their feet?:  No Are the shoes appropriate in style and fit?:  No Is there swelling or and abnormal foot shape?:  No Is there a claw toe deformity?:  No Is there elevated skin temparature?:  No Is there foot or ankle muscle weakness?:  No Normal Range of Motion:  Yes Pulse Foot Exam completed.:  Yes  Right posterior Tibialias:  Present Left posterior Tibialias:  Present  Right Dorsalis Pedis:  Present Left Dorsalis Pedis:  Present  Sensory Foot Exam Completed.:  Yes Semmes-Weinstein Monofilament Test  L Site 1-Great Toe:  Neg      Comments:  Patient with decreased sensation on area 1, 2, 3, , 4, 5,9 bilaterally    Depression screen [facility] 2/9 04/01/2017  Decreased Interest 1  Down, Depressed, Hopeless 1  PHQ - 2 Score 2  Altered sleeping 1  Tired, decreased energy 0  Change in appetite 0  Feeling bad or failure about yourself  1  Trouble concentrating 0  Moving slowly or fidgety/restless 0  Suicidal thoughts 0  PHQ-9 Score 4  Difficult doing work/chores Somewhat difficult    Assessment and Plan  1. Atherosclerosis of native coronary artery of native heart with stable angina pectoris ([place]) Follow up with cardiology, continue medications.  2. Type 2 diabetes mellitus with diabetic neuropathy, without long-term current use of insulin (H[facility]) Check status. Lifestyle modifications discussed with patient including a diet emphasizing vegetables, fruits, and whole grains. Limiting intake of sodium to less than 2,400 mg per day.  Recommendations discussed include consuming low-fat dairy products, poultry, fish, legumes, non-tropical vegetable oils, and nuts; and limiting intake of sweets, sugar-sweetened beverages, and red meat. - HM Diabetes Foot Exam - Hemoglobin A1c - Microalbumin / creatinine urine ratio - lisinopril (PRINIVIL,ZESTRIL) 40 MG tablet; Take 1 tablet (40 mg total) by mouth daily.  Dispense: 90 tablet; Refill: 3  3. Mixed hyperlipidemia Hyperlipidemia and the associated risk of ASCVD were discussed today. Primary vs. Secondary prevention of ASCVD were discussed and how it relates to patient morbidity, mortality, and quality of life. Shared decision making with patient including the risks of statins vs.benefits of ASCVD risk reduction discussed.  Risks of stains discussed including myopathy, rhabdomyoloysis, liver problems, increased risk of diabetes discussed. We discussed heart healthy diet, lifestyle modifications, risk factor modifications, and adherence to the recommended treatment plan. We discussed the  need to periodically monitor lipid panel and liver function tests while on statin therapy.  Check labs and adjust for target LDL of less than 70.  - Lipid panel  4. Immunization due Ordered.  - Td : Tetanus/diphtheria >7yo Preservative  free  5. Hypothyroidism, unspecified type Continue levothyroxine.  - TSH  6. Leg cramps Chronic. Use of sporadic OTC potassium. Discontinue use of K OTC. Check labs including potassium.  - VITAMIN D 25 Hydroxy (Vit-D Deficiency, Fractures) - Magnesium  7. Peripheral Neuropathy, suspect secondary to DM.  Check levels.  - Vitamin B12  8. High risk medication use Ordered.  - COMPLETE METABOLIC PANEL WITH GFR  9.  Urinary incontinence Will write scripts for urinary condom catheters and adult diapers as needed. Send to urologist for evaluation and dx. Patients symptoms affect his quality of life and cause mental stress. Evaluation for possible medication choices, but need urodynamic studies prior.  - Urine Culture - Urinalysis, Routine w reflex microscopic - Ambulatory referral to Urology 10. CVA Continue asa and plavix. Aggressive risk factor modification discussed.  Reviewed last neurologist note.  Discussed maintaining LDL at goal and blood pressure at recommended level of less than 130/80. 11.  Hypertension, goal less than 130/80. Increase lisinopril to 40 mg p.o. daily.  DC HCTZ.  Continue spironolactone at this time.  Did discuss that we will try to discontinue this medication because probably contributes to urinary symptoms.  Blood pressure goal discussed.  Total need to check BMP at follow-up.  Check labs today.  Follow-up in 2-4 weeks for recheck of blood pressure.  Discussed DASH diet.  Information given.  Request records from primary care physician. Patient to send information regarding urinary supplies.  Patient was told to call with any questions, concerns, or worrisome symptoms and keep scheduled follow-up. Return in about 1 month (around  04/29/2017). Caren Macadam, MD 04/03/2017

## 2017-04-01 NOTE — Patient Instructions (Signed)
Stopping the lisinipril /HCTZ and start the lisinipril 40 mg a day Follow up in 1 month for recheck of BP Bring your cuff to the visit and check some BP and bring in the readings.  Referral placed for urologist.   Take zyrtec at night Take cholesterol medication at night

## 2017-04-03 ENCOUNTER — Telehealth: Payer: Self-pay | Admitting: Family Medicine

## 2017-04-03 ENCOUNTER — Encounter: Payer: Self-pay | Admitting: Family Medicine

## 2017-04-03 NOTE — Telephone Encounter (Signed)
Please call and check with patient whether we need to send in a prescription for the condom catheters or not.  Were they planning on sending [place] the information or do we need to contact a medical supply company for them.  Please let us know.  I do not want this to fall through the cracks and him not get the supplies.

## 2017-04-06 NOTE — Telephone Encounter (Signed)
Spoke to patient. He states the medical supply company was supposed to fax [place] a form to complete for the catheters, but he is now wanting to see urology first to see if he really needs them. He will be back in touch after his appointment to let us know.

## 2017-04-15 ENCOUNTER — Telehealth: Payer: Self-pay | Admitting: Family Medicine

## 2017-04-15 NOTE — Telephone Encounter (Signed)
Patient left message on nurse line stating he has been recording blood pressures of [phone] systolic and 75 diastolic.

## 2017-04-16 MED ORDER — AMLODIPINE BESYLATE 10 MG PO TABS
10.0000 mg | ORAL_TABLET | Freq: Every day | ORAL | 3 refills | Status: DC
Start: 2017-04-16 — End: 2018-08-03

## 2017-04-16 NOTE — Telephone Encounter (Signed)
Please call and advise that these readings are very high. We had increased the lisinopril at his OV to 40 mg po qd. Is he taking this? We discontinued the HCTZ.  I would like to add norvasc 10 mg po qd. He needs to follow up. If is BP is continuing to run high or is having associated HA, vision changes, SOB or CP, that he needs to go to the [facility] or the ED. Please advise. Tell him that we have sent the medication to the pharmacy. Needs to follow up next week. Gwen Her. Mannie Stabile, MD

## 2017-04-19 NOTE — Telephone Encounter (Signed)
Patient informed of message below, verbalized understanding.  

## 2017-04-26 ENCOUNTER — Emergency Department (HOSPITAL_COMMUNITY): Payer: Medicare HMO

## 2017-04-26 ENCOUNTER — Encounter (HOSPITAL_COMMUNITY): Payer: Self-pay | Admitting: Emergency Medicine

## 2017-04-26 ENCOUNTER — Observation Stay (HOSPITAL_COMMUNITY)
Admission: EM | Admit: 2017-04-26 | Discharge: 2017-04-27 | Disposition: A | Discharge status: Home / Self Care | Payer: Medicare HMO | Attending: Internal Medicine | Admitting: Internal Medicine

## 2017-04-26 ENCOUNTER — Other Ambulatory Visit: Payer: Self-pay

## 2017-04-26 DIAGNOSIS — R4781 Slurred speech: Secondary | ICD-10-CM

## 2017-04-26 DIAGNOSIS — I10 Essential (primary) hypertension: Secondary | ICD-10-CM | POA: Diagnosis present

## 2017-04-26 DIAGNOSIS — Z87891 Personal history of nicotine dependence: Secondary | ICD-10-CM | POA: Diagnosis not present

## 2017-04-26 DIAGNOSIS — G459 Transient cerebral ischemic attack, unspecified: Secondary | ICD-10-CM | POA: Diagnosis not present

## 2017-04-26 DIAGNOSIS — R200 Anesthesia of skin: Secondary | ICD-10-CM | POA: Insufficient documentation

## 2017-04-26 DIAGNOSIS — E119 Type 2 diabetes mellitus without complications: Secondary | ICD-10-CM | POA: Diagnosis not present

## 2017-04-26 DIAGNOSIS — E785 Hyperlipidemia, unspecified: Secondary | ICD-10-CM | POA: Diagnosis present

## 2017-04-26 DIAGNOSIS — Z8673 Personal history of transient ischemic attack (TIA), and cerebral infarction without residual deficits: Secondary | ICD-10-CM | POA: Insufficient documentation

## 2017-04-26 DIAGNOSIS — I639 Cerebral infarction, unspecified: Secondary | ICD-10-CM | POA: Diagnosis present

## 2017-04-26 LAB — DIFFERENTIAL
BASOS PCT: 1 %
Basophils Absolute: 0 10*3/uL (ref 0.0–0.1)
EOS PCT: 5 %
Eosinophils Absolute: 0.3 10*3/uL (ref 0.0–0.7)
Lymphocytes Relative: 23 %
Lymphs Abs: 1.3 10*3/uL (ref 0.7–4.0)
MONO ABS: 0.6 10*3/uL (ref 0.1–1.0)
MONOS PCT: 11 %
NEUTROS ABS: 3.4 10*3/uL (ref 1.7–7.7)
Neutrophils Relative %: 60 %

## 2017-04-26 LAB — COMPREHENSIVE METABOLIC PANEL
ALK PHOS: 41 U/L (ref 38–126)
ALT: 27 U/L (ref 17–63)
AST: 20 U/L (ref 15–41)
Albumin: 4.5 g/dL (ref 3.5–5.0)
Anion gap: 11 (ref 5–15)
BUN: 30 mg/dL — ABNORMAL HIGH (ref 6–20)
CALCIUM: 10 mg/dL (ref 8.9–10.3)
CHLORIDE: 109 mmol/L (ref 101–111)
CO2: 21 mmol/L — ABNORMAL LOW (ref 22–32)
CREATININE: 0.97 mg/dL (ref 0.61–1.24)
Glucose, Bld: 141 mg/dL — ABNORMAL HIGH (ref 65–99)
Potassium: 3.9 mmol/L (ref 3.5–5.1)
Sodium: 141 mmol/L (ref 135–145)
Total Bilirubin: 0.6 mg/dL (ref 0.3–1.2)
Total Protein: 7.6 g/dL (ref 6.5–8.1)

## 2017-04-26 LAB — CBC
HCT: 41.6 % (ref 39.0–52.0)
Hemoglobin: 13.6 g/dL (ref 13.0–17.0)
MCH: 26.9 pg (ref 26.0–34.0)
MCHC: 32.7 g/dL (ref 30.0–36.0)
MCV: 82.2 fL (ref 78.0–100.0)
Platelets: 254 10*3/uL (ref 150–400)
RBC: 5.06 MIL/uL (ref 4.22–5.81)
RDW: 14.4 % (ref 11.5–15.5)
WBC: 5.6 10*3/uL (ref 4.0–10.5)

## 2017-04-26 LAB — PROTIME-INR
INR: 1.06
Prothrombin Time: 13.7 seconds (ref 11.4–15.2)

## 2017-04-26 LAB — GLUCOSE, CAPILLARY
GLUCOSE-CAPILLARY: 101 mg/dL — AB (ref 65–99)
Glucose-Capillary: 179 mg/dL — ABNORMAL HIGH (ref 65–99)

## 2017-04-26 LAB — APTT: aPTT: 28 seconds (ref 24–36)

## 2017-04-26 LAB — ETHANOL

## 2017-04-26 MED ORDER — ONDANSETRON HCL 4 MG/2ML IJ SOLN: 4.0000 mg | Freq: Four times a day (QID) | INTRAMUSCULAR | Status: DC | PRN

## 2017-04-26 MED ORDER — ISOSORBIDE MONONITRATE ER 30 MG PO TB24
30.0000 mg | ORAL_TABLET | Freq: Every day | ORAL | Status: DC
  Administered 2017-04-27: 30 mg via ORAL
  Filled 2017-04-26: qty 1

## 2017-04-26 MED ORDER — PRAVASTATIN SODIUM 10 MG PO TABS
10.0000 mg | ORAL_TABLET | Freq: Every day | ORAL | Status: DC
  Administered 2017-04-26: 10 mg via ORAL
  Filled 2017-04-26: qty 1

## 2017-04-26 MED ORDER — ACETAMINOPHEN 325 MG PO TABS: 650.0000 mg | ORAL_TABLET | Freq: Four times a day (QID) | ORAL | Status: DC | PRN

## 2017-04-26 MED ORDER — SODIUM CHLORIDE 0.9 % IV SOLN: 250.0000 mL | INTRAVENOUS | Status: DC | PRN

## 2017-04-26 MED ORDER — ROPINIROLE HCL 0.25 MG PO TABS
ORAL_TABLET | ORAL | Status: AC
  Filled 2017-04-26: qty 2

## 2017-04-26 MED ORDER — ESCITALOPRAM OXALATE 10 MG PO TABS
10.0000 mg | ORAL_TABLET | Freq: Every day | ORAL | Status: DC
  Administered 2017-04-27: 10 mg via ORAL
  Filled 2017-04-26: qty 1

## 2017-04-26 MED ORDER — AMLODIPINE BESYLATE 5 MG PO TABS
10.0000 mg | ORAL_TABLET | Freq: Every day | ORAL | Status: DC
  Administered 2017-04-27: 10 mg via ORAL
  Filled 2017-04-26: qty 2

## 2017-04-26 MED ORDER — ENOXAPARIN SODIUM 40 MG/0.4ML ~~LOC~~ SOLN
40.0000 mg | SUBCUTANEOUS | Status: DC
  Administered 2017-04-26: 40 mg via SUBCUTANEOUS
  Filled 2017-04-26: qty 0.4

## 2017-04-26 MED ORDER — INSULIN ASPART 100 UNIT/ML ~~LOC~~ SOLN: 0.0000 [IU] | Freq: Every day | SUBCUTANEOUS | Status: DC

## 2017-04-26 MED ORDER — ONDANSETRON HCL 4 MG PO TABS: 4.0000 mg | ORAL_TABLET | Freq: Four times a day (QID) | ORAL | Status: DC | PRN

## 2017-04-26 MED ORDER — TAMSULOSIN HCL 0.4 MG PO CAPS
0.4000 mg | ORAL_CAPSULE | Freq: Every day | ORAL | Status: DC
  Administered 2017-04-27: 0.4 mg via ORAL
  Filled 2017-04-26: qty 1

## 2017-04-26 MED ORDER — FENOFIBRATE 160 MG PO TABS
160.0000 mg | ORAL_TABLET | Freq: Every day | ORAL | Status: DC
  Administered 2017-04-27: 160 mg via ORAL
  Filled 2017-04-26: qty 1

## 2017-04-26 MED ORDER — ACETAMINOPHEN 650 MG RE SUPP: 650.0000 mg | Freq: Four times a day (QID) | RECTAL | Status: DC | PRN

## 2017-04-26 MED ORDER — NITROGLYCERIN 0.4 MG SL SUBL: 0.4000 mg | SUBLINGUAL_TABLET | SUBLINGUAL | Status: DC | PRN

## 2017-04-26 MED ORDER — STROKE: EARLY STAGES OF RECOVERY BOOK
Freq: Once | Status: AC
  Administered 2017-04-26: 19:00:00
  Filled 2017-04-26 (×2): qty 1

## 2017-04-26 MED ORDER — CLOPIDOGREL BISULFATE 75 MG PO TABS
75.0000 mg | ORAL_TABLET | Freq: Every day | ORAL | Status: DC
  Administered 2017-04-27: 75 mg via ORAL
  Filled 2017-04-26: qty 1

## 2017-04-26 MED ORDER — LISINOPRIL 10 MG PO TABS
40.0000 mg | ORAL_TABLET | Freq: Every day | ORAL | Status: DC
  Administered 2017-04-27: 40 mg via ORAL
  Filled 2017-04-26: qty 4

## 2017-04-26 MED ORDER — SODIUM CHLORIDE 0.9% FLUSH
3.0000 mL | Freq: Two times a day (BID) | INTRAVENOUS | Status: DC
  Administered 2017-04-26 – 2017-04-27 (×2): 3 mL via INTRAVENOUS

## 2017-04-26 MED ORDER — OMEGA-3-ACID ETHYL ESTERS 1 G PO CAPS
1.0000 | ORAL_CAPSULE | Freq: Every day | ORAL | Status: DC
  Administered 2017-04-27: 1 g via ORAL
  Filled 2017-04-26: qty 1

## 2017-04-26 MED ORDER — SPIRONOLACTONE 25 MG PO TABS
25.0000 mg | ORAL_TABLET | Freq: Every day | ORAL | Status: DC
  Administered 2017-04-27: 25 mg via ORAL
  Filled 2017-04-26: qty 1

## 2017-04-26 MED ORDER — INSULIN ASPART 100 UNIT/ML ~~LOC~~ SOLN
0.0000 [IU] | Freq: Three times a day (TID) | SUBCUTANEOUS | Status: DC
  Administered 2017-04-27: 1 [IU] via SUBCUTANEOUS
  Administered 2017-04-27: 2 [IU] via SUBCUTANEOUS

## 2017-04-26 MED ORDER — SAW PALMETTO 450 MG PO CAPS: 1.0000 | ORAL_CAPSULE | Freq: Every day | ORAL | Status: DC

## 2017-04-26 MED ORDER — LEVOTHYROXINE SODIUM 50 MCG PO TABS
50.0000 ug | ORAL_TABLET | Freq: Every day | ORAL | Status: DC
  Administered 2017-04-27: 50 ug via ORAL
  Filled 2017-04-26: qty 1

## 2017-04-26 MED ORDER — SODIUM CHLORIDE 0.9% FLUSH: 3.0000 mL | INTRAVENOUS | Status: DC | PRN

## 2017-04-26 MED ORDER — ASPIRIN EC 81 MG PO TBEC
81.0000 mg | DELAYED_RELEASE_TABLET | Freq: Every day | ORAL | Status: DC
  Administered 2017-04-27: 81 mg via ORAL
  Filled 2017-04-26: qty 1

## 2017-04-26 MED ORDER — ROPINIROLE HCL 0.25 MG PO TABS
0.5000 mg | ORAL_TABLET | Freq: Two times a day (BID) | ORAL | Status: DC
  Administered 2017-04-26: 0.5 mg via ORAL
  Filled 2017-04-26 (×4): qty 1

## 2017-04-26 MED ORDER — ADULT MULTIVITAMIN W/MINERALS CH
1.0000 | ORAL_TABLET | Freq: Every day | ORAL | Status: DC
  Administered 2017-04-27: 1 via ORAL
  Filled 2017-04-26: qty 1

## 2017-04-26 MED ORDER — METOPROLOL SUCCINATE ER 50 MG PO TB24
50.0000 mg | ORAL_TABLET | Freq: Every day | ORAL | Status: DC
  Administered 2017-04-27: 50 mg via ORAL
  Filled 2017-04-26: qty 1

## 2017-04-26 MED ORDER — LORATADINE 10 MG PO TABS
10.0000 mg | ORAL_TABLET | Freq: Every day | ORAL | Status: DC
  Administered 2017-04-27: 10 mg via ORAL
  Filled 2017-04-26: qty 1

## 2017-04-26 NOTE — ED Triage Notes (Signed)
Slurred speech,left hand numbness started 40 minutes ago. EDp at bedside

## 2017-04-26 NOTE — ED Provider Notes (Signed)
Emergency Department Provider Note   I have reviewed the triage vital signs and the nursing notes.   HISTORY  Chief Complaint Code Stroke   HPI Steve Ramirez is a 82 y.o. male with PMH of CAD, CVA, DM, and HTN presents to the emergency department with acute onset slurred speech with left hand numbness.  The patient reports last normal at 2 PM today.  He was watching the news when symptoms began abruptly.  He had similar episode in November of last year where he developed acute speech change.  He was found to have an acute stroke at that time.  He has been compliant with home medications since that event.  He reports some numbness in the left hand and specifically in his middle and ring fingers.  Denies any weakness/numbness in the arm/leg/face.    Past Medical History:  Diagnosis Date  . Coronary atherosclerosis of native coronary artery   . Essential hypertension, benign   . GIB (gastrointestinal bleeding)    Colonic diverticular bleed  . History of melanoma   . History of TIAs   . Stroke ([place])   . Type 2 diabetes mellitus [facility])     Patient Active Problem List   Diagnosis Date Noted  . Near syncope 12/03/2016  . CVA (cerebral vascular accident) ([place]) 12/03/2016  . Right hemiparesis ([place]) 12/02/2016  . Acute ischemic stroke ([place]) 12/02/2016  . Essential hypertension 12/02/2016  . Dysphagia 12/02/2016  . Acute CVA (cerebrovascular accident) ([place]) 09/13/2015  . TIA (transient ischemic attack) 11/29/2014  . Shoulder stiffness 09/05/2012  . Right rotator cuff tear 08/16/2012  . Shoulder subluxation, right 08/09/2012  . Elevated transaminase level 11/05/2011  . Coronary atherosclerosis of native coronary artery 11/26/2010  . Essential hypertension, benign 11/23/2010  . Type 2 diabetes mellitus ([place]) 11/23/2010  . Hyperlipidemia 11/23/2010  . History of TIAs 11/23/2010    Past Surgical History:  Procedure Laterality Date  . COLONOSCOPY  08/13/2010   Procedure:  COLONOSCOPY;  Surgeon: Rogene Houston, MD;  [facility];  Service: Endoscopy;  Laterality: N/A;  . KNEE SURGERY     arthroscopic  . LEFT HEART CATHETERIZATION WITH CORONARY ANGIOGRAM N/A 11/24/2010   Procedure: LEFT HEART CATHETERIZATION WITH CORONARY ANGIOGRAM;  Surgeon: Josue Hector, MD;  Location: [facility] CATH LAB;  Service: Cardiovascular;  Laterality: N/A;  . neck fusion     years ago      Allergies Patient has no known allergies.  Family History  Problem Relation Age of Onset  . Coronary artery disease Father   . Asthma Unknown   . Emphysema Mother   . Coronary artery disease Paternal Uncle   . Brain cancer Brother     Social History Social History   Tobacco Use  . Smoking status: Former Smoker    Types: Cigarettes    Last attempt to quit: 01/13/1980    Years since quitting: 37.3  . Smokeless tobacco: Never Used  Substance Use Topics  . Alcohol use: Yes    Comment: occasionally  . Drug use: No    Review of Systems  Constitutional: No fever/chills Eyes: No visual changes. ENT: No sore throat. Cardiovascular: Denies chest pain. Respiratory: Denies shortness of breath. Gastrointestinal: No abdominal pain.  No nausea, no vomiting.  No diarrhea.  No constipation. Genitourinary: Negative for dysuria. Musculoskeletal: Negative for back pain. Skin: Negative for rash. Neurological: Negative for headaches. Positive speech changes with left hand numbness.   10-point ROS otherwise negative.  ____________________________________________  PHYSICAL EXAM:  VITAL SIGNS: Vitals:   04/26/17 1720 04/26/17 1835  BP: (!) 146/98 (!) 166/90  Pulse: 79 71  Resp: 18 16  Temp: 97.8 F (36.6 C)   SpO2: 99% 99%    Constitutional: Alert and oriented. Patient with some notable dysarthria on initial exam.  Eyes: Conjunctivae are normal. Head: Atraumatic. Nose: No congestion/rhinnorhea. Mouth/Throat: Mucous membranes are moist. Neck: No stridor.    Cardiovascular: Normal rate, regular rhythm. Good peripheral circulation. Grossly normal heart sounds.   Respiratory: Normal respiratory effort.  No retractions. Lungs CTAB. Gastrointestinal: Soft and nontender. No distention.  Musculoskeletal: No lower extremity tenderness nor edema. No gross deformities of extremities. Neurologic: Patient speech is slurred and slightly slow. Otherwise normal CN exam 2-12. No pronator drift. No weakness/numbness in the upper/lower extremities.  Skin:  Skin is warm, dry and intact. No rash noted.  ____________________________________________   LABS (all labs ordered are listed, but only abnormal results are displayed)  Labs Reviewed  COMPREHENSIVE METABOLIC PANEL - Abnormal; Notable for the following components:      Result Value   CO2 21 (*)    Glucose, Bld 141 (*)    BUN 30 (*)    All other components within normal limits  GLUCOSE, CAPILLARY - Abnormal; Notable for the following components:   Glucose-Capillary 101 (*)    All other components within normal limits  ETHANOL  PROTIME-INR  APTT  CBC  DIFFERENTIAL  RAPID URINE DRUG SCREEN, HOSP PERFORMED  URINALYSIS, ROUTINE W REFLEX MICROSCOPIC  HEMOGLOBIN A1C  LIPID PANEL  BASIC METABOLIC PANEL  CBC  I-STAT CHEM 8, ED  I-STAT TROPONIN, ED   ____________________________________________  EKG   EKG Interpretation  Date/Time:  Monday April 26 2017 15:04:09 EDT Ventricular Rate:  82 PR Interval:    QRS Duration: 167 QT Interval:  429 QTC Calculation: 502 R Axis:   -61 Text Interpretation:  Sinus rhythm Prolonged PR interval RBBB and LAFB No STEMI. Similar to prior.  Confirmed by Nanda Quinton [phone]) on 04/26/2017 3:25:50 PM      ____________________________________________  RADIOLOGY  Mr Brain Wo Contrast  Result Date: 04/26/2017 CLINICAL DATA:  Slurred speech, LEFT hand numbness, began earlier today. History of hypertension, diabetes, and prior stroke EXAM: MRI HEAD WITHOUT  CONTRAST TECHNIQUE: Multiplanar, multiecho pulse sequences of the brain and surrounding structures were obtained without intravenous contrast. COMPARISON:  CT head performed at 1458 hours.  MR head 02/02/2016. FINDINGS: The patient was unable to remain motionless for the exam. Small or subtle lesions could be overlooked. Brain: No evidence for acute infarction, hemorrhage, mass lesion, hydrocephalus, or extra-axial fluid. Generalized atrophy. Extensive focal and confluent T2 and FLAIR hyperintensities throughout the white matter, consistent with small vessel disease. Multiple areas of chronic lacunar infarction, most notable in the LEFT thalamus and LEFT basal ganglia, but also involving the brainstem and cerebellum, stable. Vascular: Flow voids are maintained in the carotid, basilar, and vertebral arteries. Skull and upper cervical spine: Normal marrow signal. Partial empty sella, doubtful significance. Sinuses/Orbits: Negative. Other: None. IMPRESSION: Atrophy and small vessel disease.  Old lacunar infarcts. No acute intracranial findings. Electronically Signed   By: Staci Righter M.D.   On: 04/26/2017 16:49   Ct Head Code Stroke Wo Contrast  Result Date: 04/26/2017 CLINICAL DATA:  Code stroke. Numbness left third and fourth fingers. EXAM: CT HEAD WITHOUT CONTRAST TECHNIQUE: Contiguous axial images were obtained from the base of the skull through the vertex without intravenous contrast. COMPARISON:  CT head 12/03/2016  FINDINGS: Brain: X generalized atrophy. Extensive chronic ischemic changes throughout the white matter. Chronic lacunar infarction in the left basal ganglia unchanged. Negative for acute infarct.  Negative for hemorrhage or mass. Vascular: Negative for hyperdense vessel Skull: Negative Sinuses/Orbits: Negative Other: None ASPECTS ([place] Stroke Program Early CT Score) - Ganglionic level infarction (caudate, lentiform nuclei, internal capsule, insula, M1-M3 cortex): 7 - Supraganglionic infarction  (M4-M6 cortex): 3 Total score (0-10 with 10 being normal): 10 IMPRESSION: 1. No acute intracranial abnormality. Extensive chronic ischemic changes similar to the prior study 2. ASPECTS is 10 3. These results were called by telephone at the time of interpretation on 04/26/2017 at 3:06 pm to Dr. Nanda Quinton , who verbally acknowledged these results. Electronically Signed   By: Franchot Gallo M.D.   On: 04/26/2017 15:06    ____________________________________________   PROCEDURES  Procedure(s) performed:   .Critical Care Performed by: Margette Fast, MD Authorized by: Margette Fast, MD   Critical care provider statement:    Critical care time (minutes):  35   Critical care time was exclusive of:  Separately billable procedures and treating other patients and teaching time   Critical care was necessary to treat or prevent imminent or life-threatening deterioration of the following conditions:  CNS failure or compromise   Critical care was time spent personally by me on the following activities:  Blood draw for specimens, development of treatment plan with patient or surrogate, discussions with consultants, evaluation of patient's response to treatment, examination of patient, obtaining history from patient or surrogate, ordering and performing treatments and interventions, ordering and review of laboratory studies, ordering and review of radiographic studies, pulse oximetry, re-evaluation of patient's condition and review of old charts   I assumed direction of critical care for this patient from another provider in my specialty: no      ____________________________________________   INITIAL IMPRESSION / [place] / ED COURSE  Pertinent labs & imaging results that were available during my care of the patient were reviewed by me and considered in my medical decision making (see chart for details).  She presents to the emergency department for evaluation of acute onset slurred  speech with numbness in the left hand.  Symptoms began 40 minutes prior to ED evaluation.  He has prior history of stroke which presented in a similar way in November.  He actually had 2 episodes around that same time.  He has been compliant with his medications.  I activated a code stroke and tele-neurology was immediately pulled up on screen.  Labs being drawn.  CT imaging ordered. No LVO symptoms.   03:06 PM Spoke with Neurology. CT head is negative for acute changes. Patient does have significant chronic ischemia but similar to prior study.   03:15 PM Neurology evaluated and does NOT recommend tPA. Left hand tingling/weakness resolved. Speech is improving but completely back to normal. Does not recommend any LVO w/u. Ordered MRI. Plan to admit for TIA. Patient took full dose ASA PTA.   Discussed patient's case with Hospitalist to request admission. Patient and family (if present) updated with plan. Care transferred to Hospitalist service.  I reviewed all nursing notes, vitals, pertinent old records, EKGs, labs, imaging (as available).  ____________________________________________  FINAL CLINICAL IMPRESSION(S) / ED DIAGNOSES  Final diagnoses:  TIA (transient ischemic attack)  Slurred speech    Note:  This document was prepared using Dragon voice recognition software and may include unintentional dictation errors.  Nanda Quinton, MD Emergency Medicine  Margette Fast, MD 04/26/17 1949

## 2017-04-26 NOTE — ED Notes (Signed)
Patient returned from MRI.

## 2017-04-26 NOTE — H&P (Signed)
History and Physical    LONZY MATO YKD:[id number] DOB: 1935-09-18 DOA: 04/26/2017  PCP: Caren Macadam, MD   Patient coming from: Home  Chief Complaint: L hand numbness and speech deficit  HPI: ARSHAD OBERHOLZER is a 82 y.o. male with medical history significant for hypertension, dyslipidemia, type 2 diabetes, CAD, prior TIAs and CVAs on aspirin and Plavix who presented to the emergency department with sudden onset left hand numbness as well as speech slurring and facial droop at approximately 1400 today.  He states that he was mowing his lawn and had just come inside to watch some TV when the symptoms occurred.  He states that he has been compliant with his home antiplatelet regimen and other medications.  He presented to the emergency department with an approximately 40 minutes with resolution of his symptoms. Patient denies any visual change, headache, significant motor weakness, or trouble with gait. He denies any significant weakness or numbness in his facial region. He follows with neurologist Dr. Leta Baptist in [place].   ED Course: Vital signs are noted to be stable and patient is currently on room air.  He is otherwise comfortable and denies any complaints.  CT of the head with no acute findings currently.  Laboratory data otherwise within normal limits.  Current NIHSS score noted to be 0.  Review of Systems: All others reviewed and otherwise negative.  Past Medical History:  Diagnosis Date  . Coronary atherosclerosis of native coronary artery   . Essential hypertension, benign   . GIB (gastrointestinal bleeding)    Colonic diverticular bleed  . History of melanoma   . History of TIAs   . Stroke ([place])   . Type 2 diabetes mellitus ([place])     Past Surgical History:  Procedure Laterality Date  . COLONOSCOPY  08/13/2010   Procedure: COLONOSCOPY;  Surgeon: Rogene Houston, MD;  Location[facility];  Service: Endoscopy;  Laterality: N/A;  . KNEE SURGERY     arthroscopic  . LEFT HEART CATHETERIZATION WITH CORONARY ANGIOGRAM N/A 11/24/2010   Procedure: LEFT HEART CATHETERIZATION WITH CORONARY ANGIOGRAM;  Surgeon: Josue Hector, MD;  Location: [facility] CATH LAB;  Service: Cardiovascular;  Laterality: N/A;  . neck fusion     years ago     reports that he quit smoking about 37 years ago. His smoking use included cigarettes. He has never used smokeless tobacco. He reports that he drinks alcohol. He reports that he does not use drugs.  No Known Allergies  Family History  Problem Relation Age of Onset  . Coronary artery disease Father   . Asthma Unknown   . Emphysema Mother   . Coronary artery disease Paternal Uncle   . Brain cancer Brother     Prior to Admission medications   Medication Sig Start Date End Date Taking? Authorizing Provider  amLODipine (NORVASC) 10 MG tablet Take 1 tablet (10 mg total) by mouth daily. 04/16/17   Caren Macadam, MD  aspirin EC 81 MG EC tablet Take 1 tablet (81 mg total) by mouth daily. 12/04/16   Orson Eva, MD  cetirizine (ZYRTEC) 10 MG tablet Take 10 mg by mouth daily.    [provider]  clopidogrel (PLAVIX) 75 MG tablet Take 1 tablet (75 mg total) by mouth daily. 08/08/12   Lendon Colonel, NP  escitalopram (LEXAPRO) 10 MG tablet 10 mg daily. 12/09/16   [provider]  fenofibrate (TRICOR) 145 MG tablet Take 145 mg by mouth daily.    [provider]  isosorbide mononitrate (IMDUR) 30 MG 24 hr tablet Take 1 tablet by mouth daily. 11/30/16   [provider]  levothyroxine (SYNTHROID, LEVOTHROID) 50 MCG tablet Take 1 tablet by mouth daily. 11/13/16   [provider]  lisinopril (PRINIVIL,ZESTRIL) 40 MG tablet Take 1 tablet (40 mg total) by mouth daily. 04/01/17   Caren Macadam, MD  metFORMIN (GLUCOPHAGE) 500 MG tablet Take 500 mg by mouth 2 (two) times daily.    [provider]  metoprolol succinate (TOPROL-XL) 50 MG 24 hr tablet Take 50 mg by mouth daily. Take  with or immediately following a meal.    [provider]  Multiple Vitamin (MULTIVITAMIN) tablet Take 1 tablet by mouth daily.      [provider]  nitroGLYCERIN (NITROSTAT) 0.4 MG SL tablet Place 0.4 mg under the tongue every 5 (five) minutes as needed for chest pain.    [provider]  Omega-3 Fatty Acids (OMEGA 3 PO) Take 1,000 mg by mouth daily.     [provider]  pravastatin (PRAVACHOL) 10 MG tablet Take 1 tablet by mouth daily. 09/04/16   [provider]  rOPINIRole (REQUIP) 0.5 MG tablet Take 1 tablet by mouth 2 (two) times daily. 09/15/16   [provider]  Saw [place]to 450 MG CAPS Take 1 capsule by mouth daily.    [provider]  spironolactone (ALDACTONE) 25 MG tablet Take 1 tablet by mouth daily. 09/28/16   [provider]  tamsulosin (FLOMAX) 0.4 MG CAPS capsule Take 0.4 mg by mouth daily.    [provider]  rosuvastatin (CRESTOR) 40 MG tablet Take 1 tablet (40 mg total) by mouth daily. 11/26/10 04/08/11  Theora Gianotti, NP    Physical Exam: Vitals:   04/26/17 1449  Weight: 85.7 kg (189 lb)    Constitutional: NAD, calm, comfortable Vitals:   04/26/17 1449  Weight: 85.7 kg (189 lb)   Eyes: lids and conjunctivae normal ENMT: Mucous membranes are moist.  Neck: normal, supple Respiratory: clear to auscultation bilaterally. Normal respiratory effort. No accessory muscle use.  Cardiovascular: Regular rate and rhythm, no murmurs. No extremity edema. Abdomen: no tenderness, no distention. Bowel sounds positive.  Musculoskeletal:  No joint deformity upper and lower extremities.  Neurological evaluation reveals no significant sensory deficits in all 4 extremities and facial region to light touch.  Motor strength symmetric in all 4 extremities.  No facial droop currently noted or pronator drift. Skin: no rashes, lesions, ulcers.  Psychiatric: Normal judgment and insight. Alert and oriented x  3. Normal mood.   Labs on Admission: I have personally reviewed following labs and imaging studies  CBC: Recent Labs  Lab 04/26/17 1452  WBC 5.6  NEUTROABS 3.4  HGB 13.6  HCT 41.6  MCV 82.2  PLT 937   Basic Metabolic Panel: Recent Labs  Lab 04/26/17 1452  NA 141  K 3.9  CL 109  CO2 21*  GLUCOSE 141*  BUN 30*  CREATININE 0.97  CALCIUM 10.0   GFR: Estimated Creatinine Clearance: 65.8 mL/min (by C-G formula based on SCr of 0.97 mg/dL). Liver Function Tests: Recent Labs  Lab 04/26/17 1452  AST 20  ALT 27  ALKPHOS 41  BILITOT 0.6  PROT 7.6  ALBUMIN 4.5   No results for input(s): LIPASE, AMYLASE in the last 168 hours. No results for input(s): AMMONIA in the last 168 hours. Coagulation Profile: Recent Labs  Lab 04/26/17 1452  INR 1.06   Cardiac Enzymes: No results for  input(s): CKTOTAL, CKMB, CKMBINDEX, TROPONINI in the last 168 hours. BNP (last 3 results) No results for input(s): PROBNP in the last 8760 hours. HbA1C: No results for input(s): HGBA1C in the last 72 hours. CBG: No results for input(s): GLUCAP in the last 168 hours. Lipid Profile: No results for input(s): CHOL, HDL, LDLCALC, TRIG, CHOLHDL, LDLDIRECT in the last 72 hours. Thyroid Function Tests: No results for input(s): TSH, T4TOTAL, FREET4, T3FREE, THYROIDAB in the last 72 hours. Anemia Panel: No results for input(s): VITAMINB12, FOLATE, FERRITIN, TIBC, IRON, RETICCTPCT in the last 72 hours. Urine analysis:    Component Value Date/Time   COLORURINE YELLOW 09/13/2015 0906   APPEARANCEUR CLEAR 09/13/2015 0906   LABSPEC 1.010 09/13/2015 0906   PHURINE 6.5 09/13/2015 0906   GLUCOSEU NEGATIVE 09/13/2015 0906   HGBUR NEGATIVE 09/13/2015 0906   BILIRUBINUR NEGATIVE 09/13/2015 0906   KETONESUR NEGATIVE 09/13/2015 0906   PROTEINUR NEGATIVE 09/13/2015 0906   UROBILINOGEN 0.2 04/15/2010 1425   NITRITE NEGATIVE 09/13/2015 0906   LEUKOCYTESUR NEGATIVE 09/13/2015 0906    Radiological Exams on  Admission: Ct Head Code Stroke Wo Contrast  Result Date: 04/26/2017 CLINICAL DATA:  Code stroke. Numbness left third and fourth fingers. EXAM: CT HEAD WITHOUT CONTRAST TECHNIQUE: Contiguous axial images were obtained from the base of the skull through the vertex without intravenous contrast. COMPARISON:  CT head 12/03/2016 FINDINGS: Brain: X generalized atrophy. Extensive chronic ischemic changes throughout the white matter. Chronic lacunar infarction in the left basal ganglia unchanged. Negative for acute infarct.  Negative for hemorrhage or mass. Vascular: Negative for hyperdense vessel Skull: Negative Sinuses/Orbits: Negative Other: None ASPECTS ([place] Stroke Program Early CT Score) - Ganglionic level infarction (caudate, lentiform nuclei, internal capsule, insula, M1-M3 cortex): 7 - Supraganglionic infarction (M4-M6 cortex): 3 Total score (0-10 with 10 being normal): 10 IMPRESSION: 1. No acute intracranial abnormality. Extensive chronic ischemic changes similar to the prior study 2. ASPECTS is 10 3. These results were called by telephone at the time of interpretation on 04/26/2017 at 3:06 pm to Dr. Nanda Quinton , who verbally acknowledged these results. Electronically Signed   By: Franchot Gallo M.D.   On: 04/26/2017 15:06    Assessment/Plan Principal Problem:   TIA (transient ischemic attack) Active Problems:   Type 2 diabetes mellitus ([facility])   Hyperlipidemia   Essential hypertension   CVA (cerebral vascular accident) ([place])    1. TIA in the setting of prior cerebrovascular events.  Noted prior lacunar stroke with intracranial atherosclerosis and associated syncope.  Continue current home regimen of aspirin and Plavix.  Patient has chewed full dose aspirin prior to arrival.  Tele-neurology recommendations for brain MRI without recommendation for echocardiogram or carotid ultrasound at this time.  PT/OT/SLP evaluation.  Fall precautions. 2. Hypertension.  Keep goal systolic blood pressure 102-725  based on prior near-syncope events otherwise.  Will start home blood pressure medications for now and continue to monitor after stroke swallow screen. 3. Type 2 diabetes.  Maintain on sliding scale insulin and avoid metformin in case contrast studies are required.  Check hemoglobin A1c. 4. Dyslipidemia.  Continue home statin management and check lipid panel.  Patient is apparently intolerant of high-dose statin use.   DVT prophylaxis: Lovenox Code Status: Full Family Communication: Wife at bedside Disposition Plan:TIA/CVA evaluation with Brain MRI and PT/OT/SLP Consults called:None Admission status: Obs, tele   Zoelle Markus Darleen Crocker DO [facility] Pager [phone]  If 7PM-7AM, please contact night-coverage [email or web address] Password [facility]  04/26/2017, 3:46 PM

## 2017-04-26 NOTE — Consult Note (Addendum)
   [facility] Tele[facility] Consult Services  Impression: Hebert Soho. Right hemispheric    Not a tpa candidate due to: resolved Does not meet LVO screening criteria (no aphasia, neglect, gaze deviation, dense hemiparesis, visual field deficits on exam); therefore, advanced imaging not recommended.   Differential Diagnosis:   1. Cardioembolic stroke  2. Small vessel disease/lacune  3. Thromboembolic, artery-to-artery mechanism  4. Hypercoagulable state-related infarct  5. Transient ischemic attack  6. Thrombotic mechanism, large artery disease   Comments:   [facility] contacted: [phone] [facility] at bedside:  [phone]  Recommendations:  Activate Stroke protocol admission/order set  Stroke/telemetry floor Neuro checks Bedside swallow eval Dvt prophylaxis IV fluids, normal saline ASA if no contraindications Head of bed below 30 degrees Euglycemia and avoid hyperthermia (prn acetaminophen)  inpatient neurology consultation Inpatient stroke evaluation as per Neurology/ Internal Medicine Discussed with ED MD  -----------------------------------------------------------------------------------------  CC left hand weakness  History of Present Illness   Patient is a 82 y.o. year old man who comes to the hospital for left hand numbness. He describes that at around 1400 he noticed his left hand was off and could not control it well. He has had previous tias. He takes asa and Plavix. His symptoms have resolved and he feels back at baseline.  Diagnostic: Ct head without contrast: no acute findings per rad read  Exam:  NIHSS score: 0  1A: Level of Consciousness - Alert; keenly responsive 0 1B: Ask Month and Age - Both Questions Right 0 1C: 'Blink Eyes' & 'Squeeze Hands' - Performs Both Tasks 0 2: Test Horizontal Extraocular Movements - Normal 0 3: Test Visual Fields - No Visual Loss 0 4: Test Facial Palsy - Normal symmetry 0 5A: Test Left Arm Motor Drift - No Drift for 10  Seconds 0 5B: Test Right Arm Motor Drift - No Drift for 10 Seconds 0 6A: Test Left Leg Motor Drift - No Drift for 5 Seconds 0 6B: Test Right Leg Motor Drift - No Drift for 5 Seconds 0 7: Test Limb Ataxia - No Ataxia 0 8: Test Sensation - Normal; No sensory loss 0 9: Test Language/Aphasia - Normal; No aphasia 0 10: Test Dysarthria - Normal 0 11: Test Extinction/Inattention - No abnormality 0       Medical Decision Making:  - Extensive number of diagnosis or management options are considered above.   - Extensive amount of complex data reviewed.   - High risk of complication and/or morbidity or mortality are associated with differential diagnostic considerations above.  - There may be Uncertain outcome and increased probability of prolonged functional impairment or high probability of severe prolonged functional impairment associated with some of these differential diagnosis.  Medical Data Reviewed:  1.Data reviewed include clinical labs, radiology,  Medical Tests;   2.Tests results discussed w/performing or interpreting physician;   3.Obtaining/reviewing old medical records;  4.Obtaining case history from another source;  5.Independent review of image, tracing or specimen.    Patient was informed the Neurology Consult would happen via telehealth (remote video) and consented to receiving care in this manner.

## 2017-04-26 NOTE — Progress Notes (Signed)
CODE STROKE 1449 BEEPER 1457 EXAM STARTED 1458 EXAM FINISHED 1458 IMAGES SENT TO SOC 1501 EXAM COMPLETE IN EPIC 1501 [facility] CALLED

## 2017-04-27 ENCOUNTER — Encounter (HOSPITAL_COMMUNITY): Payer: Self-pay | Admitting: *Deleted

## 2017-04-27 DIAGNOSIS — G459 Transient cerebral ischemic attack, unspecified: Secondary | ICD-10-CM | POA: Diagnosis not present

## 2017-04-27 LAB — CBC
HEMATOCRIT: 40.9 % (ref 39.0–52.0)
HEMOGLOBIN: 13.3 g/dL (ref 13.0–17.0)
MCH: 27 pg (ref 26.0–34.0)
MCHC: 32.5 g/dL (ref 30.0–36.0)
MCV: 83 fL (ref 78.0–100.0)
Platelets: 241 10*3/uL (ref 150–400)
RBC: 4.93 MIL/uL (ref 4.22–5.81)
RDW: 14.3 % (ref 11.5–15.5)
WBC: 4.8 10*3/uL (ref 4.0–10.5)

## 2017-04-27 LAB — RAPID URINE DRUG SCREEN, HOSP PERFORMED
Amphetamines: NOT DETECTED
Barbiturates: NOT DETECTED
Benzodiazepines: NOT DETECTED
Cocaine: NOT DETECTED
OPIATES: NOT DETECTED
Tetrahydrocannabinol: NOT DETECTED

## 2017-04-27 LAB — BASIC METABOLIC PANEL
Anion gap: 10 (ref 5–15)
BUN: 27 mg/dL — ABNORMAL HIGH (ref 6–20)
CHLORIDE: 103 mmol/L (ref 101–111)
CO2: 25 mmol/L (ref 22–32)
Calcium: 9.1 mg/dL (ref 8.9–10.3)
Creatinine, Ser: 0.92 mg/dL (ref 0.61–1.24)
GFR calc non Af Amer: >60 mL/min (ref >60)
Glucose, Bld: 145 mg/dL — ABNORMAL HIGH (ref 65–99)
POTASSIUM: 3.6 mmol/L (ref 3.5–5.1)
SODIUM: 138 mmol/L (ref 135–145)

## 2017-04-27 LAB — LIPID PANEL
Cholesterol: 131 mg/dL (ref 0–200)
HDL: 23 mg/dL — AB (ref >40)
LDL Cholesterol: 60 mg/dL (ref 0–99)
Total CHOL/HDL Ratio: 5.7 RATIO
Triglycerides: 241 mg/dL — ABNORMAL HIGH (ref <150)
VLDL: 48 mg/dL — ABNORMAL HIGH (ref 0–40)

## 2017-04-27 LAB — URINALYSIS, ROUTINE W REFLEX MICROSCOPIC
BILIRUBIN URINE: NEGATIVE
Glucose, UA: NEGATIVE mg/dL
Hgb urine dipstick: NEGATIVE
KETONES UR: NEGATIVE mg/dL
LEUKOCYTES UA: NEGATIVE
NITRITE: NEGATIVE
PH: 6 (ref 5.0–8.0)
Protein, ur: NEGATIVE mg/dL
SPECIFIC GRAVITY, URINE: 1.018 (ref 1.005–1.030)

## 2017-04-27 LAB — HEMOGLOBIN A1C
HEMOGLOBIN A1C: 6.4 % — AB (ref 4.8–5.6)
MEAN PLASMA GLUCOSE: 136.98 mg/dL

## 2017-04-27 LAB — GLUCOSE, CAPILLARY
GLUCOSE-CAPILLARY: 168 mg/dL — AB (ref 65–99)
Glucose-Capillary: 129 mg/dL — ABNORMAL HIGH (ref 65–99)

## 2017-04-27 NOTE — Progress Notes (Signed)
OT Cancellation Note  Patient Details Name: [name] MRN: [id number] DOB: 28-Jun-1935   Cancelled Treatment:    Reason Eval/Treat Not Completed: OT screened, no needs identified, will sign off. Pt is at baseline independence in ADL completion. No further OT needs.    Guadelupe Sabin, OTR/L  [phone] 04/27/2017, 8:10 AM

## 2017-04-27 NOTE — Discharge Instructions (Signed)
Transient Ischemic Attack °A transient ischemic attack (TIA) is a "warning stroke" that causes stroke-like symptoms. A TIA does not cause lasting damage to the brain. The symptoms of a TIA can happen fast and do not last long. It is important to know the symptoms of a TIA and what to do. This can help prevent stroke or death. °Follow these instructions at home: °· Take medicines only as told by your doctor. Make sure you understand all of the instructions. °· You may need to take aspirin or warfarin medicine. Warfarin needs to be taken exactly as told. °? Taking too much or too little warfarin is dangerous. Blood tests must be done as often as told by your doctor. A PT blood test measures how long it takes for blood to clot. Your PT is used to calculate another value called an INR. Your PT and INR help your doctor adjust your warfarin dosage. He or she will make sure you are taking the right amount. °? Food can cause problems with warfarin and affect the results of your blood tests. This is true for foods high in vitamin K. Eat the same amount of foods high in vitamin K each day. Foods high in vitamin K include spinach, kale, broccoli, cabbage, collard and turnip greens, Brussels sprouts, peas, cauliflower, seaweed, and parsley. Other foods high in vitamin K include beef and pork liver, green tea, and soybean oil. Eat the same amount of foods high in vitamin K each day. Avoid big changes in your diet. Tell your doctor before changing your diet. Talk to a food specialist (dietitian) if you have questions. °? Many medicines can cause problems with warfarin and affect your PT and INR. Tell your doctor about all medicines you take. This includes vitamins and dietary pills (supplements). Do not take or stop taking any prescribed or over-the-counter medicines unless your doctor tells you to. °? Warfarin can cause more bruising or bleeding. Hold pressure over any cuts for longer than normal. Talk to your doctor about other  side effects of warfarin. °? Avoid sports or activities that may cause injury or bleeding. °? Be careful when you shave, floss, or use sharp objects. °? Avoid or drink very little alcohol while taking warfarin. Tell your doctor if you change how much alcohol you drink. °? Tell your dentist and other doctors that you take warfarin before any procedures. °· Follow your diet program as told, if you are given one. °· Keep a healthy weight. °· Stay active. Try to get at least 30 minutes of activity on all or most days. °· Do not use any tobacco products, including cigarettes, chewing tobacco, or electronic cigarettes. If you need help quitting, ask your doctor. °· Limit alcohol intake to no more than 1 drink per day for nonpregnant women and 2 drinks per day for men. One drink equals 12 ounces of beer, 5 ounces of wine, or 1½ ounces of hard liquor. °· Do not abuse drugs. °· Keep your home safe so you do not fall. You can do this by: °? Putting grab bars in the bedroom and bathroom. °? Raising toilet seats. °? Putting a seat in the shower. °· Keep all follow-up visits as told by your doctor. This is important. °Contact a doctor if: °· Your personality changes. °· You have trouble swallowing. °· You have double vision. °· You are dizzy. °· You have a fever. °Get help right away if: °These symptoms may be an emergency. Do not wait to see if   the symptoms will go away. Get medical help right away. Call your local emergency services (911 in the [place] Do not drive yourself to the hospital. °· You have sudden weakness or lose feeling (go numb), especially on one side of the body. This can affect your: °? Face. °? Arm. °? Leg. °· You have sudden trouble walking. °· You have sudden trouble moving your arms or legs. °· You have sudden confusion. °· You have trouble talking. °· You have trouble understanding. °· You have sudden trouble seeing in one or both eyes. °· You lose your balance. °· Your movements are not smooth. °· You  have a sudden, very bad headache with no known cause. °· You have new chest pain. °· Your heartbeat is unsteady. °· You are partly or totally unaware of what is going on around you. ° °This information is not intended to replace advice given to you by your health care provider. Make sure you discuss any questions you have with your health care provider. °Document Released: 10/08/2007 Document Revised: 09/02/2015 Document Reviewed: 04/05/2013 °Elsevier Interactive Patient Education © 2018 [employer] ° °

## 2017-04-27 NOTE — Evaluation (Signed)
Physical Therapy Evaluation Patient Details Name: Steve Ramirez MRN: [id number] DOB: [birth year] Today's Date: 04/27/2017   History of Present Illness  Steve Ramirez is a 82 y.o. male with medical history significant for hypertension, dyslipidemia, type 2 diabetes, CAD, prior TIAs and CVAs on aspirin and Plavix who presented to the emergency department with sudden onset left hand numbness as well as speech slurring and facial droop at approximately 1400 today.  He states that he was mowing his lawn and had just come inside to watch some TV when the symptoms occurred.  He states that he has been compliant with his home antiplatelet regimen and other medications.  He presented to the emergency department with an approximately 40 minutes with resolution of his symptoms.    Clinical Impression  Patient functioning at baseline for functional mobility and gait.  Plan: patient discharged from physical therapy to care of nursing for ambulation ad lib for length of stay.    Follow Up Recommendations No PT follow up    Equipment Recommendations  None recommended by PT    Recommendations for Other Services       Precautions / Restrictions Precautions Precautions: None Restrictions Weight Bearing Restrictions: No      Mobility  Bed Mobility Overal bed mobility: Independent                Transfers Overall transfer level: Independent                  Ambulation/Gait Ambulation/Gait assistance: Modified independent (Device/Increase time) Ambulation Distance (Feet): 150 Feet   Gait Pattern/deviations: WFL(Within Functional Limits) Gait velocity: decreased   General Gait Details: grossly WFL except slower than normal  Stairs            Wheelchair Mobility    Modified Rankin (Stroke Patients Only)       Balance Overall balance assessment: No apparent balance deficits (not formally assessed)                                            Pertinent Vitals/Pain Pain Assessment: No/denies pain    Home Living Family/patient expects to be discharged to:: Private residence Living Arrangements: Spouse/significant other Available Help at Discharge: Family;Available 24 hours/day Type of Home: House Home Access: Stairs to enter Entrance Stairs-Rails: Left;Right;Can reach both Entrance Stairs-Number of Steps: 2 steps into the back, 4 steps into garage Home Layout: One level Home Equipment: [place] - 2 wheels;Cane - single point;Shower seat - built in;Hand held shower head;Wheelchair - manual;Shower seat;Bedside commode      Prior Function Level of Independence: Independent         Comments: community ambulator, drives     Hand Dominance   Dominant Hand: Right    Extremity/Trunk Assessment   Upper Extremity Assessment Upper Extremity Assessment: Overall WFL for tasks assessed    Lower Extremity Assessment Lower Extremity Assessment: Overall WFL for tasks assessed    Cervical / Trunk Assessment Cervical / Trunk Assessment: Normal  Communication   Communication: No difficulties  Cognition Arousal/Alertness: Awake/alert Behavior During Therapy: WFL for tasks assessed/performed Overall Cognitive Status: Within Functional Limits for tasks assessed                                        General Comments  Exercises     Assessment/Plan    PT Assessment Patent does not need any further PT services  PT Problem List         PT Treatment Interventions      PT Goals (Current goals can be found in the Care Plan section)  Acute Rehab PT Goals Patient Stated Goal: return home today PT Goal Formulation: With patient/family Time For Goal Achievement: 05-13-2017 Potential to Achieve Goals: Good    Frequency     Barriers to discharge        Co-evaluation               AM-PAC PT "6 Clicks" Daily Activity  Outcome Measure Difficulty turning over in bed (including adjusting  bedclothes, sheets and blankets)?: None Difficulty moving from lying on back to sitting on the side of the bed? : None Difficulty sitting down on and standing up from a chair with arms (e.g., wheelchair, bedside commode, etc,.)?: None Help needed moving to and from a bed to chair (including a wheelchair)?: None Help needed walking in hospital room?: None Help needed climbing 3-5 steps with a railing? : None 6 Click Score: 24    End of Session   Activity Tolerance: Patient tolerated treatment well Patient left: in bed(seated at bedside) Nurse Communication: Mobility status PT Visit Diagnosis: Unsteadiness on feet (R26.81);Other abnormalities of gait and mobility (R26.89);Muscle weakness (generalized) (M62.81)    Time: 9024-0973 PT Time Calculation (min) (ACUTE ONLY): 22 min   Charges:   PT Evaluation $PT Eval Low Complexity: 1 Low PT Treatments $Gait Training: 8-22 mins   PT G Codes:        12:21 PM, May 13, 2017 Lonell Grandchild, MPT Physical Therapist with [facility] [phone] office [phone] mobile phone

## 2017-04-27 NOTE — Progress Notes (Signed)
Patient discharged home.  IV removed - WNL.  Reviewed AVS and medications.  Instructed to follow up with PCP and neuro.  No questions at this time, patient in NAD.

## 2017-04-27 NOTE — Discharge Summary (Signed)
Physician Discharge Summary  ABDULAHI SCHOR Ramirez:[id number] DOB: 16-Dec-1935 DOA: 04/26/2017  PCP: Caren Macadam, MD  Admit date: 04/26/2017  Discharge date: 04/27/2017  Admitted From:Home  Disposition:  Home  Recommendations for Outpatient Follow-up:  1. Follow up with PCP in 1-2 weeks 2. Follow up with Neurologist Dr. Leta Baptist in 2 weeks  Home Health:None  Equipment/Devices:None  Discharge Condition:Stable  CODE STATUS: Full  Diet recommendation: Heart Healthy  Brief/Interim Summary:  Steve Ramirez is a 82 y.o. male with medical history significant for hypertension, dyslipidemia, type 2 diabetes, CAD, prior TIAs and CVAs on aspirin and Plavix who presented to the emergency department with sudden onset left hand numbness as well as speech slurring and facial droop at approximately 1400 on the day of admission.  He states that he was mowing his lawn and had just come inside to watch some TV when the symptoms occurred.  He was seen by tele-neurology with recommendations to evaluate for CVA with brain MRI which was performed demonstrating no acute CVA.  Patient's symptoms have quickly resolved through this admission and he is noted to have no residual deficits.  He has been seen by PT/OT with no follow-up recommendations as he is back to his usual baseline.  He also has no trouble with his speech and is able to swallow and eat as usual.  He has been recommended to remain on his usual home aspirin and Plavix regimen with follow-up to his neurologist Dr. Leta Baptist in 2 weeks.  No further recommendations at this time from tele-neurology.    Discharge Diagnoses:  Principal Problem:   TIA (transient ischemic attack) Active Problems:   Type 2 diabetes mellitus (HCC)   Hyperlipidemia   Essential hypertension   CVA (cerebral vascular accident) ([place])  1. TIA in the setting of prior cerebrovascular events.  Noted prior lacunar stroke with intracranial atherosclerosis and  associated syncope.  Continue current home regimen of aspirin and Plavix.  Patient has chewed full dose aspirin prior to arrival.    Brain MRI with no acute CVA findings noted.  Patient has been evaluated via stroke swallow screen, PT, and OT with no further recommendations at this time as there are no deficits and patient is back to baseline.  Will follow with neurologist Dr. Leta Baptist in 2 weeks. 2. Hypertension.    Continue home medications as blood pressure control is currently adequate. 3. Type 2 diabetes.    Continue on metformin. 4. Dyslipidemia.  Continue home statin management and lipid panel with noted LDL of 60.   Discharge Instructions  Discharge Instructions    Diet - low sodium heart healthy   Complete by:  As directed    Increase activity slowly   Complete by:  As directed      Allergies as of 04/27/2017   No Known Allergies     Medication List    TAKE these medications   amLODipine 10 MG tablet Commonly known as:  NORVASC Take 1 tablet (10 mg total) by mouth daily.   aspirin 81 MG EC tablet Take 1 tablet (81 mg total) by mouth daily.   cetirizine 10 MG tablet Commonly known as:  ZYRTEC Take 10 mg by mouth daily.   clopidogrel 75 MG tablet Commonly known as:  PLAVIX Take 1 tablet (75 mg total) by mouth daily.   escitalopram 10 MG tablet Commonly known as:  LEXAPRO 10 mg daily.   fenofibrate 145 MG tablet Commonly known as:  TRICOR Take 145 mg by mouth daily.  isosorbide mononitrate 30 MG 24 hr tablet Commonly known as:  IMDUR Take 1 tablet by mouth daily.   levothyroxine 50 MCG tablet Commonly known as:  SYNTHROID, LEVOTHROID Take 1 tablet by mouth daily.   lisinopril 40 MG tablet Commonly known as:  PRINIVIL,ZESTRIL Take 1 tablet (40 mg total) by mouth daily.   metFORMIN 500 MG tablet Commonly known as:  GLUCOPHAGE Take 500 mg by mouth 2 (two) times daily.   metoprolol succinate 50 MG 24 hr tablet Commonly known as:  TOPROL-XL Take 50 mg  by mouth daily. Take with or immediately following a meal.   multivitamin tablet Take 1 tablet by mouth daily.   nitroGLYCERIN 0.4 MG SL tablet Commonly known as:  NITROSTAT Place 0.4 mg under the tongue every 5 (five) minutes as needed for chest pain.   OMEGA 3 PO Take 1,000 mg by mouth daily.   pravastatin 10 MG tablet Commonly known as:  PRAVACHOL Take 1 tablet by mouth daily.   rOPINIRole 0.5 MG tablet Commonly known as:  REQUIP Take 2 tablets by mouth at bedtime.   Saw Palmetto 450 MG Caps Take 1 capsule by mouth daily.   spironolactone 25 MG tablet Commonly known as:  ALDACTONE Take 1 tablet by mouth daily.      Follow-up Information    Caren Macadam, MD Follow up in 1 week(s).   Specialty:  Family Medicine Contact information: [address] [place] [address] [phone]        Penni Bombard, MD Follow up in 2 week(s).   Specialties:  Neurology, Radiology Contact information: [address] [place] [address] [phone]          No Known Allergies  Consultations:  Teleneurology   Procedures/Studies: Mr Brain Wo Contrast  Result Date: 04/26/2017 CLINICAL DATA:  Slurred speech, LEFT hand numbness, began earlier today. History of hypertension, diabetes, and prior stroke EXAM: MRI HEAD WITHOUT CONTRAST TECHNIQUE: Multiplanar, multiecho pulse sequences of the brain and surrounding structures were obtained without intravenous contrast. COMPARISON:  CT head performed at 1458 hours.  MR head 02/02/2016. FINDINGS: The patient was unable to remain motionless for the exam. Small or subtle lesions could be overlooked. Brain: No evidence for acute infarction, hemorrhage, mass lesion, hydrocephalus, or extra-axial fluid. Generalized atrophy. Extensive focal and confluent T2 and FLAIR hyperintensities throughout the white matter, consistent with small vessel disease. Multiple areas of chronic lacunar infarction, most notable in  the LEFT thalamus and LEFT basal ganglia, but also involving the brainstem and cerebellum, stable. Vascular: Flow voids are maintained in the carotid, basilar, and vertebral arteries. Skull and upper cervical spine: Normal marrow signal. Partial empty sella, doubtful significance. Sinuses/Orbits: Negative. Other: None. IMPRESSION: Atrophy and small vessel disease.  Old lacunar infarcts. No acute intracranial findings. Electronically Signed   By: Staci Righter M.D.   On: 04/26/2017 16:49   Ct Head Code Stroke Wo Contrast  Result Date: 04/26/2017 CLINICAL DATA:  Code stroke. Numbness left third and fourth fingers. EXAM: CT HEAD WITHOUT CONTRAST TECHNIQUE: Contiguous axial images were obtained from the base of the skull through the vertex without intravenous contrast. COMPARISON:  CT head 12/03/2016 FINDINGS: Brain: X generalized atrophy. Extensive chronic ischemic changes throughout the white matter. Chronic lacunar infarction in the left basal ganglia unchanged. Negative for acute infarct.  Negative for hemorrhage or mass. Vascular: Negative for hyperdense vessel Skull: Negative Sinuses/Orbits: Negative Other: None ASPECTS ([place] Stroke Program Early CT Score) - Ganglionic level  infarction (caudate, lentiform nuclei, internal capsule, insula, M1-M3 cortex): 7 - Supraganglionic infarction (M4-M6 cortex): 3 Total score (0-10 with 10 being normal): 10 IMPRESSION: 1. No acute intracranial abnormality. Extensive chronic ischemic changes similar to the prior study 2. ASPECTS is 10 3. These results were called by telephone at the time of interpretation on 04/26/2017 at 3:06 pm to Dr. Nanda Quinton , who verbally acknowledged these results. Electronically Signed   By: Franchot Gallo M.D.   On: 04/26/2017 15:06   Discharge Exam: Vitals:   04/27/17 0835 04/27/17 0916  BP: (!) 157/91 (!) 141/71  Pulse: 81 68  Resp: 18 18  Temp: (!) 97.5 F (36.4 C) (!) 97.5 F (36.4 C)  SpO2: 98% 95%   Vitals:   04/27/17 0235  04/27/17 0435 04/27/17 0835 04/27/17 0916  BP: 100/86 (!) 152/73 (!) 157/91 (!) 141/71  Pulse: 83 (!) 58 81 68  Resp: 18 17 18 18   Temp: 98.5 F (36.9 C) (!) 97.5 F (36.4 C) (!) 97.5 F (36.4 C) (!) 97.5 F (36.4 C)  TempSrc: Oral Oral Axillary   SpO2: 99% 98% 98% 95%  Weight:      Height:        General: Pt is alert, awake, not in acute distress Cardiovascular: RRR, S1/S2 +, no rubs, no gallops Respiratory: CTA bilaterally, no wheezing, no rhonchi Abdominal: Soft, NT, ND, bowel sounds + Extremities: no edema, no cyanosis; no significant neurological deficits in all 4 extremities over the facial region.  Sensation to light touch intact throughout with symmetric motor strength in extremities.    The results of significant diagnostics from this hospitalization (including imaging, microbiology, ancillary and laboratory) are listed below for reference.     Microbiology: No results found for this or any previous visit (from the past 240 hour(s)).   Labs: BNP (last 3 results) No results for input(s): BNP in the last 8760 hours. Basic Metabolic Panel: Recent Labs  Lab 04/26/17 1452 04/27/17 0540  NA 141 138  K 3.9 3.6  CL 109 103  CO2 21* 25  GLUCOSE 141* 145*  BUN 30* 27*  CREATININE 0.97 0.92  CALCIUM 10.0 9.1   Liver Function Tests: Recent Labs  Lab 04/26/17 1452  AST 20  ALT 27  ALKPHOS 41  BILITOT 0.6  PROT 7.6  ALBUMIN 4.5   No results for input(s): LIPASE, AMYLASE in the last 168 hours. No results for input(s): AMMONIA in the last 168 hours. CBC: Recent Labs  Lab 04/26/17 1452 04/27/17 0540  WBC 5.6 4.8  NEUTROABS 3.4  --   HGB 13.6 13.3  HCT 41.6 40.9  MCV 82.2 83.0  PLT 254 241   Cardiac Enzymes: No results for input(s): CKTOTAL, CKMB, CKMBINDEX, TROPONINI in the last 168 hours. BNP: Invalid input(s): POCBNP CBG: Recent Labs  Lab 04/26/17 1747 04/26/17 2045 04/27/17 0754  GLUCAP 101* 179* 129*   D-Dimer No results for input(s):  DDIMER in the last 72 hours. Hgb A1c No results for input(s): HGBA1C in the last 72 hours. Lipid Profile Recent Labs    04/27/17 0540  CHOL 131  HDL 23*  LDLCALC 60  TRIG 241*  CHOLHDL 5.7   Thyroid function studies No results for input(s): TSH, T4TOTAL, T3FREE, THYROIDAB in the last 72 hours.  Invalid input(s): FREET3 Anemia work up No results for input(s): VITAMINB12, FOLATE, FERRITIN, TIBC, IRON, RETICCTPCT in the last 72 hours. Urinalysis    Component Value Date/Time   COLORURINE YELLOW 04/27/2017 0100  APPEARANCEUR CLEAR 04/27/2017 0100   LABSPEC 1.018 04/27/2017 0100   PHURINE 6.0 04/27/2017 0100   GLUCOSEU NEGATIVE 04/27/2017 0100   HGBUR NEGATIVE 04/27/2017 0100   BILIRUBINUR NEGATIVE 04/27/2017 0100   KETONESUR NEGATIVE 04/27/2017 0100   PROTEINUR NEGATIVE 04/27/2017 0100   UROBILINOGEN 0.2 04/15/2010 1425   NITRITE NEGATIVE 04/27/2017 0100   LEUKOCYTESUR NEGATIVE 04/27/2017 0100   Sepsis Labs Invalid input(s): PROCALCITONIN,  WBC,  LACTICIDVEN Microbiology No results found for this or any previous visit (from the past 240 hour(s)).   Time coordinating discharge: 35 minutes  SIGNED:   Rodena Goldmann, DO [facility] 04/27/2017, 10:48 AM Pager [phone]  If 7PM-7AM, please contact night-coverage [email or web address] Password [email or web address]

## 2017-05-07 DIAGNOSIS — E119 Type 2 diabetes mellitus without complications: Secondary | ICD-10-CM | POA: Diagnosis not present

## 2017-05-07 DIAGNOSIS — E1140 Type 2 diabetes mellitus with diabetic neuropathy, unspecified: Secondary | ICD-10-CM | POA: Diagnosis not present

## 2017-05-07 DIAGNOSIS — E039 Hypothyroidism, unspecified: Secondary | ICD-10-CM | POA: Diagnosis not present

## 2017-05-07 DIAGNOSIS — R252 Cramp and spasm: Secondary | ICD-10-CM | POA: Diagnosis not present

## 2017-05-07 DIAGNOSIS — Z79899 Other long term (current) drug therapy: Secondary | ICD-10-CM | POA: Diagnosis not present

## 2017-05-07 DIAGNOSIS — G629 Polyneuropathy, unspecified: Secondary | ICD-10-CM | POA: Diagnosis not present

## 2017-05-07 DIAGNOSIS — E782 Mixed hyperlipidemia: Secondary | ICD-10-CM | POA: Diagnosis not present

## 2017-05-07 LAB — URINALYSIS, ROUTINE W REFLEX MICROSCOPIC
BILIRUBIN URINE: NEGATIVE
Glucose, UA: NEGATIVE
Hgb urine dipstick: NEGATIVE
KETONES UR: NEGATIVE
Leukocytes, UA: NEGATIVE
Nitrite: NEGATIVE
PH: 7.5 (ref 5.0–8.0)
Protein, ur: NEGATIVE
SPECIFIC GRAVITY, URINE: 1.024 (ref 1.001–1.03)

## 2017-05-07 LAB — COMPLETE METABOLIC PANEL WITH GFR
AG Ratio: 1.9 (calc) (ref 1.0–2.5)
ALKALINE PHOSPHATASE (APISO): 40 U/L (ref 40–115)
ALT: 19 U/L (ref 9–46)
AST: 14 U/L (ref 10–35)
Albumin: 4.6 g/dL (ref 3.6–5.1)
BUN: 24 mg/dL (ref 7–25)
CALCIUM: 9.8 mg/dL (ref 8.6–10.3)
CO2: 27 mmol/L (ref 20–32)
CREATININE: 1.03 mg/dL (ref 0.70–1.18)
Chloride: 107 mmol/L (ref 98–110)
GFR, EST NON AFRICAN AMERICAN: 69 mL/min/{1.73_m2} (ref >=60)
GFR, Est African American: 80 mL/min/{1.73_m2} (ref >=60)
Globulin: 2.4 g/dL (calc) (ref 1.9–3.7)
Glucose, Bld: 144 mg/dL — ABNORMAL HIGH (ref 65–139)
POTASSIUM: 4.7 mmol/L (ref 3.5–5.3)
Sodium: 142 mmol/L (ref 135–146)
Total Bilirubin: 0.6 mg/dL (ref 0.2–1.2)
Total Protein: 7 g/dL (ref 6.1–8.1)

## 2017-05-07 LAB — LIPID PANEL
CHOLESTEROL: 136 mg/dL (ref <200)
HDL: 29 mg/dL — ABNORMAL LOW (ref >40)
LDL Cholesterol (Calc): 82 mg/dL (calc)
Non-HDL Cholesterol (Calc): 107 mg/dL (calc) (ref <130)
Total CHOL/HDL Ratio: 4.7 (calc) (ref <5.0)
Triglycerides: 150 mg/dL — ABNORMAL HIGH (ref <150)

## 2017-05-07 LAB — TSH: TSH: 2.33 m[IU]/L (ref 0.40–4.50)

## 2017-05-07 LAB — MAGNESIUM: Magnesium: 2.2 mg/dL (ref 1.5–2.5)

## 2017-05-07 LAB — HEMOGLOBIN A1C
EAG (MMOL/L): 7.9 (calc)
HEMOGLOBIN A1C: 6.6 %{Hb} — AB (ref <5.7)
MEAN PLASMA GLUCOSE: 143 (calc)

## 2017-05-08 ENCOUNTER — Encounter: Payer: Self-pay | Admitting: Family Medicine

## 2017-05-08 LAB — VITAMIN D 25 HYDROXY (VIT D DEFICIENCY, FRACTURES): VIT D 25 HYDROXY: 33 ng/mL (ref 30–100)

## 2017-05-08 LAB — URINE CULTURE
MICRO NUMBER:: 90512458
SPECIMEN QUALITY: ADEQUATE

## 2017-05-08 LAB — MICROALBUMIN / CREATININE URINE RATIO
Creatinine, Urine: 110 mg/dL (ref 20–320)
Microalb Creat Ratio: 4 mcg/mg creat (ref <30)
Microalb, Ur: 0.4 mg/dL

## 2017-05-08 LAB — VITAMIN B12: VITAMIN B 12: 468 pg/mL (ref 200–1100)

## 2017-05-11 ENCOUNTER — Ambulatory Visit: Payer: Medicare HMO | Admitting: Family Medicine

## 2017-05-11 ENCOUNTER — Encounter: Payer: Self-pay | Admitting: Family Medicine

## 2017-05-11 ENCOUNTER — Other Ambulatory Visit: Payer: Self-pay

## 2017-05-11 VITALS — BP 180/78 | HR 67 | Temp 98.9°F | Resp 16 | Ht 68.0 in | Wt 191.2 lb

## 2017-05-11 DIAGNOSIS — I10 Essential (primary) hypertension: Secondary | ICD-10-CM

## 2017-05-11 DIAGNOSIS — E118 Type 2 diabetes mellitus with unspecified complications: Secondary | ICD-10-CM | POA: Diagnosis not present

## 2017-05-11 DIAGNOSIS — I25118 Atherosclerotic heart disease of native coronary artery with other forms of angina pectoris: Secondary | ICD-10-CM | POA: Diagnosis not present

## 2017-05-11 MED ORDER — CLONIDINE 0.2 MG/24HR TD PTWK: 0.2000 mg | MEDICATED_PATCH | TRANSDERMAL | 12 refills | Status: DC

## 2017-05-11 MED ORDER — TRIAMTERENE-HCTZ 37.5-25 MG PO TABS: 1.0000 | ORAL_TABLET | Freq: Every day | ORAL | 3 refills | Status: DC

## 2017-05-11 NOTE — Progress Notes (Signed)
Patient ID: Steve Ramirez, male    DOB: Mar 03, 1935, 82 y.o.   MRN: [id number]  Chief Complaint  Patient presents with  . Follow-up    Allergies Patient has no known allergies.  Subjective:   Steve Ramirez is a 82 y.o. male who presents to [facility] today.  HPI Steve Ramirez presents today for a follow-up office visit.  Since he was last seen in our office he did have a TIA and was evaluated at the emergency department.  The TIA manifested as weakness in his left hand.  He was seen and evaluated at the emergency department and had resolution of his symptoms.  When he was seen in the emergency department his blood pressure ranged from 017-510 systolic.    He has not followed up or contacted his neurologist.  He reports that he is tired of going through so many tests and he still continues to have these TIAs.  He reports that his blood pressure has not been controlled at home.  He did stop the HCTZ as directed from the last visit.  It did not make any difference with his urinary symptoms.  He did start the Norvasc 10 mg a day.  He reports compliance with taking the metoprolol.  He reports that he has increased the dose of lisinopril to 40 mg a day as directed at the last visit.  He reports that despite all these changes his blood pressure has remained elevated.  He denies any chest pain, shortness of breath, palpitations, or swelling in his extremities.  He is still active and walks and exercises daily.  He is on aspirin and Plavix daily.  He denies any bleeding or problems with these medications.  He denies any stomach problems.  He is taking his cholesterol medicine, pravastatin 10 mg p.o. nightly.  His LDL was approximately 80 at the last check.  He reports that he cannot go up on the medication because he cannot deal with the myalgias.  He is taking the Lexapro for his mood and reports that his mood is good but he does get frustrated that he still continues to  have these TIAs.  He is very thankful that he did not have a stroke with this last episode.  He does have an upcoming appointment to see the urologist on 05/26/2017.  He reports that he has had trouble with his incontinence ever since his first stroke.  He has not received the condom catheters today and does not want an order placed until after he sees urology.     Past Medical History:  Diagnosis Date  . Coronary atherosclerosis of native coronary artery   . Essential hypertension, benign   . GIB (gastrointestinal bleeding)    Colonic diverticular bleed  . History of melanoma   . History of TIAs   . Stroke ([place])   . Type 2 diabetes mellitus ([place])     Past Surgical History:  Procedure Laterality Date  . COLONOSCOPY  08/13/2010   Procedure: COLONOSCOPY;  Surgeon: Rogene Houston, MD;  Location: [facility];  Service: Endoscopy;  Laterality: N/A;  . KNEE SURGERY     arthroscopic  . LEFT HEART CATHETERIZATION WITH CORONARY ANGIOGRAM N/A 11/24/2010   Procedure: LEFT HEART CATHETERIZATION WITH CORONARY ANGIOGRAM;  Surgeon: Josue Hector, MD;  Location: [facility] CATH LAB;  Service: Cardiovascular;  Laterality: N/A;  . neck fusion     years ago    Family History  Problem Relation Age  of Onset  . Coronary artery disease Father   . Asthma Unknown   . Emphysema Mother   . Coronary artery disease Paternal Uncle   . Brain cancer Brother      Social History   Socioeconomic History  . Marital status: Married    Spouse name: Not on file  . Number of children: Not on file  . Years of education: Not on file  . Highest education level: Not on file  Occupational History  . Occupation: retired  Scientific laboratory technician  . Financial resource strain: Not on file  . Food insecurity:    Worry: Not on file    Inability: Not on file  . Transportation needs:    Medical: Not on file    Non-medical: Not on file  Tobacco Use  . Smoking status: Former Smoker    Types: Cigarettes    Last attempt to quit:  01/13/1980    Years since quitting: 37.3  . Smokeless tobacco: Never Used  Substance and Sexual Activity  . Alcohol use: Yes    Comment: occasionally  . Drug use: No  . Sexual activity: Not Currently    Partners: Female  Lifestyle  . Physical activity:    Days per week: Not on file    Minutes per session: Not on file  . Stress: Not on file  Relationships  . Social connections:    Talks on phone: Not on file    Gets together: Not on file    Attends religious service: Not on file    Active member of club or organization: Not on file    Attends meetings of clubs or organizations: Not on file    Relationship status: Not on file  Other Topics Concern  . Not on file  Social History Narrative   Lives with wife. Grew up in [place] and after college moved to [place]. Got married. Moved to [place] and then to [place], [place].    Attends [employer].    Masters health education, administration   3 children   Caffeine- 1 pot coffee daily.   Daughter is a Education officer, museum. Son is a Higher education careers adviser. Son is in [employer].       Current Outpatient Medications on File Prior to Visit  Medication Sig Dispense Refill  . amLODipine (NORVASC) 10 MG tablet Take 1 tablet (10 mg total) by mouth daily. 30 tablet 3  . aspirin EC 81 MG EC tablet Take 1 tablet (81 mg total) by mouth daily. 30 tablet   . cetirizine (ZYRTEC) 10 MG tablet Take 10 mg by mouth daily.    . clopidogrel (PLAVIX) 75 MG tablet Take 1 tablet (75 mg total) by mouth daily. 30 tablet 0  . escitalopram (LEXAPRO) 10 MG tablet 10 mg daily.    . fenofibrate (TRICOR) 145 MG tablet Take 145 mg by mouth daily.    . isosorbide mononitrate (IMDUR) 30 MG 24 hr tablet Take 1 tablet by mouth daily.    Marland Kitchen levothyroxine (SYNTHROID, LEVOTHROID) 50 MCG tablet Take 1 tablet by mouth daily.    Marland Kitchen lisinopril (PRINIVIL,ZESTRIL) 40 MG tablet Take 1 tablet (40 mg total) by mouth daily. 90 tablet 3  . metFORMIN (GLUCOPHAGE) 500 MG tablet Take  500 mg by mouth 2 (two) times daily.    . metoprolol succinate (TOPROL-XL) 50 MG 24 hr tablet Take 50 mg by mouth daily. Take with or immediately following a meal.    . Multiple Vitamin (MULTIVITAMIN) tablet Take 1 tablet  by mouth daily.      . nitroGLYCERIN (NITROSTAT) 0.4 MG SL tablet Place 0.4 mg under the tongue every 5 (five) minutes as needed for chest pain.    . Omega-3 Fatty Acids (OMEGA 3 PO) Take 1,000 mg by mouth daily.     . pravastatin (PRAVACHOL) 10 MG tablet Take 1 tablet by mouth daily.    Marland Kitchen rOPINIRole (REQUIP) 0.5 MG tablet Take 2 tablets by mouth at bedtime.     . Saw Palmetto 450 MG CAPS Take 1 capsule by mouth daily.    Marland Kitchen spironolactone (ALDACTONE) 25 MG tablet Take 1 tablet by mouth daily.    . [DISCONTINUED] rosuvastatin (CRESTOR) 40 MG tablet Take 1 tablet (40 mg total) by mouth daily. 30 tablet 6   No current facility-administered medications on file prior to visit.     Review of Systems  Constitutional: Negative for appetite change, chills, fever and unexpected weight change.       Patient reports that he feels fine in the office today despite an elevated blood pressure.  He reports his vision is fine.  He reports denies any numbness, tingling, or weakness in his upper or lower extremities.  He is thinking clearly.  His wife reports that she can always tell when he is having a TIA because his voice changes and it gets very weak and his speech sounds funny.  HENT: Positive for trouble swallowing. Negative for voice change.        He does report that he occasionally coughs after drinking liquids.  He reports that he has been seen by occupational therapy in the past and does not want to do any therapy.  He reports that he does not want any intervention related to his swallowing.  He reports that can occur with thin or thick liquids.  He reports that he might even cough with an eating ice cream.  Eyes: Negative for visual disturbance.  Respiratory: Negative for cough, chest  tightness, shortness of breath and wheezing.   Cardiovascular: Negative for chest pain, palpitations and leg swelling.  Gastrointestinal: Negative for abdominal pain, constipation, diarrhea, nausea and vomiting.  Genitourinary: Negative for decreased urine volume, dysuria and hematuria.  Musculoskeletal: Negative for arthralgias, gait problem, neck pain and neck stiffness.  Skin: Negative for rash.  Neurological: Negative for dizziness, tremors, syncope, facial asymmetry, weakness and headaches.  Hematological: Negative for adenopathy. Does not bruise/bleed easily.  Psychiatric/Behavioral: Negative for dysphoric mood, sleep disturbance and suicidal ideas. The patient is not nervous/anxious.      Objective:   BP (!) 180/78 (BP Location: Left Arm, Patient Position: Sitting, Cuff Size: Normal)   Pulse 67   Temp 98.9 F (37.2 C) (Temporal)   Resp 16   Ht 5\' 8"  (1.727 m)   Wt 191 lb 4 oz (86.8 kg)   SpO2 97%   BMI 29.08 kg/m   Physical Exam  Constitutional: He is oriented to person, place, and time. He appears well-developed and well-nourished.  HENT:  Head: Normocephalic and atraumatic.  Mouth/Throat: No oropharyngeal exudate.  Eyes: Pupils are equal, round, and reactive to light. Conjunctivae and EOM are normal. No scleral icterus.  Neck: Normal range of motion. Neck supple. No JVD present. No thyromegaly present.  Cardiovascular: Normal rate, regular rhythm and normal heart sounds.  Pulmonary/Chest: Effort normal and breath sounds normal.  Abdominal: Soft. Bowel sounds are normal. He exhibits no distension.  Musculoskeletal: He exhibits no edema.  Lymphadenopathy:    He has no cervical adenopathy.  Neurological: He is alert and oriented to person, place, and time. No cranial nerve deficit.  Skin: Skin is warm, dry and intact. Capillary refill takes less than 2 seconds.  Psychiatric: He has a normal mood and affect. His behavior is normal. Thought content normal.  Vitals  reviewed.    Assessment and Plan  1. Hypertension, unspecified type Long discussion with patient and his wife today regarding his uncontrolled blood pressure.  I discussed with him that I feel like this is the one component related to his TIAs/cardiovascular status that is not controlled.  I believe that his elevated blood pressures could be increasing his risk for stroke and TIA. - since patient did not have a decrease in his urinary symptoms with discontinuation of the diuretic we will plan to start triamterene HCTZ at this time.  Patient does not feel that he can remember or would like to take a medication 3 times a day at this point.  Therefore we will not initiate hydralazine at this time and will choose to start a clonidine patch.  Patient's heart rate remains in the low 60s and therefore he is not a candidate to increase his beta-blocker at this time.  He is already maxed out on a calcium channel blocker and ACE inhibitor.  He does have normal renal function as evidenced by his recent ED visit review of labs.  Will obtain renal artery ultrasound at this time to rule out any stenosis. At this time will add the clonidine patch.  They are to call with his blood pressures in 3 days and will consider titration up of the patch. He is to follow-up in 2 weeks. Patient is to watch/monitor his salt intake. - US Renal Artery Stenosis; Future - cloNIDine (CATAPRES - DOSED IN MG/24 HR) 0.2 mg/24hr patch; Place 1 patch (0.2 mg total) onto the skin once a week.  Dispense: 4 patch; Refill: 12 - triamterene-hydrochlorothiazide (MAXZIDE-25) 37.5-25 MG tablet; Take 1 tablet by mouth daily.  Dispense: 90 tablet; Refill: 3  2. Controlled type 2 diabetes mellitus with complication, without long-term current use of insulin (HCC) Blood sugars are controlled.  Continue metformin 500 mg twice a day.  He does not wish to decrease this dose at this time.  His A1c is approximately 6.5%.  No lesions on his feet.  Kidney  function is within normal limits.  I think it is fine to continue this medication at this time. Patient counseled in detail regarding the risks of medication. Told to call or return to clinic if develop any worrisome signs or symptoms. Patient voiced understanding.  He was counseled that he needs to maintain good hydration while taking this medication.   3. Coronary artery disease of native heart with stable angina pectoris, unspecified vessel or lesion type ([place]) Stable.  Continue Imdur. 4.  Stroke/recurrent TIAs Emergency department notes reviewed with patient.   He was asked to make sure he has a scheduled follow-up visit with his neurologist. Continue statin for secondary prevention. Continue aspirin and Plavix.  Return in about 2 weeks (around 05/25/2017). Caren Macadam, MD 05/11/2017

## 2017-05-11 NOTE — Patient Instructions (Addendum)
Call with BP readings in 3 days.  Start the clonidine patch to skin TODAY.  Start the triamterene/hctz each morning. You make take the first dose today when you pick it up and then take in the morning.  Follow up in 2 weeks. Get the ultrasound of your renal aterties.   Follow up with urology.

## 2017-05-13 ENCOUNTER — Telehealth: Payer: Self-pay | Admitting: Family Medicine

## 2017-05-13 NOTE — Telephone Encounter (Signed)
Pt is calling with BP reading   11am   185/84 Pulse 59

## 2017-05-14 ENCOUNTER — Telehealth: Payer: Self-pay | Admitting: Family Medicine

## 2017-05-14 MED ORDER — CLONIDINE 0.3 MG/24HR TD PTWK: 0.3000 mg | MEDICATED_PATCH | TRANSDERMAL | 12 refills | Status: DC

## 2017-05-14 NOTE — Telephone Encounter (Signed)
FYI

## 2017-05-14 NOTE — Telephone Encounter (Signed)
Just received message that was evidently taken yesterday at 11 am. And not put in the system until after I had left the office today. Logged back in to complete charts and saw message. Patient had called into the office with a BP reading of 180s.   He is doing well. BP running 170. No problems. NO CP, SOB, s/s of TIA. Feels good.  He will start the new medication, clonidine 0.3 mg/24 hour/patch q week. Will call back with BP next week. Will d/c the other patch (0.2 mg) when he puts the other one on. All questions answered.   Will call in clonidine 0.3 mg patch/24hr. Discontinue the other patch, take off and apply new patch.

## 2017-05-25 ENCOUNTER — Ambulatory Visit: Payer: Medicare HMO | Admitting: Urology

## 2017-05-25 DIAGNOSIS — R32 Unspecified urinary incontinence: Secondary | ICD-10-CM | POA: Diagnosis not present

## 2017-05-25 DIAGNOSIS — R3915 Urgency of urination: Secondary | ICD-10-CM | POA: Diagnosis not present

## 2017-05-25 DIAGNOSIS — R350 Frequency of micturition: Secondary | ICD-10-CM

## 2017-05-26 ENCOUNTER — Ambulatory Visit (INDEPENDENT_AMBULATORY_CARE_PROVIDER_SITE_OTHER): Payer: Medicare HMO | Admitting: Family Medicine

## 2017-05-26 ENCOUNTER — Encounter: Payer: Self-pay | Admitting: Family Medicine

## 2017-05-26 VITALS — BP 122/70 | HR 63 | Resp 16 | Ht 68.0 in | Wt 189.0 lb

## 2017-05-26 DIAGNOSIS — I10 Essential (primary) hypertension: Secondary | ICD-10-CM

## 2017-05-26 DIAGNOSIS — Z23 Encounter for immunization: Secondary | ICD-10-CM | POA: Diagnosis not present

## 2017-05-26 MED ORDER — CLONIDINE 0.3 MG/24HR TD PTWK: 0.3000 mg | MEDICATED_PATCH | TRANSDERMAL | 12 refills | Status: DC

## 2017-05-26 NOTE — Progress Notes (Signed)
Patient ID: Steve Ramirez, male    DOB: 1935-09-26, 82 y.o.   MRN: [id number]  Chief Complaint  Patient presents with  . Hypertension    2 week follow up for BP     Allergies Patient has no known allergies.  Subjective:   Steve Ramirez is a 82 y.o. male who presents to [facility] today.  HPI [name] is here today for follow-up of his blood pressure.  We last went up on his  clonidine on May 5.  He has been using the clonidine patch 0.3 mg / 24 hours.  He reports that his blood pressures have been running better.  He has not had any side effects with the medication.  He is feeling good.  Reports he is active.  Denies any chest pain, shortness of breath, swelling in his extremities.  Denies any signs or symptoms consistent with TIA.  Denies any orthostatic hypotension.  His mood is good.  Sleeping well.  Appetite is good.  Does not have any complaints.  He is happy his blood pressure is running well.  Last Pneumovax 23 was greater than 10 years ago.  He was seen by urology this week.  He was started on oxybutynin for his bladder.  The urologist did not recommend the condom catheters.  He was happy with his urologic evaluation.   Past Medical History:  Diagnosis Date  . Coronary atherosclerosis of native coronary artery   . Essential hypertension, benign   . GIB (gastrointestinal bleeding)    Colonic diverticular bleed  . History of melanoma   . History of TIAs   . Stroke ([place])   . Type 2 diabetes mellitus ([place])     Past Surgical History:  Procedure Laterality Date  . COLONOSCOPY  08/13/2010   Procedure: COLONOSCOPY;  Surgeon: Rogene Houston, MD;  Location[facility];  Service: Endoscopy;  Laterality: N/A;  . KNEE SURGERY     arthroscopic  . LEFT HEART CATHETERIZATION WITH CORONARY ANGIOGRAM N/A 11/24/2010   Procedure: LEFT HEART CATHETERIZATION WITH CORONARY ANGIOGRAM;  Surgeon: Josue Hector, MD;  Location: [facility] CATH LAB;  Service:  Cardiovascular;  Laterality: N/A;  . neck fusion     years ago    Family History  Problem Relation Age of Onset  . Coronary artery disease Father   . Asthma Unknown   . Emphysema Mother   . Coronary artery disease Paternal Uncle   . Brain cancer Brother      Social History   Socioeconomic History  . Marital status: Married    Spouse name: Not on file  . Number of children: Not on file  . Years of education: Not on file  . Highest education level: Not on file  Occupational History  . Occupation: retired  Scientific laboratory technician  . Financial resource strain: Not on file  . Food insecurity:    Worry: Not on file    Inability: Not on file  . Transportation needs:    Medical: Not on file    Non-medical: Not on file  Tobacco Use  . Smoking status: Former Smoker    Types: Cigarettes    Last attempt to quit: 01/13/1980    Years since quitting: 37.3  . Smokeless tobacco: Never Used  Substance and Sexual Activity  . Alcohol use: Yes    Comment: occasionally  . Drug use: No  . Sexual activity: Not Currently    Partners: Female  Lifestyle  . Physical  activity:    Days per week: Not on file    Minutes per session: Not on file  . Stress: Not on file  Relationships  . Social connections:    Talks on phone: Not on file    Gets together: Not on file    Attends religious service: Not on file    Active member of club or organization: Not on file    Attends meetings of clubs or organizations: Not on file    Relationship status: Not on file  Other Topics Concern  . Not on file  Social History Narrative   Lives with wife. Grew up in [place] and after college moved to [place]. Got married. Moved to [place] and then to [place], [place].    Attends [employer].    Masters health education, administration   3 children   Caffeine- 1 pot coffee daily.   Daughter is a Education officer, museum. Son is a Higher education careers adviser. Son is in [employer] [place] and Re[employer].        Review of Systems  Constitutional:  Negative for appetite change, chills, fever and unexpected weight change.  HENT: Negative for trouble swallowing and voice change.   Eyes: Negative for visual disturbance.  Respiratory: Negative for cough, chest tightness, shortness of breath and wheezing.   Cardiovascular: Negative for chest pain, palpitations and leg swelling.  Gastrointestinal: Negative for abdominal pain, diarrhea, nausea and vomiting.  Genitourinary: Negative for decreased urine volume, dysuria and frequency.  Skin: Negative for rash.  Neurological: Negative for dizziness, tremors, syncope, facial asymmetry, weakness and headaches.  Hematological: Negative for adenopathy. Does not bruise/bleed easily.     Objective:   BP 122/70   Pulse 63   Resp 16   Ht 5\' 8"  (1.727 m)   Wt 189 lb (85.7 kg)   SpO2 93%   BMI 28.74 kg/m   Physical Exam  Constitutional: He is oriented to person, place, and time. He appears well-developed and well-nourished.  HENT:  Head: Normocephalic and atraumatic.  Eyes: Pupils are equal, round, and reactive to light. EOM are normal.  Neck: Normal range of motion. Neck supple.  Cardiovascular: Normal rate, regular rhythm and normal heart sounds.  Pulmonary/Chest: Effort normal and breath sounds normal. He has no wheezes.  Musculoskeletal: He exhibits no edema.  Neurological: He is alert and oriented to person, place, and time. No cranial nerve deficit.  Skin: Skin is warm, dry and intact.  Psychiatric: He has a normal mood and affect. His behavior is normal. Thought content normal.  Vitals reviewed.    Assessment and Plan  1. Essential hypertension, benign Patient's blood pressure is finally at goal.  He is doing well on his medications with no side effects.  We did discuss that his blood pressure being controlled would decrease his risk of recurrent TIA/stroke hopefully.  He will call if he has any problems.  He will continue his medications as directed.  Patient counseled in detail  regarding the risks of medication. Told to call or return to clinic if develop any worrisome signs or symptoms. Patient voiced understanding.   Medication refilled - cloNIDine (CATAPRES - DOSED IN MG/24 HR) 0.3 mg/24hr patch; Place 1 patch (0.3 mg total) onto the skin once a week.  Dispense: 4 patch; Refill: 12  2. Need for pneumococcal vaccination Pneumonia 23 vaccination given today. Patient defers tetanus.  Return in about 3 months (around 08/26/2017). Caren Macadam, MD 05/26/2017

## 2017-05-26 NOTE — Addendum Note (Signed)
Addended by: Caren Macadam on: 05/26/2017 02:58 PM   Modules accepted: Orders

## 2017-06-10 ENCOUNTER — Other Ambulatory Visit: Payer: Self-pay

## 2017-06-10 MED ORDER — METFORMIN HCL 500 MG PO TABS: 500.0000 mg | ORAL_TABLET | Freq: Two times a day (BID) | ORAL | 0 refills | Status: DC

## 2017-06-10 MED ORDER — SPIRONOLACTONE 25 MG PO TABS: 25.0000 mg | ORAL_TABLET | Freq: Every day | ORAL | 0 refills | Status: DC

## 2017-06-10 MED ORDER — ROPINIROLE HCL 0.5 MG PO TABS: 1.0000 mg | ORAL_TABLET | Freq: Every day | ORAL | 0 refills | Status: DC

## 2017-06-10 MED ORDER — FENOFIBRATE 145 MG PO TABS: 145.0000 mg | ORAL_TABLET | Freq: Every day | ORAL | 0 refills | Status: DC

## 2017-06-10 MED ORDER — CLOPIDOGREL BISULFATE 75 MG PO TABS: 75.0000 mg | ORAL_TABLET | Freq: Every day | ORAL | 0 refills | Status: DC

## 2017-06-10 MED ORDER — METOPROLOL SUCCINATE ER 50 MG PO TB24: 50.0000 mg | ORAL_TABLET | Freq: Every day | ORAL | 0 refills | Status: DC

## 2017-06-10 MED ORDER — ESCITALOPRAM OXALATE 10 MG PO TABS: 10.0000 mg | ORAL_TABLET | Freq: Every day | ORAL | 0 refills | Status: DC

## 2017-06-10 MED ORDER — LEVOTHYROXINE SODIUM 50 MCG PO TABS: 50.0000 ug | ORAL_TABLET | Freq: Every day | ORAL | 0 refills | Status: DC

## 2017-06-17 ENCOUNTER — Encounter: Payer: Self-pay | Admitting: Family Medicine

## 2017-06-18 ENCOUNTER — Encounter: Payer: Self-pay | Admitting: Family Medicine

## 2017-07-12 ENCOUNTER — Telehealth: Payer: Self-pay | Admitting: Orthopedic Surgery

## 2017-07-12 NOTE — Telephone Encounter (Signed)
When he was here in Feb, we offered a hip injection, but never heard back if he could hold the blood thinners for injection.   Due to 2 recent strokes he could not d/c the blood thinners.   Is there anything further to offer, or do you just need to see him in follow up ?

## 2017-07-12 NOTE — Telephone Encounter (Signed)
Tylenol 500 mg q 6 as needed

## 2017-07-12 NOTE — Telephone Encounter (Signed)
Patient's wife states patient is having a lot of pain in his hip and was wondering if there is anything Dr. Aline Brochure can do.  Would you please call and speak with Vaughan Basta? She is very concerned and would like to talk with someone to find out what to do.

## 2017-07-13 NOTE — Telephone Encounter (Signed)
I have called him back to advise. Left message for him to call me back.

## 2017-07-13 NOTE — Telephone Encounter (Signed)
I have called patient and advised, he will try the Tylenol every 6 hours and see if this helps

## 2017-07-27 ENCOUNTER — Ambulatory Visit: Payer: Medicare HMO | Admitting: Urology

## 2017-07-27 DIAGNOSIS — R3915 Urgency of urination: Secondary | ICD-10-CM | POA: Diagnosis not present

## 2017-07-27 DIAGNOSIS — R350 Frequency of micturition: Secondary | ICD-10-CM | POA: Diagnosis not present

## 2017-07-27 DIAGNOSIS — R32 Unspecified urinary incontinence: Secondary | ICD-10-CM | POA: Diagnosis not present

## 2017-08-05 DIAGNOSIS — E782 Mixed hyperlipidemia: Secondary | ICD-10-CM | POA: Diagnosis not present

## 2017-08-05 DIAGNOSIS — Z1389 Encounter for screening for other disorder: Secondary | ICD-10-CM | POA: Diagnosis not present

## 2017-08-05 DIAGNOSIS — Z0001 Encounter for general adult medical examination with abnormal findings: Secondary | ICD-10-CM | POA: Diagnosis not present

## 2017-08-05 DIAGNOSIS — Z125 Encounter for screening for malignant neoplasm of prostate: Secondary | ICD-10-CM | POA: Diagnosis not present

## 2017-08-05 DIAGNOSIS — Z6829 Body mass index (BMI) 29.0-29.9, adult: Secondary | ICD-10-CM | POA: Diagnosis not present

## 2017-08-05 DIAGNOSIS — I2510 Atherosclerotic heart disease of native coronary artery without angina pectoris: Secondary | ICD-10-CM | POA: Diagnosis not present

## 2017-08-05 DIAGNOSIS — E119 Type 2 diabetes mellitus without complications: Secondary | ICD-10-CM | POA: Diagnosis not present

## 2017-08-05 DIAGNOSIS — I10 Essential (primary) hypertension: Secondary | ICD-10-CM | POA: Diagnosis not present

## 2017-08-05 DIAGNOSIS — E663 Overweight: Secondary | ICD-10-CM | POA: Diagnosis not present

## 2017-08-12 ENCOUNTER — Encounter (INDEPENDENT_AMBULATORY_CARE_PROVIDER_SITE_OTHER): Payer: Self-pay | Admitting: *Deleted

## 2017-08-24 ENCOUNTER — Telehealth (INDEPENDENT_AMBULATORY_CARE_PROVIDER_SITE_OTHER): Payer: Self-pay | Admitting: *Deleted

## 2017-08-24 NOTE — Telephone Encounter (Signed)
Patient on recall for 7 yrs TCS -- patient concerned if he needs due to health conditions -- he has had 2 strokes (2017 & 2018), he is on Plavix & ASA -- he is being treated for really high blood pressure -- said he will be 82 in October and there probably isn't much that could be done if something was found -- please advise

## 2017-08-26 ENCOUNTER — Ambulatory Visit (INDEPENDENT_AMBULATORY_CARE_PROVIDER_SITE_OTHER): Payer: Medicare HMO | Admitting: Family Medicine

## 2017-08-26 ENCOUNTER — Encounter: Payer: Self-pay | Admitting: Family Medicine

## 2017-08-26 VITALS — BP 114/82 | HR 64 | Ht 68.5 in | Wt 186.0 lb

## 2017-08-26 DIAGNOSIS — E118 Type 2 diabetes mellitus with unspecified complications: Secondary | ICD-10-CM | POA: Diagnosis not present

## 2017-08-26 DIAGNOSIS — E782 Mixed hyperlipidemia: Secondary | ICD-10-CM

## 2017-08-26 DIAGNOSIS — I10 Essential (primary) hypertension: Secondary | ICD-10-CM | POA: Diagnosis not present

## 2017-08-26 NOTE — Progress Notes (Signed)
Patient ID: Steve Ramirez, male    DOB: 07/22/1935, 82 y.o.   MRN: [id number]  Chief Complaint  Patient presents with  . Hypertension    follow up  . Hyperlipidemia  . Diabetes    Allergies Patient has no known allergies.  Subjective:   Steve Ramirez is a 82 y.o. male who presents to [facility] today.  HPI Here for follow up. Reports that he has been doing pretty well. Reports that has been feeling a bit down but does not want to go up on the lexapro. Feels like she needs to start back exercising. Wife reports he is worried about getting old. Reports that since last stroke in 11/2016 he has not been able to be as functional. Reports that feels down at times. Energy is ok but makes him feel bad b/c he cannot go out and play golf and be as active. Sleeping ok at night. Appetite is good. No SI/HI. Has not been going to the [facility] b/c of "pride". Reports that gets down b/c does not have the stamina that he used to. Reports that his brother is older and is in better shape.  No CP or SOB. No edema in lower extremities. No lesions on feet. Only gets some tingling in big toes, has had for years. Vision is good.   Hypertension  This is a chronic problem. The current episode started more than 1 year ago. The problem has been resolved since onset. The problem is controlled. Pertinent negatives include no anxiety, blurred vision, chest pain, headaches, malaise/fatigue, neck pain, orthopnea, palpitations, peripheral edema, PND or shortness of breath. There are no associated agents to hypertension. Risk factors for coronary artery disease include dyslipidemia and male gender. Past treatments include calcium channel blockers, central alpha agonists and lifestyle changes. The current treatment provides significant improvement. There are no compliance problems.  Hypertensive end-organ damage includes CVA. Identifiable causes of hypertension include a thyroid problem.  Hyperlipidemia   This is a chronic problem. The current episode started more than 1 year ago. The problem is controlled. Recent lipid tests were reviewed and are normal. Exacerbating diseases include diabetes and hypothyroidism. He has no history of obesity or nephrotic syndrome. Factors aggravating his hyperlipidemia include fatty foods. Pertinent negatives include no chest pain, focal sensory loss, focal weakness, leg pain, myalgias or shortness of breath. Current antihyperlipidemic treatment includes diet change, exercise, statins and fibric acid derivatives. The current treatment provides significant improvement of lipids. Risk factors for coronary artery disease include diabetes mellitus, dyslipidemia, hypertension and male sex.  Diabetes  He presents for his follow-up diabetic visit. He has type 2 diabetes mellitus. No MedicAlert identification noted. His disease course has been stable. There are no hypoglycemic associated symptoms. Pertinent negatives for hypoglycemia include no headaches. Pertinent negatives for diabetes include no blurred vision, no chest pain, no fatigue, no foot paresthesias, no foot ulcerations, no polydipsia, no polyphagia, no polyuria, no visual change and no weakness. There are no hypoglycemic complications. Symptoms are stable. Diabetic complications include a CVA. Risk factors for coronary artery disease include diabetes mellitus, dyslipidemia, family history, male sex and hypertension. Current diabetic treatment includes diet and oral agent (monotherapy). He is compliant with treatment all of the time. His weight is decreasing steadily. He is following a diabetic, generally healthy and high fiber diet. He has had a previous visit with a dietitian. He participates in exercise intermittently. There is no change in his home blood glucose trend. An ACE inhibitor/angiotensin  II receptor blocker is being taken. He does not see a podiatrist.Eye exam is current.    Past Medical History:  Diagnosis  Date  . Coronary atherosclerosis of native coronary artery   . Essential hypertension, benign   . GIB (gastrointestinal bleeding)    Colonic diverticular bleed  . History of melanoma   . History of TIAs   . Stroke ([place])   . Type 2 diabetes mellitus ([place])     Past Surgical History:  Procedure Laterality Date  . COLONOSCOPY  08/13/2010   Procedure: COLONOSCOPY;  Surgeon: Rogene Houston, MD;  Location[facility];  Service: Endoscopy;  Laterality: N/A;  . KNEE SURGERY     arthroscopic  . LEFT HEART CATHETERIZATION WITH CORONARY ANGIOGRAM N/A 11/24/2010   Procedure: LEFT HEART CATHETERIZATION WITH CORONARY ANGIOGRAM;  Surgeon: Josue Hector, MD;  Location: [facility] CATH LAB;  Service: Cardiovascular;  Laterality: N/A;  . neck fusion     years ago    Family History  Problem Relation Age of Onset  . Coronary artery disease Father   . Asthma Unknown   . Emphysema Mother   . Coronary artery disease Paternal Uncle   . Brain cancer Brother      Social History   Socioeconomic History  . Marital status: Married    Spouse name: Not on file  . Number of children: Not on file  . Years of education: Not on file  . Highest education level: Not on file  Occupational History  . Occupation: retired  Scientific laboratory technician  . Financial resource strain: Not on file  . Food insecurity:    Worry: Not on file    Inability: Not on file  . Transportation needs:    Medical: Not on file    Non-medical: Not on file  Tobacco Use  . Smoking status: Former Smoker    Types: Cigarettes    Last attempt to quit: 01/13/1980    Years since quitting: 37.6  . Smokeless tobacco: Never Used  Substance and Sexual Activity  . Alcohol use: Yes    Comment: occasionally  . Drug use: No  . Sexual activity: Not Currently    Partners: Female  Lifestyle  . Physical activity:    Days per week: Not on file    Minutes per session: Not on file  . Stress: Not on file  Relationships  . Social connections:    Talks on  phone: Not on file    Gets together: Not on file    Attends religious service: Not on file    Active member of club or organization: Not on file    Attends meetings of clubs or organizations: Not on file    Relationship status: Not on file  Other Topics Concern  . Not on file  Social History Narrative   Lives with wife. Grew up in [place] and after college moved to [place]. Got married. Moved to [place] and then to [place], [place].    Attends [employer].    Masters health education, administration   3 children   Caffeine- 1 pot coffee daily.   Daughter is a Education officer, museum. Son is a Higher education careers adviser. Son is in [employer] and Recreation.        Review of Systems  Constitutional: Negative for fatigue and malaise/fatigue.  Eyes: Negative for blurred vision.  Respiratory: Negative for shortness of breath.   Cardiovascular: Negative for chest pain, palpitations, orthopnea and PND.  Endocrine: Negative for polydipsia, polyphagia and polyuria.  Musculoskeletal: Negative for myalgias and neck pain.  Neurological: Negative for focal weakness, weakness and headaches.     Objective:   BP 114/82   Pulse 64   Ht 5' 8.5" (1.74 m)   Wt 186 lb (84.4 kg)   SpO2 96%   BMI 27.87 kg/m   Physical Exam  Constitutional: He is oriented to person, place, and time. He appears well-developed and well-nourished.  HENT:  Head: Normocephalic and atraumatic.  Eyes: Pupils are equal, round, and reactive to light. EOM are normal. No scleral icterus.  Neck: Normal range of motion. Neck supple. No JVD present. No thyromegaly present.  Cardiovascular: Normal rate, regular rhythm and normal heart sounds.  Pulmonary/Chest: Effort normal and breath sounds normal.  Musculoskeletal: He exhibits no edema.  Neurological: He is alert and oriented to person, place, and time. No cranial nerve deficit.  Skin: Skin is warm, dry and intact. Capillary refill takes less than 2 seconds.  Psychiatric: He has a normal  mood and affect. His behavior is normal. Thought content normal.  Vitals reviewed.   Depression screen [facility] 2/9 08/26/2017 05/26/2017 04/01/2017  Decreased Interest 1 1 1   Down, Depressed, Hopeless 1 1 1   PHQ - 2 Score 2 2 2   Altered sleeping 0 1 1  Tired, decreased energy 0 1 0  Change in appetite 0 2 0  Feeling bad or failure about yourself  0 0 1  Trouble concentrating 0 1 0  Moving slowly or fidgety/restless 2 1 0  Suicidal thoughts 0 0 0  PHQ-9 Score 4 8 4   Difficult doing work/chores Not difficult at all Somewhat difficult Somewhat difficult   Current Outpatient Medications on File Prior to Visit  Medication Sig Dispense Refill  . amLODipine (NORVASC) 10 MG tablet Take 1 tablet (10 mg total) by mouth daily. 30 tablet 3  . aspirin EC 81 MG EC tablet Take 1 tablet (81 mg total) by mouth daily. 30 tablet   . cetirizine (ZYRTEC) 10 MG tablet Take 10 mg by mouth daily.    . cloNIDine (CATAPRES - DOSED IN MG/24 HR) 0.3 mg/24hr patch Place 1 patch (0.3 mg total) onto the skin once a week. 4 patch 12  . clopidogrel (PLAVIX) 75 MG tablet Take 1 tablet (75 mg total) by mouth daily. 90 tablet 0  . escitalopram (LEXAPRO) 10 MG tablet Take 1 tablet (10 mg total) by mouth daily. 90 tablet 0  . fenofibrate (TRICOR) 145 MG tablet Take 1 tablet (145 mg total) by mouth daily. 90 tablet 0  . isosorbide mononitrate (IMDUR) 30 MG 24 hr tablet Take 1 tablet by mouth daily.    Marland Kitchen levothyroxine (SYNTHROID, LEVOTHROID) 50 MCG tablet Take 1 tablet (50 mcg total) by mouth daily. 90 tablet 0  . lisinopril (PRINIVIL,ZESTRIL) 40 MG tablet Take 1 tablet (40 mg total) by mouth daily. 90 tablet 3  . metFORMIN (GLUCOPHAGE) 500 MG tablet Take 1 tablet (500 mg total) by mouth 2 (two) times daily. 180 tablet 0  . metoprolol succinate (TOPROL-XL) 50 MG 24 hr tablet Take 1 tablet (50 mg total) by mouth daily. Take with or immediately following a meal. 90 tablet 0  . mirabegron ER (MYRBETRIQ) 50 MG TB24 tablet Take 50 mg  by mouth daily.    . Multiple Vitamin (MULTIVITAMIN) tablet Take 1 tablet by mouth daily.      . nitroGLYCERIN (NITROSTAT) 0.4 MG SL tablet Place 0.4 mg under the tongue every 5 (five) minutes as needed for chest pain.    Marland Kitchen  Omega-3 Fatty Acids (OMEGA 3 PO) Take 1,000 mg by mouth daily.     . pravastatin (PRAVACHOL) 10 MG tablet Take 1 tablet by mouth daily.    Marland Kitchen rOPINIRole (REQUIP) 0.5 MG tablet Take 2 tablets (1 mg total) by mouth at bedtime. 60 tablet 0  . Saw Palmetto 450 MG CAPS Take 1 capsule by mouth daily.    Marland Kitchen spironolactone (ALDACTONE) 25 MG tablet Take 1 tablet (25 mg total) by mouth daily. 90 tablet 0  . tamsulosin (FLOMAX) 0.4 MG CAPS capsule Take 0.4 mg by mouth.    . triamterene-hydrochlorothiazide (MAXZIDE-25) 37.5-25 MG tablet Take 1 tablet by mouth daily. 90 tablet 3  . [DISCONTINUED] rosuvastatin (CRESTOR) 40 MG tablet Take 1 tablet (40 mg total) by mouth daily. 30 tablet 6   No current facility-administered medications on file prior to visit.     Assessment and Plan  1. Essential hypertension, benign Lifestyle modifications discussed with patient including a diet emphasizing vegetables, fruits, and whole grains. Limiting intake of sodium to less than 2,400 mg per day.  Recommendations discussed include consuming low-fat dairy products, poultry, fish, legumes, non-tropical vegetable oils, and nuts; and limiting intake of sweets, sugar-sweetened beverages, and red meat. Discussed following a plan such as the Dietary Approaches to Stop Hypertension (DASH) diet. Patient to read up on this diet.  Continue medications. Check BP at home and call if problems or changes.  2. Controlled type 2 diabetes mellitus with complication, without long-term current use of insulin (HCC) Continue medications as directed. Stable. Check labs in 10/2017.  3. Mixed hyperlipidemia Hyperlipidemia and the associated risk of ASCVD were discussed today. Primary vs. Secondary prevention of ASCVD were  discussed and how it relates to patient morbidity, mortality, and quality of life. Shared decision making with patient including the risks of statins vs.benefits of ASCVD risk reduction discussed.  Risks of stains discussed including myopathy, rhabdomyoloysis, liver problems, increased risk of diabetes discussed. We discussed heart healthy diet, lifestyle modifications, risk factor modifications, and adherence to the recommended treatment plan. We discussed the need to periodically monitor lipid panel and liver function tests while on statin therapy.  -Stable. Continue.  Follow up with neurology as directed. Continue all medications for CVA and prevention of future CVA. Continue ASA and plavix. Keep scheduled visit with Dr. Kim/Cardiology.   No follow-ups on file.Follow up with new PCP in 2 months, or sooner if needed. Is going to return to Dr. Hilma Favors for his PCP needs.  Caren Macadam, MD 08/26/2017

## 2017-08-26 NOTE — Patient Instructions (Signed)

## 2017-09-09 NOTE — Telephone Encounter (Signed)
I agree not to proceed with colonoscopy given his health issues. Will change plans if he has symptoms pertaining to his colon. Let patient know.

## 2017-09-14 NOTE — Telephone Encounter (Signed)
Patient aware.

## 2017-09-27 DIAGNOSIS — M25551 Pain in right hip: Secondary | ICD-10-CM | POA: Diagnosis not present

## 2017-09-29 ENCOUNTER — Other Ambulatory Visit: Payer: Self-pay | Admitting: Family Medicine

## 2017-10-01 DIAGNOSIS — M25551 Pain in right hip: Secondary | ICD-10-CM | POA: Diagnosis not present

## 2017-10-18 DIAGNOSIS — R69 Illness, unspecified: Secondary | ICD-10-CM | POA: Diagnosis not present

## 2017-12-14 DIAGNOSIS — I2510 Atherosclerotic heart disease of native coronary artery without angina pectoris: Secondary | ICD-10-CM | POA: Diagnosis not present

## 2017-12-14 DIAGNOSIS — I10 Essential (primary) hypertension: Secondary | ICD-10-CM | POA: Diagnosis not present

## 2018-01-12 ENCOUNTER — Other Ambulatory Visit: Payer: Self-pay | Admitting: Family Medicine

## 2018-01-17 DIAGNOSIS — M25551 Pain in right hip: Secondary | ICD-10-CM | POA: Diagnosis not present

## 2018-01-20 DIAGNOSIS — E119 Type 2 diabetes mellitus without complications: Secondary | ICD-10-CM | POA: Diagnosis not present

## 2018-01-20 DIAGNOSIS — Z1389 Encounter for screening for other disorder: Secondary | ICD-10-CM | POA: Diagnosis not present

## 2018-01-20 DIAGNOSIS — I639 Cerebral infarction, unspecified: Secondary | ICD-10-CM | POA: Diagnosis not present

## 2018-01-20 DIAGNOSIS — I10 Essential (primary) hypertension: Secondary | ICD-10-CM | POA: Diagnosis not present

## 2018-01-20 DIAGNOSIS — E782 Mixed hyperlipidemia: Secondary | ICD-10-CM | POA: Diagnosis not present

## 2018-01-20 DIAGNOSIS — I2510 Atherosclerotic heart disease of native coronary artery without angina pectoris: Secondary | ICD-10-CM | POA: Diagnosis not present

## 2018-01-20 DIAGNOSIS — G2581 Restless legs syndrome: Secondary | ICD-10-CM | POA: Diagnosis not present

## 2018-01-20 DIAGNOSIS — E7849 Other hyperlipidemia: Secondary | ICD-10-CM | POA: Diagnosis not present

## 2018-01-20 DIAGNOSIS — Z6830 Body mass index (BMI) 30.0-30.9, adult: Secondary | ICD-10-CM | POA: Diagnosis not present

## 2018-01-20 DIAGNOSIS — I651 Occlusion and stenosis of basilar artery: Secondary | ICD-10-CM | POA: Diagnosis not present

## 2018-01-20 DIAGNOSIS — E6609 Other obesity due to excess calories: Secondary | ICD-10-CM | POA: Diagnosis not present

## 2018-02-24 DIAGNOSIS — L308 Other specified dermatitis: Secondary | ICD-10-CM | POA: Diagnosis not present

## 2018-02-24 DIAGNOSIS — B86 Scabies: Secondary | ICD-10-CM | POA: Diagnosis not present

## 2018-06-13 DIAGNOSIS — M25551 Pain in right hip: Secondary | ICD-10-CM | POA: Diagnosis not present

## 2018-06-13 DIAGNOSIS — M1611 Unilateral primary osteoarthritis, right hip: Secondary | ICD-10-CM | POA: Diagnosis not present

## 2018-06-17 DIAGNOSIS — M1611 Unilateral primary osteoarthritis, right hip: Secondary | ICD-10-CM | POA: Diagnosis not present

## 2018-06-23 DIAGNOSIS — I2510 Atherosclerotic heart disease of native coronary artery without angina pectoris: Secondary | ICD-10-CM | POA: Diagnosis not present

## 2018-06-23 DIAGNOSIS — I10 Essential (primary) hypertension: Secondary | ICD-10-CM | POA: Diagnosis not present

## 2018-06-23 DIAGNOSIS — Z01818 Encounter for other preprocedural examination: Secondary | ICD-10-CM | POA: Diagnosis not present

## 2018-06-28 DIAGNOSIS — I10 Essential (primary) hypertension: Secondary | ICD-10-CM | POA: Diagnosis not present

## 2018-06-28 DIAGNOSIS — I651 Occlusion and stenosis of basilar artery: Secondary | ICD-10-CM | POA: Diagnosis not present

## 2018-06-28 DIAGNOSIS — I639 Cerebral infarction, unspecified: Secondary | ICD-10-CM | POA: Diagnosis not present

## 2018-06-28 DIAGNOSIS — Z6829 Body mass index (BMI) 29.0-29.9, adult: Secondary | ICD-10-CM | POA: Diagnosis not present

## 2018-06-28 DIAGNOSIS — E119 Type 2 diabetes mellitus without complications: Secondary | ICD-10-CM | POA: Diagnosis not present

## 2018-06-28 DIAGNOSIS — I2510 Atherosclerotic heart disease of native coronary artery without angina pectoris: Secondary | ICD-10-CM | POA: Diagnosis not present

## 2018-06-28 DIAGNOSIS — Z01818 Encounter for other preprocedural examination: Secondary | ICD-10-CM | POA: Diagnosis not present

## 2018-06-28 DIAGNOSIS — Z01810 Encounter for preprocedural cardiovascular examination: Secondary | ICD-10-CM | POA: Diagnosis not present

## 2018-06-28 DIAGNOSIS — Z1389 Encounter for screening for other disorder: Secondary | ICD-10-CM | POA: Diagnosis not present

## 2018-07-07 ENCOUNTER — Encounter: Payer: Self-pay | Admitting: *Deleted

## 2018-07-07 DIAGNOSIS — M1611 Unilateral primary osteoarthritis, right hip: Secondary | ICD-10-CM | POA: Diagnosis present

## 2018-07-07 NOTE — H&P (Signed)
HIP ARTHROPLASTY ADMISSION H&P  Patient ID: Steve Ramirez MRN: [id number] DOB/AGE: [birth year] 83 y.o.  Chief Complaint: right hip pain.  Planned Procedure Date: 08/02/18 Medical Clearance by Dr. Hilma Favors     Cardiac Clearance by Dr. Shirlee More Neurology Clearance by Dr. Leta Baptist  HPI: Steve Ramirez is a 83 y.o. male with a history of CAD, NSTEMI, HTN, DM, TIA, and melanoma who presents for evaluation of OA RIGHT HIP. The patient has a history of pain and functional disability in the right hip due to arthritis and has failed non-surgical conservative treatments for greater than 12 weeks to include NSAID's and/or analgesics, corticosteriod injections and activity modification.  Onset of symptoms was gradual, starting 3 years ago with gradually worsening course since that time.  Patient currently rates pain at 9 out of 10 with activity. Patient has night pain, worsening of pain with activity and weight bearing and pain that interferes with activities of daily living.  Patient has evidence of subchondral cysts, subchondral sclerosis, periarticular osteophytes and joint space narrowing by imaging studies.  There is no active infection.  Past Medical History:  Diagnosis Date  . Coronary atherosclerosis of native coronary artery   . Essential hypertension, benign   . GIB (gastrointestinal bleeding)    Colonic diverticular bleed  . History of melanoma   . History of TIAs   . Stroke ([place])   . Type 2 diabetes mellitus ([place])    Past Surgical History:  Procedure Laterality Date  . COLONOSCOPY  08/13/2010   Procedure: COLONOSCOPY;  Surgeon: Rogene Houston, MD;  [facility];  Service: Endoscopy;  Laterality: N/A;  . KNEE SURGERY     arthroscopic  . LEFT HEART CATHETERIZATION WITH CORONARY ANGIOGRAM N/A 11/24/2010   Procedure: LEFT HEART CATHETERIZATION WITH CORONARY ANGIOGRAM;  Surgeon: Josue Hector, MD;  Location: [facility] CATH LAB;  Service: Cardiovascular;  Laterality: N/A;   . neck fusion     years ago   No Known Allergies   Medications: Multivitamin daily Nitrostat 0.4 mg as needed Omega-3 fatty acid [phone] mg daily 81 mg aspirin daily Isosorbide mononitrate ER 30 mg daily Oxybutynin chloride 1 every 8 hours Pravastatin sodium 10 mg daily Saw palmetto 160 mg daily Zyrtec 10 mg daily Plavix 75 mg daily Escitalopram 10 mg daily Levothyroxine 50 mcg daily Tamsulosin 0.4 mg Daily Amlodipine 10 mg daily Clonidine 0.3 mg every 24 hour transdermal patch weekly Lisinopril 40 mg daily Metoprolol succinate ER 25 mg daily Triamterene 37.5 mg-hydrochlorothiazide 25 mg daily TriCor 145 mg daily Metformin 500 mg twice daily Spironolactone 25 mg daily Ropinirole 0.5 mg 1 to 2 tablets nightly  Social History: Married former smoker-1/2 pack/day x 20 years.  Quit 1984.  2 beers a month.  Retired Musician.  Family History  Problem Relation Age of Onset  . Coronary artery disease Father   . Asthma Unknown   . Emphysema Mother   . Coronary artery disease Paternal Uncle   . Brain cancer Brother     ROS: Currently denies lightheadedness, dizziness, Fever, chills, CP, SOB.   No personal history of DVT, PE. No loose teeth or dentures.  Wears glasses.  Has decreased hearing.  Frequent urination. All other systems have been reviewed and were otherwise currently negative with the exception of those mentioned in the HPI and as above.  Objective: Vitals: Ht: 5 feet 8 inches wt: 182 temp: 97.4 BP: 175/81 pulse: 78 O2 97 % on room air.   Physical Exam: General:  Alert, NAD. Trendelenberg Gait  HEENT: EOMI, Good Neck Extension  Pulm: No increased work of breathing.  Clear B/L A/P w/o crackle or wheeze.  CV: RRR, No m/g/r appreciated  GI: soft, NT, ND Neuro: Neuro without gross focal deficit.  Sensation intact distally Skin: No lesions in the area of chief complaint MSK/Surgical Site: Right Hip pain with passive ROM.  Positive Stinchfield.  5/5 strength.   NVI.  Sensation intact distally.  Imaging Review Plain radiographs demonstrate severe degenerative joint disease of the right hip.   Preoperative templating of the joint replacement has been completed, documented, and submitted to the Operating Room personnel in order to optimize intra-operative equipment management.  Assessment: OA RIGHT HIP Principal Problem:   Primary osteoarthritis of right hip Active Problems:   Type 2 diabetes mellitus (HCC)   Hyperlipidemia   TIA (transient ischemic attack)   Essential hypertension   CVA (cerebral vascular accident) ([place])   Plan: Plan for Procedure(s): TOTAL HIP ARTHROPLASTY ANTERIOR APPROACH  The patient history, physical exam, clinical judgement of the provider and imaging are consistent with end stage degenerative joint disease and total joint arthroplasty is deemed medically necessary. The treatment options including medical management, injection therapy, and arthroplasty were discussed at length. The risks and benefits of Procedure(s): TOTAL HIP ARTHROPLASTY ANTERIOR APPROACH were presented and reviewed.  The risks of nonoperative treatment, versus surgical intervention including but not limited to continued pain, aseptic loosening, stiffness, dislocation/subluxation, infection, bleeding, nerve injury, blood clots, cardiopulmonary complications, morbidity, mortality, among others were discussed. The patient verbalizes understanding and wishes to proceed with the plan.  Patient is being admitted for surgery, pain control, PT, prophylactic antibiotics, VTE prophylaxis, progressive ambulation, ADL's and discharge planning.   Dental prophylaxis discussed and recommended for 2 years postoperatively.   The patient does meet the criteria for TXA which will be used perioperatively.    ASA 81 mg BID for DVT prophylaxis in addition to SCDs, and early ambulation.  Hold ASA / Plavix 7 days prior per Cardiology/Neurology.  Resume Plavix ASAP  postoperatively.  Plan for Tylenol only for Pain.  Baclofen for spasm.  Avoid NSAIDs dt heart / TIA hx.  The patient is planning to be discharged home with HHPT ([facility]) in care of his wife.  Anticipated LOS less than 2 midnights.  Insurance approval due to - Age 7 and older with one or more of the following:  - Expected need for hospital services (PT, OT, Nursing) required for safe  discharge  - Anticipated need for postoperative skilled nursing care or inpatient rehab  - Active co-morbidities: Diabetes and Coronary Artery Disease   Prudencio Burly III, PA-C 07/11/2018 2:24 PM

## 2018-07-11 DIAGNOSIS — M1611 Unilateral primary osteoarthritis, right hip: Secondary | ICD-10-CM | POA: Diagnosis not present

## 2018-07-12 DIAGNOSIS — I080 Rheumatic disorders of both mitral and aortic valves: Secondary | ICD-10-CM | POA: Diagnosis not present

## 2018-07-12 DIAGNOSIS — I517 Cardiomegaly: Secondary | ICD-10-CM | POA: Diagnosis not present

## 2018-07-18 DIAGNOSIS — R69 Illness, unspecified: Secondary | ICD-10-CM | POA: Diagnosis not present

## 2018-07-20 ENCOUNTER — Other Ambulatory Visit (HOSPITAL_COMMUNITY): Payer: Self-pay | Admitting: *Deleted

## 2018-07-20 NOTE — Progress Notes (Signed)
ECHO 12-04-16 EPIC

## 2018-07-20 NOTE — Patient Instructions (Addendum)
YOU NEED TO HAVE A COVID 19 TEST ON 07-29-18  @ 8:05 AM, THIS TEST MUST BE DONE BEFORE SURGERY AT [facility] . ONCE YOUR COVID TEST IS COMPLETED, PLEASE BEGIN THE QUARANTINE INSTRUCTIONS AS OUTLINED IN YOUR HANDOUT.                [place]    Your procedure is scheduled on: 08-02-2018   Report to [facility] Main  Entrance    Report to Admitting at 8:00 AM    Call this number if you have problems the morning of surgery [phone]    Remember:    NO SOLID FOOD AFTER MIDNIGHT THE NIGHT PRIOR TO SURGERY. NOTHING BY MOUTH EXCEPT CLEAR LIQUIDS UNTIL 730 AM. PLEASE FINISH G2 DRINK PER SURGEON ORDER 3 HOURS PRIOR TO SCHEDULED SURGERY TIME WHICH NEEDS TO BE COMPLETED AT 730 AM.   CLEAR LIQUID DIET   Foods Allowed                                                                     Foods Excluded  Coffee and tea, regular and decaf                             liquids that you cannot  Plain Jell-O in any flavor                                             see through such as: Fruit ices (not with fruit pulp)                                     milk, soups, orange juice  Iced Popsicles                                    All solid food Carbonated beverages, regular and diet                                    Cranberry, grape and apple juices Sports drinks like Gatorade Lightly seasoned clear broth or consume(fat free) Sugar, honey syrup  Sample Menu Breakfast                                Lunch                                     Supper Cranberry juice                    Beef broth                            Chicken broth Jell-O  Grape juice                           Apple juice Coffee or tea                        Jell-O                                      Popsicle                                                Coffee or tea                        Coffee or  tea  _____________________________________________________________________    Take these medicines the morning of surgery with A SIP OF WATER: Cetirizine (Zyrtec). Escitalopram (Lexapro), Isosorbide Mononitrate (Imdur), Levothyroxine (Synthroid), and Metoprolol Succinate  BRUSH YOUR TEETH MORNING OF SURGERY AND RINSE YOUR MOUTH OUT, NO CHEWING GUM CANDY OR MINTS.                         You may not have any metal on your body including hair pins and              piercings     Do not wear jewelry, cologne, lotions, powders or deodorant                         Men may shave face and neck.   Do not bring valuables to the hospital. [place].  Contacts, dentures or bridgework may not be worn into surgery.       _____________________________________________________________________  DO NOT TAKE ANY DIABETIC MEDICATIONS DAY OF YOUR SURGERY     How to Manage Your Diabetes Before and After Surgery  Why is it important to control my blood sugar before and after surgery? . Improving blood sugar levels before and after surgery helps healing and can limit problems. . A way of improving blood sugar control is eating a healthy diet by: o  Eating less sugar and carbohydrates o  Increasing activity/exercise o  Talking with your doctor about reaching your blood sugar goals . High blood sugars (greater than 180 mg/dL) can raise your risk of infections and slow your recovery, so you will need to focus on controlling your diabetes during the weeks before surgery. . Make sure that the doctor who takes care of your diabetes knows about your planned surgery including the date and location.  How do I manage my blood sugar before surgery? . Check your blood sugar at least 4 times a day, starting 2 days before surgery, to make sure that the level is not too high or low. o Check your blood sugar the morning of your surgery when you wake up and every 2  hours until you get to the Short Stay unit. . If your blood sugar is less than 70 mg/dL, you will need to treat for low blood sugar: o Do not take insulin. o Treat a low blood  sugar (less than 70 mg/dL) with  cup of clear juice (cranberry or apple), 4 glucose tablets, OR glucose gel. o Recheck blood sugar in 15 minutes after treatment (to make sure it is greater than 70 mg/dL). If your blood sugar is not greater than 70 mg/dL on recheck, call [phone] for further instructions. . Report your blood sugar to the short stay nurse when you get to Short Stay.  . If you are admitted to the hospital after surgery: o Your blood sugar will be checked by the staff and you will probably be given insulin after surgery (instead of oral diabetes medicines) to make sure you have good blood sugar levels. o The goal for blood sugar control after surgery is 80-180 mg/dL.   WHAT DO I DO ABOUT MY DIABETES MEDICATION?  Marland Kitchen Do not take oral diabetes medicines (pills) the morning of surgery.  . THE DAY  BEFORE SURGERY TAKE METFORMIN AS USUAL.    THE MORNING OF SURGERY DO NOT TAKE METFORMIN.   Reviewed and Endorsed by [facility] Patient Education Committee, August 2015                [facility] - Preparing for Surgery Before surgery, you can play an important role.  Because skin is not sterile, your skin needs to be as free of germs as possible.  You can reduce the number of germs on your skin by washing with CHG (chlorahexidine gluconate) soap before surgery.  CHG is an antiseptic cleaner which kills germs and bonds with the skin to continue killing germs even after washing. Please DO NOT use if you have an allergy to CHG or antibacterial soaps.  If your skin becomes reddened/irritated stop using the CHG and inform your nurse when you arrive at Short Stay. Do not shave (including legs and underarms) for at least 48 hours prior to the first CHG shower.  You may shave your face/neck. Please follow these  instructions carefully:  1.  Shower with CHG Soap the night before surgery and the  morning of Surgery.  2.  If you choose to wash your hair, wash your hair first as usual with your  normal  shampoo.  3.  After you shampoo, rinse your hair and body thoroughly to remove the  shampoo.                           4.  Use CHG as you would any other liquid soap.  You can apply chg directly  to the skin and wash                       Gently with a scrungie or clean washcloth.  5.  Apply the CHG Soap to your body ONLY FROM THE NECK DOWN.   Do not use on face/ open                           Wound or open sores. Avoid contact with eyes, ears mouth and genitals (private parts).                       Wash face,  Genitals (private parts) with your normal soap.             6.  Wash thoroughly, paying special attention to the area where your surgery  will be performed.  7.  Thoroughly rinse your  body with warm water from the neck down.  8.  DO NOT shower/wash with your normal soap after using and rinsing off  the CHG Soap.                9.  Pat yourself dry with a clean towel.            10.  Wear clean pajamas.            11.  Place clean sheets on your bed the night of your first shower and do not  sleep with pets. Day of Surgery : Do not apply any lotions/deodorants the morning of surgery.  Please wear clean clothes to the hospital/surgery center.  FAILURE TO FOLLOW THESE INSTRUCTIONS MAY RESULT IN THE CANCELLATION OF YOUR SURGERY PATIENT SIGNATURE_________________________________  NURSE SIGNATURE__________________________________  ________________________________________________________________________   Steve Ramirez  An incentive spirometer is a tool that can help keep your lungs clear and active. This tool measures how well you are filling your lungs with each breath. Taking long deep breaths may help reverse or decrease the chance of developing breathing (pulmonary) problems (especially  infection) following:  A long period of time when you are unable to move or be active. BEFORE THE PROCEDURE   If the spirometer includes an indicator to show your best effort, your nurse or respiratory therapist will set it to a desired goal.  If possible, sit up straight or lean slightly forward. Try not to slouch.  Hold the incentive spirometer in an upright position. INSTRUCTIONS FOR USE  1. Sit on the edge of your bed if possible, or sit up as far as you can in bed or on a chair. 2. Hold the incentive spirometer in an upright position. 3. Breathe out normally. 4. Place the mouthpiece in your mouth and seal your lips tightly around it. 5. Breathe in slowly and as deeply as possible, raising the piston or the ball toward the top of the column. 6. Hold your breath for 3-5 seconds or for as long as possible. Allow the piston or ball to fall to the bottom of the column. 7. Remove the mouthpiece from your mouth and breathe out normally. 8. Rest for a few seconds and repeat Steps 1 through 7 at least 10 times every 1-2 hours when you are awake. Take your time and take a few normal breaths between deep breaths. 9. The spirometer may include an indicator to show your best effort. Use the indicator as a goal to work toward during each repetition. 10. After each set of 10 deep breaths, practice coughing to be sure your lungs are clear. If you have an incision (the cut made at the time of surgery), support your incision when coughing by placing a pillow or rolled up towels firmly against it. Once you are able to get out of bed, walk around indoors and cough well. You may stop using the incentive spirometer when instructed by your caregiver.  RISKS AND COMPLICATIONS  Take your time so you do not get dizzy or light-headed.  If you are in pain, you may need to take or ask for pain medication before doing incentive spirometry. It is harder to take a deep breath if you are having pain. AFTER  USE  Rest and breathe slowly and easily.  It can be helpful to keep track of a log of your progress. Your caregiver can provide you with a simple table to help with this. If you are using the spirometer at home,  follow these instructions: [place] IF:   You are having difficultly using the spirometer.  You have trouble using the spirometer as often as instructed.  Your pain medication is not giving enough relief while using the spirometer.  You develop fever of 100.5 F (38.1 C) or higher. SEEK IMMEDIATE MEDICAL CARE IF:   You cough up bloody sputum that had not been present before.  You develop fever of 102 F (38.9 C) or greater.  You develop worsening pain at or near the incision site. MAKE SURE YOU:   Understand these instructions.  Will watch your condition.  Will get help right away if you are not doing well or get worse. Document Released: 05/11/2006 Document Revised: 03/23/2011 Document Reviewed: 07/12/2006 [facility] Patient Information 2014 [place], [place].   ________________________________________________________________________

## 2018-07-22 ENCOUNTER — Other Ambulatory Visit: Payer: Self-pay

## 2018-07-22 ENCOUNTER — Encounter (HOSPITAL_COMMUNITY): Payer: Self-pay

## 2018-07-22 ENCOUNTER — Encounter (HOSPITAL_COMMUNITY)
Admission: RE | Admit: 2018-07-22 | Discharge: 2018-07-22 | Disposition: A | Discharge status: Home / Self Care | Payer: Medicare HMO | Source: Ambulatory Visit | Attending: Orthopedic Surgery | Admitting: Orthopedic Surgery

## 2018-07-22 DIAGNOSIS — Z79899 Other long term (current) drug therapy: Secondary | ICD-10-CM | POA: Insufficient documentation

## 2018-07-22 DIAGNOSIS — I252 Old myocardial infarction: Secondary | ICD-10-CM | POA: Insufficient documentation

## 2018-07-22 DIAGNOSIS — Z7982 Long term (current) use of aspirin: Secondary | ICD-10-CM | POA: Diagnosis not present

## 2018-07-22 DIAGNOSIS — Z79890 Hormone replacement therapy (postmenopausal): Secondary | ICD-10-CM | POA: Insufficient documentation

## 2018-07-22 DIAGNOSIS — M1611 Unilateral primary osteoarthritis, right hip: Secondary | ICD-10-CM | POA: Diagnosis not present

## 2018-07-22 DIAGNOSIS — Z01818 Encounter for other preprocedural examination: Secondary | ICD-10-CM | POA: Insufficient documentation

## 2018-07-22 DIAGNOSIS — I10 Essential (primary) hypertension: Secondary | ICD-10-CM | POA: Diagnosis not present

## 2018-07-22 DIAGNOSIS — I2510 Atherosclerotic heart disease of native coronary artery without angina pectoris: Secondary | ICD-10-CM | POA: Insufficient documentation

## 2018-07-22 DIAGNOSIS — Z7902 Long term (current) use of antithrombotics/antiplatelets: Secondary | ICD-10-CM | POA: Insufficient documentation

## 2018-07-22 DIAGNOSIS — Z8673 Personal history of transient ischemic attack (TIA), and cerebral infarction without residual deficits: Secondary | ICD-10-CM | POA: Insufficient documentation

## 2018-07-22 DIAGNOSIS — E119 Type 2 diabetes mellitus without complications: Secondary | ICD-10-CM | POA: Diagnosis not present

## 2018-07-22 DIAGNOSIS — Z7984 Long term (current) use of oral hypoglycemic drugs: Secondary | ICD-10-CM | POA: Diagnosis not present

## 2018-07-22 DIAGNOSIS — Z85820 Personal history of malignant melanoma of skin: Secondary | ICD-10-CM | POA: Insufficient documentation

## 2018-07-22 LAB — BASIC METABOLIC PANEL
Anion gap: 8 (ref 5–15)
BUN: 20 mg/dL (ref 8–23)
CO2: 23 mmol/L (ref 22–32)
Calcium: 9.2 mg/dL (ref 8.9–10.3)
Chloride: 109 mmol/L (ref 98–111)
Creatinine, Ser: 0.94 mg/dL (ref 0.61–1.24)
GFR calc Af Amer: >60 mL/min (ref >60)
GFR calc non Af Amer: >60 mL/min (ref >60)
Glucose, Bld: 144 mg/dL — ABNORMAL HIGH (ref 70–99)
Potassium: 3.9 mmol/L (ref 3.5–5.1)
Sodium: 140 mmol/L (ref 135–145)

## 2018-07-22 LAB — CBC
HCT: 37.1 % — ABNORMAL LOW (ref 39.0–52.0)
Hemoglobin: 11.4 g/dL — ABNORMAL LOW (ref 13.0–17.0)
MCH: 26.1 pg (ref 26.0–34.0)
MCHC: 30.7 g/dL (ref 30.0–36.0)
MCV: 85.1 fL (ref 80.0–100.0)
Platelets: 312 10*3/uL (ref 150–400)
RBC: 4.36 MIL/uL (ref 4.22–5.81)
RDW: 14.7 % (ref 11.5–15.5)
WBC: 4.7 10*3/uL (ref 4.0–10.5)
nRBC: 0 % (ref 0.0–0.2)

## 2018-07-22 LAB — SURGICAL PCR SCREEN
MRSA, PCR: NEGATIVE
Staphylococcus aureus: POSITIVE — AB

## 2018-07-22 LAB — GLUCOSE, CAPILLARY: Glucose-Capillary: 149 mg/dL — ABNORMAL HIGH (ref 70–99)

## 2018-07-22 NOTE — Progress Notes (Signed)
06-23-18 EKG on chart  06-28-18 Surgical Clearance from Dr. Hilma Favors and [facility] on chart

## 2018-07-25 NOTE — Progress Notes (Signed)
07-22-18 PCR result routed to Dr. Percell Miller for review

## 2018-07-25 NOTE — Progress Notes (Signed)
Anesthesia Chart Review:   Case: [id number] Date/Time: 08/02/18 1016   Procedure: TOTAL HIP ARTHROPLASTY ANTERIOR APPROACH (Right )   Anesthesia type: Choice   Pre-op diagnosis: OA RIGHT HIP   Location: [facility] ROOM 08 / [facility]   Surgeon: Renette Butters, MD      DISCUSSION:  Pt is an 83 year old male with hx CAD (NSTEMI 2012- medically managed), HTN, stroke, TIA, DM  Takes plavix for hx CVA.  Pt to hold plavix 7 days before surgery.   - Has primary care and cardiology clearance for surgery.    VS: BP 140/72 (BP Location: Right Arm)   Pulse 60   Temp 36.9 C (Oral)   Resp 18   Ht 5\' 8"  (1.727 m)   Wt 87.2 kg   SpO2 97%   BMI 29.23 kg/m    PROVIDERS: - PCP is Sharilyn Sites, MD who cleared pt for surgery - Pt sees Caren Macadam, MD for primary care.   - Cardiologist is Celine Ahr, MD who cleared pt for surgery at last office visit 06/23/18 (notes in care everywhere)   LABS: Labs reviewed: Acceptable for surgery. (all labs ordered are listed, but only abnormal results are displayed)  Labs Reviewed  SURGICAL PCR SCREEN - Abnormal; Notable for the following components:      Result Value   Staphylococcus aureus POSITIVE (*)    All other components within normal limits  BASIC METABOLIC PANEL - Abnormal; Notable for the following components:   Glucose, Bld 144 (*)    All other components within normal limits  CBC - Abnormal; Notable for the following components:   Hemoglobin 11.4 (*)    HCT 37.1 (*)    All other components within normal limits  GLUCOSE, CAPILLARY - Abnormal; Notable for the following components:   Glucose-Capillary 149 (*)    All other components within normal limits     EKG 06/23/18 ([facility]- tracing on paper chart): Sinus rhythm. Occasional ectopic beat. RBBB with LAFB. Nonspecific T abnormality.    CV:  Echo 07/12/18 ([place]):  - LV normal in size. Normal LV wall thickness. LV wall motion normal. EF 60-65% - LA mildly  dilated - Mild 1+ aortic regurgitation. Aortic valve is trileaflet.  - Mild 1+ mitral regurgitation  Nuclear stress test 07/21/16 (care everywhere):  - Abnormal cardiac perfusion exam for mild inferolateral ischemia extending towards the apex. Mildly elevated TID .Prognostically this is a intermediate risk scan.  Cardiac event monitor 07/17/16 (care everywhere):  - sinus rhythm with isolated ectopic beat  Carotid duplex 12/02/16:  1. Diffuse atherosclerotic plaque in the cervical carotids. 2. Elevated right ICA velocities in the lower end of the 50-69% stenotic range. 3. Less than 50% left ICA stenosis. 4. Antegrade flow in both vertebral arteries.  Cardiac cath 11/24/10: - LM: 30% calcified - LAD: proximal- 20%, mid-50%, distal-30%             D1- 70% small vessell             D2- 90% - Circumflex: proximal- 30%, mid- 50%, AV groove- 20%             OM1- 30%             OM2- 30% - RCA: proximal- 30%, mid- 30%, distal- 100% with Left to Right collaterals             PDA- fills well from left to right colalterals - Total RCA with good collaterals.  Diagonal has some ostial disease and intervention here would jeopardize LAD.  Favor initial trial of medical Rx and if recurrent angina consider PCI of D2   Past Medical History:  Diagnosis Date  . Coronary atherosclerosis of native coronary artery   . Essential hypertension, benign   . GIB (gastrointestinal bleeding)    Colonic diverticular bleed  . History of melanoma   . History of TIAs   . Stroke ([place])   . Type 2 diabetes mellitus ([place])     Past Surgical History:  Procedure Laterality Date  . COLONOSCOPY  08/13/2010   Procedure: COLONOSCOPY;  Surgeon: Rogene Houston, MD;  [facility];  Service: Endoscopy;  Laterality: N/A;  . KNEE SURGERY     arthroscopic  . LEFT HEART CATHETERIZATION WITH CORONARY ANGIOGRAM N/A 11/24/2010   Procedure: LEFT HEART CATHETERIZATION WITH CORONARY ANGIOGRAM;  Surgeon: Josue Hector,  MD;  Location: [facility] CATH LAB;  Service: Cardiovascular;  Laterality: N/A;  . neck fusion     years ago    MEDICATIONS: . amLODipine (NORVASC) 10 MG tablet  . aspirin EC 81 MG EC tablet  . cetirizine (ZYRTEC) 10 MG tablet  . cloNIDine (CATAPRES - DOSED IN MG/24 HR) 0.3 mg/24hr patch  . clopidogrel (PLAVIX) 75 MG tablet  . escitalopram (LEXAPRO) 10 MG tablet  . fenofibrate (TRICOR) 145 MG tablet  . isosorbide mononitrate (IMDUR) 30 MG 24 hr tablet  . levothyroxine (SYNTHROID, LEVOTHROID) 50 MCG tablet  . lisinopril (PRINIVIL,ZESTRIL) 40 MG tablet  . metFORMIN (GLUCOPHAGE) 500 MG tablet  . metoprolol succinate (TOPROL-XL) 25 MG 24 hr tablet  . metoprolol succinate (TOPROL-XL) 50 MG 24 hr tablet  . mirabegron ER (MYRBETRIQ) 50 MG TB24 tablet  . Multiple Vitamin (MULTIVITAMIN) tablet  . nitroGLYCERIN (NITROSTAT) 0.4 MG SL tablet  . Omega 3 1000 MG CAPS  . pravastatin (PRAVACHOL) 10 MG tablet  . rOPINIRole (REQUIP) 0.5 MG tablet  . spironolactone (ALDACTONE) 25 MG tablet  . triamterene-hydrochlorothiazide (MAXZIDE-25) 37.5-25 MG tablet   No current facility-administered medications for this encounter.    Pt to hold plavix 7 days before surgery.    If no changes, I anticipate pt can proceed with surgery as scheduled.   Willeen Cass, FNP-BC [facility] Short Stay Surgical Center/Anesthesiology Phone: [phone] 07/26/2018 9:11 AM

## 2018-07-26 NOTE — Anesthesia Preprocedure Evaluation (Addendum)
Anesthesia Evaluation  Patient identified by MRN, date of birth, ID band Patient awake    Reviewed: Allergy & Precautions, NPO status , Patient's Chart, lab work & pertinent test results, reviewed documented beta blocker date and time   Airway Mallampati: II  TM Distance: >3 FB Neck ROM: Full    Dental no notable dental hx. (+) Caps, Teeth Intact   Pulmonary former smoker,    Pulmonary exam normal breath sounds clear to auscultation       Cardiovascular hypertension, Pt. on medications and Pt. on home beta blockers + CAD  Normal cardiovascular exam Rhythm:Regular Rate:Normal     Neuro/Psych TIACVA, Residual Symptoms negative psych ROS   GI/Hepatic Neg liver ROS, Hx/o GI bleed from diverticula disease   Endo/Other  diabetes, Well Controlled, Type 2, Oral Hypoglycemic AgentsHyperlipidemia  Renal/GU negative Renal ROS  negative genitourinary   Musculoskeletal  (+) Arthritis , Osteoarthritis,  OA right hip Hx/o melanoma   Abdominal   Peds  Hematology Plavix therapy- last dose 4 days ago   Anesthesia Other Findings   Reproductive/Obstetrics                            Anesthesia Physical Anesthesia Plan  ASA: III  Anesthesia Plan: General   Post-op Pain Management:    Induction: Intravenous  PONV Risk Score and Plan: Ondansetron, Treatment may vary due to age or medical condition and Dexamethasone  Airway Management Planned: Oral ETT  Additional Equipment:   Intra-op Plan:   Post-operative Plan: Extubation in OR  Informed Consent: I have reviewed the patients History and Physical, chart, labs and discussed the procedure including the risks, benefits and alternatives for the proposed anesthesia with the patient or authorized representative who has indicated his/her understanding and acceptance.     Dental advisory given  Plan Discussed with: CRNA and Surgeon  Anesthesia Plan  Comments: (See APP note by Willeen Cass, FNP)       Anesthesia Quick Evaluation

## 2018-07-29 ENCOUNTER — Other Ambulatory Visit: Payer: Self-pay

## 2018-07-29 ENCOUNTER — Other Ambulatory Visit (HOSPITAL_COMMUNITY)
Admission: RE | Admit: 2018-07-29 | Discharge: 2018-07-29 | Disposition: A | Discharge status: Home / Self Care | Payer: Medicare HMO | Source: Ambulatory Visit | Attending: Orthopedic Surgery | Admitting: Orthopedic Surgery

## 2018-07-29 DIAGNOSIS — Z1159 Encounter for screening for other viral diseases: Secondary | ICD-10-CM | POA: Diagnosis not present

## 2018-07-29 LAB — SARS CORONAVIRUS 2 (TAT 6-24 HRS): SARS Coronavirus 2: NEGATIVE

## 2018-08-01 MED ORDER — BUPIVACAINE LIPOSOME 1.3 % IJ SUSP
10.0000 mL | Freq: Once | INTRAMUSCULAR | Status: DC
  Filled 2018-08-01: qty 10

## 2018-08-02 ENCOUNTER — Inpatient Hospital Stay (HOSPITAL_COMMUNITY): Payer: Medicare HMO | Admitting: Anesthesiology

## 2018-08-02 ENCOUNTER — Inpatient Hospital Stay (HOSPITAL_COMMUNITY): Payer: Medicare HMO

## 2018-08-02 ENCOUNTER — Inpatient Hospital Stay (HOSPITAL_COMMUNITY): Payer: Medicare HMO | Admitting: Physician Assistant

## 2018-08-02 ENCOUNTER — Other Ambulatory Visit: Payer: Self-pay

## 2018-08-02 ENCOUNTER — Encounter (HOSPITAL_COMMUNITY)
Admission: RE | Disposition: A | Discharge status: Home / Self Care | Payer: Self-pay | Source: Home / Self Care | Attending: Orthopedic Surgery

## 2018-08-02 ENCOUNTER — Inpatient Hospital Stay (HOSPITAL_COMMUNITY)
Admission: RE | Admit: 2018-08-02 | Discharge: 2018-08-03 | DRG: 470 | Disposition: A | Discharge status: Home / Self Care | Payer: Medicare HMO | Attending: Orthopedic Surgery | Admitting: Orthopedic Surgery

## 2018-08-02 ENCOUNTER — Encounter (HOSPITAL_COMMUNITY): Payer: Self-pay | Admitting: *Deleted

## 2018-08-02 DIAGNOSIS — Z79890 Hormone replacement therapy (postmenopausal): Secondary | ICD-10-CM | POA: Diagnosis not present

## 2018-08-02 DIAGNOSIS — Z9889 Other specified postprocedural states: Secondary | ICD-10-CM

## 2018-08-02 DIAGNOSIS — Z85820 Personal history of malignant melanoma of skin: Secondary | ICD-10-CM

## 2018-08-02 DIAGNOSIS — Z7984 Long term (current) use of oral hypoglycemic drugs: Secondary | ICD-10-CM

## 2018-08-02 DIAGNOSIS — Z96649 Presence of unspecified artificial hip joint: Secondary | ICD-10-CM

## 2018-08-02 DIAGNOSIS — Z8249 Family history of ischemic heart disease and other diseases of the circulatory system: Secondary | ICD-10-CM

## 2018-08-02 DIAGNOSIS — Z79899 Other long term (current) drug therapy: Secondary | ICD-10-CM

## 2018-08-02 DIAGNOSIS — E1140 Type 2 diabetes mellitus with diabetic neuropathy, unspecified: Secondary | ICD-10-CM

## 2018-08-02 DIAGNOSIS — Z96642 Presence of left artificial hip joint: Secondary | ICD-10-CM

## 2018-08-02 DIAGNOSIS — Z8673 Personal history of transient ischemic attack (TIA), and cerebral infarction without residual deficits: Secondary | ICD-10-CM | POA: Diagnosis not present

## 2018-08-02 DIAGNOSIS — I252 Old myocardial infarction: Secondary | ICD-10-CM | POA: Diagnosis not present

## 2018-08-02 DIAGNOSIS — M25551 Pain in right hip: Secondary | ICD-10-CM | POA: Diagnosis present

## 2018-08-02 DIAGNOSIS — Z419 Encounter for procedure for purposes other than remedying health state, unspecified: Secondary | ICD-10-CM

## 2018-08-02 DIAGNOSIS — I2510 Atherosclerotic heart disease of native coronary artery without angina pectoris: Secondary | ICD-10-CM | POA: Diagnosis not present

## 2018-08-02 DIAGNOSIS — M1610 Unilateral primary osteoarthritis, unspecified hip: Secondary | ICD-10-CM | POA: Diagnosis present

## 2018-08-02 DIAGNOSIS — Z87891 Personal history of nicotine dependence: Secondary | ICD-10-CM

## 2018-08-02 DIAGNOSIS — M1611 Unilateral primary osteoarthritis, right hip: Secondary | ICD-10-CM | POA: Diagnosis not present

## 2018-08-02 DIAGNOSIS — I10 Essential (primary) hypertension: Secondary | ICD-10-CM | POA: Diagnosis present

## 2018-08-02 DIAGNOSIS — I639 Cerebral infarction, unspecified: Secondary | ICD-10-CM | POA: Diagnosis present

## 2018-08-02 DIAGNOSIS — Z7982 Long term (current) use of aspirin: Secondary | ICD-10-CM

## 2018-08-02 DIAGNOSIS — E785 Hyperlipidemia, unspecified: Secondary | ICD-10-CM | POA: Diagnosis present

## 2018-08-02 DIAGNOSIS — Z96641 Presence of right artificial hip joint: Secondary | ICD-10-CM | POA: Diagnosis not present

## 2018-08-02 DIAGNOSIS — G459 Transient cerebral ischemic attack, unspecified: Secondary | ICD-10-CM | POA: Diagnosis present

## 2018-08-02 DIAGNOSIS — E119 Type 2 diabetes mellitus without complications: Secondary | ICD-10-CM | POA: Diagnosis not present

## 2018-08-02 DIAGNOSIS — Z471 Aftercare following joint replacement surgery: Secondary | ICD-10-CM | POA: Diagnosis not present

## 2018-08-02 DIAGNOSIS — Z7902 Long term (current) use of antithrombotics/antiplatelets: Secondary | ICD-10-CM

## 2018-08-02 DIAGNOSIS — E782 Mixed hyperlipidemia: Secondary | ICD-10-CM

## 2018-08-02 HISTORY — PX: TOTAL HIP ARTHROPLASTY: SHX124

## 2018-08-02 LAB — GLUCOSE, CAPILLARY
Glucose-Capillary: 122 mg/dL — ABNORMAL HIGH (ref 70–99)
Glucose-Capillary: 171 mg/dL — ABNORMAL HIGH (ref 70–99)
Glucose-Capillary: 169 mg/dL — ABNORMAL HIGH (ref 70–99)
Glucose-Capillary: 174 mg/dL — ABNORMAL HIGH (ref 70–99)

## 2018-08-02 SURGERY — ARTHROPLASTY, HIP, TOTAL, ANTERIOR APPROACH
Anesthesia: General | Site: Hip | Laterality: Right

## 2018-08-02 MED ORDER — DEXAMETHASONE SODIUM PHOSPHATE 10 MG/ML IJ SOLN
INTRAMUSCULAR | Status: DC | PRN
  Administered 2018-08-02: 8 mg via INTRAVENOUS

## 2018-08-02 MED ORDER — LABETALOL HCL 5 MG/ML IV SOLN
INTRAVENOUS | Status: AC
  Filled 2018-08-02: qty 4

## 2018-08-02 MED ORDER — DIPHENHYDRAMINE HCL 12.5 MG/5ML PO ELIX: 12.5000 mg | ORAL_SOLUTION | ORAL | Status: DC | PRN

## 2018-08-02 MED ORDER — POLYETHYLENE GLYCOL 3350 17 G PO PACK: 17.0000 g | PACK | Freq: Every day | ORAL | Status: DC | PRN

## 2018-08-02 MED ORDER — FENTANYL CITRATE (PF) 100 MCG/2ML IJ SOLN
INTRAMUSCULAR | Status: DC | PRN
  Administered 2018-08-02 (×3): 50 ug via INTRAVENOUS
  Administered 2018-08-02: 100 ug via INTRAVENOUS
  Administered 2018-08-02: 50 ug via INTRAVENOUS

## 2018-08-02 MED ORDER — SORBITOL 70 % SOLN
30.0000 mL | Freq: Every day | Status: DC | PRN
  Filled 2018-08-02: qty 30

## 2018-08-02 MED ORDER — METOCLOPRAMIDE HCL 5 MG PO TABS: 5.0000 mg | ORAL_TABLET | Freq: Three times a day (TID) | ORAL | Status: DC | PRN

## 2018-08-02 MED ORDER — LIDOCAINE 2% (20 MG/ML) 5 ML SYRINGE
INTRAMUSCULAR | Status: DC | PRN
  Administered 2018-08-02: 50 mg via INTRAVENOUS

## 2018-08-02 MED ORDER — SUGAMMADEX SODIUM 200 MG/2ML IV SOLN
INTRAVENOUS | Status: DC | PRN
  Administered 2018-08-02: 200 mg via INTRAVENOUS

## 2018-08-02 MED ORDER — GABAPENTIN 100 MG PO CAPS: 100.0000 mg | ORAL_CAPSULE | Freq: Two times a day (BID) | ORAL | 0 refills | Status: DC

## 2018-08-02 MED ORDER — FENTANYL CITRATE (PF) 100 MCG/2ML IJ SOLN
INTRAMUSCULAR | Status: AC
  Filled 2018-08-02: qty 2

## 2018-08-02 MED ORDER — CLOPIDOGREL BISULFATE 75 MG PO TABS
75.0000 mg | ORAL_TABLET | Freq: Every day | ORAL | Status: DC
  Administered 2018-08-03: 75 mg via ORAL
  Filled 2018-08-02: qty 1

## 2018-08-02 MED ORDER — DOCUSATE SODIUM 100 MG PO CAPS: 100.0000 mg | ORAL_CAPSULE | Freq: Two times a day (BID) | ORAL | 0 refills | Status: DC

## 2018-08-02 MED ORDER — FENTANYL CITRATE (PF) 100 MCG/2ML IJ SOLN: 25.0000 ug | INTRAMUSCULAR | Status: DC | PRN

## 2018-08-02 MED ORDER — MIDAZOLAM HCL 5 MG/5ML IJ SOLN: INTRAMUSCULAR | Status: DC | PRN

## 2018-08-02 MED ORDER — ESCITALOPRAM OXALATE 10 MG PO TABS
10.0000 mg | ORAL_TABLET | Freq: Every day | ORAL | Status: DC
  Administered 2018-08-03: 10 mg via ORAL
  Filled 2018-08-02: qty 1

## 2018-08-02 MED ORDER — METHOCARBAMOL 500 MG IVPB - SIMPLE MED
500.0000 mg | Freq: Four times a day (QID) | INTRAVENOUS | Status: DC | PRN
  Filled 2018-08-02: qty 50

## 2018-08-02 MED ORDER — HYDROCODONE-ACETAMINOPHEN 5-325 MG PO TABS
1.0000 | ORAL_TABLET | ORAL | Status: DC | PRN
  Administered 2018-08-03 (×2): 1 via ORAL
  Filled 2018-08-02 (×2): qty 1

## 2018-08-02 MED ORDER — NITROGLYCERIN 0.4 MG SL SUBL: 0.4000 mg | SUBLINGUAL_TABLET | SUBLINGUAL | Status: DC | PRN

## 2018-08-02 MED ORDER — ONDANSETRON HCL 4 MG PO TABS: 4.0000 mg | ORAL_TABLET | Freq: Four times a day (QID) | ORAL | Status: DC | PRN

## 2018-08-02 MED ORDER — ONDANSETRON HCL 4 MG/2ML IJ SOLN: 4.0000 mg | Freq: Once | INTRAMUSCULAR | Status: DC | PRN

## 2018-08-02 MED ORDER — PRAVASTATIN SODIUM 20 MG PO TABS
10.0000 mg | ORAL_TABLET | Freq: Every day | ORAL | Status: DC
  Administered 2018-08-02: 10 mg via ORAL
  Filled 2018-08-02: qty 1

## 2018-08-02 MED ORDER — PHENYLEPHRINE HCL (PRESSORS) 10 MG/ML IV SOLN
INTRAVENOUS | Status: AC
  Filled 2018-08-02: qty 1

## 2018-08-02 MED ORDER — MIDAZOLAM HCL 2 MG/2ML IJ SOLN
INTRAMUSCULAR | Status: AC
  Filled 2018-08-02: qty 2

## 2018-08-02 MED ORDER — PROPOFOL 10 MG/ML IV BOLUS
INTRAVENOUS | Status: DC | PRN
  Administered 2018-08-02: 120 mg via INTRAVENOUS

## 2018-08-02 MED ORDER — ONDANSETRON HCL 4 MG/2ML IJ SOLN
INTRAMUSCULAR | Status: DC | PRN
  Administered 2018-08-02: 4 mg via INTRAVENOUS

## 2018-08-02 MED ORDER — ACETAMINOPHEN 500 MG PO TABS
1000.0000 mg | ORAL_TABLET | Freq: Once | ORAL | Status: AC
  Administered 2018-08-02: 1000 mg via ORAL
  Filled 2018-08-02: qty 2

## 2018-08-02 MED ORDER — ACETAMINOPHEN 325 MG PO TABS: 325.0000 mg | ORAL_TABLET | Freq: Four times a day (QID) | ORAL | Status: DC | PRN

## 2018-08-02 MED ORDER — CEFAZOLIN SODIUM-DEXTROSE 2-4 GM/100ML-% IV SOLN
2.0000 g | INTRAVENOUS | Status: AC
  Administered 2018-08-02: 2 g via INTRAVENOUS
  Filled 2018-08-02: qty 100

## 2018-08-02 MED ORDER — MAGNESIUM CITRATE PO SOLN: 1.0000 | Freq: Once | ORAL | Status: DC | PRN

## 2018-08-02 MED ORDER — METOPROLOL SUCCINATE ER 25 MG PO TB24
25.0000 mg | ORAL_TABLET | Freq: Every day | ORAL | Status: DC
  Administered 2018-08-03: 25 mg via ORAL
  Filled 2018-08-02: qty 1

## 2018-08-02 MED ORDER — CHLORHEXIDINE GLUCONATE 4 % EX LIQD: 60.0000 mL | Freq: Once | CUTANEOUS | Status: DC

## 2018-08-02 MED ORDER — BACLOFEN 10 MG PO TABS: 10.0000 mg | ORAL_TABLET | Freq: Three times a day (TID) | ORAL | 0 refills | Status: DC | PRN

## 2018-08-02 MED ORDER — ASPIRIN EC 81 MG PO TBEC: 81.0000 mg | DELAYED_RELEASE_TABLET | Freq: Two times a day (BID) | ORAL | 0 refills | Status: DC

## 2018-08-02 MED ORDER — LEVOTHYROXINE SODIUM 50 MCG PO TABS
50.0000 ug | ORAL_TABLET | Freq: Every day | ORAL | Status: DC
  Administered 2018-08-03: 50 ug via ORAL
  Filled 2018-08-02: qty 1

## 2018-08-02 MED ORDER — ASPIRIN 81 MG PO CHEW
81.0000 mg | CHEWABLE_TABLET | Freq: Two times a day (BID) | ORAL | Status: DC
  Administered 2018-08-02 – 2018-08-03 (×2): 81 mg via ORAL
  Filled 2018-08-02 (×2): qty 1

## 2018-08-02 MED ORDER — LIDOCAINE 2% (20 MG/ML) 5 ML SYRINGE
INTRAMUSCULAR | Status: AC
  Filled 2018-08-02: qty 5

## 2018-08-02 MED ORDER — CEFAZOLIN SODIUM-DEXTROSE 1-4 GM/50ML-% IV SOLN
1.0000 g | Freq: Four times a day (QID) | INTRAVENOUS | Status: AC
  Administered 2018-08-02 (×2): 1 g via INTRAVENOUS
  Filled 2018-08-02 (×2): qty 50

## 2018-08-02 MED ORDER — TRANEXAMIC ACID-NACL 1000-0.7 MG/100ML-% IV SOLN
1000.0000 mg | INTRAVENOUS | Status: AC
  Administered 2018-08-02: 1000 mg via INTRAVENOUS
  Filled 2018-08-02: qty 100

## 2018-08-02 MED ORDER — MENTHOL 3 MG MT LOZG: 1.0000 | LOZENGE | OROMUCOSAL | Status: DC | PRN

## 2018-08-02 MED ORDER — ONDANSETRON HCL 4 MG/2ML IJ SOLN
INTRAMUSCULAR | Status: AC
  Filled 2018-08-02: qty 2

## 2018-08-02 MED ORDER — BUPIVACAINE LIPOSOME 1.3 % IJ SUSP
INTRAMUSCULAR | Status: DC | PRN
  Administered 2018-08-02: 10 mL

## 2018-08-02 MED ORDER — METFORMIN HCL 500 MG PO TABS
500.0000 mg | ORAL_TABLET | Freq: Two times a day (BID) | ORAL | Status: DC
  Administered 2018-08-02 – 2018-08-03 (×2): 500 mg via ORAL
  Filled 2018-08-02 (×2): qty 1

## 2018-08-02 MED ORDER — SODIUM CHLORIDE FLUSH 0.9 % IV SOLN
INTRAVENOUS | Status: DC | PRN
  Administered 2018-08-02: 20 mL

## 2018-08-02 MED ORDER — METHOCARBAMOL 500 MG PO TABS
500.0000 mg | ORAL_TABLET | Freq: Four times a day (QID) | ORAL | Status: DC | PRN
  Administered 2018-08-02 – 2018-08-03 (×2): 500 mg via ORAL
  Filled 2018-08-02 (×2): qty 1

## 2018-08-02 MED ORDER — PROPOFOL 10 MG/ML IV BOLUS
INTRAVENOUS | Status: AC
  Filled 2018-08-02: qty 40

## 2018-08-02 MED ORDER — DOCUSATE SODIUM 100 MG PO CAPS
100.0000 mg | ORAL_CAPSULE | Freq: Two times a day (BID) | ORAL | Status: DC
  Administered 2018-08-02 – 2018-08-03 (×2): 100 mg via ORAL
  Filled 2018-08-02 (×2): qty 1

## 2018-08-02 MED ORDER — TRIAMTERENE-HCTZ 37.5-25 MG PO TABS
1.0000 | ORAL_TABLET | Freq: Every day | ORAL | Status: DC
  Administered 2018-08-02 – 2018-08-03 (×2): 1 via ORAL
  Filled 2018-08-02 (×2): qty 1

## 2018-08-02 MED ORDER — INSULIN ASPART 100 UNIT/ML ~~LOC~~ SOLN
0.0000 [IU] | Freq: Three times a day (TID) | SUBCUTANEOUS | Status: DC
  Administered 2018-08-02: 3 [IU] via SUBCUTANEOUS
  Administered 2018-08-03: 5 [IU] via SUBCUTANEOUS
  Administered 2018-08-03: 3 [IU] via SUBCUTANEOUS

## 2018-08-02 MED ORDER — SODIUM CHLORIDE (PF) 0.9 % IJ SOLN
INTRAMUSCULAR | Status: AC
  Filled 2018-08-02: qty 20

## 2018-08-02 MED ORDER — PHENOL 1.4 % MT LIQD
1.0000 | OROMUCOSAL | Status: DC | PRN
  Filled 2018-08-02: qty 177

## 2018-08-02 MED ORDER — PROPOFOL 10 MG/ML IV BOLUS
INTRAVENOUS | Status: AC
  Filled 2018-08-02: qty 20

## 2018-08-02 MED ORDER — LABETALOL HCL 5 MG/ML IV SOLN
INTRAVENOUS | Status: DC | PRN
  Administered 2018-08-02: 5 mg via INTRAVENOUS

## 2018-08-02 MED ORDER — DEXAMETHASONE SODIUM PHOSPHATE 10 MG/ML IJ SOLN
INTRAMUSCULAR | Status: AC
  Filled 2018-08-02: qty 1

## 2018-08-02 MED ORDER — LACTATED RINGERS IV SOLN
INTRAVENOUS | Status: DC
  Administered 2018-08-02 – 2018-08-03 (×2): via INTRAVENOUS

## 2018-08-02 MED ORDER — MEPERIDINE HCL 50 MG/ML IJ SOLN: 6.2500 mg | INTRAMUSCULAR | Status: DC | PRN

## 2018-08-02 MED ORDER — SODIUM CHLORIDE 0.9 % IV SOLN
INTRAVENOUS | Status: DC | PRN
  Administered 2018-08-02: 40 ug/min via INTRAVENOUS

## 2018-08-02 MED ORDER — EPHEDRINE SULFATE-NACL 50-0.9 MG/10ML-% IV SOSY
PREFILLED_SYRINGE | INTRAVENOUS | Status: DC | PRN
  Administered 2018-08-02: 2.5 mg via INTRAVENOUS

## 2018-08-02 MED ORDER — ACETAMINOPHEN 500 MG PO TABS
500.0000 mg | ORAL_TABLET | Freq: Four times a day (QID) | ORAL | Status: AC
  Administered 2018-08-02 – 2018-08-03 (×4): 500 mg via ORAL
  Filled 2018-08-02 (×4): qty 1

## 2018-08-02 MED ORDER — POVIDONE-IODINE 10 % EX SWAB: 2.0000 "application " | Freq: Once | CUTANEOUS | Status: DC

## 2018-08-02 MED ORDER — HYDROCODONE-ACETAMINOPHEN 5-325 MG PO TABS: 1.0000 | ORAL_TABLET | Freq: Four times a day (QID) | ORAL | 0 refills | Status: AC | PRN

## 2018-08-02 MED ORDER — DEXAMETHASONE SODIUM PHOSPHATE 10 MG/ML IJ SOLN
10.0000 mg | Freq: Once | INTRAMUSCULAR | Status: AC
  Administered 2018-08-03: 10 mg via INTRAVENOUS
  Filled 2018-08-02: qty 1

## 2018-08-02 MED ORDER — ISOSORBIDE MONONITRATE ER 30 MG PO TB24
30.0000 mg | ORAL_TABLET | Freq: Every day | ORAL | Status: DC
  Administered 2018-08-03: 30 mg via ORAL
  Filled 2018-08-02: qty 1

## 2018-08-02 MED ORDER — ROCURONIUM BROMIDE 10 MG/ML (PF) SYRINGE
PREFILLED_SYRINGE | INTRAVENOUS | Status: AC
  Filled 2018-08-02: qty 10

## 2018-08-02 MED ORDER — EPHEDRINE 5 MG/ML INJ
INTRAVENOUS | Status: AC
  Filled 2018-08-02: qty 10

## 2018-08-02 MED ORDER — ONDANSETRON HCL 4 MG PO TABS: 4.0000 mg | ORAL_TABLET | Freq: Three times a day (TID) | ORAL | 0 refills | Status: DC | PRN

## 2018-08-02 MED ORDER — LACTATED RINGERS IV SOLN
INTRAVENOUS | Status: DC
  Administered 2018-08-02 (×2): via INTRAVENOUS

## 2018-08-02 MED ORDER — METOCLOPRAMIDE HCL 5 MG/ML IJ SOLN: 5.0000 mg | Freq: Three times a day (TID) | INTRAMUSCULAR | Status: DC | PRN

## 2018-08-02 MED ORDER — ROPINIROLE HCL 0.5 MG PO TABS
0.5000 mg | ORAL_TABLET | Freq: Every day | ORAL | Status: DC
  Administered 2018-08-02: 1 mg via ORAL
  Filled 2018-08-02: qty 2

## 2018-08-02 MED ORDER — SPIRONOLACTONE 25 MG PO TABS
25.0000 mg | ORAL_TABLET | Freq: Every day | ORAL | Status: DC
  Administered 2018-08-03: 25 mg via ORAL
  Filled 2018-08-02: qty 1

## 2018-08-02 MED ORDER — LISINOPRIL 20 MG PO TABS
40.0000 mg | ORAL_TABLET | Freq: Every day | ORAL | Status: DC
  Administered 2018-08-03: 40 mg via ORAL
  Filled 2018-08-02: qty 2

## 2018-08-02 MED ORDER — ROCURONIUM BROMIDE 10 MG/ML (PF) SYRINGE
PREFILLED_SYRINGE | INTRAVENOUS | Status: DC | PRN
  Administered 2018-08-02: 60 mg via INTRAVENOUS

## 2018-08-02 MED ORDER — POVIDONE-IODINE 10 % EX SWAB
2.0000 "application " | Freq: Once | CUTANEOUS | Status: AC
  Administered 2018-08-02: 2 via TOPICAL

## 2018-08-02 MED ORDER — FENOFIBRATE 160 MG PO TABS
160.0000 mg | ORAL_TABLET | Freq: Every day | ORAL | Status: DC
  Administered 2018-08-03: 160 mg via ORAL
  Filled 2018-08-02: qty 1

## 2018-08-02 MED ORDER — LACTATED RINGERS IV SOLN
INTRAVENOUS | Status: DC
  Administered 2018-08-02: 09:00:00 via INTRAVENOUS

## 2018-08-02 MED ORDER — HYDROMORPHONE HCL 1 MG/ML IJ SOLN: 0.2500 mg | INTRAMUSCULAR | Status: DC | PRN

## 2018-08-02 MED ORDER — 0.9 % SODIUM CHLORIDE (POUR BTL) OPTIME
TOPICAL | Status: DC | PRN
  Administered 2018-08-02: 1000 mL

## 2018-08-02 MED ORDER — ONDANSETRON HCL 4 MG/2ML IJ SOLN: 4.0000 mg | Freq: Four times a day (QID) | INTRAMUSCULAR | Status: DC | PRN

## 2018-08-02 SURGICAL SUPPLY — 36 items
APL PRP STRL LF DISP 70% ISPRP (MISCELLANEOUS) ×1
BLADE SAG 18X100X1.27 (BLADE) ×1 IMPLANT
CHLORAPREP W/TINT 26 (MISCELLANEOUS) ×2 IMPLANT
CLSR STERI-STRIP ANTIMIC 1/2X4 (GAUZE/BANDAGES/DRESSINGS) ×2 IMPLANT
COVER PERINEAL POST (MISCELLANEOUS) ×2 IMPLANT
COVER SURGICAL LIGHT HANDLE (MISCELLANEOUS) ×2 IMPLANT
COVER WAND RF STERILE (DRAPES) IMPLANT
DECANTER SPIKE VIAL GLASS SM (MISCELLANEOUS) ×4 IMPLANT
DRAPE IMP U-DRAPE 54X76 (DRAPES) ×2 IMPLANT
DRAPE STERI IOBAN 125X83 (DRAPES) ×2 IMPLANT
DRAPE U-SHAPE 47X51 STRL (DRAPES) ×4 IMPLANT
DRSG MEPILEX BORDER 4X8 (GAUZE/BANDAGES/DRESSINGS) ×2 IMPLANT
ELECT BLADE TIP CTD 4 INCH (ELECTRODE) ×2 IMPLANT
GLOVE BIO SURGEON STRL SZ7.5 (GLOVE) ×4 IMPLANT
GLOVE BIOGEL PI IND STRL 8 (GLOVE) ×2 IMPLANT
GLOVE BIOGEL PI INDICATOR 8 (GLOVE) ×2
GOWN STRL REUS W/TWL LRG LVL3 (GOWN DISPOSABLE) ×2 IMPLANT
GOWN STRL REUS W/TWL XL LVL3 (GOWN DISPOSABLE) ×2 IMPLANT
HEAD BIOLOX HIP 36/+2.5 (Joint) IMPLANT
HIP BIOLOX HD 36/+2.5 (Joint) ×2 IMPLANT
INSERT TRIDENT POLY 36MM 0DEG (Insert) ×1 IMPLANT
KIT TURNOVER KIT A (KITS) IMPLANT
MANIFOLD NEPTUNE II (INSTRUMENTS) ×2 IMPLANT
PACK ANTERIOR HIP CUSTOM (KITS) ×2 IMPLANT
SCREW HEX LP 6.5X20 (Screw) ×1 IMPLANT
SHELL CLUSTERHOLE ACETABULAR 5 (Shell) ×1 IMPLANT
STEM ACCOLADE SZ 6 (Hips) ×1 IMPLANT
SUT MNCRL AB 4-0 PS2 18 (SUTURE) ×2 IMPLANT
SUT STRATAFIX 0 PDS 27 VIOLET (SUTURE) ×2
SUT VIC AB 0 CT1 36 (SUTURE) ×2 IMPLANT
SUT VIC AB 1 CT1 36 (SUTURE) ×2 IMPLANT
SUT VIC AB 2-0 CT1 27 (SUTURE) ×4
SUT VIC AB 2-0 CT1 TAPERPNT 27 (SUTURE) ×2 IMPLANT
SUTURE STRATFX 0 PDS 27 VIOLET (SUTURE) ×1 IMPLANT
WATER STERILE IRR 1000ML POUR (IV SOLUTION) ×4 IMPLANT
YANKAUER SUCT BULB TIP 10FT TU (MISCELLANEOUS) ×2 IMPLANT

## 2018-08-02 NOTE — Anesthesia Procedure Notes (Signed)
Procedure Name: Intubation Date/Time: 08/02/2018 9:42 AM Performed by: Anne Fu, CRNA Pre-anesthesia Checklist: Patient identified, Emergency Drugs available, Suction available, Patient being monitored and Timeout performed Patient Re-evaluated:Patient Re-evaluated prior to induction Oxygen Delivery Method: Circle system utilized Preoxygenation: Pre-oxygenation with 100% oxygen Induction Type: IV induction Ventilation: Mask ventilation without difficulty Laryngoscope Size: Mac and 4 Grade View: Grade I Tube type: Oral Tube size: 7.5 mm Number of attempts: 1 Airway Equipment and Method: Stylet Placement Confirmation: ETT inserted through vocal cords under direct vision,  positive ETCO2 and breath sounds checked- equal and bilateral Secured at: 23 cm Tube secured with: Tape Dental Injury: Teeth and Oropharynx as per pre-operative assessment

## 2018-08-02 NOTE — Interval H&P Note (Signed)
I participated in the care of this patient and agree with the above history, physical and evaluation. I performed a review of the history and a physical exam as detailed   Timothy Daniel Murphy MD  

## 2018-08-02 NOTE — Anesthesia Postprocedure Evaluation (Signed)
Anesthesia Post Note  Patient: Steve Ramirez  Procedure(s) Performed: TOTAL HIP ARTHROPLASTY ANTERIOR APPROACH (Right Hip)     Patient location during evaluation: PACU Anesthesia Type: General Level of consciousness: awake and alert and oriented Pain management: pain level controlled Vital Signs Assessment: post-procedure vital signs reviewed and stable Respiratory status: nonlabored ventilation, respiratory function stable and spontaneous breathing Cardiovascular status: blood pressure returned to baseline and stable Postop Assessment: no apparent nausea or vomiting Anesthetic complications: no    Last Vitals:  Vitals:   08/02/18 1245 08/02/18 1300  BP: (!) 115/57 137/66  Pulse: 62 63  Resp: 13 11  Temp:    SpO2: 97% 97%    Last Pain:  Vitals:   08/02/18 1300  TempSrc:   PainSc: 0-No pain                 Burnard Enis A.

## 2018-08-02 NOTE — Discharge Instructions (Signed)

## 2018-08-02 NOTE — Transfer of Care (Signed)
Immediate Anesthesia Transfer of Care Note  Patient: Steve Ramirez  Procedure(s) Performed: Procedure(s): TOTAL HIP ARTHROPLASTY ANTERIOR APPROACH (Right)  Patient Location: PACU  Anesthesia Type:General  Level of Consciousness:  sedated, patient cooperative and responds to stimulation  Airway & Oxygen Therapy:Patient Spontanous Breathing and Patient connected to face mask oxgen  Post-op Assessment:  Report given to PACU RN and Post -op Vital signs reviewed and stable  Post vital signs:  Reviewed and stable  Last Vitals:  Vitals:   08/02/18 0843 08/02/18 0901  BP: (!) 190/90 (!) 161/82  Pulse: 65   Resp: 18   Temp: 36.8 C   SpO2: 90%     Complications: No apparent anesthesia complications

## 2018-08-02 NOTE — Op Note (Signed)
08/02/2018  10:54 AM  PATIENT:  Steve Ramirez   MRN: [id number]  PRE-OPERATIVE DIAGNOSIS:  OSTEOARTHRITIS RIGHT HIP  POST-OPERATIVE DIAGNOSIS:  OSTEOARTHRITIS RIGHT HIP  PROCEDURE:  Procedure(s): TOTAL HIP ARTHROPLASTY ANTERIOR APPROACH  PREOPERATIVE INDICATIONS:    MERRELL BORSUK is an 83 y.o. male who has a diagnosis of Primary osteoarthritis of right hip and elected for surgical management after failing conservative treatment.  The risks benefits and alternatives were discussed with the patient including but not limited to the risks of nonoperative treatment, versus surgical intervention including infection, bleeding, nerve injury, periprosthetic fracture, the need for revision surgery, dislocation, leg length discrepancy, blood clots, cardiopulmonary complications, morbidity, mortality, among others, and they were willing to proceed.     OPERATIVE REPORT     SURGEON:   Renette Butters, MD    ASSISTANT:  Roxan Hockey, PA-C, he was present and scrubbed throughout the case, critical for completion in a timely fashion, and for retraction, instrumentation, and closure.     ANESTHESIA:  General    COMPLICATIONS:  None.     COMPONENTS:  [employer]ryker acolade fit femur size 6 with a 36 mm +2.5 head ball and a PSL acetabular shell size 52 with a  polyethylene liner    PROCEDURE IN DETAIL:   The patient was met in the holding area and  identified.  The appropriate hip was identified and marked at the operative site.  The patient was then transported to the OR  and  placed under anesthesia per that record.  At that point, the patient was  placed in the supine position and  secured to the operating room table and all bony prominences padded. He received pre-operative antibiotics    The operative lower extremity was prepped from the iliac crest to the distal leg.  Sterile draping was performed.  Time out was performed prior to incision.      Skin incision was made just 2 cm  lateral to the ASIS  extending in line with the tensor fascia lata. Electrocautery was used to control all bleeders. I dissected down sharply to the fascia of the tensor fascia lata was confirmed that the muscle fibers beneath were running posteriorly. I then incised the fascia over the superficial tensor fascia lata in line with the incision. The fascia was elevated off the anterior aspect of the muscle the muscle was retracted posteriorly and protected throughout the case. I then used electrocautery to incise the tensor fascia lata fascia control and all bleeders. Immediately visible was the fat over top of the anterior neck and capsule.  I removed the anterior fat from the capsule and elevated the rectus muscle off of the anterior capsule. I then removed a large time of capsule. The retractors were then placed over the anterior acetabulum as well as around the superior and inferior neck.  I then made a femoral neck cut. Then used the power corkscrew to remove the femoral head from the acetabulum and thoroughly irrigated the acetabulum. I sized the femoral head.    I then exposed the deep acetabulum, cleared out any tissue including the ligamentum teres.   After adequate visualization, I excised the labrum, and then sequentially reamed.  I then impacted the acetabular implant into place using fluoroscopy for guidance.  Appropriate version and inclination was confirmed clinically matching their bony anatomy, and with fluoroscopy.  I placed a 20 mm screw in the posterior/superio position with an excellent bite.    I then placed the polyethylene  liner in place  I then adducted the leg and released the external rotators from the posterior femur allowing it to be easily delivered up lateral and anterior to the acetabulum for preparation of the femoral canal.    I then prepared the proximal femur using the cookie-cutter and then sequentially reamed and broached.  A trial broach, neck, and head was  utilized, and I reduced the hip and used floroscopy to assess the neck length and femoral implant.  I then impacted the femoral prosthesis into place into the appropriate version. The hip was then reduced and fluoroscopy confirmed appropriate position. Leg lengths were restored.  I then irrigated the hip copiously again with, and repaired the fascia with Vicryl, followed by monocryl for the subcutaneous tissue, Monocryl for the skin, Steri-Strips and sterile gauze. The patient was then awakened and returned to PACU in stable and satisfactory condition. There were no complications.  POST OPERATIVE PLAN: WBAT, DVT px: SCD's/TED, ambulation and chemical dvt px  [name], MD Orthopedic Surgeon [phone]

## 2018-08-02 NOTE — Evaluation (Signed)
Physical Therapy Evaluation Patient Details Name: Steve Ramirez MRN: [id number] DOB: 1935/01/23 Today's Date: 08/02/2018   History of Present Illness  Pt is an 83 y/o male admitted secondary to R THA anterior approach on 08/02/18. PMH including but not limited to CAD, NSTEMI, HTN, DM, TIA, and melanoma. Pt reports h/o CVA x 2 with RLE deficits, and h/o falls.   Clinical Impression  Pt is s/p THA resulting in the deficits listed below (see PT Problem List). Pt ambulated 37' with RW, initiated THA HEP. Good progress expected.  Pt will benefit from skilled PT to increase their independence and safety with mobility to allow discharge to the venue listed below.      Follow Up Recommendations Follow surgeon's recommendation for DC plan and follow-up therapies    Equipment Recommendations  3in1 (PT)    Recommendations for Other Services       Precautions / Restrictions Precautions Precautions: Fall Precaution Comments: pt reports h/o multiple falls 2* RLE issues from 2 prior CVAs Restrictions Weight Bearing Restrictions: No Other Position/Activity Restrictions: WBAT      Mobility  Bed Mobility Overal bed mobility: Needs Assistance Bed Mobility: Supine to Sit     Supine to sit: Min assist;HOB elevated     General bed mobility comments: HOB up, Mod A to raise trunk and support RLE  Transfers Overall transfer level: Needs assistance Equipment used: Rolling walker (2 wheeled) Transfers: Sit to/from Stand Sit to Stand: Min assist;From elevated surface         General transfer comment: min A to rise, Verbal and manual cues for hand placement  Ambulation/Gait Ambulation/Gait assistance: Min guard Gait Distance (Feet): 40 Feet Assistive device: Rolling walker (2 wheeled) Gait Pattern/deviations: Step-to pattern;Decreased stride length Gait velocity: decr   General Gait Details: VCs sequencing  Stairs            Wheelchair Mobility    Modified Rankin  (Stroke Patients Only)       Balance Overall balance assessment: History of Falls;Mild deficits observed, not formally tested                                           Pertinent Vitals/Pain Pain Assessment: No/denies pain    Home Living Family/patient expects to be discharged to:: Private residence Living Arrangements: Spouse/significant other Available Help at Discharge: Family;Available PRN/intermittently   Home Access: Stairs to enter Entrance Stairs-Rails: Right Entrance Stairs-Number of Steps: 3 Home Layout: One level Home Equipment: Walker - 2 wheels      Prior Function Level of Independence: Independent         Comments: walks without AD, h/o falls 2* RLE issues from prior CVAs     Hand Dominance        Extremity/Trunk Assessment   Upper Extremity Assessment Upper Extremity Assessment: Overall WFL for tasks assessed    Lower Extremity Assessment Lower Extremity Assessment: RLE deficits/detail RLE Deficits / Details: knee ext at least 3/5, tolerated heel slides (hip flexion to ~45*) and hip ABDuction AAROM to 15* without pain RLE Sensation: decreased light touch(pt reports R great toe is numb at baseline)    Cervical / Trunk Assessment Cervical / Trunk Assessment: Normal  Communication   Communication: No difficulties  Cognition Arousal/Alertness: Awake/alert Behavior During Therapy: WFL for tasks assessed/performed Overall Cognitive Status: Within Functional Limits for tasks assessed  General Comments      Exercises Total Joint Exercises Ankle Circles/Pumps: AROM;Both;10 reps;Supine Heel Slides: AAROM;Right;10 reps;Supine Hip ABduction/ADduction: AAROM;Right;10 reps;Supine Long Arc Quad: AROM;Right;5 reps;Seated   Assessment/Plan    PT Assessment Patient needs continued PT services  PT Problem List Decreased activity tolerance;Decreased mobility;Decreased  balance;Decreased knowledge of use of DME       PT Treatment Interventions DME instruction;Gait training;Stair training;Therapeutic exercise;Therapeutic activities;Patient/family education    PT Goals (Current goals can be found in the Care Plan section)  Acute Rehab PT Goals Patient Stated Goal: yardwork PT Goal Formulation: With patient Time For Goal Achievement: 08/09/18 Potential to Achieve Goals: Good    Frequency 7X/week   Barriers to discharge        Co-evaluation               AM-PAC PT "6 Clicks" Mobility  Outcome Measure Help needed turning from your back to your side while in a flat bed without using bedrails?: A Little Help needed moving from lying on your back to sitting on the side of a flat bed without using bedrails?: A Little Help needed moving to and from a bed to a chair (including a wheelchair)?: A Little Help needed standing up from a chair using your arms (e.g., wheelchair or bedside chair)?: A Little Help needed to walk in hospital room?: A Little Help needed climbing 3-5 steps with a railing? : A Lot 6 Click Score: 17    End of Session Equipment Utilized During Treatment: Gait belt Activity Tolerance: Patient tolerated treatment well;No increased pain Patient left: in chair;with chair alarm set Nurse Communication: Mobility status PT Visit Diagnosis: History of falling (Z91.81);Difficulty in walking, not elsewhere classified (R26.2)    Time: 1859-0931 PT Time Calculation (min) (ACUTE ONLY): 21 min   Charges:   PT Evaluation $PT Eval Low Complexity: 1 Low          Blondell Reveal Kistler PT 08/02/2018  Acute Rehabilitation Services Pager [phone] Office [phone]

## 2018-08-03 ENCOUNTER — Encounter (HOSPITAL_COMMUNITY): Payer: Self-pay | Admitting: Orthopedic Surgery

## 2018-08-03 LAB — GLUCOSE, CAPILLARY
Glucose-Capillary: 146 mg/dL — ABNORMAL HIGH (ref 70–99)
Glucose-Capillary: 176 mg/dL — ABNORMAL HIGH (ref 70–99)
Glucose-Capillary: 252 mg/dL — ABNORMAL HIGH (ref 70–99)

## 2018-08-03 NOTE — Progress Notes (Signed)
Pt discharged with family.  Discharge instructions done, written information given.

## 2018-08-03 NOTE — Progress Notes (Signed)
Physical Therapy Progress Note  Clinical Impression: Pt seen for LE strengthening exercises. PT provided HEP handout and reviewed in full with pt. PT provided pt education re: ice, generalized walking program and car transfers with demonstration. Pt would continue to benefit from skilled physical therapy services at this time while admitted and after d/c to address the below listed limitations in order to improve overall safety and independence with functional mobility.  Sherie Don, [place], DPT  Acute Rehabilitation Services Pager [phone] Office [phone]    08/03/18 1100  PT Visit Information  Last PT Received On 08/03/18  Assistance Needed +1  History of Present Illness Pt is an 83 y/o male admitted secondary to R THA anterior approach on 08/02/18. PMH including but not limited to CAD, NSTEMI, HTN, DM, TIA, and melanoma. Pt reports h/o 2 CVAs with RLE deficits and h/o multiple falls.  Precautions  Precautions Fall  Restrictions  Weight Bearing Restrictions Yes  RLE Weight Bearing WBAT  Pain Assessment  Pain Assessment Faces  Faces Pain Scale 6  Pain Location R hip with movement  Pain Descriptors / Indicators Sore  Pain Intervention(s) Monitored during session;Repositioned  Cognition  Arousal/Alertness Awake/alert  Behavior During Therapy WFL for tasks assessed/performed  Overall Cognitive Status Within Functional Limits for tasks assessed  Exercises  Exercises Total Joint  Total Joint Exercises  Ankle Circles/Pumps AROM;Both;10 reps;Supine  Heel Slides AAROM;Right;10 reps;Supine  Hip ABduction/ADduction AROM;Right;10 reps;Supine  Quad Sets AROM;Strengthening;Right;10 reps;Supine  Short Arc Quad AROM;Strengthening;Right;10 reps;Supine  PT - End of Session  Activity Tolerance Patient tolerated treatment well  Patient left in bed;with call bell/phone within reach  Nurse Communication Mobility status   PT - Assessment/Plan  PT Plan Current plan remains appropriate  PT  Visit Diagnosis Other abnormalities of gait and mobility (R26.89)  PT Frequency (ACUTE ONLY) 7X/week  Follow Up Recommendations Supervision for mobility/OOB;Home health PT  PT equipment 3in1 (PT)  AM-PAC PT "6 Clicks" Mobility Outcome Measure (Version 2)  Help needed turning from your back to your side while in a flat bed without using bedrails? 3  Help needed moving from lying on your back to sitting on the side of a flat bed without using bedrails? 3  Help needed moving to and from a bed to a chair (including a wheelchair)? 3  Help needed standing up from a chair using your arms (e.g., wheelchair or bedside chair)? 3  Help needed to walk in hospital room? 3  Help needed climbing 3-5 steps with a railing?  2  6 Click Score 17  Consider Recommendation of Discharge To: Home with [facility]  PT Goal Progression  Progress towards PT goals Progressing toward goals  Acute Rehab PT Goals  PT Goal Formulation With patient  Time For Goal Achievement 08/09/18  Potential to Achieve Goals Good  PT Time Calculation  PT Start Time (ACUTE ONLY) 0810  PT Stop Time (ACUTE ONLY) 0830  PT Time Calculation (min) (ACUTE ONLY) 20 min  PT General Charges  $$ ACUTE PT VISIT 1 Visit  PT Treatments  $Therapeutic Exercise 8-22 mins

## 2018-08-03 NOTE — Progress Notes (Signed)
    Subjective: Patient reports pain as moderate to severe.  Tolerating diet.  Urinating.  No CP, SOB.  Good early mobilization with therapy yesterday-walked 40 feet.  Quite a bit more sore this morning lateral thigh.  Objective:   VITALS:   Vitals:   08/02/18 2239 08/02/18 2242 08/03/18 0142 08/03/18 0438  BP: (!) 175/87 (!) 167/86 (!) 167/88 (!) 180/80  Pulse: 95 96 97 95  Resp:   18 20  Temp:   98.6 F (37 C) 100 F (37.8 C)  TempSrc:   Oral Oral  SpO2:  97% 96% 97%  Weight:      Height:       CBC Latest Ref Rng & Units 07/22/2018 04/27/2017 04/26/2017  WBC 4.0 - 10.5 K/uL 4.7 4.8 5.6  Hemoglobin 13.0 - 17.0 g/dL 11.4(L) 13.3 13.6  Hematocrit 39.0 - 52.0 % 37.1(L) 40.9 41.6  Platelets 150 - 400 K/uL 312 241 254   BMP Latest Ref Rng & Units 07/22/2018 05/07/2017 04/27/2017  Glucose 70 - 99 mg/dL 144(H) 144(H) 145(H)  BUN 8 - 23 mg/dL 20 24 27(H)  Creatinine 0.61 - 1.24 mg/dL 0.94 1.03 0.92  BUN/Creat Ratio 6 - 22 (calc) - NOT APPLICABLE -  Sodium 867 - 145 mmol/L 140 142 138  Potassium 3.5 - 5.1 mmol/L 3.9 4.7 3.6  Chloride 98 - 111 mmol/L 109 107 103  CO2 22 - 32 mmol/L 23 27 25   Calcium 8.9 - 10.3 mg/dL 9.2 9.8 9.1   Intake/Output      07/21 0701 - 07/22 0700 07/22 0701 - 07/23 0700   P.O. 480    I.V. (mL/kg) 3808.3 (43.7)    IV Piggyback 320.2    Total Intake(mL/kg) 4608.5 (52.8)    Urine (mL/kg/hr) 2900    Blood 250    Total Output 3150    Net +1458.5         Urine Occurrence 1 x       Physical Exam: General: NAD.  Supine in bed.  Calm, conversant. Resp: No increased wob Cardio: regular rate and rhythm ABD soft Neurologically intact MSK RLE: Neurovascularly intact Sensation intact distally Feet warm Dorsiflexion/Plantar flexion intact Incision: dressing C/D/I   Assessment: 1 Day Post-Op  S/P Procedure(s) (LRB): TOTAL HIP ARTHROPLASTY ANTERIOR APPROACH (Right) by Dr. Ernesta Amble. Percell Miller on 08/02/2018  Principal Problem:   Primary osteoarthritis  of right hip Active Problems:   Type 2 diabetes mellitus (HCC)   Hyperlipidemia   TIA (transient ischemic attack)   Essential hypertension   CVA (cerebral vascular accident) ([place])   Primary localized osteoarthritis of hip   Primary osteoarthritis, status post total hip arthroplasty More sore postop day 1 Tolerating diet and voiding Good mobilization yesterday  Plan: Up with therapy D/C IV fluids Incentive Spirometry Apply ice Continue Plavix/home medicines.  Weight Bearing: Weight Bearing as Tolerated (WBAT) RLE Dressings: Maintain Mepilex.   VTE prophylaxis: Aspirin, SCDs, ambulation.  Resume Plavix.  Dispo: Home today pending pain control and mobilization with therapy.   Charna Elizabeth Martensen III, PA-C 08/03/2018, 7:53 AM

## 2018-08-03 NOTE — Progress Notes (Signed)
Discharge Plan of Care: [place]  prearranged for PT Has(RW and 3 in 1)

## 2018-08-03 NOTE — Progress Notes (Signed)
Physical Therapy Treatment Patient Details Name: Steve Ramirez MRN: [id number] DOB: 07-28-1935 Today's Date: 08/03/2018    History of Present Illness Pt is an 83 y/o male admitted secondary to R THA anterior approach on 08/02/18. PMH including but not limited to CAD, NSTEMI, HTN, DM, TIA, and melanoma. Pt reports h/o 2 CVAs with RLE deficits and h/o multiple falls.    PT Comments    Improved activity tolerance this session. Pt ambulated 120' with RW, without profuse sweating, no loss of balance, improved sequencing and safety awareness. Reviewed THA HEP, pt demonstrates good understanding. From PT standpoint, he is ready to DC home.    Follow Up Recommendations  Supervision for mobility/OOB;Home health PT     Equipment Recommendations  3in1 (PT)    Recommendations for Other Services       Precautions / Restrictions Precautions Precautions: Fall Precaution Comments: pt reports h/o multiple falls 2* RLE issues from 2 prior CVAs Restrictions Weight Bearing Restrictions: No RLE Weight Bearing: Weight bearing as tolerated Other Position/Activity Restrictions: WBAT    Mobility  Bed Mobility Overal bed mobility: Needs Assistance Bed Mobility: Supine to Sit     Supine to sit: HOB elevated;Mod assist     General bed mobility comments: up in recliner  Transfers Overall transfer level: Needs assistance Equipment used: Rolling walker (2 wheeled) Transfers: Sit to/from Stand Sit to Stand: From elevated surface;Min guard         General transfer comment: VCs hand placement  Ambulation/Gait Ambulation/Gait assistance: Supervision Gait Distance (Feet): 120 Feet Assistive device: Rolling walker (2 wheeled) Gait Pattern/deviations: Step-to pattern;Decreased stride length;Decreased step length - right Gait velocity: decr   General Gait Details: improved sequencing   Stairs Stairs: Yes Stairs assistance: Min guard Stair Management: Two rails;Step to  pattern;Forwards Number of Stairs: 3 General stair comments: frequent VCs for sequencing, pt minimally responsive to VCs for sequencing, he preferred to attempt ascending with surgical leg and descending with nonsurgical leg   Wheelchair Mobility    Modified Rankin (Stroke Patients Only)       Balance Overall balance assessment: History of Falls;Mild deficits observed, not formally tested                                          Cognition Arousal/Alertness: Awake/alert Behavior During Therapy: WFL for tasks assessed/performed Overall Cognitive Status: Within Functional Limits for tasks assessed                                        Exercises Total Joint Exercises Ankle Circles/Pumps: AROM;Both;10 reps;Supine Quad Sets: AROM;Strengthening;Right;10 reps;Supine Short Arc Quad: AROM;Strengthening;Right;10 reps;Supine Heel Slides: AAROM;Right;10 reps;Supine Hip ABduction/ADduction: AROM;Right;10 reps;Supine Long Arc Quad: AROM;Right;10 reps;Seated    General Comments        Pertinent Vitals/Pain Pain Assessment: 0-10 Pain Score: 3  Faces Pain Scale: Hurts even more Pain Location: L hip Pain Descriptors / Indicators: Sore Pain Intervention(s): Limited activity within patient's tolerance;Monitored during session;Premedicated before session;Ice applied    Home Living                      Prior Function            PT Goals (current goals can now be found in the care plan section) Acute Rehab PT  Goals Patient Stated Goal: yardwork PT Goal Formulation: With patient Time For Goal Achievement: 08/09/18 Potential to Achieve Goals: Good Progress towards PT goals: Progressing toward goals    Frequency    7X/week      PT Plan Current plan remains appropriate    Co-evaluation              AM-PAC PT "6 Clicks" Mobility   Outcome Measure  Help needed turning from your back to your side while in a flat bed without  using bedrails?: A Little Help needed moving from lying on your back to sitting on the side of a flat bed without using bedrails?: A Little Help needed moving to and from a bed to a chair (including a wheelchair)?: A Little Help needed standing up from a chair using your arms (e.g., wheelchair or bedside chair)?: A Little Help needed to walk in hospital room?: A Little Help needed climbing 3-5 steps with a railing? : A Little 6 Click Score: 18    End of Session Equipment Utilized During Treatment: Gait belt Activity Tolerance: Patient tolerated treatment well Patient left: with call bell/phone within reach;in chair;with nursing/sitter in room Nurse Communication: Mobility status PT Visit Diagnosis: Other abnormalities of gait and mobility (R26.89)     Time: 2637-8588 PT Time Calculation (min) (ACUTE ONLY): 18 min  Charges:  $Gait Training: 8-22 mins                      Blondell Reveal Kistler PT 08/03/2018  Acute Rehabilitation Services Pager [phone] Office [phone]

## 2018-08-03 NOTE — Progress Notes (Signed)
Physical Therapy Treatment Patient Details Name: Steve Ramirez MRN: [id number] DOB: Nov 24, 1935 Today's Date: 08/03/2018    History of Present Illness Pt is an 83 y/o male admitted secondary to R THA anterior approach on 08/02/18. PMH including but not limited to CAD, NSTEMI, HTN, DM, TIA, and melanoma. Pt reports h/o 2 CVAs with RLE deficits and h/o multiple falls.    PT Comments    Pt is progressing with mobility, he ambulated 56' with RW with increased time and frequent verbal cues for sequencing. Pt had profuse sweating while walking, vital signs stable (BP 113/63, HR 89, SaO2 97% on room air). Stair training completed. Pt is minimally responsive to recommendations from PT regarding hand placement during transfers and with sequencing while walking and with stair training. Second session of PT recommended today.   Follow Up Recommendations  Supervision for mobility/OOB;Home health PT     Equipment Recommendations  3in1 (PT)    Recommendations for Other Services       Precautions / Restrictions Precautions Precautions: Fall Precaution Comments: pt reports h/o multiple falls 2* RLE issues from 2 prior CVAs Restrictions Weight Bearing Restrictions: No RLE Weight Bearing: Weight bearing as tolerated Other Position/Activity Restrictions: WBAT    Mobility  Bed Mobility Overal bed mobility: Needs Assistance Bed Mobility: Supine to Sit     Supine to sit: HOB elevated;Mod assist     General bed mobility comments: HOB up, Mod A to raise trunk and support RLE, pt initially had posterior lean in sitting which he stated is baseline 2* prior CVAs  Transfers Overall transfer level: Needs assistance Equipment used: Rolling walker (2 wheeled) Transfers: Sit to/from Stand Sit to Stand: Min assist;From elevated surface         General transfer comment: min A to rise, Verbal and manual cues for hand placement (pt stated he prefers to place both hands on RW for sit to  stand)  Ambulation/Gait Ambulation/Gait assistance: Min guard Gait Distance (Feet): 60 Feet Assistive device: Rolling walker (2 wheeled) Gait Pattern/deviations: Step-to pattern;Decreased stride length;Decreased step length - right Gait velocity: decr   General Gait Details: frequent VCs for sequencing and positioning in RW, pt minimally responsive to verbal instructions for sequencing; pt had profuse sweating while ambulating, distance limited by fatigue, Vitals in sitting after walking were: BP 113/63, HR 89, SaO2 97% room air   Stairs Stairs: Yes Stairs assistance: Min guard Stair Management: Two rails;Step to pattern;Forwards Number of Stairs: 3 General stair comments: frequent VCs for sequencing, pt minimally responsive to VCs for sequencing, he preferred to attempt ascending with surgical leg and descending with nonsurgical leg   Wheelchair Mobility    Modified Rankin (Stroke Patients Only)       Balance Overall balance assessment: History of Falls;Mild deficits observed, not formally tested                                          Cognition Arousal/Alertness: Awake/alert Behavior During Therapy: WFL for tasks assessed/performed Overall Cognitive Status: Within Functional Limits for tasks assessed                                        Exercises Total Joint Exercises Ankle Circles/Pumps: AROM;Both;10 reps;Supine Quad Sets: AROM;Strengthening;Right;10 reps;Supine Short Arc Quad: AROM;Strengthening;Right;10 reps;Supine Heel Slides: AAROM;Right;10 reps;Supine  Hip ABduction/ADduction: AROM;Right;10 reps;Supine    General Comments        Pertinent Vitals/Pain Pain Assessment: 0-10 Pain Score: 5  Faces Pain Scale: Hurts even more Pain Location: R hip Pain Descriptors / Indicators: Sore Pain Intervention(s): Limited activity within patient's tolerance;Monitored during session;Premedicated before session;Ice applied    Home  Living                      Prior Function            PT Goals (current goals can now be found in the care plan section) Acute Rehab PT Goals Patient Stated Goal: yardwork PT Goal Formulation: With patient Time For Goal Achievement: 08/09/18 Potential to Achieve Goals: Good Progress towards PT goals: Progressing toward goals    Frequency    7X/week      PT Plan Current plan remains appropriate    Co-evaluation              AM-PAC PT "6 Clicks" Mobility   Outcome Measure  Help needed turning from your back to your side while in a flat bed without using bedrails?: A Little Help needed moving from lying on your back to sitting on the side of a flat bed without using bedrails?: A Lot Help needed moving to and from a bed to a chair (including a wheelchair)?: A Little Help needed standing up from a chair using your arms (e.g., wheelchair or bedside chair)?: A Little Help needed to walk in hospital room?: A Little Help needed climbing 3-5 steps with a railing? : A Lot 6 Click Score: 16    End of Session Equipment Utilized During Treatment: Gait belt Activity Tolerance: Patient tolerated treatment well Patient left: with call bell/phone within reach;in chair Nurse Communication: Mobility status PT Visit Diagnosis: Other abnormalities of gait and mobility (R26.89)     Time: 8280-0349 PT Time Calculation (min) (ACUTE ONLY): 27 min  Charges:  $Gait Training: 8-22 mins  $Therapeutic Activity: 8-22 mins                    Blondell Reveal Kistler PT 08/03/2018  Acute Rehabilitation Services Pager [phone] Office [phone]

## 2018-08-03 NOTE — Discharge Summary (Signed)
Discharge Summary  Patient ID: Steve Ramirez MRN: [id number] DOB/AGE: 81/15/1937 81 y.o.  Admit date: 08/02/2018 Discharge date: 08/03/2018  Admission Diagnoses:  Primary osteoarthritis of right hip  Discharge Diagnoses:  Principal Problem:   Primary osteoarthritis of right hip Active Problems:   Type 2 diabetes mellitus (HCC)   Hyperlipidemia   TIA (transient ischemic attack)   Essential hypertension   CVA (cerebral vascular accident) ([place])   Primary localized osteoarthritis of hip   Past Medical History:  Diagnosis Date  . Coronary atherosclerosis of native coronary artery   . Essential hypertension, benign   . GIB (gastrointestinal bleeding)    Colonic diverticular bleed  . History of melanoma   . History of TIAs   . Stroke ([place])   . Type 2 diabetes mellitus (HCC)     Surgeries: Procedure(s): TOTAL HIP ARTHROPLASTY ANTERIOR APPROACH on 08/02/2018   Consultants (if any):   Discharged Condition: Improved  Hospital Course: Steve Ramirez is an 83 y.o. male who was admitted 08/02/2018 with a diagnosis of Primary osteoarthritis of right hip and went to the operating room on 08/02/2018 and underwent the above named procedures.    He was given perioperative antibiotics:  Anti-infectives (From admission, onward)   Start     Dose/Rate Route Frequency Ordered Stop   08/02/18 1600  ceFAZolin (ANCEF) IVPB 1 g/50 mL premix     1 g 100 mL/hr over 30 Minutes Intravenous Every 6 hours 08/02/18 1418 08/02/18 2217   08/02/18 0845  ceFAZolin (ANCEF) IVPB 2g/100 mL premix     2 g 200 mL/hr over 30 Minutes Intravenous On call to O.R. 08/02/18 4166 08/02/18 0945    .  He was given sequential compression devices, early ambulation, and Aspirin for DVT prophylaxis.  Plavix was resumed POD1.  He benefited maximally from the hospital stay and there were no complications.    Recent vital signs:  Vitals:   08/03/18 1024 08/03/18 1414  BP: (!) 146/80 119/68  Pulse: 93 83   Resp: 17 17  Temp: 98.6 F (37 C) 97.7 F (36.5 C)  SpO2: 96% 98%    Recent laboratory studies:  Lab Results  Component Value Date   HGB 11.4 (L) 07/22/2018   HGB 13.3 04/27/2017   HGB 13.6 04/26/2017   Lab Results  Component Value Date   WBC 4.7 07/22/2018   PLT 312 07/22/2018   Lab Results  Component Value Date   INR 1.06 04/26/2017   Lab Results  Component Value Date   NA 140 07/22/2018   K 3.9 07/22/2018   CL 109 07/22/2018   CO2 23 07/22/2018   BUN 20 07/22/2018   CREATININE 0.94 07/22/2018   GLUCOSE 144 (H) 07/22/2018    Discharge Medications:   Allergies as of 08/03/2018   No Known Allergies     Medication List    STOP taking these medications   amLODipine 10 MG tablet Commonly known as: NORVASC     TAKE these medications   aspirin EC 81 MG tablet Take 1 tablet (81 mg total) by mouth 2 (two) times daily. For DVT prophylaxis for 30 days after surgery. What changed:   when to take this  additional instructions   baclofen 10 MG tablet Commonly known as: LIORESAL Take 1 tablet (10 mg total) by mouth 3 (three) times daily as needed for muscle spasms.   cetirizine 10 MG tablet Commonly known as: ZYRTEC Take 10 mg by mouth daily.   cloNIDine 0.3 mg/24hr patch  Commonly known as: CATAPRES - Dosed in mg/24 hr Place 1 patch (0.3 mg total) onto the skin once a week.   clopidogrel 75 MG tablet Commonly known as: PLAVIX Take 1 tablet (75 mg total) by mouth daily.   docusate sodium 100 MG capsule Commonly known as: Colace Take 1 capsule (100 mg total) by mouth 2 (two) times daily. To prevent constipation while taking pain medication.   escitalopram 10 MG tablet Commonly known as: LEXAPRO Take 1 tablet (10 mg total) by mouth daily.   fenofibrate 145 MG tablet Commonly known as: TRICOR TAKE 1 TABLET BY MOUTH ONCE DAILY   gabapentin 100 MG capsule Commonly known as: Neurontin Take 1 capsule (100 mg total) by mouth 2 (two) times daily for 14  days. For pain.   HYDROcodone-acetaminophen 5-325 MG tablet Commonly known as: Norco Take 1-2 tablets by mouth every 6 (six) hours as needed for up to 5 days for severe pain.   isosorbide mononitrate 30 MG 24 hr tablet Commonly known as: IMDUR Take 30 mg by mouth daily.   levothyroxine 50 MCG tablet Commonly known as: SYNTHROID Take 1 tablet (50 mcg total) by mouth daily.   lisinopril 40 MG tablet Commonly known as: ZESTRIL Take 1 tablet (40 mg total) by mouth daily.   metFORMIN 500 MG tablet Commonly known as: GLUCOPHAGE Take 1 tablet (500 mg total) by mouth 2 (two) times daily.   metoprolol succinate 25 MG 24 hr tablet Commonly known as: TOPROL-XL Take 25 mg by mouth daily. What changed: Another medication with the same name was removed. Continue taking this medication, and follow the directions you see here.   multivitamin tablet Take 1 tablet by mouth daily.   Myrbetriq 50 MG Tb24 tablet Generic drug: mirabegron ER Take 50 mg by mouth daily.   nitroGLYCERIN 0.4 MG SL tablet Commonly known as: NITROSTAT Place 0.4 mg under the tongue every 5 (five) minutes as needed for chest pain.   Omega 3 1000 MG Caps Take 1,000 mg by mouth daily.   ondansetron 4 MG tablet Commonly known as: Zofran Take 1 tablet (4 mg total) by mouth every 8 (eight) hours as needed for nausea or vomiting.   pravastatin 10 MG tablet Commonly known as: PRAVACHOL Take 10 mg by mouth daily.   rOPINIRole 0.5 MG tablet Commonly known as: REQUIP Take 2 tablets (1 mg total) by mouth at bedtime. What changed: how much to take   spironolactone 25 MG tablet Commonly known as: ALDACTONE Take 1 tablet (25 mg total) by mouth daily.   triamterene-hydrochlorothiazide 37.5-25 MG tablet Commonly known as: MAXZIDE-25 Take 1 tablet by mouth daily.       Diagnostic Studies: Dg Pelvis Portable  Result Date: 08/02/2018 CLINICAL DATA:  Post RIGHT hip replacement EXAM: PORTABLE PELVIS 1-2 VIEWS  COMPARISON:  Portable exam 1216 hours compared to earlier intraoperative images of 08/02/2018 FINDINGS: Single AP view. RIGHT hip prosthesis identified. No fracture or dislocation. Bones demineralized. LEFT hip joint space preserved. IMPRESSION: RIGHT hip prosthesis without acute complication on single AP view. Electronically Signed   By: Lavonia Dana M.D.   On: 08/02/2018 13:12   Dg C-arm 1-60 Min-no Report  Result Date: 08/02/2018 Fluoroscopy was utilized by the requesting physician.  No radiographic interpretation.   Dg Hip Operative Unilat With Pelvis Right  Result Date: 08/02/2018 CLINICAL DATA:  Anterior RIGHT hip replacement EXAM: OPERATIVE RIGHT HIP (WITH PELVIS IF PERFORMED) 2 VIEWS TECHNIQUE: Fluoroscopic spot image(s) were submitted for interpretation post-operatively. COMPARISON:  02/17/2017 FLUOROSCOPY TIME:  0 minutes 17 seconds Dose: 2.73 mGy FINDINGS: Images demonstrate placement of a RIGHT hip prosthesis with acetabular and femoral components identified in grossly expected position. Bones appear demineralized. No fracture or acute complication seen. IMPRESSION: RIGHT hip prosthesis without acute abnormality. Electronically Signed   By: Lavonia Dana M.D.   On: 08/02/2018 13:05    Disposition: Discharge disposition: 01-Home or Self Care       Discharge Instructions    Discharge patient   Complete by: As directed    Okay to Discharge IF: Cleared by PT, pain & nausea controlled PO, voiding, ambulating and tolerating diet.   Discharge disposition: 01-Home or Self Care   Discharge patient date: 08/03/2018      Follow-up Information    Renette Butters, MD.   Specialty: Orthopedic Surgery Contact information: [address] [place] [address] [phone]            Signed: Prudencio Burly III PA-C 08/03/2018, 6:04 PM

## 2018-08-07 DIAGNOSIS — Z471 Aftercare following joint replacement surgery: Secondary | ICD-10-CM | POA: Diagnosis not present

## 2018-08-07 DIAGNOSIS — Z87891 Personal history of nicotine dependence: Secondary | ICD-10-CM | POA: Diagnosis not present

## 2018-08-07 DIAGNOSIS — Z85820 Personal history of malignant melanoma of skin: Secondary | ICD-10-CM | POA: Diagnosis not present

## 2018-08-07 DIAGNOSIS — I2510 Atherosclerotic heart disease of native coronary artery without angina pectoris: Secondary | ICD-10-CM | POA: Diagnosis not present

## 2018-08-07 DIAGNOSIS — I10 Essential (primary) hypertension: Secondary | ICD-10-CM | POA: Diagnosis not present

## 2018-08-07 DIAGNOSIS — Z7984 Long term (current) use of oral hypoglycemic drugs: Secondary | ICD-10-CM | POA: Diagnosis not present

## 2018-08-07 DIAGNOSIS — Z8673 Personal history of transient ischemic attack (TIA), and cerebral infarction without residual deficits: Secondary | ICD-10-CM | POA: Diagnosis not present

## 2018-08-07 DIAGNOSIS — I252 Old myocardial infarction: Secondary | ICD-10-CM | POA: Diagnosis not present

## 2018-08-07 DIAGNOSIS — E119 Type 2 diabetes mellitus without complications: Secondary | ICD-10-CM | POA: Diagnosis not present

## 2018-08-07 DIAGNOSIS — E785 Hyperlipidemia, unspecified: Secondary | ICD-10-CM | POA: Diagnosis not present

## 2018-08-11 DIAGNOSIS — Z471 Aftercare following joint replacement surgery: Secondary | ICD-10-CM | POA: Diagnosis not present

## 2018-08-11 DIAGNOSIS — I252 Old myocardial infarction: Secondary | ICD-10-CM | POA: Diagnosis not present

## 2018-08-11 DIAGNOSIS — Z8673 Personal history of transient ischemic attack (TIA), and cerebral infarction without residual deficits: Secondary | ICD-10-CM | POA: Diagnosis not present

## 2018-08-11 DIAGNOSIS — Z7984 Long term (current) use of oral hypoglycemic drugs: Secondary | ICD-10-CM | POA: Diagnosis not present

## 2018-08-11 DIAGNOSIS — E785 Hyperlipidemia, unspecified: Secondary | ICD-10-CM | POA: Diagnosis not present

## 2018-08-11 DIAGNOSIS — I2510 Atherosclerotic heart disease of native coronary artery without angina pectoris: Secondary | ICD-10-CM | POA: Diagnosis not present

## 2018-08-11 DIAGNOSIS — Z85820 Personal history of malignant melanoma of skin: Secondary | ICD-10-CM | POA: Diagnosis not present

## 2018-08-11 DIAGNOSIS — E119 Type 2 diabetes mellitus without complications: Secondary | ICD-10-CM | POA: Diagnosis not present

## 2018-08-11 DIAGNOSIS — Z87891 Personal history of nicotine dependence: Secondary | ICD-10-CM | POA: Diagnosis not present

## 2018-08-11 DIAGNOSIS — I10 Essential (primary) hypertension: Secondary | ICD-10-CM | POA: Diagnosis not present

## 2018-08-12 DIAGNOSIS — R69 Illness, unspecified: Secondary | ICD-10-CM | POA: Diagnosis not present

## 2018-08-15 DIAGNOSIS — Z471 Aftercare following joint replacement surgery: Secondary | ICD-10-CM | POA: Diagnosis not present

## 2018-08-15 DIAGNOSIS — Z7984 Long term (current) use of oral hypoglycemic drugs: Secondary | ICD-10-CM | POA: Diagnosis not present

## 2018-08-15 DIAGNOSIS — I10 Essential (primary) hypertension: Secondary | ICD-10-CM | POA: Diagnosis not present

## 2018-08-15 DIAGNOSIS — I252 Old myocardial infarction: Secondary | ICD-10-CM | POA: Diagnosis not present

## 2018-08-15 DIAGNOSIS — I2510 Atherosclerotic heart disease of native coronary artery without angina pectoris: Secondary | ICD-10-CM | POA: Diagnosis not present

## 2018-08-15 DIAGNOSIS — Z87891 Personal history of nicotine dependence: Secondary | ICD-10-CM | POA: Diagnosis not present

## 2018-08-15 DIAGNOSIS — Z8673 Personal history of transient ischemic attack (TIA), and cerebral infarction without residual deficits: Secondary | ICD-10-CM | POA: Diagnosis not present

## 2018-08-15 DIAGNOSIS — Z85820 Personal history of malignant melanoma of skin: Secondary | ICD-10-CM | POA: Diagnosis not present

## 2018-08-15 DIAGNOSIS — E119 Type 2 diabetes mellitus without complications: Secondary | ICD-10-CM | POA: Diagnosis not present

## 2018-08-15 DIAGNOSIS — E785 Hyperlipidemia, unspecified: Secondary | ICD-10-CM | POA: Diagnosis not present

## 2018-08-17 DIAGNOSIS — M1611 Unilateral primary osteoarthritis, right hip: Secondary | ICD-10-CM | POA: Diagnosis not present

## 2018-08-24 DIAGNOSIS — M1611 Unilateral primary osteoarthritis, right hip: Secondary | ICD-10-CM | POA: Diagnosis not present

## 2018-08-31 DIAGNOSIS — M1611 Unilateral primary osteoarthritis, right hip: Secondary | ICD-10-CM | POA: Diagnosis not present

## 2018-09-09 DIAGNOSIS — Z0001 Encounter for general adult medical examination with abnormal findings: Secondary | ICD-10-CM | POA: Diagnosis not present

## 2018-09-09 DIAGNOSIS — E119 Type 2 diabetes mellitus without complications: Secondary | ICD-10-CM | POA: Diagnosis not present

## 2018-09-09 DIAGNOSIS — E663 Overweight: Secondary | ICD-10-CM | POA: Diagnosis not present

## 2018-09-09 DIAGNOSIS — Z6828 Body mass index (BMI) 28.0-28.9, adult: Secondary | ICD-10-CM | POA: Diagnosis not present

## 2018-09-14 DIAGNOSIS — M1612 Unilateral primary osteoarthritis, left hip: Secondary | ICD-10-CM | POA: Diagnosis present

## 2018-09-21 NOTE — H&P (Signed)
HIP ARTHROPLASTY ADMISSION H&P  Patient ID: Steve Ramirez MRN: [id number] DOB/AGE: 1935/12/20 83 y.o.  Chief Complaint: left hip pain.  Planned Procedure Date: 10/18/18 Medical Clearance by Dr. Hilma Favors                   Cardiac Clearance by Dr. Shirlee More Neurology Clearance by Dr. Leta Baptist  HPI: Steve Ramirez is a 83 y.o. male who presents for evaluation of OA LEFT HIP. The patient has a history of pain and functional disability in the left hip due to arthritis and has failed non-surgical conservative treatments for greater than 12 weeks to include NSAID's and/or analgesics and activity modification.  Onset of symptoms was gradual, starting 2 years ago with gradually worsening course since that time.  Patient currently rates pain at 8 out of 10 with activity. Patient has night pain, worsening of pain with activity and weight bearing and pain that interferes with activities of daily living.  Patient has evidence of periarticular osteophytes and joint space narrowing by imaging studies.  There is no active infection.  Past Medical History:  Diagnosis Date  . Coronary atherosclerosis of native coronary artery   . Essential hypertension, benign   . GIB (gastrointestinal bleeding)    Colonic diverticular bleed  . History of melanoma   . History of TIAs   . Stroke ([place])   . Type 2 diabetes mellitus ([place])    Past Surgical History:  Procedure Laterality Date  . COLONOSCOPY  08/13/2010   Procedure: COLONOSCOPY;  Surgeon: Rogene Houston, MD;  Location: [facility];  Service: Endoscopy;  Laterality: N/A;  . KNEE SURGERY     arthroscopic  . LEFT HEART CATHETERIZATION WITH CORONARY ANGIOGRAM N/A 11/24/2010   Procedure: LEFT HEART CATHETERIZATION WITH CORONARY ANGIOGRAM;  Surgeon: Josue Hector, MD;  Location: [facility] CATH LAB;  Service: Cardiovascular;  Laterality: N/A;  . neck fusion     years ago  . TOTAL HIP ARTHROPLASTY Right 08/02/2018   Procedure: TOTAL HIP ARTHROPLASTY  ANTERIOR APPROACH;  Surgeon: Renette Butters, MD;  Location: [facility] ORS;  Service: Orthopedics;  Laterality: Right;   No Known Allergies  Medications: Multivitamin daily Nitrostat 0.4 mg as needed Omega-3 fatty acid [phone] mg daily 81 mg aspirin daily Isosorbide mononitrate ER 30 mg daily Oxybutynin chloride 1 every 8 hours Pravastatin sodium 10 mg daily Saw palmetto 160 mg daily Zyrtec 10 mg daily Plavix 75 mg daily Escitalopram 10 mg daily Levothyroxine 50 mcg daily Tamsulosin 0.4 mg Daily Amlodipine 10 mg daily Clonidine 0.3 mg every 24 hour transdermal patch weekly Lisinopril 40 mg daily Metoprolol succinate ER 25 mg daily Triamterene 37.5 mg-hydrochlorothiazide 25 mg daily TriCor 145 mg daily Metformin 500 mg twice daily Spironolactone 25 mg daily Ropinirole 0.5 mg 1 to 2 tablets nightly  Social History: Married former smoker-1/2 pack/day x 20 years.  Quit 1984.  2 beers a month.  Retired Musician.  Family History  Problem Relation Age of Onset  . Coronary artery disease Father   . Asthma Other   . Emphysema Mother   . Coronary artery disease Paternal Uncle   . Brain cancer Brother     ROS: Currently denies lightheadedness, dizziness, Fever, chills, CP, SOB.   No personal history of DVT, PE. No loose teeth or dentures.  Wears glasses.  Has decreased hearing.  Frequent urination. All other systems have been reviewed and were otherwise currently negative with the exception of those mentioned in the HPI and as above.  Objective: Physical Exam: General: Alert, NAD. Trendelenberg Gait  HEENT: EOMI, Good Neck Extension  Pulm: No increased work of breathing.  Clear B/L A/P w/o crackle or wheeze.  CV: RRR, No m/g/r appreciated  GI: soft, NT, ND Neuro: Neuro without gross focal deficit.  Sensation intact distally Skin: No lesions in the area of chief complaint MSK/Surgical Site: Left Hip pain with passive ROM.  Positive Stinchfield.  5/5 strength.  NVI.   Sensation intact distally.  Imaging Review Plain radiographs demonstrate severe degenerative joint disease of the left hip.  Stable Right THA.  Preoperative templating of the joint replacement has been completed, documented, and submitted to the Operating Room personnel in order to optimize intra-operative equipment management.  Assessment: OA LEFT HIP Principal Problem:   Primary osteoarthritis of left hip Active Problems:   Type 2 diabetes mellitus (HCC)   Hyperlipidemia   TIA (transient ischemic attack)   Essential hypertension   CVA (cerebral vascular accident) ([place])   Plan: Plan for Procedure(s): TOTAL HIP ARTHROPLASTY ANTERIOR APPROACH  The patient history, physical exam, clinical judgement of the provider and imaging are consistent with end stage degenerative joint disease and total joint arthroplasty is deemed medically necessary. The treatment options including medical management, injection therapy, and arthroplasty were discussed at length. The risks and benefits of Procedure(s): TOTAL HIP ARTHROPLASTY ANTERIOR APPROACH were presented and reviewed.  The risks of nonoperative treatment, versus surgical intervention including but not limited to continued pain, aseptic loosening, stiffness, dislocation/subluxation, infection, bleeding, nerve injury, blood clots, cardiopulmonary complications, morbidity, mortality, among others were discussed. The patient verbalizes understanding and wishes to proceed with the plan.  Patient is being admitted for surgery, pain control, PT, prophylactic antibiotics, VTE prophylaxis, progressive ambulation, ADL's and discharge planning.   Dental prophylaxis discussed and recommended for 2 years postoperatively.   The patient does meet the criteria for TXA which will be used perioperatively.    ASA 81 mg BID for DVT prophylaxis in addition to SCDs, and early ambulation.  Hold ASA / Plavix 7 days prior per Cardiology/Neurology.  Resume Plavix ASAP  postoperatively.  Plan for Tylenol only for Pain.  Baclofen for spasm.  Avoid NSAIDs dt heart / TIA hx.  The patient is planning to be discharged home with HHPT ([facility]) in care of his wife.  Anticipated LOS less than 2 midnights.  Insurance approval due to - Age 32 and older with one or more of the following:             - Expected need for hospital services (PT, OT, Nursing) required for safe   discharge             - Anticipated need for postoperative skilled nursing care or inpatient rehab             - Active co-morbidities: Diabetes and Coronary Artery Disease   Prudencio Burly III, PA-C 09/21/2018 8:21 AM

## 2018-10-06 NOTE — Patient Instructions (Addendum)
DUE TO COVID-19 ONLY ONE VISITOR IS ALLOWED TO COME WITH YOU AND STAY IN THE WAITING ROOM ONLY DURING PRE OP AND PROCEDURE DAY OF SURGERY. THE 1 VISITOR MAY VISIT WITH YOU AFTER SURGERY IN YOUR PRIVATE ROOM DURING VISITING HOURS ONLY!  YOU NEED TO HAVE A COVID 19 TEST ON______Friday 10/02/2020_ @_1010  am______, THIS TEST MUST BE DONE BEFORE SURGERY, COME To [facility] Surgery Area.  ONCE YOUR COVID TEST IS COMPLETED, PLEASE BEGIN THE QUARANTINE INSTRUCTIONS AS OUTLINED IN YOUR HANDOUT.                [place]    Your procedure is scheduled on: Tuesday 10/18/2018   Report to [facility] Main  Entrance              Report to admitting at  0735 AM               How to Manage Your Diabetes Before and After Surgery  Why is it important to control my blood sugar before and after surgery? . Improving blood sugar levels before and after surgery helps healing and can limit problems. . A way of improving blood sugar control is eating a healthy diet by: o  Eating less sugar and carbohydrates o  Increasing activity/exercise o  Talking with your doctor about reaching your blood sugar goals . High blood sugars (greater than 180 mg/dL) can raise your risk of infections and slow your recovery, so you will need to focus on controlling your diabetes during the weeks before surgery. . Make sure that the doctor who takes care of your diabetes knows about your planned surgery including the date and location.  How do I manage my blood sugar before surgery? . Check your blood sugar at least 4 times a day, starting 2 days before surgery, to make sure that the level is not too high or low. o Check your blood sugar the morning of your surgery when you wake up and every 2 hours until you get to the Short Stay unit. . If your blood sugar is less than 70 mg/dL, you will need to treat for low blood sugar: o Do not take insulin. o Treat a low blood sugar (less than 70 mg/dL) with  cup of clear  juice (cranberry or apple), 4 glucose tablets, OR glucose gel. o Recheck blood sugar in 15 minutes after treatment (to make sure it is greater than 70 mg/dL). If your blood sugar is not greater than 70 mg/dL on recheck, call [phone] for further instructions. . Report your blood sugar to the short stay nurse when you get to Short Stay.  . If you are admitted to the hospital after surgery: o Your blood sugar will be checked by the staff and you will probably be given insulin after surgery (instead of oral diabetes medicines) to make sure you have good blood sugar levels. o The goal for blood sugar control after surgery is 80-180 mg/dL.   WHAT DO I DO ABOUT MY DIABETES MEDICATION?        The day before surgery, Take Metformin (Glucophage) as usual.  . Do not take oral diabetes medicines (pills) the morning of surgery.      Call this number if you have problems the morning of surgery [phone]    Remember: Do not eat food  :After Midnight.              NO SOLID FOOD AFTER MIDNIGHT THE NIGHT PRIOR TO SURGERY.  NOTHING BY MOUTH EXCEPT CLEAR LIQUIDS UNTIL .               PLEASE FINISH Gatorade G 2 DRINK PER SURGEON ORDER  WHICH NEEDS TO BE COMPLETED AT .   CLEAR LIQUID DIET   Foods Allowed                                                                     Foods Excluded  Coffee and tea, regular and decaf                             liquids that you cannot  Plain Jell-O any favor except red or purple                                           see through such as: Fruit ices (not with fruit pulp)                                     milk, soups, orange juice  Iced Popsicles                                    All solid food Carbonated beverages, regular and diet                                    Cranberry, grape and apple juices Sports drinks like Gatorade Lightly seasoned clear broth or consume(fat free) Sugar, honey syrup  Sample Menu Breakfast                                 Lunch                                     Supper Cranberry juice                    Beef broth                            Chicken broth Jell-O                                     Grape juice                           Apple juice Coffee or tea                        Jell-O  Popsicle                                                Coffee or tea                        Coffee or tea  _____________________________________________________________________                BRUSH YOUR TEETH MORNING OF SURGERY AND RINSE YOUR MOUTH OUT, NO CHEWING GUM CANDY OR MINTS.     Take these medicines the morning of surgery with A SIP OF WATER: Escitalopram (Lexapro), Mirabegron ER (Myrbetriq)              DO NOT TAKE ANY DIABETIC MEDICATIONS DAY OF YOUR SURGERY!                               You may not have any metal on your body including hair pins and              piercings  Do not wear jewelry, make-up, lotions, powders or perfumes, deodorant                         Men may shave face and neck.   Do not bring valuables to the hospital. [place].  Contacts, dentures or bridgework may not be worn into surgery.  Leave suitcase in the car. After surgery it may be brought to your room.                  Please read over the following fact sheets you were given: _____________________________________________________________________             [facility] - Preparing for Surgery Before surgery, you can play an important role.  Because skin is not sterile, your skin needs to be as free of germs as possible.  You can reduce the number of germs on your skin by washing with CHG (chlorahexidine gluconate) soap before surgery.  CHG is an antiseptic cleaner which kills germs and bonds with the skin to continue killing germs even after washing. Please DO NOT use if you have an allergy to CHG or antibacterial soaps.  If your skin  becomes reddened/irritated stop using the CHG and inform your nurse when you arrive at Short Stay. Do not shave (including legs and underarms) for at least 48 hours prior to the first CHG shower.  You may shave your face/neck. Please follow these instructions carefully:  1.  Shower with CHG Soap the night before surgery and the  morning of Surgery.  2.  If you choose to wash your hair, wash your hair first as usual with your  normal  shampoo.  3.  After you shampoo, rinse your hair and body thoroughly to remove the  shampoo.                           4.  Use CHG as you would any other liquid soap.  You can apply chg directly  to the skin and wash  Gently with a scrungie or clean washcloth.  5.  Apply the CHG Soap to your body ONLY FROM THE NECK DOWN.   Do not use on face/ open                           Wound or open sores. Avoid contact with eyes, ears mouth and genitals (private parts).                       Wash face,  Genitals (private parts) with your normal soap.             6.  Wash thoroughly, paying special attention to the area where your surgery  will be performed.  7.  Thoroughly rinse your body with warm water from the neck down.  8.  DO NOT shower/wash with your normal soap after using and rinsing off  the CHG Soap.                9.  Pat yourself dry with a clean towel.            10.  Wear clean pajamas.            11.  Place clean sheets on your bed the night of your first shower and do not  sleep with pets. Day of Surgery : Do not apply any lotions/deodorants the morning of surgery.  Please wear clean clothes to the hospital/surgery center.  FAILURE TO FOLLOW THESE INSTRUCTIONS MAY RESULT IN THE CANCELLATION OF YOUR SURGERY PATIENT SIGNATURE_________________________________  NURSE SIGNATURE__________________________________  ________________________________________________________________________   Adam Phenix  An incentive spirometer is a  tool that can help keep your lungs clear and active. This tool measures how well you are filling your lungs with each breath. Taking long deep breaths may help reverse or decrease the chance of developing breathing (pulmonary) problems (especially infection) following:  A long period of time when you are unable to move or be active. BEFORE THE PROCEDURE   If the spirometer includes an indicator to show your best effort, your nurse or respiratory therapist will set it to a desired goal.  If possible, sit up straight or lean slightly forward. Try not to slouch.  Hold the incentive spirometer in an upright position. INSTRUCTIONS FOR USE  1. Sit on the edge of your bed if possible, or sit up as far as you can in bed or on a chair. 2. Hold the incentive spirometer in an upright position. 3. Breathe out normally. 4. Place the mouthpiece in your mouth and seal your lips tightly around it. 5. Breathe in slowly and as deeply as possible, raising the piston or the ball toward the top of the column. 6. Hold your breath for 3-5 seconds or for as long as possible. Allow the piston or ball to fall to the bottom of the column. 7. Remove the mouthpiece from your mouth and breathe out normally. 8. Rest for a few seconds and repeat Steps 1 through 7 at least 10 times every 1-2 hours when you are awake. Take your time and take a few normal breaths between deep breaths. 9. The spirometer may include an indicator to show your best effort. Use the indicator as a goal to work toward during each repetition. 10. After each set of 10 deep breaths, practice coughing to be sure your lungs are clear. If you have an incision (the cut made at the time of surgery),  support your incision when coughing by placing a pillow or rolled up towels firmly against it. Once you are able to get out of bed, walk around indoors and cough well. You may stop using the incentive spirometer when instructed by your caregiver.  RISKS AND  COMPLICATIONS  Take your time so you do not get dizzy or light-headed.  If you are in pain, you may need to take or ask for pain medication before doing incentive spirometry. It is harder to take a deep breath if you are having pain. AFTER USE  Rest and breathe slowly and easily.  It can be helpful to keep track of a log of your progress. Your caregiver can provide you with a simple table to help with this. If you are using the spirometer at home, follow these instructions: [place] IF:   You are having difficultly using the spirometer.  You have trouble using the spirometer as often as instructed.  Your pain medication is not giving enough relief while using the spirometer.  You develop fever of 100.5 F (38.1 C) or higher. SEEK IMMEDIATE MEDICAL CARE IF:   You cough up bloody sputum that had not been present before.  You develop fever of 102 F (38.9 C) or greater.  You develop worsening pain at or near the incision site. MAKE SURE YOU:   Understand these instructions.  Will watch your condition.  Will get help right away if you are not doing well or get worse. Document Released: 05/11/2006 Document Revised: 03/23/2011 Document Reviewed: 07/12/2006 Exit[employer] Patient Information 2014 Exit[employer], [place].   ________________________________________________________________________  WHAT IS A BLOOD TRANSFUSION? Blood Transfusion Information  A transfusion is the replacement of blood or some of its parts. Blood is made up of multiple cells which provide different functions.  Red blood cells carry oxygen and are used for blood loss replacement.  White blood cells fight against infection.  Platelets control bleeding.  Plasma helps clot blood.  Other blood products are available for specialized needs, such as hemophilia or other clotting disorders. BEFORE THE TRANSFUSION  Who gives blood for transfusions?   Healthy volunteers who are fully evaluated to make sure  their blood is safe. This is blood bank blood. Transfusion therapy is the safest it has ever been in the practice of medicine. Before blood is taken from a donor, a complete history is taken to make sure that person has no history of diseases nor engages in risky social behavior (examples are intravenous drug use or sexual activity with multiple partners). The donor's travel history is screened to minimize risk of transmitting infections, such as malaria. The donated blood is tested for signs of infectious diseases, such as HIV and hepatitis. The blood is then tested to be sure it is compatible with you in order to minimize the chance of a transfusion reaction. If you or a relative donates blood, this is often done in anticipation of surgery and is not appropriate for emergency situations. It takes many days to process the donated blood. RISKS AND COMPLICATIONS Although transfusion therapy is very safe and saves many lives, the main dangers of transfusion include:   Getting an infectious disease.  Developing a transfusion reaction. This is an allergic reaction to something in the blood you were given. Every precaution is taken to prevent this. The decision to have a blood transfusion has been considered carefully by your caregiver before blood is given. Blood is not given unless the benefits outweigh the risks. AFTER THE TRANSFUSION  Right after receiving a blood transfusion, you will usually feel much better and more energetic. This is especially true if your red blood cells have gotten low (anemic). The transfusion raises the level of the red blood cells which carry oxygen, and this usually causes an energy increase.  The nurse administering the transfusion will monitor you carefully for complications. HOME CARE INSTRUCTIONS  No special instructions are needed after a transfusion. You may find your energy is better. Speak with your caregiver about any limitations on activity for underlying diseases  you may have. SEEK MEDICAL CARE IF:   Your condition is not improving after your transfusion.  You develop redness or irritation at the intravenous (IV) site. SEEK IMMEDIATE MEDICAL CARE IF:  Any of the following symptoms occur over the next 12 hours:  Shaking chills.  You have a temperature by mouth above 102 F (38.9 C), not controlled by medicine.  Chest, back, or muscle pain.  People around you feel you are not acting correctly or are confused.  Shortness of breath or difficulty breathing.  Dizziness and fainting.  You get a rash or develop hives.  You have a decrease in urine output.  Your urine turns a dark color or changes to pink, red, or brown. Any of the following symptoms occur over the next 10 days:  You have a temperature by mouth above 102 F (38.9 C), not controlled by medicine.  Shortness of breath.  Weakness after normal activity.  The white part of the eye turns yellow (jaundice).  You have a decrease in the amount of urine or are urinating less often.  Your urine turns a dark color or changes to pink, red, or brown. Document Released: 12/27/1999 Document Revised: 03/23/2011 Document Reviewed: 08/15/2007 [facility] Patient Information 2014 [place], [place].  _______________________________________________________________________

## 2018-10-10 ENCOUNTER — Encounter (HOSPITAL_COMMUNITY): Payer: Self-pay

## 2018-10-10 ENCOUNTER — Other Ambulatory Visit: Payer: Self-pay

## 2018-10-10 ENCOUNTER — Encounter (HOSPITAL_COMMUNITY)
Admission: RE | Admit: 2018-10-10 | Discharge: 2018-10-10 | Disposition: A | Discharge status: Home / Self Care | Payer: Medicare HMO | Source: Ambulatory Visit | Attending: Orthopedic Surgery | Admitting: Orthopedic Surgery

## 2018-10-10 DIAGNOSIS — I4430 Unspecified atrioventricular block: Secondary | ICD-10-CM | POA: Insufficient documentation

## 2018-10-10 DIAGNOSIS — M1612 Unilateral primary osteoarthritis, left hip: Secondary | ICD-10-CM | POA: Diagnosis not present

## 2018-10-10 DIAGNOSIS — E119 Type 2 diabetes mellitus without complications: Secondary | ICD-10-CM | POA: Diagnosis not present

## 2018-10-10 DIAGNOSIS — I498 Other specified cardiac arrhythmias: Secondary | ICD-10-CM | POA: Diagnosis not present

## 2018-10-10 DIAGNOSIS — I440 Atrioventricular block, first degree: Secondary | ICD-10-CM | POA: Insufficient documentation

## 2018-10-10 DIAGNOSIS — I10 Essential (primary) hypertension: Secondary | ICD-10-CM | POA: Insufficient documentation

## 2018-10-10 DIAGNOSIS — Z01812 Encounter for preprocedural laboratory examination: Secondary | ICD-10-CM | POA: Insufficient documentation

## 2018-10-10 DIAGNOSIS — I4510 Unspecified right bundle-branch block: Secondary | ICD-10-CM | POA: Diagnosis not present

## 2018-10-10 LAB — BASIC METABOLIC PANEL
Anion gap: 11 (ref 5–15)
BUN: 24 mg/dL — ABNORMAL HIGH (ref 8–23)
CO2: 20 mmol/L — ABNORMAL LOW (ref 22–32)
Calcium: 9.7 mg/dL (ref 8.9–10.3)
Chloride: 106 mmol/L (ref 98–111)
Creatinine, Ser: 0.98 mg/dL (ref 0.61–1.24)
GFR calc Af Amer: >60 mL/min (ref >60)
GFR calc non Af Amer: >60 mL/min (ref >60)
Glucose, Bld: 133 mg/dL — ABNORMAL HIGH (ref 70–99)
Potassium: 4.5 mmol/L (ref 3.5–5.1)
Sodium: 137 mmol/L (ref 135–145)

## 2018-10-10 LAB — HEMOGLOBIN A1C
Hgb A1c MFr Bld: 7.1 % — ABNORMAL HIGH (ref 4.8–5.6)
Mean Plasma Glucose: 157.07 mg/dL

## 2018-10-10 LAB — CBC
HCT: 38.8 % — ABNORMAL LOW (ref 39.0–52.0)
Hemoglobin: 11.8 g/dL — ABNORMAL LOW (ref 13.0–17.0)
MCH: 24.1 pg — ABNORMAL LOW (ref 26.0–34.0)
MCHC: 30.4 g/dL (ref 30.0–36.0)
MCV: 79.3 fL — ABNORMAL LOW (ref 80.0–100.0)
Platelets: 402 10*3/uL — ABNORMAL HIGH (ref 150–400)
RBC: 4.89 MIL/uL (ref 4.22–5.81)
RDW: 14.5 % (ref 11.5–15.5)
WBC: 5.7 10*3/uL (ref 4.0–10.5)
nRBC: 0 % (ref 0.0–0.2)

## 2018-10-10 LAB — SURGICAL PCR SCREEN
MRSA, PCR: NEGATIVE
Staphylococcus aureus: POSITIVE — AB

## 2018-10-10 LAB — GLUCOSE, CAPILLARY: Glucose-Capillary: 131 mg/dL — ABNORMAL HIGH (ref 70–99)

## 2018-10-10 NOTE — Progress Notes (Signed)
PCP - Dr. Sharilyn Sites Cardiologist - Dr. Celine Ahr   [facility]  Cardiology [place], [place] 06/23/2018 Pre-op clearance  Usually sees Dr. Clista Bernhardt  Chest x-ray - last year Dr. Hilma Favors EKG - 10/10/2018 Stress Test - 07/21/2016 care [facility] [place] ECHO - 12/04/2016 Cardiac Cath - 11/24/2010  Sleep Study - N/A CPAP - N/A  Fasting Blood Sugar - 128-135 Checks Blood Sugar __2___ times a day  Blood Thinner Instructions:Plavix and Aspirin  Aspirin Instructions:Per Dr. Hilma Favors, the last dose of Aspirin is 10/11/2018( on it preventatively)  Last Dose:and per Dr. Shirlee More, last dose of Plavix is 10/11/2018(on it due to Hx Stroke x 2, Hx TIA x 5)  Anesthesia review:  Chart given to Ward Givens to review  Patient has a History of CAD, HTN, TIA's, Stroke (acute ischemic right hemiparesis), Diabetic Type 2.  Patient denies shortness of breath, fever, cough and chest pain at PAT appointment   Patient verbalized understanding of instructions that were given to them at the PAT appointment. Patient was also instructed that they will need to review over the PAT instructions again at home before surgery.

## 2018-10-11 NOTE — Progress Notes (Addendum)
Anesthesia Chart Review   Case: [id number] Date/Time: 10/18/18 0950   Procedure: TOTAL HIP ARTHROPLASTY ANTERIOR APPROACH (Left Hip)   Anesthesia type: Choice   Pre-op diagnosis: OA LEFT HIP   Location: [facility] [facility] / [facility] ORS   Surgeon: Renette Butters, MD      DISCUSSION:83 y.o. former smoker (quit 01/13/80) with h/o CAD(NSTEMI 2012, CTO RCA, No PCI), DM II, HTN, CVA 2019on Plavix, OA left hip scheduled for above procedure 10/18/2018 with Dr. [name].   Pt last seen by cardiologist, Dr. Celine Ahr, 06/23/2018 for preoperative evaluation.  Per OV note CAD stable.  Per Dr. Shirlee More, "Patient has no cardiac contraindication to his pending orthopedic procedure.  Return in 6 months."  Echo results requested.   Last dose of Plavix 10/11/2018.    Echo 07/12/2018 results on chart; EF 60-65%, mild aortic regurgitation.  VS: BP (!) 131/58   Pulse 67   Temp 37.2 C (Oral)   Resp 18   Ht 5\' 8"  (1.727 m)   Wt 84.1 kg   SpO2 97%   BMI 28.19 kg/m   PROVIDERS: Sharilyn Sites, MD is PCP   Celine Ahr, MD is Cardiologist  LABS: Labs reviewed: Acceptable for surgery. (all labs ordered are listed, but only abnormal results are displayed)  Labs Reviewed  SURGICAL PCR SCREEN - Abnormal; Notable for the following components:      Result Value   Staphylococcus aureus POSITIVE (*)    All other components within normal limits  HEMOGLOBIN A1C - Abnormal; Notable for the following components:   Hgb A1c MFr Bld 7.1 (*)    All other components within normal limits  GLUCOSE, CAPILLARY - Abnormal; Notable for the following components:   Glucose-Capillary 131 (*)    All other components within normal limits  BASIC METABOLIC PANEL - Abnormal; Notable for the following components:   CO2 20 (*)    Glucose, Bld 133 (*)    BUN 24 (*)    All other components within normal limits  CBC - Abnormal; Notable for the following components:   Hemoglobin 11.8 (*)    HCT 38.8 (*)    MCV 79.3 (*)     MCH 24.1 (*)    Platelets 402 (*)    All other components within normal limits     IMAGES: US Carotid Bilateral 12/02/2016 IMPRESSION: 1. Diffuse atherosclerotic plaque in the cervical carotids. 2. Elevated right ICA velocities in the lower end of the 50-69% stenotic range. 3. Less than 50% left ICA stenosis. 4. Antegrade flow in both vertebral arteries.  EKG: 10/10/2018 Rate 68 bpm Sinus rhythm with sinus arrhythmia with 1st degree A-V block Left anterior fasicular block Right bundle branch block Abnormal ECG No significant change since last tracing  CV: Echo 07/12/2018 Interpretation Summary:  A complete two-dimensional transthoracic echocardiogram with color flor Doppler and spectral Doppler was performed.  The left ventricular ejection fraction is normal (60-65%).  The left ventricle is normal in size.  There is normal left ventricular wall thickness.  There is mild (1+) aortic regurgitation present.  The aortic valve is trileaflet  The left ventricular wall motion is normal.  There is no thrombus The left atrium is mildly dilated.  There is mild (1+) mitral regurgitation The E/A ratio is consistent with a normal flow pattern   Stress Test 01/22/2016 IMPRESSION: Abnormal cardiac perfusion exam for mild inferolateral ischemia extending towards the apex. Mildly elevated TID .Prognostically this is a intermediate risk scan.  Technique: After the  perfusion exam, gated images the left ventricle were analyzed by computer. Left ventricular ejection fraction was measured.  Findings: Left ventricular ejection fraction is calculated to be 48 %.  IMPRESSION: Left ventricular ejection fraction of 48%.  Technique: Gated imaging of the left ventricle was performed after the perfusion exam.  Findings: Dynamic imaging of the left ventricle demonstrates mild dyssynchrony from the right bundle branch block and mild inferior apical hypokinesis.  IMPRESSION: Mild regional wall  motion abnormality with mildly depressed overall left ventricular systolic function Past Medical History:  Diagnosis Date  . Coronary atherosclerosis of native coronary artery   . Essential hypertension, benign   . GIB (gastrointestinal bleeding)    Colonic diverticular bleed  . History of melanoma   . History of TIAs   . Stroke ([place])   . Type 2 diabetes mellitus ([place])     Past Surgical History:  Procedure Laterality Date  . COLONOSCOPY  08/13/2010   Procedure: COLONOSCOPY;  Surgeon: Rogene Houston, MD;  Location[facility];  Service: Endoscopy;  Laterality: N/A;  . JOINT REPLACEMENT    . KNEE SURGERY     arthroscopic  . LEFT HEART CATHETERIZATION WITH CORONARY ANGIOGRAM N/A 11/24/2010   Procedure: LEFT HEART CATHETERIZATION WITH CORONARY ANGIOGRAM;  Surgeon: Josue Hector, MD;  Location: [facility] CATH LAB;  Service: Cardiovascular;  Laterality: N/A;  . neck fusion     years ago  . TOTAL HIP ARTHROPLASTY Right 08/02/2018   Procedure: TOTAL HIP ARTHROPLASTY ANTERIOR APPROACH;  Surgeon: Renette Butters, MD;  Location: [facility] ORS;  Service: Orthopedics;  Laterality: Right;    MEDICATIONS: . aspirin EC 81 MG tablet  . baclofen (LIORESAL) 10 MG tablet  . cetirizine (ZYRTEC) 10 MG tablet  . cloNIDine (CATAPRES - DOSED IN MG/24 HR) 0.3 mg/24hr patch  . clopidogrel (PLAVIX) 75 MG tablet  . docusate sodium (COLACE) 100 MG capsule  . escitalopram (LEXAPRO) 10 MG tablet  . fenofibrate (TRICOR) 145 MG tablet  . gabapentin (NEURONTIN) 100 MG capsule  . isosorbide mononitrate (IMDUR) 30 MG 24 hr tablet  . levothyroxine (SYNTHROID, LEVOTHROID) 50 MCG tablet  . lisinopril (PRINIVIL,ZESTRIL) 40 MG tablet  . magnesium citrate SOLN  . metFORMIN (GLUCOPHAGE) 500 MG tablet  . metoprolol succinate (TOPROL-XL) 25 MG 24 hr tablet  . mirabegron ER (MYRBETRIQ) 50 MG TB24 tablet  . Multiple Vitamin (MULTIVITAMIN) tablet  . neomycin-bacitracin-polymyxin (NEOSPORIN) ointment  . nitroGLYCERIN (NITROSTAT)  0.4 MG SL tablet  . Omega-3 Fatty Acids (FISH OIL) 1200 MG CAPS  . ondansetron (ZOFRAN) 4 MG tablet  . pravastatin (PRAVACHOL) 10 MG tablet  . rOPINIRole (REQUIP) 0.5 MG tablet  . spironolactone (ALDACTONE) 25 MG tablet  . triamterene-hydrochlorothiazide (MAXZIDE-25) 37.5-25 MG tablet   No current facility-administered medications for this encounter.     Maia Plan [facility] Pre-Surgical Testing [phone] 10/11/18  10:51 AM

## 2018-10-12 NOTE — Anesthesia Preprocedure Evaluation (Deleted)
Anesthesia Evaluation  Patient identified by MRN, date of birth, ID band Patient awake    Reviewed: Allergy & Precautions, H&P , NPO status , Patient's Chart, lab work & pertinent test results, reviewed documented beta blocker date and time   Airway Mallampati: II  TM Distance: >3 FB Neck ROM: full    Dental no notable dental hx.    Pulmonary neg pulmonary ROS, former smoker,    Pulmonary exam normal breath sounds clear to auscultation       Cardiovascular Exercise Tolerance: Good hypertension, Pt. on medications and Pt. on home beta blockers + CAD   Rhythm:regular Rate:Normal  EKG: 10/10/2018 Rate 68 bpm Sinus rhythm with sinus arrhythmia with 1st degree A-V block Left anterior fasicular block Right bundle branch block Abnormal ECG No significant change since last tracing  CV: Echo 07/12/2018 Interpretation Summary:  A complete two-dimensional transthoracic echocardiogram with color flor Doppler and spectral Doppler was performed.  The left ventricular ejection fraction is normal (60-65%).   Stress Test 01/22/2016 IMPRESSION: Abnormal cardiac perfusion exam for mild inferolateral ischemia extending towards the apex. Mildly elevated TID .Prognostically this is a intermediate risk scan.   Neuro/Psych IMAGES: US Carotid Bilateral 12/02/2016 IMPRESSION: 1. Diffuse atherosclerotic plaque in the cervical carotids. 2. Elevated right ICA velocities in the lower end of the 50-69% stenotic range. 3. Less than 50% left ICA stenosis. 4. Antegrade flow in both vertebral arteries.  TIACVA negative psych ROS   GI/Hepatic negative GI ROS, Neg liver ROS,   Endo/Other  negative endocrine ROSdiabetes  Renal/GU negative Renal ROS  negative genitourinary   Musculoskeletal   Abdominal   Peds  Hematology negative hematology ROS (+)   Anesthesia Other Findings   Reproductive/Obstetrics negative OB ROS                                                              Anesthesia Evaluation  Patient identified by MRN, date of birth, ID band Patient awake    Reviewed: Allergy & Precautions, NPO status , Patient's Chart, lab work & pertinent test results, reviewed documented beta blocker date and time   Airway Mallampati: II  TM Distance: >3 FB Neck ROM: Full    Dental no notable dental hx. (+) Caps, Teeth Intact   Pulmonary former smoker,    Pulmonary exam normal breath sounds clear to auscultation       Cardiovascular hypertension, Pt. on medications and Pt. on home beta blockers + CAD  Normal cardiovascular exam Rhythm:Regular Rate:Normal     Neuro/Psych TIACVA, Residual Symptoms negative psych ROS   GI/Hepatic Neg liver ROS, Hx/o GI bleed from diverticula disease   Endo/Other  diabetes, Well Controlled, Type 2, Oral Hypoglycemic AgentsHyperlipidemia  Renal/GU negative Renal ROS  negative genitourinary   Musculoskeletal  (+) Arthritis , Osteoarthritis,  OA right hip Hx/o melanoma   Abdominal   Peds  Hematology Plavix therapy- last dose 4 days ago   Anesthesia Other Findings   Reproductive/Obstetrics                            Anesthesia Physical Anesthesia Plan  ASA: III  Anesthesia Plan: General   Post-op Pain Management:    Induction: Intravenous  PONV Risk Score and Plan: Ondansetron, Treatment  may vary due to age or medical condition and Dexamethasone  Airway Management Planned: Oral ETT  Additional Equipment:   Intra-op Plan:   Post-operative Plan: Extubation in OR  Informed Consent: I have reviewed the patients History and Physical, chart, labs and discussed the procedure including the risks, benefits and alternatives for the proposed anesthesia with the patient or authorized representative who has indicated his/her understanding and acceptance.     Dental advisory given  Plan  Discussed with: CRNA and Surgeon  Anesthesia Plan Comments: (See APP note by Willeen Cass, FNP)       Anesthesia Quick Evaluation  Anesthesia Physical Anesthesia Plan  ASA: III  Anesthesia Plan: General   Post-op Pain Management:    Induction:   PONV Risk Score and Plan:   Airway Management Planned:   Additional Equipment:   Intra-op Plan:   Post-operative Plan:   Informed Consent: I have reviewed the patients History and Physical, chart, labs and discussed the procedure including the risks, benefits and alternatives for the proposed anesthesia with the patient or authorized representative who has indicated his/her understanding and acceptance.     Dental Advisory Given  Plan Discussed with: CRNA  Anesthesia Plan Comments: (See PAT note 10/10/2018, Konrad Felix, PA-C)       Anesthesia Quick Evaluation

## 2018-10-14 ENCOUNTER — Other Ambulatory Visit: Payer: Self-pay

## 2018-10-14 ENCOUNTER — Other Ambulatory Visit (HOSPITAL_COMMUNITY)
Admission: RE | Admit: 2018-10-14 | Discharge: 2018-10-14 | Disposition: A | Discharge status: Home / Self Care | Payer: Medicare HMO | Source: Ambulatory Visit | Attending: Orthopedic Surgery | Admitting: Orthopedic Surgery

## 2018-10-14 DIAGNOSIS — Z01812 Encounter for preprocedural laboratory examination: Secondary | ICD-10-CM | POA: Insufficient documentation

## 2018-10-14 DIAGNOSIS — Z20828 Contact with and (suspected) exposure to other viral communicable diseases: Secondary | ICD-10-CM | POA: Diagnosis not present

## 2018-10-14 LAB — SARS CORONAVIRUS 2 (TAT 6-24 HRS): SARS Coronavirus 2: NEGATIVE

## 2018-10-17 MED ORDER — BUPIVACAINE LIPOSOME 1.3 % IJ SUSP
10.0000 mL | Freq: Once | INTRAMUSCULAR | Status: DC
  Filled 2018-10-17: qty 10

## 2018-10-17 NOTE — Anesthesia Preprocedure Evaluation (Signed)
Anesthesia Evaluation  Patient identified by MRN, date of birth, ID band Patient awake    Reviewed: Allergy & Precautions, H&P , NPO status , Patient's Chart, lab work & pertinent test results, reviewed documented beta blocker date and time   Airway Mallampati: II  TM Distance: >3 FB Neck ROM: full    Dental no notable dental hx.    Pulmonary neg pulmonary ROS, former smoker,    Pulmonary exam normal breath sounds clear to auscultation       Cardiovascular Exercise Tolerance: Good hypertension, Pt. on medications and Pt. on home beta blockers + CAD   Rhythm:regular Rate:Normal  EKG: 10/10/2018 Rate 68 bpm Sinus rhythm with sinus arrhythmia with 1st degree A-V block Left anterior fasicular block Right bundle branch block Abnormal ECG No significant change since last tracing  CV: Echo 07/12/2018 Interpretation Summary:  A complete two-dimensional transthoracic echocardiogram with color flor Doppler and spectral Doppler was performed.  The left ventricular ejection fraction is normal (60-65%).  There is mild (1+) aortic regurgitation present.    Stress Test 01/22/2016 IMPRESSION: Abnormal cardiac perfusion exam for mild inferolateral ischemia extending towards the apex. Mildly elevated TID .Prognostically this is a intermediate risk scan.   Neuro/Psych IMAGES: US Carotid Bilateral 12/02/2016 IMPRESSION: 1. Diffuse atherosclerotic plaque in the cervical carotids. 2. Elevated right ICA velocities in the lower end of the 50-69% stenotic range. 3. Less than 50% left ICA stenosis. 4. Antegrade flow in both vertebral arteries.   TIACVA negative psych ROS   GI/Hepatic negative GI ROS, Neg liver ROS,   Endo/Other  negative endocrine ROSdiabetes  Renal/GU negative Renal ROS  negative genitourinary   Musculoskeletal   Abdominal   Peds  Hematology negative hematology ROS (+)   Anesthesia Other Findings   Reproductive/Obstetrics negative OB ROS                             Anesthesia Physical Anesthesia Plan  ASA: III  Anesthesia Plan: General   Post-op Pain Management:    Induction: Intravenous  PONV Risk Score and Plan: 2 and Ondansetron, Treatment may vary due to age or medical condition and Midazolam  Airway Management Planned: Oral ETT and LMA  Additional Equipment:   Intra-op Plan:   Post-operative Plan:   Informed Consent: I have reviewed the patients History and Physical, chart, labs and discussed the procedure including the risks, benefits and alternatives for the proposed anesthesia with the patient or authorized representative who has indicated his/her understanding and acceptance.     Dental Advisory Given  Plan Discussed with: CRNA, Anesthesiologist and Surgeon  Anesthesia Plan Comments:         Anesthesia Quick Evaluation

## 2018-10-17 NOTE — Progress Notes (Signed)
Called patient with new surgery time. Patient called and instructed to arrive at Admitting at [place] am. He needs to drink Gatorade G2 drink at 0545 am with nothing to drink after the Gatorade. . Patient also instructed that he will have nasal betadine inserted in his nose for being Positive for Staph . Patient verbalized understanding.

## 2018-10-18 ENCOUNTER — Inpatient Hospital Stay (HOSPITAL_COMMUNITY)
Admission: RE | Admit: 2018-10-18 | Discharge: 2018-10-19 | DRG: 470 | Disposition: A | Discharge status: Home / Self Care | Payer: Medicare HMO | Attending: Orthopedic Surgery | Admitting: Orthopedic Surgery

## 2018-10-18 ENCOUNTER — Inpatient Hospital Stay (HOSPITAL_COMMUNITY): Payer: Medicare HMO | Admitting: Physician Assistant

## 2018-10-18 ENCOUNTER — Inpatient Hospital Stay (HOSPITAL_COMMUNITY): Payer: Medicare HMO | Admitting: Anesthesiology

## 2018-10-18 ENCOUNTER — Other Ambulatory Visit: Payer: Self-pay

## 2018-10-18 ENCOUNTER — Inpatient Hospital Stay (HOSPITAL_COMMUNITY): Payer: Medicare HMO

## 2018-10-18 ENCOUNTER — Encounter (HOSPITAL_COMMUNITY): Payer: Self-pay | Admitting: Anesthesiology

## 2018-10-18 ENCOUNTER — Encounter (HOSPITAL_COMMUNITY)
Admission: RE | Disposition: A | Discharge status: Home / Self Care | Payer: Self-pay | Source: Home / Self Care | Attending: Orthopedic Surgery

## 2018-10-18 DIAGNOSIS — Z96641 Presence of right artificial hip joint: Secondary | ICD-10-CM | POA: Diagnosis present

## 2018-10-18 DIAGNOSIS — I10 Essential (primary) hypertension: Secondary | ICD-10-CM | POA: Diagnosis not present

## 2018-10-18 DIAGNOSIS — I452 Bifascicular block: Secondary | ICD-10-CM | POA: Diagnosis not present

## 2018-10-18 DIAGNOSIS — Z7902 Long term (current) use of antithrombotics/antiplatelets: Secondary | ICD-10-CM

## 2018-10-18 DIAGNOSIS — Z85820 Personal history of malignant melanoma of skin: Secondary | ICD-10-CM

## 2018-10-18 DIAGNOSIS — Z981 Arthrodesis status: Secondary | ICD-10-CM | POA: Diagnosis not present

## 2018-10-18 DIAGNOSIS — E782 Mixed hyperlipidemia: Secondary | ICD-10-CM

## 2018-10-18 DIAGNOSIS — Z79899 Other long term (current) drug therapy: Secondary | ICD-10-CM

## 2018-10-18 DIAGNOSIS — M1610 Unilateral primary osteoarthritis, unspecified hip: Secondary | ICD-10-CM | POA: Diagnosis present

## 2018-10-18 DIAGNOSIS — Z8249 Family history of ischemic heart disease and other diseases of the circulatory system: Secondary | ICD-10-CM | POA: Diagnosis not present

## 2018-10-18 DIAGNOSIS — I440 Atrioventricular block, first degree: Secondary | ICD-10-CM | POA: Diagnosis present

## 2018-10-18 DIAGNOSIS — Z419 Encounter for procedure for purposes other than remedying health state, unspecified: Secondary | ICD-10-CM

## 2018-10-18 DIAGNOSIS — Z87891 Personal history of nicotine dependence: Secondary | ICD-10-CM

## 2018-10-18 DIAGNOSIS — E785 Hyperlipidemia, unspecified: Secondary | ICD-10-CM | POA: Diagnosis not present

## 2018-10-18 DIAGNOSIS — I2510 Atherosclerotic heart disease of native coronary artery without angina pectoris: Secondary | ICD-10-CM | POA: Diagnosis not present

## 2018-10-18 DIAGNOSIS — Z7984 Long term (current) use of oral hypoglycemic drugs: Secondary | ICD-10-CM

## 2018-10-18 DIAGNOSIS — M1612 Unilateral primary osteoarthritis, left hip: Principal | ICD-10-CM | POA: Diagnosis present

## 2018-10-18 DIAGNOSIS — Z8673 Personal history of transient ischemic attack (TIA), and cerebral infarction without residual deficits: Secondary | ICD-10-CM | POA: Diagnosis not present

## 2018-10-18 DIAGNOSIS — I639 Cerebral infarction, unspecified: Secondary | ICD-10-CM | POA: Diagnosis present

## 2018-10-18 DIAGNOSIS — Z7982 Long term (current) use of aspirin: Secondary | ICD-10-CM | POA: Diagnosis not present

## 2018-10-18 DIAGNOSIS — Z79890 Hormone replacement therapy (postmenopausal): Secondary | ICD-10-CM | POA: Diagnosis not present

## 2018-10-18 DIAGNOSIS — E1140 Type 2 diabetes mellitus with diabetic neuropathy, unspecified: Secondary | ICD-10-CM

## 2018-10-18 DIAGNOSIS — G459 Transient cerebral ischemic attack, unspecified: Secondary | ICD-10-CM | POA: Diagnosis present

## 2018-10-18 DIAGNOSIS — Z471 Aftercare following joint replacement surgery: Secondary | ICD-10-CM | POA: Diagnosis not present

## 2018-10-18 DIAGNOSIS — Z825 Family history of asthma and other chronic lower respiratory diseases: Secondary | ICD-10-CM | POA: Diagnosis not present

## 2018-10-18 DIAGNOSIS — Z96642 Presence of left artificial hip joint: Secondary | ICD-10-CM | POA: Diagnosis not present

## 2018-10-18 DIAGNOSIS — E119 Type 2 diabetes mellitus without complications: Secondary | ICD-10-CM | POA: Diagnosis present

## 2018-10-18 HISTORY — PX: TOTAL HIP ARTHROPLASTY: SHX124

## 2018-10-18 LAB — SAMPLE TO BLOOD BANK

## 2018-10-18 LAB — GLUCOSE, CAPILLARY
Glucose-Capillary: 132 mg/dL — ABNORMAL HIGH (ref 70–99)
Glucose-Capillary: 163 mg/dL — ABNORMAL HIGH (ref 70–99)

## 2018-10-18 SURGERY — ARTHROPLASTY, HIP, TOTAL, ANTERIOR APPROACH
Anesthesia: General | Site: Hip | Laterality: Left

## 2018-10-18 MED ORDER — EPHEDRINE 5 MG/ML INJ
INTRAVENOUS | Status: AC
  Filled 2018-10-18: qty 10

## 2018-10-18 MED ORDER — GABAPENTIN 100 MG PO CAPS
100.0000 mg | ORAL_CAPSULE | Freq: Two times a day (BID) | ORAL | Status: DC
  Administered 2018-10-18 – 2018-10-19 (×3): 100 mg via ORAL
  Filled 2018-10-18 (×3): qty 1

## 2018-10-18 MED ORDER — ASPIRIN EC 81 MG PO TBEC: 81.0000 mg | DELAYED_RELEASE_TABLET | Freq: Two times a day (BID) | ORAL | 0 refills | Status: AC

## 2018-10-18 MED ORDER — EPHEDRINE SULFATE-NACL 50-0.9 MG/10ML-% IV SOSY
PREFILLED_SYRINGE | INTRAVENOUS | Status: DC | PRN
  Administered 2018-10-18 (×6): 5 mg via INTRAVENOUS

## 2018-10-18 MED ORDER — METOCLOPRAMIDE HCL 5 MG PO TABS: 5.0000 mg | ORAL_TABLET | Freq: Three times a day (TID) | ORAL | Status: DC | PRN

## 2018-10-18 MED ORDER — SUGAMMADEX SODIUM 200 MG/2ML IV SOLN
INTRAVENOUS | Status: DC | PRN
  Administered 2018-10-18: 190 mg via INTRAVENOUS

## 2018-10-18 MED ORDER — DIPHENHYDRAMINE HCL 12.5 MG/5ML PO ELIX: 12.5000 mg | ORAL_SOLUTION | ORAL | Status: DC | PRN

## 2018-10-18 MED ORDER — FENTANYL CITRATE (PF) 100 MCG/2ML IJ SOLN: 25.0000 ug | INTRAMUSCULAR | Status: DC | PRN

## 2018-10-18 MED ORDER — SORBITOL 70 % SOLN
30.0000 mL | Freq: Every day | Status: DC | PRN
  Filled 2018-10-18 (×2): qty 30

## 2018-10-18 MED ORDER — ALBUMIN HUMAN 5 % IV SOLN
INTRAVENOUS | Status: DC | PRN
  Administered 2018-10-18: 10:00:00 via INTRAVENOUS

## 2018-10-18 MED ORDER — SODIUM CHLORIDE FLUSH 0.9 % IV SOLN
INTRAVENOUS | Status: DC | PRN
  Administered 2018-10-18: 20 mL

## 2018-10-18 MED ORDER — ACETAMINOPHEN 500 MG PO TABS
1000.0000 mg | ORAL_TABLET | Freq: Three times a day (TID) | ORAL | Status: DC
  Administered 2018-10-18 (×2): 1000 mg via ORAL
  Filled 2018-10-18 (×3): qty 2

## 2018-10-18 MED ORDER — ROPINIROLE HCL 1 MG PO TABS
1.0000 mg | ORAL_TABLET | Freq: Every day | ORAL | Status: DC
  Administered 2018-10-18: 1 mg via ORAL
  Filled 2018-10-18: qty 1

## 2018-10-18 MED ORDER — ESMOLOL HCL 100 MG/10ML IV SOLN
INTRAVENOUS | Status: AC
  Filled 2018-10-18: qty 10

## 2018-10-18 MED ORDER — DOCUSATE SODIUM 100 MG PO CAPS
100.0000 mg | ORAL_CAPSULE | Freq: Two times a day (BID) | ORAL | Status: DC
  Administered 2018-10-18 – 2018-10-19 (×2): 100 mg via ORAL
  Filled 2018-10-18 (×2): qty 1

## 2018-10-18 MED ORDER — SPIRONOLACTONE 25 MG PO TABS
25.0000 mg | ORAL_TABLET | Freq: Every evening | ORAL | Status: DC
  Administered 2018-10-18: 25 mg via ORAL
  Filled 2018-10-18: qty 1

## 2018-10-18 MED ORDER — LACTATED RINGERS IV SOLN
INTRAVENOUS | Status: DC
  Administered 2018-10-18 (×2): via INTRAVENOUS

## 2018-10-18 MED ORDER — NITROGLYCERIN 0.4 MG SL SUBL: 0.4000 mg | SUBLINGUAL_TABLET | SUBLINGUAL | Status: DC | PRN

## 2018-10-18 MED ORDER — ONDANSETRON HCL 4 MG/2ML IJ SOLN
INTRAMUSCULAR | Status: DC | PRN
  Administered 2018-10-18: 4 mg via INTRAVENOUS

## 2018-10-18 MED ORDER — LIDOCAINE 2% (20 MG/ML) 5 ML SYRINGE
INTRAMUSCULAR | Status: DC | PRN
  Administered 2018-10-18: 100 mg via INTRAVENOUS

## 2018-10-18 MED ORDER — HYDROCODONE-ACETAMINOPHEN 5-325 MG PO TABS
1.0000 | ORAL_TABLET | Freq: Four times a day (QID) | ORAL | Status: DC | PRN
  Administered 2018-10-18 – 2018-10-19 (×3): 1 via ORAL
  Filled 2018-10-18: qty 1
  Filled 2018-10-18: qty 2

## 2018-10-18 MED ORDER — DEXAMETHASONE SODIUM PHOSPHATE 10 MG/ML IJ SOLN
10.0000 mg | Freq: Once | INTRAMUSCULAR | Status: AC
  Administered 2018-10-19: 10 mg via INTRAVENOUS
  Filled 2018-10-18: qty 1

## 2018-10-18 MED ORDER — ACETAMINOPHEN 500 MG PO TABS
1000.0000 mg | ORAL_TABLET | Freq: Once | ORAL | Status: AC
  Administered 2018-10-18: 1000 mg via ORAL
  Filled 2018-10-18: qty 2

## 2018-10-18 MED ORDER — TRIAMTERENE-HCTZ 37.5-25 MG PO TABS
1.0000 | ORAL_TABLET | Freq: Every day | ORAL | Status: DC
  Administered 2018-10-18 – 2018-10-19 (×2): 1 via ORAL
  Filled 2018-10-18 (×2): qty 1

## 2018-10-18 MED ORDER — OXYCODONE HCL 5 MG/5ML PO SOLN: 5.0000 mg | Freq: Once | ORAL | Status: DC | PRN

## 2018-10-18 MED ORDER — ROCURONIUM BROMIDE 10 MG/ML (PF) SYRINGE
PREFILLED_SYRINGE | INTRAVENOUS | Status: AC
  Filled 2018-10-18: qty 20

## 2018-10-18 MED ORDER — ALBUMIN HUMAN 5 % IV SOLN
INTRAVENOUS | Status: AC
  Filled 2018-10-18: qty 250

## 2018-10-18 MED ORDER — ESMOLOL HCL 100 MG/10ML IV SOLN
INTRAVENOUS | Status: DC | PRN
  Administered 2018-10-18 (×4): 10 mg via INTRAVENOUS

## 2018-10-18 MED ORDER — PHENOL 1.4 % MT LIQD
1.0000 | OROMUCOSAL | Status: DC | PRN
  Filled 2018-10-18: qty 177

## 2018-10-18 MED ORDER — METOPROLOL SUCCINATE ER 25 MG PO TB24
25.0000 mg | ORAL_TABLET | Freq: Every evening | ORAL | Status: DC
  Administered 2018-10-18: 25 mg via ORAL
  Filled 2018-10-18: qty 1

## 2018-10-18 MED ORDER — CEFAZOLIN SODIUM-DEXTROSE 2-4 GM/100ML-% IV SOLN
2.0000 g | INTRAVENOUS | Status: AC
  Administered 2018-10-18: 2 g via INTRAVENOUS
  Filled 2018-10-18: qty 100

## 2018-10-18 MED ORDER — FENTANYL CITRATE (PF) 100 MCG/2ML IJ SOLN
INTRAMUSCULAR | Status: DC | PRN
  Administered 2018-10-18 (×5): 50 ug via INTRAVENOUS

## 2018-10-18 MED ORDER — METHOCARBAMOL 500 MG IVPB - SIMPLE MED
500.0000 mg | Freq: Four times a day (QID) | INTRAVENOUS | Status: DC | PRN
  Filled 2018-10-18: qty 50

## 2018-10-18 MED ORDER — PROPOFOL 10 MG/ML IV BOLUS
INTRAVENOUS | Status: DC | PRN
  Administered 2018-10-18: 130 mg via INTRAVENOUS

## 2018-10-18 MED ORDER — TRANEXAMIC ACID-NACL 1000-0.7 MG/100ML-% IV SOLN
1000.0000 mg | INTRAVENOUS | Status: AC
  Administered 2018-10-18: 1000 mg via INTRAVENOUS
  Filled 2018-10-18: qty 100

## 2018-10-18 MED ORDER — LISINOPRIL 20 MG PO TABS
40.0000 mg | ORAL_TABLET | Freq: Every day | ORAL | Status: DC
  Administered 2018-10-18 – 2018-10-19 (×2): 40 mg via ORAL
  Filled 2018-10-18 (×2): qty 2

## 2018-10-18 MED ORDER — PROPOFOL 10 MG/ML IV BOLUS
INTRAVENOUS | Status: AC
  Filled 2018-10-18: qty 40

## 2018-10-18 MED ORDER — DEXAMETHASONE SODIUM PHOSPHATE 10 MG/ML IJ SOLN
INTRAMUSCULAR | Status: AC
  Filled 2018-10-18: qty 2

## 2018-10-18 MED ORDER — HYDROMORPHONE HCL 1 MG/ML IJ SOLN: 0.2500 mg | INTRAMUSCULAR | Status: DC | PRN

## 2018-10-18 MED ORDER — SODIUM CHLORIDE (PF) 0.9 % IJ SOLN
INTRAMUSCULAR | Status: AC
  Filled 2018-10-18: qty 20

## 2018-10-18 MED ORDER — FENTANYL CITRATE (PF) 100 MCG/2ML IJ SOLN
INTRAMUSCULAR | Status: AC
  Filled 2018-10-18: qty 2

## 2018-10-18 MED ORDER — LEVOTHYROXINE SODIUM 50 MCG PO TABS
50.0000 ug | ORAL_TABLET | Freq: Every day | ORAL | Status: DC
  Administered 2018-10-19: 50 ug via ORAL
  Filled 2018-10-18: qty 1

## 2018-10-18 MED ORDER — DEXAMETHASONE SODIUM PHOSPHATE 10 MG/ML IJ SOLN
INTRAMUSCULAR | Status: DC | PRN
  Administered 2018-10-18: 5 mg via INTRAVENOUS

## 2018-10-18 MED ORDER — CLOPIDOGREL BISULFATE 75 MG PO TABS
75.0000 mg | ORAL_TABLET | Freq: Every day | ORAL | Status: DC
  Administered 2018-10-19: 75 mg via ORAL
  Filled 2018-10-18: qty 1

## 2018-10-18 MED ORDER — WATER FOR IRRIGATION, STERILE IR SOLN
Status: DC | PRN
  Administered 2018-10-18: 2000 mL

## 2018-10-18 MED ORDER — MENTHOL 3 MG MT LOZG: 1.0000 | LOZENGE | OROMUCOSAL | Status: DC | PRN

## 2018-10-18 MED ORDER — ISOSORBIDE MONONITRATE ER 30 MG PO TB24
30.0000 mg | ORAL_TABLET | Freq: Every evening | ORAL | Status: DC
  Administered 2018-10-18: 18:00:00 30 mg via ORAL
  Filled 2018-10-18: qty 1

## 2018-10-18 MED ORDER — ONDANSETRON HCL 4 MG PO TABS: 4.0000 mg | ORAL_TABLET | Freq: Four times a day (QID) | ORAL | Status: DC | PRN

## 2018-10-18 MED ORDER — ACETAMINOPHEN 160 MG/5ML PO SOLN: 325.0000 mg | ORAL | Status: DC | PRN

## 2018-10-18 MED ORDER — MAGNESIUM CITRATE PO SOLN: 1.0000 | Freq: Once | ORAL | Status: DC | PRN

## 2018-10-18 MED ORDER — ACETAMINOPHEN 325 MG PO TABS: 325.0000 mg | ORAL_TABLET | ORAL | Status: DC | PRN

## 2018-10-18 MED ORDER — PRAVASTATIN SODIUM 20 MG PO TABS
10.0000 mg | ORAL_TABLET | Freq: Every evening | ORAL | Status: DC
  Administered 2018-10-18: 10 mg via ORAL
  Filled 2018-10-18: qty 1

## 2018-10-18 MED ORDER — ROCURONIUM BROMIDE 10 MG/ML (PF) SYRINGE
PREFILLED_SYRINGE | INTRAVENOUS | Status: DC | PRN
  Administered 2018-10-18: 60 mg via INTRAVENOUS

## 2018-10-18 MED ORDER — BUPIVACAINE LIPOSOME 1.3 % IJ SUSP
INTRAMUSCULAR | Status: DC | PRN
  Administered 2018-10-18: 10 mL

## 2018-10-18 MED ORDER — METOCLOPRAMIDE HCL 5 MG/ML IJ SOLN: 5.0000 mg | Freq: Three times a day (TID) | INTRAMUSCULAR | Status: DC | PRN

## 2018-10-18 MED ORDER — MEPERIDINE HCL 50 MG/ML IJ SOLN: 6.2500 mg | INTRAMUSCULAR | Status: DC | PRN

## 2018-10-18 MED ORDER — ESCITALOPRAM OXALATE 10 MG PO TABS
10.0000 mg | ORAL_TABLET | Freq: Every day | ORAL | Status: DC
  Administered 2018-10-19: 10 mg via ORAL
  Filled 2018-10-18: qty 1

## 2018-10-18 MED ORDER — POLYETHYLENE GLYCOL 3350 17 G PO PACK: 17.0000 g | PACK | Freq: Every day | ORAL | Status: DC | PRN

## 2018-10-18 MED ORDER — ONDANSETRON HCL 4 MG/2ML IJ SOLN: 4.0000 mg | Freq: Four times a day (QID) | INTRAMUSCULAR | Status: DC | PRN

## 2018-10-18 MED ORDER — LORATADINE 10 MG PO TABS
10.0000 mg | ORAL_TABLET | Freq: Every day | ORAL | Status: DC
  Administered 2018-10-18 – 2018-10-19 (×2): 10 mg via ORAL
  Filled 2018-10-18 (×2): qty 1

## 2018-10-18 MED ORDER — ASPIRIN 81 MG PO CHEW
81.0000 mg | CHEWABLE_TABLET | Freq: Two times a day (BID) | ORAL | Status: DC
  Administered 2018-10-18 – 2018-10-19 (×2): 81 mg via ORAL
  Filled 2018-10-18 (×2): qty 1

## 2018-10-18 MED ORDER — OXYCODONE HCL 5 MG PO TABS: 5.0000 mg | ORAL_TABLET | Freq: Once | ORAL | Status: DC | PRN

## 2018-10-18 MED ORDER — METFORMIN HCL 500 MG PO TABS
500.0000 mg | ORAL_TABLET | Freq: Two times a day (BID) | ORAL | Status: DC
  Administered 2018-10-18 – 2018-10-19 (×2): 500 mg via ORAL
  Filled 2018-10-18 (×2): qty 1

## 2018-10-18 MED ORDER — CHLORHEXIDINE GLUCONATE 4 % EX LIQD: 60.0000 mL | Freq: Once | CUTANEOUS | Status: DC

## 2018-10-18 MED ORDER — ACETAMINOPHEN 325 MG PO TABS: 325.0000 mg | ORAL_TABLET | Freq: Four times a day (QID) | ORAL | Status: DC | PRN

## 2018-10-18 MED ORDER — CEFAZOLIN SODIUM-DEXTROSE 1-4 GM/50ML-% IV SOLN
1.0000 g | Freq: Four times a day (QID) | INTRAVENOUS | Status: AC
  Administered 2018-10-18 (×2): 1 g via INTRAVENOUS
  Filled 2018-10-18 (×2): qty 50

## 2018-10-18 MED ORDER — MIRABEGRON ER 25 MG PO TB24
50.0000 mg | ORAL_TABLET | Freq: Every day | ORAL | Status: DC
  Administered 2018-10-19: 10:00:00 50 mg via ORAL
  Filled 2018-10-18: qty 2

## 2018-10-18 MED ORDER — ONDANSETRON HCL 4 MG/2ML IJ SOLN: 4.0000 mg | Freq: Once | INTRAMUSCULAR | Status: DC | PRN

## 2018-10-18 MED ORDER — LEVOTHYROXINE SODIUM 50 MCG PO TABS
50.0000 ug | ORAL_TABLET | Freq: Every evening | ORAL | Status: DC
  Filled 2018-10-18: qty 1

## 2018-10-18 MED ORDER — LIDOCAINE 2% (20 MG/ML) 5 ML SYRINGE
INTRAMUSCULAR | Status: AC
  Filled 2018-10-18: qty 10

## 2018-10-18 MED ORDER — ONDANSETRON HCL 4 MG/2ML IJ SOLN
INTRAMUSCULAR | Status: AC
  Filled 2018-10-18: qty 4

## 2018-10-18 MED ORDER — 0.9 % SODIUM CHLORIDE (POUR BTL) OPTIME
TOPICAL | Status: DC | PRN
  Administered 2018-10-18: 09:00:00 1000 mL

## 2018-10-18 MED ORDER — METHOCARBAMOL 500 MG PO TABS
500.0000 mg | ORAL_TABLET | Freq: Four times a day (QID) | ORAL | Status: DC | PRN
  Administered 2018-10-18 – 2018-10-19 (×3): 500 mg via ORAL
  Filled 2018-10-18 (×3): qty 1

## 2018-10-18 MED ORDER — POVIDONE-IODINE 10 % EX SWAB
2.0000 "application " | Freq: Once | CUTANEOUS | Status: AC
  Administered 2018-10-18: 2 via TOPICAL

## 2018-10-18 MED ORDER — LACTATED RINGERS IV SOLN
INTRAVENOUS | Status: DC
  Administered 2018-10-18: 13:00:00 via INTRAVENOUS

## 2018-10-18 SURGICAL SUPPLY — 38 items
APL PRP STRL LF DISP 70% ISPRP (MISCELLANEOUS) ×1
BLADE SAG 18X100X1.27 (BLADE) IMPLANT
BLADE SURG SZ10 CARB STEEL (BLADE) ×4 IMPLANT
CHLORAPREP W/TINT 26 (MISCELLANEOUS) ×2 IMPLANT
CLSR STERI-STRIP ANTIMIC 1/2X4 (GAUZE/BANDAGES/DRESSINGS) ×2 IMPLANT
COVER PERINEAL POST (MISCELLANEOUS) ×2 IMPLANT
COVER SURGICAL LIGHT HANDLE (MISCELLANEOUS) ×2 IMPLANT
COVER WAND RF STERILE (DRAPES) ×1 IMPLANT
DECANTER SPIKE VIAL GLASS SM (MISCELLANEOUS) ×4 IMPLANT
DRAPE IMP U-DRAPE 54X76 (DRAPES) ×2 IMPLANT
DRAPE STERI IOBAN 125X83 (DRAPES) ×2 IMPLANT
DRAPE U-SHAPE 47X51 STRL (DRAPES) ×4 IMPLANT
DRSG MEPILEX BORDER 4X8 (GAUZE/BANDAGES/DRESSINGS) ×2 IMPLANT
ELECT BLADE TIP CTD 4 INCH (ELECTRODE) ×2 IMPLANT
GLOVE BIO SURGEON STRL SZ7.5 (GLOVE) ×4 IMPLANT
GLOVE BIOGEL PI IND STRL 8 (GLOVE) ×2 IMPLANT
GLOVE BIOGEL PI INDICATOR 8 (GLOVE) ×2
GOWN STRL REUS W/TWL LRG LVL3 (GOWN DISPOSABLE) ×2 IMPLANT
GOWN STRL REUS W/TWL XL LVL3 (GOWN DISPOSABLE) ×2 IMPLANT
HEAD FEMORAL 36MM (Hips) ×1 IMPLANT
INSERT POLYETHYLENE 36M-0 (Insert) ×1 IMPLANT
KIT TURNOVER KIT A (KITS) IMPLANT
MANIFOLD NEPTUNE II (INSTRUMENTS) ×2 IMPLANT
NS IRRIG 1000ML POUR BTL (IV SOLUTION) ×2 IMPLANT
PACK ANTERIOR HIP CUSTOM (KITS) ×2 IMPLANT
PROTECTOR NERVE ULNAR (MISCELLANEOUS) ×2 IMPLANT
SCREW HEX LP 6.5X20 (Screw) ×1 IMPLANT
SHELL TRIDENT II CLUST 50 (Shell) ×1 IMPLANT
STEM ACCOLADE SZ 6 (Hips) ×1 IMPLANT
SUT MNCRL AB 4-0 PS2 18 (SUTURE) ×2 IMPLANT
SUT STRATAFIX 0 PDS 27 VIOLET (SUTURE) ×2
SUT VIC AB 0 CT1 36 (SUTURE) ×2 IMPLANT
SUT VIC AB 1 CT1 36 (SUTURE) ×2 IMPLANT
SUT VIC AB 2-0 CT1 27 (SUTURE) ×4
SUT VIC AB 2-0 CT1 TAPERPNT 27 (SUTURE) ×2 IMPLANT
SUTURE STRATFX 0 PDS 27 VIOLET (SUTURE) ×1 IMPLANT
WATER STERILE IRR 1000ML POUR (IV SOLUTION) ×4 IMPLANT
YANKAUER SUCT BULB TIP 10FT TU (MISCELLANEOUS) ×2 IMPLANT

## 2018-10-18 NOTE — Op Note (Signed)
10/18/2018  10:05 AM  PATIENT:  Steve Ramirez   MRN: [id number]  PRE-OPERATIVE DIAGNOSIS:  OA LEFT HIP  POST-OPERATIVE DIAGNOSIS:  OA LEFT HIP  PROCEDURE:  Procedure(s): TOTAL HIP ARTHROPLASTY ANTERIOR APPROACH  PREOPERATIVE INDICATIONS:    Steve Ramirez is an 83 y.o. male who has a diagnosis of Primary osteoarthritis of left hip and elected for surgical management after failing conservative treatment.  The risks benefits and alternatives were discussed with the patient including but not limited to the risks of nonoperative treatment, versus surgical intervention including infection, bleeding, nerve injury, periprosthetic fracture, the need for revision surgery, dislocation, leg length discrepancy, blood clots, cardiopulmonary complications, morbidity, mortality, among others, and they were willing to proceed.     OPERATIVE REPORT     SURGEON:   Renette Butters, MD    ASSISTANT:  Roxan Hockey, PA-C, he was present and scrubbed throughout the case, critical for completion in a timely fashion, and for retraction, instrumentation, and closure.     ANESTHESIA:  General    COMPLICATIONS:  None.     COMPONENTS:  [employer] acolade fit femur size 6 with a 36 mm -0 head ball and a acetabular shell size 50 with a  polyethylene liner    PROCEDURE IN DETAIL:   The patient was met in the holding area and  identified.  The appropriate hip was identified and marked at the operative site.  The patient was then transported to the OR  and  placed under anesthesia per that record.  At that point, the patient was  placed in the supine position and  secured to the operating room table and all bony prominences padded. He received pre-operative antibiotics    The operative lower extremity was prepped from the iliac crest to the distal leg.  Sterile draping was performed.  Time out was performed prior to incision.      Skin incision was made just 2 cm lateral to the ASIS  extending in line  with the tensor fascia lata. Electrocautery was used to control all bleeders. I dissected down sharply to the fascia of the tensor fascia lata was confirmed that the muscle fibers beneath were running posteriorly. I then incised the fascia over the superficial tensor fascia lata in line with the incision. The fascia was elevated off the anterior aspect of the muscle the muscle was retracted posteriorly and protected throughout the case. I then used electrocautery to incise the tensor fascia lata fascia control and all bleeders. Immediately visible was the fat over top of the anterior neck and capsule.  I removed the anterior fat from the capsule and elevated the rectus muscle off of the anterior capsule. I then removed a large time of capsule. The retractors were then placed over the anterior acetabulum as well as around the superior and inferior neck.  I then made a femoral neck cut. Then used the power corkscrew to remove the femoral head from the acetabulum and thoroughly irrigated the acetabulum. I sized the femoral head.    I then exposed the deep acetabulum, cleared out any tissue including the ligamentum teres.   After adequate visualization, I excised the labrum, and then sequentially reamed.  I then impacted the acetabular implant into place using fluoroscopy for guidance.  Appropriate version and inclination was confirmed clinically matching their bony anatomy, and with fluoroscopy.  I placed a 20 mm screw in the posterior/superio position with an excellent bite.    I then placed the polyethylene liner  in place  I then adducted the leg and released the external rotators from the posterior femur allowing it to be easily delivered up lateral and anterior to the acetabulum for preparation of the femoral canal.    I then prepared the proximal femur using the cookie-cutter and then sequentially reamed and broached.  A trial broach, neck, and head was utilized, and I reduced the hip and used  floroscopy to assess the neck length and femoral implant.  I then impacted the femoral prosthesis into place into the appropriate version. The hip was then reduced and fluoroscopy confirmed appropriate position. Leg lengths were restored.  I then irrigated the hip copiously again with, and repaired the fascia with Vicryl, followed by monocryl for the subcutaneous tissue, Monocryl for the skin, Steri-Strips and sterile gauze. The patient was then awakened and returned to PACU in stable and satisfactory condition. There were no complications.  POST OPERATIVE PLAN: WBAT, DVT px: SCD's/TED, ambulation and chemical dvt px  Steve Lynch, MD Orthopedic Surgeon [phone]

## 2018-10-18 NOTE — Plan of Care (Signed)

## 2018-10-18 NOTE — Anesthesia Procedure Notes (Signed)
Procedure Name: Intubation Date/Time: 10/18/2018 8:57 AM Performed by: Lavina Hamman, CRNA Pre-anesthesia Checklist: Patient identified, Emergency Drugs available, Suction available, Patient being monitored and Timeout performed Patient Re-evaluated:Patient Re-evaluated prior to induction Oxygen Delivery Method: Circle system utilized Preoxygenation: Pre-oxygenation with 100% oxygen Induction Type: IV induction Ventilation: Mask ventilation without difficulty Laryngoscope Size: Mac and 4 Grade View: Grade II Tube type: Oral Tube size: 7.5 mm Number of attempts: 1 Airway Equipment and Method: Stylet Placement Confirmation: ETT inserted through vocal cords under direct vision,  positive ETCO2,  CO2 detector and breath sounds checked- equal and bilateral Secured at: 22 cm Tube secured with: Tape Dental Injury: Teeth and Oropharynx as per pre-operative assessment

## 2018-10-18 NOTE — Transfer of Care (Signed)
Immediate Anesthesia Transfer of Care Note  Patient: Steve Ramirez  Procedure(s) Performed: Procedure(s): TOTAL HIP ARTHROPLASTY ANTERIOR APPROACH (Left)  Patient Location: PACU  Anesthesia Type:General  Level of Consciousness:  sedated, patient cooperative and responds to stimulation  Airway & Oxygen Therapy:Patient Spontanous Breathing and Patient connected to face mask oxgen  Post-op Assessment:  Report given to PACU RN and Post -op Vital signs reviewed and stable  Post vital signs:  Reviewed and stable  Last Vitals:  Vitals:   10/18/18 0622 10/18/18 1037  BP: (!) 163/78 (!) 126/51  Pulse: 60 75  Resp: 18 10  Temp: 36.8 C 36.9 C  SpO2: [id number]    Complications: No apparent anesthesia complications

## 2018-10-18 NOTE — Anesthesia Postprocedure Evaluation (Signed)
Anesthesia Post Note  Patient: Steve Ramirez  Procedure(s) Performed: TOTAL HIP ARTHROPLASTY ANTERIOR APPROACH (Left Hip)     Patient location during evaluation: PACU Anesthesia Type: General Level of consciousness: awake and alert Pain management: pain level controlled Vital Signs Assessment: post-procedure vital signs reviewed and stable Respiratory status: spontaneous breathing, nonlabored ventilation, respiratory function stable and patient connected to nasal cannula oxygen Cardiovascular status: blood pressure returned to baseline and stable Postop Assessment: no apparent nausea or vomiting Anesthetic complications: no    Last Vitals:  Vitals:   10/18/18 1551 10/18/18 1652  BP: 122/76 (!) 141/65  Pulse: 75 90  Resp: 16 16  Temp: 36.6 C 36.6 C  SpO2: 94% 97%    Last Pain:  Vitals:   10/18/18 1306  TempSrc: Oral  PainSc: 0-No pain                 Bexley Mclester

## 2018-10-18 NOTE — Discharge Instructions (Signed)
You may bear weight as tolerated. °Keep your dressing on and dry until follow up. °Take medicine to prevent blood clots as directed. °Take pain medicine as needed with the goal of transitioning to over the counter medicines.  ° °INSTRUCTIONS AFTER JOINT REPLACEMENT  ° °o Remove items at home which could result in a fall. This includes throw rugs or furniture in walking pathways °o ICE to the affected joint every three hours while awake for 30 minutes at a time, for at least the first 3-5 days, and then as needed for pain and swelling.  Continue to use ice for pain and swelling. You may notice swelling that will progress down to the foot and ankle.  This is normal after surgery.  Elevate your leg when you are not up walking on it.   °o Continue to use the breathing machine you got in the hospital (incentive spirometer) which will help keep your temperature down.  It is common for your temperature to cycle up and down following surgery, especially at night when you are not up moving around and exerting yourself.  The breathing machine keeps your lungs expanded and your temperature down. ° ° °DIET:  As you were doing prior to hospitalization, we recommend a well-balanced diet. ° °DRESSING / WOUND CARE / SHOWERING ° °You may shower 3 days after surgery, but keep the wounds dry during showering.  You may use an occlusive plastic wrap (Press'n Seal for example) with blue painter's tape at edges, NO SOAKING/SUBMERGING IN THE BATHTUB.  If the bandage gets wet, change with a clean dry gauze.  If the incision gets wet, pat the wound dry with a clean towel. ° °ACTIVITY ° °o Increase activity slowly as tolerated, but follow the weight bearing instructions below.   °o No driving for 6 weeks or until further direction given by your physician.  You cannot drive while taking narcotics.  °o No lifting or carrying greater than 10 lbs. until further directed by your surgeon. °o Avoid periods of inactivity such as sitting longer than  an hour when not asleep. This helps prevent blood clots.  °o You may return to work once you are authorized by your doctor.  ° ° ° °WEIGHT BEARING  ° °Weight bearing as tolerated with assist device (walker, cane, etc) as directed, use it as long as suggested by your surgeon or therapist, typically at least 4-6 weeks. ° ° °EXERCISES ° °Results after joint replacement surgery are often greatly improved when you follow the exercise, range of motion and muscle strengthening exercises prescribed by your doctor. Safety measures are also important to protect the joint from further injury. Any time any of these exercises cause you to have increased pain or swelling, decrease what you are doing until you are comfortable again and then slowly increase them. If you have problems or questions, call your caregiver or physical therapist for advice.  ° °Rehabilitation is important following a joint replacement. After just a few days of immobilization, the muscles of the leg can become weakened and shrink (atrophy).  These exercises are designed to build up the tone and strength of the thigh and leg muscles and to improve motion. Often times heat used for twenty to thirty minutes before working out will loosen up your tissues and help with improving the range of motion but do not use heat for the first two weeks following surgery (sometimes heat can increase post-operative swelling).  ° °These exercises can be done on a training (exercise)   exercise) mat, on the floor, on a table or on a bed. Use whatever works the best and is most comfortable for you.    Use music or television while you are exercising so that the exercises are a pleasant break in your day. This will make your life better with the exercises acting as a break in your routine that you can look forward to.   Perform all exercises about fifteen times, three times per day or as directed.  You should exercise both the operative leg and the other leg as well.  Exercises  include:    Quad Sets - Tighten up the muscle on the front of the thigh (Quad) and hold for 5-10 seconds.    Straight Leg Raises - With your knee straight (if you were given a brace, keep it on), lift the leg to 60 degrees, hold for 3 seconds, and slowly lower the leg.  Perform this exercise against resistance later as your leg gets stronger.   Leg Slides: Lying on your back, slowly slide your foot toward your buttocks, bending your knee up off the floor (only go as far as is comfortable). Then slowly slide your foot back down until your leg is flat on the floor again.   Angel Wings: Lying on your back spread your legs to the side as far apart as you can without causing discomfort.   Hamstring Strength:  Lying on your back, push your heel against the floor with your leg straight by tightening up the muscles of your buttocks.  Repeat, but this time bend your knee to a comfortable angle, and push your heel against the floor.  You may put a pillow under the heel to make it more comfortable if necessary.   A rehabilitation program following joint replacement surgery can speed recovery and prevent re-injury in the future due to weakened muscles. Contact your doctor or a physical therapist for more information on knee rehabilitation.    CONSTIPATION  Constipation is defined medically as fewer than three stools per week and severe constipation as less than one stool per week.  Even if you have a regular bowel pattern at home, your normal regimen is likely to be disrupted due to multiple reasons following surgery.  Combination of anesthesia, postoperative narcotics, change in appetite and fluid intake all can affect your bowels.   YOU MUST use at least one of the following options; they are listed in order of increasing strength to get the job done.  They are all available over the counter, and you may need to use some, POSSIBLY even all of these options:    Drink plenty of fluids (prune juice may be  helpful) and high fiber foods Colace 100 mg by mouth twice a day  Senokot for constipation as directed and as needed Dulcolax (bisacodyl), take with full glass of water  Miralax (polyethylene glycol) once or twice a day as needed.  If you have tried all these things and are unable to have a bowel movement in the first 3-4 days after surgery call either your surgeon or your primary doctor.    If you experience loose stools or diarrhea, hold the medications until you stool forms back up.  If your symptoms do not get better within 1 week or if they get worse, check with your doctor.  If you experience "the worst abdominal pain ever" or develop nausea or vomiting, please contact the office immediately for further recommendations for treatment.   ITCHING:  If  experience itching with your medications, try taking only a single pain pill, or even half a pain pill at a time.  You can also use Benadryl over the counter for itching or also to help with sleep.  ° °TED HOSE STOCKINGS:  Use stockings on both legs until for at least 2 weeks or as directed by physician office. They may be removed at night for sleeping. ° °MEDICATIONS:  See your medication summary on the “After Visit Summary” that nursing will review with you.  You may have some home medications which will be placed on hold until you complete the course of blood thinner medication.  It is important for you to complete the blood thinner medication as prescribed. ° °PRECAUTIONS:  If you experience chest pain or shortness of breath - call 911 immediately for transfer to the hospital emergency department.  ° °If you develop a fever greater that 101 F, purulent drainage from wound, increased redness or drainage from wound, foul odor from the wound/dressing, or calf pain - CONTACT YOUR SURGEON.   °                                                °FOLLOW-UP APPOINTMENTS:  If you do not already have a post-op appointment, please call the office for an  appointment to be seen by your surgeon.  Guidelines for how soon to be seen are listed in your “After Visit Summary”, but are typically between 1-4 weeks after surgery. ° °OTHER INSTRUCTIONS:  ° ° ° °MAKE SURE YOU:  °• Understand these instructions.  °• Get help right away if you are not doing well or get worse.  ° ° °Thank you for letting us be a part of your medical care team.  It is a privilege we respect greatly.  We hope these instructions will help you stay on track for a fast and full recovery!  ° ° ° ° °

## 2018-10-18 NOTE — Evaluation (Signed)
Physical Therapy Evaluation Patient Details Name: Steve Ramirez OR MRN: [id number] DOB: Sep 30, 1935 Today's Date: 10/18/2018   History of Present Illness  Pt is an 83 y/o male admitted secondary to L DA  THA.  PMH: R THA, CAD, NSTEMI, HTN, DM, TIA, and melanoma. Pt reports h/o 2 CVAs with RLE deficits and h/o multiple falls.  Clinical Impression  Pt is s/p THA resulting in the deficits listed below (see PT Problem List).  Pt  amb  160' today with min assist and RW. anticipate steady progress.     Pt will benefit from skilled PT to increase their independence and safety with mobility to allow discharge to the venue listed below.      Follow Up Recommendations Follow surgeon's recommendation for DC plan and follow-up therapies    Equipment Recommendations  None recommended by PT    Recommendations for Other Services       Precautions / Restrictions Precautions Precautions: Fall Restrictions Weight Bearing Restrictions: No      Mobility  Bed Mobility Overal bed mobility: Needs Assistance Bed Mobility: Supine to Sit     Supine to sit: Min assist     General bed mobility comments: assist with LLE  Transfers Overall transfer level: Needs assistance Equipment used: Rolling walker (2 wheeled) Transfers: Sit to/from Stand Sit to Stand: Min assist         General transfer comment: cues for hand placement  Ambulation/Gait Ambulation/Gait assistance: Min guard Gait Distance (Feet): 160 Feet Assistive device: Rolling walker (2 wheeled) Gait Pattern/deviations: Step-to pattern;Step-through pattern;Wide base of support;Decreased stride length     General Gait Details: cues for sequence initially  Stairs            Wheelchair Mobility    Modified Rankin (Stroke Patients Only)       Balance                                             Pertinent Vitals/Pain Pain Assessment: 0-10 Pain Score: 2  Pain Location: L hip Pain  Intervention(s): Limited activity within patient's tolerance;Monitored during session;Premedicated before session;Repositioned;Ice applied    Home Living Family/patient expects to be discharged to:: Private residence Living Arrangements: Spouse/significant other Available Help at Discharge: Family;Available PRN/intermittently Type of Home: House Home Access: Stairs to enter   [employer] of Steps: 5 - garage Home Layout: One level Home Equipment: [place] - single point;Walker - 2 wheels;Wheelchair - manual      Prior Function Level of Independence: Independent               Hand Dominance        Extremity/Trunk Assessment   Upper Extremity Assessment Upper Extremity Assessment: Overall WFL for tasks assessed    Lower Extremity Assessment Lower Extremity Assessment: LLE deficits/detail LLE Deficits / Details: grossly 3/5 hip and knee; ankle grossly WFL       Communication   Communication: No difficulties  Cognition Arousal/Alertness: Awake/alert Behavior During Therapy: WFL for tasks assessed/performed Overall Cognitive Status: Within Functional Limits for tasks assessed                                        General Comments      Exercises Total Joint Exercises Ankle Circles/Pumps: AROM;10 reps;Both   Assessment/Plan  PT Assessment Patient needs continued PT services  PT Problem List Decreased strength;Decreased range of motion;Decreased activity tolerance;Decreased mobility;Pain;Decreased knowledge of use of DME       PT Treatment Interventions DME instruction;Gait training;Functional mobility training;Therapeutic activities;Patient/family education;Therapeutic exercise    PT Goals (Current goals can be found in the Care Plan section)  Acute Rehab PT Goals PT Goal Formulation: With patient Time For Goal Achievement: 10/25/18 Potential to Achieve Goals: Good    Frequency 7X/week   Barriers to discharge         Co-evaluation               AM-PAC PT "6 Clicks" Mobility  Outcome Measure Help needed turning from your back to your side while in a flat bed without using bedrails?: A Little Help needed moving from lying on your back to sitting on the side of a flat bed without using bedrails?: A Little Help needed moving to and from a bed to a chair (including a wheelchair)?: A Little Help needed standing up from a chair using your arms (e.g., wheelchair or bedside chair)?: A Little Help needed to walk in hospital room?: A Little Help needed climbing 3-5 steps with a railing? : A Lot 6 Click Score: 17    End of Session Equipment Utilized During Treatment: Gait belt Activity Tolerance: Patient tolerated treatment well Patient left: in chair;with call bell/phone within reach;with chair alarm set   PT Visit Diagnosis: Difficulty in walking, not elsewhere classified (R26.2)    Time: [id number] PT Time Calculation (min) (ACUTE ONLY): 27 min   Charges:   PT Evaluation $PT Eval Low Complexity: 1 Low PT Treatments $Gait Training: 8-22 mins       Kenyon Ana, PT  Pager: [phone] Acute Rehab Dept [facility]): [phone]   10/18/2018   [facility] 10/18/2018, 4:48 PM

## 2018-10-18 NOTE — Interval H&P Note (Signed)
I participated in the care of this patient and agree with the above history, physical and evaluation. I performed a review of the history and a physical exam as detailed   Darcelle Herrada Daniel Alixandria Friedt MD  

## 2018-10-19 ENCOUNTER — Encounter (HOSPITAL_COMMUNITY): Payer: Self-pay | Admitting: Orthopedic Surgery

## 2018-10-19 NOTE — Progress Notes (Signed)
    Subjective: Patient reports pain as mild.  Tolerating diet.  Urinating.  No CP, SOB.  Mobilizing well.  Objective:   VITALS:   Vitals:   10/18/18 1652 10/18/18 2121 10/19/18 0025 10/19/18 0424  BP: (!) 141/65 (!) 101/50 125/67 135/61  Pulse: 90 88 74 77  Resp: 16 18 18 18   Temp: 97.9 F (36.6 C) 98.4 F (36.9 C) 98.6 F (37 C) 98.1 F (36.7 C)  TempSrc:   Oral   SpO2: 97% 100% 93% 98%  Weight:      Height:       CBC Latest Ref Rng & Units 10/10/2018 07/22/2018 04/27/2017  WBC 4.0 - 10.5 K/uL 5.7 4.7 4.8  Hemoglobin 13.0 - 17.0 g/dL 11.8(L) 11.4(L) 13.3  Hematocrit 39.0 - 52.0 % 38.8(L) 37.1(L) 40.9  Platelets 150 - 400 K/uL 402(H) 312 241   BMP Latest Ref Rng & Units 10/10/2018 07/22/2018 05/07/2017  Glucose 70 - 99 mg/dL 133(H) 144(H) 144(H)  BUN 8 - 23 mg/dL 24(H) 20 24  Creatinine 0.61 - 1.24 mg/dL 0.98 0.94 1.03  BUN/Creat Ratio 6 - 22 (calc) - - NOT APPLICABLE  Sodium A999333 - 145 mmol/L 137 140 142  Potassium 3.5 - 5.1 mmol/L 4.5 3.9 4.7  Chloride 98 - 111 mmol/L 106 109 107  CO2 22 - 32 mmol/L 20(L) 23 27  Calcium 8.9 - 10.3 mg/dL 9.7 9.2 9.8   Intake/Output      10/06 0701 - 10/07 0700 10/07 0701 - 10/08 0700   P.O. 720    I.V. (mL/kg) 3286.3 (39.1)    IV Piggyback 350    Total Intake(mL/kg) 4356.3 (51.8)    Urine (mL/kg/hr) 1200 (0.6)    Stool 0    Blood 600    Total Output 1800    Net +2556.3         Stool Occurrence 0 x       Physical Exam: General: NAD.  Upright in bed.  Calm, conversant. Resp: No increased wob Cardio: regular rate and rhythm ABD soft Neurologically intact MSK RLE: Neurovascularly intact Sensation intact distally Feet warm Dorsiflexion/Plantar flexion intact Incision: dressing C/D/I   Assessment: 1 Day Post-Op  S/P Procedure(s) (LRB): TOTAL HIP ARTHROPLASTY ANTERIOR APPROACH (Left) by Dr. Ernesta Amble. Percell Miller on 10/18/2018  Principal Problem:   Primary osteoarthritis of left hip Active Problems:   Type 2 diabetes  mellitus (HCC)   Hyperlipidemia   TIA (transient ischemic attack)   Essential hypertension   CVA (cerebral vascular accident) ([place])   Primary localized osteoarthritis of hip   Primary osteoarthritis, status post total hip arthroplasty Well postop day 1 Tolerating diet voiding Pain control Mobilizing well   Plan: Up with therapy Incentive Spirometry Apply ice   Weight Bearing: Weight Bearing as Tolerated (WBAT) LLE Dressings: Maintain Mepilex.   VTE prophylaxis: Aspirin, SCDs, ambulation.  Resume Plavix Dispo: Home today   Prudencio Burly III, PA-C 10/19/2018, 7:49 AM

## 2018-10-19 NOTE — Progress Notes (Signed)
Physical Therapy Treatment Patient Details Name: Steve Ramirez MRN: [id number] DOB: 1935/03/03 Today's Date: 10/19/2018    History of Present Illness Pt is an 83 y/o male admitted secondary to L DA  THA.  PMH: R THA, CAD, NSTEMI, HTN, DM, TIA, and melanoma. Pt reports h/o 2 CVAs with RLE deficits and h/o multiple falls.    PT Comments    POD # 1 Assisted with amb a greater distance in hallway.  Practiced stairs. General stair comments: 25% VC's on proper sequencing and tech using one crutch.  Addressed all mobility questions, discussed appropriate activity, educated on use of ICE.  Pt ready for D/C to home.   Follow Up Recommendations  Follow surgeon's recommendation for DC plan and follow-up therapies     Equipment Recommendations  None recommended by PT(has from prior)    Recommendations for Other Services       Precautions / Restrictions Precautions Precautions: Fall Restrictions Weight Bearing Restrictions: No    Mobility  Bed Mobility               General bed mobility comments: OOB in recliner  Transfers Overall transfer level: Needs assistance Equipment used: Rolling walker (2 wheeled) Transfers: Sit to/from Stand Sit to Stand: Supervision         General transfer comment: good safety cognition and use of hands to steady self  Ambulation/Gait Ambulation/Gait assistance: Supervision;Min guard Gait Distance (Feet): 75 Feet Assistive device: Rolling walker (2 wheeled) Gait Pattern/deviations: Step-to pattern;Step-through pattern;Wide base of support;Decreased stride length Gait velocity: decreased   General Gait Details: one initial VC safety with turns   Stairs Stairs: Yes Stairs assistance: Min guard;Supervision Stair Management: One rail Left;Step to pattern;Forwards;With crutches Number of Stairs: 3 General stair comments: 25% VC's on proper sequencing and tech using one crutch   Wheelchair Mobility    Modified Rankin (Stroke  Patients Only)       Balance                                            Cognition Arousal/Alertness: Awake/alert Behavior During Therapy: WFL for tasks assessed/performed Overall Cognitive Status: Within Functional Limits for tasks assessed                                        Exercises      General Comments        Pertinent Vitals/Pain Pain Assessment: 0-10 Pain Score: 3  Pain Location: L hip Pain Intervention(s): Monitored during session;Repositioned;Ice applied    Home Living                      Prior Function            PT Goals (current goals can now be found in the care plan section)      Frequency    7X/week      PT Plan      Co-evaluation              AM-PAC PT "6 Clicks" Mobility   Outcome Measure  Help needed turning from your back to your side while in a flat bed without using bedrails?: None Help needed moving from lying on your back to sitting on the side of a flat bed without  using bedrails?: None Help needed moving to and from a bed to a chair (including a wheelchair)?: None Help needed standing up from a chair using your arms (e.g., wheelchair or bedside chair)?: None Help needed to walk in hospital room?: None Help needed climbing 3-5 steps with a railing? : A Little 6 Click Score: 23    End of Session Equipment Utilized During Treatment: Gait belt Activity Tolerance: Patient tolerated treatment well Patient left: in chair;with call bell/phone within reach;with chair alarm set Nurse Communication: (pt ready for D/C to home) PT Visit Diagnosis: Difficulty in walking, not elsewhere classified (R26.2)     Time: [id number] PT Time Calculation (min) (ACUTE ONLY): 29 min  Charges:  $Gait Training: 8-22 mins $Therapeutic Activity: 8-22 mins                     {Macai Sisneros  PTA Acute  Rehabilitation Services Pager      [phone] Office      [phone]

## 2018-10-19 NOTE — Progress Notes (Signed)
Patient discharged to home w/ family. Given all belongings, instructions. No new prescriptions, has equipment. Verbalized understanding of instructions. Escorted to pov via w/c.

## 2018-10-19 NOTE — Discharge Summary (Signed)
Discharge Summary  Patient ID: Steve Ramirez MRN: [id number] DOB/AGE: 1935/06/29 83 y.o.  Admit date: 10/18/2018 Discharge date: 10/19/2018  Admission Diagnoses:  Primary osteoarthritis of left hip  Discharge Diagnoses:  Principal Problem:   Primary osteoarthritis of left hip Active Problems:   Type 2 diabetes mellitus (HCC)   Hyperlipidemia   TIA (transient ischemic attack)   Essential hypertension   CVA (cerebral vascular accident) ([place])   Primary localized osteoarthritis of hip   Past Medical History:  Diagnosis Date  . Coronary atherosclerosis of native coronary artery   . Essential hypertension, benign   . GIB (gastrointestinal bleeding)    Colonic diverticular bleed  . History of melanoma   . History of TIAs   . Stroke ([place])   . Type 2 diabetes mellitus (HCC)     Surgeries: Procedure(s): TOTAL HIP ARTHROPLASTY ANTERIOR APPROACH on 10/18/2018   Consultants (if any):   Discharged Condition: Improved  Hospital Course: PAYDEN VALLELY is an 83 y.o. male who was admitted 10/18/2018 with a diagnosis of Primary osteoarthritis of left hip and went to the operating room on 10/18/2018 and underwent the above named procedures.    He was given perioperative antibiotics:  Anti-infectives (From admission, onward)   Start     Dose/Rate Route Frequency Ordered Stop   10/18/18 1500  ceFAZolin (ANCEF) IVPB 1 g/50 mL premix     1 g 100 mL/hr over 30 Minutes Intravenous Every 6 hours 10/18/18 1309 10/18/18 2045   10/18/18 0615  ceFAZolin (ANCEF) IVPB 2g/100 mL premix     2 g 200 mL/hr over 30 Minutes Intravenous On call to O.R. 10/18/18 [id number] 10/18/18 0845    .  He was given sequential compression devices, early ambulation, and aspirin for DVT prophylaxis.  He benefited maximally from the hospital stay and there were no complications.    Recent vital signs:  Vitals:   10/19/18 0025 10/19/18 0424  BP: 125/67 135/61  Pulse: 74 77  Resp: 18 18  Temp: 98.6 F (37  C) 98.1 F (36.7 C)  SpO2: 93% 98%    Recent laboratory studies:  Lab Results  Component Value Date   HGB 11.8 (L) 10/10/2018   HGB 11.4 (L) 07/22/2018   HGB 13.3 04/27/2017   Lab Results  Component Value Date   WBC 5.7 10/10/2018   PLT 402 (H) 10/10/2018   Lab Results  Component Value Date   INR 1.06 04/26/2017   Lab Results  Component Value Date   NA 137 10/10/2018   K 4.5 10/10/2018   CL 106 10/10/2018   CO2 20 (L) 10/10/2018   BUN 24 (H) 10/10/2018   CREATININE 0.98 10/10/2018   GLUCOSE 133 (H) 10/10/2018    Discharge Medications:   Allergies as of 10/19/2018   No Known Allergies     Medication List    TAKE these medications   aspirin EC 81 MG tablet Take 1 tablet (81 mg total) by mouth 2 (two) times daily. For DVT prophylaxis for 30 days after surgery. What changed: when to take this   baclofen 10 MG tablet Commonly known as: LIORESAL Take 1 tablet (10 mg total) by mouth 3 (three) times daily as needed for muscle spasms.   cetirizine 10 MG tablet Commonly known as: ZYRTEC Take 10 mg by mouth daily.   cloNIDine 0.3 mg/24hr patch Commonly known as: CATAPRES - Dosed in mg/24 hr Place 1 patch (0.3 mg total) onto the skin once a week. What changed: when to  take this   clopidogrel 75 MG tablet Commonly known as: PLAVIX Take 1 tablet (75 mg total) by mouth daily.   docusate sodium 100 MG capsule Commonly known as: Colace Take 1 capsule (100 mg total) by mouth 2 (two) times daily. To prevent constipation while taking pain medication.   escitalopram 10 MG tablet Commonly known as: LEXAPRO Take 1 tablet (10 mg total) by mouth daily.   fenofibrate 145 MG tablet Commonly known as: TRICOR TAKE 1 TABLET BY MOUTH ONCE DAILY What changed: when to take this   Fish Oil 1200 MG Caps Take 1,200 mg by mouth daily.   gabapentin 100 MG capsule Commonly known as: Neurontin Take 1 capsule (100 mg total) by mouth 2 (two) times daily for 14 days. For pain.    isosorbide mononitrate 30 MG 24 hr tablet Commonly known as: IMDUR Take 30 mg by mouth every evening.   levothyroxine 50 MCG tablet Commonly known as: SYNTHROID Take 1 tablet (50 mcg total) by mouth daily. What changed: when to take this   lisinopril 40 MG tablet Commonly known as: ZESTRIL Take 1 tablet (40 mg total) by mouth daily.   magnesium citrate Soln Take 5 mLs by mouth every 3 (three) days.   metFORMIN 500 MG tablet Commonly known as: GLUCOPHAGE Take 1 tablet (500 mg total) by mouth 2 (two) times daily.   metoprolol succinate 25 MG 24 hr tablet Commonly known as: TOPROL-XL Take 25 mg by mouth every evening.   multivitamin tablet Take 1 tablet by mouth daily.   Myrbetriq 50 MG Tb24 tablet Generic drug: mirabegron ER Take 50 mg by mouth daily.   neomycin-bacitracin-polymyxin ointment Commonly known as: NEOSPORIN Apply 1 application topically as needed for wound care.   nitroGLYCERIN 0.4 MG SL tablet Commonly known as: NITROSTAT Place 0.4 mg under the tongue every 5 (five) minutes as needed for chest pain.   ondansetron 4 MG tablet Commonly known as: Zofran Take 1 tablet (4 mg total) by mouth every 8 (eight) hours as needed for nausea or vomiting.   pravastatin 10 MG tablet Commonly known as: PRAVACHOL Take 10 mg by mouth every evening.   rOPINIRole 0.5 MG tablet Commonly known as: REQUIP Take 2 tablets (1 mg total) by mouth at bedtime.   spironolactone 25 MG tablet Commonly known as: ALDACTONE Take 1 tablet (25 mg total) by mouth daily. What changed: when to take this   triamterene-hydrochlorothiazide 37.5-25 MG tablet Commonly known as: MAXZIDE-25 Take 1 tablet by mouth daily.       Diagnostic Studies: Dg C-arm 1-60 Min-no Report  Result Date: 10/18/2018 Fluoroscopy was utilized by the requesting physician.  No radiographic interpretation.   Dg Hip Operative Unilat W Or W/o Pelvis Left  Result Date: 10/18/2018 CLINICAL DATA:  Status post  left hip replacement. EXAM: OPERATIVE left HIP (WITH PELVIS IF PERFORMED) 3 VIEWS TECHNIQUE: Fluoroscopic spot image(s) were submitted for interpretation post-operatively. FLUOROSCOPY TIME:  6 seconds. COMPARISON:  August 02, 2018. FINDINGS: Three intraoperative fluoroscopic images demonstrate the left acetabular and femoral components to be well situated. Expected postoperative changes are seen in the surrounding soft tissues. IMPRESSION: Fluoroscopic guidance provided during left total hip arthroplasty. Electronically Signed   By: Marijo Conception M.D.   On: 10/18/2018 11:36    Disposition: Discharge disposition: 01-Home or Self Care       Discharge Instructions    Discharge patient   Complete by: As directed    After a.m. therapy   Discharge disposition:  01-Home or Self Care   Discharge patient date: 10/19/2018      Follow-up Information    Renette Butters, MD.   Specialty: Orthopedic Surgery Contact information: [address] [place] [address] [phone]            Signed: Prudencio Burly III PA-C 10/19/2018, 7:51 AM

## 2018-10-24 DIAGNOSIS — R69 Illness, unspecified: Secondary | ICD-10-CM | POA: Diagnosis not present

## 2018-11-02 DIAGNOSIS — M1612 Unilateral primary osteoarthritis, left hip: Secondary | ICD-10-CM | POA: Diagnosis not present

## 2018-11-09 DIAGNOSIS — M1612 Unilateral primary osteoarthritis, left hip: Secondary | ICD-10-CM | POA: Diagnosis not present

## 2018-11-16 DIAGNOSIS — M1612 Unilateral primary osteoarthritis, left hip: Secondary | ICD-10-CM | POA: Diagnosis not present

## 2018-11-30 DIAGNOSIS — M1612 Unilateral primary osteoarthritis, left hip: Secondary | ICD-10-CM | POA: Diagnosis not present

## 2018-12-21 DIAGNOSIS — M1612 Unilateral primary osteoarthritis, left hip: Secondary | ICD-10-CM | POA: Diagnosis not present

## 2018-12-22 DIAGNOSIS — I2510 Atherosclerotic heart disease of native coronary artery without angina pectoris: Secondary | ICD-10-CM | POA: Diagnosis not present

## 2018-12-22 DIAGNOSIS — I10 Essential (primary) hypertension: Secondary | ICD-10-CM | POA: Diagnosis not present

## 2019-08-04 ENCOUNTER — Other Ambulatory Visit (HOSPITAL_COMMUNITY): Payer: Self-pay | Admitting: Family Medicine

## 2019-08-04 ENCOUNTER — Ambulatory Visit (HOSPITAL_COMMUNITY)
Admission: RE | Admit: 2019-08-04 | Discharge: 2019-08-04 | Disposition: A | Discharge status: Home / Self Care | Payer: Medicare HMO | Source: Ambulatory Visit | Attending: Family Medicine | Admitting: Family Medicine

## 2019-08-04 ENCOUNTER — Other Ambulatory Visit: Payer: Self-pay

## 2019-08-04 DIAGNOSIS — S301XXA Contusion of abdominal wall, initial encounter: Secondary | ICD-10-CM

## 2019-08-04 DIAGNOSIS — R1904 Left lower quadrant abdominal swelling, mass and lump: Secondary | ICD-10-CM

## 2019-11-07 ENCOUNTER — Ambulatory Visit: Payer: Medicare HMO | Admitting: Cardiology

## 2020-04-15 ENCOUNTER — Other Ambulatory Visit (HOSPITAL_COMMUNITY): Payer: Self-pay | Admitting: Internal Medicine

## 2020-04-15 ENCOUNTER — Ambulatory Visit (HOSPITAL_COMMUNITY)
Admission: RE | Admit: 2020-04-15 | Discharge: 2020-04-15 | Disposition: A | Discharge status: Home / Self Care | Payer: Medicare HMO | Source: Ambulatory Visit | Attending: Internal Medicine | Admitting: Internal Medicine

## 2020-04-15 DIAGNOSIS — R059 Cough, unspecified: Secondary | ICD-10-CM | POA: Diagnosis not present

## 2020-07-29 ENCOUNTER — Other Ambulatory Visit (HOSPITAL_COMMUNITY): Payer: Self-pay | Admitting: Internal Medicine

## 2020-07-29 DIAGNOSIS — R059 Cough, unspecified: Secondary | ICD-10-CM

## 2020-08-15 ENCOUNTER — Emergency Department (HOSPITAL_COMMUNITY)
Admission: EM | Admit: 2020-08-15 | Discharge: 2020-08-15 | Disposition: A | Discharge status: Home / Self Care | Payer: Medicare HMO | Attending: Emergency Medicine | Admitting: Emergency Medicine

## 2020-08-15 ENCOUNTER — Emergency Department (HOSPITAL_COMMUNITY): Payer: Medicare HMO

## 2020-08-15 ENCOUNTER — Encounter (HOSPITAL_COMMUNITY): Payer: Self-pay | Admitting: Emergency Medicine

## 2020-08-15 ENCOUNTER — Other Ambulatory Visit: Payer: Self-pay

## 2020-08-15 DIAGNOSIS — Z79899 Other long term (current) drug therapy: Secondary | ICD-10-CM | POA: Diagnosis not present

## 2020-08-15 DIAGNOSIS — Z7984 Long term (current) use of oral hypoglycemic drugs: Secondary | ICD-10-CM | POA: Insufficient documentation

## 2020-08-15 DIAGNOSIS — I2510 Atherosclerotic heart disease of native coronary artery without angina pectoris: Secondary | ICD-10-CM | POA: Diagnosis not present

## 2020-08-15 DIAGNOSIS — E119 Type 2 diabetes mellitus without complications: Secondary | ICD-10-CM | POA: Diagnosis not present

## 2020-08-15 DIAGNOSIS — Z7982 Long term (current) use of aspirin: Secondary | ICD-10-CM | POA: Insufficient documentation

## 2020-08-15 DIAGNOSIS — Z7902 Long term (current) use of antithrombotics/antiplatelets: Secondary | ICD-10-CM | POA: Diagnosis not present

## 2020-08-15 DIAGNOSIS — R479 Unspecified speech disturbances: Secondary | ICD-10-CM | POA: Diagnosis present

## 2020-08-15 DIAGNOSIS — Z96643 Presence of artificial hip joint, bilateral: Secondary | ICD-10-CM | POA: Insufficient documentation

## 2020-08-15 DIAGNOSIS — Z87891 Personal history of nicotine dependence: Secondary | ICD-10-CM | POA: Insufficient documentation

## 2020-08-15 DIAGNOSIS — I10 Essential (primary) hypertension: Secondary | ICD-10-CM | POA: Insufficient documentation

## 2020-08-15 DIAGNOSIS — G459 Transient cerebral ischemic attack, unspecified: Secondary | ICD-10-CM | POA: Insufficient documentation

## 2020-08-15 DIAGNOSIS — Z20822 Contact with and (suspected) exposure to covid-19: Secondary | ICD-10-CM | POA: Diagnosis not present

## 2020-08-15 LAB — RAPID URINE DRUG SCREEN, HOSP PERFORMED
Amphetamines: NOT DETECTED
Barbiturates: NOT DETECTED
Benzodiazepines: NOT DETECTED
Cocaine: NOT DETECTED
Opiates: NOT DETECTED
Tetrahydrocannabinol: NOT DETECTED

## 2020-08-15 LAB — I-STAT CHEM 8, ED
BUN: 19 mg/dL (ref 8–23)
Calcium, Ion: 1.17 mmol/L (ref 1.15–1.40)
Chloride: 107 mmol/L (ref 98–111)
Creatinine, Ser: 0.8 mg/dL (ref 0.61–1.24)
Glucose, Bld: 98 mg/dL (ref 70–99)
HCT: 33 % — ABNORMAL LOW (ref 39.0–52.0)
Hemoglobin: 11.2 g/dL — ABNORMAL LOW (ref 13.0–17.0)
Potassium: 4.1 mmol/L (ref 3.5–5.1)
Sodium: 139 mmol/L (ref 135–145)
TCO2: 23 mmol/L (ref 22–32)

## 2020-08-15 LAB — COMPREHENSIVE METABOLIC PANEL
ALT: 14 U/L (ref 0–44)
AST: 16 U/L (ref 15–41)
Albumin: 4.1 g/dL (ref 3.5–5.0)
Alkaline Phosphatase: 57 U/L (ref 38–126)
Anion gap: 11 (ref 5–15)
BUN: 17 mg/dL (ref 8–23)
CO2: 21 mmol/L — ABNORMAL LOW (ref 22–32)
Calcium: 9.3 mg/dL (ref 8.9–10.3)
Chloride: 106 mmol/L (ref 98–111)
Creatinine, Ser: 0.81 mg/dL (ref 0.61–1.24)
GFR, Estimated: >60 mL/min (ref >60)
Glucose, Bld: 98 mg/dL (ref 70–99)
Potassium: 4.2 mmol/L (ref 3.5–5.1)
Sodium: 138 mmol/L (ref 135–145)
Total Bilirubin: 0.5 mg/dL (ref 0.3–1.2)
Total Protein: 6.9 g/dL (ref 6.5–8.1)

## 2020-08-15 LAB — URINALYSIS, ROUTINE W REFLEX MICROSCOPIC
Bilirubin Urine: NEGATIVE
Glucose, UA: NEGATIVE mg/dL
Hgb urine dipstick: NEGATIVE
Ketones, ur: NEGATIVE mg/dL
Leukocytes,Ua: NEGATIVE
Nitrite: NEGATIVE
Protein, ur: NEGATIVE mg/dL
Specific Gravity, Urine: 1.009 (ref 1.005–1.030)
pH: 5 (ref 5.0–8.0)

## 2020-08-15 LAB — LIPID PANEL
Cholesterol: 116 mg/dL (ref 0–200)
HDL: 27 mg/dL — ABNORMAL LOW (ref >40)
LDL Cholesterol: 24 mg/dL (ref 0–99)
Total CHOL/HDL Ratio: 4.3 RATIO
Triglycerides: 327 mg/dL — ABNORMAL HIGH (ref <150)
VLDL: 65 mg/dL — ABNORMAL HIGH (ref 0–40)

## 2020-08-15 LAB — HEMOGLOBIN A1C
Hgb A1c MFr Bld: 7 % — ABNORMAL HIGH (ref 4.8–5.6)
Mean Plasma Glucose: 154.2 mg/dL

## 2020-08-15 LAB — DIFFERENTIAL
Abs Immature Granulocytes: 0.02 10*3/uL (ref 0.00–0.07)
Basophils Absolute: 0.1 10*3/uL (ref 0.0–0.1)
Basophils Relative: 1 %
Eosinophils Absolute: 0.4 10*3/uL (ref 0.0–0.5)
Eosinophils Relative: 7 %
Immature Granulocytes: 0 %
Lymphocytes Relative: 24 %
Lymphs Abs: 1.5 10*3/uL (ref 0.7–4.0)
Monocytes Absolute: 0.5 10*3/uL (ref 0.1–1.0)
Monocytes Relative: 8 %
Neutro Abs: 3.7 10*3/uL (ref 1.7–7.7)
Neutrophils Relative %: 60 %

## 2020-08-15 LAB — CBC
HCT: 34.7 % — ABNORMAL LOW (ref 39.0–52.0)
Hemoglobin: 10.6 g/dL — ABNORMAL LOW (ref 13.0–17.0)
MCH: 22.7 pg — ABNORMAL LOW (ref 26.0–34.0)
MCHC: 30.5 g/dL (ref 30.0–36.0)
MCV: 74.3 fL — ABNORMAL LOW (ref 80.0–100.0)
Platelets: 428 10*3/uL — ABNORMAL HIGH (ref 150–400)
RBC: 4.67 MIL/uL (ref 4.22–5.81)
RDW: 15.9 % — ABNORMAL HIGH (ref 11.5–15.5)
WBC: 6.2 10*3/uL (ref 4.0–10.5)
nRBC: 0 % (ref 0.0–0.2)

## 2020-08-15 LAB — RESP PANEL BY RT-PCR (FLU A&B, COVID) ARPGX2
Influenza A by PCR: NEGATIVE
Influenza B by PCR: NEGATIVE
SARS Coronavirus 2 by RT PCR: NEGATIVE

## 2020-08-15 LAB — APTT: aPTT: 27 seconds (ref 24–36)

## 2020-08-15 LAB — PROTIME-INR
INR: 1.1 (ref 0.8–1.2)
Prothrombin Time: 13.8 seconds (ref 11.4–15.2)

## 2020-08-15 LAB — ETHANOL: Alcohol, Ethyl (B): <10 mg/dL (ref <10)

## 2020-08-15 MED ORDER — LABETALOL HCL 5 MG/ML IV SOLN
5.0000 mg | Freq: Once | INTRAVENOUS | Status: AC
  Administered 2020-08-15: 5 mg via INTRAVENOUS
  Filled 2020-08-15: qty 4

## 2020-08-15 NOTE — ED Notes (Signed)
Neuro NP at bedside for assessment.

## 2020-08-15 NOTE — ED Notes (Signed)
Patient transported to MRI 

## 2020-08-15 NOTE — Consult Note (Signed)
Neurology Consultation  Reason for Consult: Transient aphasia Referring Physician: Dr. Makayla Fischer  CC: Transient aphasia  History is obtained from: Chart review, Patient  HPI: Steve Ramirez is a 85 y.o. male with a medical history significant for coronary artery disease, essential hypertension, remote GIB, melanoma, recurrent strokes, multiple TIAs, and type 2 diabetes mellitus who presented to the ED today from his senior living facility for evaluation of a transient episode of aphasia. Patient states that he was at lunch but suddenly "did not feel like talking" and decided to take his wife back to their room. Per facility staff, other residents had noticed that he was dropping things such as his spoon and the salt shaker and was not making sense with his words. Staff followed him to his room and stated that he was not acting like himself as if he did not remember how to walk with his walker prompting EMS activation. He states that he was attempting to speak and knew what he wanted to say but his words came out as gibberish. This lasted for approximately 30 minutes before resolution. Patient states that this has happened to him approximately 5 times in the past but he has not always been worked up for these episodes. Steve Ramirez is adamant that he does not want to stay throughout the night for thorough work up because he has to get back to the facility to care for his wife and he does not trust facility staff to care for her correctly.   Of note, Steve Ramirez has been seen multiple times in the past for strokes- September 2017 for left leg weakness with a right pontine infarction, November 2018 with dysarthria and dysphagia and on discharge had to return to the hospital after being found slumped over and weak with findings of an acute left posterior limb of internal capsule lacunar infarction and work up at that time revealed significant intracranial atherosclerosis. 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 He last followed up with outpatient [facility] in December of 2018 with Dr. Leta Baptist. Also, of note, patient was seen in December of 2016 with speech difficulties and had an EEG complete at that time without seizures or epileptiform discharges.   At baseline, Steve Ramirez lives in an senior living center with his wife. He ambulates with a walker and helps to take care of his wife that has significant illnesses. His facility manages his medication and provides them with meals but he states that he is still able to drive independently.  LKW: Currently at baseline, symptoms resolved in approximately 30 minutes tpa given?: no, resolution of symptoms  IR Thrombectomy? No, presentation not consistent with LVO Modified Rankin Scale: 3-Moderate disability-requires help but walks WITHOUT assistance  ROS: A complete ROS was performed and is negative except as noted in the HPI.   Past Medical History:  Diagnosis Date   Coronary atherosclerosis of native coronary artery    Essential hypertension, benign    GIB (gastrointestinal bleeding)    Colonic diverticular bleed   History of melanoma    History of TIAs    Stroke [facility])    Type 2 diabetes mellitus ([place])    Past Surgical History:  Procedure Laterality Date   COLONOSCOPY  08/13/2010   Procedure: COLONOSCOPY;  Surgeon: Rogene Houston, MD;  Location: [facility];  Service: Endoscopy;  Laterality: N/A;   JOINT REPLACEMENT     KNEE SURGERY     arthroscopic  LEFT HEART CATHETERIZATION WITH CORONARY ANGIOGRAM N/A 11/24/2010   Procedure: LEFT HEART CATHETERIZATION WITH CORONARY ANGIOGRAM;  Surgeon: Josue Hector, MD;  Location: [facility] CATH LAB;  Service: Cardiovascular;  Laterality: N/A;   neck fusion     years ago   TOTAL HIP ARTHROPLASTY Right 08/02/2018   Procedure: TOTAL HIP ARTHROPLASTY ANTERIOR APPROACH;  Surgeon: Renette Butters, MD;  Location: [facility] ORS;   Service: Orthopedics;  Laterality: Right;   TOTAL HIP ARTHROPLASTY Left 10/18/2018   Procedure: TOTAL HIP ARTHROPLASTY ANTERIOR APPROACH;  Surgeon: Renette Butters, MD;  Location: [facility] ORS;  Service: Orthopedics;  Laterality: Left;   Family History  Problem Relation Age of Onset   Coronary artery disease Father    Asthma Other    Emphysema Mother    Coronary artery disease Paternal Uncle    Brain cancer Brother    Social History:   reports that he quit smoking about 40 years ago. His smoking use included cigarettes. He has never used smokeless tobacco. He reports current alcohol use. He reports that he does not use drugs.  Medications No current facility-administered medications for this encounter.  Current Outpatient Medications:    aspirin EC 81 MG tablet, Take 1 tablet (81 mg total) by mouth 2 (two) times daily. For DVT prophylaxis for 30 days after surgery., Disp: 60 tablet, Rfl: 0   baclofen (LIORESAL) 10 MG tablet, Take 1 tablet (10 mg total) by mouth 3 (three) times daily as needed for muscle spasms. (Patient not taking: Reported on 10/05/2018), Disp: 20 each, Rfl: 0   cetirizine (ZYRTEC) 10 MG tablet, Take 10 mg by mouth daily., Disp: , Rfl:    cloNIDine (CATAPRES - DOSED IN MG/24 HR) 0.3 mg/24hr patch, Place 1 patch (0.3 mg total) onto the skin once a week. (Patient taking differently: Place 0.3 mg onto the skin every Saturday. ), Disp: 4 patch, Rfl: 12   clopidogrel (PLAVIX) 75 MG tablet, Take 1 tablet (75 mg total) by mouth daily., Disp: 90 tablet, Rfl: 0   docusate sodium (COLACE) 100 MG capsule, Take 1 capsule (100 mg total) by mouth 2 (two) times daily. To prevent constipation while taking pain medication. (Patient not taking: Reported on 10/05/2018), Disp: 30 capsule, Rfl: 0   escitalopram (LEXAPRO) 10 MG tablet, Take 1 tablet (10 mg total) by mouth daily., Disp: 90 tablet, Rfl: 0   fenofibrate (TRICOR) 145 MG tablet, TAKE 1 TABLET BY MOUTH ONCE DAILY (Patient taking  differently: Take 145 mg by mouth every evening. ), Disp: 90 tablet, Rfl: 0   gabapentin (NEURONTIN) 100 MG capsule, Take 1 capsule (100 mg total) by mouth 2 (two) times daily for 14 days. For pain. (Patient not taking: Reported on 10/05/2018), Disp: 28 capsule, Rfl: 0   isosorbide mononitrate (IMDUR) 30 MG 24 hr tablet, Take 30 mg by mouth every evening. , Disp: , Rfl:    levothyroxine (SYNTHROID, LEVOTHROID) 50 MCG tablet, Take 1 tablet (50 mcg total) by mouth daily. (Patient taking differently: Take 50 mcg by mouth every evening. ), Disp: 90 tablet, Rfl: 0   lisinopril (PRINIVIL,ZESTRIL) 40 MG tablet, Take 1 tablet (40 mg total) by mouth daily., Disp: 90 tablet, Rfl: 3   magnesium citrate SOLN, Take 5 mLs by mouth every 3 (three) days., Disp: , Rfl:    metFORMIN (GLUCOPHAGE) 500 MG tablet, Take 1 tablet (500 mg total) by mouth 2 (two) times daily., Disp: 180 tablet, Rfl: 0   metoprolol succinate (TOPROL-XL) 25 MG 24 hr tablet,  Take 25 mg by mouth every evening. , Disp: , Rfl:    mirabegron ER (MYRBETRIQ) 50 MG TB24 tablet, Take 50 mg by mouth daily., Disp: , Rfl:    Multiple Vitamin (MULTIVITAMIN) tablet, Take 1 tablet by mouth daily.  , Disp: , Rfl:    neomycin-bacitracin-polymyxin (NEOSPORIN) ointment, Apply 1 application topically as needed for wound care., Disp: , Rfl:    nitroGLYCERIN (NITROSTAT) 0.4 MG SL tablet, Place 0.4 mg under the tongue every 5 (five) minutes as needed for chest pain., Disp: , Rfl:    Omega-3 Fatty Acids (FISH OIL) 1200 MG CAPS, Take 1,200 mg by mouth daily., Disp: , Rfl:    ondansetron (ZOFRAN) 4 MG tablet, Take 1 tablet (4 mg total) by mouth every 8 (eight) hours as needed for nausea or vomiting. (Patient not taking: Reported on 10/05/2018), Disp: 20 tablet, Rfl: 0   pravastatin (PRAVACHOL) 10 MG tablet, Take 10 mg by mouth every evening. , Disp: , Rfl:    rOPINIRole (REQUIP) 0.5 MG tablet, Take 2 tablets (1 mg total) by mouth at bedtime., Disp: 60 tablet, Rfl: 0    spironolactone (ALDACTONE) 25 MG tablet, Take 1 tablet (25 mg total) by mouth daily. (Patient taking differently: Take 25 mg by mouth every evening. ), Disp: 90 tablet, Rfl: 0   triamterene-hydrochlorothiazide (MAXZIDE-25) 37.5-25 MG tablet, Take 1 tablet by mouth daily., Disp: 90 tablet, Rfl: 3  Exam: Current vital signs: BP (!) 201/84   Pulse 82   Temp (!) 97.4 F (36.3 C) (Temporal)   Resp 16   Ht '5\' 8"'$  (1.727 m)   Wt 84.1 kg   SpO2 96%   BMI 28.19 kg/m  Vital signs in last 24 hours: Temp:  [97.4 F (36.3 C)] 97.4 F (36.3 C) (08/04 1415) Pulse Rate:  [70-85] 82 (08/04 1515) Resp:  [16-19] 16 (08/04 1515) BP: (186-204)/(80-90) 201/84 (08/04 1515) SpO2:  [96 %-98 %] 96 % (08/04 1515) Weight:  [84.1 kg] 84.1 kg (08/04 1416)  GENERAL: Awake, alert, in no acute distress Psych: Affect appropriate for situation, calm and cooperative with examination Head: Normocephalic and atraumatic without obvious abnormality EENT: Normal conjunctivae, no OP obstruction, dry mucous membranes LUNGS: Normal respiratory effort. Non-labored breathing on room air CV: Irregular rate on cardiac monitor with frequent PVCs ABDOMEN: Soft, non-tender Ext: warm, well perfused, without obvious deformity  NEURO:  Mental Status: Awake, alert, and oriented.  He is able to provide clear and coherent details of presenting illness but minimizes his symptoms.  Speech is intact without dysarthria or aphasia. Naming, repetition, fluency, and comprehension intact. No neglect is noted.  Cranial Nerves:  II: PERRL 3 mm/brisk. Visual fields full.  III, IV, VI: EOMI without ptosis, nystagmus, or gaze preference.  V: Sensation is intact to light touch and symmetrical to face.  VII: Face is symmetric resting and smiling. VIII: Hearing is intact to voice IX, X: Palate elevation is symmetric. Phonation normal.  XI: Normal sternocleidomastoid and trapezius muscle strength XII: Tongue protrudes midline without  fasciculations.   Motor: 5/5 strength is all muscle groups without vertical drift on assessment.  Tone is normal. Bulk is normal.  Sensation: Intact to light touch bilaterally in all four extremities. No extinction to DSS present.  Coordination: FTN intact bilaterally. HKS intact bilaterally.   DTRs: Left ankle with 3-4 beats of clonus with ankle dorsiflexion, right ankle with 2 beats of clonus with ankle dorsiflexion, 3+ biceps and brachioradialis Gait: Deferred  NIHSS: 1a Level of Conscious.: 0  1b LOC Questions: 0 1c LOC Commands: 0 2 Best Gaze: 0 3 Visual: 0 4 Facial Palsy: 0 5a Motor Arm - left: 0 5b Motor Arm - Right: 0 6a Motor Leg - Left: 0 6b Motor Leg - Right: 0 7 Limb Ataxia: 0 8 Sensory: 0 9 Best Language: 0 10 Dysarthria: 0 11 Extinct. and Inatten.: 0 TOTAL: 0  Labs I have reviewed labs in epic and the results pertinent to this consultation are: CBC    Component Value Date/Time   WBC 6.2 08/15/2020 1418   RBC 4.67 08/15/2020 1418   HGB 11.2 (L) 08/15/2020 1436   HCT 33.0 (L) 08/15/2020 1436   PLT 428 (H) 08/15/2020 1418   MCV 74.3 (L) 08/15/2020 1418   MCH 22.7 (L) 08/15/2020 1418   MCHC 30.5 08/15/2020 1418   RDW 15.9 (H) 08/15/2020 1418   LYMPHSABS 1.5 08/15/2020 1418   MONOABS 0.5 08/15/2020 1418   EOSABS 0.4 08/15/2020 1418   BASOSABS 0.1 08/15/2020 1418  CMP     Component Value Date/Time   NA 139 08/15/2020 1436   K 4.1 08/15/2020 1436   CL 107 08/15/2020 1436   CO2 20 (L) 10/10/2018 1011   GLUCOSE 98 08/15/2020 1436   BUN 19 08/15/2020 1436   CREATININE 0.80 08/15/2020 1436   CREATININE 1.03 05/07/2017 0917   CALCIUM 9.7 10/10/2018 1011   PROT 7.0 05/07/2017 0917   ALBUMIN 4.5 04/26/2017 1452   AST 14 05/07/2017 0917   ALT 19 05/07/2017 0917   ALKPHOS 41 04/26/2017 1452   BILITOT 0.6 05/07/2017 0917   GFRNONAA >60 10/10/2018 1011   GFRNONAA 69 05/07/2017 0917   GFRAA >60 10/10/2018 1011   GFRAA 80 05/07/2017 0917  Lipid Panel      Component Value Date/Time   CHOL 136 05/07/2017 0917   TRIG 150 (H) 05/07/2017 0917   HDL 29 (L) 05/07/2017 0917   CHOLHDL 4.7 05/07/2017 0917   VLDL 48 (H) 04/27/2017 0540   LDLCALC 82 05/07/2017 0917    Imaging I have reviewed the images obtained:  MRI examination of the brain pending  Assessment: 85 y.o. male who presented to the ED via EMS from his assisted living facility for evaluation of transient aphasia witnessed at his assisted living facility today that lasted approximately 20 minutes.  - Examination reveals patient with resolution of presenting symptoms with an NIHSS of 0. - Stroke risk factors include HTN, HLD, previous strokes, advanced age, CAD, known intracranial atherosclerosis, and type 2 diabetes mellitus.  - Presentation is most consistent with a transient ischemic attack versus a small ischemic stroke due to resolution of symptoms. Will complete stroke work up for further evaluation.   Impression: Transient expressive aphasia Concern for transient ischemic attack versus small ischemic stroke   Recommendations: - MRI brain without contrast - MRA head and neck without contrast - Stroke labs: Hemoglobin A1c, Lipid panel  - Goal A1c <7%, goal LDL < 70 - Echocardiogram - Frequent neuro checks - Allow for permissive hypertension for 24 hours or if MRI with acute infarction  - Prophylactic therapy- Continue home ASA and clopidogrel - Risk factor modification - Telemetry monitoring - Patient adamant about being discharged today. Discussed in depth strict return precautions with patient with recommendation of close follow-up with outpatient neurologist if he decides to leave prior to full stroke work up completion.  Pt seen by NP/Neuro and later by MD. Note/plan to be edited by MD as needed.  Anibal Henderson, AGAC-NP [facility]  Pager: [phone]   NEUROHOSPITALIST ADDENDUM Performed a face to face diagnostic evaluation.   I have reviewed the  contents of history and physical exam as documented by PA/ARNP/Resident and agree with above documentation.  I have discussed and formulated the above plan as documented. Edits to the note have been made as needed.  Impression/Key exam findings/Plan: 30 mins episode of what seems like aphasia per his description. EMS reported starring off at his facility but facility staff not aware of any starring off when discussed over phone. He declines admission overnight for stroke/TIA workup. Will try to get as much workup as we can but will need the rest of the workup to be completed outpatient. I do think that he grasps the gravity of the situation but is weighing this against taking care of his wife. He understands that he is at risk for strokes in the immediate period after a stroke/TIA and is at higher risk for stroke than general population. Specially since aphasia is a sign of a cortical stroke. I gave him strict return precautions. He should get to the Emergency Department or call EMS as soon as possible, at the first sign of a stroke. He understands these instructions.  Follow up with Dr. Andrey Spearman, his outpatient neurologist. He loved seeing Dr. Leta Baptist in the past and is happy to follow up with him.  Donnetta Simpers, MD [facility] [phone]   If 7pm to 7am, please call on call as listed on AMION.

## 2020-08-15 NOTE — ED Provider Notes (Signed)
Patient cleared for discharge home and follow-up with [facility] neurology.  Referral information provided.  Patient will return for any new or worse symptoms.  Significant findings on the MRI MRA.   Fredia Sorrow, MD 08/15/20 [phone]

## 2020-08-15 NOTE — ED Triage Notes (Signed)
Patient BIB [employer] [facility] [facility] from [facility]. Staff at facility reporting that while patient was eating lunch at 1300 hrs today the patient suddenly became "unresponsive"- eyes open and awake but not responding to voice at all. The patient was staring straight ahead. Pt came to, but was altered in mentation, not able to recall time and place or recall his own children's names. Patient typically Aox4. VS as follow:   BP-195/84  Spo2-98% HR 65   Pt does have hx of 2 prior strokes and 5 TIAs in the past.

## 2020-08-15 NOTE — ED Provider Notes (Signed)
[facility] EMERGENCY DEPARTMENT Provider Note   CSN: [id number] Arrival date & time: 08/15/20  1405     History Chief Complaint  Patient presents with   Altered Mental Status    Steve Ramirez is a 85 y.o. male.  HPI     85 year old male with history of coronary artery disease, CVA, history of TIAs, diabetes, melanoma, diverticular bleed, hypertension, right rotator cuff pathology, presents with concern for episode of difficulty speaking.  Per [facility], patient was at [place] assisted living facility and was eating lunch at 1:00 when he suddenly became "unresponsive"--EMS denies it being a syncopal episode, but reports that he was staring forward without responding for approximately 20 minutes.  [name] reports he remembers everything that happened, and that for approximately 30 minutes he was having difficulty finding his words.  He reports that if he tried to speak it would not come out right.  Reports that his speech seems back to normal now.  Per EMS, he was initially altered in mentation, not able to recall time or place or his children's names.  He reports he did take his normal medication today.  Denies any chest pain, shortness of breath, nausea, vomiting, headache, diarrhea, fevers, cough, visual changes, numbness or weakness on one side or the other or dizziness.  Past Medical History:  Diagnosis Date   Coronary atherosclerosis of native coronary artery    Essential hypertension, benign    GIB (gastrointestinal bleeding)    Colonic diverticular bleed   History of melanoma    History of TIAs    Stroke [facility])    Type 2 diabetes mellitus ([place])     Patient Active Problem List   Diagnosis Date Noted   Primary osteoarthritis of left hip 09/14/2018   Primary localized osteoarthritis of hip 08/02/2018   Primary osteoarthritis of right hip 07/07/2018   Near syncope 12/03/2016   CVA (cerebral vascular accident) ([place]) 12/03/2016    Right hemiparesis ([place]) 12/02/2016   Acute ischemic stroke ([place]) 12/02/2016   Essential hypertension 12/02/2016   Dysphagia 12/02/2016   Acute CVA (cerebrovascular accident) ([place]) 09/13/2015   TIA (transient ischemic attack) 11/29/2014   Shoulder stiffness 09/05/2012   Right rotator cuff tear 08/16/2012   Shoulder subluxation, right 08/09/2012   Elevated transaminase level 11/05/2011   Coronary atherosclerosis of native coronary artery 11/26/2010   Essential hypertension, benign 11/23/2010   Type 2 diabetes mellitus ([place]) 11/23/2010   Hyperlipidemia 11/23/2010   History of TIAs 11/23/2010    Past Surgical History:  Procedure Laterality Date   COLONOSCOPY  08/13/2010   Procedure: COLONOSCOPY;  Surgeon: Rogene Houston, MD;  Location: [facility] ENDO SUITE;  Service: Endoscopy;  Laterality: N/A;   JOINT REPLACEMENT     KNEE SURGERY     arthroscopic   LEFT HEART CATHETERIZATION WITH CORONARY ANGIOGRAM N/A 11/24/2010   Procedure: LEFT HEART CATHETERIZATION WITH CORONARY ANGIOGRAM;  Surgeon: Josue Hector, MD;  Location: [facility] CATH LAB;  Service: Cardiovascular;  Laterality: N/A;   neck fusion     years ago   TOTAL HIP ARTHROPLASTY Right 08/02/2018   Procedure: TOTAL HIP ARTHROPLASTY ANTERIOR APPROACH;  Surgeon: Renette Butters, MD;  Location: [facility] ORS;  Service: Orthopedics;  Laterality: Right;   TOTAL HIP ARTHROPLASTY Left 10/18/2018   Procedure: TOTAL HIP ARTHROPLASTY ANTERIOR APPROACH;  Surgeon: Renette Butters, MD;  Location: [facility] ORS;  Service: Orthopedics;  Laterality: Left;       Family History  Problem Relation Age  of Onset   Coronary artery disease Father    Asthma Other    Emphysema Mother    Coronary artery disease Paternal Uncle    Brain cancer Brother     Social History   Tobacco Use   Smoking status: Former    Types: Cigarettes    Quit date: 01/13/1980    Years since quitting: 40.6   Smokeless tobacco: Never  Vaping Use   Vaping Use: Never used  Substance Use  Topics   Alcohol use: Yes    Comment: occasionally   Drug use: No    Home Medications Prior to Admission medications   Medication Sig Start Date End Date Taking? Authorizing Provider  aspirin EC 81 MG tablet Take 1 tablet (81 mg total) by mouth 2 (two) times daily. For DVT prophylaxis for 30 days after surgery. 10/18/18   Martensen, Charna Elizabeth III, PA-C  baclofen (LIORESAL) 10 MG tablet Take 1 tablet (10 mg total) by mouth 3 (three) times daily as needed for muscle spasms. Patient not taking: Reported on 10/05/2018 08/02/18   Prudencio Burly III, PA-C  cetirizine (ZYRTEC) 10 MG tablet Take 10 mg by mouth daily.    [provider]  cloNIDine (CATAPRES - DOSED IN MG/24 HR) 0.3 mg/24hr patch Place 1 patch (0.3 mg total) onto the skin once a week. Patient taking differently: Place 0.3 mg onto the skin every Saturday.  05/26/17   Caren Macadam, MD  clopidogrel (PLAVIX) 75 MG tablet Take 1 tablet (75 mg total) by mouth daily. 06/10/17   Caren Macadam, MD  docusate sodium (COLACE) 100 MG capsule Take 1 capsule (100 mg total) by mouth 2 (two) times daily. To prevent constipation while taking pain medication. Patient not taking: Reported on 10/05/2018 08/02/18   Prudencio Burly III, PA-C  escitalopram (LEXAPRO) 10 MG tablet Take 1 tablet (10 mg total) by mouth daily. 06/10/17   Caren Macadam, MD  fenofibrate (TRICOR) 145 MG tablet TAKE 1 TABLET BY MOUTH ONCE DAILY Patient taking differently: Take 145 mg by mouth every evening.  09/29/17   Caren Macadam, MD  gabapentin (NEURONTIN) 100 MG capsule Take 1 capsule (100 mg total) by mouth 2 (two) times daily for 14 days. For pain. Patient not taking: Reported on 10/05/2018 08/02/18 10/05/18  Prudencio Burly III, PA-C  isosorbide mononitrate (IMDUR) 30 MG 24 hr tablet Take 30 mg by mouth every evening.  11/30/16   [provider]  levothyroxine (SYNTHROID, LEVOTHROID) 50 MCG tablet Take 1 tablet (50 mcg total) by mouth  daily. Patient taking differently: Take 50 mcg by mouth every evening.  06/10/17   Caren Macadam, MD  lisinopril (PRINIVIL,ZESTRIL) 40 MG tablet Take 1 tablet (40 mg total) by mouth daily. 04/01/17   Caren Macadam, MD  magnesium citrate SOLN Take 5 mLs by mouth every 3 (three) days.    [provider]  metFORMIN (GLUCOPHAGE) 500 MG tablet Take 1 tablet (500 mg total) by mouth 2 (two) times daily. 06/10/17   Caren Macadam, MD  metoprolol succinate (TOPROL-XL) 25 MG 24 hr tablet Take 25 mg by mouth every evening.     [provider]  mirabegron ER (MYRBETRIQ) 50 MG TB24 tablet Take 50 mg by mouth daily.    [provider]  Multiple Vitamin (MULTIVITAMIN) tablet Take 1 tablet by mouth daily.      [provider]  neomycin-bacitracin-polymyxin (NEOSPORIN) ointment Apply 1 application topically as needed for wound care.    [provider]  nitroGLYCERIN (NITROSTAT) 0.4 MG SL tablet Place 0.4 mg under the tongue every 5 (five) minutes as needed for chest pain.    [provider]  Omega-3 Fatty Acids (FISH OIL) 1200 MG CAPS Take 1,200 mg by mouth daily.    [provider]  ondansetron (ZOFRAN) 4 MG tablet Take 1 tablet (4 mg total) by mouth every 8 (eight) hours as needed for nausea or vomiting. Patient not taking: Reported on 10/05/2018 08/02/18   Prudencio Burly III, PA-C  pravastatin (PRAVACHOL) 10 MG tablet Take 10 mg by mouth every evening.  09/04/16   [provider]  rOPINIRole (REQUIP) 0.5 MG tablet Take 2 tablets (1 mg total) by mouth at bedtime. 06/10/17   Caren Macadam, MD  spironolactone (ALDACTONE) 25 MG tablet Take 1 tablet (25 mg total) by mouth daily. Patient taking differently: Take 25 mg by mouth every evening.  06/10/17   Caren Macadam, MD  triamterene-hydrochlorothiazide (MAXZIDE-25) 37.5-25 MG tablet Take 1 tablet by mouth daily. 05/11/17   Caren Macadam, MD  rosuvastatin (CRESTOR) 40 MG tablet Take 1 tablet  (40 mg total) by mouth daily. 11/26/10 04/08/11  Theora Gianotti, NP    Allergies    Patient has no known allergies.  Review of Systems   Review of Systems  Constitutional:  Negative for fever.  Eyes:  Negative for visual disturbance.  Respiratory:  Negative for cough and shortness of breath.   Cardiovascular:  Negative for chest pain.  Gastrointestinal:  Negative for abdominal pain, nausea and vomiting.  Genitourinary:  Negative for difficulty urinating.  Musculoskeletal:  Negative for back pain and neck stiffness.  Skin:  Negative for rash.  Neurological:  Positive for speech difficulty. Negative for dizziness, seizures, syncope, facial asymmetry, weakness, light-headedness and headaches.   Physical Exam Updated Vital Signs BP (!) 175/92   Pulse 81   Temp 98 F (36.7 C) (Temporal)   Resp (!) 28   Ht '5\' 8"'$  (1.727 m)   Wt 84.1 kg   SpO2 96%   BMI 28.19 kg/m   Physical Exam Constitutional:      General: He is not in acute distress.    Appearance: Normal appearance. He is not ill-appearing.  HENT:     Head: Normocephalic and atraumatic.  Eyes:     General: No visual field deficit.    Extraocular Movements: Extraocular movements intact.     Conjunctiva/sclera: Conjunctivae normal.     Pupils: Pupils are equal, round, and reactive to light.  Cardiovascular:     Rate and Rhythm: Normal rate and regular rhythm.     Pulses: Normal pulses.  Pulmonary:     Effort: Pulmonary effort is normal. No respiratory distress.  Abdominal:     General: Abdomen is flat. There is no distension.     Tenderness: There is no abdominal tenderness.  Musculoskeletal:        General: No swelling or tenderness.     Cervical back: Normal range of motion.  Skin:    General: Skin is warm and dry.     Findings: No erythema or rash.  Neurological:     General: No focal deficit present.     Mental Status: He is alert and oriented to person, place, and time.     GCS: GCS eye subscore is  4. GCS verbal subscore is 5. GCS motor subscore is 6.     Cranial Nerves: No cranial nerve deficit, dysarthria or facial asymmetry.     Sensory: No sensory  deficit.     Motor: No weakness or tremor.     Coordination: Coordination normal. Finger-Nose-Finger Test normal.     Gait: Gait normal.    ED Results / Procedures / Treatments   Labs (all labs ordered are listed, but only abnormal results are displayed) Labs Reviewed  CBC - Abnormal; Notable for the following components:      Result Value   Hemoglobin 10.6 (*)    HCT 34.7 (*)    MCV 74.3 (*)    MCH 22.7 (*)    RDW 15.9 (*)    Platelets 428 (*)    All other components within normal limits  COMPREHENSIVE METABOLIC PANEL - Abnormal; Notable for the following components:   CO2 21 (*)    All other components within normal limits  HEMOGLOBIN A1C - Abnormal; Notable for the following components:   Hgb A1c MFr Bld 7.0 (*)    All other components within normal limits  LIPID PANEL - Abnormal; Notable for the following components:   Triglycerides 327 (*)    HDL 27 (*)    VLDL 65 (*)    All other components within normal limits  I-STAT CHEM 8, ED - Abnormal; Notable for the following components:   Hemoglobin 11.2 (*)    HCT 33.0 (*)    All other components within normal limits  RESP PANEL BY RT-PCR (FLU A&B, COVID) ARPGX2  ETHANOL  PROTIME-INR  APTT  DIFFERENTIAL  RAPID URINE DRUG SCREEN, HOSP PERFORMED  URINALYSIS, ROUTINE W REFLEX MICROSCOPIC    EKG EKG Interpretation  Date/Time:  Thursday August 15 2020 14:17:41 EDT Ventricular Rate:  67 PR Interval:  260 QRS Duration: 161 QT Interval:  467 QTC Calculation: 493 R Axis:   -69 Text Interpretation: Sinus rhythm Atrial premature complexes Prolonged PR interval RBBB and LAFB No significant change since last tracing Confirmed by Gareth Morgan [phone]) on 08/15/2020 9:46:21 PM  Radiology MR ANGIO HEAD WO CONTRAST  Result Date: 08/15/2020 CLINICAL DATA:  Neuro deficit,  acute, stroke suspected EXAM: MRA HEAD WITHOUT CONTRAST TECHNIQUE: Angiographic images of the Circle of Willis were acquired using MRA technique without intravenous contrast. COMPARISON:  November 2018 FINDINGS: Anterior circulation: Intracranial internal carotid arteries are patent. Anterior and middle cerebral arteries are patent. Posterior circulation: Intracranial vertebral arteries are patent. Left vertebral artery is dominant. Basilar artery is patent. Major cerebellar artery origins are patent. Posterior cerebral arteries are patent. There is unchanged moderate to marked right P2 PCA stenosis. IMPRESSION: No proximal intracranial vessel occlusion. Unchanged right P2 PCA stenosis. Electronically Signed   By: Macy Mis M.D.   On: 08/15/2020 16:31   MR ANGIO NECK WO CONTRAST  Result Date: 08/15/2020 CLINICAL DATA:  Acute neuro deficit. EXAM: MRA NECK WITHOUT CONTRAST TECHNIQUE: Angiographic images of the neck were acquired using MRA technique without intravenous contrast. Carotid stenosis measurements (when applicable) are obtained utilizing NASCET criteria, using the distal internal carotid diameter as the denominator. COMPARISON:  MRI head 08/15/2020 FINDINGS: Aortic arch: Not image Right carotid system: No significant stenosis. Possible plaque ulceration in the right carotid bulb. Correlate with any symptoms of emboli. Remainder of the right internal carotid artery widely patent Left carotid system: Mild atherosclerotic disease left carotid bifurcation without significant stenosis. Vertebral arteries: Left vertebral artery dominant and widely patent to the basilar. Right vertebral artery is small and ends in PICA. Other: None. IMPRESSION: No significant carotid stenosis. Possible ulceration a plaque in the right carotid bulb. Correlate with any symptoms of emboli.  No significant vertebral artery stenosis. Right vertebral artery ends in PICA. Electronically Signed   By: Franchot Gallo M.D.   On:  08/15/2020 16:50   MR BRAIN WO CONTRAST  Result Date: 08/15/2020 CLINICAL DATA:  Acute neuro deficit. EXAM: MRI HEAD WITHOUT CONTRAST TECHNIQUE: Multiplanar, multiecho pulse sequences of the brain and surrounding structures were obtained without intravenous contrast. COMPARISON:  CT head 04/26/2017 FINDINGS: Brain: Generalized atrophy without hydrocephalus. Extensive white matter changes with diffuse white matter hyperintensity throughout both cerebral hemispheres. Chronic infarcts in the thalamus bilaterally. Mild hyperintensity in the pons. Small chronic infarct left cerebellum. Negative for acute infarct, hemorrhage, mass Vascular: Normal arterial flow voids. Skull and upper cervical spine: Negative Sinuses/Orbits: Mild mucosal edema paranasal sinuses. Negative orbit Other: None IMPRESSION: Negative for acute infarct Atrophy and extensive chronic microvascular ischemic change. Electronically Signed   By: Franchot Gallo M.D.   On: 08/15/2020 16:57    Procedures Procedures   Medications Ordered in ED Medications  labetalol (NORMODYNE) injection 5 mg (5 mg Intravenous Given 08/15/20 1439)    ED Course  I have reviewed the triage vital signs and the nursing notes.  Pertinent labs & imaging results that were available during my care of the patient were reviewed by me and considered in my medical decision making (see chart for details).    MDM Rules/Calculators/A&P                             85 year old male with history of coronary artery disease, CVA, history of TIAs, diabetes, melanoma, diverticular bleed, hypertension, right rotator cuff pathology, presents with concern for episode of difficulty speaking.  Glucose normal with EMS.  He arrives to the emergency department with significant hypertension, however without focal deficits on exam to suggest CVA at this time.  History is most consistent with TIA, or possible episode of encephalopathy secondary to hypertension.  Labs and CT ordered  and neurology consulted.  Care signed out with work-up pending.  Dr. Lorrin Goodell evaluated patient, plan to obtain MRI and possible outpatient work up if negative as patient reports wife needs his assistance at home.   Final Clinical Impression(s) / ED Diagnoses Final diagnoses:  TIA (transient ischemic attack)  Hypertension, unspecified type    Rx / DC Orders ED Discharge Orders     None        Gareth Morgan, MD 08/15/20 2151

## 2020-08-15 NOTE — Discharge Instructions (Addendum)
Return for any new or worse symptoms.  Make an appointment to follow-up with [facility].

## 2020-08-20 ENCOUNTER — Encounter: Payer: Self-pay | Admitting: Diagnostic Neuroimaging

## 2020-08-20 ENCOUNTER — Ambulatory Visit: Payer: Medicare HMO | Admitting: Diagnostic Neuroimaging

## 2020-08-20 ENCOUNTER — Other Ambulatory Visit: Payer: Self-pay

## 2020-08-20 VITALS — BP 142/84 | HR 61 | Ht 68.0 in | Wt 182.0 lb

## 2020-08-20 DIAGNOSIS — G459 Transient cerebral ischemic attack, unspecified: Secondary | ICD-10-CM

## 2020-08-20 NOTE — Progress Notes (Signed)
[facility]  PATIENT: Steve Ramirez DOB: April 05, 1935  REFERRING CLINICIAN: Sharilyn Sites, MD  HISTORY FROM: patient and wife and chart review REASON FOR VISIT: new consult    HISTORICAL  CHIEF COMPLAINT:  Chief Complaint  Patient presents with   Transient Ischemic Attack    RM 7 with spouse linda Pt is well and stable, no complaints     HISTORY OF PRESENT ILLNESS:   UPDATE (08/20/20, VRP): Since last visit, doing well until 08/15/20, had 20-30 minutes aphasia and right hand clumsiness. Went to ER and had TIA workup. Now back at baseline.   PRIOR HPI (12/15/16): 85 year old male with hypertension, diabetes, hyperglycemia, heart disease, depression, here for evaluation of recurrent strokes.  Patient admitted to hospital in September 2017 for left leg weakness, diagnosed with right pontine ischemic infarction.  Patient due to gait difficulty and veering to the right side.  He noted some slurred speech and trouble swallowing on December 01, 2016.  He was evaluated and recommended to be admitted and possibly transferred to [facility].  However hospital notes indicate the patient wanted to leave [place].  He declined transfer to [facility] but decided to stay at [facility].  This is based on hospital note reviewed.  However patient tells me today that he did not refused to stay in the hospital.  He was discharged on 12/03/16.  At home later that day patient slumped, had weakness, and was taken to the hospital again by ambulance.  Patient was admitted to hospital.  He was found to have acute left posterior limb of internal capsule lacunar ischemic infarction.  Stroke workup was completed.  Patient was also found to have significant intracranial atherosclerosis.  Medical management was advised.  Syncope was felt to be due to hypotension and intracranial stenoses.  Since that time patient is improved.  His right-sided weakness has resolved but he  still has some difficulty with handwriting in his right hand.  He has more mood and emotional lability since the stroke.  He is tolerating his medications.  He is currently on aspirin and Plavix, blood pressure control, cholesterol medication.  Previously he was only on Plavix.     REVIEW OF SYSTEMS: Full 14 system review of systems performed and negative with exception of: Slurred speech restless legs depression cramps runny nose incontinence wheezing.  ALLERGIES: No Known Allergies  HOME MEDICATIONS: Outpatient Medications Prior to Visit  Medication Sig Dispense Refill   aspirin EC 81 MG tablet Take 1 tablet (81 mg total) by mouth 2 (two) times daily. For DVT prophylaxis for 30 days after surgery. 60 tablet 0   baclofen (LIORESAL) 10 MG tablet Take 1 tablet (10 mg total) by mouth 3 (three) times daily as needed for muscle spasms. 20 each 0   cetirizine (ZYRTEC) 10 MG tablet Take 10 mg by mouth daily.     cloNIDine (CATAPRES - DOSED IN MG/24 HR) 0.3 mg/24hr patch Place 1 patch (0.3 mg total) onto the skin once a week. (Patient taking differently: Place 0.3 mg onto the skin every Saturday.) 4 patch 12   clopidogrel (PLAVIX) 75 MG tablet Take 1 tablet (75 mg total) by mouth daily. 90 tablet 0   escitalopram (LEXAPRO) 10 MG tablet Take 1 tablet (10 mg total) by mouth daily. 90 tablet 0   fenofibrate (TRICOR) 145 MG tablet TAKE 1 TABLET BY MOUTH ONCE DAILY (Patient taking differently: Take 145 mg by mouth every evening.) 90 tablet 0  isosorbide mononitrate (IMDUR) 30 MG 24 hr tablet Take 30 mg by mouth every evening.      levothyroxine (SYNTHROID, LEVOTHROID) 50 MCG tablet Take 1 tablet (50 mcg total) by mouth daily. (Patient taking differently: Take 50 mcg by mouth every evening.) 90 tablet 0   lisinopril (PRINIVIL,ZESTRIL) 40 MG tablet Take 1 tablet (40 mg total) by mouth daily. 90 tablet 3   metFORMIN (GLUCOPHAGE) 500 MG tablet Take 1 tablet (500 mg total) by mouth 2 (two) times daily. 180  tablet 0   metoprolol succinate (TOPROL-XL) 25 MG 24 hr tablet Take 25 mg by mouth every evening.      mirabegron ER (MYRBETRIQ) 50 MG TB24 tablet Take 50 mg by mouth daily.     Multiple Vitamin (MULTIVITAMIN) tablet Take 1 tablet by mouth daily.       neomycin-bacitracin-polymyxin (NEOSPORIN) ointment Apply 1 application topically as needed for wound care.     nitroGLYCERIN (NITROSTAT) 0.4 MG SL tablet Place 0.4 mg under the tongue every 5 (five) minutes as needed for chest pain.     Omega-3 Fatty Acids (FISH OIL) 1200 MG CAPS Take 1,200 mg by mouth daily.     pravastatin (PRAVACHOL) 10 MG tablet Take 10 mg by mouth every evening.      rOPINIRole (REQUIP) 0.5 MG tablet Take 2 tablets (1 mg total) by mouth at bedtime. 60 tablet 0   spironolactone (ALDACTONE) 25 MG tablet Take 1 tablet (25 mg total) by mouth daily. (Patient taking differently: Take 25 mg by mouth every evening.) 90 tablet 0   magnesium citrate SOLN Take 5 mLs by mouth every 3 (three) days.     docusate sodium (COLACE) 100 MG capsule Take 1 capsule (100 mg total) by mouth 2 (two) times daily. To prevent constipation while taking pain medication. (Patient not taking: Reported on 08/20/2020) 30 capsule 0   gabapentin (NEURONTIN) 100 MG capsule Take 1 capsule (100 mg total) by mouth 2 (two) times daily for 14 days. For pain. (Patient not taking: Reported on 10/05/2018) 28 capsule 0   ondansetron (ZOFRAN) 4 MG tablet Take 1 tablet (4 mg total) by mouth every 8 (eight) hours as needed for nausea or vomiting. (Patient not taking: Reported on 08/20/2020) 20 tablet 0   triamterene-hydrochlorothiazide (MAXZIDE-25) 37.5-25 MG tablet Take 1 tablet by mouth daily. (Patient not taking: Reported on 08/20/2020) 90 tablet 3   No facility-administered medications prior to visit.    PAST MEDICAL HISTORY: Past Medical History:  Diagnosis Date   Coronary atherosclerosis of native coronary artery    Essential hypertension, benign    GIB (gastrointestinal  bleeding)    Colonic diverticular bleed   History of melanoma    History of TIAs    Stroke (HCC)    Type 2 diabetes mellitus ([place])     PAST SURGICAL HISTORY: Past Surgical History:  Procedure Laterality Date   COLONOSCOPY  08/13/2010   Procedure: COLONOSCOPY;  Surgeon: Rogene Houston, MD;  Location: [facility];  Service: Endoscopy;  Laterality: N/A;   JOINT REPLACEMENT     KNEE SURGERY     arthroscopic   LEFT HEART CATHETERIZATION WITH CORONARY ANGIOGRAM N/A 11/24/2010   Procedure: LEFT HEART CATHETERIZATION WITH CORONARY ANGIOGRAM;  Surgeon: Josue Hector, MD;  Location: [facility] CATH LAB;  Service: Cardiovascular;  Laterality: N/A;   neck fusion     years ago   TOTAL HIP ARTHROPLASTY Right 08/02/2018   Procedure: TOTAL HIP ARTHROPLASTY ANTERIOR APPROACH;  Surgeon: [name]  D, MD;  Location: [facility];  Service: Orthopedics;  Laterality: Right;   TOTAL HIP ARTHROPLASTY Left 10/18/2018   Procedure: TOTAL HIP ARTHROPLASTY ANTERIOR APPROACH;  Surgeon: Renette Butters, MD;  Location: [facility];  Service: Orthopedics;  Laterality: Left;    FAMILY HISTORY: Family History  Problem Relation Age of Onset   Coronary artery disease Father    Asthma Other    Emphysema Mother    Coronary artery disease Paternal Uncle    Brain cancer Brother     SOCIAL HISTORY:  Social History   Socioeconomic History   Marital status: Married    Spouse name: Not on file   Number of children: Not on file   Years of education: Not on file   Highest education level: Not on file  Occupational History   Occupation: retired  Tobacco Use   Smoking status: Former    Types: Cigarettes    Quit date: 01/13/1980    Years since quitting: 40.6   Smokeless tobacco: Never  Vaping Use   Vaping Use: Never used  Substance and Sexual Activity   Alcohol use: Yes    Comment: occasionally   Drug use: No   Sexual activity: Not Currently    Partners: Female  Other Topics Concern   Not on file  Social History  Narrative   Lives with wife. Grew up in [place] and after college moved to [place]. Got married. Moved to [place] and then to [place], [place].    Attends [employer].    Masters health education, administration   3 children   Caffeine- 1 pot coffee daily.   Daughter is a Education officer, museum. Son is a Higher education careers adviser. Son is in [employer] [place] and [employer].       Social Determinants of Health   Financial Resource Strain: Not on file  Food Insecurity: Not on file  Transportation Needs: Not on file  Physical Activity: Not on file  Stress: Not on file  Social Connections: Not on file  Intimate Partner Violence: Not on file     PHYSICAL EXAM  GENERAL EXAM/CONSTITUTIONAL: Vitals:  Vitals:   08/20/20 0906  BP: (!) 142/84  Pulse: 61  Weight: 182 lb (82.6 kg)  Height: '5\' 8"'$  (1.727 m)   Body mass index is 27.67 kg/m. No results found.  Patient is in no distress; well developed, nourished and groomed; neck is supple  CARDIOVASCULAR: Examination of carotid arteries is normal; no carotid bruits Regular rate and rhythm, no murmurs Examination of peripheral vascular system by observation and palpation is normal  EYES: Ophthalmoscopic exam of optic discs and posterior segments is normal; no papilledema or hemorrhages  MUSCULOSKELETAL: Gait, strength, tone, movements noted in Neurologic exam below  NEUROLOGIC: MENTAL STATUS:  No flowsheet data found. awake, alert, oriented to person, place and time recent and remote memory intact normal attention and concentration language fluent, comprehension intact, naming intact,  fund of knowledge appropriate  CRANIAL NERVE:  2nd - no papilledema on fundoscopic exam 2nd, 3rd, 4th, 6th - pupils equal and reactive to light, visual fields full to confrontation, extraocular muscles intact, no nystagmus 5th - facial sensation symmetric 7th - facial strength symmetric 8th - hearing intact 9th - palate elevates symmetrically, uvula  midline 11th - shoulder shrug symmetric 12th - tongue protrusion midline MILD SLURRED SPEECH  MOTOR:  normal bulk and tone, full strength in the BUE, BLE  SENSORY:  normal and symmetric to light touch, temperature, vibration  COORDINATION:  finger-nose-finger, fine finger movements normal  REFLEXES:  deep tendon reflexes --> BUE 2, KNEES 1, ANKLES TRACE  GAIT/STATION:  WIDE BASED SLOW GAIT; CAUTIOUS    DIAGNOSTIC DATA (LABS, IMAGING, TESTING) - I reviewed patient records, labs, notes, testing and imaging myself where available.  Lab Results  Component Value Date   WBC 6.2 08/15/2020   HGB 11.2 (L) 08/15/2020   HCT 33.0 (L) 08/15/2020   MCV 74.3 (L) 08/15/2020   PLT 428 (H) 08/15/2020      Component Value Date/Time   NA 139 08/15/2020 1436   K 4.1 08/15/2020 1436   CL 107 08/15/2020 1436   CO2 21 (L) 08/15/2020 1418   GLUCOSE 98 08/15/2020 1436   BUN 19 08/15/2020 1436   CREATININE 0.80 08/15/2020 1436   CREATININE 1.03 05/07/2017 0917   CALCIUM 9.3 08/15/2020 1418   PROT 6.9 08/15/2020 1418   ALBUMIN 4.1 08/15/2020 1418   AST 16 08/15/2020 1418   ALT 14 08/15/2020 1418   ALKPHOS 57 08/15/2020 1418   BILITOT 0.5 08/15/2020 1418   GFRNONAA >60 08/15/2020 1418   GFRNONAA 69 05/07/2017 0917   GFRAA >60 10/10/2018 1011   GFRAA 80 05/07/2017 0917   Lab Results  Component Value Date   CHOL 116 08/15/2020   HDL 27 (L) 08/15/2020   LDLCALC 24 08/15/2020   TRIG 327 (H) 08/15/2020   CHOLHDL 4.3 08/15/2020   Lab Results  Component Value Date   HGBA1C 7.0 (H) 08/15/2020   Lab Results  Component Value Date   VITAMINB12 468 05/07/2017   Lab Results  Component Value Date   TSH 2.33 05/07/2017   09/13/15 MRI brain [I reviewed images myself and agree with interpretation. -VRP]  - Correction to the impression of the report. The first line should read "Acute infarct right lateral pons" , not thalamus - Acute infarct right lateral - Small acute infarct left  medial frontal white matter - Extensive chronic microvascular ischemic changes stable since 2016.  09/25/15 cardiac monitor - Cardiac monitor demonstrated NSR without arrhythmias or pauses.  12/02/16 MRI / MRA brain [I reviewed images myself and agree with interpretation. -VRP]  1. Small acute lacunar infarcts in the left lenticulostriate artery territory. No associated hemorrhage or mass effect. The dominant infarct extends from the left corona radiata to the posterior lentiform. 2. Underlying severe chronic small vessel ischemia is otherwise stable since 2017. 3. 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 - progressed left MCA M2 branch stenosis, now moderate. - moderate to severe chronic stenosis of the right PCA. - chronic mild to moderate stenosis at the left ACA origin and right MCA bifurcation.   12/03/16 CTA head  1. Moderate left M1 segment stenosis. Atheromatous type irregularity of this vessel is more prominent than on MRA yesterday. This could be due to differences in technique or from fluctuating plaque in this patient with a known acute left basal ganglia infarct. 2. Otherwise stable advanced intracranial atherosclerosis as described above. The most severe narrowing involves the right P2 segment and left A1 segment origin. Moderate narrowing of the mid basilar and left P2 segment.  12/02/16 carotid u/s  1. Diffuse atherosclerotic plaque in the cervical carotids. 2. Elevated right ICA velocities in the lower end of the 50-69% stenotic range. 3. Less than 50% left ICA stenosis. 4. Antegrade flow in both vertebral arteries.  12/03/16 CTA neck 1. No acute finding. No vertebral occlusion or  insufficiency to explain syncope. 2. Diffuse atherosclerosis with 30-40% proximal ICA narrowing bilaterally. 3. Advanced right and  mild-to-moderate left V1 segment stenosis.  12/04/16 TTE - Left ventricle: Posterior basal hypokinesis The cavity size was   normal. Wall thickness was increased in a pattern of moderate   LVH. Systolic function was normal. The estimated ejection   fraction was in the range of 50% to 55%. Wall motion was normal;   there were no regional wall motion abnormalities. Left   ventricular diastolic function parameters were normal. - Aortic valve: There was trivial regurgitation.  08/15/20 MRI brain - Negative for acute infarct - Atrophy and extensive chronic microvascular ischemic change.  08/15/20 MRA head - No proximal intracranial vessel occlusion. Unchanged right P2 PCA stenosis.  08/15/20 MRA neck  - No significant carotid stenosis. Possible ulceration a plaque in the right carotid bulb. Correlate with any symptoms of emboli. - No significant vertebral artery stenosis. Right vertebral artery ends in PICA.  ASSESSMENT AND PLAN  85 y.o. year old male here with:  Dx:   1. Advanced small vessel ischemic disease --> left internal capsule and right pontine chronic lacunar ischemic infarcts 2. Advanced large vessel atherosclerosis  1. TIA (transient ischemic attack)      PLAN:  TIA - echocardiogram to complete workup - continue aspirin '81mg'$  + plavix '75mg'$  daily (continue dual anti-platelet long term due to recurrent strokes on single anti-platelet therapy) - continue BP control; ok to keep goal SBP 120-140 due to severe intracranial atherosclerosis and near syncope in the past - continue pravastatin - stroke warning signs and education reviewed  Orders Placed This Encounter  Procedures   ECHOCARDIOGRAM COMPLETE BUBBLE STUDY   Return for return to PCP, pending if symptoms worsen or fail to improve.    Penni Bombard, MD [id number], [id number] AM Certified in Neurology, Neurophysiology and Neuroimaging  [facility] [address], [place], [place]  [id number] [phone]

## 2020-09-17 ENCOUNTER — Other Ambulatory Visit: Payer: Medicare HMO

## 2020-09-20 ENCOUNTER — Other Ambulatory Visit: Payer: Medicare HMO

## 2020-10-03 ENCOUNTER — Emergency Department: Payer: Medicare Other

## 2020-10-03 ENCOUNTER — Observation Stay
Admission: EM | Admit: 2020-10-03 | Discharge: 2020-10-05 | Disposition: A | Discharge status: Home / Self Care | Payer: Medicare Other | Attending: Internal Medicine | Admitting: Internal Medicine

## 2020-10-03 ENCOUNTER — Other Ambulatory Visit: Payer: Self-pay

## 2020-10-03 ENCOUNTER — Observation Stay: Payer: Medicare Other

## 2020-10-03 ENCOUNTER — Encounter: Payer: Self-pay | Admitting: Internal Medicine

## 2020-10-03 DIAGNOSIS — Z8673 Personal history of transient ischemic attack (TIA), and cerebral infarction without residual deficits: Secondary | ICD-10-CM

## 2020-10-03 DIAGNOSIS — R4182 Altered mental status, unspecified: Secondary | ICD-10-CM | POA: Insufficient documentation

## 2020-10-03 DIAGNOSIS — I639 Cerebral infarction, unspecified: Secondary | ICD-10-CM

## 2020-10-03 DIAGNOSIS — Z7982 Long term (current) use of aspirin: Secondary | ICD-10-CM | POA: Insufficient documentation

## 2020-10-03 DIAGNOSIS — I2510 Atherosclerotic heart disease of native coronary artery without angina pectoris: Secondary | ICD-10-CM | POA: Diagnosis not present

## 2020-10-03 DIAGNOSIS — R29898 Other symptoms and signs involving the musculoskeletal system: Secondary | ICD-10-CM | POA: Insufficient documentation

## 2020-10-03 DIAGNOSIS — Z20822 Contact with and (suspected) exposure to covid-19: Secondary | ICD-10-CM | POA: Diagnosis not present

## 2020-10-03 DIAGNOSIS — Z7902 Long term (current) use of antithrombotics/antiplatelets: Secondary | ICD-10-CM | POA: Insufficient documentation

## 2020-10-03 DIAGNOSIS — R4701 Aphasia: Secondary | ICD-10-CM | POA: Insufficient documentation

## 2020-10-03 DIAGNOSIS — R2689 Other abnormalities of gait and mobility: Secondary | ICD-10-CM | POA: Insufficient documentation

## 2020-10-03 DIAGNOSIS — Z96643 Presence of artificial hip joint, bilateral: Secondary | ICD-10-CM | POA: Diagnosis not present

## 2020-10-03 DIAGNOSIS — E785 Hyperlipidemia, unspecified: Secondary | ICD-10-CM | POA: Diagnosis present

## 2020-10-03 DIAGNOSIS — E039 Hypothyroidism, unspecified: Secondary | ICD-10-CM

## 2020-10-03 DIAGNOSIS — I10 Essential (primary) hypertension: Secondary | ICD-10-CM | POA: Diagnosis not present

## 2020-10-03 DIAGNOSIS — Z87891 Personal history of nicotine dependence: Secondary | ICD-10-CM | POA: Insufficient documentation

## 2020-10-03 DIAGNOSIS — Z79899 Other long term (current) drug therapy: Secondary | ICD-10-CM | POA: Diagnosis not present

## 2020-10-03 DIAGNOSIS — E119 Type 2 diabetes mellitus without complications: Secondary | ICD-10-CM

## 2020-10-03 DIAGNOSIS — R29810 Facial weakness: Secondary | ICD-10-CM | POA: Insufficient documentation

## 2020-10-03 DIAGNOSIS — G459 Transient cerebral ischemic attack, unspecified: Principal | ICD-10-CM

## 2020-10-03 DIAGNOSIS — G9341 Metabolic encephalopathy: Secondary | ICD-10-CM | POA: Diagnosis not present

## 2020-10-03 DIAGNOSIS — F32A Depression, unspecified: Secondary | ICD-10-CM | POA: Diagnosis present

## 2020-10-03 LAB — CBC WITH DIFFERENTIAL/PLATELET
Abs Immature Granulocytes: 0.01 10*3/uL (ref 0.00–0.07)
Basophils Absolute: 0.1 10*3/uL (ref 0.0–0.1)
Basophils Relative: 1 %
Eosinophils Absolute: 0.3 10*3/uL (ref 0.0–0.5)
Eosinophils Relative: 4 %
HCT: 36.9 % — ABNORMAL LOW (ref 39.0–52.0)
Hemoglobin: 11.3 g/dL — ABNORMAL LOW (ref 13.0–17.0)
Immature Granulocytes: 0 %
Lymphocytes Relative: 15 %
Lymphs Abs: 1 10*3/uL (ref 0.7–4.0)
MCH: 22.2 pg — ABNORMAL LOW (ref 26.0–34.0)
MCHC: 30.6 g/dL (ref 30.0–36.0)
MCV: 72.4 fL — ABNORMAL LOW (ref 80.0–100.0)
Monocytes Absolute: 0.5 10*3/uL (ref 0.1–1.0)
Monocytes Relative: 8 %
Neutro Abs: 4.7 10*3/uL (ref 1.7–7.7)
Neutrophils Relative %: 72 %
Platelets: 301 10*3/uL (ref 150–400)
RBC: 5.1 MIL/uL (ref 4.22–5.81)
RDW: 17.9 % — ABNORMAL HIGH (ref 11.5–15.5)
WBC: 6.5 10*3/uL (ref 4.0–10.5)
nRBC: 0 % (ref 0.0–0.2)

## 2020-10-03 LAB — COMPREHENSIVE METABOLIC PANEL
ALT: 13 U/L (ref 0–44)
AST: 16 U/L (ref 15–41)
Albumin: 4.1 g/dL (ref 3.5–5.0)
Alkaline Phosphatase: 59 U/L (ref 38–126)
Anion gap: 11 (ref 5–15)
BUN: 14 mg/dL (ref 8–23)
CO2: 24 mmol/L (ref 22–32)
Calcium: 9.6 mg/dL (ref 8.9–10.3)
Chloride: 100 mmol/L (ref 98–111)
Creatinine, Ser: 0.76 mg/dL (ref 0.61–1.24)
GFR, Estimated: >60 mL/min (ref >60)
Glucose, Bld: 171 mg/dL — ABNORMAL HIGH (ref 70–99)
Potassium: 4 mmol/L (ref 3.5–5.1)
Sodium: 135 mmol/L (ref 135–145)
Total Bilirubin: 0.8 mg/dL (ref 0.3–1.2)
Total Protein: 7.4 g/dL (ref 6.5–8.1)

## 2020-10-03 LAB — URINALYSIS, COMPLETE (UACMP) WITH MICROSCOPIC
Bacteria, UA: NONE SEEN
Bilirubin Urine: NEGATIVE
Glucose, UA: NEGATIVE mg/dL
Hgb urine dipstick: NEGATIVE
Ketones, ur: NEGATIVE mg/dL
Leukocytes,Ua: NEGATIVE
Nitrite: NEGATIVE
Protein, ur: NEGATIVE mg/dL
Specific Gravity, Urine: 1.014 (ref 1.005–1.030)
pH: 7 (ref 5.0–8.0)

## 2020-10-03 LAB — GLUCOSE, CAPILLARY: Glucose-Capillary: 115 mg/dL — ABNORMAL HIGH (ref 70–99)

## 2020-10-03 LAB — RESP PANEL BY RT-PCR (FLU A&B, COVID) ARPGX2
Influenza A by PCR: NEGATIVE
Influenza B by PCR: NEGATIVE
SARS Coronavirus 2 by RT PCR: NEGATIVE

## 2020-10-03 LAB — LACTIC ACID, PLASMA: Lactic Acid, Venous: 1.9 mmol/L (ref 0.5–1.9)

## 2020-10-03 LAB — APTT: aPTT: 29 seconds (ref 24–36)

## 2020-10-03 LAB — PROTIME-INR
INR: 1.1 (ref 0.8–1.2)
Prothrombin Time: 14.1 seconds (ref 11.4–15.2)

## 2020-10-03 MED ORDER — FERROUS SULFATE 325 (65 FE) MG PO TABS
325.0000 mg | ORAL_TABLET | Freq: Every day | ORAL | Status: DC
  Administered 2020-10-04 – 2020-10-05 (×2): 325 mg via ORAL
  Filled 2020-10-03 (×2): qty 1

## 2020-10-03 MED ORDER — CLONIDINE HCL 0.3 MG/24HR TD PTWK
0.3000 mg | MEDICATED_PATCH | TRANSDERMAL | Status: DC
  Administered 2020-10-05: 09:00:00 0.3 mg via TRANSDERMAL
  Filled 2020-10-03: qty 1

## 2020-10-03 MED ORDER — ACETAMINOPHEN 650 MG RE SUPP: 650.0000 mg | RECTAL | Status: DC | PRN

## 2020-10-03 MED ORDER — ROPINIROLE HCL 1 MG PO TABS
1.0000 mg | ORAL_TABLET | Freq: Every day | ORAL | Status: DC
  Administered 2020-10-03 – 2020-10-04 (×2): 1 mg via ORAL
  Filled 2020-10-03 (×3): qty 1

## 2020-10-03 MED ORDER — ONDANSETRON HCL 4 MG/2ML IJ SOLN: 4.0000 mg | Freq: Three times a day (TID) | INTRAMUSCULAR | Status: DC | PRN

## 2020-10-03 MED ORDER — LEVOTHYROXINE SODIUM 50 MCG PO TABS
50.0000 ug | ORAL_TABLET | Freq: Every day | ORAL | Status: DC
  Administered 2020-10-04 – 2020-10-05 (×2): 50 ug via ORAL
  Filled 2020-10-03 (×2): qty 1

## 2020-10-03 MED ORDER — LEVOTHYROXINE SODIUM 50 MCG PO TABS: 50.0000 ug | ORAL_TABLET | Freq: Every day | ORAL | Status: DC

## 2020-10-03 MED ORDER — LISINOPRIL 20 MG PO TABS
40.0000 mg | ORAL_TABLET | Freq: Every day | ORAL | Status: DC
  Administered 2020-10-04 – 2020-10-05 (×2): 40 mg via ORAL
  Filled 2020-10-03: qty 2
  Filled 2020-10-03: qty 4
  Filled 2020-10-03: qty 2

## 2020-10-03 MED ORDER — OMEGA-3-ACID ETHYL ESTERS 1 G PO CAPS: 1.0000 g | ORAL_CAPSULE | Freq: Every day | ORAL | Status: DC

## 2020-10-03 MED ORDER — ADULT MULTIVITAMIN W/MINERALS CH
1.0000 | ORAL_TABLET | Freq: Every day | ORAL | Status: DC
  Administered 2020-10-04 – 2020-10-05 (×2): 1 via ORAL
  Filled 2020-10-03 (×2): qty 1

## 2020-10-03 MED ORDER — ESCITALOPRAM OXALATE 10 MG PO TABS
10.0000 mg | ORAL_TABLET | Freq: Every day | ORAL | Status: DC
  Administered 2020-10-04 – 2020-10-05 (×2): 10 mg via ORAL
  Filled 2020-10-03 (×2): qty 1

## 2020-10-03 MED ORDER — ESCITALOPRAM OXALATE 10 MG PO TABS: 10.0000 mg | ORAL_TABLET | Freq: Every day | ORAL | Status: DC

## 2020-10-03 MED ORDER — ACETAMINOPHEN 325 MG PO TABS: 650.0000 mg | ORAL_TABLET | ORAL | Status: DC | PRN

## 2020-10-03 MED ORDER — HYDRALAZINE HCL 20 MG/ML IJ SOLN
5.0000 mg | INTRAMUSCULAR | Status: DC | PRN
  Administered 2020-10-04: 5 mg via INTRAVENOUS
  Filled 2020-10-03: qty 1

## 2020-10-03 MED ORDER — ASPIRIN EC 81 MG PO TBEC
81.0000 mg | DELAYED_RELEASE_TABLET | Freq: Two times a day (BID) | ORAL | Status: DC
  Administered 2020-10-03 – 2020-10-05 (×5): 81 mg via ORAL
  Filled 2020-10-03 (×5): qty 1

## 2020-10-03 MED ORDER — INSULIN ASPART 100 UNIT/ML IJ SOLN
0.0000 [IU] | Freq: Three times a day (TID) | INTRAMUSCULAR | Status: DC
  Administered 2020-10-04: 18:00:00 2 [IU] via SUBCUTANEOUS
  Administered 2020-10-04: 1 [IU] via SUBCUTANEOUS
  Administered 2020-10-04 – 2020-10-05 (×2): 2 [IU] via SUBCUTANEOUS
  Filled 2020-10-03 (×3): qty 1

## 2020-10-03 MED ORDER — OMEGA-3-ACID ETHYL ESTERS 1 G PO CAPS
1.0000 g | ORAL_CAPSULE | Freq: Every day | ORAL | Status: DC
  Administered 2020-10-03 – 2020-10-04 (×2): 1 g via ORAL
  Filled 2020-10-03 (×3): qty 1

## 2020-10-03 MED ORDER — NITROGLYCERIN 0.4 MG SL SUBL: 0.4000 mg | SUBLINGUAL_TABLET | SUBLINGUAL | Status: DC | PRN

## 2020-10-03 MED ORDER — PRAVASTATIN SODIUM 20 MG PO TABS
20.0000 mg | ORAL_TABLET | Freq: Every evening | ORAL | Status: DC
  Administered 2020-10-03 – 2020-10-04 (×2): 20 mg via ORAL
  Filled 2020-10-03 (×2): qty 1

## 2020-10-03 MED ORDER — CLOPIDOGREL BISULFATE 75 MG PO TABS
75.0000 mg | ORAL_TABLET | Freq: Every day | ORAL | Status: DC
  Administered 2020-10-04 – 2020-10-05 (×2): 75 mg via ORAL
  Filled 2020-10-03 (×2): qty 1

## 2020-10-03 MED ORDER — STROKE: EARLY STAGES OF RECOVERY BOOK: Freq: Once | Status: DC

## 2020-10-03 MED ORDER — ISOSORBIDE MONONITRATE ER 30 MG PO TB24
30.0000 mg | ORAL_TABLET | Freq: Every evening | ORAL | Status: DC
  Administered 2020-10-03 – 2020-10-04 (×2): 30 mg via ORAL
  Filled 2020-10-03 (×2): qty 1

## 2020-10-03 MED ORDER — SODIUM CHLORIDE 0.9 % IV SOLN: INTRAVENOUS | Status: DC

## 2020-10-03 MED ORDER — SENNOSIDES-DOCUSATE SODIUM 8.6-50 MG PO TABS: 1.0000 | ORAL_TABLET | Freq: Every evening | ORAL | Status: DC | PRN

## 2020-10-03 MED ORDER — LORATADINE 10 MG PO TABS
10.0000 mg | ORAL_TABLET | Freq: Every day | ORAL | Status: DC
  Administered 2020-10-04 – 2020-10-05 (×2): 10 mg via ORAL
  Filled 2020-10-03 (×2): qty 1

## 2020-10-03 MED ORDER — INSULIN ASPART 100 UNIT/ML IJ SOLN: 0.0000 [IU] | Freq: Every day | INTRAMUSCULAR | Status: DC

## 2020-10-03 MED ORDER — ENOXAPARIN SODIUM 40 MG/0.4ML IJ SOSY
40.0000 mg | PREFILLED_SYRINGE | INTRAMUSCULAR | Status: DC
  Administered 2020-10-03 – 2020-10-04 (×2): 40 mg via SUBCUTANEOUS
  Filled 2020-10-03 (×2): qty 0.4

## 2020-10-03 MED ORDER — METOPROLOL SUCCINATE ER 25 MG PO TB24
25.0000 mg | ORAL_TABLET | Freq: Every evening | ORAL | Status: DC
  Administered 2020-10-03 – 2020-10-04 (×2): 25 mg via ORAL
  Filled 2020-10-03 (×2): qty 1

## 2020-10-03 MED ORDER — ACETAMINOPHEN 160 MG/5ML PO SOLN
650.0000 mg | ORAL | Status: DC | PRN
  Filled 2020-10-03: qty 20.3

## 2020-10-03 NOTE — ED Notes (Signed)
Pt removed all cords and turned off dinamap. Pt refusing VS at this time.

## 2020-10-03 NOTE — ED Notes (Signed)
Report messaged to [employer]

## 2020-10-03 NOTE — ED Notes (Signed)
Pt in MRI.

## 2020-10-03 NOTE — Consult Note (Signed)
NEURO HOSPITALIST CONSULT NOTE   Requestig physician: Dr. Blaine Hamper  Reason for Consult: TIA symptoms  History obtained from:   Patient and Chart     HPI:                                                                                                                                          Steve Ramirez is an 85 y.o. male with a PMHx of HTN, HLD, DM, multiple prior strokes and TIAs (3 strokes and 7 TIAs per his son), hypothyroidism, melanoma, depression, diverticular GI bleeding and CAD, who presented to the ED this morning with altered mental status, slurred speech and left facial droop. Symptoms were noted by his daughter in law while she was visiting him at his living facility [facility]). He was initially normal, then suddenly developed slurred speech, expressive aphasia and confusion.   In Triage, CBG was 222, BP 200/100, HR 110. He had right facial droop, slurred speech and was oriented to self only. His last stroke was in August. He is currently on a blood thinner.   CT head:  1. No evidence of acute intracranial abnormality. 2. Stable advanced chronic microvascular ischemic disease and remote infarcts.  MRI brain:  1. No acute intracranial hemorrhage or infarct. 2. Stable parenchymal volume loss and advanced chronic white matter microangiopathy.  MRA head: Unchanged focal stenosis of the right P2 segment. Mild to moderate focal stenosis midway along the left M1 segment is also noted.   EKG: Sinus rhythm with 1st degree A-V block, Left axis deviation, Right bundle branch block  Electrolyte panel shows no derangements other than a glucose level of 171. No leukocytosis on CBC, but is mildly anemic with microcytosis noted. APTT, PT and INR were normal. U/A hazy but otherwise unremarkable.   Of note, his wife is currently admitted for sepsis and meningitis.    Past Medical History:  Diagnosis Date   Coronary atherosclerosis of native coronary artery     Essential hypertension, benign    GIB (gastrointestinal bleeding)    Colonic diverticular bleed   History of melanoma    History of TIAs    Stroke [facility])    Type 2 diabetes mellitus ([place])     Past Surgical History:  Procedure Laterality Date   COLONOSCOPY  08/13/2010   Procedure: COLONOSCOPY;  Surgeon: Rogene Houston, MD;  Location: [facility];  Service: Endoscopy;  Laterality: N/A;   JOINT REPLACEMENT     KNEE SURGERY     arthroscopic   LEFT HEART CATHETERIZATION WITH CORONARY ANGIOGRAM N/A 11/24/2010   Procedure: LEFT HEART CATHETERIZATION WITH CORONARY ANGIOGRAM;  Surgeon: Josue Hector, MD;  Location: [facility] CATH LAB;  Service: Cardiovascular;  Laterality: N/A;   neck fusion     years ago  TOTAL HIP ARTHROPLASTY Right 08/02/2018   Procedure: TOTAL HIP ARTHROPLASTY ANTERIOR APPROACH;  Surgeon: Renette Butters, MD;  Location: [facility] ORS;  Service: Orthopedics;  Laterality: Right;   TOTAL HIP ARTHROPLASTY Left 10/18/2018   Procedure: TOTAL HIP ARTHROPLASTY ANTERIOR APPROACH;  Surgeon: Renette Butters, MD;  Location: [facility] ORS;  Service: Orthopedics;  Laterality: Left;    Family History  Problem Relation Age of Onset   Coronary artery disease Father    Asthma Other    Emphysema Mother    Coronary artery disease Paternal Uncle    Brain cancer Brother              Social History:  reports that he quit smoking about 40 years ago. His smoking use included cigarettes. He has never used smokeless tobacco. He reports current alcohol use. He reports that he does not use drugs.  No Known Allergies  MEDICATIONS:                                                                                                                      No current facility-administered medications on file prior to encounter.   Current Outpatient Medications on File Prior to Encounter  Medication Sig Dispense Refill   aspirin EC 81 MG tablet Take 1 tablet (81 mg total) by mouth 2 (two) times daily. For DVT  prophylaxis for 30 days after surgery. 60 tablet 0   cetirizine (ZYRTEC) 10 MG tablet Take 10 mg by mouth daily.     clopidogrel (PLAVIX) 75 MG tablet Take 1 tablet (75 mg total) by mouth daily. 90 tablet 0   escitalopram (LEXAPRO) 10 MG tablet Take 1 tablet (10 mg total) by mouth daily. 90 tablet 0   ferrous sulfate 325 (65 FE) MG tablet Take 325 mg by mouth daily with breakfast.     isosorbide mononitrate (IMDUR) 30 MG 24 hr tablet Take 30 mg by mouth every evening.      levothyroxine (SYNTHROID, LEVOTHROID) 50 MCG tablet Take 1 tablet (50 mcg total) by mouth daily. (Patient taking differently: Take 50 mcg by mouth daily before breakfast.) 90 tablet 0   lisinopril (PRINIVIL,ZESTRIL) 40 MG tablet Take 1 tablet (40 mg total) by mouth daily. 90 tablet 3   metFORMIN (GLUCOPHAGE) 500 MG tablet Take 1 tablet (500 mg total) by mouth 2 (two) times daily. (Patient taking differently: Take 1,000 mg by mouth 2 (two) times daily with a meal.) 180 tablet 0   metoprolol succinate (TOPROL-XL) 25 MG 24 hr tablet Take 25 mg by mouth every evening.      Multiple Vitamin (MULTIVITAMIN) tablet Take 1 tablet by mouth daily.       Omega-3 Fatty Acids (FISH OIL) 1200 MG CAPS Take 1,200 mg by mouth daily.     pravastatin (PRAVACHOL) 20 MG tablet Take 20 mg by mouth every evening.     rOPINIRole (REQUIP) 0.5 MG tablet Take 2 tablets (1 mg total) by mouth at bedtime. 60 tablet 0   spironolactone (  ALDACTONE) 25 MG tablet Take 1 tablet (25 mg total) by mouth daily. (Patient taking differently: Take 25 mg by mouth every other day.) 90 tablet 0   baclofen (LIORESAL) 10 MG tablet Take 1 tablet (10 mg total) by mouth 3 (three) times daily as needed for muscle spasms. (Patient not taking: No sig reported) 20 each 0   cloNIDine (CATAPRES - DOSED IN MG/24 HR) 0.3 mg/24hr patch Place 1 patch (0.3 mg total) onto the skin once a week. (Patient taking differently: Place 0.3 mg onto the skin every Saturday.) 4 patch 12   fenofibrate  (TRICOR) 145 MG tablet TAKE 1 TABLET BY MOUTH ONCE DAILY (Patient not taking: No sig reported) 90 tablet 0   mirabegron ER (MYRBETRIQ) 50 MG TB24 tablet Take 50 mg by mouth daily. (Patient not taking: No sig reported)     neomycin-bacitracin-polymyxin (NEOSPORIN) ointment Apply 1 application topically as needed for wound care. (Patient not taking: No sig reported)     nitroGLYCERIN (NITROSTAT) 0.4 MG SL tablet Place 0.4 mg under the tongue every 5 (five) minutes as needed for chest pain.     [DISCONTINUED] rosuvastatin (CRESTOR) 40 MG tablet Take 1 tablet (40 mg total) by mouth daily. 30 tablet 6     ROS:                                                                                                                                       As per HPI. Comprehensive ROS otherwise negative, although patient is poorly oriented to time, making history taking less reliable.    Blood pressure (!) 153/80, pulse 71, temperature 99.2 F (37.3 C), temperature source Oral, resp. rate 16, height 5\' 8"  (1.727 m), weight 84.4 kg, SpO2 97 %.   General Examination:                                                                                                       Physical Exam  HEENT-  [place]/AT    Lungs-Respirations unlabored Extremities- Warm and well perfused.   Neurological Examination Mental Status: Awake and alert. Euthymic affect. Pleasant and cooperative. Speech is fluent with intact comprehension for basic commands but has difficulty with direction-based commands, appearing confused and needing redirection several times. Able to spell WORLD forwards and backwards, but the latter was with some hesitation/difficulty. Oriented to the city, state and month, but not the day of the week or the year. No alterations in his level of consciousness were noted during  the exam.  Cranial Nerves: II: Temporal visual fields intact with no extinction to DSS. PERRL  III,IV, VI: EOMI. No nystagmus. No ptosis.  V,VII:  Temp sensation equal bilaterally. Smile symmetric VIII: Hearing intact to voice IX,X: No hypophonia XI: Head is midline XII: midline tongue extension Motor: Right : Upper extremity   5/5    Left:     Upper extremity   5/5  Lower extremity   5/5     Lower extremity   5/5 No pronator drift.  Sensory: Decreased temp sensation to LUE. Normal temp sensation to BLE proximally. FT intact x 4 with no extinction to DSS.  Deep Tendon Reflexes: Asymmetry noted: 2+ left brachioradialis and biceps, 1+ right brachioradialis and biceps. 2+ bilateral patellae. Right toe downgoing, left toe upgoing.  Cerebellar: No ataxia with FNF bilaterally, but became confused with the commands, requiring frequent repetition of commands as well as redirection Gait: Deferred   Lab Results: Basic Metabolic Panel: Recent Labs  Lab 10/03/20 1000  NA 135  K 4.0  CL 100  CO2 24  GLUCOSE 171*  BUN 14  CREATININE 0.76  CALCIUM 9.6    CBC: Recent Labs  Lab 10/03/20 1000  WBC 6.5  NEUTROABS 4.7  HGB 11.3*  HCT 36.9*  MCV 72.4*  PLT 301    Cardiac Enzymes: No results for input(s): CKTOTAL, CKMB, CKMBINDEX, TROPONINI in the last 168 hours.  Lipid Panel: No results for input(s): CHOL, TRIG, HDL, CHOLHDL, VLDL, LDLCALC in the last 168 hours.  Imaging: CT HEAD WO CONTRAST (5MM)  Result Date: 10/03/2020 CLINICAL DATA:  Mental status change, unknown cause EXAM: CT HEAD WITHOUT CONTRAST TECHNIQUE: Contiguous axial images were obtained from the base of the skull through the vertex without intravenous contrast. COMPARISON:  MRI 08/15/2020.  CT head 05/26/2017. FINDINGS: Brain: No evidence of acute large vascular territory infarction, hemorrhage, hydrocephalus, extra-axial collection or mass lesion/mass effect. Remote infarcts in the left basal ganglia and bilateral thalami. Additional advanced patchy and confluent white matter hypodensity, nonspecific but compatible with chronic microvascular ischemic disease.  Partially empty sella. Vascular: No hyperdense vessel identified. Calcific intracranial atherosclerosis. Skull: No acute fracture. Sinuses/Orbits: Mild paranasal sinus mucosal thickening. Unremarkable visualized orbits. Other: No mastoid effusions. IMPRESSION: 1. No evidence of acute intracranial abnormality. 2. Similar advanced chronic microvascular ischemic disease and remote infarcts, detailed above. Electronically Signed   By: Margaretha Sheffield M.D.   On: 10/03/2020 11:18   MR ANGIO HEAD WO CONTRAST  Result Date: 10/03/2020 CLINICAL DATA:  TIA, slurred speech, expressive aphasia, confusion EXAM: MRI HEAD WITHOUT CONTRAST MRA HEAD WITHOUT CONTRAST TECHNIQUE: Multiplanar, multi-echo pulse sequences of the brain and surrounding structures were acquired without intravenous contrast. Angiographic images of the Circle of Willis were acquired using MRA technique without intravenous contrast. COMPARISON:  Same-day noncontrast CT head, brain MRI 08/15/2020 FINDINGS: MRI HEAD FINDINGS Brain: There is no evidence of acute intracranial hemorrhage, extra-axial fluid collection, or acute infarct. There are remote lacunar infarcts in the bilateral basal ganglia, left more than right. There is moderate parenchymal volume loss with commensurate enlargement of the ventricular system. There is confluent FLAIR signal abnormality throughout the supratentorial white matter likely reflecting sequela of advanced chronic white matter microangiopathy. There is no mass lesion.  There is no midline shift. Vascular: See below Skull and upper cervical spine: Normal marrow signal. Sinuses/Orbits: There is mild mucosal thickening in the maxillary sinuses. The globes and orbits are unremarkable. Other: None. MRA HEAD FINDINGS Anterior circulation: The bilateral intracranial  ICAs are patent. The bilateral MCAs and ACAs are patent. There is no aneurysm. Posterior circulation: The V4 segments of the vertebral arteries are patent, with the left  being dominant, unchanged. The basilar artery is patent. There is focal diminished enhancement of the proximal right P2 segment without evidence of complete occlusion (5-117, coronal MRA reformat key image). This is similar in appearance to the prior study from 08/15/2020. The bilateral PCAs are otherwise patent. There is no aneurysm. Anatomic variants: None. IMPRESSION: 1. No acute intracranial hemorrhage or infarct. 2. Stable parenchymal volume loss and advanced chronic white matter microangiopathy. 3. Unchanged focal stenosis of the right P2 segment. Otherwise, patent intracranial vasculature. Electronically Signed   By: Valetta Mole M.D.   On: 10/03/2020 14:17   MR BRAIN WO CONTRAST  Result Date: 10/03/2020 CLINICAL DATA:  TIA, slurred speech, expressive aphasia, confusion EXAM: MRI HEAD WITHOUT CONTRAST MRA HEAD WITHOUT CONTRAST TECHNIQUE: Multiplanar, multi-echo pulse sequences of the brain and surrounding structures were acquired without intravenous contrast. Angiographic images of the Circle of Willis were acquired using MRA technique without intravenous contrast. COMPARISON:  Same-day noncontrast CT head, brain MRI 08/15/2020 FINDINGS: MRI HEAD FINDINGS Brain: There is no evidence of acute intracranial hemorrhage, extra-axial fluid collection, or acute infarct. There are remote lacunar infarcts in the bilateral basal ganglia, left more than right. There is moderate parenchymal volume loss with commensurate enlargement of the ventricular system. There is confluent FLAIR signal abnormality throughout the supratentorial white matter likely reflecting sequela of advanced chronic white matter microangiopathy. There is no mass lesion.  There is no midline shift. Vascular: See below Skull and upper cervical spine: Normal marrow signal. Sinuses/Orbits: There is mild mucosal thickening in the maxillary sinuses. The globes and orbits are unremarkable. Other: None. MRA HEAD FINDINGS Anterior circulation: The  bilateral intracranial ICAs are patent. The bilateral MCAs and ACAs are patent. There is no aneurysm. Posterior circulation: The V4 segments of the vertebral arteries are patent, with the left being dominant, unchanged. The basilar artery is patent. There is focal diminished enhancement of the proximal right P2 segment without evidence of complete occlusion (5-117, coronal MRA reformat key image). This is similar in appearance to the prior study from 08/15/2020. The bilateral PCAs are otherwise patent. There is no aneurysm. Anatomic variants: None. IMPRESSION: 1. No acute intracranial hemorrhage or infarct. 2. Stable parenchymal volume loss and advanced chronic white matter microangiopathy. 3. Unchanged focal stenosis of the right P2 segment. Otherwise, patent intracranial vasculature. Electronically Signed   By: Valetta Mole M.D.   On: 10/03/2020 14:17   DG Chest Port 1 View  Result Date: 10/03/2020 CLINICAL DATA:  Questionable sepsis, sudden onset slurred speech and confusion, history of smoking EXAM: PORTABLE CHEST 1 VIEW COMPARISON:  04/15/2020 FINDINGS: The heart size and mediastinal contours are within normal limits. Both lungs are clear. There may be minimal residual scarring of the posterior right upper lobe at the site of previously seen airspace disease. The visualized skeletal structures are unremarkable. IMPRESSION: No acute abnormality of the lungs in AP portable projection. Electronically Signed   By: Eddie Candle M.D.   On: 10/03/2020 10:52     Assessment: 85 year old male presenting to the ED with altered mental status, slurred speech and left facial droop.  1. Exam reveals asymmetric reflexes and some evidence for cognitive impairment. Does not appear delirious or encephalopathic. May have an underlying dementia.  2. DDx for presentation includes TIA and transient confusional state with incidental left facial  droop which can often be a false positive in the elderly due to increased laxity  of facial subcutaneous tissues. Of note, the left MCA stenosis seen on MRA does not correspond to the side of the facial droop but is on the expected side given his speech deficit, if it was in fact a dysphasia rather than speech deficit secondary to confusion.  3. MRI brain: No acute intracranial hemorrhage or infarct. Stable parenchymal volume loss and advanced chronic white matter microangiopathy. 4. MRA head: Unchanged focal stenosis of the right P2 segment. Mild to moderate focal stenosis midway along the left M1 segment is also noted.  5. EKG: Sinus rhythm with 1st degree A-V block, Left axis deviation, Right bundle branch block 6. Stroke risk factors: HTN, HLD, DM, multiple prior strokes and TIAs, cancer history (melanoma) and CAD  Recommendations: 1. Continue home ASA, Plavix and pravastatin.  2. Carotid ultrasound 3. TTE 4. Cardiac telemetry 5. Modified permissive HTN protocol given advanced age. Treat SBP if > 180. After 24 hours, normalize BP to 094-709 systolic by 62% per day.  6. PT/OT/Speech 7. Will need outpatient Neurology follow up for dementia evaluation.  8. IVF 9. Glycemic control 10. Tylenol PRN fever   Electronically signed: Dr. Kerney Elbe 10/03/2020, 2:46 PM

## 2020-10-03 NOTE — ED Notes (Signed)
Pt in ED room. EKG being performed. Son at bedside. Son states pt has hx 3 strokes and hx 7 TIAs. Pt oriented to place, situation, self but disoriented to time (year).

## 2020-10-03 NOTE — ED Notes (Signed)
Daughter in law stating pt's wife currently admitted for sepsis and meningitis

## 2020-10-03 NOTE — ED Triage Notes (Addendum)
Pt to ED from [facility] via [facility]. Daughter in law was visiting pt and stated at about 0830 pt had sudden onset of slurred speech, expressive aphasia, new onset confusion. CBG 222. BP 200/100. HR 110.   Right sided facial droop noted and slurred speech. Pt alert to self only. Pt last stroke August. Pt is currently on blood thinners.

## 2020-10-03 NOTE — ED Provider Notes (Signed)
[facility] Emergency Department Provider Note  Time seen: 10:09 AM  I have reviewed the triage vital signs and the nursing notes.   HISTORY  Chief Complaint Altered mental status  HPI Steve Ramirez is a 85 y.o. male with a past medical history of CAD, hypertension, CVA, diabetes, presents to the emergency department for weakness and confusion.  According to the daughter-in-law patient lives at [facility] with his wife.  The wife has been admitted to the hospital for the past 1 week for a kidney infection that progressed to meningitis.  Patient has been visiting on a daily basis and not sleeping very much.  Family noted approximately 3 days ago that the patient began stumbling when walking and 830 this morning on checking on the patient and noted him to be confused and appearing only altered.  They have not noted any fever cough congestion vomiting or diarrhea.  They thought the patient was just overly exhausted due to visiting the hospital on a daily basis and not sleeping at very well because his wife was admitted to the hospital however today the patient was acutely confused.  Here in the emergency department patient remains confused, states the year is 19 and 8.  However he is able to tell me where we are at.  Family states normally the patient would be able to tell the year, etc. as he is very sharp.  States he has had 7 TIAs in the past.   Past Medical History:  Diagnosis Date   Coronary atherosclerosis of native coronary artery    Essential hypertension, benign    GIB (gastrointestinal bleeding)    Colonic diverticular bleed   History of melanoma    History of TIAs    Stroke [facility])    Type 2 diabetes mellitus ([place])     Patient Active Problem List   Diagnosis Date Noted   Primary osteoarthritis of left hip 09/14/2018   Primary localized osteoarthritis of hip 08/02/2018   Primary osteoarthritis of right hip 07/07/2018   Near  syncope 12/03/2016   CVA (cerebral vascular accident) ([place]) 12/03/2016   Right hemiparesis ([place]) 12/02/2016   Acute ischemic stroke ([place]) 12/02/2016   Essential hypertension 12/02/2016   Dysphagia 12/02/2016   Acute CVA (cerebrovascular accident) ([place]) 09/13/2015   TIA (transient ischemic attack) 11/29/2014   Shoulder stiffness 09/05/2012   Right rotator cuff tear 08/16/2012   Shoulder subluxation, right 08/09/2012   Elevated transaminase level 11/05/2011   Coronary atherosclerosis of native coronary artery 11/26/2010   Essential hypertension, benign 11/23/2010   Type 2 diabetes mellitus ([place]) 11/23/2010   Hyperlipidemia 11/23/2010   History of TIAs 11/23/2010    Past Surgical History:  Procedure Laterality Date   COLONOSCOPY  08/13/2010   Procedure: COLONOSCOPY;  Surgeon: Rogene Houston, MD;  Location: [facility] ENDO [facility];  Service: Endoscopy;  Laterality: N/A;   JOINT REPLACEMENT     KNEE SURGERY     arthroscopic   LEFT HEART CATHETERIZATION WITH CORONARY ANGIOGRAM N/A 11/24/2010   Procedure: LEFT HEART CATHETERIZATION WITH CORONARY ANGIOGRAM;  Surgeon: Josue Hector, MD;  Location: [facility] CATH LAB;  Service: Cardiovascular;  Laterality: N/A;   neck fusion     years ago   TOTAL HIP ARTHROPLASTY Right 08/02/2018   Procedure: TOTAL HIP ARTHROPLASTY ANTERIOR APPROACH;  Surgeon: Renette Butters, MD;  Location: [facility] ORS;  Service: Orthopedics;  Laterality: Right;   TOTAL HIP ARTHROPLASTY Left 10/18/2018   Procedure: TOTAL HIP ARTHROPLASTY ANTERIOR  APPROACH;  Surgeon: Renette Butters, MD;  Location: [facility] ORS;  Service: Orthopedics;  Laterality: Left;    Prior to Admission medications   Medication Sig Start Date End Date Taking? Authorizing Provider  aspirin EC 81 MG tablet Take 1 tablet (81 mg total) by mouth 2 (two) times daily. For DVT prophylaxis for 30 days after surgery. 10/18/18   Martensen, Charna Elizabeth III, PA-C  baclofen (LIORESAL) 10 MG tablet Take 1 tablet (10 mg total) by mouth 3  (three) times daily as needed for muscle spasms. 08/02/18   Prudencio Burly III, PA-C  cetirizine (ZYRTEC) 10 MG tablet Take 10 mg by mouth daily.    [provider]  cloNIDine (CATAPRES - DOSED IN MG/24 HR) 0.3 mg/24hr patch Place 1 patch (0.3 mg total) onto the skin once a week. Patient taking differently: Place 0.3 mg onto the skin every Saturday. 05/26/17   Caren Macadam, MD  clopidogrel (PLAVIX) 75 MG tablet Take 1 tablet (75 mg total) by mouth daily. 06/10/17   Caren Macadam, MD  escitalopram (LEXAPRO) 10 MG tablet Take 1 tablet (10 mg total) by mouth daily. 06/10/17   Caren Macadam, MD  fenofibrate (TRICOR) 145 MG tablet TAKE 1 TABLET BY MOUTH ONCE DAILY Patient taking differently: Take 145 mg by mouth every evening. 09/29/17   Caren Macadam, MD  isosorbide mononitrate (IMDUR) 30 MG 24 hr tablet Take 30 mg by mouth every evening.  11/30/16   [provider]  levothyroxine (SYNTHROID, LEVOTHROID) 50 MCG tablet Take 1 tablet (50 mcg total) by mouth daily. Patient taking differently: Take 50 mcg by mouth every evening. 06/10/17   Caren Macadam, MD  lisinopril (PRINIVIL,ZESTRIL) 40 MG tablet Take 1 tablet (40 mg total) by mouth daily. 04/01/17   Caren Macadam, MD  metFORMIN (GLUCOPHAGE) 500 MG tablet Take 1 tablet (500 mg total) by mouth 2 (two) times daily. 06/10/17   Caren Macadam, MD  metoprolol succinate (TOPROL-XL) 25 MG 24 hr tablet Take 25 mg by mouth every evening.     [provider]  mirabegron ER (MYRBETRIQ) 50 MG TB24 tablet Take 50 mg by mouth daily.    [provider]  Multiple Vitamin (MULTIVITAMIN) tablet Take 1 tablet by mouth daily.      [provider]  neomycin-bacitracin-polymyxin (NEOSPORIN) ointment Apply 1 application topically as needed for wound care.    [provider]  nitroGLYCERIN (NITROSTAT) 0.4 MG SL tablet Place 0.4 mg under the tongue every 5 (five) minutes as needed for chest pain.    [provider]  Omega-3 Fatty Acids (FISH OIL) 1200 MG CAPS Take 1,200 mg by mouth daily.    [provider]  pravastatin (PRAVACHOL) 10 MG tablet Take 10 mg by mouth every evening.  09/04/16   [provider]  rOPINIRole (REQUIP) 0.5 MG tablet Take 2 tablets (1 mg total) by mouth at bedtime. 06/10/17   Caren Macadam, MD  spironolactone (ALDACTONE) 25 MG tablet Take 1 tablet (25 mg total) by mouth daily. Patient taking differently: Take 25 mg by mouth every evening. 06/10/17   Caren Macadam, MD  rosuvastatin (CRESTOR) 40 MG tablet Take 1 tablet (40 mg total) by mouth daily. 11/26/10 04/08/11  Theora Gianotti, NP    No Known Allergies  Family History  Problem Relation Age of Onset   Coronary artery disease Father    Asthma Other    Emphysema Mother    Coronary artery disease Paternal Uncle    Brain cancer  Brother     Social History Social History   Tobacco Use   Smoking status: Former    Types: Cigarettes    Quit date: 01/13/1980    Years since quitting: 40.7   Smokeless tobacco: Never  Vaping Use   Vaping Use: Never used  Substance Use Topics   Alcohol use: Yes    Comment: occasionally   Drug use: No    Review of Systems largely per family given altered mental status Constitutional: Negative for fever. Cardiovascular: Negative for chest pain. Respiratory: Negative for shortness of breath. Gastrointestinal: Negative for abdominal pain, vomiting and diarrhea. Musculoskeletal: Negative for musculoskeletal complaints Neurological: Negative for headache.  Have noted some issues with the patient walking seems off balance time several days. All other ROS negative  ____________________________________________   PHYSICAL EXAM:  VITAL SIGNS: ED Triage Vitals  Enc Vitals Group     BP 10/03/20 0958 (!) 182/78     Pulse Rate 10/03/20 0958 81     Resp 10/03/20 0958 18     Temp 10/03/20 0958 99.2 F (37.3 C)     Temp Source 10/03/20 0958 Oral      SpO2 10/03/20 0958 96 %     Weight 10/03/20 0959 186 lb (84.4 kg)     Height 10/03/20 0959 5\' 8"  (1.727 m)     Head Circumference --      Peak Flow --      Pain Score --      Pain Loc --      Pain Edu? --      Excl. in [place]? --    Constitutional: Awake alert, no distress.  Oriented to person and place only but not time. Eyes: Normal exam ENT      Head: Normocephalic and atraumatic.      Mouth/Throat: Mucous membranes are moist. Cardiovascular: Normal rate, regular rhythm Respiratory: Normal respiratory effort without tachypnea nor retractions. Breath sounds are clear  Gastrointestinal: Soft and nontender. No distention.  Musculoskeletal: Nontender with normal range of motion in all extremities. Neurologic:  Normal speech and language. No gross focal neurologic deficits  Skin:  Skin is warm, dry and intact.  Psychiatric: Mood and affect are normal.   ____________________________________________    EKG  EKG viewed and interpreted by myself shows a normal sinus rhythm at 76 bpm with a widened QRS, left axis deviation, PR prolongation otherwise normal intervals with nonspecific ST changes.  ____________________________________________    RADIOLOGY  CT scan head shows no acute abnormality Chest x-ray negative. ____________________________________________   INITIAL IMPRESSION / ASSESSMENT AND PLAN / ED COURSE  Pertinent labs & imaging results that were available during my care of the patient were reviewed by me and considered in my medical decision making (see chart for details).   Patient presents emergency department for altered mental status since this morning at 830 however last known normal was yesterday and the family states he has been off balance for the past 2 to 3 days.  Does not appear to be within the tPA window given last known normal of last night.  No focal weakness or numbness on my exam.  Patient is disoriented to time which is atypical per family.  99.2  temperature in the emergency department.  We will check labs, urinalysis, chest x-ray, COVID swab.  We will obtain a CT scan of the head and continue to closely monitor.  Patient agreeable to plan of care.  CT scan of the head is negative for acute abnormality.  Lab work largely [place].  Concern for CVA versus TIA.  We will admit to the hospital service for further work-up and treatment.  NIH Stroke Scale   Interval: Baseline Time: 3:16 PM Person Administering Scale: Harvest Dark  Administer stroke scale items in the order listed. Record performance in each category after each subscale exam. Do not go back and change scores. Follow directions provided for each exam technique. Scores should reflect what the patient does, not what the clinician thinks the patient can do. The clinician should record answers while administering the exam and work quickly. Except where indicated, the patient should not be coached (i.e., repeated requests to patient to make a special effort).   1a  Level of consciousness: 0=alert; keenly responsive  1b. LOC questions:  1=Performs one task correctly  1c. LOC commands: 0=Performs both tasks correctly  2.  Best Gaze: 0=normal  3.  Visual: 0=No visual loss  4. Facial Palsy: 0=Normal symmetric movement  5a.  Motor left arm: 0=No drift, limb holds 90 (or 45) degrees for full 10 seconds  5b.  Motor right arm: 0=No drift, limb holds 90 (or 45) degrees for full 10 seconds  6a. motor left leg: 0=No drift, limb holds 90 (or 45) degrees for full 10 seconds  6b  Motor right leg:  0=No drift, limb holds 90 (or 45) degrees for full 10 seconds  7. Limb Ataxia: 0=Absent  8.  Sensory: 0=Normal; no sensory loss  9. Best Language:  1=Mild to moderate aphasia; some obvious loss of fluency or facility of comprehension without significant limitation on ideas expressed or form of expression.  10. Dysarthria: 0=Normal  11. Extinction and Inattention: 0=No abnormality  12.  Distal motor function: 0=Normal   Total:   2    Steve Ramirez was evaluated in Emergency Department on 10/03/2020 for the symptoms described in the history of present illness. He was evaluated in the context of the global COVID-19 pandemic, which necessitated consideration that the patient might be at risk for infection with the SARS-CoV-2 virus that causes COVID-19. Institutional protocols and algorithms that pertain to the evaluation of patients at risk for COVID-19 are in a state of rapid change based on information released by regulatory bodies including the CDC and federal and state organizations. These policies and algorithms were followed during the patient's care in the ED.  ____________________________________________   FINAL CLINICAL IMPRESSION(S) / ED DIAGNOSES  Altered mental status   Harvest Dark, MD 10/03/20 1517

## 2020-10-03 NOTE — H&P (Signed)
History and Physical    ABRHAM Ramirez [id number]:[id number] DOB: November 21, 1935 DOA: 10/03/2020  Referring MD/NP/PA:   PCP: Sharilyn Sites, MD   Patient coming from:  The patient is coming from home.    Chief Complaint: AMS, slurred speech  HPI: Steve Ramirez is a 85 y.o. male with medical history significant of hypertension, hyperlipidemia, diabetes mellitus, multiple stroke and TIA (3 strokes and 7 TIA per his son), hypothyroidism, depression, diverticular GI bleeding, CAD, syncope, who presents with altered mental status and slurred speech.  Per patient's son at the bedside, patient was noted to be confused this morning, with slurred speech.  Patient does not seem to have new unilateral numbness or tinglings in extremities.  Patient has mild left facial droop, which seems to be normal to patient per his son.  At his normal baseline, patient is sharp and oriented x3.  Currently patient is confused, knows his own name, knows that he is in hospital, but cannot name the hospital, not orientated to the year 2022.  Denies chest pain, cough, shortness of breath.  No nausea vomiting, diarrhea or abdominal pain.  No symptoms of UTI.  No fever or chills.  Of note,  his son reports that pt's wife has been admitted to the hospital for the past 1 week due to kidney infection that progressed to meningitis.  Patient has been visiting on a daily basis.   ED Course: pt was found to have WBC 6.5, INR 1.1, PTT 29, negative urinalysis, electrolytes renal function okay, temperature 99.2, blood pressure 199/84, heart rate 81, RR 20, oxygen saturation 96% on room air.  CT of head is negative for acute intracranial abnormalities.  Chest x-ray negative.  Patient is placed on MedSurg bed for observation.  MRI/MRA: 1. No acute intracranial hemorrhage or infarct. 2. Stable parenchymal volume loss and advanced chronic white matter microangiopathy. 3. Unchanged focal stenosis of the right P2 segment.  Otherwise, patent intracranial vasculature.   Review of Systems: Could not reviewed accurately due to altered mental status.   Allergy: No Known Allergies  Past Medical History:  Diagnosis Date   Coronary atherosclerosis of native coronary artery    Essential hypertension, benign    GIB (gastrointestinal bleeding)    Colonic diverticular bleed   History of melanoma    History of TIAs    Stroke ([place])    Type 2 diabetes mellitus ([place])     Past Surgical History:  Procedure Laterality Date   COLONOSCOPY  08/13/2010   Procedure: COLONOSCOPY;  Surgeon: Rogene Houston, MD;  Location[facility];  Service: Endoscopy;  Laterality: N/A;   JOINT REPLACEMENT     KNEE SURGERY     arthroscopic   LEFT HEART CATHETERIZATION WITH CORONARY ANGIOGRAM N/A 11/24/2010   Procedure: LEFT HEART CATHETERIZATION WITH CORONARY ANGIOGRAM;  Surgeon: Josue Hector, MD;  Location: [facility] CATH LAB;  Service: Cardiovascular;  Laterality: N/A;   neck fusion     years ago   TOTAL HIP ARTHROPLASTY Right 08/02/2018   Procedure: TOTAL HIP ARTHROPLASTY ANTERIOR APPROACH;  Surgeon: Renette Butters, MD;  Location: [facility] ORS;  Service: Orthopedics;  Laterality: Right;   TOTAL HIP ARTHROPLASTY Left 10/18/2018   Procedure: TOTAL HIP ARTHROPLASTY ANTERIOR APPROACH;  Surgeon: Renette Butters, MD;  Location: [facility] ORS;  Service: Orthopedics;  Laterality: Left;    Social History:  reports that he quit smoking about 40 years ago. His smoking use included cigarettes. He has never used smokeless tobacco. He reports  current alcohol use. He reports that he does not use drugs.  Family History:  Family History  Problem Relation Age of Onset   Coronary artery disease Father    Asthma Other    Emphysema Mother    Coronary artery disease Paternal Uncle    Brain cancer Brother      Prior to Admission medications   Medication Sig Start Date End Date Taking? Authorizing Provider  aspirin EC 81 MG tablet Take 1 tablet (81 mg total)  by mouth 2 (two) times daily. For DVT prophylaxis for 30 days after surgery. 10/18/18   Martensen, Charna Elizabeth III, PA-C  baclofen (LIORESAL) 10 MG tablet Take 1 tablet (10 mg total) by mouth 3 (three) times daily as needed for muscle spasms. 08/02/18   Prudencio Burly III, PA-C  cetirizine (ZYRTEC) 10 MG tablet Take 10 mg by mouth daily.    [provider]  cloNIDine (CATAPRES - DOSED IN MG/24 HR) 0.3 mg/24hr patch Place 1 patch (0.3 mg total) onto the skin once a week. Patient taking differently: Place 0.3 mg onto the skin every Saturday. 05/26/17   Caren Macadam, MD  clopidogrel (PLAVIX) 75 MG tablet Take 1 tablet (75 mg total) by mouth daily. 06/10/17   Caren Macadam, MD  escitalopram (LEXAPRO) 10 MG tablet Take 1 tablet (10 mg total) by mouth daily. 06/10/17   Caren Macadam, MD  fenofibrate (TRICOR) 145 MG tablet TAKE 1 TABLET BY MOUTH ONCE DAILY Patient taking differently: Take 145 mg by mouth every evening. 09/29/17   Caren Macadam, MD  isosorbide mononitrate (IMDUR) 30 MG 24 hr tablet Take 30 mg by mouth every evening.  11/30/16   [provider]  levothyroxine (SYNTHROID, LEVOTHROID) 50 MCG tablet Take 1 tablet (50 mcg total) by mouth daily. Patient taking differently: Take 50 mcg by mouth every evening. 06/10/17   Caren Macadam, MD  lisinopril (PRINIVIL,ZESTRIL) 40 MG tablet Take 1 tablet (40 mg total) by mouth daily. 04/01/17   Caren Macadam, MD  metFORMIN (GLUCOPHAGE) 500 MG tablet Take 1 tablet (500 mg total) by mouth 2 (two) times daily. 06/10/17   Caren Macadam, MD  metoprolol succinate (TOPROL-XL) 25 MG 24 hr tablet Take 25 mg by mouth every evening.     [provider]  mirabegron ER (MYRBETRIQ) 50 MG TB24 tablet Take 50 mg by mouth daily.    [provider]  Multiple Vitamin (MULTIVITAMIN) tablet Take 1 tablet by mouth daily.      [provider]  neomycin-bacitracin-polymyxin (NEOSPORIN) ointment Apply 1 application topically as  needed for wound care.    [provider]  nitroGLYCERIN (NITROSTAT) 0.4 MG SL tablet Place 0.4 mg under the tongue every 5 (five) minutes as needed for chest pain.    [provider]  Omega-3 Fatty Acids (FISH OIL) 1200 MG CAPS Take 1,200 mg by mouth daily.    [provider]  pravastatin (PRAVACHOL) 10 MG tablet Take 10 mg by mouth every evening.  09/04/16   [provider]  rOPINIRole (REQUIP) 0.5 MG tablet Take 2 tablets (1 mg total) by mouth at bedtime. 06/10/17   Caren Macadam, MD  spironolactone (ALDACTONE) 25 MG tablet Take 1 tablet (25 mg total) by mouth daily. Patient taking differently: Take 25 mg by mouth every evening. 06/10/17   Caren Macadam, MD  rosuvastatin (CRESTOR) 40 MG tablet Take 1 tablet (40 mg total) by mouth daily. 11/26/10 04/08/11  Theora Gianotti, NP    Physical Exam: Vitals:  10/03/20 1128 10/03/20 1130 10/03/20 1442 10/03/20 1626  BP: (!) 199/84 (!) 198/86 (!) 153/80 118/84  Pulse: 77 78 71 (!) 55  Resp: 20 16 16 18   Temp:      TempSrc:      SpO2: 99% 96% 97% 93%  Weight:      Height:       General: Not in acute distress HEENT:       Eyes: PERRL, EOMI, no scleral icterus.       ENT: No discharge from the ears and nose, no pharynx injection, no tonsillar enlargement.        Neck: No JVD, no bruit, no mass felt. Heme: No neck lymph node enlargement. Cardiac: S1/S2, RRR, No murmurs, No gallops or rubs. Respiratory: No rales, wheezing, rhonchi or rubs. GI: Soft, nondistended, nontender, no rebound pain, no organomegaly, BS present. GU: No hematuria Ext: No pitting leg edema bilaterally. 1+DP/PT pulse bilaterally. Musculoskeletal: No joint deformities, No joint redness or warmth, no limitation of ROM in spin. Skin: No rashes.  Neuro: Confused, knows his own name, knows that he is in hospital, confused about year 2022.  Cranial nerves II-XII grossly intact except for left facial droop, moves all  extremities Psych: Patient is not psychotic, no suicidal or hemocidal ideation.  Labs on Admission: I have personally reviewed following labs and imaging studies  CBC: Recent Labs  Lab 10/03/20 1000  WBC 6.5  NEUTROABS 4.7  HGB 11.3*  HCT 36.9*  MCV 72.4*  PLT 563   Basic Metabolic Panel: Recent Labs  Lab 10/03/20 1000  NA 135  K 4.0  CL 100  CO2 24  GLUCOSE 171*  BUN 14  CREATININE 0.76  CALCIUM 9.6   GFR: Estimated Creatinine Clearance: 75.3 mL/min (by C-G formula based on SCr of 0.76 mg/dL). Liver Function Tests: Recent Labs  Lab 10/03/20 1000  AST 16  ALT 13  ALKPHOS 59  BILITOT 0.8  PROT 7.4  ALBUMIN 4.1   No results for input(s): LIPASE, AMYLASE in the last 168 hours. No results for input(s): AMMONIA in the last 168 hours. Coagulation Profile: Recent Labs  Lab 10/03/20 1000  INR 1.1   Cardiac Enzymes: No results for input(s): CKTOTAL, CKMB, CKMBINDEX, TROPONINI in the last 168 hours. BNP (last 3 results) No results for input(s): PROBNP in the last 8760 hours. HbA1C: No results for input(s): HGBA1C in the last 72 hours. CBG: No results for input(s): GLUCAP in the last 168 hours. Lipid Profile: No results for input(s): CHOL, HDL, LDLCALC, TRIG, CHOLHDL, LDLDIRECT in the last 72 hours. Thyroid Function Tests: No results for input(s): TSH, T4TOTAL, FREET4, T3FREE, THYROIDAB in the last 72 hours. Anemia Panel: No results for input(s): VITAMINB12, FOLATE, FERRITIN, TIBC, IRON, RETICCTPCT in the last 72 hours. Urine analysis:    Component Value Date/Time   COLORURINE YELLOW (A) 10/03/2020 1130   APPEARANCEUR HAZY (A) 10/03/2020 1130   LABSPEC 1.014 10/03/2020 1130   PHURINE 7.0 10/03/2020 1130   GLUCOSEU NEGATIVE 10/03/2020 1130   HGBUR NEGATIVE 10/03/2020 1130   BILIRUBINUR NEGATIVE 10/03/2020 1130   KETONESUR NEGATIVE 10/03/2020 1130   PROTEINUR NEGATIVE 10/03/2020 1130   UROBILINOGEN 0.2 04/15/2010 1425   NITRITE NEGATIVE 10/03/2020 1130    LEUKOCYTESUR NEGATIVE 10/03/2020 1130   Sepsis Labs: @LABRCNTIP (procalcitonin:4,lacticidven:4) ) Recent Results (from the past 240 hour(s))  Resp Panel by RT-PCR (Flu A&B, Covid) Nasopharyngeal Swab     Status: None   Collection Time: 10/03/20  2:30 PM   Specimen: Nasopharyngeal  Swab; Nasopharyngeal(NP) swabs in vial transport medium  Result Value Ref Range Status   SARS Coronavirus 2 by RT PCR NEGATIVE NEGATIVE Final    Comment: (NOTE) SARS-CoV-2 target nucleic acids are NOT DETECTED.  The SARS-CoV-2 RNA is generally detectable in upper respiratory specimens during the acute phase of infection. The lowest concentration of SARS-CoV-2 viral copies this assay can detect is 138 copies/mL. A negative result does not preclude SARS-Cov-2 infection and should not be used as the sole basis for treatment or other patient management decisions. A negative result may occur with  improper specimen collection/handling, submission of specimen other than nasopharyngeal swab, presence of viral mutation(s) within the areas targeted by this assay, and inadequate number of viral copies(<138 copies/mL). A negative result must be combined with clinical observations, patient history, and epidemiological information. The expected result is Negative.  Fact Sheet for Patients:  EntrepreneurPulse.com.au  Fact Sheet for Healthcare Providers:  IncredibleEmployment.be  This test is no t yet approved or cleared by the [place] FDA and  has been authorized for detection and/or diagnosis of SARS-CoV-2 by FDA under an Emergency Use Authorization (EUA). This EUA will remain  in effect (meaning this test can be used) for the duration of the COVID-19 declaration under Section 564(b)(1) of the Act, 21 U.S.C.section 360bbb-3(b)(1), unless the authorization is terminated  or revoked sooner.       Influenza A by PCR NEGATIVE NEGATIVE Final   Influenza B by PCR NEGATIVE  NEGATIVE Final    Comment: (NOTE) The Xpert Xpress SARS-CoV-2/FLU/RSV plus assay is intended as an aid in the diagnosis of influenza from Nasopharyngeal swab specimens and should not be used as a sole basis for treatment. Nasal washings and aspirates are unacceptable for Xpert Xpress SARS-CoV-2/FLU/RSV testing.  Fact Sheet for Patients: EntrepreneurPulse.com.au  Fact Sheet for Healthcare Providers: IncredibleEmployment.be  This test is not yet approved or cleared by the [place] FDA and has been authorized for detection and/or diagnosis of SARS-CoV-2 by FDA under an Emergency Use Authorization (EUA). This EUA will remain in effect (meaning this test can be used) for the duration of the COVID-19 declaration under Section 564(b)(1) of the Act, 21 U.S.C. section 360bbb-3(b)(1), unless the authorization is terminated or revoked.  Performed at [facility], [address] [place], [place] [address]      Radiological Exams on Admission: CT HEAD WO CONTRAST (5MM)  Result Date: 10/03/2020 CLINICAL DATA:  Mental status change, unknown cause EXAM: CT HEAD WITHOUT CONTRAST TECHNIQUE: Contiguous axial images were obtained from the base of the skull through the vertex without intravenous contrast. COMPARISON:  MRI 08/15/2020.  CT head 05/26/2017. FINDINGS: Brain: No evidence of acute large vascular territory infarction, hemorrhage, hydrocephalus, extra-axial collection or mass lesion/mass effect. Remote infarcts in the left basal ganglia and bilateral thalami. Additional advanced patchy and confluent white matter hypodensity, nonspecific but compatible with chronic microvascular ischemic disease. Partially empty sella. Vascular: No hyperdense vessel identified. Calcific intracranial atherosclerosis. Skull: No acute fracture. Sinuses/Orbits: Mild paranasal sinus mucosal thickening. Unremarkable visualized orbits. Other: No mastoid effusions.  IMPRESSION: 1. No evidence of acute intracranial abnormality. 2. Similar advanced chronic microvascular ischemic disease and remote infarcts, detailed above. Electronically Signed   By: Margaretha Sheffield M.D.   On: 10/03/2020 11:18   MR ANGIO HEAD WO CONTRAST  Result Date: 10/03/2020 CLINICAL DATA:  TIA, slurred speech, expressive aphasia, confusion EXAM: MRI HEAD WITHOUT CONTRAST MRA HEAD WITHOUT CONTRAST TECHNIQUE: Multiplanar, multi-echo pulse sequences of the brain and surrounding structures  were acquired without intravenous contrast. Angiographic images of the Circle of Willis were acquired using MRA technique without intravenous contrast. COMPARISON:  Same-day noncontrast CT head, brain MRI 08/15/2020 FINDINGS: MRI HEAD FINDINGS Brain: There is no evidence of acute intracranial hemorrhage, extra-axial fluid collection, or acute infarct. There are remote lacunar infarcts in the bilateral basal ganglia, left more than right. There is moderate parenchymal volume loss with commensurate enlargement of the ventricular system. There is confluent FLAIR signal abnormality throughout the supratentorial white matter likely reflecting sequela of advanced chronic white matter microangiopathy. There is no mass lesion.  There is no midline shift. Vascular: See below Skull and upper cervical spine: Normal marrow signal. Sinuses/Orbits: There is mild mucosal thickening in the maxillary sinuses. The globes and orbits are unremarkable. Other: None. MRA HEAD FINDINGS Anterior circulation: The bilateral intracranial ICAs are patent. The bilateral MCAs and ACAs are patent. There is no aneurysm. Posterior circulation: The V4 segments of the vertebral arteries are patent, with the left being dominant, unchanged. The basilar artery is patent. There is focal diminished enhancement of the proximal right P2 segment without evidence of complete occlusion (5-117, coronal MRA reformat key image). This is similar in appearance to the  prior study from 08/15/2020. The bilateral PCAs are otherwise patent. There is no aneurysm. Anatomic variants: None. IMPRESSION: 1. No acute intracranial hemorrhage or infarct. 2. Stable parenchymal volume loss and advanced chronic white matter microangiopathy. 3. Unchanged focal stenosis of the right P2 segment. Otherwise, patent intracranial vasculature. Electronically Signed   By: Valetta Mole M.D.   On: 10/03/2020 14:17   MR BRAIN WO CONTRAST  Result Date: 10/03/2020 CLINICAL DATA:  TIA, slurred speech, expressive aphasia, confusion EXAM: MRI HEAD WITHOUT CONTRAST MRA HEAD WITHOUT CONTRAST TECHNIQUE: Multiplanar, multi-echo pulse sequences of the brain and surrounding structures were acquired without intravenous contrast. Angiographic images of the Circle of Willis were acquired using MRA technique without intravenous contrast. COMPARISON:  Same-day noncontrast CT head, brain MRI 08/15/2020 FINDINGS: MRI HEAD FINDINGS Brain: There is no evidence of acute intracranial hemorrhage, extra-axial fluid collection, or acute infarct. There are remote lacunar infarcts in the bilateral basal ganglia, left more than right. There is moderate parenchymal volume loss with commensurate enlargement of the ventricular system. There is confluent FLAIR signal abnormality throughout the supratentorial white matter likely reflecting sequela of advanced chronic white matter microangiopathy. There is no mass lesion.  There is no midline shift. Vascular: See below Skull and upper cervical spine: Normal marrow signal. Sinuses/Orbits: There is mild mucosal thickening in the maxillary sinuses. The globes and orbits are unremarkable. Other: None. MRA HEAD FINDINGS Anterior circulation: The bilateral intracranial ICAs are patent. The bilateral MCAs and ACAs are patent. There is no aneurysm. Posterior circulation: The V4 segments of the vertebral arteries are patent, with the left being dominant, unchanged. The basilar artery is patent.  There is focal diminished enhancement of the proximal right P2 segment without evidence of complete occlusion (5-117, coronal MRA reformat key image). This is similar in appearance to the prior study from 08/15/2020. The bilateral PCAs are otherwise patent. There is no aneurysm. Anatomic variants: None. IMPRESSION: 1. No acute intracranial hemorrhage or infarct. 2. Stable parenchymal volume loss and advanced chronic white matter microangiopathy. 3. Unchanged focal stenosis of the right P2 segment. Otherwise, patent intracranial vasculature. Electronically Signed   By: Valetta Mole M.D.   On: 10/03/2020 14:17   DG Chest Port 1 View  Result Date: 10/03/2020 CLINICAL DATA:  Questionable sepsis, sudden  onset slurred speech and confusion, history of smoking EXAM: PORTABLE CHEST 1 VIEW COMPARISON:  04/15/2020 FINDINGS: The heart size and mediastinal contours are within normal limits. Both lungs are clear. There may be minimal residual scarring of the posterior right upper lobe at the site of previously seen airspace disease. The visualized skeletal structures are unremarkable. IMPRESSION: No acute abnormality of the lungs in AP portable projection. Electronically Signed   By: Eddie Candle M.D.   On: 10/03/2020 10:52     EKG: I have personally reviewed.  Sinus rhythm, QTC 479, bifascicular block  Assessment/Plan Principal Problem:   TIA (transient ischemic attack) Active Problems:   Diabetes mellitus without complication (HCC)   Hyperlipidemia   History of TIAs   Essential hypertension   CVA (cerebral vascular accident) ([place])   Depression   Hypothyroidism   CAD (coronary artery disease)   Acute metabolic encephalopathy   Possible TIA (transient ischemic attack): Patient's symptoms are concerning for possible TIA. MRI/MRA showed no acute intracranial hemorrhage or infarct, and stable parenchymal volume loss and advanced chronic white matter microangiopathy, with uchanged focal stenosis. Dr. Cheral Marker  of neurology is consulted  -Placed on MedSurg bed for observation - Continue Plavix and add ASA   -Pravastatin - fasting lipid panel and HbA1c  - swallowing screen. If fails, will get SLP - PT/OT consult  Diabetes mellitus without complication ([place]): Recent A1c 7.0.  Patient is taking metformin -Sliding scale insulin  Hyperlipidemia -Pravastatin  History of TIAs and CVA -On Plavix, aspirin, pravastatin  Essential hypertension -IV hydralazine as needed -Continue home lisinopril, metoprolol  Depression -Continue home medications  Hypothyroidism -Synthroid  CAD (coronary artery disease): No chest pain -Continue aspirin, Plavix, pravastatin  Acute metabolic encephalopathy: Possibly due to TIA -Frequent neuro check     DVT ppx:  SQ Lovenox Code Status: Full code per his son Family Communication:  Yes, patient's son    at bed side Disposition Plan:  Anticipate discharge back to previous environment Consults called: Dr. Cheral Marker of neurology Admission status and Level of care: Med-Surg:    for obs      Date of Service 10/03/2020    [facility]   If 7PM-7AM, please contact night-coverage [email or web address] 10/03/2020, 6:39 PM

## 2020-10-04 ENCOUNTER — Observation Stay: Payer: Medicare Other

## 2020-10-04 DIAGNOSIS — G9341 Metabolic encephalopathy: Secondary | ICD-10-CM | POA: Diagnosis not present

## 2020-10-04 DIAGNOSIS — G459 Transient cerebral ischemic attack, unspecified: Secondary | ICD-10-CM | POA: Diagnosis not present

## 2020-10-04 DIAGNOSIS — E119 Type 2 diabetes mellitus without complications: Secondary | ICD-10-CM | POA: Diagnosis not present

## 2020-10-04 DIAGNOSIS — I10 Essential (primary) hypertension: Secondary | ICD-10-CM | POA: Diagnosis not present

## 2020-10-04 LAB — LIPID PANEL
Cholesterol: 117 mg/dL (ref 0–200)
HDL: 28 mg/dL — ABNORMAL LOW (ref >40)
LDL Cholesterol: 61 mg/dL (ref 0–99)
Total CHOL/HDL Ratio: 4.2 RATIO
Triglycerides: 140 mg/dL (ref <150)
VLDL: 28 mg/dL (ref 0–40)

## 2020-10-04 LAB — URINE CULTURE

## 2020-10-04 LAB — HEMOGLOBIN A1C
Hgb A1c MFr Bld: 6.6 % — ABNORMAL HIGH (ref 4.8–5.6)
Mean Plasma Glucose: 143 mg/dL

## 2020-10-04 LAB — GLUCOSE, CAPILLARY
Glucose-Capillary: 194 mg/dL — ABNORMAL HIGH (ref 70–99)
Glucose-Capillary: 131 mg/dL — ABNORMAL HIGH (ref 70–99)
Glucose-Capillary: 153 mg/dL — ABNORMAL HIGH (ref 70–99)
Glucose-Capillary: 142 mg/dL — ABNORMAL HIGH (ref 70–99)

## 2020-10-04 NOTE — Plan of Care (Signed)
Patient received from ED. A/O x 4 with intermittent forgetfulness. 4ps and plan of care discussed. No residual or deficits noted. Denies any discomfort at this time. Bed alarm on. Call light within reach. Continue poc/ hourly rounding. Problem: Education: Goal: Knowledge of General Education information will improve Description: Including pain rating scale, medication(s)/side effects and non-pharmacologic comfort measures Outcome: Progressing   Problem: Health Behavior/Discharge Planning: Goal: Ability to manage health-related needs will improve Outcome: Progressing   Problem: Clinical Measurements: Goal: Ability to maintain clinical measurements within normal limits will improve Outcome: Progressing Goal: Will remain free from infection Outcome: Progressing Goal: Diagnostic test results will improve Outcome: Progressing Goal: Respiratory complications will improve Outcome: Progressing Goal: Cardiovascular complication will be avoided Outcome: Progressing   Problem: Activity: Goal: Risk for activity intolerance will decrease Outcome: Progressing   Problem: Nutrition: Goal: Adequate nutrition will be maintained Outcome: Progressing   Problem: Coping: Goal: Level of anxiety will decrease Outcome: Progressing   Problem: Elimination: Goal: Will not experience complications related to bowel motility Outcome: Progressing Goal: Will not experience complications related to urinary retention Outcome: Progressing   Problem: Pain Managment: Goal: General experience of comfort will improve Outcome: Progressing   Problem: Safety: Goal: Ability to remain free from injury will improve Outcome: Progressing   Problem: Skin Integrity: Goal: Risk for impaired skin integrity will decrease Outcome: Progressing

## 2020-10-04 NOTE — TOC Progression Note (Signed)
Transition of Care [name]) - Progression Note    Patient Details  Name: Steve Ramirez MRN: [id number] Date of Birth: 1935-08-14  Transition of Care [facility]) CM/SW Leigh, RN Phone Number: 10/04/2020, 10:37 AM  Clinical Narrative:   Patient lives at [place] with wife.  Wife is also hospitalized at this time.  Lisa from [place] to follow up with [facility] upon therapy recommendations and disposition.  TOC to follow.         Expected Discharge Plan and Services                                                 Social Determinants of Health (SDOH) Interventions    Readmission Risk Interventions No flowsheet data found.

## 2020-10-04 NOTE — Evaluation (Signed)
Speech Language Pathology Evaluation Patient Details Name: Steve Ramirez MRN: [id number] DOB: 08-06-1935 Today's Date: 10/04/2020 Time: 7989-2119 SLP Time Calculation (min) (ACUTE ONLY): 45 min  Problem List:  Patient Active Problem List   Diagnosis Date Noted   Depression 10/03/2020   Hypothyroidism 10/03/2020   CAD (coronary artery disease) 41/74/0814   Acute metabolic encephalopathy 48/18/5631   Primary osteoarthritis of left hip 09/14/2018   Primary localized osteoarthritis of hip 08/02/2018   Primary osteoarthritis of right hip 07/07/2018   Near syncope 12/03/2016   CVA (cerebral vascular accident) ([place]) 12/03/2016   Right hemiparesis ([place]) 12/02/2016   Acute ischemic stroke ([place]) 12/02/2016   Essential hypertension 12/02/2016   Dysphagia 12/02/2016   Acute CVA (cerebrovascular accident) ([place]) 09/13/2015   TIA (transient ischemic attack) 11/29/2014   Shoulder stiffness 09/05/2012   Right rotator cuff tear 08/16/2012   Shoulder subluxation, right 08/09/2012   Elevated transaminase level 11/05/2011   Coronary atherosclerosis of native coronary artery 11/26/2010   Essential hypertension, benign 11/23/2010   Diabetes mellitus without complication ([place]) 49/70/2637   Hyperlipidemia 11/23/2010   History of TIAs 11/23/2010   Past Medical History:  Past Medical History:  Diagnosis Date   Coronary atherosclerosis of native coronary artery    Essential hypertension, benign    GIB (gastrointestinal bleeding)    Colonic diverticular bleed   History of melanoma    History of TIAs    Stroke ([place])    Type 2 diabetes mellitus ([place])    Past Surgical History:  Past Surgical History:  Procedure Laterality Date   COLONOSCOPY  08/13/2010   Procedure: COLONOSCOPY;  Surgeon: Rogene Houston, MD;  Location: [facility];  Service: Endoscopy;  Laterality: N/A;   JOINT REPLACEMENT     KNEE SURGERY     arthroscopic   LEFT HEART CATHETERIZATION WITH CORONARY ANGIOGRAM N/A  11/24/2010   Procedure: LEFT HEART CATHETERIZATION WITH CORONARY ANGIOGRAM;  Surgeon: Josue Hector, MD;  Location: [facility] CATH LAB;  Service: Cardiovascular;  Laterality: N/A;   neck fusion     years ago   TOTAL HIP ARTHROPLASTY Right 08/02/2018   Procedure: TOTAL HIP ARTHROPLASTY ANTERIOR APPROACH;  Surgeon: Renette Butters, MD;  Location: [facility] OR[facility];  Service: Orthopedics;  Laterality: Right;   TOTAL HIP ARTHROPLASTY Left 10/18/2018   Procedure: TOTAL HIP ARTHROPLASTY ANTERIOR APPROACH;  Surgeon: Renette Butters, MD;  Location: [facility] OR[facility];  Service: Orthopedics;  Laterality: Left;   HPI:  Pt is an 85 y.o. male with a PMHx of HTN, HLD, DM, multiple prior strokes and TIAs (3 strokes and 7 TIAs per his son), hypothyroidism, melanoma, depression, diverticular GI bleeding and CAD, who presented to the ED this morning with AMS, poor balance, slurred speech and left facial droop. Symptoms were noted by his daughter in law while she was visiting him at his living facility [facility]). MRI: No acute intracranial hemorrhage or infarct; old lacunar infarcts in the bilateral basal ganglia,  left more than right.  2. Stable parenchymal volume loss and Advanced chronic white matter  microangiopathy.  Pt's Wife has been admitted to the hospital for the past 1 week for a kidney infection that progressed to meningitis.  Patient has been visiting on a daily basis and not sleeping very much.   Assessment / Plan / Recommendation Clinical Impression  Patient completed informal Cognitive-linguistic evaluation today using parts of the Bedside Western Aphasia Battery (B-WAB) and various Cognitive tasks. Pt appears to present w a degree of Cognitive-linguistic decline; possible  impact of mild Cognitive decline on his language abilities. See Neurology consult/note; MRI results.   Content and Fluency of speech was grossly adequate w basic tasks though pt tended to focus on stating details of the picture and required min verbal cues  to elaborate especially when noting probelm situations; Auditory Verbal Comprehension (7/10) - errors increased as quiestions became more complex; Sequential Commands (6/10) - errors increased w multistep commands; Repetition (8/10) - errors as pt deviated from details of phrase; Naming (10/10). This presentation reveals a degree of expressive/receptive language skills deficits seen in more complex language tasks. This presentation may be impacted by pt's declined Cognitive performance during tasks: noted deficits in Fluency, Delayed Recall, Attention, and Executive Function. Pt exhibited deficits w/ Orientation -- he self-corrected often. Calculation and Naming were appropriate. No verbal Apraxia, no Dysarthria, and no Motor Speech deficits were noted. Of note, pt exhibited min slower processing during his verbal engagements but w/ Time, he engaged appropriately and answered basic/general questions. Patient's speech was c/b tangential information; sometimes excessive and unnecessary information. Pt engaged in frequent verbal output, chuckled often during engagments. Reducing distractions was beneficial for pt to maintain topic. Pt may demonstrate a degree of Cognitive-Linguistic deficits d/t suspected, Baseline Cognitive decline per Neurologist's note in chart. HOWEVER, when engaged in general conversation re: Neurology has recommended f/u at Discharge for Formal Cognitive assessment w/ Neurology consult to further assess and determine pt's Baseline Cognitive functioning.    SLP provided education re: importance of reducing distractions during converation and Supervision for safety w/ ADLs and further skilled ST services to address any Cognitive-linguistic related strategies to increase overall safety awareness w/ ADLs, communication effectiveness, and quality of life.    SLP Assessment  SLP Recommendation/Assessment: All further Speech Lanaguage Pathology  needs can be addressed in the next venue of care SLP  Visit Diagnosis: Cognitive communication deficit (R41.841)    Recommendations for follow up therapy are one component of a multi-disciplinary discharge planning process, led by the attending physician.  Recommendations may be updated based on patient status, additional functional criteria and insurance authorization.    Follow Up Recommendations   (TBD)    Frequency and Duration  (n/a)   (n/a)      SLP Evaluation Cognition  Overall Cognitive Status: History of cognitive impairments - at baseline Arousal/Alertness: Awake/alert Orientation Level: Oriented to person;Oriented to place;Oriented to situation ("10am"; self-corrected the year and month) Year:  (1922) Month: November Attention: Focused;Sustained Focused Attention: Impaired Focused Attention Impairment: Verbal complex;Functional complex Sustained Attention: Impaired Sustained Attention Impairment: Verbal complex;Functional complex Memory: Impaired Memory Impairment: Decreased recall of new information Immediate Memory Recall: Sock Memory Recall Sock: Without Cue Memory Recall Blue: With Cue Memory Recall Bed: Without Cue Awareness: Impaired Awareness Impairment: Anticipatory impairment Problem Solving: Impaired Problem Solving Impairment: Verbal complex Executive Function: Reasoning;Organizing;Sequencing Reasoning: Impaired Reasoning Impairment: Verbal complex Sequencing: Impaired Sequencing Impairment: Verbal complex Organizing: Impaired Organizing Impairment: Verbal complex Behaviors:  (Tangential) Safety/Judgment: Impaired Comments: tangential during responses of verbal problem-solving       Comprehension  Auditory Comprehension Overall Auditory Comprehension: Impaired Yes/No Questions: Impaired Commands: Impaired Conversation: Simple Other Conversation Comments: tangential Interfering Components: Processing speed (?) EffectiveTechniques: Slowed speech (baseline for him) Visual  Recognition/Discrimination Discrimination: Not tested Reading Comprehension Reading Status: Not tested    Expression Expression Primary Mode of Expression: Verbal Verbal Expression Overall Verbal Expression: Appears within functional limits for tasks assessed (grossly) Initiation: No impairment Automatic Speech: Name;Social Response;Counting;Day of week Level of Generative/Spontaneous Verbalization: Sentence;Conversation  Repetition: Impaired Level of Impairment: Phrase level (retrieval of info.) Naming: No impairment Pragmatics:  (mild chuckling and tangential speech suspect in order to cover any Cognitive deficits during conversation) Interfering Components: Premorbid deficit Non-Verbal Means of Communication: Not applicable Written Expression Dominant Hand: Right Written Expression: Not tested   Oral / Motor  Oral Motor/Sensory Function Overall Oral Motor/Sensory Function: Mild impairment (Slight -- may be Baseline sec. to previous multiple strokes.) Facial ROM: Reduced left (slight decreased tone in corner of mouth) Facial Symmetry: Abnormal symmetry left (slight decreased tone in corner of mouth) Facial Strength: Within Functional Limits Facial Sensation: Within Functional Limits Lingual ROM: Within Functional Limits Lingual Symmetry: Within Functional Limits Lingual Strength: Within Functional Limits Lingual Sensation: Within Functional Limits [place]um: Within Functional Limits Mandible: Within Functional Limits Motor Speech Overall Motor Speech: Appears within functional limits for tasks assessed Respiration: Within functional limits Phonation: Normal Resonance: Within functional limits Articulation: Within functional limitis Intelligibility: Intelligible Motor Planning: Witnin functional limits Motor Speech Errors: Not applicable   GO                       Orinda Kenner, [place], CCC-SLP Speech Language Pathologist Rehab  Services [phone] [facility] 10/04/2020, 10:01 AM

## 2020-10-04 NOTE — Progress Notes (Signed)
PROGRESS NOTE  Steve Ramirez TIW:[id number] DOB: 26-Oct-1935 DOA: 10/03/2020 PCP: Sharilyn Sites, MD   LOS: 0 days   Brief narrative:  Steve Ramirez is a 85 y.o. male with medical history significant of hypertension, hyperlipidemia, diabetes mellitus, multiple stroke and TIA (3 strokes and 7 TIA per his son), hypothyroidism, depression, diverticular GI bleeding, CAD, syncope, presented to hospital with altered mental status and slurred speech.  Patient also had mild left facial droop which was likely at baseline per his family.  At baseline he is oriented x3 but in the hospital was little confused.  Patient wife has been admitted to the hospital due to meningitis.  In the ED patient was noted to be afebrile with no leukocytosis.  Blood pressure was elevated.  CT of the head was negative for acute findings.  Chest x-ray was negative.  Patient was then placed in the hospital for further evaluation and treatment.   Assessment/Plan:  Principal Problem:   TIA (transient ischemic attack) Active Problems:   Diabetes mellitus without complication (HCC)   Hyperlipidemia   History of TIAs   Essential hypertension   CVA (cerebral vascular accident) ([place])   Depression   Hypothyroidism   CAD (coronary artery disease)   Acute metabolic encephalopathy  TIA  Patient with history of stroke and TIA.  MRI of the brain without any acute findings but chronic infarcts noted. . MRI/MRA showed no acute intracranial hemorrhage or infarct, and stable parenchymal volume loss and advanced chronic white matter microangiopathy, with uchanged focal stenosis.  Neurology was consulted and recommended to continue aspirin Plavix and statins.  Wert panel showed LDL of 61.  Hemoglobin A1c pending.  Speech therapy has seen the patient and patient is likely at his baseline but might benefit from outpatient follow-up for dementia evaluation.  2D echocardiogram pending.  Physical therapy and Occupational Therapy has  recommended home health on discharge.  Communicated with neurology who also recommends carotid duplex ultrasound.  Diabetes mellitus without complication. Recent hemoglobin A1c 7.0.  Patient is taking metformin at home.  We will continue with sliding scale insulin while in the hospital  Hyperlipidemia Tinea statins.   History of multiple TIAs and CVA -On Plavix, aspirin, pravastatin.  Speech and mild facial droop likely at baseline from previous stroke.  Discontinue IV fluids.   Essential hypertension -Continue home lisinopril, metoprolol.  Closely monitor blood pressure.  Permissive hypertension for now.    Hypothyroidism Continue Synthroid.   CAD (coronary artery disease):  No active issues. Continue aspirin, Plavix, pravastatin   Acute metabolic encephalopathy: Possibly due to TIA with possible vascular dementia.  Will need outpatient evaluation for dementia cognition. Mentation at baseline at this time.   DVT prophylaxis: enoxaparin (LOVENOX) injection 40 mg Start: 10/03/20 1400   Code Status: Full code  Family Communication: Spoke with the patient's daughter-in-law Ms. [name] on the phone at [phone]  Status is: Observation  The patient will require care spanning > 2 midnights and should be moved to inpatient because: IV treatments appropriate due to intensity of illness or inability to take PO and Inpatient level of care appropriate due to severity of illness  Dispo: The patient is from:  Assisted living facility              Anticipated d/c is to: ALF, patient lives at [facility] assisted living facility with the wife who is hospitalized at this time.  Transition of care on board for disposition.  Patient currently is not medically stable to d/c.   Difficult to place patient No   Consultants: Neurology  Procedures: None  Anti-infectives:  None  Anti-infectives (From admission, onward)    None       Subjective: Today, patient was seen and  examined at bedside.  Patient denies headache, dizziness, lightheadedness.  Speech therapy at bedside and patient has baseline speech function at this time.  Objective: Vitals:   10/04/20 1204 10/04/20 1218  BP: (!) 193/72 (!) 199/88  Pulse: (!) 55   Resp: 14   Temp: 97.7 F (36.5 C)   SpO2: 100%     Intake/Output Summary (Last 24 hours) at 10/04/2020 1236 Last data filed at 10/04/2020 1128 Gross per 24 hour  Intake 986.25 ml  Output 3050 ml  Net -2063.75 ml   Filed Weights   10/03/20 0959  Weight: 84.4 kg   Body mass index is 28.28 kg/m.   Physical Exam:  GENERAL: Patient is alert awake and communicative,. Not in obvious distress. HENT: No scleral pallor or icterus. Pupils equally reactive to light. Oral mucosa is moist NECK: is supple, no gross swelling noted. CHEST: Clear to auscultation. No crackles or wheezes.  Diminished breath sounds bilaterally. CVS: S1 and S2 heard, no murmur. Regular rate and rhythm.  ABDOMEN: Soft, non-tender, bowel sounds are present. EXTREMITIES: No edema. CNS: Mild labial fold flattening.  Moves all extremities.  Speech distinct. SKIN: warm and dry without rashes.  Data Review: I have personally reviewed the following laboratory data and studies,  CBC: Recent Labs  Lab 10/03/20 1000  WBC 6.5  NEUTROABS 4.7  HGB 11.3*  HCT 36.9*  MCV 72.4*  PLT 638   Basic Metabolic Panel: Recent Labs  Lab 10/03/20 1000  NA 135  K 4.0  CL 100  CO2 24  GLUCOSE 171*  BUN 14  CREATININE 0.76  CALCIUM 9.6   Liver Function Tests: Recent Labs  Lab 10/03/20 1000  AST 16  ALT 13  ALKPHOS 59  BILITOT 0.8  PROT 7.4  ALBUMIN 4.1   No results for input(s): LIPASE, AMYLASE in the last 168 hours. No results for input(s): AMMONIA in the last 168 hours. Cardiac Enzymes: No results for input(s): CKTOTAL, CKMB, CKMBINDEX, TROPONINI in the last 168 hours. BNP (last 3 results) No results for input(s): BNP in the last 8760 hours.  ProBNP (last  3 results) No results for input(s): PROBNP in the last 8760 hours.  CBG: Recent Labs  Lab 10/03/20 2209 10/04/20 0934  GLUCAP 115* 194*   Recent Results (from the past 240 hour(s))  Resp Panel by RT-PCR (Flu A&B, Covid) Nasopharyngeal Swab     Status: None   Collection Time: 10/03/20  2:30 PM   Specimen: Nasopharyngeal Swab; Nasopharyngeal(NP) swabs in vial transport medium  Result Value Ref Range Status   SARS Coronavirus 2 by RT PCR NEGATIVE NEGATIVE Final    Comment: (NOTE) SARS-CoV-2 target nucleic acids are NOT DETECTED.  The SARS-CoV-2 RNA is generally detectable in upper respiratory specimens during the acute phase of infection. The lowest concentration of SARS-CoV-2 viral copies this assay can detect is 138 copies/mL. A negative result does not preclude SARS-Cov-2 infection and should not be used as the sole basis for treatment or other patient management decisions. A negative result may occur with  improper specimen collection/handling, submission of specimen other than nasopharyngeal swab, presence of viral mutation(s) within the areas targeted by this assay, and inadequate number of viral copies(<138 copies/mL). A negative  result must be combined with clinical observations, patient history, and epidemiological information. The expected result is Negative.  Fact Sheet for Patients:  EntrepreneurPulse.com.au  Fact Sheet for Healthcare Providers:  IncredibleEmployment.be  This test is no t yet approved or cleared by the [place] FDA and  has been authorized for detection and/or diagnosis of SARS-CoV-2 by FDA under an Emergency Use Authorization (EUA). This EUA will remain  in effect (meaning this test can be used) for the duration of the COVID-19 declaration under Section 564(b)(1) of the Act, 21 U.S.C.section 360bbb-3(b)(1), unless the authorization is terminated  or revoked sooner.       Influenza A by PCR NEGATIVE  NEGATIVE Final   Influenza B by PCR NEGATIVE NEGATIVE Final    Comment: (NOTE) The Xpert Xpress SARS-CoV-2/FLU/RSV plus assay is intended as an aid in the diagnosis of influenza from Nasopharyngeal swab specimens and should not be used as a sole basis for treatment. Nasal washings and aspirates are unacceptable for Xpert Xpress SARS-CoV-2/FLU/RSV testing.  Fact Sheet for Patients: EntrepreneurPulse.com.au  Fact Sheet for Healthcare Providers: IncredibleEmployment.be  This test is not yet approved or cleared by the [place] FDA and has been authorized for detection and/or diagnosis of SARS-CoV-2 by FDA under an Emergency Use Authorization (EUA). This EUA will remain in effect (meaning this test can be used) for the duration of the COVID-19 declaration under Section 564(b)(1) of the Act, 21 U.S.C. section 360bbb-3(b)(1), unless the authorization is terminated or revoked.  Performed at [facility], [address] [place], [place] [address]   Blood culture (routine single)     Status: None (Preliminary result)   Collection Time: 10/03/20  3:30 PM   Specimen: BLOOD  Result Value Ref Range Status   Specimen Description BLOOD BLOOD LEFT HAND  Final   Special Requests   Final    BOTTLES DRAWN AEROBIC AND ANAEROBIC Blood Culture adequate volume   Culture   Final    NO GROWTH < 24 HOURS Performed at [facility], [address], [place], [place] [address]    Report Status PENDING  Incomplete     Studies: CT HEAD WO CONTRAST (5MM)  Result Date: 10/03/2020 CLINICAL DATA:  Mental status change, unknown cause EXAM: CT HEAD WITHOUT CONTRAST TECHNIQUE: Contiguous axial images were obtained from the base of the skull through the vertex without intravenous contrast. COMPARISON:  MRI 08/15/2020.  CT head 05/26/2017. FINDINGS: Brain: No evidence of acute large vascular territory infarction, hemorrhage, hydrocephalus, extra-axial  collection or mass lesion/mass effect. Remote infarcts in the left basal ganglia and bilateral thalami. Additional advanced patchy and confluent white matter hypodensity, nonspecific but compatible with chronic microvascular ischemic disease. Partially empty sella. Vascular: No hyperdense vessel identified. Calcific intracranial atherosclerosis. Skull: No acute fracture. Sinuses/Orbits: Mild paranasal sinus mucosal thickening. Unremarkable visualized orbits. Other: No mastoid effusions. IMPRESSION: 1. No evidence of acute intracranial abnormality. 2. Similar advanced chronic microvascular ischemic disease and remote infarcts, detailed above. Electronically Signed   By: Margaretha Sheffield M.D.   On: 10/03/2020 11:18   MR ANGIO HEAD WO CONTRAST  Result Date: 10/03/2020 CLINICAL DATA:  TIA, slurred speech, expressive aphasia, confusion EXAM: MRI HEAD WITHOUT CONTRAST MRA HEAD WITHOUT CONTRAST TECHNIQUE: Multiplanar, multi-echo pulse sequences of the brain and surrounding structures were acquired without intravenous contrast. Angiographic images of the Circle of Willis were acquired using MRA technique without intravenous contrast. COMPARISON:  Same-day noncontrast CT head, brain MRI 08/15/2020 FINDINGS: MRI HEAD FINDINGS Brain: There is no  evidence of acute intracranial hemorrhage, extra-axial fluid collection, or acute infarct. There are remote lacunar infarcts in the bilateral basal ganglia, left more than right. There is moderate parenchymal volume loss with commensurate enlargement of the ventricular system. There is confluent FLAIR signal abnormality throughout the supratentorial white matter likely reflecting sequela of advanced chronic white matter microangiopathy. There is no mass lesion.  There is no midline shift. Vascular: See below Skull and upper cervical spine: Normal marrow signal. Sinuses/Orbits: There is mild mucosal thickening in the maxillary sinuses. The globes and orbits are unremarkable. Other:  None. MRA HEAD FINDINGS Anterior circulation: The bilateral intracranial ICAs are patent. The bilateral MCAs and ACAs are patent. There is no aneurysm. Posterior circulation: The V4 segments of the vertebral arteries are patent, with the left being dominant, unchanged. The basilar artery is patent. There is focal diminished enhancement of the proximal right P2 segment without evidence of complete occlusion (5-117, coronal MRA reformat key image). This is similar in appearance to the prior study from 08/15/2020. The bilateral PCAs are otherwise patent. There is no aneurysm. Anatomic variants: None. IMPRESSION: 1. No acute intracranial hemorrhage or infarct. 2. Stable parenchymal volume loss and advanced chronic white matter microangiopathy. 3. Unchanged focal stenosis of the right P2 segment. Otherwise, patent intracranial vasculature. Electronically Signed   By: Valetta Mole M.D.   On: 10/03/2020 14:17   MR BRAIN WO CONTRAST  Result Date: 10/03/2020 CLINICAL DATA:  TIA, slurred speech, expressive aphasia, confusion EXAM: MRI HEAD WITHOUT CONTRAST MRA HEAD WITHOUT CONTRAST TECHNIQUE: Multiplanar, multi-echo pulse sequences of the brain and surrounding structures were acquired without intravenous contrast. Angiographic images of the Circle of Willis were acquired using MRA technique without intravenous contrast. COMPARISON:  Same-day noncontrast CT head, brain MRI 08/15/2020 FINDINGS: MRI HEAD FINDINGS Brain: There is no evidence of acute intracranial hemorrhage, extra-axial fluid collection, or acute infarct. There are remote lacunar infarcts in the bilateral basal ganglia, left more than right. There is moderate parenchymal volume loss with commensurate enlargement of the ventricular system. There is confluent FLAIR signal abnormality throughout the supratentorial white matter likely reflecting sequela of advanced chronic white matter microangiopathy. There is no mass lesion.  There is no midline shift.  Vascular: See below Skull and upper cervical spine: Normal marrow signal. Sinuses/Orbits: There is mild mucosal thickening in the maxillary sinuses. The globes and orbits are unremarkable. Other: None. MRA HEAD FINDINGS Anterior circulation: The bilateral intracranial ICAs are patent. The bilateral MCAs and ACAs are patent. There is no aneurysm. Posterior circulation: The V4 segments of the vertebral arteries are patent, with the left being dominant, unchanged. The basilar artery is patent. There is focal diminished enhancement of the proximal right P2 segment without evidence of complete occlusion (5-117, coronal MRA reformat key image). This is similar in appearance to the prior study from 08/15/2020. The bilateral PCAs are otherwise patent. There is no aneurysm. Anatomic variants: None. IMPRESSION: 1. No acute intracranial hemorrhage or infarct. 2. Stable parenchymal volume loss and advanced chronic white matter microangiopathy. 3. Unchanged focal stenosis of the right P2 segment. Otherwise, patent intracranial vasculature. Electronically Signed   By: Valetta Mole M.D.   On: 10/03/2020 14:17   DG Chest Port 1 View  Result Date: 10/03/2020 CLINICAL DATA:  Questionable sepsis, sudden onset slurred speech and confusion, history of smoking EXAM: PORTABLE CHEST 1 VIEW COMPARISON:  04/15/2020 FINDINGS: The heart size and mediastinal contours are within normal limits. Both lungs are clear. There may be minimal residual scarring  of the posterior right upper lobe at the site of previously seen airspace disease. The visualized skeletal structures are unremarkable. IMPRESSION: No acute abnormality of the lungs in AP portable projection. Electronically Signed   By: Eddie Candle M.D.   On: 10/03/2020 10:52      Flora Lipps, MD  [facility] 10/04/2020  If 7PM-7AM, please contact night-coverage

## 2020-10-04 NOTE — Plan of Care (Signed)
Patient is A/O X 4. Patient was able to see his wife in the progressive unit. Patient reports no pain at this time.  CBG was 145 at HS. Vital signs are stable. NIH score 0. Will continue to monitor.  Problem: Education: Goal: Knowledge of General Education information will improve Description: Including pain rating scale, medication(s)/side effects and non-pharmacologic comfort measures Outcome: Progressing   Problem: Health Behavior/Discharge Planning: Goal: Ability to manage health-related needs will improve Outcome: Progressing   Problem: Coping: Goal: Level of anxiety will decrease Outcome: Progressing   Problem: Activity: Goal: Risk for activity intolerance will decrease Outcome: Progressing   Problem: Skin Integrity: Goal: Risk for impaired skin integrity will decrease Outcome: Progressing   Problem: Safety: Goal: Ability to remain free from injury will improve Outcome: Progressing   Problem: Pain Managment: Goal: General experience of comfort will improve Outcome: Progressing

## 2020-10-04 NOTE — Progress Notes (Signed)
Patient pulled out second IV. MD notified and is okay with no IV access at this time. Patient to be discharged back to [facility].

## 2020-10-04 NOTE — Progress Notes (Signed)
Student reported elevated BP to Cherlyn Labella, RNMardella Layman, RN

## 2020-10-04 NOTE — TOC Progression Note (Signed)
Transition of Care [facility]) - Progression Note    Patient Details  Name: Steve Ramirez MRN: [id number] Date of Birth: [birth year]  Transition of Care [facility]) CM/SW Roberts, RN Phone Number: 10/04/2020, 1:31 PM  Clinical Narrative:   Patient is a resident of [facility].  As per Lattie Haw at [place], patient can return tomorrow, sending [facility] referral with patient.  She will look into transportation and get back to [place].  Facility will be at an event in the morning, but it is possible that daughter can transport.  TOC to follow         Expected Discharge Plan and Services                                                 Social Determinants of Health (SDOH) Interventions    Readmission Risk Interventions No flowsheet data found.

## 2020-10-04 NOTE — Evaluation (Signed)
Physical Therapy Evaluation Patient Details Name: Steve Ramirez MRN: [id number] DOB: 12-Jul-1935 Today's Date: 10/04/2020  History of Present Illness  Steve Ramirez is a 85 y.o. male with medical history significant of hypertension, hyperlipidemia, diabetes mellitus, multiple stroke and TIA (3 strokes and 7 TIA per his son), hypothyroidism, depression, diverticular GI bleeding, CAD, syncope, who presents with altered mental status and slurred speech.   Clinical Impression  Patient received sitting on side of bed with daughter in law present. Assisted patient in getting dressed at edge of bed. He performed sit to stand with min guard and RW. Ambulated 200 feet with RW and min guard. No overt lob, slow pace, distracted. Patient will continue to benefit from skilled PT while here to improve independence and safety with mobility.        Recommendations for follow up therapy are one component of a multi-disciplinary discharge planning process, led by the attending physician.  Recommendations may be updated based on patient status, additional functional criteria and insurance authorization.  Follow Up Recommendations Home health PT    Equipment Recommendations  None recommended by PT    Recommendations for Other Services       Precautions / Restrictions Precautions Precautions: Fall Restrictions Weight Bearing Restrictions: No      Mobility  Bed Mobility               General bed mobility comments: patient received sitting up on side of bed, daughter in law present.    Transfers Overall transfer level: Needs assistance Equipment used: Rolling walker (2 wheeled) Transfers: Sit to/from Stand Sit to Stand: Min guard         General transfer comment: increased effort and a couple of attempts to stand  Ambulation/Gait Ambulation/Gait assistance: Min guard Gait Distance (Feet): 200 Feet Assistive device: Rolling walker (2 wheeled) Gait Pattern/deviations:  Step-through pattern;Decreased step length - right;Decreased step length - left;Decreased stride length Gait velocity: decreased   General Gait Details: Patient requires some directional cues, slow pace, no lob. Easily distracted  Stairs            Wheelchair Mobility    Modified Rankin (Stroke Patients Only) Modified Rankin (Stroke Patients Only) Pre-Morbid Rankin Score: Slight disability Modified Rankin: Moderately severe disability     Balance Overall balance assessment: Needs assistance Sitting-balance support: Feet supported Sitting balance-Leahy Scale: Good     Standing balance support: Bilateral upper extremity supported;During functional activity Standing balance-Leahy Scale: Fair Standing balance comment: requires B UE support. Min guard for safety                             Pertinent Vitals/Pain Pain Assessment: No/denies pain    Home Living Family/patient expects to be discharged to:: Assisted living Living Arrangements: Spouse/significant other Available Help at Discharge: Family Type of Home: Assisted living Home Access: Level entry     Home Layout: One level Home Equipment: Walker - 2 wheels      Prior Function Level of Independence: Independent with assistive device(s)         Comments: spouse is cuurently hospitalized as well. Has walker, they live in Assisted Living at [facility]: Right    Extremity/Trunk Assessment   Upper Extremity Assessment Upper Extremity Assessment: Defer to OT evaluation    Lower Extremity Assessment Lower Extremity Assessment: Generalized weakness    Cervical / Trunk Assessment Cervical / Trunk  Assessment: Normal  Communication   Communication: No difficulties  Cognition Arousal/Alertness: Awake/alert Behavior During Therapy: WFL for tasks assessed/performed Overall Cognitive Status: History of cognitive impairments - at baseline                                  General Comments: patient requiring directional cues, seems a little confused, but possibly at baseline. He oriented x4      General Comments      Exercises     Assessment/Plan    PT Assessment Patient needs continued PT services  PT Problem List Decreased strength;Decreased mobility;Decreased activity tolerance;Decreased balance;Decreased safety awareness;Decreased cognition       PT Treatment Interventions Therapeutic activities;Gait training;Therapeutic exercise;Functional mobility training;Balance training;Patient/family education    PT Goals (Current goals can be found in the Care Plan section)  Acute Rehab PT Goals Patient Stated Goal: return home, visit wife PT Goal Formulation: With patient Time For Goal Achievement: 10/11/20 Potential to Achieve Goals: Good    Frequency Min 2X/week   Barriers to discharge Decreased caregiver support lives in assisted living, wife presently in hospital also. May need higher level of care at discharge    Co-evaluation               AM-PAC PT "6 Clicks" Mobility  Outcome Measure Help needed turning from your back to your side while in a flat bed without using bedrails?: A Little Help needed moving from lying on your back to sitting on the side of a flat bed without using bedrails?: A Little Help needed moving to and from a bed to a chair (including a wheelchair)?: A Little Help needed standing up from a chair using your arms (e.g., wheelchair or bedside chair)?: A Little Help needed to walk in hospital room?: A Little Help needed climbing 3-5 steps with a railing? : A Little 6 Click Score: 18    End of Session Equipment Utilized During Treatment: Gait belt Activity Tolerance: Patient tolerated treatment well Patient left: in bed;with nursing/sitter in room;with bed alarm set;with family/visitor present Nurse Communication: Mobility status PT Visit Diagnosis: Muscle weakness (generalized)  (M62.81);Other abnormalities of gait and mobility (R26.89)    Time: 7035-0093 PT Time Calculation (min) (ACUTE ONLY): 23 min   Charges:   PT Evaluation $PT Eval Moderate Complexity: 1 Mod PT Treatments $Gait Training: 8-22 mins        Jaidence Geisler, PT, GCS 10/04/20,10:05 AM

## 2020-10-04 NOTE — Progress Notes (Signed)
OT Cancellation Note  Patient Details Name: Steve Ramirez MRN: [id number] DOB: [birth year]   Cancelled Treatment:    Reason Eval/Treat Not Completed: Patient declined, no reason specified. Consult received, chart reviewed. Upon attempt, pt reports just back to bed and declines OT despite encouragement. Will re-attempt at later time.   Ardeth Perfect., MPH, MS, OTR/L ascom [phone] 10/04/20, 10:36 AM

## 2020-10-05 DIAGNOSIS — I639 Cerebral infarction, unspecified: Secondary | ICD-10-CM | POA: Diagnosis not present

## 2020-10-05 DIAGNOSIS — F32A Depression, unspecified: Secondary | ICD-10-CM | POA: Diagnosis not present

## 2020-10-05 DIAGNOSIS — G9341 Metabolic encephalopathy: Secondary | ICD-10-CM | POA: Diagnosis not present

## 2020-10-05 DIAGNOSIS — E119 Type 2 diabetes mellitus without complications: Secondary | ICD-10-CM | POA: Diagnosis not present

## 2020-10-05 LAB — GLUCOSE, CAPILLARY: Glucose-Capillary: 185 mg/dL — ABNORMAL HIGH (ref 70–99)

## 2020-10-05 NOTE — Evaluation (Signed)
Occupational Therapy Evaluation Patient Details Name: Steve Ramirez MRN: [id number] DOB: 1935-05-23 Today's Date: 10/05/2020   History of Present Illness Steve Ramirez is a 85 y.o. male with medical history significant of hypertension, hyperlipidemia, diabetes mellitus, multiple stroke and TIA (3 strokes and 7 TIA per his son), hypothyroidism, depression, diverticular GI bleeding, CAD, syncope, who presents with altered mental status and slurred speech.   Clinical Impression   Pt seen for OT evaluation this date in setting of acute hospitalization d/t TIA. Pt presents this date with some slightly decreased balance and safety awareness, but only requires minimal cueing for safe use of RW for STS and fxl mobility. Pt appears to have improved mentation this AM and in addition, has not gross LOB with ADLs/ADL mobility. At baseline, pt does not appear to consistently use RW. OT provides safety education including safe sequence for RW use. Pt with moderate reception, but will likely require at least intermittent SUPV for safety with mobility and more dynamic ADL tasks such as bathing and IADLs. No further acute skilled OT needs identified at this time.      Recommendations for follow up therapy are one component of a multi-disciplinary discharge planning process, led by the attending physician.  Recommendations may be updated based on patient status, additional functional criteria and insurance authorization.   Follow Up Recommendations  No OT follow up;Supervision - Intermittent    Equipment Recommendations  Other (comment) (2ww)    Recommendations for Other Services       Precautions / Restrictions Precautions Precautions: Fall Restrictions Weight Bearing Restrictions: No      Mobility Bed Mobility               General bed mobility comments: up to chair pre/post session    Transfers Overall transfer level: Needs assistance Equipment used: Rolling walker (2  wheeled) Transfers: Sit to/from Stand Sit to Stand: Supervision         General transfer comment: increased time, but steady with ascent/descent, requires one cue to reach back to control sit.    Balance Overall balance assessment: Needs assistance Sitting-balance support: Feet supported Sitting balance-Leahy Scale: Good     Standing balance support: Bilateral upper extremity supported;During functional activity   Standing balance comment: able to alterante UE support from RW for standing ADL tasks.                           ADL either performed or assessed with clinical judgement   ADL Overall ADL's : Modified independent;At baseline                                       General ADL Comments: increased time, but does not require physical assistance to perform ADLs. Pt's RN reports improvement over yesterday's presentation     Vision Patient Visual Report: No change from baseline       Perception     Praxis      Pertinent Vitals/Pain Pain Assessment: No/denies pain     Hand Dominance Right   Extremity/Trunk Assessment Upper Extremity Assessment Upper Extremity Assessment: Overall WFL for tasks assessed;Generalized weakness (ROM WFL, MMT grossly 4-/5)   Lower Extremity Assessment Lower Extremity Assessment: Overall WFL for tasks assessed;Generalized weakness   Cervical / Trunk Assessment Cervical / Trunk Assessment: Normal   Communication Communication Communication: No difficulties   Cognition Arousal/Alertness:  Awake/alert Behavior During Therapy: WFL for tasks assessed/performed Overall Cognitive Status: History of cognitive impairments - at baseline                                 General Comments: Pt is pleasant, able to follow commands, slightly confused, still thinks he is at his facility, but otherwise oriented to sitation, self and month/year.   General Comments       Exercises Other Exercises Other  Exercises: OT facilitates ed re: safe use of RW. Pt demos good understanding.   Shoulder Instructions      Home Living Family/patient expects to be discharged to:: Assisted living Living Arrangements: Spouse/significant other Available Help at Discharge: Family Type of Home: Assisted living Home Access: Level entry     Home Layout: One level     Bathroom Shower/Tub: Walk-in shower         Home Equipment: Environmental consultant - 2 wheels      Lives With: Spouse    Prior Functioning/Environment Level of Independence: Independent with assistive device(s)        Comments: spouse is cuurently hospitalized as well. Has walker, they live in [facility] List: Decreased safety awareness      OT Treatment/Interventions: Self-care/ADL training;Therapeutic exercise;Therapeutic activities    OT Goals(Current goals can be found in the care plan section) Acute Rehab OT Goals Patient Stated Goal: return home, visit wife OT Goal Formulation: All assessment and education complete, DC therapy  OT Frequency: Min 1X/week   Barriers to D/C:            Co-evaluation              AM-PAC OT "6 Clicks" Daily Activity     Outcome Measure Help from another person eating meals?: None Help from another person taking care of personal grooming?: None Help from another person toileting, which includes using toliet, bedpan, or urinal?: None Help from another person bathing (including washing, rinsing, drying)?: A Little (SUPV would be ideal d/t wet surface.) Help from another person to put on and taking off regular upper body clothing?: None Help from another person to put on and taking off regular lower body clothing?: None 6 Click Score: 23   End of Session Equipment Utilized During Treatment: Gait belt Nurse Communication: Mobility status  Activity Tolerance: Patient tolerated treatment well Patient left: in chair;with call bell/phone within reach  OT  Visit Diagnosis: Other symptoms and signs involving the nervous system (R29.898);Other symptoms and signs involving cognitive function                Time: 7867-5449 OT Time Calculation (min): 11 min Charges:  OT General Charges $OT Visit: 1 Visit OT Evaluation $OT Eval Low Complexity: [place], [place], OTR/L ascom [phone] 10/05/20, 9:08 AM

## 2020-10-05 NOTE — Progress Notes (Signed)
Patient refusing bed/chair alarm and walker use. Patient verbalizes complete understanding of purpose of safety precautions and fully understands consequences of falling. Continues to refuse.

## 2020-10-05 NOTE — Discharge Summary (Addendum)
Physician Discharge Summary  Steve Ramirez ZTI:[id number] DOB: January 16, 1935 DOA: 10/03/2020  PCP: Sharilyn Sites, MD  Admit date: 10/03/2020 Discharge date: 10/05/2020  Admitted From: ALF  Discharge disposition: ALF  Recommendations for Outpatient Follow-Up:   Follow up with your primary care provider in one week.  Check CBC, BMP, magnesium in the next visit Please consider 2D echocardiogram in a week. Follow-up with your neurologist as outpatient.  Might need outpatient dementia evaluation  Discharge Diagnosis:   Principal Problem:   TIA (transient ischemic attack) Active Problems:   Diabetes mellitus without complication (HCC)   Hyperlipidemia   History of TIAs   Essential hypertension   CVA (cerebral vascular accident) ([place])   Depression   Hypothyroidism   CAD (coronary artery disease)   Acute metabolic encephalopathy   Discharge Condition: Improved.  Diet recommendation: Low sodium, heart healthy.  Carbohydrate-modified.    Wound care: None.  Code status: Full.   History of Present Illness:   Steve Ramirez is a 85 y.o. male with medical history significant of hypertension, hyperlipidemia, diabetes mellitus, multiple stroke and TIA (3 strokes and 7 TIA per his son), hypothyroidism, depression, diverticular GI bleeding, CAD, syncope, presented to hospital with altered mental status and slurred speech.  Patient also had mild left facial droop which was likely at baseline per his family.  At baseline, he is oriented x3 but in the hospital was little confused.  Patient wife has been admitted to the hospital due to meningitis.  In the ED, patient was noted to be afebrile with no leukocytosis.  Blood pressure was elevated.  CT of the head was negative for acute findings.  Chest x-ray was negative.  Patient was then placed in the hospital for further evaluation and treatment.   Hospital Course:   Following conditions were addressed during hospitalization as  listed below,  TIA  Patient with history of stroke and TIA.  MRI of the brain without any acute findings but chronic infarcts noted. . MRI/MRA showed no acute intracranial hemorrhage or infarct, and stable parenchymal volume loss and advanced chronic white matter microangiopathy, with uchanged focal stenosis.  Neurology was consulted and recommended to continue aspirin, Plavix and statins.  Lipid panel showed LDL of 61.  Hemoglobin A1c pending.  Speech therapy has seen the patient and patient is likely at his baseline but might benefit from outpatient follow-up for dementia evaluation.  2D echocardiogram pending.  Physical therapy and Occupational Therapy has recommended home health on discharge.  Ultrasound of the carotid unremarkable.  Communicated with neurology Lindzen who recommend outpatient echocardiogram.     Diabetes mellitus without complication. Recent hemoglobin A1c of 6.6 patient is taking metformin at home.     Hyperlipidemia Continue statins.   History of multiple TIAs and CVA -On Plavix, aspirin, pravastatin.  Speech and mild facial droop likely at baseline from previous stroke.  Discontinue IV fluids.  MRI did not show any acute stroke   Essential hypertension -Continue home lisinopril, metoprolol.    Hypothyroidism Continue Synthroid.   CAD (coronary artery disease):  Continue aspirin, Plavix, pravastatin   Acute metabolic encephalopathy:  Resolved.  Possibly due to TIA with possible vascular dementia.  Will need outpatient evaluation for dementia cognition. Mentation at baseline at this time.  Disposition.  At this time, patient is stable for disposition home with outpatient PCP and neurology follow-up.  Medical Consultants:   Neurology  Procedures:    None Subjective:   Today, patient was seen and examined at bedside.  Patient denies any dizziness, lightheadedness, nausea, vomiting.  Discharge Exam:   Vitals:   10/05/20 0425 10/05/20 0826  BP: (!) 156/83  (!) 159/96  Pulse: 65 84  Resp: 20 16  Temp: 97.8 F (36.6 C) 98.6 F (37 C)  SpO2: 95% 96%   Vitals:   10/04/20 1949 10/05/20 0017 10/05/20 0425 10/05/20 0826  BP: 138/60 (!) 145/55 (!) 156/83 (!) 159/96  Pulse: 65 63 65 84  Resp: 18 18 20 16   Temp: 98.4 F (36.9 C) 98.2 F (36.8 C) 97.8 F (36.6 C) 98.6 F (37 C)  TempSrc: Oral Oral    SpO2: 97% 96% 95% 96%  Weight:      Height:       General: Alert awake, not in obvious distress HENT: pupils equally reacting to light,  No scleral pallor or icterus noted. Oral mucosa is moist.  Chest:  Clear breath sounds.  Diminished breath sounds bilaterally. No crackles or wheezes.  CVS: S1 &S2 heard. No murmur.  Regular rate and rhythm. Abdomen: Soft, nontender, nondistended.  Bowel sounds are heard.   Extremities: No cyanosis, clubbing or edema.  Peripheral pulses are palpable. Psych: Alert, awake and oriented, normal mood CNS: Flattening of the labial fold.  Moves all extremities.  Speech distinct. Skin: Warm and dry.  No rashes noted.  The results of significant diagnostics from this hospitalization (including imaging, microbiology, ancillary and laboratory) are listed below for reference.     Diagnostic Studies:   CT HEAD WO CONTRAST (5MM)  Result Date: 10/03/2020 CLINICAL DATA:  Mental status change, unknown cause EXAM: CT HEAD WITHOUT CONTRAST TECHNIQUE: Contiguous axial images were obtained from the base of the skull through the vertex without intravenous contrast. COMPARISON:  MRI 08/15/2020.  CT head 05/26/2017. FINDINGS: Brain: No evidence of acute large vascular territory infarction, hemorrhage, hydrocephalus, extra-axial collection or mass lesion/mass effect. Remote infarcts in the left basal ganglia and bilateral thalami. Additional advanced patchy and confluent white matter hypodensity, nonspecific but compatible with chronic microvascular ischemic disease. Partially empty sella. Vascular: No hyperdense vessel identified.  Calcific intracranial atherosclerosis. Skull: No acute fracture. Sinuses/Orbits: Mild paranasal sinus mucosal thickening. Unremarkable visualized orbits. Other: No mastoid effusions. IMPRESSION: 1. No evidence of acute intracranial abnormality. 2. Similar advanced chronic microvascular ischemic disease and remote infarcts, detailed above. Electronically Signed   By: Margaretha Sheffield M.D.   On: 10/03/2020 11:18   MR ANGIO HEAD WO CONTRAST  Result Date: 10/03/2020 CLINICAL DATA:  TIA, slurred speech, expressive aphasia, confusion EXAM: MRI HEAD WITHOUT CONTRAST MRA HEAD WITHOUT CONTRAST TECHNIQUE: Multiplanar, multi-echo pulse sequences of the brain and surrounding structures were acquired without intravenous contrast. Angiographic images of the Circle of Willis were acquired using MRA technique without intravenous contrast. COMPARISON:  Same-day noncontrast CT head, brain MRI 08/15/2020 FINDINGS: MRI HEAD FINDINGS Brain: There is no evidence of acute intracranial hemorrhage, extra-axial fluid collection, or acute infarct. There are remote lacunar infarcts in the bilateral basal ganglia, left more than right. There is moderate parenchymal volume loss with commensurate enlargement of the ventricular system. There is confluent FLAIR signal abnormality throughout the supratentorial white matter likely reflecting sequela of advanced chronic white matter microangiopathy. There is no mass lesion.  There is no midline shift. Vascular: See below Skull and upper cervical spine: Normal marrow signal. Sinuses/Orbits: There is mild mucosal thickening in the maxillary sinuses. The globes and orbits are unremarkable. Other: None. MRA HEAD FINDINGS Anterior circulation: The bilateral intracranial ICAs are patent. The bilateral MCAs and  ACAs are patent. There is no aneurysm. Posterior circulation: The V4 segments of the vertebral arteries are patent, with the left being dominant, unchanged. The basilar artery is patent. There is  focal diminished enhancement of the proximal right P2 segment without evidence of complete occlusion (5-117, coronal MRA reformat key image). This is similar in appearance to the prior study from 08/15/2020. The bilateral PCAs are otherwise patent. There is no aneurysm. Anatomic variants: None. IMPRESSION: 1. No acute intracranial hemorrhage or infarct. 2. Stable parenchymal volume loss and advanced chronic white matter microangiopathy. 3. Unchanged focal stenosis of the right P2 segment. Otherwise, patent intracranial vasculature. Electronically Signed   By: Valetta Mole M.D.   On: 10/03/2020 14:17   MR BRAIN WO CONTRAST  Result Date: 10/03/2020 CLINICAL DATA:  TIA, slurred speech, expressive aphasia, confusion EXAM: MRI HEAD WITHOUT CONTRAST MRA HEAD WITHOUT CONTRAST TECHNIQUE: Multiplanar, multi-echo pulse sequences of the brain and surrounding structures were acquired without intravenous contrast. Angiographic images of the Circle of Willis were acquired using MRA technique without intravenous contrast. COMPARISON:  Same-day noncontrast CT head, brain MRI 08/15/2020 FINDINGS: MRI HEAD FINDINGS Brain: There is no evidence of acute intracranial hemorrhage, extra-axial fluid collection, or acute infarct. There are remote lacunar infarcts in the bilateral basal ganglia, left more than right. There is moderate parenchymal volume loss with commensurate enlargement of the ventricular system. There is confluent FLAIR signal abnormality throughout the supratentorial white matter likely reflecting sequela of advanced chronic white matter microangiopathy. There is no mass lesion.  There is no midline shift. Vascular: See below Skull and upper cervical spine: Normal marrow signal. Sinuses/Orbits: There is mild mucosal thickening in the maxillary sinuses. The globes and orbits are unremarkable. Other: None. MRA HEAD FINDINGS Anterior circulation: The bilateral intracranial ICAs are patent. The bilateral MCAs and ACAs are  patent. There is no aneurysm. Posterior circulation: The V4 segments of the vertebral arteries are patent, with the left being dominant, unchanged. The basilar artery is patent. There is focal diminished enhancement of the proximal right P2 segment without evidence of complete occlusion (5-117, coronal MRA reformat key image). This is similar in appearance to the prior study from 08/15/2020. The bilateral PCAs are otherwise patent. There is no aneurysm. Anatomic variants: None. IMPRESSION: 1. No acute intracranial hemorrhage or infarct. 2. Stable parenchymal volume loss and advanced chronic white matter microangiopathy. 3. Unchanged focal stenosis of the right P2 segment. Otherwise, patent intracranial vasculature. Electronically Signed   By: Valetta Mole M.D.   On: 10/03/2020 14:17   US Carotid Bilateral  Result Date: 10/04/2020 CLINICAL DATA:  85 year old male with a history of stroke EXAM: BILATERAL CAROTID DUPLEX ULTRASOUND TECHNIQUE: Pearline Cables scale imaging, color Doppler and duplex ultrasound were performed of bilateral carotid and vertebral arteries in the neck. COMPARISON:  12/02/2016 FINDINGS: Criteria: Quantification of carotid stenosis is based on velocity parameters that correlate the residual internal carotid diameter with NASCET-based stenosis levels, using the diameter of the distal internal carotid lumen as the denominator for stenosis measurement. The following velocity measurements were obtained: RIGHT ICA:  Systolic 354 cm/sec, Diastolic 21 cm/sec CCA:  562 cm/sec SYSTOLIC ICA/CCA RATIO:  0.5 ECA:  108 cm/sec LEFT ICA:  Systolic 563 cm/sec, Diastolic 21 cm/sec CCA:  84 cm/sec SYSTOLIC ICA/CCA RATIO:  1.2 ECA:  128 cm/sec Right Brachial SBP: Not acquired Left Brachial SBP: Not acquired RIGHT CAROTID ARTERY: No significant calcifications of the right common carotid artery. Intermediate waveform maintained. Moderate heterogeneous and partially calcified plaque at the  right carotid bifurcation. No  significant lumen shadowing. Low resistance waveform of the right ICA. No significant tortuosity. RIGHT VERTEBRAL ARTERY: Antegrade flow with low resistance waveform. LEFT CAROTID ARTERY: No significant calcifications of the left common carotid artery. Intermediate waveform maintained. Moderate heterogeneous and partially calcified plaque at the left carotid bifurcation. No significant lumen shadowing. Low resistance waveform of the left ICA. No significant tortuosity. LEFT VERTEBRAL ARTERY:  Antegrade flow with low resistance waveform. IMPRESSION: Color duplex indicates moderate heterogeneous and calcified plaque, with no hemodynamically significant stenosis by duplex criteria in the extracranial cerebrovascular circulation. Signed, Dulcy Fanny. Dellia Nims, RPVI [facility] Electronically Signed   By: Corrie Mckusick D.O.   On: 10/04/2020 16:17   DG Chest Port 1 View  Result Date: 10/03/2020 CLINICAL DATA:  Questionable sepsis, sudden onset slurred speech and confusion, history of smoking EXAM: PORTABLE CHEST 1 VIEW COMPARISON:  04/15/2020 FINDINGS: The heart size and mediastinal contours are within normal limits. Both lungs are clear. There may be minimal residual scarring of the posterior right upper lobe at the site of previously seen airspace disease. The visualized skeletal structures are unremarkable. IMPRESSION: No acute abnormality of the lungs in AP portable projection. Electronically Signed   By: Eddie Candle M.D.   On: 10/03/2020 10:52     Labs:   Basic Metabolic Panel: Recent Labs  Lab 10/03/20 1000  NA 135  K 4.0  CL 100  CO2 24  GLUCOSE 171*  BUN 14  CREATININE 0.76  CALCIUM 9.6    GFR Estimated Creatinine Clearance: 75.3 mL/min (by C-G formula based on SCr of 0.76 mg/dL). Liver Function Tests: Recent Labs  Lab 10/03/20 1000  AST 16  ALT 13  ALKPHOS 59  BILITOT 0.8  PROT 7.4  ALBUMIN 4.1    No results for  input(s): LIPASE, AMYLASE in the last 168 hours. No results for input(s): AMMONIA in the last 168 hours. Coagulation profile Recent Labs  Lab 10/03/20 1000  INR 1.1     CBC: Recent Labs  Lab 10/03/20 1000  WBC 6.5  NEUTROABS 4.7  HGB 11.3*  HCT 36.9*  MCV 72.4*  PLT 301    Cardiac Enzymes: No results for input(s): CKTOTAL, CKMB, CKMBINDEX, TROPONINI in the last 168 hours. BNP: Invalid input(s): POCBNP CBG: Recent Labs  Lab 10/04/20 0934 10/04/20 1259 10/04/20 1721 10/04/20 2057 10/05/20 0821  GLUCAP 194* 131* 153* 142* 185*    D-Dimer No results for input(s): DDIMER in the last 72 hours. Hgb A1c Recent Labs    10/04/20 0647  HGBA1C 6.6*    Lipid Profile Recent Labs    10/04/20 0647  CHOL 117  HDL 28*  LDLCALC 61  TRIG 140  CHOLHDL 4.2    Thyroid function studies No results for input(s): TSH, T4TOTAL, T3FREE, THYROIDAB in the last 72 hours.  Invalid input(s): FREET3 Anemia work up No results for input(s): VITAMINB12, FOLATE, FERRITIN, TIBC, IRON, RETICCTPCT in the last 72 hours. Microbiology Recent Results (from the past 240 hour(s))  Urine Culture     Status: Abnormal   Collection Time: 10/03/20 11:30 AM   Specimen: In/Out Cath Urine  Result Value Ref Range Status   Specimen Description   Final    IN/OUT CATH URINE Performed at [facility], [address], [place], [place] [address]    Special Requests   Final    NONE Performed at [facility], [address], [place], [place] [address]  Culture MULTIPLE SPECIES PRESENT, SUGGEST RECOLLECTION (A)  Final   Report Status 10/04/2020 FINAL  Final  Resp Panel by RT-PCR (Flu A&B, Covid) Nasopharyngeal Swab     Status: None   Collection Time: 10/03/20  2:30 PM   Specimen: Nasopharyngeal Swab; Nasopharyngeal(NP) swabs in vial transport medium  Result Value Ref Range Status   SARS Coronavirus 2 by RT PCR NEGATIVE NEGATIVE Final    Comment: (NOTE) SARS-CoV-2 target  nucleic acids are NOT DETECTED.  The SARS-CoV-2 RNA is generally detectable in upper respiratory specimens during the acute phase of infection. The lowest concentration of SARS-CoV-2 viral copies this assay can detect is 138 copies/mL. A negative result does not preclude SARS-Cov-2 infection and should not be used as the sole basis for treatment or other patient management decisions. A negative result may occur with  improper specimen collection/handling, submission of specimen other than nasopharyngeal swab, presence of viral mutation(s) within the areas targeted by this assay, and inadequate number of viral copies(<138 copies/mL). A negative result must be combined with clinical observations, patient history, and epidemiological information. The expected result is Negative.  Fact Sheet for Patients:  EntrepreneurPulse.com.au  Fact Sheet for Healthcare Providers:  IncredibleEmployment.be  This test is no t yet approved or cleared by the [place] FDA and  has been authorized for detection and/or diagnosis of SARS-CoV-2 by FDA under an Emergency Use Authorization (EUA). This EUA will remain  in effect (meaning this test can be used) for the duration of the COVID-19 declaration under Section 564(b)(1) of the Act, 21 U.S.C.section 360bbb-3(b)(1), unless the authorization is terminated  or revoked sooner.       Influenza A by PCR NEGATIVE NEGATIVE Final   Influenza B by PCR NEGATIVE NEGATIVE Final    Comment: (NOTE) The Xpert Xpress SARS-CoV-2/FLU/RSV plus assay is intended as an aid in the diagnosis of influenza from Nasopharyngeal swab specimens and should not be used as a sole basis for treatment. Nasal washings and aspirates are unacceptable for Xpert Xpress SARS-CoV-2/FLU/RSV testing.  Fact Sheet for Patients: EntrepreneurPulse.com.au  Fact Sheet for Healthcare  Providers: IncredibleEmployment.be  This test is not yet approved or cleared by the [place] FDA and has been authorized for detection and/or diagnosis of SARS-CoV-2 by FDA under an Emergency Use Authorization (EUA). This EUA will remain in effect (meaning this test can be used) for the duration of the COVID-19 declaration under Section 564(b)(1) of the Act, 21 U.S.C. section 360bbb-3(b)(1), unless the authorization is terminated or revoked.  Performed at [facility], [address] [place], [place] [address]   Blood culture (routine single)     Status: None (Preliminary result)   Collection Time: 10/03/20  3:30 PM   Specimen: BLOOD  Result Value Ref Range Status   Specimen Description BLOOD BLOOD LEFT HAND  Final   Special Requests   Final    BOTTLES DRAWN AEROBIC AND ANAEROBIC Blood Culture adequate volume   Culture   Final    NO GROWTH 2 DAYS Performed at [facility], [address], [place], [place] [address]    Report Status PENDING  Incomplete     Discharge Instructions:   Discharge Instructions     Diet Carb Modified   Complete by: As directed    Discharge instructions   Complete by: As directed    Follow-up with your primary care provider in 1 week.  Continue to take medications as prescribed.   Increase activity slowly   Complete by: As directed  Allergies as of 10/05/2020   No Known Allergies      Medication List     STOP taking these medications    baclofen 10 MG tablet Commonly known as: LIORESAL   fenofibrate 145 MG tablet Commonly known as: TRICOR   mirabegron ER 50 MG Tb24 tablet Commonly known as: MYRBETRIQ   neomycin-bacitracin-polymyxin ointment Commonly known as: NEOSPORIN       TAKE these medications    aspirin EC 81 MG tablet Take 1 tablet (81 mg total) by mouth 2 (two) times daily. For DVT prophylaxis for 30 days after surgery.   cetirizine 10 MG tablet Commonly known as:  ZYRTEC Take 10 mg by mouth daily.   cloNIDine 0.3 mg/24hr patch Commonly known as: CATAPRES - Dosed in mg/24 hr Place 1 patch (0.3 mg total) onto the skin once a week. What changed: when to take this   clopidogrel 75 MG tablet Commonly known as: PLAVIX Take 1 tablet (75 mg total) by mouth daily.   escitalopram 10 MG tablet Commonly known as: LEXAPRO Take 1 tablet (10 mg total) by mouth daily.   ferrous sulfate 325 (65 FE) MG tablet Take 325 mg by mouth daily with breakfast.   Fish Oil 1200 MG Caps Take 1,200 mg by mouth daily.   isosorbide mononitrate 30 MG 24 hr tablet Commonly known as: IMDUR Take 30 mg by mouth every evening.   levothyroxine 50 MCG tablet Commonly known as: SYNTHROID Take 1 tablet (50 mcg total) by mouth daily. What changed: when to take this   lisinopril 40 MG tablet Commonly known as: ZESTRIL Take 1 tablet (40 mg total) by mouth daily.   metFORMIN 500 MG tablet Commonly known as: GLUCOPHAGE Take 1 tablet (500 mg total) by mouth 2 (two) times daily. What changed:  how much to take when to take this   metoprolol succinate 25 MG 24 hr tablet Commonly known as: TOPROL-XL Take 25 mg by mouth every evening.   multivitamin tablet Take 1 tablet by mouth daily.   nitroGLYCERIN 0.4 MG SL tablet Commonly known as: NITROSTAT Place 0.4 mg under the tongue every 5 (five) minutes as needed for chest pain.   pravastatin 20 MG tablet Commonly known as: PRAVACHOL Take 20 mg by mouth every evening.   rOPINIRole 0.5 MG tablet Commonly known as: REQUIP Take 2 tablets (1 mg total) by mouth at bedtime.   spironolactone 25 MG tablet Commonly known as: ALDACTONE Take 1 tablet (25 mg total) by mouth daily. What changed: when to take this        Follow-up Information     Sharilyn Sites, MD. Schedule an appointment as soon as possible for a visit in 1 week(s).   Specialty: Family Medicine Why: PATIENT TO MAKE OWN FOLLOW UP APPT DUE OFFICE BEING  CLOSED Contact information: [address] [place] [address] [phone]                  Time coordinating discharge: 39 minutes  Signed:  Jasdeep Dejarnett  [facility] 10/05/2020, 9:30 AM

## 2020-10-05 NOTE — H&P (Deleted)
Physician Discharge Summary  Steve Ramirez JEH:[id number] DOB: 07/30/1935 DOA: 10/03/2020  PCP: Sharilyn Sites, MD  Admit date: 10/03/2020 Discharge date: 10/05/2020  Admitted From: ALF  Discharge disposition: ALF  Recommendations for Outpatient Follow-Up:   Follow up with your primary care provider in one week.  Check CBC, BMP, magnesium in the next visit Please consider 2D echocardiogram in a week. Follow-up with your neurologist as outpatient.  Might need outpatient dementia evaluation  Discharge Diagnosis:   Principal Problem:   TIA (transient ischemic attack) Active Problems:   Diabetes mellitus without complication (HCC)   Hyperlipidemia   History of TIAs   Essential hypertension   CVA (cerebral vascular accident) ([place])   Depression   Hypothyroidism   CAD (coronary artery disease)   Acute metabolic encephalopathy   Discharge Condition: Improved.  Diet recommendation: Low sodium, heart healthy.  Carbohydrate-modified.    Wound care: None.  Code status: Full.   History of Present Illness:   Steve Ramirez is a 85 y.o. male with medical history significant of hypertension, hyperlipidemia, diabetes mellitus, multiple stroke and TIA (3 strokes and 7 TIA per his son), hypothyroidism, depression, diverticular GI bleeding, CAD, syncope, presented to hospital with altered mental status and slurred speech.  Patient also had mild left facial droop which was likely at baseline per his family.  At baseline, he is oriented x3 but in the hospital was little confused.  Patient wife has been admitted to the hospital due to meningitis.  In the ED, patient was noted to be afebrile with no leukocytosis.  Blood pressure was elevated.  CT of the head was negative for acute findings.  Chest x-ray was negative.  Patient was then placed in the hospital for further evaluation and treatment.   Hospital Course:   Following conditions were addressed during hospitalization as  listed below,  TIA  Patient with history of stroke and TIA.  MRI of the brain without any acute findings but chronic infarcts noted. . MRI/MRA showed no acute intracranial hemorrhage or infarct, and stable parenchymal volume loss and advanced chronic white matter microangiopathy, with uchanged focal stenosis.  Neurology was consulted and recommended to continue aspirin, Plavix and statins.  Lipid panel showed LDL of 61.  Hemoglobin A1c pending.  Speech therapy has seen the patient and patient is likely at his baseline but might benefit from outpatient follow-up for dementia evaluation.  2D echocardiogram pending.  Physical therapy and Occupational Therapy has recommended home health on discharge.  Ultrasound of the carotid unremarkable.  Communicated with neurology Lindzen who recommend outpatient echocardiogram.     Diabetes mellitus without complication. Recent hemoglobin A1c of 6.6 patient is taking metformin at home.     Hyperlipidemia Continue statins.   History of multiple TIAs and CVA -On Plavix, aspirin, pravastatin.  Speech and mild facial droop likely at baseline from previous stroke.  Discontinue IV fluids.  MRI did not show any acute stroke   Essential hypertension -Continue home lisinopril, metoprolol.    Hypothyroidism Continue Synthroid.   CAD (coronary artery disease):  Continue aspirin, Plavix, pravastatin   Acute metabolic encephalopathy:  Resolved.  Possibly due to TIA with possible vascular dementia.  Will need outpatient evaluation for dementia cognition. Mentation at baseline at this time.  Disposition.  At this time, patient is stable for disposition home with outpatient PCP and neurology follow-up.  Medical Consultants:   Neurology  Procedures:    None Subjective:   Today, patient was seen and examined at bedside.  Patient denies any dizziness, lightheadedness, nausea, vomiting.  Discharge Exam:   Vitals:   10/05/20 0425 10/05/20 0826  BP: (!) 156/83  (!) 159/96  Pulse: 65 84  Resp: 20 16  Temp: 97.8 F (36.6 C) 98.6 F (37 C)  SpO2: 95% 96%   Vitals:   10/04/20 1949 10/05/20 0017 10/05/20 0425 10/05/20 0826  BP: 138/60 (!) 145/55 (!) 156/83 (!) 159/96  Pulse: 65 63 65 84  Resp: 18 18 20 16   Temp: 98.4 F (36.9 C) 98.2 F (36.8 C) 97.8 F (36.6 C) 98.6 F (37 C)  TempSrc: Oral Oral    SpO2: 97% 96% 95% 96%  Weight:      Height:       General: Alert awake, not in obvious distress HENT: pupils equally reacting to light,  No scleral pallor or icterus noted. Oral mucosa is moist.  Chest:  Clear breath sounds.  Diminished breath sounds bilaterally. No crackles or wheezes.  CVS: S1 &S2 heard. No murmur.  Regular rate and rhythm. Abdomen: Soft, nontender, nondistended.  Bowel sounds are heard.   Extremities: No cyanosis, clubbing or edema.  Peripheral pulses are palpable. Psych: Alert, awake and oriented, normal mood CNS: Flattening of the labial fold.  Moves all extremities.  Speech distinct. Skin: Warm and dry.  No rashes noted.  The results of significant diagnostics from this hospitalization (including imaging, microbiology, ancillary and laboratory) are listed below for reference.     Diagnostic Studies:   CT HEAD WO CONTRAST (5MM)  Result Date: 10/03/2020 CLINICAL DATA:  Mental status change, unknown cause EXAM: CT HEAD WITHOUT CONTRAST TECHNIQUE: Contiguous axial images were obtained from the base of the skull through the vertex without intravenous contrast. COMPARISON:  MRI 08/15/2020.  CT head 05/26/2017. FINDINGS: Brain: No evidence of acute large vascular territory infarction, hemorrhage, hydrocephalus, extra-axial collection or mass lesion/mass effect. Remote infarcts in the left basal ganglia and bilateral thalami. Additional advanced patchy and confluent white matter hypodensity, nonspecific but compatible with chronic microvascular ischemic disease. Partially empty sella. Vascular: No hyperdense vessel identified.  Calcific intracranial atherosclerosis. Skull: No acute fracture. Sinuses/Orbits: Mild paranasal sinus mucosal thickening. Unremarkable visualized orbits. Other: No mastoid effusions. IMPRESSION: 1. No evidence of acute intracranial abnormality. 2. Similar advanced chronic microvascular ischemic disease and remote infarcts, detailed above. Electronically Signed   By: Margaretha Sheffield M.D.   On: 10/03/2020 11:18   MR ANGIO HEAD WO CONTRAST  Result Date: 10/03/2020 CLINICAL DATA:  TIA, slurred speech, expressive aphasia, confusion EXAM: MRI HEAD WITHOUT CONTRAST MRA HEAD WITHOUT CONTRAST TECHNIQUE: Multiplanar, multi-echo pulse sequences of the brain and surrounding structures were acquired without intravenous contrast. Angiographic images of the Circle of Willis were acquired using MRA technique without intravenous contrast. COMPARISON:  Same-day noncontrast CT head, brain MRI 08/15/2020 FINDINGS: MRI HEAD FINDINGS Brain: There is no evidence of acute intracranial hemorrhage, extra-axial fluid collection, or acute infarct. There are remote lacunar infarcts in the bilateral basal ganglia, left more than right. There is moderate parenchymal volume loss with commensurate enlargement of the ventricular system. There is confluent FLAIR signal abnormality throughout the supratentorial white matter likely reflecting sequela of advanced chronic white matter microangiopathy. There is no mass lesion.  There is no midline shift. Vascular: See below Skull and upper cervical spine: Normal marrow signal. Sinuses/Orbits: There is mild mucosal thickening in the maxillary sinuses. The globes and orbits are unremarkable. Other: None. MRA HEAD FINDINGS Anterior circulation: The bilateral intracranial ICAs are patent. The bilateral MCAs and  ACAs are patent. There is no aneurysm. Posterior circulation: The V4 segments of the vertebral arteries are patent, with the left being dominant, unchanged. The basilar artery is patent. There is  focal diminished enhancement of the proximal right P2 segment without evidence of complete occlusion (5-117, coronal MRA reformat key image). This is similar in appearance to the prior study from 08/15/2020. The bilateral PCAs are otherwise patent. There is no aneurysm. Anatomic variants: None. IMPRESSION: 1. No acute intracranial hemorrhage or infarct. 2. Stable parenchymal volume loss and advanced chronic white matter microangiopathy. 3. Unchanged focal stenosis of the right P2 segment. Otherwise, patent intracranial vasculature. Electronically Signed   By: Valetta Mole M.D.   On: 10/03/2020 14:17   MR BRAIN WO CONTRAST  Result Date: 10/03/2020 CLINICAL DATA:  TIA, slurred speech, expressive aphasia, confusion EXAM: MRI HEAD WITHOUT CONTRAST MRA HEAD WITHOUT CONTRAST TECHNIQUE: Multiplanar, multi-echo pulse sequences of the brain and surrounding structures were acquired without intravenous contrast. Angiographic images of the Circle of Willis were acquired using MRA technique without intravenous contrast. COMPARISON:  Same-day noncontrast CT head, brain MRI 08/15/2020 FINDINGS: MRI HEAD FINDINGS Brain: There is no evidence of acute intracranial hemorrhage, extra-axial fluid collection, or acute infarct. There are remote lacunar infarcts in the bilateral basal ganglia, left more than right. There is moderate parenchymal volume loss with commensurate enlargement of the ventricular system. There is confluent FLAIR signal abnormality throughout the supratentorial white matter likely reflecting sequela of advanced chronic white matter microangiopathy. There is no mass lesion.  There is no midline shift. Vascular: See below Skull and upper cervical spine: Normal marrow signal. Sinuses/Orbits: There is mild mucosal thickening in the maxillary sinuses. The globes and orbits are unremarkable. Other: None. MRA HEAD FINDINGS Anterior circulation: The bilateral intracranial ICAs are patent. The bilateral MCAs and ACAs are  patent. There is no aneurysm. Posterior circulation: The V4 segments of the vertebral arteries are patent, with the left being dominant, unchanged. The basilar artery is patent. There is focal diminished enhancement of the proximal right P2 segment without evidence of complete occlusion (5-117, coronal MRA reformat key image). This is similar in appearance to the prior study from 08/15/2020. The bilateral PCAs are otherwise patent. There is no aneurysm. Anatomic variants: None. IMPRESSION: 1. No acute intracranial hemorrhage or infarct. 2. Stable parenchymal volume loss and advanced chronic white matter microangiopathy. 3. Unchanged focal stenosis of the right P2 segment. Otherwise, patent intracranial vasculature. Electronically Signed   By: Valetta Mole M.D.   On: 10/03/2020 14:17   US Carotid Bilateral  Result Date: 10/04/2020 CLINICAL DATA:  86 year old male with a history of stroke EXAM: BILATERAL CAROTID DUPLEX ULTRASOUND TECHNIQUE: Pearline Cables scale imaging, color Doppler and duplex ultrasound were performed of bilateral carotid and vertebral arteries in the neck. COMPARISON:  12/02/2016 FINDINGS: Criteria: Quantification of carotid stenosis is based on velocity parameters that correlate the residual internal carotid diameter with NASCET-based stenosis levels, using the diameter of the distal internal carotid lumen as the denominator for stenosis measurement. The following velocity measurements were obtained: RIGHT ICA:  Systolic 734 cm/sec, Diastolic 21 cm/sec CCA:  193 cm/sec SYSTOLIC ICA/CCA RATIO:  0.5 ECA:  108 cm/sec LEFT ICA:  Systolic 790 cm/sec, Diastolic 21 cm/sec CCA:  84 cm/sec SYSTOLIC ICA/CCA RATIO:  1.2 ECA:  128 cm/sec Right Brachial SBP: Not acquired Left Brachial SBP: Not acquired RIGHT CAROTID ARTERY: No significant calcifications of the right common carotid artery. Intermediate waveform maintained. Moderate heterogeneous and partially calcified plaque at the  right carotid bifurcation. No  significant lumen shadowing. Low resistance waveform of the right ICA. No significant tortuosity. RIGHT VERTEBRAL ARTERY: Antegrade flow with low resistance waveform. LEFT CAROTID ARTERY: No significant calcifications of the left common carotid artery. Intermediate waveform maintained. Moderate heterogeneous and partially calcified plaque at the left carotid bifurcation. No significant lumen shadowing. Low resistance waveform of the left ICA. No significant tortuosity. LEFT VERTEBRAL ARTERY:  Antegrade flow with low resistance waveform. IMPRESSION: Color duplex indicates moderate heterogeneous and calcified plaque, with no hemodynamically significant stenosis by duplex criteria in the extracranial cerebrovascular circulation. Signed, Dulcy Fanny. Dellia Nims, RPVI [facility] Electronically Signed   By: Corrie Mckusick D.O.   On: 10/04/2020 16:17   DG Chest Port 1 View  Result Date: 10/03/2020 CLINICAL DATA:  Questionable sepsis, sudden onset slurred speech and confusion, history of smoking EXAM: PORTABLE CHEST 1 VIEW COMPARISON:  04/15/2020 FINDINGS: The heart size and mediastinal contours are within normal limits. Both lungs are clear. There may be minimal residual scarring of the posterior right upper lobe at the site of previously seen airspace disease. The visualized skeletal structures are unremarkable. IMPRESSION: No acute abnormality of the lungs in AP portable projection. Electronically Signed   By: Eddie Candle M.D.   On: 10/03/2020 10:52     Labs:   Basic Metabolic Panel: Recent Labs  Lab 10/03/20 1000  NA 135  K 4.0  CL 100  CO2 24  GLUCOSE 171*  BUN 14  CREATININE 0.76  CALCIUM 9.6   GFR Estimated Creatinine Clearance: 75.3 mL/min (by C-G formula based on SCr of 0.76 mg/dL). Liver Function Tests: Recent Labs  Lab 10/03/20 1000  AST 16  ALT 13  ALKPHOS 59  BILITOT 0.8  PROT 7.4  ALBUMIN 4.1   No results for input(s):  LIPASE, AMYLASE in the last 168 hours. No results for input(s): AMMONIA in the last 168 hours. Coagulation profile Recent Labs  Lab 10/03/20 1000  INR 1.1    CBC: Recent Labs  Lab 10/03/20 1000  WBC 6.5  NEUTROABS 4.7  HGB 11.3*  HCT 36.9*  MCV 72.4*  PLT 301   Cardiac Enzymes: No results for input(s): CKTOTAL, CKMB, CKMBINDEX, TROPONINI in the last 168 hours. BNP: Invalid input(s): POCBNP CBG: Recent Labs  Lab 10/04/20 0934 10/04/20 1259 10/04/20 1721 10/04/20 2057 10/05/20 0821  GLUCAP 194* 131* 153* 142* 185*   D-Dimer No results for input(s): DDIMER in the last 72 hours. Hgb A1c Recent Labs    10/04/20 0647  HGBA1C 6.6*   Lipid Profile Recent Labs    10/04/20 0647  CHOL 117  HDL 28*  LDLCALC 61  TRIG 140  CHOLHDL 4.2   Thyroid function studies No results for input(s): TSH, T4TOTAL, T3FREE, THYROIDAB in the last 72 hours.  Invalid input(s): FREET3 Anemia work up No results for input(s): VITAMINB12, FOLATE, FERRITIN, TIBC, IRON, RETICCTPCT in the last 72 hours. Microbiology Recent Results (from the past 240 hour(s))  Urine Culture     Status: Abnormal   Collection Time: 10/03/20 11:30 AM   Specimen: In/Out Cath Urine  Result Value Ref Range Status   Specimen Description   Final    IN/OUT CATH URINE Performed at [facility], [address] [place], [place] [address]    Special Requests   Final    NONE Performed at [facility], [address], [place], [place] [address]    Culture MULTIPLE SPECIES PRESENT, SUGGEST RECOLLECTION (  A)  Final   Report Status 10/04/2020 FINAL  Final  Resp Panel by RT-PCR (Flu A&B, Covid) Nasopharyngeal Swab     Status: None   Collection Time: 10/03/20  2:30 PM   Specimen: Nasopharyngeal Swab; Nasopharyngeal(NP) swabs in vial transport medium  Result Value Ref Range Status   SARS Coronavirus 2 by RT PCR NEGATIVE NEGATIVE Final    Comment: (NOTE) SARS-CoV-2 target nucleic acids are NOT  DETECTED.  The SARS-CoV-2 RNA is generally detectable in upper respiratory specimens during the acute phase of infection. The lowest concentration of SARS-CoV-2 viral copies this assay can detect is 138 copies/mL. A negative result does not preclude SARS-Cov-2 infection and should not be used as the sole basis for treatment or other patient management decisions. A negative result may occur with  improper specimen collection/handling, submission of specimen other than nasopharyngeal swab, presence of viral mutation(s) within the areas targeted by this assay, and inadequate number of viral copies(<138 copies/mL). A negative result must be combined with clinical observations, patient history, and epidemiological information. The expected result is Negative.  Fact Sheet for Patients:  EntrepreneurPulse.com.au  Fact Sheet for Healthcare Providers:  IncredibleEmployment.be  This test is no t yet approved or cleared by the [place] FDA and  has been authorized for detection and/or diagnosis of SARS-CoV-2 by FDA under an Emergency Use Authorization (EUA). This EUA will remain  in effect (meaning this test can be used) for the duration of the COVID-19 declaration under Section 564(b)(1) of the Act, 21 U.S.C.section 360bbb-3(b)(1), unless the authorization is terminated  or revoked sooner.       Influenza A by PCR NEGATIVE NEGATIVE Final   Influenza B by PCR NEGATIVE NEGATIVE Final    Comment: (NOTE) The Xpert Xpress SARS-CoV-2/FLU/RSV plus assay is intended as an aid in the diagnosis of influenza from Nasopharyngeal swab specimens and should not be used as a sole basis for treatment. Nasal washings and aspirates are unacceptable for Xpert Xpress SARS-CoV-2/FLU/RSV testing.  Fact Sheet for Patients: EntrepreneurPulse.com.au  Fact Sheet for Healthcare Providers: IncredibleEmployment.be  This test is not yet  approved or cleared by the [place] FDA and has been authorized for detection and/or diagnosis of SARS-CoV-2 by FDA under an Emergency Use Authorization (EUA). This EUA will remain in effect (meaning this test can be used) for the duration of the COVID-19 declaration under Section 564(b)(1) of the Act, 21 U.S.C. section 360bbb-3(b)(1), unless the authorization is terminated or revoked.  Performed at [facility], [address] [place], [place] [address]   Blood culture (routine single)     Status: None (Preliminary result)   Collection Time: 10/03/20  3:30 PM   Specimen: BLOOD  Result Value Ref Range Status   Specimen Description BLOOD BLOOD LEFT HAND  Final   Special Requests   Final    BOTTLES DRAWN AEROBIC AND ANAEROBIC Blood Culture adequate volume   Culture   Final    NO GROWTH 2 DAYS Performed at [facility], [address], [place], [place] [address]    Report Status PENDING  Incomplete     Discharge Instructions:   Discharge Instructions     Diet Carb Modified   Complete by: As directed    Discharge instructions   Complete by: As directed    Follow-up with your primary care provider in 1 week.  Continue to take medications as prescribed.   Increase activity slowly   Complete by: As directed  Allergies as of 10/05/2020   No Known Allergies      Medication List     STOP taking these medications    baclofen 10 MG tablet Commonly known as: LIORESAL   fenofibrate 145 MG tablet Commonly known as: TRICOR   mirabegron ER 50 MG Tb24 tablet Commonly known as: MYRBETRIQ   neomycin-bacitracin-polymyxin ointment Commonly known as: NEOSPORIN       TAKE these medications    aspirin EC 81 MG tablet Take 1 tablet (81 mg total) by mouth 2 (two) times daily. For DVT prophylaxis for 30 days after surgery.   cetirizine 10 MG tablet Commonly known as: ZYRTEC Take 10 mg by mouth daily.   cloNIDine 0.3 mg/24hr patch Commonly  known as: CATAPRES - Dosed in mg/24 hr Place 1 patch (0.3 mg total) onto the skin once a week. What changed: when to take this   clopidogrel 75 MG tablet Commonly known as: PLAVIX Take 1 tablet (75 mg total) by mouth daily.   escitalopram 10 MG tablet Commonly known as: LEXAPRO Take 1 tablet (10 mg total) by mouth daily.   ferrous sulfate 325 (65 FE) MG tablet Take 325 mg by mouth daily with breakfast.   Fish Oil 1200 MG Caps Take 1,200 mg by mouth daily.   isosorbide mononitrate 30 MG 24 hr tablet Commonly known as: IMDUR Take 30 mg by mouth every evening.   levothyroxine 50 MCG tablet Commonly known as: SYNTHROID Take 1 tablet (50 mcg total) by mouth daily. What changed: when to take this   lisinopril 40 MG tablet Commonly known as: ZESTRIL Take 1 tablet (40 mg total) by mouth daily.   metFORMIN 500 MG tablet Commonly known as: GLUCOPHAGE Take 1 tablet (500 mg total) by mouth 2 (two) times daily. What changed:  how much to take when to take this   metoprolol succinate 25 MG 24 hr tablet Commonly known as: TOPROL-XL Take 25 mg by mouth every evening.   multivitamin tablet Take 1 tablet by mouth daily.   nitroGLYCERIN 0.4 MG SL tablet Commonly known as: NITROSTAT Place 0.4 mg under the tongue every 5 (five) minutes as needed for chest pain.   pravastatin 20 MG tablet Commonly known as: PRAVACHOL Take 20 mg by mouth every evening.   rOPINIRole 0.5 MG tablet Commonly known as: REQUIP Take 2 tablets (1 mg total) by mouth at bedtime.   spironolactone 25 MG tablet Commonly known as: ALDACTONE Take 1 tablet (25 mg total) by mouth daily. What changed: when to take this        Follow-up Information     Sharilyn Sites, MD. Schedule an appointment as soon as possible for a visit in 1 week(s).   Specialty: Family Medicine Contact information: [address] [place] [address] [phone]                  Time coordinating discharge: 39  minutes  Signed:  Sion Reinders  [facility] 10/05/2020, 9:16 AM

## 2020-10-05 NOTE — Progress Notes (Signed)
Pt being discharged to [facility], discharge instructions reviewed with pt and daughter, daughter to transport pt, pt with no complaints

## 2020-10-08 LAB — CULTURE, BLOOD (SINGLE)
Culture: NO GROWTH
Special Requests: ADEQUATE

## 2020-10-18 ENCOUNTER — Other Ambulatory Visit: Payer: Self-pay

## 2020-10-24 ENCOUNTER — Other Ambulatory Visit: Payer: Self-pay

## 2020-10-24 ENCOUNTER — Ambulatory Visit (INDEPENDENT_AMBULATORY_CARE_PROVIDER_SITE_OTHER): Payer: Medicare Other

## 2020-10-24 DIAGNOSIS — G459 Transient cerebral ischemic attack, unspecified: Secondary | ICD-10-CM | POA: Diagnosis not present

## 2020-10-24 NOTE — Progress Notes (Unsigned)
IV  

## 2020-10-25 LAB — ECHOCARDIOGRAM COMPLETE BUBBLE STUDY
AR max vel: 1.86 cm2
AV Area VTI: 1.88 cm2
AV Area mean vel: 1.87 cm2
AV Mean grad: 5 mmHg
AV Peak grad: 9.6 mmHg
Ao pk vel: 1.55 m/s
Area-P 1/2: 3.65 cm2
Calc EF: 39.8 %
S' Lateral: 4.4 cm
Single Plane A2C EF: 33.5 %
Single Plane A4C EF: 43.3 %

## 2020-10-28 ENCOUNTER — Telehealth: Payer: Self-pay

## 2020-10-28 NOTE — Telephone Encounter (Signed)
Contacted pt, LVM per DPR, informing him his echo was unremarkable with no major findings. Advised to continue current medications and plan, and to call the office back with questions

## 2020-10-28 NOTE — Telephone Encounter (Signed)
-----   Message from Penni Bombard, MD sent at 10/28/2020  3:31 PM EDT ----- Unremarkable study. No major findings. Please call patient. Continue current plan. -VRP

## 2020-11-03 ENCOUNTER — Inpatient Hospital Stay
Admission: EM | Admit: 2020-11-03 | Discharge: 2020-11-05 | DRG: 069 | Disposition: A | Discharge status: Skilled Nursing Facility | Payer: Medicare Other | Attending: Internal Medicine | Admitting: Internal Medicine

## 2020-11-03 ENCOUNTER — Encounter: Payer: Self-pay | Admitting: Emergency Medicine

## 2020-11-03 ENCOUNTER — Emergency Department: Payer: Medicare Other

## 2020-11-03 ENCOUNTER — Other Ambulatory Visit: Payer: Self-pay

## 2020-11-03 DIAGNOSIS — Z981 Arthrodesis status: Secondary | ICD-10-CM

## 2020-11-03 DIAGNOSIS — G459 Transient cerebral ischemic attack, unspecified: Principal | ICD-10-CM | POA: Diagnosis present

## 2020-11-03 DIAGNOSIS — R4182 Altered mental status, unspecified: Secondary | ICD-10-CM

## 2020-11-03 DIAGNOSIS — E119 Type 2 diabetes mellitus without complications: Secondary | ICD-10-CM

## 2020-11-03 DIAGNOSIS — Z808 Family history of malignant neoplasm of other organs or systems: Secondary | ICD-10-CM

## 2020-11-03 DIAGNOSIS — Z87891 Personal history of nicotine dependence: Secondary | ICD-10-CM

## 2020-11-03 DIAGNOSIS — Z8249 Family history of ischemic heart disease and other diseases of the circulatory system: Secondary | ICD-10-CM

## 2020-11-03 DIAGNOSIS — R531 Weakness: Secondary | ICD-10-CM | POA: Diagnosis present

## 2020-11-03 DIAGNOSIS — R4701 Aphasia: Secondary | ICD-10-CM | POA: Diagnosis present

## 2020-11-03 DIAGNOSIS — Z20822 Contact with and (suspected) exposure to covid-19: Secondary | ICD-10-CM | POA: Diagnosis present

## 2020-11-03 DIAGNOSIS — I672 Cerebral atherosclerosis: Secondary | ICD-10-CM | POA: Diagnosis present

## 2020-11-03 DIAGNOSIS — I2510 Atherosclerotic heart disease of native coronary artery without angina pectoris: Secondary | ICD-10-CM | POA: Diagnosis present

## 2020-11-03 DIAGNOSIS — Z85820 Personal history of malignant melanoma of skin: Secondary | ICD-10-CM

## 2020-11-03 DIAGNOSIS — G9341 Metabolic encephalopathy: Secondary | ICD-10-CM | POA: Diagnosis present

## 2020-11-03 DIAGNOSIS — E785 Hyperlipidemia, unspecified: Secondary | ICD-10-CM | POA: Diagnosis present

## 2020-11-03 DIAGNOSIS — R0602 Shortness of breath: Secondary | ICD-10-CM

## 2020-11-03 DIAGNOSIS — I10 Essential (primary) hypertension: Secondary | ICD-10-CM | POA: Diagnosis present

## 2020-11-03 DIAGNOSIS — Z8673 Personal history of transient ischemic attack (TIA), and cerebral infarction without residual deficits: Secondary | ICD-10-CM

## 2020-11-03 DIAGNOSIS — Z7984 Long term (current) use of oral hypoglycemic drugs: Secondary | ICD-10-CM

## 2020-11-03 DIAGNOSIS — Z79899 Other long term (current) drug therapy: Secondary | ICD-10-CM

## 2020-11-03 DIAGNOSIS — R2990 Unspecified symptoms and signs involving the nervous system: Secondary | ICD-10-CM | POA: Diagnosis not present

## 2020-11-03 DIAGNOSIS — Z7982 Long term (current) use of aspirin: Secondary | ICD-10-CM

## 2020-11-03 DIAGNOSIS — Z7902 Long term (current) use of antithrombotics/antiplatelets: Secondary | ICD-10-CM

## 2020-11-03 DIAGNOSIS — Z825 Family history of asthma and other chronic lower respiratory diseases: Secondary | ICD-10-CM

## 2020-11-03 DIAGNOSIS — Z96643 Presence of artificial hip joint, bilateral: Secondary | ICD-10-CM | POA: Diagnosis present

## 2020-11-03 DIAGNOSIS — E039 Hypothyroidism, unspecified: Secondary | ICD-10-CM | POA: Diagnosis present

## 2020-11-03 DIAGNOSIS — Z79890 Hormone replacement therapy (postmenopausal): Secondary | ICD-10-CM

## 2020-11-03 DIAGNOSIS — R29810 Facial weakness: Secondary | ICD-10-CM | POA: Diagnosis present

## 2020-11-03 LAB — PROTIME-INR
INR: 1 (ref 0.8–1.2)
Prothrombin Time: 13.4 seconds (ref 11.4–15.2)

## 2020-11-03 LAB — TROPONIN I (HIGH SENSITIVITY)
Troponin I (High Sensitivity): 15 ng/L (ref <18)
Troponin I (High Sensitivity): 17 ng/L (ref <18)

## 2020-11-03 LAB — APTT: aPTT: 30 seconds (ref 24–36)

## 2020-11-03 LAB — COMPREHENSIVE METABOLIC PANEL
ALT: 16 U/L (ref 0–44)
AST: 16 U/L (ref 15–41)
Albumin: 4.7 g/dL (ref 3.5–5.0)
Alkaline Phosphatase: 70 U/L (ref 38–126)
Anion gap: 15 (ref 5–15)
BUN: 15 mg/dL (ref 8–23)
CO2: 24 mmol/L (ref 22–32)
Calcium: 9.2 mg/dL (ref 8.9–10.3)
Chloride: 100 mmol/L (ref 98–111)
Creatinine, Ser: 0.87 mg/dL (ref 0.61–1.24)
GFR, Estimated: >60 mL/min (ref >60)
Glucose, Bld: 132 mg/dL — ABNORMAL HIGH (ref 70–99)
Potassium: 3.7 mmol/L (ref 3.5–5.1)
Sodium: 139 mmol/L (ref 135–145)
Total Bilirubin: 0.6 mg/dL (ref 0.3–1.2)
Total Protein: 8 g/dL (ref 6.5–8.1)

## 2020-11-03 LAB — CBC
HCT: 41 % (ref 39.0–52.0)
HCT: 39.9 % (ref 39.0–52.0)
Hemoglobin: 12.6 g/dL — ABNORMAL LOW (ref 13.0–17.0)
Hemoglobin: 12.5 g/dL — ABNORMAL LOW (ref 13.0–17.0)
MCH: 22.5 pg — ABNORMAL LOW (ref 26.0–34.0)
MCH: 22.5 pg — ABNORMAL LOW (ref 26.0–34.0)
MCHC: 30.7 g/dL (ref 30.0–36.0)
MCHC: 31.3 g/dL (ref 30.0–36.0)
MCV: 73.1 fL — ABNORMAL LOW (ref 80.0–100.0)
MCV: 71.9 fL — ABNORMAL LOW (ref 80.0–100.0)
Platelets: 321 10*3/uL (ref 150–400)
Platelets: 288 10*3/uL (ref 150–400)
RBC: 5.61 MIL/uL (ref 4.22–5.81)
RBC: 5.55 MIL/uL (ref 4.22–5.81)
RDW: 19.2 % — ABNORMAL HIGH (ref 11.5–15.5)
RDW: 19.4 % — ABNORMAL HIGH (ref 11.5–15.5)
WBC: 7.3 10*3/uL (ref 4.0–10.5)
WBC: 7.1 10*3/uL (ref 4.0–10.5)
nRBC: 0 % (ref 0.0–0.2)
nRBC: 0 % (ref 0.0–0.2)

## 2020-11-03 LAB — DIFFERENTIAL
Abs Immature Granulocytes: 0.01 10*3/uL (ref 0.00–0.07)
Basophils Absolute: 0.1 10*3/uL (ref 0.0–0.1)
Basophils Relative: 1 %
Eosinophils Absolute: 0.5 10*3/uL (ref 0.0–0.5)
Eosinophils Relative: 6 %
Immature Granulocytes: 0 %
Lymphocytes Relative: 31 %
Lymphs Abs: 2.3 10*3/uL (ref 0.7–4.0)
Monocytes Absolute: 0.7 10*3/uL (ref 0.1–1.0)
Monocytes Relative: 9 %
Neutro Abs: 3.8 10*3/uL (ref 1.7–7.7)
Neutrophils Relative %: 53 %

## 2020-11-03 LAB — CBG MONITORING, ED: Glucose-Capillary: 124 mg/dL — ABNORMAL HIGH (ref 70–99)

## 2020-11-03 LAB — CREATININE, SERUM
Creatinine, Ser: 0.77 mg/dL (ref 0.61–1.24)
GFR, Estimated: >60 mL/min (ref >60)

## 2020-11-03 LAB — RESP PANEL BY RT-PCR (FLU A&B, COVID) ARPGX2
Influenza A by PCR: NEGATIVE
Influenza B by PCR: NEGATIVE
SARS Coronavirus 2 by RT PCR: NEGATIVE

## 2020-11-03 LAB — GLUCOSE, CAPILLARY: Glucose-Capillary: 123 mg/dL — ABNORMAL HIGH (ref 70–99)

## 2020-11-03 MED ORDER — STROKE: EARLY STAGES OF RECOVERY BOOK: Freq: Once | Status: AC

## 2020-11-03 MED ORDER — ASPIRIN EC 81 MG PO TBEC: 81.0000 mg | DELAYED_RELEASE_TABLET | Freq: Two times a day (BID) | ORAL | Status: DC

## 2020-11-03 MED ORDER — ENOXAPARIN SODIUM 40 MG/0.4ML IJ SOSY
40.0000 mg | PREFILLED_SYRINGE | INTRAMUSCULAR | Status: DC
  Administered 2020-11-03 – 2020-11-04 (×2): 40 mg via SUBCUTANEOUS
  Filled 2020-11-03 (×2): qty 0.4

## 2020-11-03 MED ORDER — ADULT MULTIVITAMIN W/MINERALS CH
1.0000 | ORAL_TABLET | Freq: Every day | ORAL | Status: DC
  Administered 2020-11-04 – 2020-11-05 (×2): 1 via ORAL
  Filled 2020-11-03 (×2): qty 1

## 2020-11-03 MED ORDER — ROPINIROLE HCL 1 MG PO TABS
1.0000 mg | ORAL_TABLET | Freq: Every day | ORAL | Status: DC
  Administered 2020-11-04: 1 mg via ORAL
  Filled 2020-11-03: qty 1

## 2020-11-03 MED ORDER — ASPIRIN EC 81 MG PO TBEC: 81.0000 mg | DELAYED_RELEASE_TABLET | Freq: Every day | ORAL | Status: DC

## 2020-11-03 MED ORDER — SODIUM CHLORIDE 0.9 % IV SOLN: INTRAVENOUS | Status: DC

## 2020-11-03 MED ORDER — SODIUM CHLORIDE 0.9% FLUSH: 3.0000 mL | Freq: Once | INTRAVENOUS | Status: DC

## 2020-11-03 MED ORDER — QUETIAPINE FUMARATE 25 MG PO TABS
25.0000 mg | ORAL_TABLET | Freq: Every day | ORAL | Status: AC
  Administered 2020-11-03: 23:00:00 25 mg via ORAL
  Filled 2020-11-03: qty 1

## 2020-11-03 MED ORDER — PRAVASTATIN SODIUM 20 MG PO TABS
20.0000 mg | ORAL_TABLET | Freq: Every evening | ORAL | Status: DC
  Administered 2020-11-04: 18:00:00 20 mg via ORAL
  Filled 2020-11-03: qty 1

## 2020-11-03 MED ORDER — LORAZEPAM 2 MG/ML IJ SOLN
INTRAMUSCULAR | Status: AC
  Administered 2020-11-03: 1 mg
  Filled 2020-11-03: qty 1

## 2020-11-03 MED ORDER — CLOPIDOGREL BISULFATE 75 MG PO TABS: 75.0000 mg | ORAL_TABLET | Freq: Every day | ORAL | Status: DC

## 2020-11-03 MED ORDER — NITROGLYCERIN 0.4 MG SL SUBL: 0.4000 mg | SUBLINGUAL_TABLET | SUBLINGUAL | Status: DC | PRN

## 2020-11-03 MED ORDER — SPIRONOLACTONE 25 MG PO TABS
25.0000 mg | ORAL_TABLET | ORAL | Status: DC
  Administered 2020-11-04: 18:00:00 25 mg via ORAL
  Filled 2020-11-03: qty 1

## 2020-11-03 MED ORDER — METOPROLOL SUCCINATE ER 25 MG PO TB24
25.0000 mg | ORAL_TABLET | Freq: Every day | ORAL | Status: DC
  Administered 2020-11-04: 13:00:00 25 mg via ORAL
  Filled 2020-11-03: qty 1

## 2020-11-03 MED ORDER — CLONIDINE HCL 0.3 MG/24HR TD PTWK: 0.3000 mg | MEDICATED_PATCH | TRANSDERMAL | Status: DC

## 2020-11-03 MED ORDER — CLOPIDOGREL BISULFATE 75 MG PO TABS
75.0000 mg | ORAL_TABLET | Freq: Every day | ORAL | Status: DC
  Administered 2020-11-04 – 2020-11-05 (×2): 75 mg via ORAL
  Filled 2020-11-03 (×2): qty 1

## 2020-11-03 MED ORDER — LORATADINE 10 MG PO TABS
10.0000 mg | ORAL_TABLET | Freq: Every day | ORAL | Status: DC
  Administered 2020-11-04 – 2020-11-05 (×2): 10 mg via ORAL
  Filled 2020-11-03: qty 1

## 2020-11-03 MED ORDER — ESCITALOPRAM OXALATE 20 MG PO TABS
20.0000 mg | ORAL_TABLET | Freq: Every day | ORAL | Status: DC
  Administered 2020-11-04 – 2020-11-05 (×2): 20 mg via ORAL
  Filled 2020-11-03 (×2): qty 1

## 2020-11-03 MED ORDER — ACETAMINOPHEN 650 MG RE SUPP: 650.0000 mg | RECTAL | Status: DC | PRN

## 2020-11-03 MED ORDER — FERROUS SULFATE 325 (65 FE) MG PO TABS
325.0000 mg | ORAL_TABLET | Freq: Every day | ORAL | Status: DC
  Administered 2020-11-04 – 2020-11-05 (×2): 325 mg via ORAL
  Filled 2020-11-03 (×2): qty 1

## 2020-11-03 MED ORDER — ACETAMINOPHEN 160 MG/5ML PO SOLN
650.0000 mg | ORAL | Status: DC | PRN
  Filled 2020-11-03: qty 20.3

## 2020-11-03 MED ORDER — ACETAMINOPHEN 325 MG PO TABS: 650.0000 mg | ORAL_TABLET | ORAL | Status: DC | PRN

## 2020-11-03 MED ORDER — INSULIN ASPART 100 UNIT/ML IJ SOLN: 0.0000 [IU] | Freq: Every day | INTRAMUSCULAR | Status: DC

## 2020-11-03 MED ORDER — ESCITALOPRAM OXALATE 10 MG PO TABS: 10.0000 mg | ORAL_TABLET | Freq: Every day | ORAL | Status: DC

## 2020-11-03 MED ORDER — ISOSORBIDE MONONITRATE ER 30 MG PO TB24
30.0000 mg | ORAL_TABLET | Freq: Every evening | ORAL | Status: DC
  Administered 2020-11-04: 18:00:00 30 mg via ORAL
  Filled 2020-11-03: qty 1

## 2020-11-03 MED ORDER — SENNOSIDES-DOCUSATE SODIUM 8.6-50 MG PO TABS: 1.0000 | ORAL_TABLET | Freq: Every evening | ORAL | Status: DC | PRN

## 2020-11-03 MED ORDER — INSULIN ASPART 100 UNIT/ML IJ SOLN
0.0000 [IU] | Freq: Three times a day (TID) | INTRAMUSCULAR | Status: DC
  Administered 2020-11-04: 18:00:00 3 [IU] via SUBCUTANEOUS
  Administered 2020-11-04 (×2): 2 [IU] via SUBCUTANEOUS
  Filled 2020-11-03 (×3): qty 1

## 2020-11-03 MED ORDER — OMEGA-3-ACID ETHYL ESTERS 1 G PO CAPS
1.0000 g | ORAL_CAPSULE | Freq: Two times a day (BID) | ORAL | Status: DC
  Administered 2020-11-04 – 2020-11-05 (×3): 1 g via ORAL
  Filled 2020-11-03 (×3): qty 1

## 2020-11-03 MED ORDER — LEVOTHYROXINE SODIUM 50 MCG PO TABS
50.0000 ug | ORAL_TABLET | Freq: Every day | ORAL | Status: DC
  Administered 2020-11-04 – 2020-11-05 (×2): 50 ug via ORAL
  Filled 2020-11-03 (×2): qty 1

## 2020-11-03 MED ORDER — LISINOPRIL 20 MG PO TABS
40.0000 mg | ORAL_TABLET | Freq: Every day | ORAL | Status: DC
  Administered 2020-11-04: 40 mg via ORAL
  Filled 2020-11-03: qty 2

## 2020-11-03 NOTE — Consult Note (Signed)
Neurology Consultation Reason for Consult: Aphasia, questionable left facial droop  Requesting Physician: Marjean Donna  CC: Speech arrest  History is obtained from: Daughter-in-law, son at bedside and chart review   HPI: Steve Ramirez is a 85 y.o. male with  a past medical history significant for coronary artery disease, hypertension, hyperlipidemia, diabetes, prior strokes with residual agraphia per chart review, prior confusional episodes, colonic diverticular bleed, melanoma.  He has now had 7-8 episodes of TIA versus stroke characterized by difficulty speaking typically associated with agitation, the first being in 2016.  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  At baseline he is functional and helps care for his wife in a senior living center in which they live together, and as of August 2022 at least he was still driving.  He has only had 1 prior EEG (2016) which was negative as far as I can determine from his chart, corroborated by son at bedside.  His family notes he has seemed more tired for the past 1 week, and that the last spoke to him at 36 PM on 10/22.  When daughter-in-law was visiting him at 2:30 PM she found that he was little slower to respond than normal and just seemed a little bit "off" but still had overall sensible speech.  However he been stopped responding to her at all at around 3:15 PM which resulted in her bringing him to the ED for further evaluation.  She also noted a left facial droop.  He was last evaluated by inpatient neurology on 10/03/2020 for similar symptoms with blood pressure noted to be 200s/100s, as well as 08/20/2020.  Family reported that they felt he was treated with TNK during his last presentation but I cannot find documentation supporting this.  Family notes his episodes take anywhere from 30 minutes to 2 to 3 days to resolve, with the last  episode taking 2 to 3 days to resolve.  Interestingly some of these episodes have preceded strokes but other episodes have had entirely negative work-up  LKW: 10/22 noon tPA given?: No, MRI negative for acute stroke and out of the window based on history Premorbid modified rankin scale:      1 - No significant disability. Able to carry out all usual activities, despite some symptoms.  ROS: Unable to obtain due to altered mental status, family reports no new symptoms other than some fatigue for the past 1 week and perhaps some shortness of breath for the last few days  Past Medical History:  Diagnosis Date   Coronary atherosclerosis of native coronary artery    Essential hypertension, benign    GIB (gastrointestinal bleeding)    Colonic diverticular bleed   History of melanoma    History of TIAs    Stroke [facility])    Type 2 diabetes mellitus ([place])    Past Surgical History:  Procedure Laterality Date   COLONOSCOPY  08/13/2010   Procedure: COLONOSCOPY;  Surgeon: Rogene Houston, MD;  Location: [facility] ENDO SUITE;  Service: Endoscopy;  Laterality: N/A;   JOINT REPLACEMENT     KNEE SURGERY     arthroscopic   LEFT HEART CATHETERIZATION WITH CORONARY ANGIOGRAM N/A 11/24/2010   Procedure: LEFT HEART CATHETERIZATION WITH CORONARY ANGIOGRAM;  Surgeon: Josue Hector, MD;  Location: [facility] CATH LAB;  Service: Cardiovascular;  Laterality: N/A;   neck fusion     years ago  TOTAL HIP ARTHROPLASTY Right 08/02/2018   Procedure: TOTAL HIP ARTHROPLASTY ANTERIOR APPROACH;  Surgeon: Renette Butters, MD;  Location: [facility] ORS;  Service: Orthopedics;  Laterality: Right;   TOTAL HIP ARTHROPLASTY Left 10/18/2018   Procedure: TOTAL HIP ARTHROPLASTY ANTERIOR APPROACH;  Surgeon: Renette Butters, MD;  Location: [facility] ORS;  Service: Orthopedics;  Laterality: Left;   Current Outpatient Medications  Medication Instructions   aspirin EC 81 mg, Oral, 2 times daily, For DVT prophylaxis for 30 days after surgery.   cetirizine  (ZYRTEC) 10 mg, Daily   cloNIDine (CATAPRES - DOSED IN MG/24 HR) 0.3 mg, Transdermal, Weekly   clopidogrel (PLAVIX) 75 mg, Oral, Daily   escitalopram (LEXAPRO) 10 mg, Oral, Daily   escitalopram (LEXAPRO) 20 mg, Oral, Daily   ferrous sulfate 325 mg, Oral, Daily with breakfast   Fish Oil 1,200 mg, Oral, Daily   isosorbide mononitrate (IMDUR) 30 mg, Oral, Every evening   levothyroxine (SYNTHROID) 50 mcg, Oral, Daily   lisinopril (ZESTRIL) 40 mg, Oral, Daily   metFORMIN (GLUCOPHAGE) 500 mg, Oral, 2 times daily   metFORMIN (GLUCOPHAGE) 1,000 mg, Oral, 2 times daily   metoprolol succinate (TOPROL-XL) 25 mg, Oral, Every evening   Multiple Vitamin (MULTIVITAMIN) tablet 1 tablet, Daily   nitroGLYCERIN (NITROSTAT) 0.4 mg, Every 5 min PRN   pravastatin (PRAVACHOL) 20 mg, Oral, Every evening   rOPINIRole (REQUIP) 1 mg, Oral, Daily at bedtime   spironolactone (ALDACTONE) 25 mg, Oral, Daily     Family History  Problem Relation Age of Onset   Coronary artery disease Father    Asthma Other    Emphysema Mother    Coronary artery disease Paternal Uncle    Brain cancer Brother    Social History:  reports that he quit smoking about 40 years ago. His smoking use included cigarettes. He has never used smokeless tobacco. He reports current alcohol use. He reports that he does not use drugs.   Exam: Current vital signs: BP (!) 188/107 (BP Location: Left Arm)   Pulse 90   Temp 97.7 F (36.5 C) (Oral)   Resp 20   Ht 5\' 8"  (1.727 m)   Wt 85 kg   SpO2 100%   BMI 28.49 kg/m  Vital signs in last 24 hours: Temp:  [97.7 F (36.5 C)] 97.7 F (36.5 C) (10/23 1550) Pulse Rate:  [70-90] 90 (10/23 1610) Resp:  [20] 20 (10/23 1550) BP: (188)/(107) 188/107 (10/23 1550) SpO2:  [95 %-100 %] 100 % (10/23 1610) Weight:  [85 kg] 85 kg (10/23 1549)   Physical Exam  Constitutional: Appears well-developed and well-nourished.  Psych: Affect irritable but redirectable Eyes: No scleral injection HENT: No  oropharyngeal obstruction.  MSK: no acute joint deformities.  Cardiovascular: Normal rate and regular rhythm.  Respiratory: Effort normal, non-labored breathing GI: Soft.  No distension. There is no tenderness.  Skin: Minor rashes on the bilateral lower shins which family states they have not noticed before  Neuro: Mental Status: Awake, alert, follows some commands but poor attention/concentration.  Intermittently able to express simple thoughts such as wanting to leave the hospital and wanting to get out of bed, but other statements appear to be word salad.  No evidence of neglect, though cannot test for subtle signs given his mental status.  Does not participate in naming/repetition testing Cranial Nerves: II: Visual Fields are full to orienting to stimuli. Pupils are equal, round, and reactive to light.   III,IV, VI: EOMI without ptosis or diploplia.  V: Facial  sensation is symmetric to eyelash brush VII: Facial movement is symmetric.  VIII: hearing is intact to voice Unable to assess secondary to patient's mental status  Motor: He does not participate in confrontational strength testing but is using all 4 extremities grossly equally to attempt to get out of bed. Sensory: Equally reactive to touch in all 4 extremities Deep Tendon Reflexes: To restless for reflex testing Plantars: Withdraws feet bilaterally Cerebellar: Does not participate in testing  NIHSS total 10 Score breakdown: 2 points for not answering age or month, 2 points for not following commands, 2 points for left leg weakness, 2 points for right leg weakness, 2 points for severe aphasia    I have reviewed labs in epic and the results pertinent to this consultation are:  Basic Metabolic Panel: Recent Labs  Lab 11/03/20 1540  NA 139  K 3.7  CL 100  CO2 24  GLUCOSE 132*  BUN 15  CREATININE 0.87  CALCIUM 9.2    CBC: Recent Labs  Lab 11/03/20 1540  WBC 7.3  NEUTROABS 3.8  HGB 12.6*  HCT 41.0  MCV  73.1*  PLT 321    Coagulation Studies: Recent Labs    11/03/20 1540  LABPROT 13.4  INR 1.0    Lab Results  Component Value Date   CHOL 117 10/04/2020   HDL 28 (L) 10/04/2020   LDLCALC 61 10/04/2020   TRIG 140 10/04/2020   CHOLHDL 4.2 10/04/2020   Lab Results  Component Value Date   HGBA1C 6.6 (H) 10/04/2020      Lab Results  Component Value Date   VITAMINB12 468 05/07/2017    Lab Results  Component Value Date   TSH 2.33 05/07/2017   I have reviewed the images obtained:  ECHO 10/24/2020  1. Left ventricular ejection fraction, by estimation, is 50 to 55%. The  left ventricle has low normal function. Septal wall hypokinesis, possibly  secondary to conduction abnormality.   2. There is mild left ventricular hypertrophy. Left ventricular diastolic  parameters are consistent with Grade II diastolic dysfunction  (pseudonormalization).   3. Right ventricular systolic function is normal. The right ventricular  size is normal.   4. Left atrial size was moderately dilated.   5. The mitral valve is normal in structure. Mild mitral valve  regurgitation.   6. The aortic valve is normal in structure. Aortic valve regurgitation is  trivial. Mild aortic valve sclerosis is present, with no evidence of  aortic valve stenosis.   7. There is borderline dilatation of the ascending aorta, measuring 37  mm.   8. Agitated saline contrast bubble study was negative, with no evidence  of any interatrial shunt.   Impression: Recurrent confusional episode, without associated MRI changes.  Examination fairly nonfocal with agitation/inattention/possible aphasia versus delirium; no facial droop at the time of my evaluation, which daughter-in-law at bedside confirms has improved.  He has had extensive work-up for these events in the past, particularly stroke work-up.  The recurrent nature of the events makes a question the possibility of focal seizure, for which I will repeat EEG.  He does have  a known left MCA stenosis as well, which could correlate to his language difficulty, though notably his daughter-in-law repeatedly notes a left facial droop which would not correlate.  Hypertensive encephalopathy is a possibility as well, but I hesitate to lower his blood pressure too acutely in the setting of his known severe intracranial atherosclerotic disease  Recommendations: - Admit for Observation - UA, UDS, Ammonia,  HIV, RPR, B12, MMA, thiamine, TSH - Chest x-ray given family reporting shortness of breath - Routine EEG  - Permissive hypertension to 180/100 overnight given focal left MCA stenosis but no evidence of stroke on MRI (standard 220/120 goal additionally felt to be too high given possibility of hypertensive encephalopathy) - Per outpatient neurologist long-term BP goal 120-140 given his severe intracranial atherosclerosis and near syncope in the past - No need to repeat stroke work-up given this was just completed 1 month ago (including echocardiogram completed within the last week) and MRI is negative for stroke - Continue home dual antiplatelet therapy and pravastatin 20 mg - Seroquel 25 mg tonight for symptomatic management given his significant agitation, monitor QTC - Neurology will follow up EEG, mental status  Lesleigh Noe MD-PhD [facility] [phone] [facility] coverage for [facility] is from 8 AM to 4 AM in-house and 4 PM to 8 PM by telephone/video. 8 PM to 8 AM emergent questions or overnight urgent questions should be addressed to Teleneurology On-call or [facility] neurohospitalist; contact information can be found on AMION   Total critical care time: 50 minutes   Critical care time was exclusive of separately billable procedures and treating other patients.   Critical care was necessary to treat or prevent imminent or life-threatening deterioration, evaluation for potential treatment with thrombolytic agent or possible thrombectomy for  acute neurological changes.   Critical care was time spent personally by me on the following activities: development of treatment plan with patient and/or surrogate as well as nursing, discussions with consultants/primary team, evaluation of patient's response to treatment, examination of patient, obtaining history from patient or surrogate, ordering and performing treatments and interventions, ordering and review of laboratory studies, ordering and review of radiographic studies, and re-evaluation of patient's condition as needed, as documented above.

## 2020-11-03 NOTE — ED Notes (Addendum)
Patient returns from MRI, daughter at bedside. Aphasia continues. Patient is restless in the bed, still slightly agitated.

## 2020-11-03 NOTE — ED Triage Notes (Signed)
Pt in via private vehicle, pt daughter reports pt sx's started about 20 minutes PTA.  Pt daughter reports she noticed 2 days ago that he was a little altered and feeling tired and not himself but then today he stopped talking to her and started trying pick up things that weren't there. Pt with hx of tia's

## 2020-11-03 NOTE — H&P (Signed)
History and Physical   Steve Ramirez KYH:[id number] DOB: December 23, 1935 DOA: 11/03/2020  Referring MD/NP/PA: Dr. Jari Pigg  PCP: Sharilyn Sites, MD   Outpatient Specialists: Dr. Curly Shores  Patient coming from: Home  Chief Complaint: Code stroke  HPI: Steve Ramirez is a 85 y.o. male with medical history significant of multiple CVAs and TIAs, essential hypertension, GI bleed, history of melanoma and diabetes, hyperlipidemia who was brought in from home for facial droop and weakness on the left.  Symptoms were transient.  Started about 30 minutes prior to arrival.  A code stroke was called.  Son and daughter-in-law noted the left-sided facial droop initially.  A code stroke was called for in the ER.  Patient was seen by neurology and evaluated.  Symptoms have returned to normal and was at baseline.  Initial head CT was negative.  Suspicion was for possible TIA versus seizure.  Recommendation is to admit for further evaluation.  Patient's family member also recently had meningitis apparently.  He is not febrile and no Kernig sign but if symptoms warranted evaluation for meningitis may likely be done.  Patient had recent work-up including MRI and echocardiogram was in September.  MRI has been ordered again tonight and is going to be admitted for evaluation..  ED Course: Temperature 98.5, blood pressure 194/86, pulse 90 respirate 23 oxygen sat 95% on room air.  White count 7.1 hemoglobin 12.5 platelets 288.  Chemistry largely within normal.  Troponin 15.  Acute viral screen including COVID-19 is negative.  Head CT without contrast is negative.  Patient will be admitted for evaluation of TIA versus seizure.  Review of Systems: As per HPI otherwise 10 point review of systems negative.    Past Medical History:  Diagnosis Date   Coronary atherosclerosis of native coronary artery    Essential hypertension, benign    GIB (gastrointestinal bleeding)    Colonic diverticular bleed   History of melanoma     History of TIAs    Stroke [facility])    Type 2 diabetes mellitus ([place])     Past Surgical History:  Procedure Laterality Date   COLONOSCOPY  08/13/2010   Procedure: COLONOSCOPY;  Surgeon: Rogene Houston, MD;  Location: [facility] ENDO SUITE;  Service: Endoscopy;  Laterality: N/A;   JOINT REPLACEMENT     KNEE SURGERY     arthroscopic   LEFT HEART CATHETERIZATION WITH CORONARY ANGIOGRAM N/A 11/24/2010   Procedure: LEFT HEART CATHETERIZATION WITH CORONARY ANGIOGRAM;  Surgeon: Josue Hector, MD;  Location: [facility] CATH LAB;  Service: Cardiovascular;  Laterality: N/A;   neck fusion     years ago   TOTAL HIP ARTHROPLASTY Right 08/02/2018   Procedure: TOTAL HIP ARTHROPLASTY ANTERIOR APPROACH;  Surgeon: Renette Butters, MD;  Location: [facility] ORS;  Service: Orthopedics;  Laterality: Right;   TOTAL HIP ARTHROPLASTY Left 10/18/2018   Procedure: TOTAL HIP ARTHROPLASTY ANTERIOR APPROACH;  Surgeon: Renette Butters, MD;  Location: [facility] ORS;  Service: Orthopedics;  Laterality: Left;     reports that he quit smoking about 40 years ago. His smoking use included cigarettes. He has never used smokeless tobacco. He reports current alcohol use. He reports that he does not use drugs.  No Known Allergies  Family History  Problem Relation Age of Onset   Coronary artery disease Father    Asthma Other    Emphysema Mother    Coronary artery disease Paternal Uncle    Brain cancer Brother      Prior to Admission medications  Medication Sig Start Date End Date Taking? Authorizing Provider  aspirin EC 81 MG tablet Take 1 tablet (81 mg total) by mouth 2 (two) times daily. For DVT prophylaxis for 30 days after surgery. 10/18/18  Yes Prudencio Burly III, PA-C  cetirizine (ZYRTEC) 10 MG tablet Take 10 mg by mouth daily.   Yes [provider]  clopidogrel (PLAVIX) 75 MG tablet Take 1 tablet (75 mg total) by mouth daily. 06/10/17  Yes Hagler, Apolonio Schneiders, MD  escitalopram (LEXAPRO) 10 MG tablet Take 1 tablet (10 mg total)  by mouth daily. 06/10/17  Yes Caren Macadam, MD  ferrous sulfate 325 (65 FE) MG tablet Take 325 mg by mouth daily with breakfast.   Yes [provider]  isosorbide mononitrate (IMDUR) 30 MG 24 hr tablet Take 30 mg by mouth every evening.  11/30/16  Yes [provider]  levothyroxine (SYNTHROID, LEVOTHROID) 50 MCG tablet Take 1 tablet (50 mcg total) by mouth daily. Patient taking differently: Take 50 mcg by mouth daily before breakfast. 06/10/17  Yes Hagler, Apolonio Schneiders, MD  lisinopril (PRINIVIL,ZESTRIL) 40 MG tablet Take 1 tablet (40 mg total) by mouth daily. 04/01/17  Yes Caren Macadam, MD  metFORMIN (GLUCOPHAGE) 1000 MG tablet Take 1,000 mg by mouth 2 (two) times daily. 10/21/20  Yes [provider]  metoprolol succinate (TOPROL-XL) 25 MG 24 hr tablet Take 25 mg by mouth every evening.    Yes [provider]  Multiple Vitamin (MULTIVITAMIN) tablet Take 1 tablet by mouth daily.     Yes [provider]  Omega-3 Fatty Acids (FISH OIL) 1200 MG CAPS Take 1,200 mg by mouth daily.   Yes [provider]  pravastatin (PRAVACHOL) 20 MG tablet Take 20 mg by mouth every evening. 09/04/16  Yes [provider]  rOPINIRole (REQUIP) 0.5 MG tablet Take 2 tablets (1 mg total) by mouth at bedtime. 06/10/17  Yes Caren Macadam, MD  spironolactone (ALDACTONE) 25 MG tablet Take 1 tablet (25 mg total) by mouth daily. Patient taking differently: Take 25 mg by mouth every other day. 06/10/17  Yes Caren Macadam, MD  cloNIDine (CATAPRES - DOSED IN MG/24 HR) 0.3 mg/24hr patch Place 1 patch (0.3 mg total) onto the skin once a week. Patient taking differently: Place 0.3 mg onto the skin every Saturday. 05/26/17   Caren Macadam, MD  escitalopram (LEXAPRO) 20 MG tablet Take 20 mg by mouth daily. 10/26/20   [provider]  metFORMIN (GLUCOPHAGE) 500 MG tablet Take 1 tablet (500 mg total) by mouth 2 (two) times daily. Patient not taking: Reported on 11/03/2020  06/10/17   Caren Macadam, MD  nitroGLYCERIN (NITROSTAT) 0.4 MG SL tablet Place 0.4 mg under the tongue every 5 (five) minutes as needed for chest pain.    [provider]  rosuvastatin (CRESTOR) 40 MG tablet Take 1 tablet (40 mg total) by mouth daily. 11/26/10 04/08/11  Theora Gianotti, NP    Physical Exam: Vitals:   11/03/20 1550 11/03/20 1610 11/03/20 1641 11/03/20 1853  BP: (!) 188/107  (!) 191/109 130/72  Pulse: 70 90 (!) 43 78  Resp: 20  16 (!) 23  Temp: 97.7 F (36.5 C)     TempSrc: Oral     SpO2: 95% 100% 96% 96%  Weight:      Height:          Constitutional: NAD, calm, comfortable Vitals:   11/03/20 1550 11/03/20 1610 11/03/20 1641 11/03/20 1853  BP: (!) 188/107  (!) 191/109 130/72  Pulse:  70 90 (!) 43 78  Resp: 20  16 (!) 23  Temp: 97.7 F (36.5 C)     TempSrc: Oral     SpO2: 95% 100% 96% 96%  Weight:      Height:       Eyes: PERRL, lids and conjunctivae normal ENMT: Mucous membranes are moist. Posterior pharynx clear of any exudate or lesions.Normal dentition.  Neck: normal, supple, no masses, no thyromegaly Respiratory: clear to auscultation bilaterally, no wheezing, no crackles. Normal respiratory effort. No accessory muscle use.  Cardiovascular: Regular rate and rhythm, no murmurs / rubs / gallops. No extremity edema. 2+ pedal pulses. No carotid bruits.  Abdomen: no tenderness, no masses palpated. No hepatosplenomegaly. Bowel sounds positive.  Musculoskeletal: no clubbing / cyanosis. No joint deformity upper and lower extremities. Good ROM, no contractures. Normal muscle tone.  Skin: no rashes, lesions, ulcers. No induration Neurologic: CN 2-12 grossly intact. Sensation intact, DTR normal. Strength 5/5 in all 4.  Psychiatric: Normal judgment and insight. Alert and oriented x 3. Normal mood.     Labs on Admission: I have personally reviewed following labs and imaging studies  CBC: Recent Labs  Lab 11/03/20 1540  WBC 7.3  NEUTROABS  3.8  HGB 12.6*  HCT 41.0  MCV 73.1*  PLT 510   Basic Metabolic Panel: Recent Labs  Lab 11/03/20 1540  NA 139  K 3.7  CL 100  CO2 24  GLUCOSE 132*  BUN 15  CREATININE 0.87  CALCIUM 9.2   GFR: Estimated Creatinine Clearance: 68.2 mL/min (by C-G formula based on SCr of 0.87 mg/dL). Liver Function Tests: Recent Labs  Lab 11/03/20 1540  AST 16  ALT 16  ALKPHOS 70  BILITOT 0.6  PROT 8.0  ALBUMIN 4.7   No results for input(s): LIPASE, AMYLASE in the last 168 hours. No results for input(s): AMMONIA in the last 168 hours. Coagulation Profile: Recent Labs  Lab 11/03/20 1540  INR 1.0   Cardiac Enzymes: No results for input(s): CKTOTAL, CKMB, CKMBINDEX, TROPONINI in the last 168 hours. BNP (last 3 results) No results for input(s): PROBNP in the last 8760 hours. HbA1C: No results for input(s): HGBA1C in the last 72 hours. CBG: Recent Labs  Lab 11/03/20 1547  GLUCAP 124*   Lipid Profile: No results for input(s): CHOL, HDL, LDLCALC, TRIG, CHOLHDL, LDLDIRECT in the last 72 hours. Thyroid Function Tests: No results for input(s): TSH, T4TOTAL, FREET4, T3FREE, THYROIDAB in the last 72 hours. Anemia Panel: No results for input(s): VITAMINB12, FOLATE, FERRITIN, TIBC, IRON, RETICCTPCT in the last 72 hours. Urine analysis:    Component Value Date/Time   COLORURINE YELLOW (A) 10/03/2020 1130   APPEARANCEUR HAZY (A) 10/03/2020 1130   LABSPEC 1.014 10/03/2020 1130   PHURINE 7.0 10/03/2020 1130   GLUCOSEU NEGATIVE 10/03/2020 1130   HGBUR NEGATIVE 10/03/2020 1130   BILIRUBINUR NEGATIVE 10/03/2020 1130   KETONESUR NEGATIVE 10/03/2020 1130   PROTEINUR NEGATIVE 10/03/2020 1130   UROBILINOGEN 0.2 04/15/2010 1425   NITRITE NEGATIVE 10/03/2020 1130   LEUKOCYTESUR NEGATIVE 10/03/2020 1130   Sepsis Labs: @LABRCNTIP (procalcitonin:4,lacticidven:4) ) Recent Results (from the past 240 hour(s))  Resp Panel by RT-PCR (Flu A&B, Covid) Nasopharyngeal Swab     Status: None    Collection Time: 11/03/20  6:55 PM   Specimen: Nasopharyngeal Swab; Nasopharyngeal(NP) swabs in vial transport medium  Result Value Ref Range Status   SARS Coronavirus 2 by RT PCR NEGATIVE NEGATIVE Final    Comment: (NOTE) SARS-CoV-2 target nucleic acids are NOT DETECTED.  The SARS-CoV-2 RNA is generally detectable in upper respiratory specimens during the acute phase of infection. The lowest concentration of SARS-CoV-2 viral copies this assay can detect is 138 copies/mL. A negative result does not preclude SARS-Cov-2 infection and should not be used as the sole basis for treatment or other patient management decisions. A negative result may occur with  improper specimen collection/handling, submission of specimen other than nasopharyngeal swab, presence of viral mutation(s) within the areas targeted by this assay, and inadequate number of viral copies(<138 copies/mL). A negative result must be combined with clinical observations, patient history, and epidemiological information. The expected result is Negative.  Fact Sheet for Patients:  EntrepreneurPulse.com.au  Fact Sheet for Healthcare Providers:  IncredibleEmployment.be  This test is no t yet approved or cleared by the [place] FDA and  has been authorized for detection and/or diagnosis of SARS-CoV-2 by FDA under an Emergency Use Authorization (EUA). This EUA will remain  in effect (meaning this test can be used) for the duration of the COVID-19 declaration under Section 564(b)(1) of the Act, 21 U.S.C.section 360bbb-3(b)(1), unless the authorization is terminated  or revoked sooner.       Influenza A by PCR NEGATIVE NEGATIVE Final   Influenza B by PCR NEGATIVE NEGATIVE Final    Comment: (NOTE) The Xpert Xpress SARS-CoV-2/FLU/RSV plus assay is intended as an aid in the diagnosis of influenza from Nasopharyngeal swab specimens and should not be used as a sole basis for treatment.  Nasal washings and aspirates are unacceptable for Xpert Xpress SARS-CoV-2/FLU/RSV testing.  Fact Sheet for Patients: EntrepreneurPulse.com.au  Fact Sheet for Healthcare Providers: IncredibleEmployment.be  This test is not yet approved or cleared by the [place] FDA and has been authorized for detection and/or diagnosis of SARS-CoV-2 by FDA under an Emergency Use Authorization (EUA). This EUA will remain in effect (meaning this test can be used) for the duration of the COVID-19 declaration under Section 564(b)(1) of the Act, 21 U.S.C. section 360bbb-3(b)(1), unless the authorization is terminated or revoked.  Performed at [facility], [address] [place], [place] [address]      Radiological Exams on Admission: MR BRAIN WO CONTRAST  Result Date: 11/03/2020 CLINICAL DATA:  Neuro deficit, acute, stroke suspected. Speech disturbance. Weakness. EXAM: MRI HEAD WITHOUT CONTRAST TECHNIQUE: Multiplanar, multiecho pulse sequences of the brain and surrounding structures were obtained without intravenous contrast. COMPARISON:  Head CT same day.  MRI 10/03/2020. FINDINGS: Brain: Diffusion imaging does not show any acute or subacute stroke or other cause of restricted diffusion. No focal abnormality seen affecting the brainstem or cerebellum. There are old lacunar infarctions in the left thalamus and posterior limb internal capsule. Extensive chronic small-vessel ischemic changes are present throughout the cerebral hemispheric white matter. No mass, hemorrhage, hydrocephalus or extra-axial collection. Vascular: Major vessels at the base of the brain show flow. Skull and upper cervical spine: Negative Sinuses/Orbits: Clear/normal Other: None IMPRESSION: No acute finding. Chronic small-vessel ischemic changes of the cerebral hemispheric white matter. Old lacunar infarctions left thalamus and posterior limb internal capsule. Electronically Signed   By:  Nelson Chimes M.D.   On: 11/03/2020 16:40   DG Chest Port 1 View  Result Date: 11/03/2020 CLINICAL DATA:  Altered mental status. EXAM: PORTABLE CHEST 1 VIEW COMPARISON:  10/03/2020 FINDINGS: Heart is enlarged. There are no focal consolidations or pleural effusions. Mild perihilar peribronchial thickening. IMPRESSION: Stable cardiomegaly.  Bronchitic changes. Electronically Signed   By: Nolon Nations M.D.   On: 11/03/2020 18:58  CT HEAD CODE STROKE WO CONTRAST  Result Date: 11/03/2020 CLINICAL DATA:  Code stroke. Neuro deficit, acute, stroke suspected. Speech disturbance. Weakness. EXAM: CT HEAD WITHOUT CONTRAST TECHNIQUE: Contiguous axial images were obtained from the base of the skull through the vertex without intravenous contrast. COMPARISON:  MRI 10/03/2020.  CT 10/03/2020. FINDINGS: Brain: Generalized atrophy. Extensive chronic small-vessel ischemic changes of the cerebral hemispheric white matter. Old lacunar infarctions of the left thalamus and posterior limb internal capsule. No sign of acute infarction, mass lesion, hemorrhage, hydrocephalus or extra-axial collection. Vascular: There is atherosclerotic calcification of the major vessels at the base of the brain. Skull: Negative Sinuses/Orbits: Clear Other: None ASPECTS ([place] Stroke Program Early CT Score) - Ganglionic level infarction (caudate, lentiform nuclei, internal capsule, insula, M1-M3 cortex): 7 - Supraganglionic infarction (M4-M6 cortex): 3 Total score (0-10 with 10 being normal): 10 IMPRESSION: 1. No acute CT finding. Atrophy and extensive chronic small-vessel ischemic changes. Old lacunar infarctions left thalamus and posterior limb internal capsule. 2. ASPECTS is 10. 3. These results were called by telephone at the time of interpretation on 11/03/2020 at 4:05 pm to provider [facility] , who verbally acknowledged these results. Electronically Signed   By: Nelson Chimes M.D.   On: 11/03/2020 16:07       Assessment/Plan Principal Problem:   TIA (transient ischemic attack) Active Problems:   Diabetes mellitus without complication (HCC)   Hyperlipidemia   Essential hypertension   Hypothyroidism   CAD (coronary artery disease)   Acute metabolic encephalopathy     #1 TIA versus seizure: Patient will be admitted for observation.  Follow MRI results.  Monitor per neurology recommendations.  Possible seizure.  Currently slightly confused but largely back to normal.  Continue aspirin with Plavix and pravastatin.  #2 diabetes: On metformin at home which we will hold.  Sliding scale insulin here.  #3 essential hypertension: Continue metoprolol and lisinopril.  Also on Imdur  #4 coronary artery disease: Continue aspirin Plavix and Imdur.  #5 hypothyroidism: Continue with levothyroxine.  #6 hyperlipidemia: Continue with statin.    DVT prophylaxis: Lovenox Code Status: Full code Family Communication: Son and daughter-in-law Disposition Plan: Home Consults called: Dr. Curly Shores, neurology Admission status: Observation  Severity of Illness: The appropriate patient status for this patient is OBSERVATION. Observation status is judged to be reasonable and necessary in order to provide the required intensity of service to ensure the patient's safety. The patient's presenting symptoms, physical exam findings, and initial radiographic and laboratory data in the context of their medical condition is felt to place them at decreased risk for further clinical deterioration. Furthermore, it is anticipated that the patient will be medically stable for discharge from the hospital within 2 midnights of admission.    Barbette Merino MD [facility] Pager [phone]  If 7PM-7AM, please contact night-coverage [email or web address] Password [email or web address]  11/03/2020, 8:03 PM

## 2020-11-03 NOTE — Progress Notes (Signed)
Chaplain Maggie made initial visit at bedside. Pt's daughter in law was present during entire visit. When pt went for scan Chaplain remained with family to offer ministry of presence, listening and hospitality. The visit culminated in prayer for calm. Continued support available per on call chaplain.

## 2020-11-03 NOTE — ED Provider Notes (Signed)
[facility] Emergency Department Provider Note  ____________________________________________   Event Date/Time   First MD Initiated Contact with Patient 11/03/20 1554     (approximate)  I have reviewed the triage vital signs and the nursing notes.   HISTORY  Chief Complaint Code Stroke    HPI Steve Ramirez is a 85 y.o. male  hypertension, hyperlipidemia, diabetes mellitus, multiple stroke and TIA (3 strokes and 7 TIA per his son), hypothyroidism, depression, diverticular GI bleeding, CAD, syncope who comes in with concerns for facial droop and weakness.  Family reported symptoms started about 20 minutes prior to arrival so around 3:20 PM on 11/03/2020.  Daughter-in-law noted some left-sided facial droop, constant, nothing made it better or worse.  She reported that yesterday he was a little bit tired but otherwise he is acting his normal self.  Denies any chest pain, shortness of breath.  Patient keeps stating that he does not want to be here and the daughter-in-law is not trying to encourage him for why we are working this up.  Review of records patient was last here in September and had an MRI that was negative patient was continued on aspirin, Plavix had ultrasounds of the carotids that were unremarkable and had an echocardiogram done.          Past Medical History:  Diagnosis Date   Coronary atherosclerosis of native coronary artery    Essential hypertension, benign    GIB (gastrointestinal bleeding)    Colonic diverticular bleed   History of melanoma    History of TIAs    Stroke [facility])    Type 2 diabetes mellitus ([place])     Patient Active Problem List   Diagnosis Date Noted   Depression 10/03/2020   Hypothyroidism 10/03/2020   CAD (coronary artery disease) 24/09/7351   Acute metabolic encephalopathy 29/92/4268   Primary osteoarthritis of left hip 09/14/2018   Primary localized osteoarthritis of hip 08/02/2018   Primary osteoarthritis  of right hip 07/07/2018   Near syncope 12/03/2016   CVA (cerebral vascular accident) ([place]) 12/03/2016   Right hemiparesis ([place]) 12/02/2016   Acute ischemic stroke ([place]) 12/02/2016   Essential hypertension 12/02/2016   Dysphagia 12/02/2016   Acute CVA (cerebrovascular accident) ([place]) 09/13/2015   TIA (transient ischemic attack) 11/29/2014   Shoulder stiffness 09/05/2012   Right rotator cuff tear 08/16/2012   Shoulder subluxation, right 08/09/2012   Elevated transaminase level 11/05/2011   Coronary atherosclerosis of native coronary artery 11/26/2010   Essential hypertension, benign 11/23/2010   Diabetes mellitus without complication ([place]) 34/19/6222   Hyperlipidemia 11/23/2010   History of TIAs 11/23/2010    Past Surgical History:  Procedure Laterality Date   COLONOSCOPY  08/13/2010   Procedure: COLONOSCOPY;  Surgeon: Rogene Houston, MD;  Location: [facility] ENDO SU[facility];  Service: Endoscopy;  Laterality: N/A;   JOINT REPLACEMENT     KNEE SURGERY     arthroscopic   LEFT HEART CATHETERIZATION WITH CORONARY ANGIOGRAM N/A 11/24/2010   Procedure: LEFT HEART CATHETERIZATION WITH CORONARY ANGIOGRAM;  Surgeon: Josue Hector, MD;  Location: [facility] CATH LAB;  Service: Cardiovascular;  Laterality: N/A;   neck fusion     years ago   TOTAL HIP ARTHROPLASTY Right 08/02/2018   Procedure: TOTAL HIP ARTHROPLASTY ANTERIOR APPROACH;  Surgeon: Renette Butters, MD;  Location: [facility] ORS;  Service: Orthopedics;  Laterality: Right;   TOTAL HIP ARTHROPLASTY Left 10/18/2018   Procedure: TOTAL HIP ARTHROPLASTY ANTERIOR APPROACH;  Surgeon: Renette Butters, MD;  Location:  [facility] ORS;  Service: Orthopedics;  Laterality: Left;    Prior to Admission medications   Medication Sig Start Date End Date Taking? Authorizing Provider  aspirin EC 81 MG tablet Take 1 tablet (81 mg total) by mouth 2 (two) times daily. For DVT prophylaxis for 30 days after surgery. 10/18/18   Prudencio Burly III, PA-C  cetirizine (ZYRTEC) 10 MG  tablet Take 10 mg by mouth daily.    [provider]  cloNIDine (CATAPRES - DOSED IN MG/24 HR) 0.3 mg/24hr patch Place 1 patch (0.3 mg total) onto the skin once a week. Patient taking differently: Place 0.3 mg onto the skin every Saturday. 05/26/17   Caren Macadam, MD  clopidogrel (PLAVIX) 75 MG tablet Take 1 tablet (75 mg total) by mouth daily. 06/10/17   Caren Macadam, MD  escitalopram (LEXAPRO) 10 MG tablet Take 1 tablet (10 mg total) by mouth daily. 06/10/17   Caren Macadam, MD  ferrous sulfate 325 (65 FE) MG tablet Take 325 mg by mouth daily with breakfast.    [provider]  isosorbide mononitrate (IMDUR) 30 MG 24 hr tablet Take 30 mg by mouth every evening.  11/30/16   [provider]  levothyroxine (SYNTHROID, LEVOTHROID) 50 MCG tablet Take 1 tablet (50 mcg total) by mouth daily. Patient taking differently: Take 50 mcg by mouth daily before breakfast. 06/10/17   Caren Macadam, MD  lisinopril (PRINIVIL,ZESTRIL) 40 MG tablet Take 1 tablet (40 mg total) by mouth daily. 04/01/17   Caren Macadam, MD  metFORMIN (GLUCOPHAGE) 500 MG tablet Take 1 tablet (500 mg total) by mouth 2 (two) times daily. Patient taking differently: Take 1,000 mg by mouth 2 (two) times daily with a meal. 06/10/17   Caren Macadam, MD  metoprolol succinate (TOPROL-XL) 25 MG 24 hr tablet Take 25 mg by mouth every evening.     [provider]  Multiple Vitamin (MULTIVITAMIN) tablet Take 1 tablet by mouth daily.      [provider]  nitroGLYCERIN (NITROSTAT) 0.4 MG SL tablet Place 0.4 mg under the tongue every 5 (five) minutes as needed for chest pain.    [provider]  Omega-3 Fatty Acids (FISH OIL) 1200 MG CAPS Take 1,200 mg by mouth daily.    [provider]  pravastatin (PRAVACHOL) 20 MG tablet Take 20 mg by mouth every evening. 09/04/16   [provider]  rOPINIRole (REQUIP) 0.5 MG tablet Take 2 tablets (1 mg total) by mouth at bedtime. 06/10/17    Caren Macadam, MD  spironolactone (ALDACTONE) 25 MG tablet Take 1 tablet (25 mg total) by mouth daily. Patient taking differently: Take 25 mg by mouth every other day. 06/10/17   Caren Macadam, MD  rosuvastatin (CRESTOR) 40 MG tablet Take 1 tablet (40 mg total) by mouth daily. 11/26/10 04/08/11  Theora Gianotti, NP    Allergies Patient has no known allergies.  Family History  Problem Relation Age of Onset   Coronary artery disease Father    Asthma Other    Emphysema Mother    Coronary artery disease Paternal Uncle    Brain cancer Brother     Social History Social History   Tobacco Use   Smoking status: Former    Types: Cigarettes    Quit date: 01/13/1980    Years since quitting: 40.8   Smokeless tobacco: Never  Vaping Use   Vaping Use: Never used  Substance Use Topics   Alcohol use: Yes    Comment: occasionally   Drug  use: No      Review of Systems Constitutional: No fever/chills Eyes: No visual changes. ENT: No sore throat. Cardiovascular: Denies chest pain. Respiratory: Denies shortness of breath. Gastrointestinal: No abdominal pain.  No nausea, no vomiting.  No diarrhea.  No constipation. Genitourinary: Negative for dysuria. Musculoskeletal: Negative for back pain. Skin: Negative for rash. Neurological: Positive facial droop All other ROS negative ____________________________________________   PHYSICAL EXAM:  VITAL SIGNS: ED Triage Vitals  Enc Vitals Group     BP 11/03/20 1550 (!) 188/107     Pulse Rate 11/03/20 1550 70     Resp 11/03/20 1550 20     Temp 11/03/20 1550 97.7 F (36.5 C)     Temp Source 11/03/20 1550 Oral     SpO2 11/03/20 1550 95 %     Weight 11/03/20 1549 187 lb 6.3 oz (85 kg)     Height 11/03/20 1549 5\' 8"  (1.727 m)     Head Circumference --      [facility] Flow --      Pain Score 11/03/20 1549 0     Pain Loc --      Pain Edu? --      Excl. in [place]? --     Constitutional: Alert but patient is agitated continue asking why he  needs to stay here Eyes: Conjunctivae are normal. EOMI. Head: Atraumatic. Nose: No congestion/rhinnorhea. Mouth/Throat: Mucous membranes are moist.   Neck: No stridor. Trachea Midline. FROM Cardiovascular: Normal rate, regular rhythm. Grossly normal heart sounds.  Good peripheral circulation. Respiratory: Normal respiratory effort.  No retractions. Lungs CTAB. Gastrointestinal: Soft and nontender. No distention. No abdominal bruits.  Musculoskeletal: No lower extremity tenderness nor edema.  No joint effusions. Neurologic:  Normal speech and language. No gross focal neurologic deficits are appreciated.  No obvious deficits at this time but patient is agitated Skin:  Skin is warm, dry and intact. No rash noted. Psychiatric: Mood and affect are normal. Speech and behavior are normal. GU: Deferred   ____________________________________________   LABS (all labs ordered are listed, but only abnormal results are displayed)  Labs Reviewed  CBG MONITORING, ED - Abnormal; Notable for the following components:      Result Value   Glucose-Capillary 124 (*)    All other components within normal limits  PROTIME-INR  APTT  CBC  DIFFERENTIAL  COMPREHENSIVE METABOLIC PANEL  I-STAT CREATININE, ED   ____________________________________________   ED ECG REPORT I, Vanessa [employer], the attending physician, personally viewed and interpreted this ECG.  Normal sinus no st elevation, no twi, RBBB, type 1 av block  ____________________________________________  RADIOLOGY   Official radiology report(s): MR BRAIN WO CONTRAST  Result Date: 11/03/2020 CLINICAL DATA:  Neuro deficit, acute, stroke suspected. Speech disturbance. Weakness. EXAM: MRI HEAD WITHOUT CONTRAST TECHNIQUE: Multiplanar, multiecho pulse sequences of the brain and surrounding structures were obtained without intravenous contrast. COMPARISON:  Head CT same day.  MRI 10/03/2020. FINDINGS: Brain: Diffusion imaging does not show any  acute or subacute stroke or other cause of restricted diffusion. No focal abnormality seen affecting the brainstem or cerebellum. There are old lacunar infarctions in the left thalamus and posterior limb internal capsule. Extensive chronic small-vessel ischemic changes are present throughout the cerebral hemispheric white matter. No mass, hemorrhage, hydrocephalus or extra-axial collection. Vascular: Major vessels at the base of the brain show flow. Skull and upper cervical spine: Negative Sinuses/Orbits: Clear/normal Other: None IMPRESSION: No acute finding. Chronic small-vessel ischemic changes of the cerebral hemispheric white matter. Old  lacunar infarctions left thalamus and posterior limb internal capsule. Electronically Signed   By: Nelson Chimes M.D.   On: 11/03/2020 16:40   CT HEAD CODE STROKE WO CONTRAST  Result Date: 11/03/2020 CLINICAL DATA:  Code stroke. Neuro deficit, acute, stroke suspected. Speech disturbance. Weakness. EXAM: CT HEAD WITHOUT CONTRAST TECHNIQUE: Contiguous axial images were obtained from the base of the skull through the vertex without intravenous contrast. COMPARISON:  MRI 10/03/2020.  CT 10/03/2020. FINDINGS: Brain: Generalized atrophy. Extensive chronic small-vessel ischemic changes of the cerebral hemispheric white matter. Old lacunar infarctions of the left thalamus and posterior limb internal capsule. No sign of acute infarction, mass lesion, hemorrhage, hydrocephalus or extra-axial collection. Vascular: There is atherosclerotic calcification of the major vessels at the base of the brain. Skull: Negative Sinuses/Orbits: Clear Other: None ASPECTS ([place] Stroke Program Early CT Score) - Ganglionic level infarction (caudate, lentiform nuclei, internal capsule, insula, M1-M3 cortex): 7 - Supraganglionic infarction (M4-M6 cortex): 3 Total score (0-10 with 10 being normal): 10 IMPRESSION: 1. No acute CT finding. Atrophy and extensive chronic small-vessel ischemic changes. Old  lacunar infarctions left thalamus and posterior limb internal capsule. 2. ASPECTS is 10. 3. These results were called by telephone at the time of interpretation on 11/03/2020 at 4:05 pm to provider [facility] , who verbally acknowledged these results. Electronically Signed   By: Nelson Chimes M.D.   On: 11/03/2020 16:07    ____________________________________________   PROCEDURES  Procedure(s) performed (including Critical Care):  Procedures   ____________________________________________   INITIAL IMPRESSION / ASSESSMENT AND PLAN / ED COURSE  Steve Ramirez was evaluated in Emergency Department on 11/03/2020 for the symptoms described in the history of present illness. He was evaluated in the context of the global COVID-19 pandemic, which necessitated consideration that the patient might be at risk for infection with the SARS-CoV-2 virus that causes COVID-19. Institutional protocols and algorithms that pertain to the evaluation of patients at risk for COVID-19 are in a state of rapid change based on information released by regulatory bodies including the CDC and federal and state organizations. These policies and algorithms were followed during the patient's care in the ED.    Stroke code called from triage.  CT head ordered evaluate for any intracranial hemorrhage.  Neurology Dr. Curly Shores is at bedside he wants to get a stat MRI to rule out ischemic stroke.  Labs ordered to evaluate for any electrolyte abnormalities, AKI, ACS or other acute causes.  We will add on a COVID test.  CT head and MRI were negative.  Labs are reassuring.  Discussed with neurology and they thought that patient does have known stenosis could be a TIA versus possible seizure given his prolonged agitation.  His wife also had a history of meningitis to consider LP tomorrow if not clearing.  However at this time it seems less likely.  Recommends admission for further work-up          ____________________________________________   FINAL CLINICAL IMPRESSION(S) / ED DIAGNOSES   Final diagnoses:  Altered mental status, unspecified altered mental status type      MEDICATIONS GIVEN DURING THIS VISIT:  Medications  sodium chloride flush (NS) 0.9 % injection 3 mL (has no administration in time range)  QUEtiapine (SEROQUEL) tablet 25 mg (has no administration in time range)  LORazepam (ATIVAN) 2 MG/ML injection (1 mg  Given 11/03/20 1617)     ED Discharge Orders     None        Note:  This document was prepared using Dragon voice recognition software and may include unintentional dictation errors.    Vanessa [place], MD 11/03/20 2001

## 2020-11-03 NOTE — ED Notes (Signed)
Patient incontinent, adult incontinence brief changed, bedding changed. Patient repositioned in bed.

## 2020-11-04 DIAGNOSIS — R4701 Aphasia: Secondary | ICD-10-CM | POA: Diagnosis present

## 2020-11-04 DIAGNOSIS — Z825 Family history of asthma and other chronic lower respiratory diseases: Secondary | ICD-10-CM | POA: Diagnosis not present

## 2020-11-04 DIAGNOSIS — Z8249 Family history of ischemic heart disease and other diseases of the circulatory system: Secondary | ICD-10-CM | POA: Diagnosis not present

## 2020-11-04 DIAGNOSIS — Z981 Arthrodesis status: Secondary | ICD-10-CM | POA: Diagnosis not present

## 2020-11-04 DIAGNOSIS — R29810 Facial weakness: Secondary | ICD-10-CM | POA: Diagnosis present

## 2020-11-04 DIAGNOSIS — E039 Hypothyroidism, unspecified: Secondary | ICD-10-CM | POA: Diagnosis present

## 2020-11-04 DIAGNOSIS — I2510 Atherosclerotic heart disease of native coronary artery without angina pectoris: Secondary | ICD-10-CM | POA: Diagnosis present

## 2020-11-04 DIAGNOSIS — Z7902 Long term (current) use of antithrombotics/antiplatelets: Secondary | ICD-10-CM | POA: Diagnosis not present

## 2020-11-04 DIAGNOSIS — E119 Type 2 diabetes mellitus without complications: Secondary | ICD-10-CM | POA: Diagnosis present

## 2020-11-04 DIAGNOSIS — G9341 Metabolic encephalopathy: Secondary | ICD-10-CM | POA: Diagnosis present

## 2020-11-04 DIAGNOSIS — Z96643 Presence of artificial hip joint, bilateral: Secondary | ICD-10-CM | POA: Diagnosis present

## 2020-11-04 DIAGNOSIS — G459 Transient cerebral ischemic attack, unspecified: Secondary | ICD-10-CM | POA: Diagnosis present

## 2020-11-04 DIAGNOSIS — I10 Essential (primary) hypertension: Secondary | ICD-10-CM | POA: Diagnosis present

## 2020-11-04 DIAGNOSIS — Z20822 Contact with and (suspected) exposure to covid-19: Secondary | ICD-10-CM | POA: Diagnosis present

## 2020-11-04 DIAGNOSIS — Z85820 Personal history of malignant melanoma of skin: Secondary | ICD-10-CM | POA: Diagnosis not present

## 2020-11-04 DIAGNOSIS — E785 Hyperlipidemia, unspecified: Secondary | ICD-10-CM | POA: Diagnosis present

## 2020-11-04 DIAGNOSIS — Z79890 Hormone replacement therapy (postmenopausal): Secondary | ICD-10-CM | POA: Diagnosis not present

## 2020-11-04 DIAGNOSIS — Z79899 Other long term (current) drug therapy: Secondary | ICD-10-CM | POA: Diagnosis not present

## 2020-11-04 DIAGNOSIS — Z808 Family history of malignant neoplasm of other organs or systems: Secondary | ICD-10-CM | POA: Diagnosis not present

## 2020-11-04 DIAGNOSIS — R531 Weakness: Secondary | ICD-10-CM | POA: Diagnosis present

## 2020-11-04 DIAGNOSIS — Z7982 Long term (current) use of aspirin: Secondary | ICD-10-CM | POA: Diagnosis not present

## 2020-11-04 DIAGNOSIS — Z7984 Long term (current) use of oral hypoglycemic drugs: Secondary | ICD-10-CM | POA: Diagnosis not present

## 2020-11-04 DIAGNOSIS — I672 Cerebral atherosclerosis: Secondary | ICD-10-CM | POA: Diagnosis present

## 2020-11-04 DIAGNOSIS — Z87891 Personal history of nicotine dependence: Secondary | ICD-10-CM | POA: Diagnosis not present

## 2020-11-04 LAB — HIV ANTIBODY (ROUTINE TESTING W REFLEX): HIV Screen 4th Generation wRfx: NONREACTIVE

## 2020-11-04 LAB — URINALYSIS, COMPLETE (UACMP) WITH MICROSCOPIC
Bacteria, UA: NONE SEEN
Bilirubin Urine: NEGATIVE
Glucose, UA: NEGATIVE mg/dL
Hgb urine dipstick: NEGATIVE
Ketones, ur: NEGATIVE mg/dL
Leukocytes,Ua: NEGATIVE
Nitrite: NEGATIVE
Protein, ur: NEGATIVE mg/dL
Specific Gravity, Urine: 1.016 (ref 1.005–1.030)
pH: 6 (ref 5.0–8.0)

## 2020-11-04 LAB — URINE DRUG SCREEN, QUALITATIVE (ARMC ONLY)
Amphetamines, Ur Screen: NOT DETECTED
Barbiturates, Ur Screen: NOT DETECTED
Benzodiazepine, Ur Scrn: NOT DETECTED
Cannabinoid 50 Ng, Ur ~~LOC~~: NOT DETECTED
Cocaine Metabolite,Ur ~~LOC~~: NOT DETECTED
MDMA (Ecstasy)Ur Screen: NOT DETECTED
Methadone Scn, Ur: NOT DETECTED
Opiate, Ur Screen: NOT DETECTED
Phencyclidine (PCP) Ur S: NOT DETECTED
Tricyclic, Ur Screen: NOT DETECTED

## 2020-11-04 LAB — RPR: RPR Ser Ql: NONREACTIVE

## 2020-11-04 LAB — VITAMIN B12: Vitamin B-12: 197 pg/mL (ref 180–914)

## 2020-11-04 LAB — GLUCOSE, CAPILLARY
Glucose-Capillary: 139 mg/dL — ABNORMAL HIGH (ref 70–99)
Glucose-Capillary: 129 mg/dL — ABNORMAL HIGH (ref 70–99)
Glucose-Capillary: 154 mg/dL — ABNORMAL HIGH (ref 70–99)
Glucose-Capillary: 173 mg/dL — ABNORMAL HIGH (ref 70–99)

## 2020-11-04 LAB — TSH: TSH: 2.489 u[IU]/mL (ref 0.350–4.500)

## 2020-11-04 LAB — AMMONIA: Ammonia: 22 umol/L (ref 9–35)

## 2020-11-04 MED ORDER — CYANOCOBALAMIN 1000 MCG/ML IJ SOLN
1000.0000 ug | Freq: Every day | INTRAMUSCULAR | Status: DC
  Administered 2020-11-04 – 2020-11-05 (×2): 1000 ug via INTRAMUSCULAR
  Filled 2020-11-04 (×2): qty 1

## 2020-11-04 MED ORDER — ASPIRIN EC 81 MG PO TBEC
81.0000 mg | DELAYED_RELEASE_TABLET | Freq: Every day | ORAL | Status: DC
  Administered 2020-11-04 – 2020-11-05 (×2): 81 mg via ORAL
  Filled 2020-11-04 (×2): qty 1

## 2020-11-04 NOTE — Procedures (Signed)
Routine EEG Report  Steve Ramirez is a 84 y.o. male with a history of spells who is undergoing an EEG to evaluate for seizures.  Report: This EEG was acquired with electrodes placed according to the International 10-20 electrode system (including Fp1, Fp2, F3, F4, C3, C4, P3, P4, O1, O2, T3, T4, T5, T6, A1, A2, Fz, Cz, Pz). The following electrodes were missing or displaced: none.  The occipital dominant rhythm was 7 Hz. This activity is reactive to stimulation. Drowsiness was manifested by background fragmentation; deeper stages of sleep were identified by K complexes and sleep spindles. There was no focal slowing. There were no interictal epileptiform discharges. There were no electrographic seizures identified. There was no abnormal response to photic stimulation. Hyperventilation was not performed.   Impression and clinical correlation: This EEG was obtained while awake and asleep and is abnormal due to mild diffuse slowing. There were no epileptiform abnl observed during this recording.  Su Monks, MD [facility] [phone]  If 7pm- 7am, please page neurology on call as listed in [place].

## 2020-11-04 NOTE — Plan of Care (Signed)
Neurology Plan of Care  EEG shows mild diffuse slowing, no epileptiform abnl. MRI neg for stroke, and my suspicion for sz is very low. Lab workup unrevealing, but UA and UDS still need to be collected. He has had multiple similar presentations. No further inpatient neurologic workup indicated, however patient should be referred to outpatient neurology for dementia workup after discharge.  Neurology to sign off, but please re-engage if additional questions arise. D/w Dr. Roosevelt Locks.  Su Monks, MD [facility] [phone]  If 7pm- 7am, please page neurology on call as listed in [place].

## 2020-11-04 NOTE — TOC Initial Note (Signed)
Transition of Care [facility]) - Initial/Assessment Note    Patient Details  Name: Steve Ramirez MRN: [id number] Date of Birth: 1935/12/25  [facility] CM/SW Contact:    Shelbie Hutching, RN Phone Number: 11/04/2020, 3:49 PM  Clinical Narrative:                 Patient admitted to the hospital with TIA.  RNCM met with patient and patient's youngest son, [name], at the bedside today.  Patient eating lunch and son reports that he is much more alert than even earlier today.  Patient lives at [place] with his wife, who currently is at [facility] for rehab.  Patient would like to return to [place] at discharge.  Patient usually uses a rollator, PT is recommending HH and a RW.   Message left with Judson Roch at [facility] to see if they will provide [facility] services over at [facility].    Expected Discharge Plan: Assisted Living Barriers to Discharge: Continued Medical Work up   Patient Goals and CMS Choice Patient states their goals for this hospitalization and ongoing recovery are:: Patient would like to go back to [facility] CMS Medicare.gov Compare Post Acute Care list provided to:: Patient Choice offered to / list presented to : Patient  Expected Discharge Plan and Services Expected Discharge Plan: Assisted Living   Discharge Planning Services: CM Consult   Living arrangements for the past 2 months: [place]                 DME Arranged: N/A DME Agency: NA                  Prior Living Arrangements/Services Living arrangements for the past 2 months: [place] Lives with:: Facility Resident, Spouse Patient language and need for interpreter reviewed:: Yes Do you feel safe going back to the place where you live?: Yes      Need for Family Participation in Patient Care: Yes (Comment) (TIA) Care giver support system in place?: Yes (comment) (son and daughter in Sports [place])   Criminal Activity/Legal Involvement Pertinent to Current  Situation/Hospitalization: No - Comment as needed  Activities of Daily Living Home Assistive Devices/Equipment: Other (Comment) (patient states he does not use any of the above) ADL Screening (condition at time of admission) Patient's cognitive ability adequate to safely complete daily activities?: No Is the patient deaf or have difficulty hearing?: No Does the patient have difficulty seeing, even when wearing glasses/contacts?: No Does the patient have difficulty concentrating, remembering, or making decisions?: Yes Patient able to express need for assistance with ADLs?: No Does the patient have difficulty dressing or bathing?: Yes Independently performs ADLs?: No Communication: Independent Dressing (OT): Needs assistance Is this a change from baseline?: Pre-admission baseline Grooming: Needs assistance Is this a change from baseline?: Pre-admission baseline Feeding: Independent with device (comment) Bathing: Needs assistance Is this a change from baseline?: Pre-admission baseline Toileting: Dependent Is this a change from baseline?: Pre-admission baseline In/Out Bed: Needs assistance Is this a change from baseline?: Pre-admission baseline Walks in Home: Independent with device (comment) Does the patient have difficulty walking or climbing stairs?: Yes Weakness of Legs: Both Weakness of Arms/Hands: Both  Permission Sought/Granted Permission sought to share information with : Case Manager, Family Supports, Chartered certified accountant granted to share information with : Yes, Verbal Permission Granted  Share Information with NAME: Saron Tweed  Permission granted to share info w AGENCY: [facility]  Permission granted to share info w  Relationship: son     Emotional Assessment   Attitude/Demeanor/Rapport: Engaged Affect (typically observed): Accepting Orientation: : Oriented to Self, Oriented to Place Alcohol / Substance Use: Not Applicable Psych  Involvement: No (comment)  Admission diagnosis:  TIA (transient ischemic attack) [G45.9] SOB (shortness of breath) [R06.02] Altered mental status, unspecified altered mental status type [R41.82] AMS (altered mental status) [W23.76] Acute metabolic encephalopathy [E83.15] Patient Active Problem List   Diagnosis Date Noted   Depression 10/03/2020   Hypothyroidism 10/03/2020   CAD (coronary artery disease) 17/61/6073   Acute metabolic encephalopathy 71/06/2692   Primary osteoarthritis of left hip 09/14/2018   Primary localized osteoarthritis of hip 08/02/2018   Primary osteoarthritis of right hip 07/07/2018   Near syncope 12/03/2016   CVA (cerebral vascular accident) ([place]) 12/03/2016   Right hemiparesis ([place]) 12/02/2016   Acute ischemic stroke ([place]) 12/02/2016   Essential hypertension 12/02/2016   Dysphagia 12/02/2016   Acute CVA (cerebrovascular accident) ([place]) 09/13/2015   TIA (transient ischemic attack) 11/29/2014   Shoulder stiffness 09/05/2012   Right rotator cuff tear 08/16/2012   Shoulder subluxation, right 08/09/2012   Elevated transaminase level 11/05/2011   Coronary atherosclerosis of native coronary artery 11/26/2010   Essential hypertension, benign 11/23/2010   Diabetes mellitus without complication ([place]) 85/46/2703   Hyperlipidemia 11/23/2010   History of TIAs 11/23/2010   PCP:  Sharilyn Sites, MD Pharmacy:   [place], [place] - [address] [place] [address] Phone: [phone] Fax: [phone]     Social Determinants of Health (SDOH) Interventions    Readmission Risk Interventions No flowsheet data found.

## 2020-11-04 NOTE — Progress Notes (Signed)
PROGRESS NOTE    Steve Ramirez  ONG:[id number] DOB: 1935/02/02 DOA: 11/03/2020 PCP: Sharilyn Sites, MD   Chief complaint.  Altered mental status. Brief Narrative:  Steve Ramirez is a 85 y.o. male with medical history significant of multiple CVAs and TIAs, essential hypertension, GI bleed, history of melanoma and diabetes, hyperlipidemia who was brought in from home for facial droop and weakness on the left.  Symptoms were transient.  Patient was evaluated by neurology, concerning for TIA versus a seizure.  EEG is pending. 10/24.  Patient continued to have a secondary confusion, not at baseline.   Assessment & Plan:   Principal Problem:   TIA (transient ischemic attack) Active Problems:   Diabetes mellitus without complication (HCC)   Hyperlipidemia   Essential hypertension   Hypothyroidism   CAD (coronary artery disease)   Acute metabolic encephalopathy  Acute encephalopathy. TIA. Also need EEG to rule out seizure. Patient has been evaluated by neurology, no need for additional stroke work-up as patient just finished tests last months. B12 level is borderline low, will start gave B12 injection.  TSH level normal. Obtain urinalysis to rule out UTI. There is also concern for patient was not able to swallow, will obtain speech therapy evaluation. Continue lower dose Seroquel. Patient currently lives in assisted living facility by himself, not able to safely discharge today.   Type 2 diabetes. Continue sliding scale insulin   Coronary artery disease. Essential hypertension. Continue monitor blood pressure,    DVT prophylaxis: Lovenox Code Status: full Family Communication: Daughter-in-law updated. Disposition Plan:    Status is: Observation        No intake/output data recorded. Total I/O In: 240 [P.O.:240] Out: -      Consultants:  Neurology  Procedures: None  Antimicrobials: None Subjective: Patient still has some significant  confusion this morning, per daughter-in-law, patient does not have any confusion at home.  He seems slept last night. He no longer has any weakness, no slurred speech. No fever or chills. No dysuria hematuria.  Objective: Vitals:   11/03/20 2023 11/03/20 2149 11/04/20 0615 11/04/20 0748  BP: (!) 194/86 (!) 167/131 (!) 166/109 (!) 149/97  Pulse: 72 84 77 79  Resp: 16 16 16 16   Temp: 98.5 F (36.9 C) 98 F (36.7 C) 98 F (36.7 C) 97.8 F (36.6 C)  TempSrc: Oral Oral Oral Oral  SpO2: 98% 96% 96% 100%  Weight:  82.3 kg    Height:        Intake/Output Summary (Last 24 hours) at 11/04/2020 1059 Last data filed at 11/04/2020 1012 Gross per 24 hour  Intake 240 ml  Output --  Net 240 ml   Filed Weights   11/03/20 1549 11/03/20 2149  Weight: 85 kg 82.3 kg    Examination:  General exam: Appears calm and comfortable  Respiratory system: Clear to auscultation. Respiratory effort normal. Cardiovascular system: S1 & S2 heard, RRR. No JVD, murmurs, rubs, gallops or clicks. No pedal edema. Gastrointestinal system: Abdomen is nondistended, soft and nontender. No organomegaly or masses felt. Normal bowel sounds heard. Central nervous system: Alert and oriented x2. No focal neurological deficits. Extremities: Symmetric 5 x 5 power. Skin: No rashes, lesions or ulcers Psychiatry: Judgement and insight appear normal. Mood & affect appropriate.     Data Reviewed: I have personally reviewed following labs and imaging studies  CBC: Recent Labs  Lab 11/03/20 1540 11/03/20 2152  WBC 7.3 7.1  NEUTROABS 3.8  --   HGB 12.6* 12.5*  HCT 41.0 39.9  MCV 73.1* 71.9*  PLT 321 782   Basic Metabolic Panel: Recent Labs  Lab 11/03/20 1540 11/03/20 2152  NA 139  --   K 3.7  --   CL 100  --   CO2 24  --   GLUCOSE 132*  --   BUN 15  --   CREATININE 0.87 0.77  CALCIUM 9.2  --    GFR: Estimated Creatinine Clearance: 73.2 mL/min (by C-G formula based on SCr of 0.77 mg/dL). Liver  Function Tests: Recent Labs  Lab 11/03/20 1540  AST 16  ALT 16  ALKPHOS 70  BILITOT 0.6  PROT 8.0  ALBUMIN 4.7   No results for input(s): LIPASE, AMYLASE in the last 168 hours. Recent Labs  Lab 11/04/20 0359  AMMONIA 22   Coagulation Profile: Recent Labs  Lab 11/03/20 1540  INR 1.0   Cardiac Enzymes: No results for input(s): CKTOTAL, CKMB, CKMBINDEX, TROPONINI in the last 168 hours. BNP (last 3 results) No results for input(s): PROBNP in the last 8760 hours. HbA1C: No results for input(s): HGBA1C in the last 72 hours. CBG: Recent Labs  Lab 11/03/20 1547 11/03/20 2254 11/04/20 0817  GLUCAP 124* 123* 139*   Lipid Profile: No results for input(s): CHOL, HDL, LDLCALC, TRIG, CHOLHDL, LDLDIRECT in the last 72 hours. Thyroid Function Tests: Recent Labs    11/04/20 0359  TSH 2.489   Anemia Panel: Recent Labs    11/04/20 0359  VITAMINB12 197   Sepsis Labs: No results for input(s): PROCALCITON, LATICACIDVEN in the last 168 hours.  Recent Results (from the past 240 hour(s))  Resp Panel by RT-PCR (Flu A&B, Covid) Nasopharyngeal Swab     Status: None   Collection Time: 11/03/20  6:55 PM   Specimen: Nasopharyngeal Swab; Nasopharyngeal(NP) swabs in vial transport medium  Result Value Ref Range Status   SARS Coronavirus 2 by RT PCR NEGATIVE NEGATIVE Final    Comment: (NOTE) SARS-CoV-2 target nucleic acids are NOT DETECTED.  The SARS-CoV-2 RNA is generally detectable in upper respiratory specimens during the acute phase of infection. The lowest concentration of SARS-CoV-2 viral copies this assay can detect is 138 copies/mL. A negative result does not preclude SARS-Cov-2 infection and should not be used as the sole basis for treatment or other patient management decisions. A negative result may occur with  improper specimen collection/handling, submission of specimen other than nasopharyngeal swab, presence of viral mutation(s) within the areas targeted by this  assay, and inadequate number of viral copies(<138 copies/mL). A negative result must be combined with clinical observations, patient history, and epidemiological information. The expected result is Negative.  Fact Sheet for Patients:  EntrepreneurPulse.com.au  Fact Sheet for Healthcare Providers:  IncredibleEmployment.be  This test is no t yet approved or cleared by the [place] FDA and  has been authorized for detection and/or diagnosis of SARS-CoV-2 by FDA under an Emergency Use Authorization (EUA). This EUA will remain  in effect (meaning this test can be used) for the duration of the COVID-19 declaration under Section 564(b)(1) of the Act, 21 U.S.C.section 360bbb-3(b)(1), unless the authorization is terminated  or revoked sooner.       Influenza A by PCR NEGATIVE NEGATIVE Final   Influenza B by PCR NEGATIVE NEGATIVE Final    Comment: (NOTE) The Xpert Xpress SARS-CoV-2/FLU/RSV plus assay is intended as an aid in the diagnosis of influenza from Nasopharyngeal swab specimens and should not be used as a sole basis for treatment. Nasal washings and aspirates  are unacceptable for Xpert Xpress SARS-CoV-2/FLU/RSV testing.  Fact Sheet for Patients: EntrepreneurPulse.com.au  Fact Sheet for Healthcare Providers: IncredibleEmployment.be  This test is not yet approved or cleared by the [place] FDA and has been authorized for detection and/or diagnosis of SARS-CoV-2 by FDA under an Emergency Use Authorization (EUA). This EUA will remain in effect (meaning this test can be used) for the duration of the COVID-19 declaration under Section 564(b)(1) of the Act, 21 U.S.C. section 360bbb-3(b)(1), unless the authorization is terminated or revoked.  Performed at [facility], [address], [place], [place] [address]          Radiology Studies: MR BRAIN WO CONTRAST  Result Date:  11/03/2020 CLINICAL DATA:  Neuro deficit, acute, stroke suspected. Speech disturbance. Weakness. EXAM: MRI HEAD WITHOUT CONTRAST TECHNIQUE: Multiplanar, multiecho pulse sequences of the brain and surrounding structures were obtained without intravenous contrast. COMPARISON:  Head CT same day.  MRI 10/03/2020. FINDINGS: Brain: Diffusion imaging does not show any acute or subacute stroke or other cause of restricted diffusion. No focal abnormality seen affecting the brainstem or cerebellum. There are old lacunar infarctions in the left thalamus and posterior limb internal capsule. Extensive chronic small-vessel ischemic changes are present throughout the cerebral hemispheric white matter. No mass, hemorrhage, hydrocephalus or extra-axial collection. Vascular: Major vessels at the base of the brain show flow. Skull and upper cervical spine: Negative Sinuses/Orbits: Clear/normal Other: None IMPRESSION: No acute finding. Chronic small-vessel ischemic changes of the cerebral hemispheric white matter. Old lacunar infarctions left thalamus and posterior limb internal capsule. Electronically Signed   By: Nelson Chimes M.D.   On: 11/03/2020 16:40   DG Chest Port 1 View  Result Date: 11/03/2020 CLINICAL DATA:  Altered mental status. EXAM: PORTABLE CHEST 1 VIEW COMPARISON:  10/03/2020 FINDINGS: Heart is enlarged. There are no focal consolidations or pleural effusions. Mild perihilar peribronchial thickening. IMPRESSION: Stable cardiomegaly.  Bronchitic changes. Electronically Signed   By: Nolon Nations M.D.   On: 11/03/2020 18:58   CT HEAD CODE STROKE WO CONTRAST  Result Date: 11/03/2020 CLINICAL DATA:  Code stroke. Neuro deficit, acute, stroke suspected. Speech disturbance. Weakness. EXAM: CT HEAD WITHOUT CONTRAST TECHNIQUE: Contiguous axial images were obtained from the base of the skull through the vertex without intravenous contrast. COMPARISON:  MRI 10/03/2020.  CT 10/03/2020. FINDINGS: Brain: Generalized  atrophy. Extensive chronic small-vessel ischemic changes of the cerebral hemispheric white matter. Old lacunar infarctions of the left thalamus and posterior limb internal capsule. No sign of acute infarction, mass lesion, hemorrhage, hydrocephalus or extra-axial collection. Vascular: There is atherosclerotic calcification of the major vessels at the base of the brain. Skull: Negative Sinuses/Orbits: Clear Other: None ASPECTS ([place] Stroke Program Early CT Score) - Ganglionic level infarction (caudate, lentiform nuclei, internal capsule, insula, M1-M3 cortex): 7 - Supraganglionic infarction (M4-M6 cortex): 3 Total score (0-10 with 10 being normal): 10 IMPRESSION: 1. No acute CT finding. Atrophy and extensive chronic small-vessel ischemic changes. Old lacunar infarctions left thalamus and posterior limb internal capsule. 2. ASPECTS is 10. 3. These results were called by telephone at the time of interpretation on 11/03/2020 at 4:05 pm to provider [facility] , who verbally acknowledged these results. Electronically Signed   By: Nelson Chimes M.D.   On: 11/03/2020 16:07        Scheduled Meds:   stroke: mapping our early stages of recovery book   Does not apply Once   aspirin EC  81 mg Oral Daily   [START ON 11/09/2020]  cloNIDine  0.3 mg Transdermal Q Sat   clopidogrel  75 mg Oral Daily   enoxaparin (LOVENOX) injection  40 mg Subcutaneous Q24H   escitalopram  20 mg Oral Daily   ferrous sulfate  325 mg Oral Q breakfast   insulin aspart  0-15 Units Subcutaneous TID WC   insulin aspart  0-5 Units Subcutaneous QHS   isosorbide mononitrate  30 mg Oral QPM   levothyroxine  50 mcg Oral Q0600   lisinopril  40 mg Oral Daily   loratadine  10 mg Oral Daily   metoprolol succinate  25 mg Oral Q supper   multivitamin with minerals  1 tablet Oral Daily   omega-3 acid ethyl esters  1 g Oral BID   pravastatin  20 mg Oral QPM   rOPINIRole  1 mg Oral QHS   sodium chloride flush  3 mL Intravenous Once    spironolactone  25 mg Oral QODAY   Continuous Infusions:  sodium chloride       LOS: 0 days    Time spent: 27 minutes    Sharen Hones, MD [facility]   To contact the attending provider between 7A-7P or the covering provider during after hours 7P-7A, please log into the web site [email or web address] and access using universal [place] password for that web site. If you do not have the password, please call the hospital operator.  11/04/2020, 10:59 AM

## 2020-11-04 NOTE — Progress Notes (Signed)
Eeg done 

## 2020-11-04 NOTE — Evaluation (Addendum)
Occupational Therapy Evaluation Patient Details Name: Steve Ramirez MRN: [id number] DOB: 02-02-1935 Today's Date: 11/04/2020   History of Present Illness Pt is an 85 y/o M who presented from home on 11/03/20 with c/o facial droop & L side weakness. Symptoms were transient. Of note, pt has family member who recently had meningitis. Pt admitted for further work-up. MRI on 11/02/20 showed no acute findings. PMH: multiple CVAs & TIAs, HTN, GI bleed, melanoma, DM, HLD   Clinical Impression   Pt was seen for OT evaluation this date. Prior to hospital admission, pt was living with his wife in an ALF and reports using a rollator for mobility and requiring PRN assist for bathing, otherwise indep with basic ADL. Pt received seated in recliner, sleepy, but wakes and responds to OT's questions. Daughter in law present and able to verify some information (pt is poor historian). DIL indicates that pt's cognition is worse than baseline as well as more sleepy. Currently pt demonstrates impairments as described below (See OT problem list) which functionally limit his ability to perform ADL/self-care tasks. Pt currently requires MIN A for LB ADL, CGA-MIN A for ADL transfers, and CGA for negotiating room with 2WW. Does demonstrate some impaired safety awareness that puts him at increased risk of falls. DIL also reports a history of several falls 2/2 impaired balance prior to admission. Pt would benefit from skilled OT services to address noted impairments and functional limitations (see below for any additional details) in order to maximize safety and independence while minimizing falls risk and caregiver burden. Upon hospital discharge, recommend HHOT to maximize pt safety and return to functional independence during meaningful occupations of daily life.   Recommendations for follow up therapy are one component of a multi-disciplinary discharge planning process, led by the attending physician.  Recommendations may  be updated based on patient status, additional functional criteria and insurance authorization.   Follow Up Recommendations  Home health OT    Assistance Recommended at Discharge Frequent or constant Supervision/Assistance  Functional Status Assessment  Patient has had a recent decline in their functional status and demonstrates the ability to make significant improvements in function in a reasonable and predictable amount of time.  Equipment Recommendations  Other (comment) (2WW)    Recommendations for Other Services       Precautions / Restrictions Precautions Precautions: Fall Restrictions Weight Bearing Restrictions: No      Mobility Bed Mobility               General bed mobility comments: NT, up in recliner    Transfers Overall transfer level: Needs assistance Equipment used: Rolling walker (2 wheels) Transfers: Sit to/from Stand Sit to Stand: Min assist;Min guard           General transfer comment: CGA from recliner, MIN A from std height toilet      Balance Overall balance assessment: Needs assistance;History of Falls Sitting-balance support: No upper extremity supported;Feet supported Sitting balance-Leahy Scale: Good     Standing balance support: Bilateral upper extremity supported Standing balance-Leahy Scale: Poor Standing balance comment: BUE support on RW & Min assist                           ADL either performed or assessed with clinical judgement   ADL Overall ADL's : Needs assistance/impaired  General ADL Comments: Pt doffed/donned sock while seated in recliner without assist, requires MIN A for LB dressing involving ADL transfers, CGA for ADL mobility +RW     Vision         Perception     Praxis      Pertinent Vitals/Pain Pain Assessment: No/denies pain     Hand Dominance Right   Extremity/Trunk Assessment Upper Extremity Assessment Upper Extremity  Assessment: Generalized weakness (brusing & old skin tears noted to RUE)   Lower Extremity Assessment Lower Extremity Assessment: Generalized weakness       Communication     Cognition Arousal/Alertness: Awake/alert Behavior During Therapy: WFL for tasks assessed/performed;Flat affect Overall Cognitive Status: Impaired/Different from baseline Area of Impairment: Orientation;Memory;Following commands;Awareness;Safety/judgement;Problem solving                 Orientation Level: Disoriented to;Place;Time;Situation   Memory: Decreased short-term memory Following Commands: Follows one step commands consistently;Follows multi-step commands with increased time Safety/Judgement: Decreased awareness of deficits;Decreased awareness of safety Awareness: Intellectual Problem Solving: Slow processing General Comments: PRN VC for sequencing/safety     General Comments       Exercises Other Exercises Other Exercises: ADL transfer training, negotiating obstacles in the room   Shoulder Instructions      Home Living Family/patient expects to be discharged to:: Assisted living Living Arrangements: Spouse/significant other Available Help at Discharge:  (ALF staff) Type of Home: Assisted living Home Access: Level entry     Home Layout: One level               Home Equipment: Standard Walker;Rollator (4 wheels);Grab bars - toilet;Grab bars - tub/shower;Shower seat          Prior Functioning/Environment               Mobility Comments: Per family, pt ambulated with a rollator, was just discharged from PT, but endorses at least 5 falls in the past 6 months. States it's not uncommon for pt to bump into wall in ALF. ADLs Comments: Pt reports indep with dressing, PRN assist for bathing.        OT Problem List: Decreased strength;Decreased cognition;Impaired balance (sitting and/or standing);Decreased activity tolerance;Decreased knowledge of use of DME or AE      OT  Treatment/Interventions: Self-care/ADL training;Therapeutic exercise;Therapeutic activities;DME and/or AE instruction;Patient/family education;Balance training    OT Goals(Current goals can be found in the care plan section) Acute Rehab OT Goals Patient Stated Goal: go back to wife OT Goal Formulation: With patient/family Time For Goal Achievement: 11/18/20 Potential to Achieve Goals: Good ADL Goals Pt Will Transfer to Toilet: ambulating;with supervision (elevated commode, grab bar, LRAD for amb) Additional ADL Goal #1: Pt will perform UB/LB dressing with set up and supervision for safety. Additional ADL Goal #2: Pt will perform shower with remote supervision for safety, shower chair  OT Frequency: Min 1X/week   Barriers to D/C:            Co-evaluation              AM-PAC OT "6 Clicks" Daily Activity     Outcome Measure Help from another person eating meals?: None Help from another person taking care of personal grooming?: A Little Help from another person toileting, which includes using toliet, bedpan, or urinal?: A Little Help from another person bathing (including washing, rinsing, drying)?: A Little Help from another person to put on and taking off regular upper body clothing?: None Help from another person to put on  and taking off regular lower body clothing?: A Little 6 Click Score: 20   End of Session Equipment Utilized During Treatment: Gait belt;Rolling walker (2 wheels)  Activity Tolerance: Patient tolerated treatment well Patient left: in chair;with call bell/phone within reach;with chair alarm set;with family/visitor present  OT Visit Diagnosis: Other abnormalities of gait and mobility (R26.89);Repeated falls (R29.6);Muscle weakness (generalized) (M62.81)                Time: 5051-0712 OT Time Calculation (min): 26 min Charges:  OT General Charges $OT Visit: 1 Visit OT Evaluation $OT Eval Moderate Complexity: 1 Mod OT Treatments $Self Care/Home Management  : 8-22 mins  Ardeth Perfect., MPH, MS, OTR/L ascom [phone] 11/04/20, 12:59 PM

## 2020-11-04 NOTE — Care Management Obs Status (Signed)
[facility] NOTIFICATION   Patient Details  Name: MASIAH WOODY MRN: [id number] Date of Birth: 09-05-1935   Medicare Observation Status Notification Given:  Yes    Shelbie Hutching, RN 11/04/2020, 1:12 PM

## 2020-11-04 NOTE — Evaluation (Signed)
Physical Therapy Evaluation Patient Details Name: Steve Ramirez MRN: [id number] DOB: 1935-10-22 Today's Date: 11/04/2020  History of Present Illness  Pt is an 85 y/o M who presented from home on 11/03/20 with c/o facial droop & L side weakness. Symptoms were transient. Of note, pt has family member who recently had meningitis. Pt admitted for further work-up. MRI on 11/02/20 showed no acute findings. PMH: multiple CVAs & TIAs, HTN, GI bleed, melanoma, DM, HLD  Clinical Impression  Pt seen for PT evaluation with daughter in law & son present & providing PLOF. Prior to admission pt ambulated with rollator in ALF but had ~5 falls in the past 6 months. On this date, pt is only AxO to self with PT providing education re: orientation. Pt is able to transfer & ambulate with RW & CGA<>Min assist but does demonstrate decreased attention to R side of environment & decreased safety awareness & slowed processing. Pt would benefit from HHPT f/u services upon return to his ALF. Of note, pt's family concerned about pt's impaired cognition & asked for SLP consult - notified MD of family's concerns & requests via secure chat.       Recommendations for follow up therapy are one component of a multi-disciplinary discharge planning process, led by the attending physician.  Recommendations may be updated based on patient status, additional functional criteria and insurance authorization.  Follow Up Recommendations Home health PT    Assistance Recommended at Discharge Frequent or constant Supervision/Assistance  Functional Status Assessment    Equipment Recommendations  Rolling walker (2 wheels)    Recommendations for Other Services       Precautions / Restrictions Precautions Precautions: Fall Restrictions Weight Bearing Restrictions: No      Mobility  Bed Mobility               General bed mobility comments: not observed, pt received & left sitting in recliner    Transfers Overall  transfer level: Needs assistance Equipment used: Rolling walker (2 wheels) Transfers: Sit to/from Stand Sit to Stand: Min assist           General transfer comment: cuing for safe hand placement during sit<>stand but poor return demo.    Ambulation/Gait Ambulation/Gait assistance: Min guard;Min assist Gait Distance (Feet): 160 Feet Assistive device: Rolling walker (2 wheels) Gait Pattern/deviations: Decreased step length - right;Decreased stride length;Decreased step length - left Gait velocity: decreased   General Gait Details: Pt frequently veering to R with PT providing assistance with maneuvering RW to center of hallway. Pt with very decreased gait speed, elects to increase speed at times but does not do it on command. Pt takes frequent standing pauses of his own volition. Appears to have some decreased R attention to environment.  Stairs            Wheelchair Mobility    Modified Rankin (Stroke Patients Only)       Balance Overall balance assessment: Needs assistance;History of Falls         Standing balance support: Bilateral upper extremity supported Standing balance-Leahy Scale: Poor Standing balance comment: BUE support on RW & Min assist                             Pertinent Vitals/Pain Pain Assessment: No/denies pain    Home Living Family/patient expects to be discharged to:: Assisted living Living Arrangements: Spouse/significant other Available Help at Discharge:  (ALF staff) Type of Home: Assisted living  Home Access: Level entry       Home Layout: One level        Prior Function               Mobility Comments: Per family, pt ambulated with a rollator, was just discharged from PT, but endorses at least 5 falls in the past 6 months. States it's not uncommon for pt to bump into wall in ALF.       Hand Dominance        Extremity/Trunk Assessment   Upper Extremity Assessment Upper Extremity Assessment: Generalized  weakness (brusing & old skin tears noted to RUE)    Lower Extremity Assessment Lower Extremity Assessment: Generalized weakness       Communication      Cognition Arousal/Alertness: Awake/alert Behavior During Therapy: WFL for tasks assessed/performed;Flat affect Overall Cognitive Status: Impaired/Different from baseline Area of Impairment: Orientation;Memory;Following commands;Awareness;Safety/judgement;Problem solving                 Orientation Level: Disoriented to;Place;Time;Situation (initially able to select "hospital" correctly from choice of 2, at end of session is able to report he's at [facility] but initially not oriented to location)   Memory: Decreased short-term memory Following Commands: Follows one step commands consistently;Follows multi-step commands with increased time Safety/Judgement: Decreased awareness of deficits;Decreased awareness of safety Awareness: Intellectual Problem Solving: Slow processing General Comments: Appears to have some R inattention as pt frequently veers R when ambulating in hallway with PT providing assistance to manevuer away from wall but pt repeatedly does this despite ongoing education/cuing. Discussed using signs, calendar for recall to assist pt once he returns to ALF.        General Comments      Exercises     Assessment/Plan    PT Assessment Patient needs continued PT services  PT Problem List Decreased strength;Decreased mobility;Decreased safety awareness;Decreased cognition;Decreased activity tolerance;Decreased balance;Decreased knowledge of use of DME       PT Treatment Interventions DME instruction;Therapeutic activities;Modalities;Cognitive remediation;Gait training;Therapeutic exercise;Patient/family education;Balance training;Functional mobility training;Neuromuscular re-education;Manual techniques    PT Goals (Current goals can be found in the Care Plan section)  Acute Rehab PT Goals Patient Stated Goal: get  better PT Goal Formulation: With family Time For Goal Achievement: 11/18/20 Potential to Achieve Goals: Fair    Frequency Min 2X/week   Barriers to discharge        Co-evaluation               AM-PAC PT "6 Clicks" Mobility  Outcome Measure Help needed turning from your back to your side while in a flat bed without using bedrails?: A Little Help needed moving from lying on your back to sitting on the side of a flat bed without using bedrails?: A Little Help needed moving to and from a bed to a chair (including a wheelchair)?: A Little Help needed standing up from a chair using your arms (e.g., wheelchair or bedside chair)?: A Little Help needed to walk in hospital room?: A Little Help needed climbing 3-5 steps with a railing? : A Lot 6 Click Score: 17    End of Session Equipment Utilized During Treatment: Gait belt Activity Tolerance: Patient tolerated treatment well Patient left: with chair alarm set;in chair;with family/visitor present;with call bell/phone within reach Nurse Communication: Mobility status PT Visit Diagnosis: Unsteadiness on feet (R26.81);Muscle weakness (generalized) (M62.81);History of falling (Z91.81)    Time: 1638-4665 PT Time Calculation (min) (ACUTE ONLY): 23 min   Charges:   PT Evaluation $  PT Eval Low Complexity: 1 Low PT Treatments $Therapeutic Activity: 8-22 mins        Lavone Nian, PT, DPT 11/04/20, 11:02 AM   Waunita Schooner 11/04/2020, 11:00 AM

## 2020-11-05 DIAGNOSIS — G459 Transient cerebral ischemic attack, unspecified: Secondary | ICD-10-CM | POA: Diagnosis not present

## 2020-11-05 DIAGNOSIS — G9341 Metabolic encephalopathy: Secondary | ICD-10-CM | POA: Diagnosis not present

## 2020-11-05 LAB — GLUCOSE, CAPILLARY
Glucose-Capillary: 114 mg/dL — ABNORMAL HIGH (ref 70–99)
Glucose-Capillary: 166 mg/dL — ABNORMAL HIGH (ref 70–99)

## 2020-11-05 MED ORDER — VITAMIN B-12 1000 MCG PO TABS: 1000.0000 ug | ORAL_TABLET | Freq: Every day | ORAL | 0 refills | Status: DC

## 2020-11-05 NOTE — NC FL2 (Signed)
[place] LEVEL OF CARE SCREENING TOOL     IDENTIFICATION  Patient Name: Steve Ramirez Birthdate: Jun 01, 1935 Sex: male Admission Date (Current Location): 11/03/2020  [facility] and [place] Number:  Engineering geologist and Address:  [facility], [address], [place], [place] [address]      Provider Number: [id number]  Attending Physician Name and Address:  Sharen Hones, MD  Relative Name and Phone Number:  Alexus Michael [phone]    Current Level of Care: Hospital Recommended Level of Care: [place] Prior Approval Number:    Date Approved/Denied:   PASRR Number:    Discharge Plan: Other (Comment) (Assited living)    Current Diagnoses: Patient Active Problem List   Diagnosis Date Noted   Depression 10/03/2020   Hypothyroidism 10/03/2020   CAD (coronary artery disease) 54/00/8676   Acute metabolic encephalopathy 19/50/9326   Primary osteoarthritis of left hip 09/14/2018   Primary localized osteoarthritis of hip 08/02/2018   Primary osteoarthritis of right hip 07/07/2018   Near syncope 12/03/2016   CVA (cerebral vascular accident) ([place]) 12/03/2016   Right hemiparesis ([place]) 12/02/2016   Acute ischemic stroke ([place]) 12/02/2016   Essential hypertension 12/02/2016   Dysphagia 12/02/2016   Acute CVA (cerebrovascular accident) ([place]) 09/13/2015   TIA (transient ischemic attack) 11/29/2014   Shoulder stiffness 09/05/2012   Right rotator cuff tear 08/16/2012   Shoulder subluxation, right 08/09/2012   Elevated transaminase level 11/05/2011   Coronary atherosclerosis of native coronary artery 11/26/2010   Essential hypertension, benign 11/23/2010   Diabetes mellitus without complication ([place]) 71/24/5809   Hyperlipidemia 11/23/2010   History of TIAs 11/23/2010    Orientation RESPIRATION BLADDER Height & Weight     Self, Situation, Place  Normal Continent Weight: 82.3 kg Height:  5\' 8"  (172.7 cm)   BEHAVIORAL SYMPTOMS/MOOD NEUROLOGICAL BOWEL NUTRITION STATUS      Continent Diet (low sodium, Heart healthy)  AMBULATORY STATUS COMMUNICATION OF NEEDS Skin   Supervision Verbally Normal                       Personal Care Assistance Level of Assistance  Bathing, Feeding, Dressing Bathing Assistance: Limited assistance Feeding assistance: Independent Dressing Assistance: Limited assistance     Functional Limitations Info  Sight, Hearing, Speech Sight Info: Impaired Hearing Info: Impaired Speech Info: Adequate    SPECIAL CARE FACTORS FREQUENCY  PT (By licensed PT)     PT Frequency: Home health with [facility] 2 times per week              Contractures Contractures Info: Not present    Additional Factors Info  Code Status, Allergies Code Status Info: Full Allergies Info: NKA           Current Medications (11/05/2020):  This is the current hospital active medication list Current Facility-Administered Medications  Medication Dose Route Frequency Provider Last Rate Last Admin   acetaminophen (TYLENOL) tablet 650 mg  650 mg Oral Q4H PRN Elwyn Reach, MD       Or   acetaminophen (TYLENOL) 160 MG/5ML solution 650 mg  650 mg Per Tube Q4H PRN Elwyn Reach, MD       Or   acetaminophen (TYLENOL) suppository 650 mg  650 mg Rectal Q4H PRN Elwyn Reach, MD       aspirin EC tablet 81 mg  81 mg Oral Daily Bhagat, Srishti L, MD   81 mg at 11/05/20 1038   [START ON  11/09/2020] cloNIDine (CATAPRES - Dosed in mg/24 hr) patch 0.3 mg  0.3 mg Transdermal Q Sat Garba, Mohammad L, MD       clopidogrel (PLAVIX) tablet 75 mg  75 mg Oral Daily Elwyn Reach, MD   75 mg at 11/05/20 1038   cyanocobalamin ((VITAMIN B-12)) injection 1,000 mcg  1,000 mcg Intramuscular Daily Sharen Hones, MD   1,000 mcg at 11/04/20 1311   enoxaparin (LOVENOX) injection 40 mg  40 mg Subcutaneous Q24H Gala Romney L, MD   40 mg at 11/04/20 2037   escitalopram (LEXAPRO) tablet 20 mg  20 mg  Oral Daily Gala Romney L, MD   20 mg at 11/04/20 1059   ferrous sulfate tablet 325 mg  325 mg Oral Q breakfast Gala Romney L, MD   325 mg at 11/05/20 1038   insulin aspart (novoLOG) injection 0-15 Units  0-15 Units Subcutaneous TID WC Elwyn Reach, MD   3 Units at 11/04/20 1823   insulin aspart (novoLOG) injection 0-5 Units  0-5 Units Subcutaneous QHS Elwyn Reach, MD       isosorbide mononitrate (IMDUR) 24 hr tablet 30 mg  30 mg Oral QPM Gala Romney L, MD   30 mg at 11/04/20 1824   levothyroxine (SYNTHROID) tablet 50 mcg  50 mcg Oral Q0600 Elwyn Reach, MD   50 mcg at 11/05/20 0514   lisinopril (ZESTRIL) tablet 40 mg  40 mg Oral Daily Elwyn Reach, MD   40 mg at 11/04/20 1311   loratadine (CLARITIN) tablet 10 mg  10 mg Oral Daily Gala Romney L, MD   10 mg at 11/05/20 1038   metoprolol succinate (TOPROL-XL) 24 hr tablet 25 mg  25 mg Oral Q supper Elwyn Reach, MD   25 mg at 11/04/20 1312   multivitamin with minerals tablet 1 tablet  1 tablet Oral Daily Elwyn Reach, MD   1 tablet at 11/05/20 1038   nitroGLYCERIN (NITROSTAT) SL tablet 0.4 mg  0.4 mg Sublingual Q5 min PRN Elwyn Reach, MD       omega-3 acid ethyl esters (LOVAZA) capsule 1 g  1 g Oral BID Gala Romney L, MD   1 g at 11/05/20 1038   pravastatin (PRAVACHOL) tablet 20 mg  20 mg Oral QPM Gala Romney L, MD   20 mg at 11/04/20 1824   rOPINIRole (REQUIP) tablet 1 mg  1 mg Oral QHS Gala Romney L, MD   1 mg at 11/04/20 2037   senna-docusate (Senokot-S) tablet 1 tablet  1 tablet Oral QHS PRN Elwyn Reach, MD       sodium chloride flush (NS) 0.9 % injection 3 mL  3 mL Intravenous Once Vanessa Burnham, MD       spironolactone (ALDACTONE) tablet 25 mg  25 mg Oral Lerry Paterson L, MD   25 mg at 11/04/20 1824     Discharge Medications: Medication List       TAKE these medications     aspirin EC 81 MG tablet Take 1 tablet (81 mg total) by mouth 2 (two) times daily. For  DVT prophylaxis for 30 days after surgery.    cetirizine 10 MG tablet Commonly known as: ZYRTEC Take 10 mg by mouth daily.    cloNIDine 0.3 mg/24hr patch Commonly known as: CATAPRES - Dosed in mg/24 hr Place 1 patch (0.3 mg total) onto the skin once a week. What changed: when to take this    clopidogrel 75  MG tablet Commonly known as: PLAVIX Take 1 tablet (75 mg total) by mouth daily.    escitalopram 20 MG tablet Commonly known as: LEXAPRO Take 20 mg by mouth daily. What changed: Another medication with the same name was removed. Continue taking this medication, and follow the directions you see here.    ferrous sulfate 325 (65 FE) MG tablet Take 325 mg by mouth daily with breakfast.    Fish Oil 1200 MG Caps Take 1,200 mg by mouth daily.    isosorbide mononitrate 30 MG 24 hr tablet Commonly known as: IMDUR Take 30 mg by mouth every evening.    levothyroxine 50 MCG tablet Commonly known as: SYNTHROID Take 1 tablet (50 mcg total) by mouth daily. What changed: when to take this    lisinopril 40 MG tablet Commonly known as: ZESTRIL Take 1 tablet (40 mg total) by mouth daily.    metFORMIN 1000 MG tablet Commonly known as: GLUCOPHAGE Take 1,000 mg by mouth 2 (two) times daily. What changed: Another medication with the same name was removed. Continue taking this medication, and follow the directions you see here.    metoprolol succinate 25 MG 24 hr tablet Commonly known as: TOPROL-XL Take 25 mg by mouth every evening.    multivitamin tablet Take 1 tablet by mouth daily.    nitroGLYCERIN 0.4 MG SL tablet Commonly known as: NITROSTAT Place 0.4 mg under the tongue every 5 (five) minutes as needed for chest pain.    pravastatin 20 MG tablet Commonly known as: PRAVACHOL Take 20 mg by mouth every evening.    rOPINIRole 0.5 MG tablet Commonly known as: REQUIP Take 2 tablets (1 mg total) by mouth at bedtime.    spironolactone 25 MG tablet Commonly known as:  ALDACTONE Take 1 tablet (25 mg total) by mouth daily. What changed: when to take this    vitamin B-12 1000 MCG tablet Commonly known as: CYANOCOBALAMIN Take 1 tablet (1,000 mcg total) by mouth daily.     Please see discharge summary for a list of discharge medications.  Relevant Imaging Results:  Relevant Lab Results:   Additional Information    Shelbie Hutching, RN

## 2020-11-05 NOTE — TOC Transition Note (Signed)
Transition of Care [facility]) - CM/SW Discharge Note   Patient Details  Name: Steve Ramirez MRN: [id number] Date of Birth: [birth year]  [facility] CM/SW Contact:  Shelbie Hutching, RN Phone Number: 11/05/2020, 11:18 AM   Clinical Narrative:    Patient is medically cleared for discharge back to [facility].  RNCM has spoken with Lattie Haw at [facility] and patient is good to return today.  Family will transport.  FL2 faxed to [facility] at [phone].     Final next level of care: Assisted Living Barriers to Discharge: Barriers Resolved   Patient Goals and CMS Choice Patient states their goals for this hospitalization and ongoing recovery are:: Patient would like to go back to [facility] ALF CMS Medicare.gov Compare Post Acute Care list provided to:: Patient Choice offered to / list presented to : Patient, Adult Children  Discharge Placement              Patient chooses bed at: Other - please specify in the comment section below: ([facility]) Patient to be transferred to facility by: Family Name of family member notified: Jeannett Senior and [name] Patient and family notified of of transfer: 11/05/20  Discharge Plan and Services   Discharge Planning Services: CM Consult            DME Arranged: N/A DME Agency: NA       HH Arranged: PT HH Agency: [place] Date [place]: 11/05/20 Time [place]: 1117 Representative spoke with at [place]: [place] Determinants of Health ([place]) Interventions     Readmission Risk Interventions No flowsheet data found.

## 2020-11-05 NOTE — Discharge Summary (Signed)
Physician Discharge Summary  Patient ID: Steve Ramirez MRN: [id number] DOB/AGE: 1935-08-27 85 y.o.  Admit date: 11/03/2020 Discharge date: 11/05/2020  Admission Diagnoses:  Discharge Diagnoses:  Principal Problem:   TIA (transient ischemic attack) Active Problems:   Diabetes mellitus without complication ([place])   Hyperlipidemia   Essential hypertension   Hypothyroidism   CAD (coronary artery disease)   Acute metabolic encephalopathy   Discharged Condition: good  Hospital Course:  Steve NOONE is a 85 y.o. male with medical history significant of multiple CVAs and TIAs, essential hypertension, GI bleed, history of melanoma and diabetes, hyperlipidemia who was brought in from home for facial droop and weakness on the left.  Symptoms were transient.  Patient was evaluated by neurology, concerning for TIA versus a seizure.  EEG is pending. 10/24.  Patient continued to have a secondary confusion, not at baseline. 10/25, slept well last night, no additional confusion.  UA negative.  Medically stable to be discharged  Acute encephalopathy. TIA EEG ruled out seizure. Patient has been evaluated by neurology, no need for additional stroke work-up as patient just finished tests last months. B12 level is borderline low, received B12 injection.  Continue oral treatment.  TSH level normal. UA has no evidence of UTI. Patient has no additional dysphagia after mental status improves.      Type 2 diabetes. Resume home treatment.  Coronary artery disease. Essential hypertension. Resume outpatient treatment.  Consults: neurology  Significant Diagnostic Studies:  EEG: Impression and clinical correlation: This EEG was obtained while awake and asleep and is abnormal due to mild diffuse slowing. There were no epileptiform abnl observed during this recording.   Su Monks, MD [facility]  MRI No acute finding. Chronic small-vessel ischemic changes of  the cerebral hemispheric white matter. Old lacunar infarctions left thalamus and posterior limb internal capsule.     Electronically Signed   By: Steve Ramirez M.D.   On: 11/03/2020 16:40  Treatments: Clinical monitoring  Discharge Exam: Blood pressure (!) 177/87, pulse 68, temperature 98 F (36.7 C), resp. rate 16, height 5\' 8"  (1.727 m), weight 82.3 kg, SpO2 96 %. General appearance: alert and cooperative Resp: clear to auscultation bilaterally Cardio: regular rate and rhythm, S1, S2 normal, no murmur, click, rub or gallop GI: soft, non-tender; bowel sounds normal; no masses,  no organomegaly Extremities: extremities normal, atraumatic, no cyanosis or edema  Disposition: Discharge disposition: 01-Home or Self Care       Discharge Instructions     Diet - low sodium heart healthy   Complete by: As directed    Increase activity slowly   Complete by: As directed       Allergies as of 11/05/2020   No Known Allergies      Medication List     TAKE these medications    aspirin EC 81 MG tablet Take 1 tablet (81 mg total) by mouth 2 (two) times daily. For DVT prophylaxis for 30 days after surgery.   cetirizine 10 MG tablet Commonly known as: ZYRTEC Take 10 mg by mouth daily.   cloNIDine 0.3 mg/24hr patch Commonly known as: CATAPRES - Dosed in mg/24 hr Place 1 patch (0.3 mg total) onto the skin once a week. What changed: when to take this   clopidogrel 75 MG tablet Commonly known as: PLAVIX Take 1 tablet (75 mg total) by mouth daily.   escitalopram 20 MG tablet Commonly known as: LEXAPRO Take 20 mg by mouth daily. What changed: Another medication with the same  name was removed. Continue taking this medication, and follow the directions you see here.   ferrous sulfate 325 (65 FE) MG tablet Take 325 mg by mouth daily with breakfast.   Fish Oil 1200 MG Caps Take 1,200 mg by mouth daily.   isosorbide mononitrate 30 MG 24 hr tablet Commonly known as:  IMDUR Take 30 mg by mouth every evening.   levothyroxine 50 MCG tablet Commonly known as: SYNTHROID Take 1 tablet (50 mcg total) by mouth daily. What changed: when to take this   lisinopril 40 MG tablet Commonly known as: ZESTRIL Take 1 tablet (40 mg total) by mouth daily.   metFORMIN 1000 MG tablet Commonly known as: GLUCOPHAGE Take 1,000 mg by mouth 2 (two) times daily. What changed: Another medication with the same name was removed. Continue taking this medication, and follow the directions you see here.   metoprolol succinate 25 MG 24 hr tablet Commonly known as: TOPROL-XL Take 25 mg by mouth every evening.   multivitamin tablet Take 1 tablet by mouth daily.   nitroGLYCERIN 0.4 MG SL tablet Commonly known as: NITROSTAT Place 0.4 mg under the tongue every 5 (five) minutes as needed for chest pain.   pravastatin 20 MG tablet Commonly known as: PRAVACHOL Take 20 mg by mouth every evening.   rOPINIRole 0.5 MG tablet Commonly known as: REQUIP Take 2 tablets (1 mg total) by mouth at bedtime.   spironolactone 25 MG tablet Commonly known as: ALDACTONE Take 1 tablet (25 mg total) by mouth daily. What changed: when to take this   vitamin B-12 1000 MCG tablet Commonly known as: CYANOCOBALAMIN Take 1 tablet (1,000 mcg total) by mouth daily.        Follow-up Information     Sharilyn Sites, MD Follow up in 1 week(s).   Specialty: Family Medicine Contact information: [address] [place] [address] [phone]         [facility] Follow up in 3 week(s).   Contact information: [place] [phone]                Signed: Sharen Hones 11/05/2020, 9:51 AM

## 2020-11-05 NOTE — Evaluation (Signed)
Clinical/Bedside Swallow Evaluation Patient Details  Name: Steve Ramirez MRN: [id number] Date of Birth: [birth year]  Today's Date: 11/05/2020 Time: SLP Start Time (ACUTE ONLY): 1645 SLP Stop Time (ACUTE ONLY): 1730 SLP Time Calculation (min) (ACUTE ONLY): 45 min  Past Medical History:  Past Medical History:  Diagnosis Date   Coronary atherosclerosis of native coronary artery    Essential hypertension, benign    GIB (gastrointestinal bleeding)    Colonic diverticular bleed   History of melanoma    History of TIAs    Stroke ([place])    Type 2 diabetes mellitus ([place])    Past Surgical History:  Past Surgical History:  Procedure Laterality Date   COLONOSCOPY  08/13/2010   Procedure: COLONOSCOPY;  Surgeon: Rogene Houston, MD;  [facility];  Service: Endoscopy;  Laterality: N/A;   JOINT REPLACEMENT     KNEE SURGERY     arthroscopic   LEFT HEART CATHETERIZATION WITH CORONARY ANGIOGRAM N/A 11/24/2010   Procedure: LEFT HEART CATHETERIZATION WITH CORONARY ANGIOGRAM;  Surgeon: Josue Hector, MD;  Location: [facility] CATH LAB;  Service: Cardiovascular;  Laterality: N/A;   neck fusion     years ago   TOTAL HIP ARTHROPLASTY Right 08/02/2018   Procedure: TOTAL HIP ARTHROPLASTY ANTERIOR APPROACH;  Surgeon: Renette Butters, MD;  Location: [facility] ORS;  Service: Orthopedics;  Laterality: Right;   TOTAL HIP ARTHROPLASTY Left 10/18/2018   Procedure: TOTAL HIP ARTHROPLASTY ANTERIOR APPROACH;  Surgeon: Renette Butters, MD;  Location: [facility] ORS;  Service: Orthopedics;  Laterality: Left;   HPI:  Pt is a 85 y.o. male with medical history significant of multiple CVAs and TIAs, essential hypertension, GI bleed, history of melanoma and diabetes, hyperlipidemia who was brought in from home for facial droop and weakness on the left.  Symptoms were transient, resolved.  Pt was initially sleepy/drowsy w/ some change in mental status.  Patient was evaluated by neurology, concerning for TIA versus a seizure.   CXR: bronchitic changes.  MRI/Head CT: no acute findings; Atrophy and extensive chronic small-vessel  ischemic changes. Old lacunar infarctions left thalamus and  posterior limb internal capsule.    Assessment / Plan / Recommendation  Clinical Impression  Pt appears to present w/ adequate oropharyngeal phase swallow function w/ No oropharyngeal phase dysphagia noted, No neuromuscular deficits noted. Pt consumed po trials during his Dinner meal w/ No overt, clinical s/s of aspiration during po trials. Pt appears at reduced risk for aspiration following general aspiration precautions.     During po trials, pt consumed all consistencies w/ no overt coughing, decline in vocal quality, or change in respiratory presentation during/post trials. Oral phase appeared [facility] w/ timely bolus management, mastication, and control of bolus propulsion for A-P transfer for swallowing. Oral clearing achieved w/ all trial consistencies. OM Exam appeared [facility] w/ no unilateral weakness noted. Speech Clear. Pt fed self w/ setup support.     Recommend a Regular consistency diet w/ well-Cut meats, moistened foods; Thin liquids. Recommend general aspiration precautions, Pills WHOLE in Puree for safer, easier swallowing if needed. Education given on Pills in Puree; food consistencies and easy to eat options; general aspiration precautions. NSG to reconsult if any new needs arise. NSG agreed. SLP Visit Diagnosis: Dysphagia, unspecified (R13.10)    Aspiration Risk   (reduced following general aspiration precautions)    Diet Recommendation   Regular consistency diet w/ well-Cut meats, moistened foods; Thin liquids. Recommend general aspiration precautions  Medication Administration: Whole meds with liquid (or in  a Puree if easier for swallowing)    Other  Recommendations Recommended Consults:  (n/a) Oral Care Recommendations: Oral care BID;Oral care before and after PO;Patient independent with oral care Other Recommendations:   (n/a)    Recommendations for follow up therapy are one component of a multi-disciplinary discharge planning process, led by the attending physician.  Recommendations may be updated based on patient status, additional functional criteria and insurance authorization.  Follow up Recommendations None      Frequency and Duration  (n/a)   (n/a)       Prognosis Prognosis for Safe Diet Advancement: Good      Swallow Study   General Date of Onset: 11/03/20 HPI: Pt is a 85 y.o. male with medical history significant of multiple CVAs and TIAs, essential hypertension, GI bleed, history of melanoma and diabetes, hyperlipidemia who was brought in from home for facial droop and weakness on the left.  Symptoms were transient, resolved.  Pt was initially sleepy/drowsy w/ some change in mental status.  Patient was evaluated by neurology, concerning for TIA versus a seizure.  CXR: bronchitic changes.  MRI/Head CT: no acute findings; Atrophy and extensive chronic small-vessel  ischemic changes. Old lacunar infarctions left thalamus and  posterior limb internal capsule. Type of Study: Bedside Swallow Evaluation Previous Swallow Assessment: 11/2016 - BSE; Cog-linguistic evals also Diet Prior to this Study: Regular;Thin liquids Temperature Spikes Noted: No Respiratory Status: Room air History of Recent Intubation: No Behavior/Cognition: Alert;Cooperative;Pleasant mood;Distractible;Requires cueing Oral Cavity Assessment: Within Functional Limits Oral Care Completed by SLP: Recent completion by staff Oral Cavity - Dentition: Adequate natural dentition Vision: Functional for self-feeding Self-Feeding Abilities: Able to feed self;Needs set up (min) Patient Positioning: Upright in bed (encouraged more upright sitting) Baseline Vocal Quality: Normal Volitional Cough: Strong Volitional Swallow: Able to elicit    Oral/Motor/Sensory Function Overall Oral Motor/Sensory Function: Within functional limits   Ice  Chips Ice chips: Not tested   Thin Liquid Thin Liquid: Within functional limits Presentation: Cup;Self Fed (10+ trials)    Nectar Thick Nectar Thick Liquid: Not tested   Honey Thick Honey Thick Liquid: Not tested   Puree Puree: Within functional limits Presentation: Spoon;Self Fed (3 trials)   Solid     Solid: Within functional limits Presentation: Self Fed;Spoon (~25+% of Dinner meal)        Orinda Kenner, MS, [employer] Speech Language Pathologist Rehab Services [phone] [place] 11/05/2020,10:09 AM

## 2020-11-05 NOTE — Evaluation (Signed)
Speech Language Pathology Evaluation Patient Details Name: Steve Ramirez MRN: [id number] DOB: 1935/11/04 Today's Date: 11/05/2020 Time: 3474-2595 SLP Time Calculation (min) (ACUTE ONLY): 40 min  Problem List:  Patient Active Problem List   Diagnosis Date Noted   Depression 10/03/2020   Hypothyroidism 10/03/2020   CAD (coronary artery disease) 63/87/5643   Acute metabolic encephalopathy 32/95/1884   Primary osteoarthritis of left hip 09/14/2018   Primary localized osteoarthritis of hip 08/02/2018   Primary osteoarthritis of right hip 07/07/2018   Near syncope 12/03/2016   CVA (cerebral vascular accident) ([place]) 12/03/2016   Right hemiparesis ([place]) 12/02/2016   Acute ischemic stroke ([place]) 12/02/2016   Essential hypertension 12/02/2016   Dysphagia 12/02/2016   Acute CVA (cerebrovascular accident) ([place]) 09/13/2015   TIA (transient ischemic attack) 11/29/2014   Shoulder stiffness 09/05/2012   Right rotator cuff tear 08/16/2012   Shoulder subluxation, right 08/09/2012   Elevated transaminase level 11/05/2011   Coronary atherosclerosis of native coronary artery 11/26/2010   Essential hypertension, benign 11/23/2010   Diabetes mellitus without complication ([place]) 16/60/6301   Hyperlipidemia 11/23/2010   History of TIAs 11/23/2010   Past Medical History:  Past Medical History:  Diagnosis Date   Coronary atherosclerosis of native coronary artery    Essential hypertension, benign    GIB (gastrointestinal bleeding)    Colonic diverticular bleed   History of melanoma    History of TIAs    Stroke ([place])    Type 2 diabetes mellitus ([place])    Past Surgical History:  Past Surgical History:  Procedure Laterality Date   COLONOSCOPY  08/13/2010   Procedure: COLONOSCOPY;  Surgeon: Rogene Houston, MD;  [facility];  Service: Endoscopy;  Laterality: N/A;   JOINT REPLACEMENT     KNEE SURGERY     arthroscopic   LEFT HEART CATHETERIZATION WITH CORONARY ANGIOGRAM N/A  11/24/2010   Procedure: LEFT HEART CATHETERIZATION WITH CORONARY ANGIOGRAM;  Surgeon: Josue Hector, MD;  Location: [facility] CATH LAB;  Service: Cardiovascular;  Laterality: N/A;   neck fusion     years ago   TOTAL HIP ARTHROPLASTY Right 08/02/2018   Procedure: TOTAL HIP ARTHROPLASTY ANTERIOR APPROACH;  Surgeon: Renette Butters, MD;  Location: [facility];  Service: Orthopedics;  Laterality: Right;   TOTAL HIP ARTHROPLASTY Left 10/18/2018   Procedure: TOTAL HIP ARTHROPLASTY ANTERIOR APPROACH;  Surgeon: Renette Butters, MD;  Location: [facility];  Service: Orthopedics;  Laterality: Left;   HPI:  Pt is a 85 y.o. male with medical history significant of multiple CVAs and TIAs, essential hypertension, GI bleed, history of melanoma and diabetes, hyperlipidemia who was brought in from home for facial droop and weakness on the left.  Symptoms were transient, resolved.  Pt was initially sleepy/drowsy w/ some change in mental status.  Patient was evaluated by neurology, concerning for TIA versus a seizure.  CXR: bronchitic changes.  MRI/Head CT: no acute findings; Atrophy and extensive chronic small-vessel  ischemic changes. Old lacunar infarctions left thalamus and  posterior limb internal capsule.  Pt had a recent admit on 09/2020 w/ Cognitive-linguistic evaluation indicating Mild deficits w/ Cognitive-linguistic tasks w/ recommendation for f/u w/ Neurology and tx services then.  Pt lives a an [place] w/ some ADLs provided for him now.   Assessment / Plan / Recommendation Clinical Impression  Patient completed informal Cognitive-linguistic evaluation today using parts of the Bedside Western Aphasia Battery (B-WAB) and various Cognitive tasks. Pt appears to present w a degree of Cognitive-linguistic decline; possible impact  of mild Cognitive decline on his language abilities. See Neurology consult/note; MRI results.      Content and Fluency of speech was grossly adequate w basic tasks though pt tended to focus on  stating details of the picture and required min verbal cues to elaborate especially when noting probelm situations; Auditory Verbal Comprehension (7/10) - errors increased as quiestions became more complex; Sequential Commands (6/10) - errors increased w multistep commands; Repetition (8/10) - errors as pt deviated from details of phrase; Naming (10/10). This presentation reveals a degree of expressive/receptive language skills deficits seen in more complex language tasks. This presentation may be impacted by pt's declined Cognitive performance during tasks: noted deficits in Fluency, Delayed Recall, Attention, and Executive Function. Pt exhibited deficits w/ Orientation -- he self-corrected often. Calculation and Naming were appropriate. No verbal Apraxia, no Dysarthria, and no Motor Speech deficits were noted. Of note, pt exhibited min slower processing during his verbal engagements but w/ Time, he engaged appropriately and answered basic/general questions. Patient's speech was c/b tangential information; sometimes excessive and unnecessary information. Pt engaged in frequent verbal output, chuckled often during engagments. Reducing distractions was beneficial for pt to maintain topic. Pt may demonstrate a degree of Cognitive-Linguistic deficits d/t suspected, Baseline Cognitive decline per Neurologist's note in chart. HOWEVER, when engaged in general conversation re: Neurology has recommended f/u at Discharge for Formal Cognitive assessment w/ Neurology consult to further assess and determine pt's Baseline Cognitive functioning.       SLP provided education re: importance of reducing distractions during converation and Supervision for safety w/ ADLs and further skilled ST services to address any Cognitive-linguistic related strategies to increase overall safety awareness w/ ADLs, communication effectiveness, and quality of life.    SLP Assessment  SLP Recommendation/Assessment: All further Speech Lanaguage  Pathology  needs can be addressed in the next venue of care (f/u w/ Neurology for full Cognitive assessment to determine Baseline) SLP Visit Diagnosis: Cognitive communication deficit (R41.841) (mild)    Recommendations for follow up therapy are one component of a multi-disciplinary discharge planning process, led by the attending physician.  Recommendations may be updated based on patient status, additional functional criteria and insurance authorization.    Follow Up Recommendations  Outpatient SLP (TBD post Neurology f/u)    Frequency and Duration  (TBD)   (TBD)      SLP Evaluation Cognition  Overall Cognitive Status: History of cognitive impairments - at baseline (see last eval on 09/2020) Arousal/Alertness: Awake/alert Orientation Level: Oriented to person;Oriented to place;Oriented to situation (incorrect year, day) Year: 2020 Month: October Day of Week: Incorrect Attention: Focused;Sustained Focused Attention: Impaired Focused Attention Impairment: Verbal complex;Functional complex Sustained Attention: Impaired Sustained Attention Impairment: Verbal complex;Functional complex Memory: Impaired Memory Impairment: Decreased recall of new information (immediate) Immediate Memory Recall: Blue;Bed Memory Recall Sock: Not able to recall Memory Recall Blue: With Cue Memory Recall Bed: With Cue Awareness: Impaired Awareness Impairment: Anticipatory impairment Problem Solving: Appears intact (concrete responses- needed cues to elaborate w/ details) Executive Function: Reasoning Reasoning: Impaired Reasoning Impairment: Verbal complex;Functional complex (needed cues to elaborate more) Behaviors: Confabulation (mild; often told Jokes and gave "filler" information b/f answering questions) Safety/Judgment: Appears intact (for concrete situations)       Comprehension  Auditory Comprehension Overall Auditory Comprehension: Appears within functional limits for tasks assessed Reading  Comprehension Reading Status: Within funtional limits (menu)    Expression Expression Primary Mode of Expression: Verbal Verbal Expression Overall Verbal Expression: Appears within functional limits for tasks assessed Written  Expression Dominant Hand: Right Written Expression: Not tested   Oral / Motor  Oral Motor/Sensory Function Overall Oral Motor/Sensory Function: Within functional limits Motor Speech Overall Motor Speech: Appears within functional limits for tasks assessed Respiration: Within functional limits Phonation: Normal Resonance: Within functional limits Articulation: Within functional limitis Intelligibility: Intelligible Motor Planning: Witnin functional limits Motor Speech Errors: Not applicable   GO                      Orinda Kenner, [place], CCC-SLP Speech Language Pathologist Rehab Services [phone] [facility] 11/05/2020, 10:24 AM

## 2020-11-07 LAB — VITAMIN B1: Vitamin B1 (Thiamine): 197.7 nmol/L (ref 66.5–200.0)

## 2020-11-08 ENCOUNTER — Other Ambulatory Visit: Payer: Self-pay

## 2020-11-08 ENCOUNTER — Emergency Department
Admission: EM | Admit: 2020-11-08 | Discharge: 2020-11-08 | Disposition: A | Discharge status: Home / Self Care | Payer: Medicare Other | Source: Home / Self Care | Attending: Emergency Medicine | Admitting: Emergency Medicine

## 2020-11-08 ENCOUNTER — Emergency Department: Payer: Medicare Other

## 2020-11-08 DIAGNOSIS — R4182 Altered mental status, unspecified: Secondary | ICD-10-CM | POA: Diagnosis not present

## 2020-11-08 DIAGNOSIS — R531 Weakness: Secondary | ICD-10-CM | POA: Insufficient documentation

## 2020-11-08 DIAGNOSIS — Z20822 Contact with and (suspected) exposure to covid-19: Secondary | ICD-10-CM | POA: Insufficient documentation

## 2020-11-08 DIAGNOSIS — G9341 Metabolic encephalopathy: Secondary | ICD-10-CM | POA: Diagnosis not present

## 2020-11-08 LAB — CBC
HCT: 40.8 % (ref 39.0–52.0)
Hemoglobin: 12.5 g/dL — ABNORMAL LOW (ref 13.0–17.0)
MCH: 22.2 pg — ABNORMAL LOW (ref 26.0–34.0)
MCHC: 30.6 g/dL (ref 30.0–36.0)
MCV: 72.6 fL — ABNORMAL LOW (ref 80.0–100.0)
Platelets: 287 10*3/uL (ref 150–400)
RBC: 5.62 MIL/uL (ref 4.22–5.81)
RDW: 20.1 % — ABNORMAL HIGH (ref 11.5–15.5)
WBC: 5.9 10*3/uL (ref 4.0–10.5)
nRBC: 0 % (ref 0.0–0.2)

## 2020-11-08 LAB — COMPREHENSIVE METABOLIC PANEL
ALT: 18 U/L (ref 0–44)
AST: 17 U/L (ref 15–41)
Albumin: 4.6 g/dL (ref 3.5–5.0)
Alkaline Phosphatase: 61 U/L (ref 38–126)
Anion gap: 6 (ref 5–15)
BUN: 20 mg/dL (ref 8–23)
CO2: 24 mmol/L (ref 22–32)
Calcium: 9.7 mg/dL (ref 8.9–10.3)
Chloride: 109 mmol/L (ref 98–111)
Creatinine, Ser: 0.75 mg/dL (ref 0.61–1.24)
GFR, Estimated: >60 mL/min (ref >60)
Glucose, Bld: 120 mg/dL — ABNORMAL HIGH (ref 70–99)
Potassium: 4.5 mmol/L (ref 3.5–5.1)
Sodium: 139 mmol/L (ref 135–145)
Total Bilirubin: 0.8 mg/dL (ref 0.3–1.2)
Total Protein: 7.5 g/dL (ref 6.5–8.1)

## 2020-11-08 LAB — DIFFERENTIAL
Abs Immature Granulocytes: 0.01 10*3/uL (ref 0.00–0.07)
Basophils Absolute: 0.1 10*3/uL (ref 0.0–0.1)
Basophils Relative: 1 %
Eosinophils Absolute: 0.3 10*3/uL (ref 0.0–0.5)
Eosinophils Relative: 6 %
Immature Granulocytes: 0 %
Lymphocytes Relative: 25 %
Lymphs Abs: 1.5 10*3/uL (ref 0.7–4.0)
Monocytes Absolute: 0.6 10*3/uL (ref 0.1–1.0)
Monocytes Relative: 10 %
Neutro Abs: 3.4 10*3/uL (ref 1.7–7.7)
Neutrophils Relative %: 58 %

## 2020-11-08 LAB — PROTIME-INR
INR: 1.1 (ref 0.8–1.2)
Prothrombin Time: 13.7 seconds (ref 11.4–15.2)

## 2020-11-08 LAB — APTT: aPTT: 29 seconds (ref 24–36)

## 2020-11-08 LAB — RESP PANEL BY RT-PCR (FLU A&B, COVID) ARPGX2
Influenza A by PCR: NEGATIVE
Influenza B by PCR: NEGATIVE
SARS Coronavirus 2 by RT PCR: NEGATIVE

## 2020-11-08 LAB — ETHANOL: Alcohol, Ethyl (B): <10 mg/dL (ref <10)

## 2020-11-08 NOTE — ED Notes (Signed)
Dc ppw provided. Followup given. Pt IV removed. Assisted to [facility] via wheelchair with son at side,

## 2020-11-08 NOTE — ED Notes (Signed)
Pt son at bedside, pt son states that pt had follow up with neurology on Wednesday and they said that they think pts stroke like symptoms are coming from blood pressure issues.   Pt reports that today he had weakness in his right leg, son also reports that the facility also reported some confusion. Pt reports that his version of what went on is different from the facilities. Pt reports that last night he was holding a cup in his right hand and he dropped it, splashing water in his face. Pt states that this morning he was laying across the bed and helped his wife get dress and then 3 EMS workers came in and told him they thought he was having a stroke.   Pt does not have slurred speech at this time. Pt denies weakness in right leg at this time. Pt states that the weakness only lasted about 5 minutes and then it went away.

## 2020-11-08 NOTE — Discharge Instructions (Signed)
It is possible that you had another TIA.  Your neurologic exam today is normal indicating that it is very unlikely that you had a stroke.  Your recent work-up in the hospital for TIA was reassuring so do not feel that we need to repeat this today.  Your blood work and CT today were normal.  Please follow-up with your neurologist as well as your primary care provider.  If you have any new neurologic symptoms, please return to the emergency department.

## 2020-11-08 NOTE — ED Notes (Signed)
Pt eating pop tart when called back to be taken to treatment room. Pt is not having any difficulty swallowing.

## 2020-11-08 NOTE — ED Triage Notes (Signed)
Pt here via [facility] from [facility] with weakness that started this morning at 8 am. Pt states that he woke up with his right arm feeling weak and numb. Pt denies HA, N,V.

## 2020-11-08 NOTE — ED Provider Notes (Signed)
[facility]  ____________________________________________   Event Date/Time   First MD Initiated Contact with Patient 11/08/20 1458     (approximate)  I have reviewed the triage vital signs and the nursing notes.   HISTORY  Chief Complaint Weakness    HPI Steve Ramirez is a 85 y.o. male multiple CVAs and TIAs, essential hypertension, GI bleed, history of melanoma and diabetes, hyperlipidemia who presents after an episode of weakness.  Patient currently on my evaluation does not remember having any weakness today.  His son says that he will often have some memory loss associated with his TIAs.  Tells me he has had 7-8 TIAs in the past most recently was discharged just 3 days ago from the hospital after a presumed TIA.  Per chart review looks like he had had some right extremity weakness today along with confusion and the nursing facility was concerned so called EMS and patient was brought to the ED.  Per EMS initially he was somewhat confused with difficulty with speech, however this is now resolved.  Patient denies any complaints.  Denies headache nausea vomiting dizziness.  Denies numbness, weakness or difficulty with speech.  Son notes that he has had some difficulty with balance which has been somewhat progressive for him and seems to be worse every time he leaves the hospital.  During his last hospitalization he had an MRI without acute findings but did show old lacunar infarctions in the left thalamus and posterior limb of the internal capsule.  Prior to that in September on September 22 he had MR angio that showed an unchanged focal stenosis of the right P2 segment but otherwise patent intracranial vasculature.  Ultrasound of the carotid did not show any significant stenosis.  He also had an echo with bubble study that was not concerning.  In his most recent admission on 10/24 he had an EEG that did not show any seizure activity.  Patient is on aspirin and  Plavix for stroke prevention.        Past Medical History:  Diagnosis Date   Coronary atherosclerosis of native coronary artery    Essential hypertension, benign    GIB (gastrointestinal bleeding)    Colonic diverticular bleed   History of melanoma    History of TIAs    Stroke [facility])    Type 2 diabetes mellitus ([place])     Patient Active Problem List   Diagnosis Date Noted   Depression 10/03/2020   Hypothyroidism 10/03/2020   CAD (coronary artery disease) 60/63/0160   Acute metabolic encephalopathy 10/93/2355   Primary osteoarthritis of left hip 09/14/2018   Primary localized osteoarthritis of hip 08/02/2018   Primary osteoarthritis of right hip 07/07/2018   Near syncope 12/03/2016   CVA (cerebral vascular accident) ([place]) 12/03/2016   Right hemiparesis ([place]) 12/02/2016   Acute ischemic stroke ([place]) 12/02/2016   Essential hypertension 12/02/2016   Dysphagia 12/02/2016   Acute CVA (cerebrovascular accident) ([place]) 09/13/2015   TIA (transient ischemic attack) 11/29/2014   Shoulder stiffness 09/05/2012   Right rotator cuff tear 08/16/2012   Shoulder subluxation, right 08/09/2012   Elevated transaminase level 11/05/2011   Coronary atherosclerosis of native coronary artery 11/26/2010   Essential hypertension, benign 11/23/2010   Diabetes mellitus without complication ([place]) 73/22/0254   Hyperlipidemia 11/23/2010   History of TIAs 11/23/2010    Past Surgical History:  Procedure Laterality Date   COLONOSCOPY  08/13/2010   Procedure: COLONOSCOPY;  Surgeon: Rogene Houston, MD;  Location: [facility];  Service: Endoscopy;  Laterality: N/A;   JOINT REPLACEMENT     KNEE SURGERY     arthroscopic   LEFT HEART CATHETERIZATION WITH CORONARY ANGIOGRAM N/A 11/24/2010   Procedure: LEFT HEART CATHETERIZATION WITH CORONARY ANGIOGRAM;  Surgeon: Josue Hector, MD;  Location[facility] C[facility] LAB;  Service: Cardiovascular;  Laterality: N/A;   neck fusion     years ago   TOTAL HIP ARTHROPLASTY  Right 08/02/2018   Procedure: TOTAL HIP ARTHROPLASTY ANTERIOR APPROACH;  Surgeon: Renette Butters, MD;  Location: [facility] ORS;  Service: Orthopedics;  Laterality: Right;   TOTAL HIP ARTHROPLASTY Left 10/18/2018   Procedure: TOTAL HIP ARTHROPLASTY ANTERIOR APPROACH;  Surgeon: Renette Butters, MD;  Location: [facility] ORS;  Service: Orthopedics;  Laterality: Left;    Prior to Admission medications   Medication Sig Start Date End Date Taking? Authorizing Provider  aspirin EC 81 MG tablet Take 1 tablet (81 mg total) by mouth 2 (two) times daily. For DVT prophylaxis for 30 days after surgery. 10/18/18   Prudencio Burly III, PA-C  cetirizine (ZYRTEC) 10 MG tablet Take 10 mg by mouth daily.    [provider]  cloNIDine (CATAPRES - DOSED IN MG/24 HR) 0.3 mg/24hr patch Place 1 patch (0.3 mg total) onto the skin once a week. Patient taking differently: Place 0.3 mg onto the skin every Saturday. 05/26/17   Caren Macadam, MD  clopidogrel (PLAVIX) 75 MG tablet Take 1 tablet (75 mg total) by mouth daily. 06/10/17   Caren Macadam, MD  escitalopram (LEXAPRO) 20 MG tablet Take 20 mg by mouth daily. 10/26/20   [provider]  ferrous sulfate 325 (65 FE) MG tablet Take 325 mg by mouth daily with breakfast.    [provider]  isosorbide mononitrate (IMDUR) 30 MG 24 hr tablet Take 30 mg by mouth every evening.  11/30/16   [provider]  levothyroxine (SYNTHROID, LEVOTHROID) 50 MCG tablet Take 1 tablet (50 mcg total) by mouth daily. Patient taking differently: Take 50 mcg by mouth daily before breakfast. 06/10/17   Caren Macadam, MD  lisinopril (PRINIVIL,ZESTRIL) 40 MG tablet Take 1 tablet (40 mg total) by mouth daily. 04/01/17   Caren Macadam, MD  metFORMIN (GLUCOPHAGE) 1000 MG tablet Take 1,000 mg by mouth 2 (two) times daily. 10/21/20   [provider]  metoprolol succinate (TOPROL-XL) 25 MG 24 hr tablet Take 25 mg by mouth every evening.     [provider]   Multiple Vitamin (MULTIVITAMIN) tablet Take 1 tablet by mouth daily.      [provider]  nitroGLYCERIN (NITROSTAT) 0.4 MG SL tablet Place 0.4 mg under the tongue every 5 (five) minutes as needed for chest pain.    [provider]  Omega-3 Fatty Acids (FISH OIL) 1200 MG CAPS Take 1,200 mg by mouth daily.    [provider]  pravastatin (PRAVACHOL) 20 MG tablet Take 20 mg by mouth every evening. 09/04/16   [provider]  rOPINIRole (REQUIP) 0.5 MG tablet Take 2 tablets (1 mg total) by mouth at bedtime. 06/10/17   Caren Macadam, MD  spironolactone (ALDACTONE) 25 MG tablet Take 1 tablet (25 mg total) by mouth daily. Patient taking differently: Take 25 mg by mouth every other day. 06/10/17   Caren Macadam, MD  vitamin B-12 (CYANOCOBALAMIN) 1000 MCG tablet Take 1 tablet (1,000 mcg total) by mouth daily. 11/05/20   Sharen Hones, MD  rosuvastatin (CRESTOR) 40 MG tablet Take 1 tablet (40 mg total) by  mouth daily. 11/26/10 04/08/11  Theora Gianotti, NP    Allergies Patient has no known allergies.  Family History  Problem Relation Age of Onset   Coronary artery disease Father    Asthma Other    Emphysema Mother    Coronary artery disease Paternal Uncle    Brain cancer Brother     Social History Social History   Tobacco Use   Smoking status: Former    Types: Cigarettes    Quit date: 01/13/1980    Years since quitting: 40.8   Smokeless tobacco: Never  Vaping Use   Vaping Use: Never used  Substance Use Topics   Alcohol use: Yes    Comment: occasionally   Drug use: No    Review of Systems   Review of Systems  Constitutional:  Negative for appetite change, chills, fatigue and fever.  Eyes:  Negative for visual disturbance.  Respiratory:  Negative for shortness of breath.   Cardiovascular:  Negative for chest pain.  Gastrointestinal:  Negative for abdominal pain, nausea and vomiting.  Neurological:  Negative for numbness and headaches.   All other systems reviewed and are negative.  Physical Exam Updated Vital Signs BP (!) 177/94 (BP Location: Left Arm)   Pulse 62   Temp 97.8 F (36.6 C) (Oral)   Resp 18   Ht 5\' 8"  (1.727 m)   Wt 81.6 kg   SpO2 96%   BMI 27.37 kg/m   Physical Exam Vitals and nursing note reviewed.  Constitutional:      General: He is not in acute distress.    Appearance: Normal appearance.  HENT:     Head: Normocephalic and atraumatic.  Eyes:     General: No scleral icterus.    Conjunctiva/sclera: Conjunctivae normal.  Pulmonary:     Effort: Pulmonary effort is normal. No respiratory distress.     Breath sounds: Normal breath sounds. No wheezing.  Musculoskeletal:        General: No deformity or signs of injury.     Cervical back: Normal range of motion.  Skin:    Coloration: Skin is not jaundiced or pale.  Neurological:     General: No focal deficit present.     Mental Status: He is alert and oriented to person, place, and time. Mental status is at baseline.     Comments: Alert to person, place, think it is year 2020, nml speech  PERRL, EOMI, face symmetric, nml tongue movement  5/5 strength in the BL upper and lower extremities  Sensation grossly intact in the BL upper and lower extremities  Finger-nose-finger intact BL  Gait deferred   Psychiatric:        Mood and Affect: Mood normal.        Behavior: Behavior normal.     LABS (all labs ordered are listed, but only abnormal results are displayed)  Labs Reviewed  CBC - Abnormal; Notable for the following components:      Result Value   Hemoglobin 12.5 (*)    MCV 72.6 (*)    MCH 22.2 (*)    RDW 20.1 (*)    All other components within normal limits  COMPREHENSIVE METABOLIC PANEL - Abnormal; Notable for the following components:   Glucose, Bld 120 (*)    All other components within normal limits  RESP PANEL BY RT-PCR (FLU A&B, COVID) ARPGX2  ETHANOL  PROTIME-INR  APTT  DIFFERENTIAL  URINE DRUG SCREEN, QUALITATIVE  ([facility] ONLY)  URINALYSIS, ROUTINE W REFLEX MICROSCOPIC  ____________________________________________  EKG  Right bundle branch block with PAC, first-degree AV block, no acute ischemic changes ____________________________________________  RADIOLOGY Almeta Monas, personally viewed and evaluated these images (plain radiographs) as part of my medical decision making, as well as reviewing the written report by the radiologist.  ED MD interpretation: I reviewed the CT head which does not show any acute pathology    ____________________________________________   PROCEDURES  Procedure(s) performed (including Critical Care):  Procedures   ____________________________________________   INITIAL IMPRESSION / ASSESSMENT AND PLAN / ED COURSE     Patient is a 85 year old male with multiple prior TIAs and prior CVAs who is on dual antiplatelet therapy who presents with concern for right limb weakness at his facility.  On my evaluation the patient does not have any memory of this episode of weakness, his son is also not aware of what exactly went on at the facility.  Per the documentation in the chart seems like he had had some right arm and right leg weakness with some confusion which is now resolved.  He has no complaints for me.  Vital signs notable for hypertension but otherwise within normal limits.  His neurologic exam is nonfocal, he is confused of the year but otherwise alert and oriented.  CT head was obtained which shows chronic infarcts in the thalamus and internal capsule which have been shown on prior MRIs but no acute findings.  His lab work-up here is reassuring.  Patient was just recently admitted and discharged 3 days ago for a concern for TIA/CVA and at that time had an MRI that did not show any acute stroke.  Within the last month has also had the rest of his TIA work-up completed including ultrasound of the carotids and MR angio of the head and neck vessels as well as an  echo with bubble study.  At this time I suspect that the most likely thing was that he had a TIA versus some other cause of transient alteration in awareness.  His normal CT head and normal exam today do not feel that he requires an MRI to rule out stroke.  He is already on maximum antiplatelet therapy for stroke prevention.  I think he is appropriate for discharge with close neurology follow-up.      ____________________________________________   FINAL CLINICAL IMPRESSION(S) / ED DIAGNOSES  Final diagnoses:  Weakness     ED Discharge Orders     None        Note:  This document was prepared using Dragon voice recognition software and may include unintentional dictation errors.    Rada Hay, MD 11/08/20 2006

## 2020-11-08 NOTE — ED Triage Notes (Signed)
First Nurse Note: Arrives via [facility]. C/O abnormal speech.  Per EMS report, patient initially confused, difficulty following commands and slurred speech.  Speech has resolved. Following commands.  Initial LVO was 4, now 0.  Evaluated by Dr. Ellender Hose on arrival, cleared to go to triage.  VS wnl.

## 2020-11-09 ENCOUNTER — Emergency Department: Payer: Medicare Other

## 2020-11-09 ENCOUNTER — Inpatient Hospital Stay
Admission: EM | Admit: 2020-11-09 | Discharge: 2020-11-19 | DRG: 071 | Disposition: A | Discharge status: Skilled Nursing Facility | Payer: Medicare Other | Attending: Internal Medicine | Admitting: Internal Medicine

## 2020-11-09 ENCOUNTER — Other Ambulatory Visit: Payer: Self-pay

## 2020-11-09 DIAGNOSIS — Z8673 Personal history of transient ischemic attack (TIA), and cerebral infarction without residual deficits: Secondary | ICD-10-CM

## 2020-11-09 DIAGNOSIS — I110 Hypertensive heart disease with heart failure: Secondary | ICD-10-CM | POA: Diagnosis present

## 2020-11-09 DIAGNOSIS — G9341 Metabolic encephalopathy: Secondary | ICD-10-CM | POA: Diagnosis not present

## 2020-11-09 DIAGNOSIS — R1110 Vomiting, unspecified: Secondary | ICD-10-CM | POA: Diagnosis present

## 2020-11-09 DIAGNOSIS — Z981 Arthrodesis status: Secondary | ICD-10-CM

## 2020-11-09 DIAGNOSIS — E785 Hyperlipidemia, unspecified: Secondary | ICD-10-CM | POA: Diagnosis present

## 2020-11-09 DIAGNOSIS — Z96643 Presence of artificial hip joint, bilateral: Secondary | ICD-10-CM | POA: Diagnosis present

## 2020-11-09 DIAGNOSIS — I160 Hypertensive urgency: Secondary | ICD-10-CM

## 2020-11-09 DIAGNOSIS — R4182 Altered mental status, unspecified: Secondary | ICD-10-CM | POA: Insufficient documentation

## 2020-11-09 DIAGNOSIS — E039 Hypothyroidism, unspecified: Secondary | ICD-10-CM | POA: Diagnosis present

## 2020-11-09 DIAGNOSIS — R488 Other symbolic dysfunctions: Secondary | ICD-10-CM | POA: Diagnosis present

## 2020-11-09 DIAGNOSIS — Z7902 Long term (current) use of antithrombotics/antiplatelets: Secondary | ICD-10-CM

## 2020-11-09 DIAGNOSIS — Z7982 Long term (current) use of aspirin: Secondary | ICD-10-CM

## 2020-11-09 DIAGNOSIS — L89321 Pressure ulcer of left buttock, stage 1: Secondary | ICD-10-CM | POA: Diagnosis present

## 2020-11-09 DIAGNOSIS — J101 Influenza due to other identified influenza virus with other respiratory manifestations: Secondary | ICD-10-CM | POA: Diagnosis not present

## 2020-11-09 DIAGNOSIS — I2510 Atherosclerotic heart disease of native coronary artery without angina pectoris: Secondary | ICD-10-CM | POA: Diagnosis present

## 2020-11-09 DIAGNOSIS — Z808 Family history of malignant neoplasm of other organs or systems: Secondary | ICD-10-CM

## 2020-11-09 DIAGNOSIS — L8990 Pressure ulcer of unspecified site, unspecified stage: Secondary | ICD-10-CM | POA: Insufficient documentation

## 2020-11-09 DIAGNOSIS — L89311 Pressure ulcer of right buttock, stage 1: Secondary | ICD-10-CM | POA: Diagnosis present

## 2020-11-09 DIAGNOSIS — Z7984 Long term (current) use of oral hypoglycemic drugs: Secondary | ICD-10-CM

## 2020-11-09 DIAGNOSIS — I10 Essential (primary) hypertension: Secondary | ICD-10-CM | POA: Diagnosis present

## 2020-11-09 DIAGNOSIS — I5032 Chronic diastolic (congestive) heart failure: Secondary | ICD-10-CM | POA: Diagnosis present

## 2020-11-09 DIAGNOSIS — I69398 Other sequelae of cerebral infarction: Secondary | ICD-10-CM

## 2020-11-09 DIAGNOSIS — Z79899 Other long term (current) drug therapy: Secondary | ICD-10-CM

## 2020-11-09 DIAGNOSIS — Z85820 Personal history of malignant melanoma of skin: Secondary | ICD-10-CM

## 2020-11-09 DIAGNOSIS — Z8249 Family history of ischemic heart disease and other diseases of the circulatory system: Secondary | ICD-10-CM

## 2020-11-09 DIAGNOSIS — Z79890 Hormone replacement therapy (postmenopausal): Secondary | ICD-10-CM

## 2020-11-09 DIAGNOSIS — R531 Weakness: Secondary | ICD-10-CM | POA: Diagnosis present

## 2020-11-09 DIAGNOSIS — E119 Type 2 diabetes mellitus without complications: Secondary | ICD-10-CM | POA: Diagnosis present

## 2020-11-09 DIAGNOSIS — R52 Pain, unspecified: Secondary | ICD-10-CM

## 2020-11-09 DIAGNOSIS — Z87891 Personal history of nicotine dependence: Secondary | ICD-10-CM

## 2020-11-09 DIAGNOSIS — Z20822 Contact with and (suspected) exposure to covid-19: Secondary | ICD-10-CM | POA: Diagnosis present

## 2020-11-09 DIAGNOSIS — Z825 Family history of asthma and other chronic lower respiratory diseases: Secondary | ICD-10-CM

## 2020-11-09 DIAGNOSIS — R4701 Aphasia: Secondary | ICD-10-CM | POA: Diagnosis present

## 2020-11-09 LAB — RESP PANEL BY RT-PCR (FLU A&B, COVID) ARPGX2
Influenza A by PCR: NEGATIVE
Influenza B by PCR: NEGATIVE
SARS Coronavirus 2 by RT PCR: NEGATIVE

## 2020-11-09 LAB — BASIC METABOLIC PANEL
Anion gap: 10 (ref 5–15)
BUN: 16 mg/dL (ref 8–23)
CO2: 19 mmol/L — ABNORMAL LOW (ref 22–32)
Calcium: 9.3 mg/dL (ref 8.9–10.3)
Chloride: 105 mmol/L (ref 98–111)
Creatinine, Ser: 0.8 mg/dL (ref 0.61–1.24)
GFR, Estimated: >60 mL/min (ref >60)
Glucose, Bld: 152 mg/dL — ABNORMAL HIGH (ref 70–99)
Potassium: 5 mmol/L (ref 3.5–5.1)
Sodium: 134 mmol/L — ABNORMAL LOW (ref 135–145)

## 2020-11-09 LAB — CBC
HCT: 42.1 % (ref 39.0–52.0)
Hemoglobin: 12.9 g/dL — ABNORMAL LOW (ref 13.0–17.0)
MCH: 22.4 pg — ABNORMAL LOW (ref 26.0–34.0)
MCHC: 30.6 g/dL (ref 30.0–36.0)
MCV: 73 fL — ABNORMAL LOW (ref 80.0–100.0)
Platelets: 253 10*3/uL (ref 150–400)
RBC: 5.77 MIL/uL (ref 4.22–5.81)
RDW: 20 % — ABNORMAL HIGH (ref 11.5–15.5)
WBC: 6.5 10*3/uL (ref 4.0–10.5)
nRBC: 0 % (ref 0.0–0.2)

## 2020-11-09 LAB — PROTIME-INR
INR: 1.1 (ref 0.8–1.2)
Prothrombin Time: 14.5 seconds (ref 11.4–15.2)

## 2020-11-09 LAB — TROPONIN I (HIGH SENSITIVITY): Troponin I (High Sensitivity): 12 ng/L (ref <18)

## 2020-11-09 MED ORDER — INSULIN ASPART 100 UNIT/ML IJ SOLN
0.0000 [IU] | Freq: Every day | INTRAMUSCULAR | Status: DC
  Filled 2020-11-09: qty 1

## 2020-11-09 MED ORDER — ACETAMINOPHEN 160 MG/5ML PO SOLN
650.0000 mg | ORAL | Status: DC | PRN
  Filled 2020-11-09: qty 20.3

## 2020-11-09 MED ORDER — ACETAMINOPHEN 650 MG RE SUPP
650.0000 mg | RECTAL | Status: DC | PRN
  Filled 2020-11-09: qty 1
  Filled 2020-11-09: qty 2

## 2020-11-09 MED ORDER — SENNOSIDES-DOCUSATE SODIUM 8.6-50 MG PO TABS: 1.0000 | ORAL_TABLET | Freq: Every evening | ORAL | Status: DC | PRN

## 2020-11-09 MED ORDER — ENOXAPARIN SODIUM 40 MG/0.4ML IJ SOSY
40.0000 mg | PREFILLED_SYRINGE | INTRAMUSCULAR | Status: DC
  Administered 2020-11-10 – 2020-11-18 (×10): 40 mg via SUBCUTANEOUS
  Filled 2020-11-09 (×10): qty 0.4

## 2020-11-09 MED ORDER — INSULIN ASPART 100 UNIT/ML IJ SOLN
0.0000 [IU] | Freq: Three times a day (TID) | INTRAMUSCULAR | Status: DC
  Administered 2020-11-10 (×3): 2 [IU] via SUBCUTANEOUS
  Administered 2020-11-11 (×2): 3 [IU] via SUBCUTANEOUS
  Administered 2020-11-12: 2 [IU] via SUBCUTANEOUS
  Administered 2020-11-12 (×2): 3 [IU] via SUBCUTANEOUS
  Administered 2020-11-13 (×2): 2 [IU] via SUBCUTANEOUS
  Administered 2020-11-13: 5 [IU] via SUBCUTANEOUS
  Administered 2020-11-14 (×2): 3 [IU] via SUBCUTANEOUS
  Administered 2020-11-14: 8 [IU] via SUBCUTANEOUS
  Administered 2020-11-15: 2 [IU] via SUBCUTANEOUS
  Administered 2020-11-15 (×2): 5 [IU] via SUBCUTANEOUS
  Administered 2020-11-16: 2 [IU] via SUBCUTANEOUS
  Administered 2020-11-16 – 2020-11-17 (×4): 3 [IU] via SUBCUTANEOUS
  Administered 2020-11-17: 2 [IU] via SUBCUTANEOUS
  Administered 2020-11-18: 5 [IU] via SUBCUTANEOUS
  Administered 2020-11-18: 2 [IU] via SUBCUTANEOUS
  Administered 2020-11-18: 3 [IU] via SUBCUTANEOUS
  Administered 2020-11-19: 5 [IU] via SUBCUTANEOUS
  Administered 2020-11-19: 2 [IU] via SUBCUTANEOUS
  Filled 2020-11-09 (×28): qty 1

## 2020-11-09 MED ORDER — STROKE: EARLY STAGES OF RECOVERY BOOK: Freq: Once | Status: DC

## 2020-11-09 MED ORDER — ACETAMINOPHEN 325 MG PO TABS: 650.0000 mg | ORAL_TABLET | ORAL | Status: DC | PRN

## 2020-11-09 MED ORDER — SODIUM CHLORIDE 0.9 % IV SOLN
INTRAVENOUS | Status: DC
Start: 2020-11-09 — End: 2020-11-12

## 2020-11-09 NOTE — ED Notes (Signed)
Dawn, Agricultural consultant notified of level 2 acuity patient.

## 2020-11-09 NOTE — H&P (Signed)
History and Physical    Steve Ramirez XBM:[id number] DOB: 1935-08-13 DOA: 11/09/2020  PCP: Sharilyn Sites, MD   Patient coming from: home  I have personally briefly reviewed patient's old medical records in [place]  Chief Complaint: altered mental status  HPI: Steve Ramirez is a 85 y.o. male with medical history significant for HTN, DM,  multiple CVAs and TIAs, recently hospitalized from 10/23-10/25 with TIA presenting with altered mental status with negative EEG and MRI showing only old lacunar infarcts and who is currently on dual antiplatelets therapy, who presents to the ED for the second time in 2 days with altered mental status.  On his first evaluation in the ED on 10/28, he presented with slurred speech that resolved by arrival.  He had a negative CT and was discharged back to his living facility.  He was apparently back to normal however on 10/27, at 5 PM he was seen to slump over in his chair and stopped talking reason for which she was again brought to the ED.  He otherwise showed no focalization of symptoms with no one-sided weakness or one-sided drooping of the face.  He had no seizure activity.  No other reports of any symptoms such as nausea, vomiting, chest pain or shortness of breath.  No abdominal pain or diarrhea  ED course: On arrival BP 177/94 with otherwise normal vitals Blood work: Unremarkable  EKG: Sinus tachycardia at 100 with RBBB and no acute ST-T wave changes  Imaging: CT head with no acute intracranial abnormality MRI brain pending  Hospitalist consulted for admission due to recurrent episodes of altered mental status and recent negative stroke work-up.  Review of Systems: Unable to obtain due to confusion  Past Medical History:  Diagnosis Date   Coronary atherosclerosis of native coronary artery    Essential hypertension, benign    GIB (gastrointestinal bleeding)    Colonic diverticular bleed   History of melanoma    History of  TIAs    Stroke [facility])    Type 2 diabetes mellitus ([place])     Past Surgical History:  Procedure Laterality Date   COLONOSCOPY  08/13/2010   Procedure: COLONOSCOPY;  Surgeon: Rogene Houston, MD;  Location: [facility] ENDO SUITE;  Service: Endoscopy;  Laterality: N/A;   JOINT REPLACEMENT     KNEE SURGERY     arthroscopic   LEFT HEART CATHETERIZATION WITH CORONARY ANGIOGRAM N/A 11/24/2010   Procedure: LEFT HEART CATHETERIZATION WITH CORONARY ANGIOGRAM;  Surgeon: Josue Hector, MD;  Location: [facility] CATH LAB;  Service: Cardiovascular;  Laterality: N/A;   neck fusion     years ago   TOTAL HIP ARTHROPLASTY Right 08/02/2018   Procedure: TOTAL HIP ARTHROPLASTY ANTERIOR APPROACH;  Surgeon: Renette Butters, MD;  Location: [facility] ORS;  Service: Orthopedics;  Laterality: Right;   TOTAL HIP ARTHROPLASTY Left 10/18/2018   Procedure: TOTAL HIP ARTHROPLASTY ANTERIOR APPROACH;  Surgeon: Renette Butters, MD;  Location: [facility] ORS;  Service: Orthopedics;  Laterality: Left;     reports that he quit smoking about 40 years ago. His smoking use included cigarettes. He has never used smokeless tobacco. He reports current alcohol use. He reports that he does not use drugs.  No Known Allergies  Family History  Problem Relation Age of Onset   Coronary artery disease Father    Asthma Other    Emphysema Mother    Coronary artery disease Paternal Uncle    Brain cancer Brother       Prior  to Admission medications   Medication Sig Start Date End Date Taking? Authorizing Provider  aspirin EC 81 MG tablet Take 1 tablet (81 mg total) by mouth 2 (two) times daily. For DVT prophylaxis for 30 days after surgery. 10/18/18   Prudencio Burly III, PA-C  cetirizine (ZYRTEC) 10 MG tablet Take 10 mg by mouth daily.    [provider]  cloNIDine (CATAPRES - DOSED IN MG/24 HR) 0.3 mg/24hr patch Place 1 patch (0.3 mg total) onto the skin once a week. Patient taking differently: Place 0.3 mg onto the skin every Saturday. 05/26/17    Caren Macadam, MD  clopidogrel (PLAVIX) 75 MG tablet Take 1 tablet (75 mg total) by mouth daily. 06/10/17   Caren Macadam, MD  escitalopram (LEXAPRO) 20 MG tablet Take 20 mg by mouth daily. 10/26/20   [provider]  ferrous sulfate 325 (65 FE) MG tablet Take 325 mg by mouth daily with breakfast.    [provider]  isosorbide mononitrate (IMDUR) 30 MG 24 hr tablet Take 30 mg by mouth every evening.  11/30/16   [provider]  levothyroxine (SYNTHROID, LEVOTHROID) 50 MCG tablet Take 1 tablet (50 mcg total) by mouth daily. Patient taking differently: Take 50 mcg by mouth daily before breakfast. 06/10/17   Caren Macadam, MD  lisinopril (PRINIVIL,ZESTRIL) 40 MG tablet Take 1 tablet (40 mg total) by mouth daily. 04/01/17   Caren Macadam, MD  metFORMIN (GLUCOPHAGE) 1000 MG tablet Take 1,000 mg by mouth 2 (two) times daily. 10/21/20   [provider]  metoprolol succinate (TOPROL-XL) 25 MG 24 hr tablet Take 25 mg by mouth every evening.     [provider]  Multiple Vitamin (MULTIVITAMIN) tablet Take 1 tablet by mouth daily.      [provider]  nitroGLYCERIN (NITROSTAT) 0.4 MG SL tablet Place 0.4 mg under the tongue every 5 (five) minutes as needed for chest pain.    [provider]  Omega-3 Fatty Acids (FISH OIL) 1200 MG CAPS Take 1,200 mg by mouth daily.    [provider]  pravastatin (PRAVACHOL) 20 MG tablet Take 20 mg by mouth every evening. 09/04/16   [provider]  rOPINIRole (REQUIP) 0.5 MG tablet Take 2 tablets (1 mg total) by mouth at bedtime. 06/10/17   Caren Macadam, MD  spironolactone (ALDACTONE) 25 MG tablet Take 1 tablet (25 mg total) by mouth daily. Patient taking differently: Take 25 mg by mouth every other day. 06/10/17   Caren Macadam, MD  vitamin B-12 (CYANOCOBALAMIN) 1000 MCG tablet Take 1 tablet (1,000 mcg total) by mouth daily. 11/05/20   Sharen Hones, MD  rosuvastatin (CRESTOR) 40 MG tablet  Take 1 tablet (40 mg total) by mouth daily. 11/26/10 04/08/11  Theora Gianotti, NP    Physical Exam: Vitals:   11/09/20 2100 11/09/20 2130 11/09/20 2200 11/09/20 2230  BP: (!) 179/76 (!) 154/84 (!) 170/82 (!) 153/81  Pulse: 100 (!) 104 (!) 104 (!) 101  Resp: 15 18 20 20   Temp:      SpO2: 96% 97% 96% 98%  Weight:      Height:         Vitals:   11/09/20 2100 11/09/20 2130 11/09/20 2200 11/09/20 2230  BP: (!) 179/76 (!) 154/84 (!) 170/82 (!) 153/81  Pulse: 100 (!) 104 (!) 104 (!) 101  Resp: 15 18 20 20   Temp:      SpO2: 96% 97% 96% 98%  Weight:      Height:  Constitutional: Awake and pleasant and able to answer simple questions but unable to answer orientation questions.  Unable to state name or where he is or the year.  Appears very confused. Not in any apparent distress HEENT:      Head: Normocephalic and atraumatic.         Eyes: PERLA, EOMI, Conjunctivae are normal. Sclera is non-icteric.       Mouth/Throat: Mucous membranes are moist.       Neck: Supple with no signs of meningismus. Cardiovascular: Regular rate and rhythm. No murmurs, gallops, or rubs. 2+ symmetrical distal pulses are present . No JVD. No LE edema Respiratory: Respiratory effort normal .Lungs sounds clear bilaterally. No wheezes, crackles, or rhonchi.  Gastrointestinal: Soft, non tender, and non distended with positive bowel sounds.  Genitourinary: No CVA tenderness. Musculoskeletal: Nontender with normal range of motion in all extremities. No cyanosis, or erythema of extremities. Neurologic:  Face is symmetric. Moving all extremities. No gross focal neurologic deficits . Skin: Skin is warm, dry.  No rash or ulcers Psychiatric: Mood and affect are normal    Labs on Admission: I have personally reviewed following labs and imaging studies  CBC: Recent Labs  Lab 11/03/20 1540 11/03/20 2152 11/08/20 1124 11/09/20 2003  WBC 7.3 7.1 5.9 6.5  NEUTROABS 3.8  --  3.4  --   HGB 12.6*  12.5* 12.5* 12.9*  HCT 41.0 39.9 40.8 42.1  MCV 73.1* 71.9* 72.6* 73.0*  PLT 321 288 287 132   Basic Metabolic Panel: Recent Labs  Lab 11/03/20 1540 11/03/20 2152 11/08/20 1124 11/09/20 2003  NA 139  --  139 134*  K 3.7  --  4.5 5.0  CL 100  --  109 105  CO2 24  --  24 19*  GLUCOSE 132*  --  120* 152*  BUN 15  --  20 16  CREATININE 0.87 0.77 0.75 0.80  CALCIUM 9.2  --  9.7 9.3   GFR: Estimated Creatinine Clearance: 67.7 mL/min (by C-G formula based on SCr of 0.8 mg/dL). Liver Function Tests: Recent Labs  Lab 11/03/20 1540 11/08/20 1124  AST 16 17  ALT 16 18  ALKPHOS 70 61  BILITOT 0.6 0.8  PROT 8.0 7.5  ALBUMIN 4.7 4.6   No results for input(s): LIPASE, AMYLASE in the last 168 hours. Recent Labs  Lab 11/04/20 0359  AMMONIA 22   Coagulation Profile: Recent Labs  Lab 11/03/20 1540 11/08/20 1124 11/09/20 2003  INR 1.0 1.1 1.1   Cardiac Enzymes: No results for input(s): CKTOTAL, CKMB, CKMBINDEX, TROPONINI in the last 168 hours. BNP (last 3 results) No results for input(s): PROBNP in the last 8760 hours. HbA1C: No results for input(s): HGBA1C in the last 72 hours. CBG: Recent Labs  Lab 11/04/20 1241 11/04/20 1552 11/04/20 2005 11/05/20 0732 11/05/20 1223  GLUCAP 129* 154* 173* 114* 166*   Lipid Profile: No results for input(s): CHOL, HDL, LDLCALC, TRIG, CHOLHDL, LDLDIRECT in the last 72 hours. Thyroid Function Tests: No results for input(s): TSH, T4TOTAL, FREET4, T3FREE, THYROIDAB in the last 72 hours. Anemia Panel: No results for input(s): VITAMINB12, FOLATE, FERRITIN, TIBC, IRON, RETICCTPCT in the last 72 hours. Urine analysis:    Component Value Date/Time   COLORURINE YELLOW (A) 11/04/2020 1946   APPEARANCEUR CLEAR (A) 11/04/2020 1946   LABSPEC 1.016 11/04/2020 1946   PHURINE 6.0 11/04/2020 1946   GLUCOSEU NEGATIVE 11/04/2020 1946   HGBUR NEGATIVE 11/04/2020 [place] NEGATIVE 11/04/2020 1946   KETONESUR  NEGATIVE 11/04/2020 1946    PROTEINUR NEGATIVE 11/04/2020 1946   UROBILINOGEN 0.2 04/15/2010 1425   NITRITE NEGATIVE 11/04/2020 1946   LEUKOCYTESUR NEGATIVE 11/04/2020 1946    Radiological Exams on Admission: DG Chest 1 View  Result Date: 11/09/2020 CLINICAL DATA:  Hypertension EXAM: CHEST  1 VIEW COMPARISON:  11/03/2020 FINDINGS: Lungs volumes are small, but are symmetric and are clear. No pneumothorax or pleural effusion. Cardiac size within normal limits. Pulmonary vascularity is normal. Osseous structures are age-appropriate. No acute bone abnormality. IMPRESSION: Pulmonary hypoinflation Electronically Signed   By: Fidela Salisbury M.D.   On: 11/09/2020 20:48   CT HEAD WO CONTRAST  Result Date: 11/09/2020 CLINICAL DATA:  Weakness and vomiting. EXAM: CT HEAD WITHOUT CONTRAST TECHNIQUE: Contiguous axial images were obtained from the base of the skull through the vertex without intravenous contrast. COMPARISON:  November 08, 2020 FINDINGS: Brain: There is mild cerebral atrophy with widening of the extra-axial spaces and ventricular dilatation. There are areas of decreased attenuation within the white matter tracts of the supratentorial brain, consistent with microvascular disease changes. Small, chronic bilateral basal ganglia lacunar infarcts are seen. Vascular: No hyperdense vessel or unexpected calcification. Skull: Normal. Negative for fracture or focal lesion. Sinuses/Orbits: No acute finding. Other: None. IMPRESSION: 1. Generalized cerebral atrophy. 2. Small, chronic bilateral basal ganglia lacunar infarcts. 3. No acute intracranial abnormality. Electronically Signed   By: Virgina Norfolk M.D.   On: 11/09/2020 20:44   CT HEAD WO CONTRAST (5MM)  Result Date: 11/08/2020 CLINICAL DATA:  Transient ischemic attack (TIA) EXAM: CT HEAD WITHOUT CONTRAST TECHNIQUE: Contiguous axial images were obtained from the base of the skull through the vertex without intravenous contrast. COMPARISON:  November 03, 2020. FINDINGS: Brain:  Similar remote infarcts in the left thalamus and posterior limb the internal capsule. Similar advanced patchy moderate to advanced white matter hypoattenuation, nonspecific but compatible with chronic microvascular ischemic disease. No evidence of acute large vascular territory infarct. No acute hemorrhage, hydrocephalus, mass lesion midline shift. Mild for age atrophy. Vascular: No hyperdense vessel identified. Calcific intracranial atherosclerosis. Skull: No acute fracture. Sinuses/Orbits: Mild mucosal thickening in the visualized sinuses. Unremarkable orbits. Other: No mastoid effusions. IMPRESSION: 1. No evidence of acute intracranial abnormality. 2. Remote infarcts in the left thalamus/posterior limb internal capsule. 3. Advanced chronic microvascular disease. Electronically Signed   By: Margaretha Sheffield M.D.   On: 11/08/2020 11:22     Assessment/Plan  Acute confusional state ? TIA, ? Post ictal Seizure ?Syncope -Patient presents with sudden onset altered mental status in which he slumped over in chair and stopped talking - Suspect related to recurrent TIA - No stigmata of acute infection -Follow MRI to evaluate for PRES -Patient had echo on 10/13 and carotid Dopplers on 9/23 which will not be repeated - Had a negative EEG on 10/24 - Neurologic checks and aspiration precautions - Treat suspected TIA  Probable TIA in patient with history of multiple TIAs and CVAs -Recent negative stroke work-up during hospitalization from 10/23-10/25 - Follow-up MRI done from the ED - Continuous cardiac monitoring - Continue aspirin, Plavix and pravastatin - Neurology consult for additional recommendations  Hypertension - Elevated above goal BP 177/94 in the ED - We will allow for permissive hypertension given likely recurrent TIA    CAD (coronary artery disease) -Continue clopidogrel, isosorbide, metoprolol pravastatin, nitroglycerin and fish oil - EKG was nonacute    Diabetes mellitus without  complication  -Sliding scale insulin coverage    Hypothyroidism - Continue levothyroxine  DVT prophylaxis: Lovenox  Code Status: full code  Family Communication:  daughter in law at bedside Disposition Plan: Back to previous home environment Consults called: neurology  Status:observaton    Athena Masse MD [facility]     11/09/2020, 10:55 PM

## 2020-11-09 NOTE — ED Provider Notes (Signed)
[facility]  ____________________________________________   Event Date/Time   First MD Initiated Contact with Patient 11/09/20 2044     (approximate)  I have reviewed the triage vital signs and the nursing notes.   HISTORY  Chief Complaint Emesis, Hypertension (/), and Weakness (Pt. To ED via EMS for persistent high blood pressure, increased weakness, confusion, intermittent aphasia and vomited while in triage. Pt. Was seen in ED last night, diagnosed with "mini strokes". Pt. Is oriented x2,. Pt's daughter in law states that he has been having more frequent similar episode in the last few months, with increasing occurrence.)    HPI Steve Ramirez is a 85 y.o. male hypertension, hyperlipidemia, diabetes mellitus, multiple stroke and TIA (3 strokes and 7 TIA per his son), hypothyroidism, depression, diverticular GI bleeding, CAD who presents with AMS.  Patient is unable to provide significant history given his altered mental status.  Around 5 PM today patient seemed to slump over and ran became confused and sleepy.  At baseline he does have some memory issues but is alert and oriented.  He has had several of these episodes before and the family has thought that these were TIAs.  Has been to the hospital several times recently.  Was admitted just several days ago and had an MRI that was negative for acute CVA.  Was then seen in the ED last night after an episode of confusion and some questionable right-sided weakness but was back to baseline so was discharged.  The patient's daughter-in-law is not aware of any fevers chills or other focal infectious symptoms.         Past Medical History:  Diagnosis Date   Coronary atherosclerosis of native coronary artery    Essential hypertension, benign    GIB (gastrointestinal bleeding)    Colonic diverticular bleed   History of melanoma    History of TIAs    Stroke [facility])    Type 2 diabetes mellitus ([place])      Patient Active Problem List   Diagnosis Date Noted   Depression 10/03/2020   Hypothyroidism 10/03/2020   CAD (coronary artery disease) 76/19/5093   Acute metabolic encephalopathy 26/71/2458   Primary osteoarthritis of left hip 09/14/2018   Primary localized osteoarthritis of hip 08/02/2018   Primary osteoarthritis of right hip 07/07/2018   Near syncope 12/03/2016   CVA (cerebral vascular accident) ([place]) 12/03/2016   Right hemiparesis ([place]) 12/02/2016   Acute ischemic stroke ([place]) 12/02/2016   Essential hypertension 12/02/2016   Dysphagia 12/02/2016   Acute CVA (cerebrovascular accident) ([place]) 09/13/2015   TIA (transient ischemic attack) 11/29/2014   Shoulder stiffness 09/05/2012   Right rotator cuff tear 08/16/2012   Shoulder subluxation, right 08/09/2012   Elevated transaminase level 11/05/2011   Coronary atherosclerosis of native coronary artery 11/26/2010   Essential hypertension, benign 11/23/2010   Diabetes mellitus without complication ([place]) 09/98/3382   Hyperlipidemia 11/23/2010   History of TIAs 11/23/2010    Past Surgical History:  Procedure Laterality Date   COLONOSCOPY  08/13/2010   Procedure: COLONOSCOPY;  Surgeon: Rogene Houston, MD;  Location: [facility] ENDO [facility]ITE;  Service: Endoscopy;  Laterality: N/A;   JOINT REPLACEMENT     KNEE SURGERY     arthroscopic   LEFT HEART CATHETERIZATION WITH CORONARY ANGIOGRAM N/A 11/24/2010   Procedure: LEFT HEART CATHETERIZATION WITH CORONARY ANGIOGRAM;  Surgeon: Josue Hector, MD;  Location: [facility] CATH LAB;  Service: Cardiovascular;  Laterality: N/A;   neck fusion     years  ago   TOTAL HIP ARTHROPLASTY Right 08/02/2018   Procedure: TOTAL HIP ARTHROPLASTY ANTERIOR APPROACH;  Surgeon: Renette Butters, MD;  Location: [facility] ORS;  Service: Orthopedics;  Laterality: Right;   TOTAL HIP ARTHROPLASTY Left 10/18/2018   Procedure: TOTAL HIP ARTHROPLASTY ANTERIOR APPROACH;  Surgeon: Renette Butters, MD;  Location: [facility] ORS;  Service:  Orthopedics;  Laterality: Left;    Prior to Admission medications   Medication Sig Start Date End Date Taking? Authorizing Provider  aspirin EC 81 MG tablet Take 1 tablet (81 mg total) by mouth 2 (two) times daily. For DVT prophylaxis for 30 days after surgery. 10/18/18   Prudencio Burly III, PA-C  cetirizine (ZYRTEC) 10 MG tablet Take 10 mg by mouth daily.    [provider]  cloNIDine (CATAPRES - DOSED IN MG/24 HR) 0.3 mg/24hr patch Place 1 patch (0.3 mg total) onto the skin once a week. Patient taking differently: Place 0.3 mg onto the skin every Saturday. 05/26/17   Caren Macadam, MD  clopidogrel (PLAVIX) 75 MG tablet Take 1 tablet (75 mg total) by mouth daily. 06/10/17   Caren Macadam, MD  escitalopram (LEXAPRO) 20 MG tablet Take 20 mg by mouth daily. 10/26/20   [provider]  ferrous sulfate 325 (65 FE) MG tablet Take 325 mg by mouth daily with breakfast.    [provider]  isosorbide mononitrate (IMDUR) 30 MG 24 hr tablet Take 30 mg by mouth every evening.  11/30/16   [provider]  levothyroxine (SYNTHROID, LEVOTHROID) 50 MCG tablet Take 1 tablet (50 mcg total) by mouth daily. Patient taking differently: Take 50 mcg by mouth daily before breakfast. 06/10/17   Caren Macadam, MD  lisinopril (PRINIVIL,ZESTRIL) 40 MG tablet Take 1 tablet (40 mg total) by mouth daily. 04/01/17   Caren Macadam, MD  metFORMIN (GLUCOPHAGE) 1000 MG tablet Take 1,000 mg by mouth 2 (two) times daily. 10/21/20   [provider]  metoprolol succinate (TOPROL-XL) 25 MG 24 hr tablet Take 25 mg by mouth every evening.     [provider]  Multiple Vitamin (MULTIVITAMIN) tablet Take 1 tablet by mouth daily.      [provider]  nitroGLYCERIN (NITROSTAT) 0.4 MG SL tablet Place 0.4 mg under the tongue every 5 (five) minutes as needed for chest pain.    [provider]  Omega-3 Fatty Acids (FISH OIL) 1200 MG CAPS Take 1,200 mg by mouth  daily.    [provider]  pravastatin (PRAVACHOL) 20 MG tablet Take 20 mg by mouth every evening. 09/04/16   [provider]  rOPINIRole (REQUIP) 0.5 MG tablet Take 2 tablets (1 mg total) by mouth at bedtime. 06/10/17   Caren Macadam, MD  spironolactone (ALDACTONE) 25 MG tablet Take 1 tablet (25 mg total) by mouth daily. Patient taking differently: Take 25 mg by mouth every other day. 06/10/17   Caren Macadam, MD  vitamin B-12 (CYANOCOBALAMIN) 1000 MCG tablet Take 1 tablet (1,000 mcg total) by mouth daily. 11/05/20   Sharen Hones, MD  rosuvastatin (CRESTOR) 40 MG tablet Take 1 tablet (40 mg total) by mouth daily. 11/26/10 04/08/11  Theora Gianotti, NP    Allergies Patient has no known allergies.  Family History  Problem Relation Age of Onset   Coronary artery disease Father    Asthma Other    Emphysema Mother    Coronary artery disease Paternal Uncle    Brain cancer Brother     Social History Social History  Tobacco Use   Smoking status: Former    Types: Cigarettes    Quit date: 01/13/1980    Years since quitting: 40.8   Smokeless tobacco: Never  Vaping Use   Vaping Use: Never used  Substance Use Topics   Alcohol use: Yes    Comment: occasionally   Drug use: No    Review of Systems   Review of Systems  Unable to perform ROS: Mental status change   Physical Exam Updated Vital Signs BP (!) 154/84   Pulse (!) 104   Temp 99.5 F (37.5 C)   Resp 18   Ht 5\' 8"  (1.727 m)   Wt 81.6 kg   SpO2 97%   BMI 27.37 kg/m   Physical Exam Vitals and nursing note reviewed.  Constitutional:      General: He is not in acute distress. HENT:     Head: Normocephalic and atraumatic.  Eyes:     General: No scleral icterus.    Conjunctiva/sclera: Conjunctivae normal.  Cardiovascular:     Rate and Rhythm: Tachycardia present.  Pulmonary:     Effort: Pulmonary effort is normal. No respiratory distress.     Breath sounds: Normal breath sounds. No  wheezing.  Abdominal:     General: Abdomen is flat. There is no distension.     Palpations: Abdomen is soft.     Tenderness: There is no abdominal tenderness. There is no guarding.  Musculoskeletal:        General: No deformity or signs of injury.     Cervical back: Normal range of motion.  Skin:    Coloration: Skin is not jaundiced or pale.  Neurological:     Mental Status: He is alert.     Comments: Patient is oriented to person only, is a paucity of speech but he will answer yes and no questions Pupils are equal round and reactive, extraocular movements intact, face is symmetric 5-5 strength in the bilateral upper extremities, no pronator drift 4-5 strength in the bilateral lower extremities Able to participate in finger-nose-finger     LABS (all labs ordered are listed, but only abnormal results are displayed)  Labs Reviewed  BASIC METABOLIC PANEL - Abnormal; Notable for the following components:      Result Value   Sodium 134 (*)    CO2 19 (*)    Glucose, Bld 152 (*)    All other components within normal limits  CBC - Abnormal; Notable for the following components:   Hemoglobin 12.9 (*)    MCV 73.0 (*)    MCH 22.4 (*)    RDW 20.0 (*)    All other components within normal limits  RESP PANEL BY RT-PCR (FLU A&B, COVID) ARPGX2  PROTIME-INR  URINALYSIS, COMPLETE (UACMP) WITH MICROSCOPIC  APTT  DIFFERENTIAL  CBG MONITORING, ED  TROPONIN I (HIGH SENSITIVITY)  TROPONIN I (HIGH SENSITIVITY)   ____________________________________________  EKG  Sinus tachycardia, right bundle branch block, no acute ischemic changes ____________________________________________  RADIOLOGY Almeta Monas, personally viewed and evaluated these images (plain radiographs) as part of my medical decision making, as well as reviewing the written report by the radiologist.  ED MD interpretation:  I reviewed the CXR which does not show any acute cardiopulmonary process    I reviewed the  CT scan of the brain which does not show any acute intracranial process      ____________________________________________   PROCEDURES  Procedure(s) performed (including Critical Care):  Procedures   ____________________________________________   INITIAL  IMPRESSION / ASSESSMENT AND PLAN / ED COURSE     Patient is an 85 year old male who presents with altered mental status.  I actually was the provider who saw the patient in the ED who saw patient yesterday after an apparent episode of confusion and potentially right-sided weakness at his facility.  By the time I saw him he was back to baseline he was alert and oriented with a nonfocal exam and a CT head just demonstrating prior infarcts.  Today on my evaluation the patient is much different.  He is confused and somewhat somnolent.  Barely able to keep his eyes open.  He is oriented to self only.  There is a paucity of speech but he is really not aphasic.  He has global loss of strength in the bilateral upper extremities but this is nonfocal.  CT head was obtained which again does not demonstrate any acute pathology.  His labs are overall reassuring, we are still waiting for a UA but his troponin is negative no white count.  Vital signs notable for hypertension and tachycardia.  Overall I am not sure exactly what the cause of his altered mental status is.  I do feel like he should have an MRI to rule out an acute stroke but he is outside the window for tPA if this nonfocal exam do not feel that he needs to be a code stroke.  He had an EEG during his last hospitalization but I do question whether he could be postictal.  May need to have prolonged EEG as a not sure he was having an episode when this EEG was done.  Regardless given he is so significantly off of his baseline will admit to the hospital.      ____________________________________________   FINAL CLINICAL IMPRESSION(S) / ED DIAGNOSES  Final diagnoses:  Altered mental status,  unspecified altered mental status type     ED Discharge Orders     None        Note:  This document was prepared using Dragon voice recognition software and may include unintentional dictation errors.    Rada Hay, MD 11/09/20 2204

## 2020-11-09 NOTE — ED Triage Notes (Signed)
Pt. To ED via EMS for persistent high blood pressure, increased weakness, confusion, intermittent aphasia and vomited while in triage. Pt. Was seen in ED last night, diagnosed with "mini strokes". Pt. Is oriented x2,. Pt's daughter in law states that he has been having more frequent similar episode in the last few months, with increasing occurrence.

## 2020-11-10 ENCOUNTER — Encounter: Payer: Self-pay | Admitting: Internal Medicine

## 2020-11-10 ENCOUNTER — Observation Stay: Payer: Medicare Other

## 2020-11-10 DIAGNOSIS — G9341 Metabolic encephalopathy: Secondary | ICD-10-CM | POA: Diagnosis not present

## 2020-11-10 DIAGNOSIS — Z8673 Personal history of transient ischemic attack (TIA), and cerebral infarction without residual deficits: Secondary | ICD-10-CM

## 2020-11-10 DIAGNOSIS — I160 Hypertensive urgency: Secondary | ICD-10-CM

## 2020-11-10 LAB — CBG MONITORING, ED
Glucose-Capillary: 171 mg/dL — ABNORMAL HIGH (ref 70–99)
Glucose-Capillary: 128 mg/dL — ABNORMAL HIGH (ref 70–99)
Glucose-Capillary: 135 mg/dL — ABNORMAL HIGH (ref 70–99)
Glucose-Capillary: 137 mg/dL — ABNORMAL HIGH (ref 70–99)
Glucose-Capillary: 156 mg/dL — ABNORMAL HIGH (ref 70–99)

## 2020-11-10 LAB — LIPID PANEL
Cholesterol: 82 mg/dL (ref 0–200)
HDL: 29 mg/dL — ABNORMAL LOW (ref >40)
LDL Cholesterol: 30 mg/dL (ref 0–99)
Total CHOL/HDL Ratio: 2.8 RATIO
Triglycerides: 113 mg/dL (ref <150)
VLDL: 23 mg/dL (ref 0–40)

## 2020-11-10 LAB — URINALYSIS, COMPLETE (UACMP) WITH MICROSCOPIC
Bilirubin Urine: NEGATIVE
Glucose, UA: NEGATIVE mg/dL
Ketones, ur: NEGATIVE mg/dL
Leukocytes,Ua: NEGATIVE
Nitrite: NEGATIVE
Protein, ur: NEGATIVE mg/dL
Specific Gravity, Urine: 1.019 (ref 1.005–1.030)
pH: 5 (ref 5.0–8.0)

## 2020-11-10 LAB — CBC WITH DIFFERENTIAL/PLATELET
Abs Immature Granulocytes: 0.01 10*3/uL (ref 0.00–0.07)
Basophils Absolute: 0.1 10*3/uL (ref 0.0–0.1)
Basophils Relative: 1 %
Eosinophils Absolute: 0.2 10*3/uL (ref 0.0–0.5)
Eosinophils Relative: 3 %
HCT: 40.2 % (ref 39.0–52.0)
Hemoglobin: 12.9 g/dL — ABNORMAL LOW (ref 13.0–17.0)
Immature Granulocytes: 0 %
Lymphocytes Relative: 16 %
Lymphs Abs: 0.8 10*3/uL (ref 0.7–4.0)
MCH: 22.9 pg — ABNORMAL LOW (ref 26.0–34.0)
MCHC: 32.1 g/dL (ref 30.0–36.0)
MCV: 71.3 fL — ABNORMAL LOW (ref 80.0–100.0)
Monocytes Absolute: 0.6 10*3/uL (ref 0.1–1.0)
Monocytes Relative: 12 %
Neutro Abs: 3.5 10*3/uL (ref 1.7–7.7)
Neutrophils Relative %: 68 %
Platelets: 247 10*3/uL (ref 150–400)
RBC: 5.64 MIL/uL (ref 4.22–5.81)
RDW: 19.8 % — ABNORMAL HIGH (ref 11.5–15.5)
WBC: 5.1 10*3/uL (ref 4.0–10.5)
nRBC: 0 % (ref 0.0–0.2)

## 2020-11-10 LAB — HEMOGLOBIN A1C
Hgb A1c MFr Bld: 6.5 % — ABNORMAL HIGH (ref 4.8–5.6)
Mean Plasma Glucose: 139.85 mg/dL

## 2020-11-10 LAB — TROPONIN I (HIGH SENSITIVITY): Troponin I (High Sensitivity): 16 ng/L (ref <18)

## 2020-11-10 LAB — APTT: aPTT: 34 seconds (ref 24–36)

## 2020-11-10 MED ORDER — LORAZEPAM 2 MG/ML IJ SOLN: 1.0000 mg | Freq: Once | INTRAMUSCULAR | Status: AC

## 2020-11-10 MED ORDER — ISOSORBIDE MONONITRATE ER 30 MG PO TB24
30.0000 mg | ORAL_TABLET | Freq: Every evening | ORAL | Status: DC
  Administered 2020-11-10 – 2020-11-18 (×9): 30 mg via ORAL
  Filled 2020-11-10 (×9): qty 1

## 2020-11-10 MED ORDER — METOPROLOL SUCCINATE ER 25 MG PO TB24
25.0000 mg | ORAL_TABLET | Freq: Every evening | ORAL | Status: DC
  Administered 2020-11-10 – 2020-11-18 (×9): 25 mg via ORAL
  Filled 2020-11-10 (×10): qty 1

## 2020-11-10 MED ORDER — GADOBUTROL 1 MMOL/ML IV SOLN
7.5000 mL | Freq: Once | INTRAVENOUS | Status: AC | PRN
  Administered 2020-11-10: 7.5 mL via INTRAVENOUS

## 2020-11-10 MED ORDER — ASPIRIN EC 81 MG PO TBEC
81.0000 mg | DELAYED_RELEASE_TABLET | Freq: Two times a day (BID) | ORAL | Status: DC
  Administered 2020-11-10 – 2020-11-14 (×8): 81 mg via ORAL
  Filled 2020-11-10 (×10): qty 1

## 2020-11-10 MED ORDER — CLONIDINE HCL 0.3 MG/24HR TD PTWK: 0.3000 mg | MEDICATED_PATCH | TRANSDERMAL | Status: DC

## 2020-11-10 MED ORDER — VITAMIN B-12 1000 MCG PO TABS
1000.0000 ug | ORAL_TABLET | Freq: Every day | ORAL | Status: DC
  Administered 2020-11-12 – 2020-11-19 (×8): 1000 ug via ORAL
  Filled 2020-11-10 (×9): qty 1

## 2020-11-10 MED ORDER — LEVOTHYROXINE SODIUM 50 MCG PO TABS
50.0000 ug | ORAL_TABLET | Freq: Every day | ORAL | Status: DC
  Administered 2020-11-11 – 2020-11-18 (×8): 50 ug via ORAL
  Filled 2020-11-10 (×8): qty 1

## 2020-11-10 MED ORDER — FERROUS SULFATE 325 (65 FE) MG PO TABS
325.0000 mg | ORAL_TABLET | Freq: Every day | ORAL | Status: DC
  Administered 2020-11-12 – 2020-11-19 (×8): 325 mg via ORAL
  Filled 2020-11-10 (×10): qty 1

## 2020-11-10 MED ORDER — LISINOPRIL 20 MG PO TABS
40.0000 mg | ORAL_TABLET | Freq: Every day | ORAL | Status: DC
  Administered 2020-11-10 – 2020-11-18 (×9): 40 mg via ORAL
  Filled 2020-11-10 (×7): qty 2
  Filled 2020-11-10: qty 8
  Filled 2020-11-10: qty 2
  Filled 2020-11-10: qty 4

## 2020-11-10 MED ORDER — METOPROLOL TARTRATE 5 MG/5ML IV SOLN
5.0000 mg | Freq: Four times a day (QID) | INTRAVENOUS | Status: DC | PRN
  Administered 2020-11-10 – 2020-11-11 (×2): 5 mg via INTRAVENOUS
  Filled 2020-11-10 (×2): qty 5

## 2020-11-10 MED ORDER — PRAVASTATIN SODIUM 20 MG PO TABS
20.0000 mg | ORAL_TABLET | Freq: Every day | ORAL | Status: DC
  Administered 2020-11-10 – 2020-11-18 (×9): 20 mg via ORAL
  Filled 2020-11-10 (×9): qty 1

## 2020-11-10 MED ORDER — LORAZEPAM 2 MG/ML IJ SOLN
INTRAMUSCULAR | Status: AC
  Administered 2020-11-10: 1 mg via INTRAVENOUS
  Filled 2020-11-10: qty 1

## 2020-11-10 MED ORDER — SPIRONOLACTONE 25 MG PO TABS
25.0000 mg | ORAL_TABLET | Freq: Every day | ORAL | Status: DC
  Administered 2020-11-10 – 2020-11-18 (×9): 25 mg via ORAL
  Filled 2020-11-10 (×9): qty 1

## 2020-11-10 MED ORDER — ROPINIROLE HCL 1 MG PO TABS
1.0000 mg | ORAL_TABLET | Freq: Every day | ORAL | Status: DC
  Administered 2020-11-10 – 2020-11-18 (×9): 1 mg via ORAL
  Filled 2020-11-10 (×10): qty 1

## 2020-11-10 MED ORDER — LORATADINE 10 MG PO TABS
10.0000 mg | ORAL_TABLET | Freq: Every day | ORAL | Status: DC
  Administered 2020-11-10 – 2020-11-19 (×9): 10 mg via ORAL
  Filled 2020-11-10 (×10): qty 1

## 2020-11-10 MED ORDER — CLOPIDOGREL BISULFATE 75 MG PO TABS
75.0000 mg | ORAL_TABLET | Freq: Every day | ORAL | Status: DC
  Administered 2020-11-10 – 2020-11-19 (×9): 75 mg via ORAL
  Filled 2020-11-10 (×10): qty 1

## 2020-11-10 NOTE — Evaluation (Addendum)
Physical Therapy Evaluation Patient Details Name: Steve Ramirez MRN: [id number] DOB: [birth year] Today's Date: 11/10/2020  History of Present Illness  Pt is an 85 y/o M admitted on 11/09/20 after presenting with c/o AMS. Pt was recently hospitalized from 10/23-10/25 with TIA presenting with AMS with negative EEG & MRI. Pt returned to ED on 10/28 with c/o slurred speech that resolved upon arrival; head CT was negative & he was sent back to ALF. Pt was later found slumped over in his chair & stopped talking so was brought back to ED. He showed no focalization of symptoms & no seizure activity. MRI was negative for acute changes. PMH: HTN, DM, multiple CVAs & TIAs, melanoma  Clinical Impression  PT & OT found pt with BLE hanging off of end of bed, mittens thrown across room, & pt moving around in bed. PT & OT attempted to assist pt with repositioning in bed for increased safety & to prevent fall. Pt requires total assist +2 for supine>sit and demonstrates significant posterior lean requiring PT/OT to block BLE knees to prevent pt from sliding forward onto floor. PT & OT attempted to assist pt to standing to scoot hips to L higher up in bed but unsuccessful with one attempt. Pt requires +3 assist to safely return to supine & to scoot to [facility], +2 assist for rolling L<>R. Pt noted to have significantly elevated BP so further activity deferred. Nurse called to room & assisting pt & therapists. Pt left with BUE mittens donned.  Pt lethargic with very poor command following during session, resting soundly upon therapists' exit.   BP in LUE: 220/104 mmHg (MAP 135)  Of note, pt extremely different in regards to cognition & alertness as compared to last week when this PT saw him.     Recommendations for follow up therapy are one component of a multi-disciplinary discharge planning process, led by the attending physician.  Recommendations may be updated based on patient status, additional functional criteria  and insurance authorization.  Follow Up Recommendations Skilled nursing-short term rehab (<3 hours/day)    Assistance Recommended at Discharge Frequent or constant Supervision/Assistance  Functional Status Assessment Patient has had a recent decline in their functional status and demonstrates the ability to make significant improvements in function in a reasonable and predictable amount of time.  Equipment Recommendations  None recommended by PT    Recommendations for Other Services       Precautions / Restrictions Precautions Precautions: Fall Restrictions Weight Bearing Restrictions: No      Mobility  Bed Mobility Overal bed mobility: Needs Assistance Bed Mobility: Sit to Supine;Supine to Sit     Supine to sit: Total assist;+2 for physical assistance;+2 for safety/equipment Sit to supine: Total assist;+2 for physical assistance;+2 for safety/equipment        Transfers                        Ambulation/Gait                Stairs            Wheelchair Mobility    Modified Rankin (Stroke Patients Only)       Balance Overall balance assessment: Needs assistance;History of Falls Sitting-balance support: Bilateral upper extremity supported;Feet supported Sitting balance-Leahy Scale: Zero   Postural control: Posterior lean  Pertinent Vitals/Pain Pain Assessment: Faces Faces Pain Scale: No hurt    Home Living Family/patient expects to be discharged to:: Assisted living Living Arrangements: Spouse/significant other (Lives with his spouse in an ALF)   Type of Home: Assisted living Home Access: Level entry       Home Layout: One level Home Equipment: Standard Walker;Rollator (4 wheels);Grab bars - toilet;Grab bars - tub/shower;Shower seat      Prior Function               Mobility Comments: Per family, pt ambulated with a rollator, was just discharged from PT, but endorses at  least 5 falls in the past 6 months. States it's not uncommon for pt to bump into wall in ALF.       Hand Dominance        Extremity/Trunk Assessment   Upper Extremity Assessment Upper Extremity Assessment: Difficult to assess due to impaired cognition    Lower Extremity Assessment Lower Extremity Assessment: Difficult to assess due to impaired cognition    Cervical / Trunk Assessment Cervical / Trunk Assessment: Kyphotic  Communication   Communication: HOH  Cognition Arousal/Alertness: Lethargic Behavior During Therapy: Flat affect Overall Cognitive Status: History of cognitive impairments - at baseline Area of Impairment: Orientation;Memory;Following commands;Awareness;Safety/judgement;Problem solving;Attention                 Orientation Level: Disoriented to;Place;Time;Situation   Memory: Decreased short-term memory Following Commands: Follows one step commands inconsistently;Follows one step commands with increased time Safety/Judgement: Decreased awareness of deficits;Decreased awareness of safety Awareness: Intellectual Problem Solving: Slow processing;Requires tactile cues;Requires verbal cues;Difficulty sequencing;Decreased initiation          General Comments      Exercises     Assessment/Plan    PT Assessment Patient needs continued PT services  PT Problem List Decreased strength;Decreased mobility;Decreased safety awareness;Decreased cognition;Decreased activity tolerance;Decreased balance;Decreased knowledge of use of DME;Decreased knowledge of precautions       PT Treatment Interventions DME instruction;Therapeutic activities;Modalities;Cognitive remediation;Gait training;Therapeutic exercise;Patient/family education;Balance training;Functional mobility training;Neuromuscular re-education;Manual techniques    PT Goals (Current goals can be found in the Care Plan section)  Acute Rehab PT Goals PT Goal Formulation: Patient unable to participate  in goal setting Time For Goal Achievement: 11/24/20    Frequency Min 2X/week   Barriers to discharge        Co-evaluation PT/OT/SLP Co-Evaluation/Treatment: Yes Reason for Co-Treatment: Complexity of the patient's impairments (multi-system involvement);Necessary to address cognition/behavior during functional activity;For patient/therapist safety PT goals addressed during session: Mobility/safety with mobility;Balance         AM-PAC PT "6 Clicks" Mobility  Outcome Measure Help needed turning from your back to your side while in a flat bed without using bedrails?: Total Help needed moving from lying on your back to sitting on the side of a flat bed without using bedrails?: Total Help needed moving to and from a bed to a chair (including a wheelchair)?: Total Help needed standing up from a chair using your arms (e.g., wheelchair or bedside chair)?: Total Help needed to walk in hospital room?: Total Help needed climbing 3-5 steps with a railing? : Total 6 Click Score: 6    End of Session   Activity Tolerance: Patient limited by lethargy;Treatment limited secondary to medical complications (Comment) Patient left: in bed;with bed alarm set;with call bell/phone within reach Nurse Communication: Mobility status PT Visit Diagnosis: Unsteadiness on feet (R26.81);Muscle weakness (generalized) (M62.81);History of falling (Z91.81);Difficulty in walking, not elsewhere classified (R26.2)  Time: 8288-3374 PT Time Calculation (min) (ACUTE ONLY): 17 min   Charges:   PT Evaluation $PT Eval High Complexity: 1 High          Lavone Nian, PT, DPT 11/10/20, 2:04 PM   Waunita Schooner 11/10/2020, 2:04 PM

## 2020-11-10 NOTE — Evaluation (Signed)
Occupational Therapy Evaluation Patient Details Name: Steve Ramirez MRN: [id number] DOB: Feb 12, 1935 Today's Date: 11/10/2020   History of Present Illness Pt is an 85 y/o M admitted on 11/09/20 after presenting with c/o AMS. Pt was recently hospitalized from 10/23-10/25 with TIA presenting with AMS with negative EEG & MRI. Pt returned to ED on 10/28 with c/o slurred speech that resolved upon arrival; head CT was negative & he was sent back to ALF. Pt was later found slumped over in his chair & stopped talking so was brought back to ED. He showed no focalization of symptoms & no seizure activity. MRI was negative for acute changes. PMH: HTN, DM, multiple CVAs & TIAs, melanoma   Clinical Impression   Pt seen for OT evaluation this date. Session coordinated with PT to address functional transfers/ADLs and to address pt's cognition/behavior. Upon arrival to room, pt found supine in bed with BLE hanging off end of bed, mittens on floor across room, and pt moving around bed. Pt confused, only oriented to self, and with eyes closed for 90% of session. PLOF obtained from recent hospital admission (see below) d/t pt with impaired cognition and no family present during evaluation. OT & PT assisted pt with repositioning in bed to improve safety; pt required TOTAL A+2 for supine>sit transfer. Once seated EOB, pt required MAX A for static sitting balance d/t posterior lean. Pt required +3 assistance to return pt to supine and scoot toward HOB. Pt required MAX A to roll R&L, doff soiled briefs and change bed linens. Further mobility deferred in setting of pt with poor command follow and elevated BP. Pt left asleep in bed with RN present. Pt would benefit from additional skilled OT services to maximize return to PLOF and minimize risk of future falls, injury, caregiver burden, and readmission. Upon discharge, recommend SNF.        Recommendations for follow up therapy are one component of a multi-disciplinary  discharge planning process, led by the attending physician.  Recommendations may be updated based on patient status, additional functional criteria and insurance authorization.   Follow Up Recommendations  Skilled nursing-short term rehab (<3 hours/day)    Assistance Recommended at Discharge Frequent or constant Supervision/Assistance  Functional Status Assessment  Patient has had a recent decline in their functional status and demonstrates the ability to make significant improvements in function in a reasonable and predictable amount of time.  Equipment Recommendations  Other (comment) (defer to next venue of care)       Precautions / Restrictions Precautions Precautions: Fall Restrictions Weight Bearing Restrictions: No      Mobility Bed Mobility Overal bed mobility: Needs Assistance Bed Mobility: Sit to Supine;Supine to Sit     Supine to sit: Total assist;+2 for physical assistance;+2 for safety/equipment Sit to supine: Total assist;+2 for physical assistance;+2 for safety/equipment                          Balance Overall balance assessment: Needs assistance;History of Falls Sitting-balance support: Bilateral upper extremity supported;Feet supported Sitting balance-Leahy Scale: Zero Sitting balance - Comments: Pt required MAX A to maintain static sitting balance Postural control: Posterior lean                                 ADL either performed or assessed with clinical judgement   ADL Overall ADL's : Needs assistance/impaired  General ADL Comments: MAX A for UB/LB bed-level ADLs d/t impaired cognition      Pertinent Vitals/Pain Pain Assessment: Faces Faces Pain Scale: No hurt        Extremity/Trunk Assessment Upper Extremity Assessment Upper Extremity Assessment: Difficult to assess due to impaired cognition   Lower Extremity Assessment Lower Extremity Assessment: Difficult to  assess due to impaired cognition   Cervical / Trunk Assessment Cervical / Trunk Assessment: Kyphotic   Communication Communication Communication: HOH   Cognition Arousal/Alertness: Lethargic Behavior During Therapy: Flat affect Overall Cognitive Status: History of cognitive impairments - at baseline Area of Impairment: Orientation;Memory;Following commands;Awareness;Safety/judgement;Problem solving;Attention                 Orientation Level: Disoriented to;Place;Time;Situation   Memory: Decreased short-term memory Following Commands: Follows one step commands inconsistently;Follows one step commands with increased time Safety/Judgement: Decreased awareness of deficits;Decreased awareness of safety Awareness: Intellectual Problem Solving: Slow processing;Requires tactile cues;Requires verbal cues;Difficulty sequencing;Decreased initiation General Comments: pt with eyes closed 90% of session. Poor command follow     General Comments  BP at end of session: 220/104 mmHg (MAP 135). Pt left in bed, in no acute distress with RN present            Home Living Family/patient expects to be discharged to:: Assisted living Living Arrangements: Spouse/significant other (Lives with his spouse in an ALF)   Type of Home: Assisted living Home Access: Level entry     Home Layout: One level               Home Equipment: Standard Walker;Rollator (4 wheels);Grab bars - toilet;Grab bars - tub/shower;Shower seat          Prior Functioning/Environment               Mobility Comments: Per family, pt ambulated with a rollator, was just discharged from PT, but endorses at least 5 falls in the past 6 months. States it's not uncommon for pt to bump into wall in ALF. ADLs Comments: Per chart review, pt indep with dressing and recieves occassional assist for bathing.        OT Problem List: Decreased strength;Decreased cognition;Impaired balance (sitting and/or  standing);Decreased activity tolerance;Decreased knowledge of use of DME or AE      OT Treatment/Interventions: Self-care/ADL training;Therapeutic exercise;Therapeutic activities;DME and/or AE instruction;Patient/family education;Balance training    OT Goals(Current goals can be found in the care plan section) Acute Rehab OT Goals OT Goal Formulation: Patient unable to participate in goal setting Time For Goal Achievement: 11/24/20 ADL Goals Pt Will Perform Grooming: with min assist;sitting Pt Will Perform Upper Body Dressing: with min assist;sitting Pt Will Transfer to Toilet: with mod assist;stand pivot transfer;bedside commode  OT Frequency: Min 1X/week           Co-evaluation PT/OT/SLP Co-Evaluation/Treatment: Yes Reason for Co-Treatment: Complexity of the patient's impairments (multi-system involvement);Necessary to address cognition/behavior during functional activity;For patient/therapist safety PT goals addressed during session: Mobility/safety with mobility;Balance OT goals addressed during session: ADL's and self-care      AM-PAC OT "6 Clicks" Daily Activity     Outcome Measure Help from another person eating meals?: A Lot Help from another person taking care of personal grooming?: A Lot Help from another person toileting, which includes using toliet, bedpan, or urinal?: A Lot Help from another person bathing (including washing, rinsing, drying)?: A Lot Help from another person to put on and taking off regular upper body clothing?: A Little Help from another person  to put on and taking off regular lower body clothing?: A Lot 6 Click Score: 13   End of Session Nurse Communication: Mobility status  Activity Tolerance: Patient limited by lethargy Patient left: in bed;with bed alarm set;with call bell/phone within reach;with nursing/sitter in room  OT Visit Diagnosis: Other abnormalities of gait and mobility (R26.89);Repeated falls (R29.6);Muscle weakness (generalized)  (M62.81)                Time: 1423-9532 OT Time Calculation (min): 17 min Charges:  OT General Charges $OT Visit: 1 Visit OT Evaluation $OT Eval Moderate Complexity: [place], OTR/L [place]

## 2020-11-10 NOTE — Progress Notes (Addendum)
Progress Note    Steve Ramirez  BTC:[id number] DOB: 04-Jul-1935  DOA: 11/09/2020 PCP: Sharilyn Sites, MD      Brief Narrative:    Medical records reviewed and are as summarized below:  Steve Ramirez is a 85 y.o. male with medical history significant for multiple strokes, recently discharged from the hospital on 11/05/2020 for TIA, hypertension, GI bleed, type 2 diabetes mellitus, hyperlipidemia, history of melanoma, who was brought to the hospital because of altered mental status.  He initially presented to the emergency room on 11/08/2020 with slurred speech.  CT head did not show any acute abnormality and was discharged back to the assisted living facility.  On 11/09/2020 around 5 PM, he was found slumped in his chair and was brought back to the hospital.  He was admitted to the hospital for acute confusional state.  Etiology of change in mental status is not clear.  MRI brain with and without contrast did not show any acute abnormality.  He also had hypertensive urgency which was treated with  antihypertensives.    Assessment/Plan:   Principal Problem:   Acute metabolic encephalopathy Active Problems:   Diabetes mellitus without complication (HCC)   History of TIAs   Essential hypertension   Hypothyroidism   CAD (coronary artery disease)   Hypertensive urgency    Body mass index is 27.37 kg/m.  Acute confusional state: Etiology is unclear.  MRI brain with and without contrast did not show any acute abnormality.   CAD, history of multiple TIAs and stroke: Continue aspirin, pravastatin Plavix.  Hypertensive urgency: Continue home antihypertensives.  IV metoprolol as needed for severe hypertension.  Chronic diastolic CHF: Compensated.  Continue antihypertensives.  2D echo on 10/24/2020 showed normal EF and grade 2 diastolic dysfunction.  Type II DM: Hemoglobin A1c 6.5.  NovoLog as needed for hyperglycemia.   Diet Order             Diet heart  healthy/carb modified Room service appropriate? Yes; Fluid consistency: Thin  Diet effective now                      Consultants: None  Procedures: None    Medications:     stroke: mapping our early stages of recovery book   Does not apply Once   aspirin EC  81 mg Oral BID   [START ON 11/16/2020] cloNIDine  0.3 mg Transdermal Q Sat   clopidogrel  75 mg Oral Daily   enoxaparin (LOVENOX) injection  40 mg Subcutaneous Q24H   [START ON 11/11/2020] ferrous sulfate  325 mg Oral Q breakfast   insulin aspart  0-15 Units Subcutaneous TID WC   insulin aspart  0-5 Units Subcutaneous QHS   isosorbide mononitrate  30 mg Oral QPM   [START ON 11/11/2020] levothyroxine  50 mcg Oral Q0600   lisinopril  40 mg Oral QHS   loratadine  10 mg Oral Daily   metoprolol succinate  25 mg Oral QPM   pravastatin  20 mg Oral QHS   rOPINIRole  1 mg Oral QHS   spironolactone  25 mg Oral QHS   [START ON 11/11/2020] vitamin B-12  1,000 mcg Oral Daily   Continuous Infusions:  sodium chloride 100 mL/hr at 11/10/20 1144     Anti-infectives (From admission, onward)    None              Family Communication/Anticipated D/C date and plan/Code Status   DVT prophylaxis: enoxaparin (LOVENOX)  injection 40 mg Start: 11/09/20 2300     Code Status: Full Code  Family Communication: None Disposition Plan: He will be discharged back to SNF when medically ready   Status is: Observation  The patient will require care spanning > 2 midnights and should be moved to inpatient because: Uncontrolled hypertension, altered mental status          Subjective:   Interval events noted.  Patient is confused and unable to provide any history.  MRI tech was at the bedside.  Objective:    Vitals:   11/10/20 1330 11/10/20 1345 11/10/20 1400 11/10/20 1533  BP: (!) 225/100 (!) 220/104 (!) 219/95 (!) 206/95  Pulse:  88 86 92  Resp: 19 18 16    Temp:      TempSrc:      SpO2: 90% 95% 94%    Weight:      Height:       No data found.   Intake/Output Summary (Last 24 hours) at 11/10/2020 1554 Last data filed at 11/10/2020 1144 Gross per 24 hour  Intake --  Output 1050 ml  Net -1050 ml   Filed Weights   11/09/20 1955  Weight: 81.6 kg    Exam:  GEN: NAD SKIN: Warm and dry EYES: No pallor or icterus ENT: MMM CV: RRR PULM: CTA B ABD: soft, ND, NT, +BS CNS: AAO x 1 (person), non focal EXT: No edema or tenderness        Data Reviewed:   I have personally reviewed following labs and imaging studies:  Labs: Labs show the following:   Basic Metabolic Panel: Recent Labs  Lab 11/03/20 2152 11/08/20 1124 11/09/20 2003  NA  --  139 134*  K  --  4.5 5.0  CL  --  109 105  CO2  --  24 19*  GLUCOSE  --  120* 152*  BUN  --  20 16  CREATININE 0.77 0.75 0.80  CALCIUM  --  9.7 9.3   GFR Estimated Creatinine Clearance: 67.7 mL/min (by C-G formula based on SCr of 0.8 mg/dL). Liver Function Tests: Recent Labs  Lab 11/08/20 1124  AST 17  ALT 18  ALKPHOS 61  BILITOT 0.8  PROT 7.5  ALBUMIN 4.6   No results for input(s): LIPASE, AMYLASE in the last 168 hours. Recent Labs  Lab 11/04/20 0359  AMMONIA 22   Coagulation profile Recent Labs  Lab 11/08/20 1124 11/09/20 2003  INR 1.1 1.1    CBC: Recent Labs  Lab 11/03/20 2152 11/08/20 1124 11/09/20 2003  WBC 7.1 5.9 5.1  6.5  NEUTROABS  --  3.4 3.5  HGB 12.5* 12.5* 12.9*  12.9*  HCT 39.9 40.8 40.2  42.1  MCV 71.9* 72.6* 71.3*  73.0*  PLT 288 287 247  253   Cardiac Enzymes: No results for input(s): CKTOTAL, CKMB, CKMBINDEX, TROPONINI in the last 168 hours. BNP (last 3 results) No results for input(s): PROBNP in the last 8760 hours. CBG: Recent Labs  Lab 11/05/20 0732 11/05/20 1223 11/09/20 2000 11/10/20 0726 11/10/20 1058  GLUCAP 114* 166* 171* 128* 135*   D-Dimer: No results for input(s): DDIMER in the last 72 hours. Hgb A1c: Recent Labs    11/10/20 0631  HGBA1C 6.5*    Lipid Profile: Recent Labs    11/10/20 0631  CHOL 82  HDL 29*  LDLCALC 30  TRIG 113  CHOLHDL 2.8   Thyroid function studies: No results for input(s): TSH, T4TOTAL, T3FREE, THYROIDAB in the last  72 hours.  Invalid input(s): FREET3 Anemia work up: No results for input(s): VITAMINB12, FOLATE, FERRITIN, TIBC, IRON, RETICCTPCT in the last 72 hours. Sepsis Labs: Recent Labs  Lab 11/03/20 2152 11/08/20 1124 11/09/20 2003  WBC 7.1 5.9 5.1  6.5    Microbiology Recent Results (from the past 240 hour(s))  Resp Panel by RT-PCR (Flu A&B, Covid) Nasopharyngeal Swab     Status: None   Collection Time: 11/03/20  6:55 PM   Specimen: Nasopharyngeal Swab; Nasopharyngeal(NP) swabs in vial transport medium  Result Value Ref Range Status   SARS Coronavirus 2 by RT PCR NEGATIVE NEGATIVE Final    Comment: (NOTE) SARS-CoV-2 target nucleic acids are NOT DETECTED.  The SARS-CoV-2 RNA is generally detectable in upper respiratory specimens during the acute phase of infection. The lowest concentration of SARS-CoV-2 viral copies this assay can detect is 138 copies/mL. A negative result does not preclude SARS-Cov-2 infection and should not be used as the sole basis for treatment or other patient management decisions. A negative result may occur with  improper specimen collection/handling, submission of specimen other than nasopharyngeal swab, presence of viral mutation(s) within the areas targeted by this assay, and inadequate number of viral copies(<138 copies/mL). A negative result must be combined with clinical observations, patient history, and epidemiological information. The expected result is Negative.  Fact Sheet for Patients:  EntrepreneurPulse.com.au  Fact Sheet for Healthcare Providers:  IncredibleEmployment.be  This test is no t yet approved or cleared by the [place] FDA and  has been authorized for detection and/or diagnosis of  SARS-CoV-2 by FDA under an Emergency Use Authorization (EUA). This EUA will remain  in effect (meaning this test can be used) for the duration of the COVID-19 declaration under Section 564(b)(1) of the Act, 21 U.S.C.section 360bbb-3(b)(1), unless the authorization is terminated  or revoked sooner.       Influenza A by PCR NEGATIVE NEGATIVE Final   Influenza B by PCR NEGATIVE NEGATIVE Final    Comment: (NOTE) The Xpert Xpress SARS-CoV-2/FLU/RSV plus assay is intended as an aid in the diagnosis of influenza from Nasopharyngeal swab specimens and should not be used as a sole basis for treatment. Nasal washings and aspirates are unacceptable for Xpert Xpress SARS-CoV-2/FLU/RSV testing.  Fact Sheet for Patients: EntrepreneurPulse.com.au  Fact Sheet for Healthcare Providers: IncredibleEmployment.be  This test is not yet approved or cleared by the [place] FDA and has been authorized for detection and/or diagnosis of SARS-CoV-2 by FDA under an Emergency Use Authorization (EUA). This EUA will remain in effect (meaning this test can be used) for the duration of the COVID-19 declaration under Section 564(b)(1) of the Act, 21 U.S.C. section 360bbb-3(b)(1), unless the authorization is terminated or revoked.  Performed at [facility], [address] [place], [place] [address]   Resp Panel by RT-PCR (Flu A&B, Covid) Nasopharyngeal Swab     Status: None   Collection Time: 11/08/20 11:24 AM   Specimen: Nasopharyngeal Swab; Nasopharyngeal(NP) swabs in vial transport medium  Result Value Ref Range Status   SARS Coronavirus 2 by RT PCR NEGATIVE NEGATIVE Final    Comment: (NOTE) SARS-CoV-2 target nucleic acids are NOT DETECTED.  The SARS-CoV-2 RNA is generally detectable in upper respiratory specimens during the acute phase of infection. The lowest concentration of SARS-CoV-2 viral copies this assay can detect is 138 copies/mL. A negative  result does not preclude SARS-Cov-2 infection and should not be used as the sole basis for treatment or other patient management decisions. A negative  result may occur with  improper specimen collection/handling, submission of specimen other than nasopharyngeal swab, presence of viral mutation(s) within the areas targeted by this assay, and inadequate number of viral copies(<138 copies/mL). A negative result must be combined with clinical observations, patient history, and epidemiological information. The expected result is Negative.  Fact Sheet for Patients:  EntrepreneurPulse.com.au  Fact Sheet for Healthcare Providers:  IncredibleEmployment.be  This test is no t yet approved or cleared by the [place] FDA and  has been authorized for detection and/or diagnosis of SARS-CoV-2 by FDA under an Emergency Use Authorization (EUA). This EUA will remain  in effect (meaning this test can be used) for the duration of the COVID-19 declaration under Section 564(b)(1) of the Act, 21 U.S.C.section 360bbb-3(b)(1), unless the authorization is terminated  or revoked sooner.       Influenza A by PCR NEGATIVE NEGATIVE Final   Influenza B by PCR NEGATIVE NEGATIVE Final    Comment: (NOTE) The Xpert Xpress SARS-CoV-2/FLU/RSV plus assay is intended as an aid in the diagnosis of influenza from Nasopharyngeal swab specimens and should not be used as a sole basis for treatment. Nasal washings and aspirates are unacceptable for Xpert Xpress SARS-CoV-2/FLU/RSV testing.  Fact Sheet for Patients: EntrepreneurPulse.com.au  Fact Sheet for Healthcare Providers: IncredibleEmployment.be  This test is not yet approved or cleared by the [place] FDA and has been authorized for detection and/or diagnosis of SARS-CoV-2 by FDA under an Emergency Use Authorization (EUA). This EUA will remain in effect (meaning this test can be used)  for the duration of the COVID-19 declaration under Section 564(b)(1) of the Act, 21 U.S.C. section 360bbb-3(b)(1), unless the authorization is terminated or revoked.  Performed at [facility], [address] [place], [place] [address]   Resp Panel by RT-PCR (Flu A&B, Covid) Nasopharyngeal Swab     Status: None   Collection Time: 11/09/20  9:14 PM   Specimen: Nasopharyngeal Swab; Nasopharyngeal(NP) swabs in vial transport medium  Result Value Ref Range Status   SARS Coronavirus 2 by RT PCR NEGATIVE NEGATIVE Final    Comment: (NOTE) SARS-CoV-2 target nucleic acids are NOT DETECTED.  The SARS-CoV-2 RNA is generally detectable in upper respiratory specimens during the acute phase of infection. The lowest concentration of SARS-CoV-2 viral copies this assay can detect is 138 copies/mL. A negative result does not preclude SARS-Cov-2 infection and should not be used as the sole basis for treatment or other patient management decisions. A negative result may occur with  improper specimen collection/handling, submission of specimen other than nasopharyngeal swab, presence of viral mutation(s) within the areas targeted by this assay, and inadequate number of viral copies(<138 copies/mL). A negative result must be combined with clinical observations, patient history, and epidemiological information. The expected result is Negative.  Fact Sheet for Patients:  EntrepreneurPulse.com.au  Fact Sheet for Healthcare Providers:  IncredibleEmployment.be  This test is no t yet approved or cleared by the [place] FDA and  has been authorized for detection and/or diagnosis of SARS-CoV-2 by FDA under an Emergency Use Authorization (EUA). This EUA will remain  in effect (meaning this test can be used) for the duration of the COVID-19 declaration under Section 564(b)(1) of the Act, 21 U.S.C.section 360bbb-3(b)(1), unless the authorization is terminated   or revoked sooner.       Influenza A by PCR NEGATIVE NEGATIVE Final   Influenza B by PCR NEGATIVE NEGATIVE Final    Comment: (NOTE) The Xpert Xpress SARS-CoV-2/FLU/RSV plus assay is intended  as an aid in the diagnosis of influenza from Nasopharyngeal swab specimens and should not be used as a sole basis for treatment. Nasal washings and aspirates are unacceptable for Xpert Xpress SARS-CoV-2/FLU/RSV testing.  Fact Sheet for Patients: EntrepreneurPulse.com.au  Fact Sheet for Healthcare Providers: IncredibleEmployment.be  This test is not yet approved or cleared by the [place] FDA and has been authorized for detection and/or diagnosis of SARS-CoV-2 by FDA under an Emergency Use Authorization (EUA). This EUA will remain in effect (meaning this test can be used) for the duration of the COVID-19 declaration under Section 564(b)(1) of the Act, 21 U.S.C. section 360bbb-3(b)(1), unless the authorization is terminated or revoked.  Performed at [facility], [address] [place],  [address]     Procedures and diagnostic studies:  DG Chest 1 View  Result Date: 11/09/2020 CLINICAL DATA:  Hypertension EXAM: CHEST  1 VIEW COMPARISON:  11/03/2020 FINDINGS: Lungs volumes are small, but are symmetric and are clear. No pneumothorax or pleural effusion. Cardiac size within normal limits. Pulmonary vascularity is normal. Osseous structures are age-appropriate. No acute bone abnormality. IMPRESSION: Pulmonary hypoinflation Electronically Signed   By: Fidela Salisbury M.D.   On: 11/09/2020 20:48   CT HEAD WO CONTRAST  Result Date: 11/09/2020 CLINICAL DATA:  Weakness and vomiting. EXAM: CT HEAD WITHOUT CONTRAST TECHNIQUE: Contiguous axial images were obtained from the base of the skull through the vertex without intravenous contrast. COMPARISON:  November 08, 2020 FINDINGS: Brain: There is mild cerebral atrophy with widening of the  extra-axial spaces and ventricular dilatation. There are areas of decreased attenuation within the white matter tracts of the supratentorial brain, consistent with microvascular disease changes. Small, chronic bilateral basal ganglia lacunar infarcts are seen. Vascular: No hyperdense vessel or unexpected calcification. Skull: Normal. Negative for fracture or focal lesion. Sinuses/Orbits: No acute finding. Other: None. IMPRESSION: 1. Generalized cerebral atrophy. 2. Small, chronic bilateral basal ganglia lacunar infarcts. 3. No acute intracranial abnormality. Electronically Signed   By: Virgina Norfolk M.D.   On: 11/09/2020 20:44   MR BRAIN WO CONTRAST  Result Date: 11/09/2020 CLINICAL DATA:  Follow-up examination for acute stroke. EXAM: MRI HEAD WITHOUT CONTRAST TECHNIQUE: Multiplanar, multiecho pulse sequences of the brain and surrounding structures were obtained without intravenous contrast. COMPARISON:  Prior head CT from earlier the same day as well as recent MRI from 11/03/2020. FINDINGS: Brain: Diffuse prominence of the CSF containing spaces compatible generalized age-related cerebral atrophy. Patchy and confluent T2/FLAIR hyperintensity involving the periventricular and deep white matter both cerebral hemispheres most consistent with chronic small vessel ischemic disease, moderately advanced in nature. Few scattered remote lacunar infarcts present about the posterior limb of the left internal capsule, left thalamus, bilateral basal ganglia, and right pons. No abnormal foci of restricted diffusion to suggest acute or subacute ischemia. Gray-white matter differentiation otherwise maintained. No encephalomalacia to suggest chronic cortical infarction. No acute or chronic intracranial hemorrhage. No mass lesion, midline shift or mass effect. No hydrocephalus or extra-axial fluid collection. Pituitary gland suprasellar region within normal limits. Midline structures intact. Vascular: Major intracranial  vascular flow voids are maintained. Skull and upper cervical spine: Craniocervical junction within normal limits. Bone marrow signal intensity normal. No scalp soft tissue abnormality. Sinuses/Orbits: Globes and orbital soft tissues demonstrate no acute finding. Mild mucosal thickening noted within the ethmoidal air cells and maxillary sinuses. Paranasal sinuses are otherwise clear. Trace left mastoid effusion noted, of doubtful significance. Inner ear structures grossly normal. Other: None. IMPRESSION: 1. No acute intracranial  abnormality. 2. Moderately advanced cerebral atrophy with chronic microvascular ischemic disease, with a few scattered remote lacunar infarcts involving the posterior limb of the left internal capsule, left thalamus, bilateral basal ganglia, and right pons. Electronically Signed   By: Jeannine Boga M.D.   On: 11/09/2020 23:26   MR BRAIN W CONTRAST  Result Date: 11/10/2020 CLINICAL DATA:  Seizure with abnormal neuro exam EXAM: MRI HEAD WITH CONTRAST TECHNIQUE: Multiplanar, multiecho pulse sequences of the brain and surrounding structures were obtained with intravenous contrast. CONTRAST:  7.61mL GADAVIST GADOBUTROL 1 MMOL/ML IV SOLN COMPARISON:  Noncontrast brain MRI from yesterday FINDINGS: Brain: No abnormal enhancement. Parenchymal changes as described on full brain MRI yesterday. No interval finding. Vascular: Major vessels are enhancing. Skull and upper cervical spine: No abnormal enhancement Sinuses/Orbits: Negative IMPRESSION: No abnormal enhancement. Electronically Signed   By: Jorje Guild M.D.   On: 11/10/2020 11:02               LOS: 0 days   Demetre Monaco  [facility]   Pager on [email or web address]. If 7PM-7AM, please contact night-coverage at [email or web address]     11/10/2020, 3:54 PM

## 2020-11-10 NOTE — ED Notes (Signed)
Patient noted to have eyes closed, but pulling at blankets and monitoring equipment.  Unable to redirect.  Patient was incontinent of urine.  Clean sheets and external catheter placed.

## 2020-11-10 NOTE — ED Notes (Signed)
Patient transported to MRI 

## 2020-11-10 NOTE — Progress Notes (Signed)
SLP Cancellation Note  Patient Details Name: ZACHARIAH PAVEK MRN: [id number] DOB: 06-28-1935   Cancelled treatment:       Reason Eval/Treat Not Completed: Patient at procedure or test/unavailable (pt OTF for MRI). SLP consult received and appreciated. Chart review completed. Will continue efforts, as appropriate.   Quintella Baton 11/10/2020, 10:43 AM

## 2020-11-11 DIAGNOSIS — I110 Hypertensive heart disease with heart failure: Secondary | ICD-10-CM | POA: Diagnosis present

## 2020-11-11 DIAGNOSIS — I2510 Atherosclerotic heart disease of native coronary artery without angina pectoris: Secondary | ICD-10-CM | POA: Diagnosis present

## 2020-11-11 DIAGNOSIS — Z7902 Long term (current) use of antithrombotics/antiplatelets: Secondary | ICD-10-CM | POA: Diagnosis not present

## 2020-11-11 DIAGNOSIS — I69398 Other sequelae of cerebral infarction: Secondary | ICD-10-CM | POA: Diagnosis not present

## 2020-11-11 DIAGNOSIS — J101 Influenza due to other identified influenza virus with other respiratory manifestations: Secondary | ICD-10-CM | POA: Diagnosis not present

## 2020-11-11 DIAGNOSIS — I5032 Chronic diastolic (congestive) heart failure: Secondary | ICD-10-CM | POA: Diagnosis present

## 2020-11-11 DIAGNOSIS — R488 Other symbolic dysfunctions: Secondary | ICD-10-CM | POA: Diagnosis present

## 2020-11-11 DIAGNOSIS — R4182 Altered mental status, unspecified: Secondary | ICD-10-CM | POA: Diagnosis present

## 2020-11-11 DIAGNOSIS — Z96643 Presence of artificial hip joint, bilateral: Secondary | ICD-10-CM | POA: Diagnosis present

## 2020-11-11 DIAGNOSIS — Z8249 Family history of ischemic heart disease and other diseases of the circulatory system: Secondary | ICD-10-CM | POA: Diagnosis not present

## 2020-11-11 DIAGNOSIS — Z808 Family history of malignant neoplasm of other organs or systems: Secondary | ICD-10-CM | POA: Diagnosis not present

## 2020-11-11 DIAGNOSIS — E785 Hyperlipidemia, unspecified: Secondary | ICD-10-CM | POA: Diagnosis present

## 2020-11-11 DIAGNOSIS — Z8673 Personal history of transient ischemic attack (TIA), and cerebral infarction without residual deficits: Secondary | ICD-10-CM | POA: Diagnosis not present

## 2020-11-11 DIAGNOSIS — R4701 Aphasia: Secondary | ICD-10-CM | POA: Diagnosis present

## 2020-11-11 DIAGNOSIS — E119 Type 2 diabetes mellitus without complications: Secondary | ICD-10-CM | POA: Diagnosis present

## 2020-11-11 DIAGNOSIS — I10 Essential (primary) hypertension: Secondary | ICD-10-CM | POA: Diagnosis not present

## 2020-11-11 DIAGNOSIS — Z825 Family history of asthma and other chronic lower respiratory diseases: Secondary | ICD-10-CM | POA: Diagnosis not present

## 2020-11-11 DIAGNOSIS — R1110 Vomiting, unspecified: Secondary | ICD-10-CM | POA: Diagnosis present

## 2020-11-11 DIAGNOSIS — L89311 Pressure ulcer of right buttock, stage 1: Secondary | ICD-10-CM | POA: Diagnosis present

## 2020-11-11 DIAGNOSIS — L89321 Pressure ulcer of left buttock, stage 1: Secondary | ICD-10-CM | POA: Diagnosis present

## 2020-11-11 DIAGNOSIS — Z85820 Personal history of malignant melanoma of skin: Secondary | ICD-10-CM | POA: Diagnosis not present

## 2020-11-11 DIAGNOSIS — E039 Hypothyroidism, unspecified: Secondary | ICD-10-CM | POA: Diagnosis present

## 2020-11-11 DIAGNOSIS — I160 Hypertensive urgency: Secondary | ICD-10-CM | POA: Diagnosis present

## 2020-11-11 DIAGNOSIS — Z981 Arthrodesis status: Secondary | ICD-10-CM | POA: Diagnosis not present

## 2020-11-11 DIAGNOSIS — G9341 Metabolic encephalopathy: Secondary | ICD-10-CM | POA: Diagnosis present

## 2020-11-11 DIAGNOSIS — R531 Weakness: Secondary | ICD-10-CM | POA: Diagnosis present

## 2020-11-11 DIAGNOSIS — Z20822 Contact with and (suspected) exposure to covid-19: Secondary | ICD-10-CM | POA: Diagnosis present

## 2020-11-11 LAB — GLUCOSE, CAPILLARY
Glucose-Capillary: 150 mg/dL — ABNORMAL HIGH (ref 70–99)
Glucose-Capillary: 128 mg/dL — ABNORMAL HIGH (ref 70–99)

## 2020-11-11 LAB — CBG MONITORING, ED
Glucose-Capillary: 194 mg/dL — ABNORMAL HIGH (ref 70–99)
Glucose-Capillary: 177 mg/dL — ABNORMAL HIGH (ref 70–99)

## 2020-11-11 MED ORDER — LABETALOL HCL 5 MG/ML IV SOLN
20.0000 mg | Freq: Once | INTRAVENOUS | Status: AC
  Administered 2020-11-11: 20 mg via INTRAVENOUS
  Filled 2020-11-11: qty 4

## 2020-11-11 MED ORDER — LABETALOL HCL 5 MG/ML IV SOLN
10.0000 mg | INTRAVENOUS | Status: DC | PRN
  Filled 2020-11-11 (×3): qty 4

## 2020-11-11 MED ORDER — AMLODIPINE BESYLATE 10 MG PO TABS
10.0000 mg | ORAL_TABLET | Freq: Every day | ORAL | Status: DC
  Administered 2020-11-11 – 2020-11-19 (×9): 10 mg via ORAL
  Filled 2020-11-11 (×6): qty 1
  Filled 2020-11-11: qty 2
  Filled 2020-11-11 (×2): qty 1

## 2020-11-11 MED ORDER — HYDRALAZINE HCL 20 MG/ML IJ SOLN
20.0000 mg | Freq: Once | INTRAMUSCULAR | Status: AC
  Administered 2020-11-11: 20 mg via INTRAVENOUS
  Filled 2020-11-11: qty 1

## 2020-11-11 MED ORDER — CLONIDINE HCL 0.3 MG/24HR TD PTWK
0.3000 mg | MEDICATED_PATCH | TRANSDERMAL | Status: DC
  Administered 2020-11-11: 0.3 mg via TRANSDERMAL
  Filled 2020-11-11 (×2): qty 1

## 2020-11-11 NOTE — Progress Notes (Addendum)
Progress Note    Steve Ramirez  JKD:[id number] DOB: Sep 15, 1935  DOA: 11/09/2020 PCP: Sharilyn Sites, MD      Brief Narrative:    Medical records reviewed and are as summarized below:  Steve Ramirez is a 85 y.o. male with medical history significant for multiple strokes, recently discharged from the hospital on 11/05/2020 for TIA, hypertension, GI bleed, type 2 diabetes mellitus, hyperlipidemia, history of melanoma, who was brought to the hospital because of altered mental status.  He initially presented to the emergency room on 11/08/2020 with slurred speech.  CT head did not show any acute abnormality and was discharged back to the assisted living facility.  On 11/09/2020 around 5 PM, he was found slumped in his chair and was brought back to the hospital.  He was admitted to the hospital for acute confusional state.  Etiology of change in mental status is not clear.  MRI brain with and without contrast did not show any acute abnormality.  He also had hypertensive urgency which was treated with  antihypertensives.    Assessment/Plan:   Principal Problem:   Acute metabolic encephalopathy Active Problems:   Diabetes mellitus without complication (HCC)   History of TIAs   Essential hypertension   Hypothyroidism   CAD (coronary artery disease)   Hypertensive urgency    Body mass index is 27.37 kg/m.  Acute confusional state, probable underlying dementia: Etiology is unclear.  MRI brain with and without contrast did not show any acute abnormality.  His son, [name], said he has noticed gradual change in mental status/confusion about 6 weeks prior to admission. Hold oral medications when patient is rather confused unable to swallow.  Consult speech therapist to assess swallowing.  CAD, history of multiple TIAs and stroke: Continue aspirin, Plavix and pravastatin as able.    Hypertensive urgency: Continue home antihypertensives.  Oral amlodipine has been added.  Use  IV metoprolol/hydralazine for severe hypertension.  Chronic diastolic CHF: Compensated.  Continue antihypertensives.  2D echo on 10/24/2020 showed normal EF and grade 2 diastolic dysfunction.  Type II DM: Hemoglobin A1c 6.5.  NovoLog as needed for hyperglycemia.   Diet Order             Diet heart healthy/carb modified Room service appropriate? Yes; Fluid consistency: Thin  Diet effective now                      Consultants: Neurologist  Procedures: None    Medications:     stroke: mapping our early stages of recovery book   Does not apply Once   amLODipine  10 mg Oral Daily   aspirin EC  81 mg Oral BID   [START ON 11/16/2020] cloNIDine  0.3 mg Transdermal Q Sat   clopidogrel  75 mg Oral Daily   enoxaparin (LOVENOX) injection  40 mg Subcutaneous Q24H   ferrous sulfate  325 mg Oral Q breakfast   insulin aspart  0-15 Units Subcutaneous TID WC   insulin aspart  0-5 Units Subcutaneous QHS   isosorbide mononitrate  30 mg Oral QPM   levothyroxine  50 mcg Oral Q0600   lisinopril  40 mg Oral QHS   loratadine  10 mg Oral Daily   metoprolol succinate  25 mg Oral QPM   pravastatin  20 mg Oral QHS   rOPINIRole  1 mg Oral QHS   spironolactone  25 mg Oral QHS   vitamin B-12  1,000 mcg Oral Daily  Continuous Infusions:  sodium chloride Stopped (11/11/20 0234)     Anti-infectives (From admission, onward)    None              Family Communication/Anticipated D/C date and plan/Code Status   DVT prophylaxis: enoxaparin (LOVENOX) injection 40 mg Start: 11/09/20 2300     Code Status: Full Code  Family Communication: [name], son Disposition Plan: He will be discharged back to SNF when medically ready   Status is: Observation  The patient will require care spanning > 2 midnights and should be moved to inpatient because: Uncontrolled hypertension, altered mental status          Subjective:   He is still confused and unable to provide any  history.  Objective:    Vitals:   11/11/20 0620 11/11/20 0630 11/11/20 0718 11/11/20 0730  BP: (!) 200/107 (!) 217/96 (!) 215/88 (!) 169/79  Pulse: 83 83  89  Resp: 18 20  19   Temp:      TempSrc:      SpO2:    96%  Weight:      Height:       No data found.   Intake/Output Summary (Last 24 hours) at 11/11/2020 1042 Last data filed at 11/11/2020 0234 Gross per 24 hour  Intake 500 ml  Output 1400 ml  Net -900 ml   Filed Weights   11/09/20 1955  Weight: 81.6 kg    Exam:  GEN: NAD SKIN: Warm and dry EYES: No pallor or icterus ENT: MMM CV: RRR PULM: CTA B ABD: soft, ND, NT, +BS CNS: AAO x 1 (person), non focal EXT: No edema or tenderness        Data Reviewed:   I have personally reviewed following labs and imaging studies:  Labs: Labs show the following:   Basic Metabolic Panel: Recent Labs  Lab 11/08/20 1124 11/09/20 2003  NA 139 134*  K 4.5 5.0  CL 109 105  CO2 24 19*  GLUCOSE 120* 152*  BUN 20 16  CREATININE 0.75 0.80  CALCIUM 9.7 9.3   GFR Estimated Creatinine Clearance: 67.7 mL/min (by C-G formula based on SCr of 0.8 mg/dL). Liver Function Tests: Recent Labs  Lab 11/08/20 1124  AST 17  ALT 18  ALKPHOS 61  BILITOT 0.8  PROT 7.5  ALBUMIN 4.6   No results for input(s): LIPASE, AMYLASE in the last 168 hours. No results for input(s): AMMONIA in the last 168 hours.  Coagulation profile Recent Labs  Lab 11/08/20 1124 11/09/20 2003  INR 1.1 1.1    CBC: Recent Labs  Lab 11/08/20 1124 11/09/20 2003  WBC 5.9 5.1  6.5  NEUTROABS 3.4 3.5  HGB 12.5* 12.9*  12.9*  HCT 40.8 40.2  42.1  MCV 72.6* 71.3*  73.0*  PLT 287 247  253   Cardiac Enzymes: No results for input(s): CKTOTAL, CKMB, CKMBINDEX, TROPONINI in the last 168 hours. BNP (last 3 results) No results for input(s): PROBNP in the last 8760 hours. CBG: Recent Labs  Lab 11/10/20 0726 11/10/20 1058 11/10/20 1705 11/10/20 2122 11/11/20 0917  GLUCAP 128* 135*  137* 156* 194*   D-Dimer: No results for input(s): DDIMER in the last 72 hours. Hgb A1c: Recent Labs    11/10/20 0631  HGBA1C 6.5*   Lipid Profile: Recent Labs    11/10/20 0631  CHOL 82  HDL 29*  LDLCALC 30  TRIG 113  CHOLHDL 2.8   Thyroid function studies: No results for input(s): TSH, T4TOTAL, T3FREE,  THYROIDAB in the last 72 hours.  Invalid input(s): FREET3 Anemia work up: No results for input(s): VITAMINB12, FOLATE, FERRITIN, TIBC, IRON, RETICCTPCT in the last 72 hours. Sepsis Labs: Recent Labs  Lab 11/08/20 1124 11/09/20 2003  WBC 5.9 5.1  6.5    Microbiology Recent Results (from the past 240 hour(s))  Resp Panel by RT-PCR (Flu A&B, Covid) Nasopharyngeal Swab     Status: None   Collection Time: 11/03/20  6:55 PM   Specimen: Nasopharyngeal Swab; Nasopharyngeal(NP) swabs in vial transport medium  Result Value Ref Range Status   SARS Coronavirus 2 by RT PCR NEGATIVE NEGATIVE Final    Comment: (NOTE) SARS-CoV-2 target nucleic acids are NOT DETECTED.  The SARS-CoV-2 RNA is generally detectable in upper respiratory specimens during the acute phase of infection. The lowest concentration of SARS-CoV-2 viral copies this assay can detect is 138 copies/mL. A negative result does not preclude SARS-Cov-2 infection and should not be used as the sole basis for treatment or other patient management decisions. A negative result may occur with  improper specimen collection/handling, submission of specimen other than nasopharyngeal swab, presence of viral mutation(s) within the areas targeted by this assay, and inadequate number of viral copies(<138 copies/mL). A negative result must be combined with clinical observations, patient history, and epidemiological information. The expected result is Negative.  Fact Sheet for Patients:  EntrepreneurPulse.com.au  Fact Sheet for Healthcare Providers:  IncredibleEmployment.be  This test is  no t yet approved or cleared by the [place] FDA and  has been authorized for detection and/or diagnosis of SARS-CoV-2 by FDA under an Emergency Use Authorization (EUA). This EUA will remain  in effect (meaning this test can be used) for the duration of the COVID-19 declaration under Section 564(b)(1) of the Act, 21 U.S.C.section 360bbb-3(b)(1), unless the authorization is terminated  or revoked sooner.       Influenza A by PCR NEGATIVE NEGATIVE Final   Influenza B by PCR NEGATIVE NEGATIVE Final    Comment: (NOTE) The Xpert Xpress SARS-CoV-2/FLU/RSV plus assay is intended as an aid in the diagnosis of influenza from Nasopharyngeal swab specimens and should not be used as a sole basis for treatment. Nasal washings and aspirates are unacceptable for Xpert Xpress SARS-CoV-2/FLU/RSV testing.  Fact Sheet for Patients: EntrepreneurPulse.com.au  Fact Sheet for Healthcare Providers: IncredibleEmployment.be  This test is not yet approved or cleared by the [place] FDA and has been authorized for detection and/or diagnosis of SARS-CoV-2 by FDA under an Emergency Use Authorization (EUA). This EUA will remain in effect (meaning this test can be used) for the duration of the COVID-19 declaration under Section 564(b)(1) of the Act, 21 U.S.C. section 360bbb-3(b)(1), unless the authorization is terminated or revoked.  Performed at [facility], [address] [place], [place] [address]   Resp Panel by RT-PCR (Flu A&B, Covid) Nasopharyngeal Swab     Status: None   Collection Time: 11/08/20 11:24 AM   Specimen: Nasopharyngeal Swab; Nasopharyngeal(NP) swabs in vial transport medium  Result Value Ref Range Status   SARS Coronavirus 2 by RT PCR NEGATIVE NEGATIVE Final    Comment: (NOTE) SARS-CoV-2 target nucleic acids are NOT DETECTED.  The SARS-CoV-2 RNA is generally detectable in upper respiratory specimens during the acute phase of  infection. The lowest concentration of SARS-CoV-2 viral copies this assay can detect is 138 copies/mL. A negative result does not preclude SARS-Cov-2 infection and should not be used as the sole basis for treatment or other patient management decisions. A  negative result may occur with  improper specimen collection/handling, submission of specimen other than nasopharyngeal swab, presence of viral mutation(s) within the areas targeted by this assay, and inadequate number of viral copies(<138 copies/mL). A negative result must be combined with clinical observations, patient history, and epidemiological information. The expected result is Negative.  Fact Sheet for Patients:  EntrepreneurPulse.com.au  Fact Sheet for Healthcare Providers:  IncredibleEmployment.be  This test is no t yet approved or cleared by the [place] FDA and  has been authorized for detection and/or diagnosis of SARS-CoV-2 by FDA under an Emergency Use Authorization (EUA). This EUA will remain  in effect (meaning this test can be used) for the duration of the COVID-19 declaration under Section 564(b)(1) of the Act, 21 U.S.C.section 360bbb-3(b)(1), unless the authorization is terminated  or revoked sooner.       Influenza A by PCR NEGATIVE NEGATIVE Final   Influenza B by PCR NEGATIVE NEGATIVE Final    Comment: (NOTE) The Xpert Xpress SARS-CoV-2/FLU/RSV plus assay is intended as an aid in the diagnosis of influenza from Nasopharyngeal swab specimens and should not be used as a sole basis for treatment. Nasal washings and aspirates are unacceptable for Xpert Xpress SARS-CoV-2/FLU/RSV testing.  Fact Sheet for Patients: EntrepreneurPulse.com.au  Fact Sheet for Healthcare Providers: IncredibleEmployment.be  This test is not yet approved or cleared by the [place] FDA and has been authorized for detection and/or diagnosis of SARS-CoV-2  by FDA under an Emergency Use Authorization (EUA). This EUA will remain in effect (meaning this test can be used) for the duration of the COVID-19 declaration under Section 564(b)(1) of the Act, 21 U.S.C. section 360bbb-3(b)(1), unless the authorization is terminated or revoked.  Performed at [facility], [address] [place], [place] [address]   Resp Panel by RT-PCR (Flu A&B, Covid) Nasopharyngeal Swab     Status: None   Collection Time: 11/09/20  9:14 PM   Specimen: Nasopharyngeal Swab; Nasopharyngeal(NP) swabs in vial transport medium  Result Value Ref Range Status   SARS Coronavirus 2 by RT PCR NEGATIVE NEGATIVE Final    Comment: (NOTE) SARS-CoV-2 target nucleic acids are NOT DETECTED.  The SARS-CoV-2 RNA is generally detectable in upper respiratory specimens during the acute phase of infection. The lowest concentration of SARS-CoV-2 viral copies this assay can detect is 138 copies/mL. A negative result does not preclude SARS-Cov-2 infection and should not be used as the sole basis for treatment or other patient management decisions. A negative result may occur with  improper specimen collection/handling, submission of specimen other than nasopharyngeal swab, presence of viral mutation(s) within the areas targeted by this assay, and inadequate number of viral copies(<138 copies/mL). A negative result must be combined with clinical observations, patient history, and epidemiological information. The expected result is Negative.  Fact Sheet for Patients:  EntrepreneurPulse.com.au  Fact Sheet for Healthcare Providers:  IncredibleEmployment.be  This test is no t yet approved or cleared by the [place] FDA and  has been authorized for detection and/or diagnosis of SARS-CoV-2 by FDA under an Emergency Use Authorization (EUA). This EUA will remain  in effect (meaning this test can be used) for the duration of the COVID-19  declaration under Section 564(b)(1) of the Act, 21 U.S.C.section 360bbb-3(b)(1), unless the authorization is terminated  or revoked sooner.       Influenza A by PCR NEGATIVE NEGATIVE Final   Influenza B by PCR NEGATIVE NEGATIVE Final    Comment: (NOTE) The Xpert Xpress SARS-CoV-2/FLU/RSV plus assay is  intended as an aid in the diagnosis of influenza from Nasopharyngeal swab specimens and should not be used as a sole basis for treatment. Nasal washings and aspirates are unacceptable for Xpert Xpress SARS-CoV-2/FLU/RSV testing.  Fact Sheet for Patients: EntrepreneurPulse.com.au  Fact Sheet for Healthcare Providers: IncredibleEmployment.be  This test is not yet approved or cleared by the [place] FDA and has been authorized for detection and/or diagnosis of SARS-CoV-2 by FDA under an Emergency Use Authorization (EUA). This EUA will remain in effect (meaning this test can be used) for the duration of the COVID-19 declaration under Section 564(b)(1) of the Act, 21 U.S.C. section 360bbb-3(b)(1), unless the authorization is terminated or revoked.  Performed at [facility], [address] [place], [place] [address]     Procedures and diagnostic studies:  DG Chest 1 View  Result Date: 11/09/2020 CLINICAL DATA:  Hypertension EXAM: CHEST  1 VIEW COMPARISON:  11/03/2020 FINDINGS: Lungs volumes are small, but are symmetric and are clear. No pneumothorax or pleural effusion. Cardiac size within normal limits. Pulmonary vascularity is normal. Osseous structures are age-appropriate. No acute bone abnormality. IMPRESSION: Pulmonary hypoinflation Electronically Signed   By: Fidela Salisbury M.D.   On: 11/09/2020 20:48   CT HEAD WO CONTRAST  Result Date: 11/09/2020 CLINICAL DATA:  Weakness and vomiting. EXAM: CT HEAD WITHOUT CONTRAST TECHNIQUE: Contiguous axial images were obtained from the base of the skull through the vertex without  intravenous contrast. COMPARISON:  November 08, 2020 FINDINGS: Brain: There is mild cerebral atrophy with widening of the extra-axial spaces and ventricular dilatation. There are areas of decreased attenuation within the white matter tracts of the supratentorial brain, consistent with microvascular disease changes. Small, chronic bilateral basal ganglia lacunar infarcts are seen. Vascular: No hyperdense vessel or unexpected calcification. Skull: Normal. Negative for fracture or focal lesion. Sinuses/Orbits: No acute finding. Other: None. IMPRESSION: 1. Generalized cerebral atrophy. 2. Small, chronic bilateral basal ganglia lacunar infarcts. 3. No acute intracranial abnormality. Electronically Signed   By: Virgina Norfolk M.D.   On: 11/09/2020 20:44   MR BRAIN WO CONTRAST  Result Date: 11/09/2020 CLINICAL DATA:  Follow-up examination for acute stroke. EXAM: MRI HEAD WITHOUT CONTRAST TECHNIQUE: Multiplanar, multiecho pulse sequences of the brain and surrounding structures were obtained without intravenous contrast. COMPARISON:  Prior head CT from earlier the same day as well as recent MRI from 11/03/2020. FINDINGS: Brain: Diffuse prominence of the CSF containing spaces compatible generalized age-related cerebral atrophy. Patchy and confluent T2/FLAIR hyperintensity involving the periventricular and deep white matter both cerebral hemispheres most consistent with chronic small vessel ischemic disease, moderately advanced in nature. Few scattered remote lacunar infarcts present about the posterior limb of the left internal capsule, left thalamus, bilateral basal ganglia, and right pons. No abnormal foci of restricted diffusion to suggest acute or subacute ischemia. Gray-white matter differentiation otherwise maintained. No encephalomalacia to suggest chronic cortical infarction. No acute or chronic intracranial hemorrhage. No mass lesion, midline shift or mass effect. No hydrocephalus or extra-axial fluid  collection. Pituitary gland suprasellar region within normal limits. Midline structures intact. Vascular: Major intracranial vascular flow voids are maintained. Skull and upper cervical spine: Craniocervical junction within normal limits. Bone marrow signal intensity normal. No scalp soft tissue abnormality. Sinuses/Orbits: Globes and orbital soft tissues demonstrate no acute finding. Mild mucosal thickening noted within the ethmoidal air cells and maxillary sinuses. Paranasal sinuses are otherwise clear. Trace left mastoid effusion noted, of doubtful significance. Inner ear structures grossly normal. Other: None. IMPRESSION: 1. No acute  intracranial abnormality. 2. Moderately advanced cerebral atrophy with chronic microvascular ischemic disease, with a few scattered remote lacunar infarcts involving the posterior limb of the left internal capsule, left thalamus, bilateral basal ganglia, and right pons. Electronically Signed   By: Jeannine Boga M.D.   On: 11/09/2020 23:26   MR BRAIN W CONTRAST  Result Date: 11/10/2020 CLINICAL DATA:  Seizure with abnormal neuro exam EXAM: MRI HEAD WITH CONTRAST TECHNIQUE: Multiplanar, multiecho pulse sequences of the brain and surrounding structures were obtained with intravenous contrast. CONTRAST:  7.63mL GADAVIST GADOBUTROL 1 MMOL/ML IV SOLN COMPARISON:  Noncontrast brain MRI from yesterday FINDINGS: Brain: No abnormal enhancement. Parenchymal changes as described on full brain MRI yesterday. No interval finding. Vascular: Major vessels are enhancing. Skull and upper cervical spine: No abnormal enhancement Sinuses/Orbits: Negative IMPRESSION: No abnormal enhancement. Electronically Signed   By: Jorje Guild M.D.   On: 11/10/2020 11:02               LOS: 0 days   [place]  [facility]   Pager on [email or web address]. If 7PM-7AM, please contact night-coverage at [email or web address]     11/11/2020, 10:42 AM

## 2020-11-11 NOTE — Consult Note (Addendum)
NEUROLOGY CONSULTATION NOTE   Date of service: November 11, 2020 Patient Name: Steve Ramirez MRN:  [id number] DOB:  01-25-1935 Reason for consult: recurrent encephalopathy w/ transient neurologic sx Requesting physician: Dr. Jennye Boroughs _ _ _   _ __   _ __ _ _  __ __   _ __   __ _  History of Present Illness   Steve Ramirez is a 85 y.o. male with  a past medical history significant for coronary artery disease, hypertension, hyperlipidemia, diabetes, prior strokes with residual agraphia per chart review, prior confusional episodes, colonic diverticular bleed, melanoma.  Pt too encephalopathic to provide hx. Per current admission H&P, patient has hx "multiple CVAs and TIAs, recently hospitalized from 10/23-10/25 with TIA presenting with altered mental status with negative EEG and MRI showing only old lacunar infarcts and who is currently on dual antiplatelets therapy, who presents to the ED for the second time in 2 days with altered mental status.  On his first evaluation in the ED on 10/28, he presented with slurred speech that resolved by arrival.  He had a negative CT and was discharged back to his living facility.  He was apparently back to normal however on 10/27, at 5 PM he was seen to slump over in his chair and stopped talking reason for which she was again brought to the ED.  He otherwise showed no focalization of symptoms with no one-sided weakness or one-sided drooping of the face.  He had no seizure activity.  No other reports of any symptoms such as nausea, vomiting, chest pain or shortness of breath.  No abdominal pain or diarrhea."   He has now had 8+ spells of abnormal behavior involving AMS, speech impairment, and decreased responsiveness or agitation, and sometimes L facial droop. The fist episode occurred in 2016 and they are becoming increasingly frequent with 4 ED visits for same within past month. Family notes his episodes take anywhere from 30 minutes to 2 to 3 days  to resolve..  Interestingly some of these episodes have preceded strokes but other episodes have had entirely negative work-up.  Per report baseline he is functional and helps care for his wife in a senior living center in which they live together, and as of August 2022 at least he was still driving.  Recent neurology notes however describe suspicion of dementia and recommended o/p workup for this (has not been performed yet). I attempted to reach son to corroborate functional status and cognitive baseline but was unable to reach him today.  Recent workup:  MRI brain wo contrast 11/09/20 1. No acute intracranial abnormality. 2. Moderately advanced cerebral atrophy with chronic microvascular ischemic disease, with a few scattered remote lacunar infarcts involving the posterior limb of the left internal capsule, left thalamus, bilateral basal ganglia, and right pons.  MRI brain w contrast 11/10/20 No abnormal enhancement  MRA head 10/03/20 1. No acute intracranial hemorrhage or infarct. 2. Stable parenchymal volume loss and advanced chronic white matter microangiopathy. 3. Unchanged focal stenosis of the right P2 segment. Otherwise, patent intracranial vasculature.  MRA neck 08/15/20 No significant carotid stenosis. Possible ulceration a plaque in the right carotid bulb. Correlate with any symptoms of emboli. No significant vertebral artery stenosis. Right vertebral artery ends in PICA.  CNS personally reviewed; I agree with above interpretations  Carotid US 10/04/20 Color duplex indicates moderate heterogeneous and calcified plaque, with no hemodynamically significant stenosis by duplex criteria in the extracranial cerebrovascular circulation.  rEEG 11/04/20 interpreted by  me - mild DS awake and asleep, no sz, no interictals   ROS   UTA 2/2 encephalopathy  Past History   I have reviewed the following:  Past Medical History:  Diagnosis Date  . Coronary atherosclerosis of  native coronary artery   . Essential hypertension, benign   . GIB (gastrointestinal bleeding)    Colonic diverticular bleed  . History of melanoma   . History of TIAs   . Stroke ([place])   . Type 2 diabetes mellitus ([place])    Past Surgical History:  Procedure Laterality Date  . COLONOSCOPY  08/13/2010   Procedure: COLONOSCOPY;  Surgeon: Rogene Houston, MD;  Location: [facility];  Service: Endoscopy;  Laterality: N/A;  . JOINT REPLACEMENT    . KNEE SURGERY     arthroscopic  . LEFT HEART CATHETERIZATION WITH CORONARY ANGIOGRAM N/A 11/24/2010   Procedure: LEFT HEART CATHETERIZATION WITH CORONARY ANGIOGRAM;  Surgeon: Josue Hector, MD;  Location[facility] CATH LAB;  Service: Cardiovascular;  Laterality: N/A;  . neck fusion     years ago  . TOTAL HIP ARTHROPLASTY Right 08/02/2018   Procedure: TOTAL HIP ARTHROPLASTY ANTERIOR APPROACH;  Surgeon: Renette Butters, MD;  Location: [facility] ORS;  Service: Orthopedics;  Laterality: Right;  . TOTAL HIP ARTHROPLASTY Left 10/18/2018   Procedure: TOTAL HIP ARTHROPLASTY ANTERIOR APPROACH;  Surgeon: Renette Butters, MD;  Location: [facility] ORS;  Service: Orthopedics;  Laterality: Left;   Family History  Problem Relation Age of Onset  . Coronary artery disease Father   . Asthma Other   . Emphysema Mother   . Coronary artery disease Paternal Uncle   . Brain cancer Brother    Social History   Socioeconomic History  . Marital status: Married    Spouse name: Not on file  . Number of children: Not on file  . Years of education: Not on file  . Highest education level: Not on file  Occupational History  . Occupation: retired  Tobacco Use  . Smoking status: Former    Types: Cigarettes    Quit date: 01/13/1980    Years since quitting: 40.8  . Smokeless tobacco: Never  Vaping Use  . Vaping Use: Never used  Substance and Sexual Activity  . Alcohol use: Yes    Comment: occasionally  . Drug use: No  . Sexual activity: Not Currently    Partners: Female  Other  Topics Concern  . Not on file  Social History Narrative   Lives with wife. Grew up in [place] and after college moved to [place]. Got married. Moved to [place] and then to [place], [place].    Attends [employer].    Masters health education, administration   3 children   Caffeine- 1 pot coffee daily.   Daughter is a Education officer, museum. Son is a Higher education careers adviser. Son is in [employer].       Social Determinants of Health   Financial Resource Strain: Not on file  Food Insecurity: Not on file  Transportation Needs: Not on file  Physical Activity: Not on file  Stress: Not on file  Social Connections: Not on file   No Known Allergies  Medications   (Not in a hospital admission)     Current Facility-Administered Medications:  .   stroke: mapping our early stages of recovery book, , Does not apply, Once, Judd Gaudier V, MD .  0.9 %  sodium chloride infusion, , Intravenous, Continuous, Athena Masse, MD, Stopped at 11/11/20 0234 .  acetaminophen (TYLENOL) tablet 650 mg, 650 mg, Oral, Q4H PRN **OR** acetaminophen (TYLENOL) 160 MG/5ML solution 650 mg, 650 mg, Per Tube, Q4H PRN **OR** acetaminophen (TYLENOL) suppository 650 mg, 650 mg, Rectal, Q4H PRN, Judd Gaudier V, MD .  amLODipine (NORVASC) tablet 10 mg, 10 mg, Oral, Daily, Jennye Boroughs, MD .  aspirin EC tablet 81 mg, 81 mg, Oral, BID, Jennye Boroughs, MD, 81 mg at 11/10/20 2116 .  [START ON 11/16/2020] cloNIDine (CATAPRES - Dosed in mg/24 hr) patch 0.3 mg, 0.3 mg, Transdermal, Q Sat, Ayiku, Bernard, MD .  clopidogrel (PLAVIX) tablet 75 mg, 75 mg, Oral, Daily, Jennye Boroughs, MD, 75 mg at 11/10/20 1534 .  enoxaparin (LOVENOX) injection 40 mg, 40 mg, Subcutaneous, Q24H, Athena Masse, MD, 40 mg at 11/10/20 2116 .  ferrous sulfate tablet 325 mg, 325 mg, Oral, Q breakfast, Jennye Boroughs, MD .  insulin aspart (novoLOG) injection 0-15 Units, 0-15 Units, Subcutaneous, TID WC, Athena Masse, MD, 2 Units at 11/10/20 1738 .   insulin aspart (novoLOG) injection 0-5 Units, 0-5 Units, Subcutaneous, QHS, Damita Dunnings, Hazel V, MD .  isosorbide mononitrate (IMDUR) 24 hr tablet 30 mg, 30 mg, Oral, QPM, Jennye Boroughs, MD, 30 mg at 11/10/20 1841 .  labetalol (NORMODYNE) injection 10 mg, 10 mg, Intravenous, Q2H PRN, Jennye Boroughs, MD .  levothyroxine (SYNTHROID) tablet 50 mcg, 50 mcg, Oral, Q0600, Jennye Boroughs, MD, 50 mcg at 11/11/20 0717 .  lisinopril (ZESTRIL) tablet 40 mg, 40 mg, Oral, QHS, Jennye Boroughs, MD, 40 mg at 11/10/20 1533 .  loratadine (CLARITIN) tablet 10 mg, 10 mg, Oral, Daily, Jennye Boroughs, MD, 10 mg at 11/10/20 1542 .  metoprolol succinate (TOPROL-XL) 24 hr tablet 25 mg, 25 mg, Oral, QPM, Jennye Boroughs, MD, 25 mg at 11/10/20 1540 .  pravastatin (PRAVACHOL) tablet 20 mg, 20 mg, Oral, QHS, Jennye Boroughs, MD, 20 mg at 11/10/20 2123 .  rOPINIRole (REQUIP) tablet 1 mg, 1 mg, Oral, QHS, Jennye Boroughs, MD, 1 mg at 11/10/20 2123 .  senna-docusate (Senokot-S) tablet 1 tablet, 1 tablet, Oral, QHS PRN, Athena Masse, MD .  spironolactone (ALDACTONE) tablet 25 mg, 25 mg, Oral, QHS, Jennye Boroughs, MD, 25 mg at 11/10/20 2123 .  vitamin B-12 (CYANOCOBALAMIN) tablet 1,000 mcg, 1,000 mcg, Oral, Daily, Jennye Boroughs, MD  Current Outpatient Medications:  .  aspirin EC 81 MG tablet, Take 1 tablet (81 mg total) by mouth 2 (two) times daily. For DVT prophylaxis for 30 days after surgery., Disp: 60 tablet, Rfl: 0 .  cetirizine (ZYRTEC) 10 MG tablet, Take 10 mg by mouth daily., Disp: , Rfl:  .  cloNIDine (CATAPRES - DOSED IN MG/24 HR) 0.3 mg/24hr patch, Place 1 patch (0.3 mg total) onto the skin once a week. (Patient taking differently: Place 0.3 mg onto the skin every Saturday.), Disp: 4 patch, Rfl: 12 .  clopidogrel (PLAVIX) 75 MG tablet, Take 1 tablet (75 mg total) by mouth daily., Disp: 90 tablet, Rfl: 0 .  ferrous sulfate 325 (65 FE) MG tablet, Take 325 mg by mouth daily with breakfast., Disp: , Rfl:  .  isosorbide  mononitrate (IMDUR) 30 MG 24 hr tablet, Take 30 mg by mouth every evening. , Disp: , Rfl:  .  levothyroxine (SYNTHROID, LEVOTHROID) 50 MCG tablet, Take 1 tablet (50 mcg total) by mouth daily., Disp: 90 tablet, Rfl: 0 .  lisinopril (PRINIVIL,ZESTRIL) 40 MG tablet, Take 1 tablet (40 mg total) by mouth daily. (Patient taking differently: Take 40 mg by mouth at bedtime.), Disp:  90 tablet, Rfl: 3 .  metFORMIN (GLUCOPHAGE) 1000 MG tablet, Take 1,000 mg by mouth 2 (two) times daily., Disp: , Rfl:  .  metoprolol succinate (TOPROL-XL) 25 MG 24 hr tablet, Take 25 mg by mouth every evening. , Disp: , Rfl:  .  Multiple Vitamin (MULTIVITAMIN) tablet, Take 1 tablet by mouth daily.  , Disp: , Rfl:  .  nitroGLYCERIN (NITROSTAT) 0.4 MG SL tablet, Place 0.4 mg under the tongue every 5 (five) minutes as needed for chest pain., Disp: , Rfl:  .  Omega-3 Fatty Acids (FISH OIL) 1000 MG CAPS, Take 1,000 mg by mouth at bedtime., Disp: , Rfl:  .  pravastatin (PRAVACHOL) 20 MG tablet, Take 20 mg by mouth at bedtime., Disp: , Rfl:  .  rOPINIRole (REQUIP) 0.5 MG tablet, Take 2 tablets (1 mg total) by mouth at bedtime., Disp: 60 tablet, Rfl: 0 .  spironolactone (ALDACTONE) 25 MG tablet, Take 1 tablet (25 mg total) by mouth daily. (Patient taking differently: Take 25 mg by mouth at bedtime.), Disp: 90 tablet, Rfl: 0 .  vitamin B-12 (CYANOCOBALAMIN) 1000 MCG tablet, Take 1 tablet (1,000 mcg total) by mouth daily., Disp: 30 tablet, Rfl: 0  Vitals   Vitals:   11/11/20 0610 11/11/20 0620 11/11/20 0630 11/11/20 0718  BP: (!) 220/96 (!) 200/107 (!) 217/96 (!) 215/88  Pulse: 83 83 83   Resp: 16 18 20    Temp:      TempSrc:      SpO2:      Weight:      Height:         Body mass index is 27.37 kg/m.  Physical Exam   Physical Exam  Constitutional: Appears well-developed and well-nourished.  Psych: irritable, confused, not redirectable, resists examiner Cardiovascular: Normal rate and regular rhythm.  Respiratory: Effort  normal, non-labored breathing   Neuro: Mental Status: Asleep but arousable, oriented to self only, no other sensical speech, unable to follow simple commands but will occasionally mimic Speech: moderate dysarthria. Does not participate in naming/repetition testing Cranial Nerves: PERRL, blinks to threat bilat, EOMI, face symmetric, hearing somewhat impaired to voice Motor: unable to participate in formal strength testing but BUE appear at least 4-/5 bilat and BLE at least 3/5 bilat without apparent focality Sensory: SILT Reflexes: 2+ symmetric throughout, toes w/d only bilat Coordination, gait: UTA   Labs   CBC:  Recent Labs  Lab 11/08/20 1124 11/09/20 2003  WBC 5.9 5.1  6.5  NEUTROABS 3.4 3.5  HGB 12.5* 12.9*  12.9*  HCT 40.8 40.2  42.1  MCV 72.6* 71.3*  73.0*  PLT 287 247  400    Basic Metabolic Panel:  Lab Results  Component Value Date   NA 134 (L) 11/09/2020   K 5.0 11/09/2020   CO2 19 (L) 11/09/2020   GLUCOSE 152 (H) 11/09/2020   BUN 16 11/09/2020   CREATININE 0.80 11/09/2020   CALCIUM 9.3 11/09/2020   GFRNONAA >60 11/09/2020   GFRAA >60 10/10/2018   Lipid Panel:  Lab Results  Component Value Date   LDLCALC 30 11/10/2020   HgbA1c:  Lab Results  Component Value Date   HGBA1C 6.5 (H) 11/10/2020   Urine Drug Screen:     Component Value Date/Time   LABOPIA NONE DETECTED 11/04/2020 1946   LABOPIA NONE DETECTED 08/15/2020 [place] DETECTED 11/04/2020 1946   LABBENZ NONE DETECTED 11/04/2020 1946   LABBENZ NONE DETECTED 08/15/2020 [place] DETECTED 11/04/2020 1946  AMPHETMU NONE DETECTED 08/15/2020 1526   THCU NONE DETECTED 11/04/2020 1946   THCU NONE DETECTED 08/15/2020 [place] DETECTED 11/04/2020 1946   LABBARB NONE DETECTED 08/15/2020 1526    Alcohol Level     Component Value Date/Time   ETH <10 11/08/2020 1124     Impression   85 yo man w/ pmhx significant for CAD, HTN, HL, DM2, multiple prior  strokes with frequent recurrent episodes of altered mental status starting in 2016. He has presented for same 4x in past 30 days and has had extensive workup which has been unrevealing. Ddx incl TIA, seizure, hypertensive urgency, spells related to dementia (LBD or other - unclear baseline but recent neuro providers have queried dementia, has not yet been worked up for this in o/p setting). Stroke workup recently completed and MRI brain this admission negative for acute infarct.  Recommendations   - Continue ASA 81mg  daily + pravastatin for secondary stroke prevention and CAD - Mgmt hypertensive urgency per primary team - rEEG tmrw - contact son [name] to clarify baseline mental status - suspect these events may be related to dementia (LBD or other)  Will continue to follow ______________________________________________________________________   Thank you for the opportunity to take part in the care of this patient. If you have any further questions, please contact the neurology consultation attending.  Signed,  Su Monks, MD [facility] [phone]  If 7pm- 7am, please page neurology on call as listed in [place].

## 2020-11-11 NOTE — ED Notes (Addendum)
RN to bedside. Pt extremely hypertensive. Restless in bed. Replaced pulse ox. MSG MD about HTN for possible IV medications.  IV pump was not connected to pt.

## 2020-11-11 NOTE — Progress Notes (Signed)
EEG Findings: Periodic discharges with triphasic morphology, generalized (GPDs) on a background of continuous generalized slowing.  EEG report Impression: This study showed periodic discharges with triphasic morphology which can be on the ictal-interictal continuum. However, the morphology, frequency and reactivity to stimulation is more likely indicative of toxic-metabolic causes. There is also moderate diffuse encephalopathy, nonspecific etiology. No seizures or definite epileptiform discharges were seen throughout the recording.  Assessment: 85 year old male re-presenting with acute confusional state, in the context of hypertensive urgency. He has now had 8+ spells of abnormal behavior involving AMS, speech impairment, and decreased responsiveness or agitation, and sometimes L facial droop. The fist episode occurred in 2016 and they are becoming increasingly frequent with 4 ED visits for same within past month.  Family notes his episodes take anywhere from 30 minutes to 2 to 3 days to resolve.. Interestingly, some of these episodes have preceded strokes but other episodes have had entirely negative work-up. Outside of the cognitive fluctuations, per report at baseline he is functional and helps care for his wife in a senior living center in which they live together, and as of August 2022 at least he was still driving. - Overall EEG and clinical findings are most consistent with a toxic-metabolic encephalopathy. Neurological reserve likely reduced due to multiple CVAs in the past. He does have residual agraphia per chart review, as well as prior confusional episodes.  - MRI brain w/wo does not reveal any acute abnormality.  - Of note, his son stated that the patient has noticed gradual change in mental status/confusion about 6 weeks prior to admission - Recent neuro providers have queried dementia, but he has not yet been worked up for this in o/p setting  Recommendations: - Will need outpatient  Neurology follow up and possible dementia work up. DDx includes Lewy body dementia overlapping multiinfarct dementia given prior strokes as well as his multiple episodes of recurrent AMS (Lewy body dementia is characterized by cognitive fluctuations) - Trial of Aricept 5 mg po qhs.  - Continue DAPT - If AMS still not cleared by Wednesday, obtain repeat EEG.   Electronically signed: Dr. Kerney Elbe

## 2020-11-11 NOTE — Progress Notes (Signed)
SLP Cancellation Note  Patient Details Name: Steve Ramirez MRN: [id number] DOB: [birth year]   Cancelled treatment:       Reason Eval/Treat Not Completed: Spoke with RN. Pt not appropriate for cognitive-linguistic evaluation given AMS. Pt known to SLP services from recent hospitalization with recommendation for post-acute SLP services at SNF. Should pt d/c prior to completion of SLP evaluation, pt OK to resume SLP services at next level of care.   SLP to monitor for improvement in mental status and will f/u in ~2 days.  RN aware and in agreement with the above.    [name] 11/11/2020, 11:09 AM

## 2020-11-11 NOTE — ED Notes (Signed)
Informed RN bed assigned again 

## 2020-11-11 NOTE — Progress Notes (Signed)
Eeg done 

## 2020-11-11 NOTE — Procedures (Signed)
Patient Name: MILOH ALCOCER  MRN: [id number]  Epilepsy Attending: Lora Havens  Referring Physician/Provider: Dr. Su Monks Date: 11/11/2020 Duration: 20.36 mins  Patient history: 85 year old male with altered mental status.  EEG to evaluate for seizure.  Level of alertness: Awake, asleep  AEDs during EEG study: None  Technical aspects: This EEG study was done with scalp electrodes positioned according to the 10-20 International system of electrode placement. Electrical activity was acquired at a sampling rate of 500Hz  and reviewed with a high frequency filter of 70Hz  and a low frequency filter of 1Hz . EEG data were recorded continuously and digitally stored.   Description: No clear posterior dominant rhythm was seen. Sleep was characterized by vertex waves, sleep spindles (12 to 14 Hz), maximal frontocentral region. EEG showed continuous generalized 3 to 6 Hz theta-delta slowing. Generalized periodic discharges with triphasic morphology at 1Hz  were also noted, more prominent when awake/stimulated. Hyperventilation and photic stimulation were not performed.     ABNORMALITY - Periodic discharges with triphasic morphology, generalized ( GPDs) - Continuous slow, generalized  IMPRESSION: This study showed periodic discharges with triphasic morphology which can be on the ictal-interictal continuum. However, the morphology, frequency and reactivity to stimulation is more likely indicative of toxic-metabolic causes. There is also moderate diffuse encephalopathy, nonspecific etiology. No seizures or definite epileptiform discharges were seen throughout the recording.  Zameria Vogl Barbra Sarks

## 2020-11-11 NOTE — ED Notes (Signed)
RN to bedside. Pt soacked in urine. Prima fit had come off. Pt placed in clean brief and linens changed.

## 2020-11-11 NOTE — ED Notes (Signed)
Informed RN bed assigned 

## 2020-11-12 DIAGNOSIS — L8990 Pressure ulcer of unspecified site, unspecified stage: Secondary | ICD-10-CM | POA: Insufficient documentation

## 2020-11-12 DIAGNOSIS — G9341 Metabolic encephalopathy: Secondary | ICD-10-CM | POA: Diagnosis not present

## 2020-11-12 DIAGNOSIS — I10 Essential (primary) hypertension: Secondary | ICD-10-CM

## 2020-11-12 LAB — BASIC METABOLIC PANEL
Anion gap: 10 (ref 5–15)
BUN: 18 mg/dL (ref 8–23)
CO2: 19 mmol/L — ABNORMAL LOW (ref 22–32)
Calcium: 8.8 mg/dL — ABNORMAL LOW (ref 8.9–10.3)
Chloride: 107 mmol/L (ref 98–111)
Creatinine, Ser: 0.73 mg/dL (ref 0.61–1.24)
GFR, Estimated: >60 mL/min (ref >60)
Glucose, Bld: 141 mg/dL — ABNORMAL HIGH (ref 70–99)
Potassium: 3.6 mmol/L (ref 3.5–5.1)
Sodium: 136 mmol/L (ref 135–145)

## 2020-11-12 LAB — CBC WITH DIFFERENTIAL/PLATELET
Abs Immature Granulocytes: 0.01 10*3/uL (ref 0.00–0.07)
Basophils Absolute: 0 10*3/uL (ref 0.0–0.1)
Basophils Relative: 1 %
Eosinophils Absolute: 0 10*3/uL (ref 0.0–0.5)
Eosinophils Relative: 0 %
HCT: 42 % (ref 39.0–52.0)
Hemoglobin: 13.3 g/dL (ref 13.0–17.0)
Immature Granulocytes: 0 %
Lymphocytes Relative: 14 %
Lymphs Abs: 0.7 10*3/uL (ref 0.7–4.0)
MCH: 22.3 pg — ABNORMAL LOW (ref 26.0–34.0)
MCHC: 31.7 g/dL (ref 30.0–36.0)
MCV: 70.5 fL — ABNORMAL LOW (ref 80.0–100.0)
Monocytes Absolute: 0.6 10*3/uL (ref 0.1–1.0)
Monocytes Relative: 14 %
Neutro Abs: 3.3 10*3/uL (ref 1.7–7.7)
Neutrophils Relative %: 71 %
Platelets: 217 10*3/uL (ref 150–400)
RBC: 5.96 MIL/uL — ABNORMAL HIGH (ref 4.22–5.81)
RDW: 20.6 % — ABNORMAL HIGH (ref 11.5–15.5)
WBC: 4.6 10*3/uL (ref 4.0–10.5)
nRBC: 0 % (ref 0.0–0.2)

## 2020-11-12 LAB — GLUCOSE, CAPILLARY
Glucose-Capillary: 139 mg/dL — ABNORMAL HIGH (ref 70–99)
Glucose-Capillary: 155 mg/dL — ABNORMAL HIGH (ref 70–99)
Glucose-Capillary: 154 mg/dL — ABNORMAL HIGH (ref 70–99)
Glucose-Capillary: 149 mg/dL — ABNORMAL HIGH (ref 70–99)

## 2020-11-12 LAB — MRSA NEXT GEN BY PCR, NASAL: MRSA by PCR Next Gen: NOT DETECTED

## 2020-11-12 LAB — AMMONIA: Ammonia: 19 umol/L (ref 9–35)

## 2020-11-12 NOTE — Plan of Care (Signed)

## 2020-11-12 NOTE — Progress Notes (Addendum)
Progress Note    Steve Ramirez  LZJ:[id number] DOB: 1935/04/11  DOA: 11/09/2020 PCP: Sharilyn Sites, MD      Brief Narrative:    Medical records reviewed and are as summarized below:  Steve Ramirez is a 85 y.o. male with medical history significant for multiple strokes, recently discharged from the hospital on 11/05/2020 for TIA, hypertension, GI bleed, type 2 diabetes mellitus, hyperlipidemia, history of melanoma, who was brought to the hospital because of altered mental status.  He initially presented to the emergency room on 11/08/2020 with slurred speech.  CT head did not show any acute abnormality and was discharged back to the assisted living facility.  On 11/09/2020 around 5 PM, he was found slumped in his chair and was brought back to the hospital.  He was admitted to the hospital for acute confusional state.  Etiology of change in mental status is not clear.  MRI brain with and without contrast did not show any acute abnormality.  He also had hypertensive urgency which was treated with  antihypertensives.    Assessment/Plan:   Principal Problem:   Acute metabolic encephalopathy Active Problems:   Diabetes mellitus without complication (HCC)   History of TIAs   Essential hypertension   Hypothyroidism   CAD (coronary artery disease)   Hypertensive urgency   AMS (altered mental status)   Pressure injury of skin    Body mass index is 27.37 kg/m.  Acute confusional state, probable underlying dementia: Improving.  Etiology is unclear.  MRI brain with and without contrast did not show any acute abnormality.  His son, [name], said he has noticed gradual change in mental status/confusion about 6 weeks prior to admission. Neurologist recommended Aricept nightly and outpatient work-up for dementia.  He also recommended repeat EEG if mental status does not improve by Wednesday, 11/13/2020. However, mental status seems to be improving and he may not need repeat  EEG.  Discontinue IV fluids.  Consult speech therapist for swallow evaluation.  CAD, history of multiple TIAs and stroke: Continue aspirin, Plavix and pravastatin as able.    Hypertensive urgency: BP is better.  Continue antihypertensives.  Chronic diastolic CHF: Compensated.  Continue antihypertensives.  2D echo on 10/24/2020 showed normal EF and grade 2 diastolic dysfunction.  Type II DM: Hemoglobin A1c 6.5.  NovoLog as needed for hyperglycemia.  Possible discharge to SNF tomorrow if mental status continues to improve.  Diet Order             Diet heart healthy/carb modified Room service appropriate? Yes; Fluid consistency: Thin  Diet effective now                      Consultants: Neurologist  Procedures: None    Medications:     stroke: mapping our early stages of recovery book   Does not apply Once   amLODipine  10 mg Oral Daily   aspirin EC  81 mg Oral BID   cloNIDine  0.3 mg Transdermal Q Mon   clopidogrel  75 mg Oral Daily   enoxaparin (LOVENOX) injection  40 mg Subcutaneous Q24H   ferrous sulfate  325 mg Oral Q breakfast   insulin aspart  0-15 Units Subcutaneous TID WC   insulin aspart  0-5 Units Subcutaneous QHS   isosorbide mononitrate  30 mg Oral QPM   levothyroxine  50 mcg Oral Q0600   lisinopril  40 mg Oral QHS   loratadine  10 mg Oral Daily  metoprolol succinate  25 mg Oral QPM   pravastatin  20 mg Oral QHS   rOPINIRole  1 mg Oral QHS   spironolactone  25 mg Oral QHS   vitamin B-12  1,000 mcg Oral Daily   Continuous Infusions:     Anti-infectives (From admission, onward)    None              Family Communication/Anticipated D/C date and plan/Code Status   DVT prophylaxis: enoxaparin (LOVENOX) injection 40 mg Start: 11/09/20 2300     Code Status: Full Code  Family Communication: [name], daughter-in-law, at the bedside Disposition Plan: He will be discharged back to SNF when medically ready  Status is:  Inpatient  Remains inpatient appropriate because: Acute confusion                Subjective:   Interval events noted.  Patient feels better today.  Mental status is better.  His nurse was concerned that he is pocketing his pills in his mouth.  Objective:    Vitals:   11/11/20 1945 11/12/20 0352 11/12/20 0739 11/12/20 1134  BP: (!) 147/91 (!) 169/81 (!) 162/79 (!) 159/80  Pulse: 97 77 81 74  Resp: 16 18 18 19   Temp: 98.1 F (36.7 C) 97.8 F (36.6 C) 97.8 F (36.6 C) 97.8 F (36.6 C)  TempSrc:      SpO2: 94% 96% 96% 97%  Weight:      Height:       No data found.   Intake/Output Summary (Last 24 hours) at 11/12/2020 1402 Last data filed at 11/12/2020 1330 Gross per 24 hour  Intake 240 ml  Output 150 ml  Net 90 ml   Filed Weights   11/09/20 1955  Weight: 81.6 kg    Exam:  GEN: NAD SKIN: No rash EYES: EOMI ENT: MMM CV: RRR PULM: CTA B ABD: soft, ND, NT, +BS CNS: AAO x 3, non focal EXT: No edema or tenderness     Pressure Injury 11/11/20 Buttocks Mid Stage 1 -  Intact skin with non-blanchable redness of a localized area usually over a bony prominence. (Active)  11/11/20 1828  Location: Buttocks  Location Orientation: Mid  Staging: Stage 1 -  Intact skin with non-blanchable redness of a localized area usually over a bony prominence.  Wound Description (Comments):   Present on Admission: Yes     Data Reviewed:   I have personally reviewed following labs and imaging studies:  Labs: Labs show the following:   Basic Metabolic Panel: Recent Labs  Lab 11/08/20 1124 11/09/20 2003 11/12/20 0618  NA 139 134* 136  K 4.5 5.0 3.6  CL 109 105 107  CO2 24 19* 19*  GLUCOSE 120* 152* 141*  BUN 20 16 18   CREATININE 0.75 0.80 0.73  CALCIUM 9.7 9.3 8.8*   GFR Estimated Creatinine Clearance: 67.7 mL/min (by C-G formula based on SCr of 0.73 mg/dL). Liver Function Tests: Recent Labs  Lab 11/08/20 1124  AST 17  ALT 18  ALKPHOS 61  BILITOT  0.8  PROT 7.5  ALBUMIN 4.6   No results for input(s): LIPASE, AMYLASE in the last 168 hours. Recent Labs  Lab 11/12/20 0618  AMMONIA 19    Coagulation profile Recent Labs  Lab 11/08/20 1124 11/09/20 2003  INR 1.1 1.1    CBC: Recent Labs  Lab 11/08/20 1124 11/09/20 2003 11/12/20 0618  WBC 5.9 5.1  6.5 4.6  NEUTROABS 3.4 3.5 3.3  HGB 12.5* 12.9*  12.9* 13.3  HCT 40.8 40.2  42.1 42.0  MCV 72.6* 71.3*  73.0* 70.5*  PLT 287 247  253 217   Cardiac Enzymes: No results for input(s): CKTOTAL, CKMB, CKMBINDEX, TROPONINI in the last 168 hours. BNP (last 3 results) No results for input(s): PROBNP in the last 8760 hours. CBG: Recent Labs  Lab 11/11/20 1701 11/11/20 2025 11/11/20 2204 11/12/20 0746 11/12/20 1120  GLUCAP 177* 150* 128* 139* 155*   D-Dimer: No results for input(s): DDIMER in the last 72 hours. Hgb A1c: Recent Labs    11/10/20 0631  HGBA1C 6.5*   Lipid Profile: Recent Labs    11/10/20 0631  CHOL 82  HDL 29*  LDLCALC 30  TRIG 113  CHOLHDL 2.8   Thyroid function studies: No results for input(s): TSH, T4TOTAL, T3FREE, THYROIDAB in the last 72 hours.  Invalid input(s): FREET3 Anemia work up: No results for input(s): VITAMINB12, FOLATE, FERRITIN, TIBC, IRON, RETICCTPCT in the last 72 hours. Sepsis Labs: Recent Labs  Lab 11/08/20 1124 11/09/20 2003 11/12/20 0618  WBC 5.9 5.1  6.5 4.6    Microbiology Recent Results (from the past 240 hour(s))  Resp Panel by RT-PCR (Flu A&B, Covid) Nasopharyngeal Swab     Status: None   Collection Time: 11/03/20  6:55 PM   Specimen: Nasopharyngeal Swab; Nasopharyngeal(NP) swabs in vial transport medium  Result Value Ref Range Status   SARS Coronavirus 2 by RT PCR NEGATIVE NEGATIVE Final    Comment: (NOTE) SARS-CoV-2 target nucleic acids are NOT DETECTED.  The SARS-CoV-2 RNA is generally detectable in upper respiratory specimens during the acute phase of infection. The lowest concentration of  SARS-CoV-2 viral copies this assay can detect is 138 copies/mL. A negative result does not preclude SARS-Cov-2 infection and should not be used as the sole basis for treatment or other patient management decisions. A negative result may occur with  improper specimen collection/handling, submission of specimen other than nasopharyngeal swab, presence of viral mutation(s) within the areas targeted by this assay, and inadequate number of viral copies(<138 copies/mL). A negative result must be combined with clinical observations, patient history, and epidemiological information. The expected result is Negative.  Fact Sheet for Patients:  EntrepreneurPulse.com.au  Fact Sheet for Healthcare Providers:  IncredibleEmployment.be  This test is no t yet approved or cleared by the [place] FDA and  has been authorized for detection and/or diagnosis of SARS-CoV-2 by FDA under an Emergency Use Authorization (EUA). This EUA will remain  in effect (meaning this test can be used) for the duration of the COVID-19 declaration under Section 564(b)(1) of the Act, 21 U.S.C.section 360bbb-3(b)(1), unless the authorization is terminated  or revoked sooner.       Influenza A by PCR NEGATIVE NEGATIVE Final   Influenza B by PCR NEGATIVE NEGATIVE Final    Comment: (NOTE) The Xpert Xpress SARS-CoV-2/FLU/RSV plus assay is intended as an aid in the diagnosis of influenza from Nasopharyngeal swab specimens and should not be used as a sole basis for treatment. Nasal washings and aspirates are unacceptable for Xpert Xpress SARS-CoV-2/FLU/RSV testing.  Fact Sheet for Patients: EntrepreneurPulse.com.au  Fact Sheet for Healthcare Providers: IncredibleEmployment.be  This test is not yet approved or cleared by the [place] FDA and has been authorized for detection and/or diagnosis of SARS-CoV-2 by FDA under an Emergency Use  Authorization (EUA). This EUA will remain in effect (meaning this test can be used) for the duration of the COVID-19 declaration under Section 564(b)(1) of the Act, 21 U.S.C. section 360bbb-3(b)(1), unless  the authorization is terminated or revoked.  Performed at [facility], [address] [place], [place] [address]   Resp Panel by RT-PCR (Flu A&B, Covid) Nasopharyngeal Swab     Status: None   Collection Time: 11/08/20 11:24 AM   Specimen: Nasopharyngeal Swab; Nasopharyngeal(NP) swabs in vial transport medium  Result Value Ref Range Status   SARS Coronavirus 2 by RT PCR NEGATIVE NEGATIVE Final    Comment: (NOTE) SARS-CoV-2 target nucleic acids are NOT DETECTED.  The SARS-CoV-2 RNA is generally detectable in upper respiratory specimens during the acute phase of infection. The lowest concentration of SARS-CoV-2 viral copies this assay can detect is 138 copies/mL. A negative result does not preclude SARS-Cov-2 infection and should not be used as the sole basis for treatment or other patient management decisions. A negative result may occur with  improper specimen collection/handling, submission of specimen other than nasopharyngeal swab, presence of viral mutation(s) within the areas targeted by this assay, and inadequate number of viral copies(<138 copies/mL). A negative result must be combined with clinical observations, patient history, and epidemiological information. The expected result is Negative.  Fact Sheet for Patients:  EntrepreneurPulse.com.au  Fact Sheet for Healthcare Providers:  IncredibleEmployment.be  This test is no t yet approved or cleared by the [place] FDA and  has been authorized for detection and/or diagnosis of SARS-CoV-2 by FDA under an Emergency Use Authorization (EUA). This EUA will remain  in effect (meaning this test can be used) for the duration of the COVID-19 declaration under Section 564(b)(1)  of the Act, 21 U.S.C.section 360bbb-3(b)(1), unless the authorization is terminated  or revoked sooner.       Influenza A by PCR NEGATIVE NEGATIVE Final   Influenza B by PCR NEGATIVE NEGATIVE Final    Comment: (NOTE) The Xpert Xpress SARS-CoV-2/FLU/RSV plus assay is intended as an aid in the diagnosis of influenza from Nasopharyngeal swab specimens and should not be used as a sole basis for treatment. Nasal washings and aspirates are unacceptable for Xpert Xpress SARS-CoV-2/FLU/RSV testing.  Fact Sheet for Patients: EntrepreneurPulse.com.au  Fact Sheet for Healthcare Providers: IncredibleEmployment.be  This test is not yet approved or cleared by the [place] FDA and has been authorized for detection and/or diagnosis of SARS-CoV-2 by FDA under an Emergency Use Authorization (EUA). This EUA will remain in effect (meaning this test can be used) for the duration of the COVID-19 declaration under Section 564(b)(1) of the Act, 21 U.S.C. section 360bbb-3(b)(1), unless the authorization is terminated or revoked.  Performed at [facility], [address] [place], [place] [address]   Resp Panel by RT-PCR (Flu A&B, Covid) Nasopharyngeal Swab     Status: None   Collection Time: 11/09/20  9:14 PM   Specimen: Nasopharyngeal Swab; Nasopharyngeal(NP) swabs in vial transport medium  Result Value Ref Range Status   SARS Coronavirus 2 by RT PCR NEGATIVE NEGATIVE Final    Comment: (NOTE) SARS-CoV-2 target nucleic acids are NOT DETECTED.  The SARS-CoV-2 RNA is generally detectable in upper respiratory specimens during the acute phase of infection. The lowest concentration of SARS-CoV-2 viral copies this assay can detect is 138 copies/mL. A negative result does not preclude SARS-Cov-2 infection and should not be used as the sole basis for treatment or other patient management decisions. A negative result may occur with  improper specimen  collection/handling, submission of specimen other than nasopharyngeal swab, presence of viral mutation(s) within the areas targeted by this assay, and inadequate number of viral copies(<138 copies/mL). A  negative result must be combined with clinical observations, patient history, and epidemiological information. The expected result is Negative.  Fact Sheet for Patients:  EntrepreneurPulse.com.au  Fact Sheet for Healthcare Providers:  IncredibleEmployment.be  This test is no t yet approved or cleared by the [place] FDA and  has been authorized for detection and/or diagnosis of SARS-CoV-2 by FDA under an Emergency Use Authorization (EUA). This EUA will remain  in effect (meaning this test can be used) for the duration of the COVID-19 declaration under Section 564(b)(1) of the Act, 21 U.S.C.section 360bbb-3(b)(1), unless the authorization is terminated  or revoked sooner.       Influenza A by PCR NEGATIVE NEGATIVE Final   Influenza B by PCR NEGATIVE NEGATIVE Final    Comment: (NOTE) The Xpert Xpress SARS-CoV-2/FLU/RSV plus assay is intended as an aid in the diagnosis of influenza from Nasopharyngeal swab specimens and should not be used as a sole basis for treatment. Nasal washings and aspirates are unacceptable for Xpert Xpress SARS-CoV-2/FLU/RSV testing.  Fact Sheet for Patients: EntrepreneurPulse.com.au  Fact Sheet for Healthcare Providers: IncredibleEmployment.be  This test is not yet approved or cleared by the [place] FDA and has been authorized for detection and/or diagnosis of SARS-CoV-2 by FDA under an Emergency Use Authorization (EUA). This EUA will remain in effect (meaning this test can be used) for the duration of the COVID-19 declaration under Section 564(b)(1) of the Act, 21 U.S.C. section 360bbb-3(b)(1), unless the authorization is terminated or revoked.  Performed at [facility], [address] [place], [place] [address]     Procedures and diagnostic studies:  EEG adult  Result Date: 11/11/2020 Lora Havens, MD     11/11/2020  5:33 PM Patient Name: ELTON HEID MRN: [id number] Epilepsy Attending: Lora Havens Referring Physician/Provider: Dr. Su Monks Date: 11/11/2020 Duration: 20.36 mins Patient history: 85 year old male with altered mental status.  EEG to evaluate for seizure. Level of alertness: Awake, asleep AEDs during EEG study: None Technical aspects: This EEG study was done with scalp electrodes positioned according to the 10-20 International system of electrode placement. Electrical activity was acquired at a sampling rate of 500Hz  and reviewed with a high frequency filter of 70Hz  and a low frequency filter of 1Hz . EEG data were recorded continuously and digitally stored. Description: No clear posterior dominant rhythm was seen. Sleep was characterized by vertex waves, sleep spindles (12 to 14 Hz), maximal frontocentral region. EEG showed continuous generalized 3 to 6 Hz theta-delta slowing. Generalized periodic discharges with triphasic morphology at 1Hz  were also noted, more prominent when awake/stimulated. Hyperventilation and photic stimulation were not performed.   ABNORMALITY - Periodic discharges with triphasic morphology, generalized ( GPDs) - Continuous slow, generalized IMPRESSION: This study showed periodic discharges with triphasic morphology which can be on the ictal-interictal continuum. However, the morphology, frequency and reactivity to stimulation is more likely indicative of toxic-metabolic causes. There is also moderate diffuse encephalopathy, nonspecific etiology. No seizures or definite epileptiform discharges were seen throughout the recording. Priyanka Barbra Sarks               LOS: 1 day   Oona Trammel  [facility]   Pager on [email or web address]. If 7PM-7AM, please contact night-coverage at  [email or web address]     11/12/2020, 2:02 PM

## 2020-11-12 NOTE — Progress Notes (Signed)
Occupational Therapy Treatment Patient Details Name: Steve Ramirez MRN: [id number] DOB: 09/23/1935 Today's Date: 11/12/2020   History of present illness Pt is an 85 y/o M admitted on 11/09/20 after presenting with c/o AMS. Pt was recently hospitalized from 10/23-10/25 with TIA presenting with AMS with negative EEG & MRI. Pt returned to ED on 10/28 with c/o slurred speech that resolved upon arrival; head CT was negative & he was sent back to ALF. Pt was later found slumped over in his chair & stopped talking so was brought back to ED. He showed no focalization of symptoms & no seizure activity. MRI was negative for acute changes. PMH: HTN, DM, multiple CVAs & TIAs, melanoma   OT comments  Upon entering the room, pt supine in bed and needing multiple attempts to arouse pt to therapeutic intervention. Use of warm cloth to alert pt and he was able to wash face and hands with cuing and increased time to sequence and initiate. Pt is oriented to self and location only. Pt is agreeable to therapy this session with max A needed to get to EOB. Pt needing min - mod A for static sitting balance x 5 minutes. Pt having difficulty remaining awake during session. Pt standing with max A and significant posterior bias this session. Pt unable to take steps safely and returns to bed. Mod A for sit >supine and OT adjusts pt in bed for comfort and safety. Pt quickly returning back to sleep. Bed alarm activated and all needs within reach. Pt continues to benefit from OT intervention.    Recommendations for follow up therapy are one component of a multi-disciplinary discharge planning process, led by the attending physician.  Recommendations may be updated based on patient status, additional functional criteria and insurance authorization.    Follow Up Recommendations  Skilled nursing-short term rehab (<3 hours/day)    Assistance Recommended at Discharge Frequent or constant Supervision/Assistance  Equipment  Recommendations  Other (comment) (defer to next venue of care)       Precautions / Restrictions Precautions Precautions: Fall Restrictions Weight Bearing Restrictions: No       Mobility Bed Mobility Overal bed mobility: Needs Assistance Bed Mobility: Sit to Supine;Supine to Sit     Supine to sit: Max assist Sit to supine: Mod assist   General bed mobility comments: assist with BLEs    Transfers Overall transfer level: Needs assistance Equipment used: 1 person hand held assist Transfers: Sit to/from Stand Sit to Stand: Max assist           General transfer comment: heavy posterior bias in standing. Unable to safely step     Balance Overall balance assessment: Needs assistance   Sitting balance-Leahy Scale: Poor Sitting balance - Comments: min-mod A Postural control: Posterior lean   Standing balance-Leahy Scale: Zero Standing balance comment: heavy assist                           ADL either performed or assessed with clinical judgement   ADL Overall ADL's : Needs assistance/impaired     Grooming: Wash/dry hands;Wash/dry face;Set up;Supervision/safety                                       Vision Patient Visual Report: No change from baseline            Cognition Arousal/Alertness: Lethargic Behavior During Therapy:  Flat affect Overall Cognitive Status: History of cognitive impairments - at baseline                                 General Comments: Oriented to self and location. Follows commands with mod cuing and increased time.                     Pertinent Vitals/ Pain       Pain Assessment: Faces Faces Pain Scale: No hurt         Frequency  Min 1X/week        Progress Toward Goals  OT Goals(current goals can now be found in the care plan section)  Progress towards OT goals: Progressing toward goals  Acute Rehab OT Goals Patient Stated Goal: go get better OT Goal Formulation:  With patient Time For Goal Achievement: 11/24/20 Potential to Achieve Goals: Good  Plan Discharge plan remains appropriate;Frequency remains appropriate       AM-PAC OT "6 Clicks" Daily Activity     Outcome Measure   Help from another person eating meals?: A Little Help from another person taking care of personal grooming?: A Little Help from another person toileting, which includes using toliet, bedpan, or urinal?: A Lot Help from another person bathing (including washing, rinsing, drying)?: A Lot Help from another person to put on and taking off regular upper body clothing?: A Little Help from another person to put on and taking off regular lower body clothing?: A Lot 6 Click Score: 15    End of Session    OT Visit Diagnosis: Other abnormalities of gait and mobility (R26.89);Repeated falls (R29.6);Muscle weakness (generalized) (M62.81)   Activity Tolerance Patient limited by lethargy;Patient limited by fatigue   Patient Left in bed;with bed alarm set;with call bell/phone within reach   Nurse Communication Mobility status        Time: 1010-1033 OT Time Calculation (min): 23 min  Charges: OT General Charges $OT Visit: 1 Visit OT Treatments $Self Care/Home Management : 8-22 mins $Therapeutic Activity: 8-22 mins  Darleen Crocker, MS, OTR/L , CBIS ascom [phone]  11/12/20, 10:34 AM

## 2020-11-12 NOTE — Progress Notes (Signed)
Physical Therapy Treatment Patient Details Name: Steve Ramirez MRN: [id number] DOB: 23-Mar-1935 Today's Date: 11/12/2020   History of Present Illness Pt is an 85 y/o M admitted on 11/09/20 after presenting with c/o AMS. Pt was recently hospitalized from 10/23-10/25 with TIA presenting with AMS with negative EEG & MRI. Pt returned to ED on 10/28 with c/o slurred speech that resolved upon arrival; head CT was negative & he was sent back to ALF. Pt was later found slumped over in his chair & stopped talking so was brought back to ED. He showed no focalization of symptoms & no seizure activity. MRI was negative for acute changes. PMH: HTN, DM, multiple CVAs & TIAs, melanoma    PT Comments    Patient received in bed, he is agreeable to PT. Awake, lethargic. He is oriented to person and place. Patient requires min assist with bed mobility, increased time and cues. Slow initiation. He is able to stand with min assist from slightly elevated bed. Patient ambulated with RW to door and back to recliner with min assist. He will continue to benefit from skilled PT while here to improve functional independence and safety with mobility.     Recommendations for follow up therapy are one component of a multi-disciplinary discharge planning process, led by the attending physician.  Recommendations may be updated based on patient status, additional functional criteria and insurance authorization.  Follow Up Recommendations  Skilled nursing-short term rehab (<3 hours/day)     Assistance Recommended at Discharge Intermittent Supervision/Assistance  Equipment Recommendations  None recommended by PT    Recommendations for Other Services       Precautions / Restrictions Precautions Precautions: Fall Restrictions Weight Bearing Restrictions: No     Mobility  Bed Mobility Overal bed mobility: Needs Assistance Bed Mobility: Supine to Sit     Supine to sit: Min assist     General bed mobility  comments: Assist with initiation and to bring LEs of bed.    Transfers Overall transfer level: Needs assistance Equipment used: Rolling walker (2 wheels) Transfers: Sit to/from Stand Sit to Stand: Min assist           General transfer comment: Cues for hand placement on walker. Improved balance from earlier OT session.    Ambulation/Gait Ambulation/Gait assistance: Min guard Gait Distance (Feet): 20 Feet Assistive device: Rolling walker (2 wheels) Gait Pattern/deviations: Decreased step length - right;Decreased stride length;Decreased step length - left Gait velocity: decreased   General Gait Details: fair balance with ambulation of short distance this date.   Stairs             Wheelchair Mobility    Modified Rankin (Stroke Patients Only)       Balance Overall balance assessment: Needs assistance Sitting-balance support: Feet supported Sitting balance-Leahy Scale: Fair     Standing balance support: Bilateral upper extremity supported;During functional activity Standing balance-Leahy Scale: Fair Standing balance comment: RW and min guard with ambulation in room.                            Cognition Arousal/Alertness: Lethargic Behavior During Therapy: Flat affect Overall Cognitive Status: History of cognitive impairments - at baseline Area of Impairment: Orientation;Memory;Following commands;Awareness;Safety/judgement;Problem solving;Attention                 Orientation Level: Disoriented to;Place;Time;Situation Current Attention Level: Sustained Memory: Decreased short-term memory Following Commands: Follows one step commands inconsistently;Follows one step commands with increased time  Safety/Judgement: Decreased awareness of deficits;Decreased awareness of safety Awareness: Intellectual Problem Solving: Slow processing;Requires tactile cues;Requires verbal cues;Difficulty sequencing;Decreased initiation General Comments: Oriented  to self and location. Follows commands with mod cuing and increased time.        Exercises      General Comments        Pertinent Vitals/Pain Pain Assessment: No/denies pain    Home Living                          Prior Function            PT Goals (current goals can now be found in the care plan section) Acute Rehab PT Goals Patient Stated Goal: none stated PT Goal Formulation: Patient unable to participate in goal setting Time For Goal Achievement: 11/24/20 Progress towards PT goals: Progressing toward goals    Frequency    Min 2X/week      PT Plan Current plan remains appropriate    Co-evaluation              AM-PAC PT "6 Clicks" Mobility   Outcome Measure  Help needed turning from your back to your side while in a flat bed without using bedrails?: A Lot Help needed moving from lying on your back to sitting on the side of a flat bed without using bedrails?: A Lot Help needed moving to and from a bed to a chair (including a wheelchair)?: A Little Help needed standing up from a chair using your arms (e.g., wheelchair or bedside chair)?: A Little Help needed to walk in hospital room?: A Little Help needed climbing 3-5 steps with a railing? : A Lot 6 Click Score: 15    End of Session Equipment Utilized During Treatment: Gait belt Activity Tolerance: Patient limited by lethargy Patient left: in chair;with call bell/phone within reach;with chair alarm set Nurse Communication: Mobility status PT Visit Diagnosis: Unsteadiness on feet (R26.81);Muscle weakness (generalized) (M62.81);History of falling (Z91.81);Difficulty in walking, not elsewhere classified (R26.2)     Time: 1540-1600 PT Time Calculation (min) (ACUTE ONLY): 20 min  Charges:  $Gait Training: 8-22 mins                     [employer], PT, GCS 11/12/20,4:23 PM

## 2020-11-13 DIAGNOSIS — G9341 Metabolic encephalopathy: Secondary | ICD-10-CM | POA: Diagnosis not present

## 2020-11-13 LAB — METHYLMALONIC ACID, SERUM: Methylmalonic Acid, Quantitative: 212 nmol/L (ref 0–378)

## 2020-11-13 LAB — GLUCOSE, CAPILLARY
Glucose-Capillary: 148 mg/dL — ABNORMAL HIGH (ref 70–99)
Glucose-Capillary: 202 mg/dL — ABNORMAL HIGH (ref 70–99)
Glucose-Capillary: 130 mg/dL — ABNORMAL HIGH (ref 70–99)
Glucose-Capillary: 223 mg/dL — ABNORMAL HIGH (ref 70–99)
Glucose-Capillary: 184 mg/dL — ABNORMAL HIGH (ref 70–99)

## 2020-11-13 MED ORDER — IPRATROPIUM-ALBUTEROL 0.5-2.5 (3) MG/3ML IN SOLN
3.0000 mL | Freq: Four times a day (QID) | RESPIRATORY_TRACT | Status: DC
  Administered 2020-11-13: 3 mL via RESPIRATORY_TRACT
  Filled 2020-11-13: qty 3

## 2020-11-13 MED ORDER — IPRATROPIUM-ALBUTEROL 0.5-2.5 (3) MG/3ML IN SOLN
3.0000 mL | Freq: Three times a day (TID) | RESPIRATORY_TRACT | Status: DC
Start: 2020-11-14 — End: 2020-11-16
  Administered 2020-11-14 – 2020-11-16 (×7): 3 mL via RESPIRATORY_TRACT
  Filled 2020-11-13 (×7): qty 3

## 2020-11-13 NOTE — Evaluation (Addendum)
Clinical/Bedside Swallow Evaluation Patient Details  Name: NEVEN FINA MRN: [id number] Date of Birth: 19-Apr-1935  Today's Date: 11/13/2020 Time: SLP Start Time (ACUTE ONLY): 1350 SLP Stop Time (ACUTE ONLY): 1445 SLP Time Calculation (min) (ACUTE ONLY): 55 min  Past Medical History:  Past Medical History:  Diagnosis Date   Coronary atherosclerosis of native coronary artery    Essential hypertension, benign    GIB (gastrointestinal bleeding)    Colonic diverticular bleed   History of melanoma    History of TIAs    Stroke ([place])    Type 2 diabetes mellitus ([place])    Past Surgical History:  Past Surgical History:  Procedure Laterality Date   COLONOSCOPY  08/13/2010   Procedure: COLONOSCOPY;  Surgeon: Rogene Houston, MD;  Location: [facility];  Service: Endoscopy;  Laterality: N/A;   JOINT REPLACEMENT     KNEE SURGERY     arthroscopic   LEFT HEART CATHETERIZATION WITH CORONARY ANGIOGRAM N/A 11/24/2010   Procedure: LEFT HEART CATHETERIZATION WITH CORONARY ANGIOGRAM;  Surgeon: Josue Hector, MD;  Location: [facility] CATH LAB;  Service: Cardiovascular;  Laterality: N/A;   neck fusion     years ago   TOTAL HIP ARTHROPLASTY Right 08/02/2018   Procedure: TOTAL HIP ARTHROPLASTY ANTERIOR APPROACH;  Surgeon: Renette Butters, MD;  Location: [facility] ORS;  Service: Orthopedics;  Laterality: Right;   TOTAL HIP ARTHROPLASTY Left 10/18/2018   Procedure: TOTAL HIP ARTHROPLASTY ANTERIOR APPROACH;  Surgeon: Renette Butters, MD;  Location: [facility] ORS;  Service: Orthopedics;  Laterality: Left;   HPI:  Pt is a 85 y.o. male with medical history significant of multiple CVAs and TIAs, essential hypertension, GI bleed, history of melanoma and diabetes, hyperlipidemia who was brought in from home for facial droop and weakness on the left.  Symptoms were transient, resolved.  Pt was initially sleepy/drowsy w/ some change in mental status.  Patient was evaluated by neurology, concerning for TIA versus a seizure.   CXR: bronchitic changes.  MRI/Head CT: no acute findings; Atrophy and extensive chronic small-vessel  ischemic changes. Old lacunar infarctions left thalamus and  posterior limb internal capsule.    Pt had a recent admit on 09/2020 w/ Cognitive-linguistic evaluation indicating Mild deficits w/ Cognitive-linguistic tasks w/ recommendation for f/u w/ Neurology and tx services then.  Pt lives a an [place] w/ some ADLs provided for him now.  Unsure of pt's Baseline Cognitive status but it has been mentioned in MD notes that pt needs a formal work-up for Cognitive decline by Neurology at Discharge (per NSG report).    Assessment / Plan / Recommendation  Clinical Impression  Pt appears to present w/ grossly adequate oropharyngeal phase swallow function though intermittent oral phase holding noted w/ thin liquids -- slight delay/hold followed by swallow. Unsure if this behavior is related to Cognitive decline as oral phase deficits can be c/b such. No overt pharyngeal phase dysphagia or overt clinical s/s of aspiration noted, No neuromuscular weakness noted. Pt consumed po trials w/ min Cues needed intermittently -- pt was easily distracted(again related to Cognitive decline?). Pt appears at reduced risk for aspiration following general aspiration precautions.  During po trials, pt consumed all consistencies w/ no overt coughing, decline in vocal quality, or change in respiratory presentation during/post trials.   Oral phase appeared [facility] w/ bolus management, mastication, and control of bolus propulsion for A-P transfer for swallowing w/ increased textured foods. However, w/ thin liquids via Cup, he tended to take a "standard" sip,  hold slightly and/or piecemeal the bolus. the oral holding was inconsistent but often was 3-5+ seconds in length when it occurred. Pt swallowed when CUED. NSG has noticed this as well -- suspect related to impact from Cognitive decline. Oral clearing achieved w/ all trial  consistencies. Encouraged pt to take Small sips for increased control. OM Exam appeared [facility] w/ no unilateral weakness noted. Speech Clear. Pt fed self w/ setup support.          Recommend a Mech Soft diet consistency w/ well-Cut meats, moistened foods; Thin liquids via Cup - Small sips. Recommend general aspiration precautions, Supervision at meals to follow precautions; swallow/clear mouth. Pills CRUSHED in Puree for safer, easier swallowing d/t suspected Cognitive decline. Education given on Pills in Puree; food consistencies and easy to eat options; general aspiration precautions to both NSG and pt. NSG to reconsult if any new needs arise. NSG agreed. SLP Visit Diagnosis: Dysphagia, oral phase (R13.11);Cognitive communication deficit (R41.841) (baseline)    Aspiration Risk  Mild aspiration risk;Risk for inadequate nutrition/hydration (reduced when following general precautions)    Diet Recommendation   Mech Soft diet consistency w/ well-Cut meats, moistened foods; Thin liquids via Cup. Recommend general aspiration precautions, Reduce Distractions during meals. Supervision at meals for follow through. Eating in a chair at a table(out of bed) is best.   Medication Administration: Crushed with puree    Other  Recommendations Recommended Consults:  (Dietician f/u; Neurology f/u for Cognitive assessment) Oral Care Recommendations: Oral care BID;Oral care before and after PO;Patient independent with oral care Other Recommendations:  (n/a)    Recommendations for follow up therapy are one component of a multi-disciplinary discharge planning process, led by the attending physician.  Recommendations may be updated based on patient status, additional functional criteria and insurance authorization.  Follow up Recommendations None      Frequency and Duration  (n/a)   (n/a)       Prognosis Prognosis for Safe Diet Advancement: Good Barriers to Reach Goals: Cognitive deficits;Time post onset;Severity  of deficits Barriers/Prognosis Comment: Cognitive impact      Swallow Study   General Date of Onset: 11/03/20 HPI: Pt is a 85 y.o. male with medical history significant of multiple CVAs and TIAs, essential hypertension, GI bleed, history of melanoma and diabetes, hyperlipidemia who was brought in from home for facial droop and weakness on the left.  Symptoms were transient, resolved.  Pt was initially sleepy/drowsy w/ some change in mental status.  Patient was evaluated by neurology, concerning for TIA versus a seizure.  CXR: bronchitic changes.  MRI/Head CT: no acute findings; Atrophy and extensive chronic small-vessel  ischemic changes. Old lacunar infarctions left thalamus and  posterior limb internal capsule.  Pt had a recent admit on 09/2020 w/ Cognitive-linguistic evaluation indicating Mild deficits w/ Cognitive-linguistic tasks w/ recommendation for f/u w/ Neurology and tx services then.  Pt lives a an [place] w/ some ADLs provided for him now.  Unsure of pt's Baseline Cognitive status but it has been mentioned in MD notes that pt needs a formal work-up for Cognitive decline by Neurology at Discharge (per NSG report). Type of Study: Bedside Swallow Evaluation (repeat evaluation per MD request) Previous Swallow Assessment: 11/2016 - BSE; Cog-linguistic eval also.  BSE this admit. Diet Prior to this Study: Dysphagia 3 (soft);Thin liquids Temperature Spikes Noted: No (wbc 4.6) Respiratory Status: Room air History of Recent Intubation: No Behavior/Cognition: Alert;Cooperative;Pleasant mood;Confused;Distractible;Requires cueing (mild Confusion noted during engagement, verbal responses) Oral Cavity Assessment: Within Functional  Limits Oral Care Completed by SLP: Yes Oral Cavity - Dentition: Adequate natural dentition Vision: Functional for self-feeding Self-Feeding Abilities: Able to feed self;Needs set up Patient Positioning: Upright in bed (supported more Midline w/ Pillows) Baseline Vocal  Quality: Normal Volitional Cough: Strong Volitional Swallow: Able to elicit    Oral/Motor/Sensory Function Overall Oral Motor/Sensory Function: Within functional limits   Ice Chips Ice chips: Not tested   Thin Liquid Thin Liquid: Impaired Presentation: Cup;Self Fed (10 trials) Oral Phase Impairments: Poor awareness of bolus Oral Phase Functional Implications: Prolonged oral transit (slight hold b/f swallowing) Other Comments: following general aspiration precautions    Nectar Thick Nectar Thick Liquid: Not tested   Honey Thick Honey Thick Liquid: Not tested   Puree Puree: Within functional limits Presentation: Spoon;Self Fed (~2-3 ozs of pudding) Other Comments: slight hold x1-2   Solid     Solid: Within functional limits Presentation: Self Fed;Spoon (7 trials of graham cracker w/ pudding) Other Comments: took his time chewing        Orinda Kenner, [place], [place] Speech Language Pathologist Rehab Services [phone] Ryszard Socarras 11/13/2020,10:26 AM

## 2020-11-13 NOTE — Procedures (Signed)
Routine EEG Report  Steve Ramirez is a 85 y.o. male with a history of spells who is undergoing an EEG to evaluate for seizures.  Report: This EEG was acquired with electrodes placed according to the International 10-20 electrode system (including Fp1, Fp2, F3, F4, C3, C4, P3, P4, O1, O2, T3, T4, T5, T6, A1, A2, Fz, Cz, Pz). The following electrodes were missing or displaced: none.  The occipital dominant rhythm was 8.5 Hz. This activity is reactive to stimulation. Drowsiness was manifested by background fragmentation; deeper stages of sleep were not identified. There was no focal slowing. There were no interictal epileptiform discharges. There were no electrographic seizures identified. There was no abnormal response to photic stimulation or hyperventilation.   Impression: This EEG was obtained while awake and drowsy and is normal.    Clinical Correlation: Normal EEGs, however, do not rule out epilepsy.  Su Monks, MD [facility] [phone]  If 7pm- 7am, please page neurology on call as listed in [place].

## 2020-11-13 NOTE — TOC Initial Note (Signed)
Transition of Care [facility]) - Initial/Assessment Note    Patient Details  Name: Steve Ramirez MRN: [id number] Date of Birth: February 23, 1935  Transition of [facility]) CM/SW Contact:    Alberteen Sam, LCSW Phone Number: 11/13/2020, 3:20 PM  Clinical Narrative:                  CSW notes patient from [facility] with SNF rec, CSW notes patient is not fully oriented. Called and lvm with son [name] requesting if agreeable to SNF and any preference of facility. Pending call back.    Expected Discharge Plan: Skilled Nursing Facility Barriers to Discharge: Continued Medical Work up   Patient Goals and CMS Choice   CMS Medicare.gov Compare Post Acute Care list provided to:: Patient Represenative (must comment) (son [name]) Choice offered to / list presented to : Adult Children (son [name])  Expected Discharge Plan and Services Expected Discharge Plan: [place] arrangements for the past 2 months: [place] Passenger transport manager)                                      Prior Living Arrangements/Services Living arrangements for the past 2 months: [place] Passenger transport manager) Lives with:: Self Patient language and need for interpreter reviewed:: Yes        Need for Family Participation in Patient Care: Yes (Comment) Care giver support system in place?: Yes (comment)   Criminal Activity/Legal Involvement Pertinent to Current Situation/Hospitalization: No - Comment as needed  Activities of Daily Living      Permission Sought/Granted Permission sought to share information with : Case Manager, Facility Sport and exercise psychologist, Family Supports    Share Information with NAME: [name]  Permission granted to share info w AGENCY: SNFs  Permission granted to share info w Relationship: son  Permission granted to share info w Contact Information: [phone]  Emotional Assessment       Orientation: : Oriented to Self, Oriented to  Place Alcohol / Substance Use: Not Applicable Psych Involvement: No (comment)  Admission diagnosis:  Altered mental status [R41.82] Pain [R52] Altered mental status, unspecified altered mental status type [R41.82] AMS (altered mental status) [R41.82] Patient Active Problem List   Diagnosis Date Noted   Pressure injury of skin 11/12/2020   AMS (altered mental status) 11/11/2020   Altered mental status 11/09/2020   Hypertensive urgency 11/09/2020   Depression 10/03/2020   Hypothyroidism 10/03/2020   CAD (coronary artery disease) 30/16/0109   Acute metabolic encephalopathy 32/35/5732   Primary osteoarthritis of left hip 09/14/2018   Primary localized osteoarthritis of hip 08/02/2018   Primary osteoarthritis of right hip 07/07/2018   Near syncope 12/03/2016   CVA (cerebral vascular accident) ([place]) 12/03/2016   Right hemiparesis ([place]) 12/02/2016   Acute ischemic stroke ([place]) 12/02/2016   Essential hypertension 12/02/2016   Dysphagia 12/02/2016   Acute CVA (cerebrovascular accident) ([place]) 09/13/2015   TIA (transient ischemic attack) 11/29/2014   Shoulder stiffness 09/05/2012   Right rotator cuff tear 08/16/2012   Shoulder subluxation, right 08/09/2012   Elevated transaminase level 11/05/2011   Coronary atherosclerosis of native coronary artery 11/26/2010   Essential hypertension, benign 11/23/2010   Diabetes mellitus without complication ([place]) 20/25/4270   Hyperlipidemia 11/23/2010   History of TIAs 11/23/2010   PCP:  Sharilyn Sites, MD Pharmacy:   [place], [place] - [phone] [place] [address] Phone: [phone] Fax: [phone]     Social Determinants of Health (SDOH) Interventions    Readmission Risk Interventions No flowsheet data found.

## 2020-11-13 NOTE — Progress Notes (Signed)
Occupational Therapy Treatment Patient Details Name: KARIS EMIG MRN: [id number] DOB: 1935/06/03 Today's Date: 11/13/2020   History of present illness Pt is an 85 y/o M admitted on 11/09/20 after presenting with c/o AMS. Pt was recently hospitalized from 10/23-10/25 with TIA presenting with AMS with negative EEG & MRI. Pt returned to ED on 10/28 with c/o slurred speech that resolved upon arrival; head CT was negative & he was sent back to ALF. Pt was later found slumped over in his chair & stopped talking so was brought back to ED. He showed no focalization of symptoms & no seizure activity. MRI was negative for acute changes. PMH: HTN, DM, multiple CVAs & TIAs, melanoma   OT comments  Pt seen for OT treatment on this date. Upon arrival to room, pt supine in bed with daughter present. Pt with improved alertness compared to OT evaluation, however with eyes closed for majority of session. Pt oriented to self only. Pt currently presents with decreased cognition and decreased activity tolerance. Pt required MAX A for supine>sit transfer. Once seated EOB, pt with posterior lean, requiring MIN-MOD A assist at all times. While sitting EOB, pt engaged in seated grooming tasks, requiring continued trunk support and MIN A for thoroughness. Further mobility deferred in setting of poor attention and command follow. Pt required MAX A for sit>supine transfer and MAX A+2 to scoot toward HOB. Of note, pt observed to have some wheezing while seated EOB (SpO2 >95%), with daughter reporting new symptom; RN informed. Pt continues to benefit from skilled OT services to maximize return to PLOF and minimize risk of future falls, injury, caregiver burden, and readmission. Will continue to follow POC. Discharge recommendation remains appropriate.     Recommendations for follow up therapy are one component of a multi-disciplinary discharge planning process, led by the attending physician.  Recommendations may be updated  based on patient status, additional functional criteria and insurance authorization.    Follow Up Recommendations  Skilled nursing-short term rehab (<3 hours/day)    Assistance Recommended at Discharge Frequent or constant Supervision/Assistance  Equipment Recommendations  Other (comment) (defer to next venue of care)       Precautions / Restrictions Precautions Precautions: Fall Restrictions Weight Bearing Restrictions: No       Mobility Bed Mobility Overal bed mobility: Needs Assistance Bed Mobility: Supine to Sit;Sit to Supine     Supine to sit: Max assist Sit to supine: Max assist;+2 for safety/equipment   General bed mobility comments: +2 assistance to shift toward [facility]    Transfers                   General transfer comment: Unsafe to attempt this date d/t poor command follow     Balance Overall balance assessment: Needs assistance Sitting-balance support: Feet supported;No upper extremity supported Sitting balance-Leahy Scale: Poor Sitting balance - Comments: Pt with posterior lean throughout session, requiring at least MIN-MOD A at all times (occassionally requiring MAX A) and verbal cues for body position                                   ADL either performed or assessed with clinical judgement   ADL Overall ADL's : Needs assistance/impaired     Grooming: Brushing hair;Minimal assistance;Sitting Grooming Details (indicate cue type and reason): Requires MIN A for thoroughness, in addition to intermittent MOD-MAX A for sitting balance at EOB  Cognition Arousal/Alertness: Lethargic Behavior During Therapy: Flat affect Overall Cognitive Status: History of cognitive impairments - at baseline Area of Impairment: Orientation;Memory;Following commands;Awareness;Safety/judgement;Problem solving;Attention                 Orientation Level: Disoriented  to;Place;Time;Situation Current Attention Level: Focused Memory: Decreased short-term memory Following Commands: Follows one step commands inconsistently Safety/Judgement: Decreased awareness of deficits;Decreased awareness of safety Awareness: Intellectual Problem Solving: Slow processing;Requires tactile cues;Requires verbal cues;Difficulty sequencing;Decreased initiation General Comments: Pt oriented to self only. Pt's eyes open for only ~80% of session. Requires multi-modal cues for command follow.                General Comments Pt observed to have some wheezing while seated EOB (SpO2 >95%), with daughter reporting it is new. RN informed    Pertinent Vitals/ Pain       Pain Assessment: No/denies pain         Frequency  Min 1X/week        Progress Toward Goals  OT Goals(current goals can now be found in the care plan section)  Progress towards OT goals: Progressing toward goals  Acute Rehab OT Goals Patient Stated Goal: go get better OT Goal Formulation: With patient Time For Goal Achievement: 11/24/20 Potential to Achieve Goals: Good  Plan Discharge plan remains appropriate;Frequency remains appropriate       AM-PAC OT "6 Clicks" Daily Activity     Outcome Measure   Help from another person eating meals?: A Little Help from another person taking care of personal grooming?: A Little Help from another person toileting, which includes using toliet, bedpan, or urinal?: A Lot Help from another person bathing (including washing, rinsing, drying)?: A Lot Help from another person to put on and taking off regular upper body clothing?: A Little Help from another person to put on and taking off regular lower body clothing?: A Lot 6 Click Score: 15    End of Session    OT Visit Diagnosis: Other abnormalities of gait and mobility (R26.89);Repeated falls (R29.6);Muscle weakness (generalized) (M62.81)   Activity Tolerance Patient limited by lethargy;Patient limited by  fatigue   Patient Left in bed;with bed alarm set;with call bell/phone within reach;with family/visitor present   Nurse Communication Mobility status        Time: 3557-3220 OT Time Calculation (min): 23 min  Charges: OT General Charges $OT Visit: 1 Visit OT Treatments $Self Care/Home Management : 23-37 mins  Fredirick Maudlin, [place]

## 2020-11-13 NOTE — Progress Notes (Signed)
PROGRESS NOTE    Steve Ramirez  PXT:[id number] DOB: 1935-12-11 DOA: 11/09/2020 PCP: Sharilyn Sites, MD   Brief Narrative:  This 85 yrs old  male with medical history significant for multiple strokes, recently discharged from the hospital on 11/05/2020 for TIA, hypertension, GI bleed, type 2 diabetes mellitus, hyperlipidemia, history of melanoma, who was brought to the hospital because of altered mental status.  He initially presented to the emergency room on 11/08/2020 with slurred speech.  CT head did not show any acute abnormality and was discharged back to the assisted living facility.  On 11/09/2020 around 5 PM, he was found slumped in his chair and was brought back to the hospital.   He is admitted to the hospital for acute confusional state. Etiology of change in mental status is not clear.  MRI brain with and without contrast did not show any acute abnormality.  He also had hypertensive urgency which was treated with  antihypertensives.  Assessment & Plan:   Principal Problem:   Acute metabolic encephalopathy Active Problems:   Diabetes mellitus without complication (HCC)   History of TIAs   Essential hypertension   Hypothyroidism   CAD (coronary artery disease)   Hypertensive urgency   AMS (altered mental status)   Pressure injury of skin  Acute confusional state likely underlying dementia: > Improving Etiology is unclear.  MRI brain with and without contrast did not show any acute abnormality.   His son, [name], reports he has noticed gradual change in mental status/confusion about 6 weeks prior to admission. Neurologist recommended Aricept nightly and outpatient work-up for dementia.   He also recommended repeat EEG if mental status does not improve by Wednesday, 11/13/2020. However, mental status seems to be improving and he may not need repeat EEG.   IV fluid discontinued. Consult speech therapist for swallow evaluation. Patient  still appears confused.  Will obtain  repeat EEG.  CAD, history of multiple TIAs and stroke: Continue aspirin, Plavix and pravastatin as able.     Hypertensive urgency: Blood pressure is improving  Continue antihypertensives.  Chronic diastolic CHF; compensated. 2D echo on 10/24/2020 showed normal EF and grade 2 diastolic dysfunction. Continue antihypertensive medications.  Type 2 diabetes:  Hemoglobin A1c 6.5.  Continue regular insulin sliding scale.  Possible discharge to SNF tomorrow if mental status continues to improve.    DVT prophylaxis: Lovenox Code Status: Full code. Family Communication: No family at bed side. Disposition Plan:   Status is: Inpatient  Remains inpatient appropriate because: Acute confusional state.  Requiring work-up.  Anticipated discharge back to SNF in 1 to 2 days.  Consultants:  Neurology  Procedures: MRI, EEG Antimicrobials:   Anti-infectives (From admission, onward)    None        Subjective: Patient was seen and examined at bedside.  Overnight events noted. Patient still appears confused,  he does not know where he is.  Objective: Vitals:   11/13/20 0416 11/13/20 0417 11/13/20 0800 11/13/20 1134  BP: (!) 179/83 (!) 169/81 (!) 175/79 (!) 177/71  Pulse: 81 87 80 83  Resp: 18 18 18 19   Temp: 98.4 F (36.9 C) 98.4 F (36.9 C) 97.8 F (36.6 C) 97.7 F (36.5 C)  TempSrc:      SpO2: 100% 100% 95% 92%  Weight:      Height:        Intake/Output Summary (Last 24 hours) at 11/13/2020 1456 Last data filed at 11/13/2020 1132 Gross per 24 hour  Intake 300 ml  Output  800 ml  Net -500 ml   Filed Weights   11/09/20 1955  Weight: 81.6 kg    Examination:  General exam: Appears comfortable, not in any acute distress, deconditioned. Respiratory system: Clear to auscultation. Respiratory effort normal. Cardiovascular system: S1-S2 heard, regular rate and rhythm, no murmur.   Gastrointestinal system: Abdomen is soft, nontender, nondistended, BS+ Central nervous  system: Alert and oriented x 1. No focal neurological deficits. Extremities: No edema, no cyanosis, no clubbing. Skin: No rashes, lesions or ulcers Psychiatry: Judgement and insight appear normal. Mood & affect appropriate.     Data Reviewed: I have personally reviewed following labs and imaging studies  CBC: Recent Labs  Lab 11/08/20 1124 11/09/20 2003 11/12/20 0618  WBC 5.9 5.1  6.5 4.6  NEUTROABS 3.4 3.5 3.3  HGB 12.5* 12.9*  12.9* 13.3  HCT 40.8 40.2  42.1 42.0  MCV 72.6* 71.3*  73.0* 70.5*  PLT 287 247  253 779   Basic Metabolic Panel: Recent Labs  Lab 11/08/20 1124 11/09/20 2003 11/12/20 0618  NA 139 134* 136  K 4.5 5.0 3.6  CL 109 105 107  CO2 24 19* 19*  GLUCOSE 120* 152* 141*  BUN 20 16 18   CREATININE 0.75 0.80 0.73  CALCIUM 9.7 9.3 8.8*   GFR: Estimated Creatinine Clearance: 67.7 mL/min (by C-G formula based on SCr of 0.73 mg/dL). Liver Function Tests: Recent Labs  Lab 11/08/20 1124  AST 17  ALT 18  ALKPHOS 61  BILITOT 0.8  PROT 7.5  ALBUMIN 4.6   No results for input(s): LIPASE, AMYLASE in the last 168 hours. Recent Labs  Lab 11/12/20 0618  AMMONIA 19   Coagulation Profile: Recent Labs  Lab 11/08/20 1124 11/09/20 2003  INR 1.1 1.1   Cardiac Enzymes: No results for input(s): CKTOTAL, CKMB, CKMBINDEX, TROPONINI in the last 168 hours. BNP (last 3 results) No results for input(s): PROBNP in the last 8760 hours. HbA1C: No results for input(s): HGBA1C in the last 72 hours. CBG: Recent Labs  Lab 11/12/20 1120 11/12/20 1609 11/12/20 2031 11/13/20 0759 11/13/20 1130  GLUCAP 155* 154* 149* 148* 202*   Lipid Profile: No results for input(s): CHOL, HDL, LDLCALC, TRIG, CHOLHDL, LDLDIRECT in the last 72 hours. Thyroid Function Tests: No results for input(s): TSH, T4TOTAL, FREET4, T3FREE, THYROIDAB in the last 72 hours. Anemia Panel: No results for input(s): VITAMINB12, FOLATE, FERRITIN, TIBC, IRON, RETICCTPCT in the last 72  hours. Sepsis Labs: No results for input(s): PROCALCITON, LATICACIDVEN in the last 168 hours.  Recent Results (from the past 240 hour(s))  Resp Panel by RT-PCR (Flu A&B, Covid) Nasopharyngeal Swab     Status: None   Collection Time: 11/03/20  6:55 PM   Specimen: Nasopharyngeal Swab; Nasopharyngeal(NP) swabs in vial transport medium  Result Value Ref Range Status   SARS Coronavirus 2 by RT PCR NEGATIVE NEGATIVE Final    Comment: (NOTE) SARS-CoV-2 target nucleic acids are NOT DETECTED.  The SARS-CoV-2 RNA is generally detectable in upper respiratory specimens during the acute phase of infection. The lowest concentration of SARS-CoV-2 viral copies this assay can detect is 138 copies/mL. A negative result does not preclude SARS-Cov-2 infection and should not be used as the sole basis for treatment or other patient management decisions. A negative result may occur with  improper specimen collection/handling, submission of specimen other than nasopharyngeal swab, presence of viral mutation(s) within the areas targeted by this assay, and inadequate number of viral copies(<138 copies/mL). A negative result must be combined  with clinical observations, patient history, and epidemiological information. The expected result is Negative.  Fact Sheet for Patients:  EntrepreneurPulse.com.au  Fact Sheet for Healthcare Providers:  IncredibleEmployment.be  This test is no t yet approved or cleared by the [place] FDA and  has been authorized for detection and/or diagnosis of SARS-CoV-2 by FDA under an Emergency Use Authorization (EUA). This EUA will remain  in effect (meaning this test can be used) for the duration of the COVID-19 declaration under Section 564(b)(1) of the Act, 21 U.S.C.section 360bbb-3(b)(1), unless the authorization is terminated  or revoked sooner.       Influenza A by PCR NEGATIVE NEGATIVE Final   Influenza B by PCR NEGATIVE  NEGATIVE Final    Comment: (NOTE) The Xpert Xpress SARS-CoV-2/FLU/RSV plus assay is intended as an aid in the diagnosis of influenza from Nasopharyngeal swab specimens and should not be used as a sole basis for treatment. Nasal washings and aspirates are unacceptable for Xpert Xpress SARS-CoV-2/FLU/RSV testing.  Fact Sheet for Patients: EntrepreneurPulse.com.au  Fact Sheet for Healthcare Providers: IncredibleEmployment.be  This test is not yet approved or cleared by the [place] FDA and has been authorized for detection and/or diagnosis of SARS-CoV-2 by FDA under an Emergency Use Authorization (EUA). This EUA will remain in effect (meaning this test can be used) for the duration of the COVID-19 declaration under Section 564(b)(1) of the Act, 21 U.S.C. section 360bbb-3(b)(1), unless the authorization is terminated or revoked.  Performed at [facility], [address] [place], [place] [address]   Resp Panel by RT-PCR (Flu A&B, Covid) Nasopharyngeal Swab     Status: None   Collection Time: 11/08/20 11:24 AM   Specimen: Nasopharyngeal Swab; Nasopharyngeal(NP) swabs in vial transport medium  Result Value Ref Range Status   SARS Coronavirus 2 by RT PCR NEGATIVE NEGATIVE Final    Comment: (NOTE) SARS-CoV-2 target nucleic acids are NOT DETECTED.  The SARS-CoV-2 RNA is generally detectable in upper respiratory specimens during the acute phase of infection. The lowest concentration of SARS-CoV-2 viral copies this assay can detect is 138 copies/mL. A negative result does not preclude SARS-Cov-2 infection and should not be used as the sole basis for treatment or other patient management decisions. A negative result may occur with  improper specimen collection/handling, submission of specimen other than nasopharyngeal swab, presence of viral mutation(s) within the areas targeted by this assay, and inadequate number of viral copies(<138  copies/mL). A negative result must be combined with clinical observations, patient history, and epidemiological information. The expected result is Negative.  Fact Sheet for Patients:  EntrepreneurPulse.com.au  Fact Sheet for Healthcare Providers:  IncredibleEmployment.be  This test is no t yet approved or cleared by the [place] FDA and  has been authorized for detection and/or diagnosis of SARS-CoV-2 by FDA under an Emergency Use Authorization (EUA). This EUA will remain  in effect (meaning this test can be used) for the duration of the COVID-19 declaration under Section 564(b)(1) of the Act, 21 U.S.C.section 360bbb-3(b)(1), unless the authorization is terminated  or revoked sooner.       Influenza A by PCR NEGATIVE NEGATIVE Final   Influenza B by PCR NEGATIVE NEGATIVE Final    Comment: (NOTE) The Xpert Xpress SARS-CoV-2/FLU/RSV plus assay is intended as an aid in the diagnosis of influenza from Nasopharyngeal swab specimens and should not be used as a sole basis for treatment. Nasal washings and aspirates are unacceptable for Xpert Xpress SARS-CoV-2/FLU/RSV testing.  Fact Sheet for Patients: EntrepreneurPulse.com.au  Fact Sheet for Healthcare Providers: IncredibleEmployment.be  This test is not yet approved or cleared by the [place] FDA and has been authorized for detection and/or diagnosis of SARS-CoV-2 by FDA under an Emergency Use Authorization (EUA). This EUA will remain in effect (meaning this test can be used) for the duration of the COVID-19 declaration under Section 564(b)(1) of the Act, 21 U.S.C. section 360bbb-3(b)(1), unless the authorization is terminated or revoked.  Performed at [facility], [address] [place], [place] [address]   Resp Panel by RT-PCR (Flu A&B, Covid) Nasopharyngeal Swab     Status: None   Collection Time: 11/09/20  9:14 PM   Specimen:  Nasopharyngeal Swab; Nasopharyngeal(NP) swabs in vial transport medium  Result Value Ref Range Status   SARS Coronavirus 2 by RT PCR NEGATIVE NEGATIVE Final    Comment: (NOTE) SARS-CoV-2 target nucleic acids are NOT DETECTED.  The SARS-CoV-2 RNA is generally detectable in upper respiratory specimens during the acute phase of infection. The lowest concentration of SARS-CoV-2 viral copies this assay can detect is 138 copies/mL. A negative result does not preclude SARS-Cov-2 infection and should not be used as the sole basis for treatment or other patient management decisions. A negative result may occur with  improper specimen collection/handling, submission of specimen other than nasopharyngeal swab, presence of viral mutation(s) within the areas targeted by this assay, and inadequate number of viral copies(<138 copies/mL). A negative result must be combined with clinical observations, patient history, and epidemiological information. The expected result is Negative.  Fact Sheet for Patients:  EntrepreneurPulse.com.au  Fact Sheet for Healthcare Providers:  IncredibleEmployment.be  This test is no t yet approved or cleared by the [place] FDA and  has been authorized for detection and/or diagnosis of SARS-CoV-2 by FDA under an Emergency Use Authorization (EUA). This EUA will remain  in effect (meaning this test can be used) for the duration of the COVID-19 declaration under Section 564(b)(1) of the Act, 21 U.S.C.section 360bbb-3(b)(1), unless the authorization is terminated  or revoked sooner.       Influenza A by PCR NEGATIVE NEGATIVE Final   Influenza B by PCR NEGATIVE NEGATIVE Final    Comment: (NOTE) The Xpert Xpress SARS-CoV-2/FLU/RSV plus assay is intended as an aid in the diagnosis of influenza from Nasopharyngeal swab specimens and should not be used as a sole basis for treatment. Nasal washings and aspirates are unacceptable for  Xpert Xpress SARS-CoV-2/FLU/RSV testing.  Fact Sheet for Patients: EntrepreneurPulse.com.au  Fact Sheet for Healthcare Providers: IncredibleEmployment.be  This test is not yet approved or cleared by the [place] FDA and has been authorized for detection and/or diagnosis of SARS-CoV-2 by FDA under an Emergency Use Authorization (EUA). This EUA will remain in effect (meaning this test can be used) for the duration of the COVID-19 declaration under Section 564(b)(1) of the Act, 21 U.S.C. section 360bbb-3(b)(1), unless the authorization is terminated or revoked.  Performed at [facility], [address] [place], [place] [address]   MRSA Next Gen by PCR, Nasal     Status: None   Collection Time: 11/12/20  7:40 PM   Specimen: Nasal Mucosa; Nasal Swab  Result Value Ref Range Status   MRSA by PCR Next Gen NOT DETECTED NOT DETECTED Final    Comment: (NOTE) The GeneXpert MRSA Assay (FDA approved for NASAL specimens only), is one component of a comprehensive MRSA colonization surveillance program. It is not intended to diagnose MRSA infection nor to guide or monitor treatment for  MRSA infections. Test performance is not FDA approved in patients less than 19 years old. Performed at [facility], [address] [place],  [address]     Radiology Studies: No results found.  Scheduled Meds:   stroke: mapping our early stages of recovery book   Does not apply Once   amLODipine  10 mg Oral Daily   aspirin EC  81 mg Oral BID   cloNIDine  0.3 mg Transdermal Q Mon   clopidogrel  75 mg Oral Daily   enoxaparin (LOVENOX) injection  40 mg Subcutaneous Q24H   ferrous sulfate  325 mg Oral Q breakfast   insulin aspart  0-15 Units Subcutaneous TID WC   insulin aspart  0-5 Units Subcutaneous QHS   isosorbide mononitrate  30 mg Oral QPM   levothyroxine  50 mcg Oral Q0600   lisinopril  40 mg Oral QHS   loratadine  10 mg Oral  Daily   metoprolol succinate  25 mg Oral QPM   pravastatin  20 mg Oral QHS   rOPINIRole  1 mg Oral QHS   spironolactone  25 mg Oral QHS   vitamin B-12  1,000 mcg Oral Daily   Continuous Infusions:   LOS: 2 days    Time spent: 35 mins    Kareli Hossain, MD [facility]   If 7PM-7AM, please contact night-coverage

## 2020-11-13 NOTE — Progress Notes (Signed)
Eeg done 

## 2020-11-13 NOTE — NC FL2 (Signed)
[place] LEVEL OF CARE SCREENING TOOL     IDENTIFICATION  Patient Name: Steve Ramirez Birthdate: 09/04/1935 Sex: male Admission Date (Current Location): 11/09/2020  [facility] and [place] Number:  Engineering geologist and Address:  [facility], [address] [place], [place] [address]      Provider Number: [id number]  Attending Physician Name and Address:  Shawna Clamp, MD  Relative Name and Phone Number:  [name] (son) [phone]    Current Level of Care: Hospital Recommended Level of Care: [place] Prior Approval Number:    Date Approved/Denied:   PASRR Number: [id number]  Discharge Plan: SNF    Current Diagnoses: Patient Active Problem List   Diagnosis Date Noted   Pressure injury of skin 11/12/2020   AMS (altered mental status) 11/11/2020   Altered mental status 11/09/2020   Hypertensive urgency 11/09/2020   Depression 10/03/2020   Hypothyroidism 10/03/2020   CAD (coronary artery disease) 12/45/8099   Acute metabolic encephalopathy 83/38/2505   Primary osteoarthritis of left hip 09/14/2018   Primary localized osteoarthritis of hip 08/02/2018   Primary osteoarthritis of right hip 07/07/2018   Near syncope 12/03/2016   CVA (cerebral vascular accident) ([place]) 12/03/2016   Right hemiparesis ([place]) 12/02/2016   Acute ischemic stroke ([place]) 12/02/2016   Essential hypertension 12/02/2016   Dysphagia 12/02/2016   Acute CVA (cerebrovascular accident) ([place]) 09/13/2015   TIA (transient ischemic attack) 11/29/2014   Shoulder stiffness 09/05/2012   Right rotator cuff tear 08/16/2012   Shoulder subluxation, right 08/09/2012   Elevated transaminase level 11/05/2011   Coronary atherosclerosis of native coronary artery 11/26/2010   Essential hypertension, benign 11/23/2010   Diabetes mellitus without complication ([place]) 39/76/7341   Hyperlipidemia 11/23/2010   History of TIAs 11/23/2010    Orientation  RESPIRATION BLADDER Height & Weight     Self, Situation  Normal Incontinent, External catheter Weight: 179 lb 15.7 oz (81.6 kg) Height:  5\' 8"  (172.7 cm)  BEHAVIORAL SYMPTOMS/MOOD NEUROLOGICAL BOWEL NUTRITION STATUS      Continent Diet (see discharge summary)  AMBULATORY STATUS COMMUNICATION OF NEEDS Skin   Extensive Assist Verbally Other (Comment) (stage 1 pressure injury buttocks)                       Personal Care Assistance Level of Assistance  Bathing, Feeding, Dressing, Total care Bathing Assistance: Limited assistance Feeding assistance: Maximum assistance Dressing Assistance: Maximum assistance Total Care Assistance: Maximum assistance   Functional Limitations Info  Sight, Hearing, Speech Sight Info: Impaired Hearing Info: Impaired Speech Info: Adequate    SPECIAL CARE FACTORS FREQUENCY  PT (By licensed PT), OT (By licensed OT)     PT Frequency: min 4x weekly OT Frequency: min 4x weekly            Contractures Contractures Info: Not present    Additional Factors Info  Code Status, Allergies Code Status Info: full Allergies Info: no known allergies           Current Medications (11/13/2020):  This is the current hospital active medication list Current Facility-Administered Medications  Medication Dose Route Frequency Provider Last Rate Last Admin    stroke: mapping our early stages of recovery book   Does not apply Once Athena Masse, MD       acetaminophen (TYLENOL) tablet 650 mg  650 mg Oral Q4H PRN Athena Masse, MD       Or   acetaminophen (TYLENOL) 160 MG/5ML solution 650  mg  650 mg Per Tube Q4H PRN Athena Masse, MD       Or   acetaminophen (TYLENOL) suppository 650 mg  650 mg Rectal Q4H PRN Athena Masse, MD       amLODipine (NORVASC) tablet 10 mg  10 mg Oral Daily Jennye Boroughs, MD   10 mg at 11/13/20 0859   aspirin EC tablet 81 mg  81 mg Oral BID Jennye Boroughs, MD   81 mg at 11/13/20 0859   cloNIDine (CATAPRES - Dosed in mg/24  hr) patch 0.3 mg  0.3 mg Transdermal Q Wandra Arthurs, Ilona Sorrel, MD   0.3 mg at 11/11/20 1133   clopidogrel (PLAVIX) tablet 75 mg  75 mg Oral Daily Jennye Boroughs, MD   75 mg at 11/13/20 0859   enoxaparin (LOVENOX) injection 40 mg  40 mg Subcutaneous Q24H Judd Gaudier V, MD   40 mg at 11/12/20 2205   ferrous sulfate tablet 325 mg  325 mg Oral Q breakfast Jennye Boroughs, MD   325 mg at 11/13/20 0859   insulin aspart (novoLOG) injection 0-15 Units  0-15 Units Subcutaneous TID WC Judd Gaudier V, MD   5 Units at 11/13/20 1207   insulin aspart (novoLOG) injection 0-5 Units  0-5 Units Subcutaneous QHS Athena Masse, MD       isosorbide mononitrate (IMDUR) 24 hr tablet 30 mg  30 mg Oral QPM Jennye Boroughs, MD   30 mg at 11/12/20 1756   labetalol (NORMODYNE) injection 10 mg  10 mg Intravenous Q2H PRN Jennye Boroughs, MD       levothyroxine (SYNTHROID) tablet 50 mcg  50 mcg Oral Q0600 Jennye Boroughs, MD   50 mcg at 11/13/20 0558   lisinopril (ZESTRIL) tablet 40 mg  40 mg Oral QHS Jennye Boroughs, MD   40 mg at 11/12/20 2156   loratadine (CLARITIN) tablet 10 mg  10 mg Oral Daily Jennye Boroughs, MD   10 mg at 11/13/20 0859   metoprolol succinate (TOPROL-XL) 24 hr tablet 25 mg  25 mg Oral QPM Jennye Boroughs, MD   25 mg at 11/12/20 1756   pravastatin (PRAVACHOL) tablet 20 mg  20 mg Oral QHS Jennye Boroughs, MD   20 mg at 11/12/20 2156   rOPINIRole (REQUIP) tablet 1 mg  1 mg Oral QHS Jennye Boroughs, MD   1 mg at 11/12/20 2156   senna-docusate (Senokot-S) tablet 1 tablet  1 tablet Oral QHS PRN Athena Masse, MD       spironolactone (ALDACTONE) tablet 25 mg  25 mg Oral QHS Jennye Boroughs, MD   25 mg at 11/12/20 2156   vitamin B-12 (CYANOCOBALAMIN) tablet 1,000 mcg  1,000 mcg Oral Daily Jennye Boroughs, MD   1,000 mcg at 11/13/20 8016     Discharge Medications: Please see discharge summary for a list of discharge medications.  Relevant Imaging Results:  Relevant Lab Results:   Additional Information SSN:  [id number]  Alberteen Sam, LCSW

## 2020-11-13 NOTE — Progress Notes (Signed)
SLP Cancellation Note  Patient Details Name: Steve Ramirez MRN: [id number] DOB: September 21, 1935   Cancelled treatment:       Reason Eval/Treat Not Completed:  (chart reviewed - Neurology's notes). Per Neurology notes and previous Cognitive-linguistic evaluations during admits to the Hospital, pt needs f/u w/ Neurology for formal work-up d/t concern of: "Will need outpatient Neurology follow up and possible dementia work up. DDx includes Lewy body dementia overlapping multiinfarct dementia given prior strokes as well as his multiple episodes of recurrent AMS (Lewy body dementia is characterized by cognitive fluctuations) - Trial of Aricept 5 mg po qhs.".  ST services will defer any further evlauations until formal assessment and dx by Neurology in order to generate an appropriate POC to address his needs/abilities. CM updated.     Orinda Kenner, MS, CCC-SLP Speech Language Pathologist Rehab Services [phone] [facility] 11/13/2020, 4:02 PM

## 2020-11-13 NOTE — Plan of Care (Signed)
  Problem: Education: Goal: Knowledge of General Education information will improve Description: Including pain rating scale, medication(s)/side effects and non-pharmacologic comfort measures Outcome: Progressing   Problem: Clinical Measurements: Goal: Ability to maintain clinical measurements within normal limits will improve Outcome: Progressing Goal: Will remain free from infection Outcome: Progressing Goal: Diagnostic test results will improve Outcome: Progressing Goal: Respiratory complications will improve Outcome: Progressing Goal: Cardiovascular complication will be avoided Outcome: Progressing   Problem: Nutrition: Goal: Adequate nutrition will be maintained Outcome: Progressing   Problem: Coping: Goal: Level of anxiety will decrease Outcome: Progressing   Problem: Elimination: Goal: Will not experience complications related to bowel motility Outcome: Progressing Goal: Will not experience complications related to urinary retention Outcome: Progressing   Problem: Pain Managment: Goal: General experience of comfort will improve Outcome: Progressing   Problem: Safety: Goal: Ability to remain free from injury will improve Outcome: Progressing   Problem: Skin Integrity: Goal: Risk for impaired skin integrity will decrease Outcome: Progressing   Problem: Education: Goal: Knowledge of disease or condition will improve Outcome: Progressing Goal: Knowledge of secondary prevention will improve (SELECT ALL) Outcome: Progressing Goal: Knowledge of patient specific risk factors will improve (INDIVIDUALIZE FOR PATIENT) Outcome: Progressing   Problem: Education: Goal: Knowledge of secondary prevention will improve (SELECT ALL) Outcome: Progressing   Problem: Education: Goal: Knowledge of patient specific risk factors will improve (INDIVIDUALIZE FOR PATIENT) Outcome: Progressing

## 2020-11-14 DIAGNOSIS — G9341 Metabolic encephalopathy: Secondary | ICD-10-CM | POA: Diagnosis not present

## 2020-11-14 LAB — RESP PANEL BY RT-PCR (FLU A&B, COVID) ARPGX2
Influenza A by PCR: POSITIVE — AB
Influenza B by PCR: NEGATIVE
SARS Coronavirus 2 by RT PCR: NEGATIVE

## 2020-11-14 LAB — GLUCOSE, CAPILLARY
Glucose-Capillary: 164 mg/dL — ABNORMAL HIGH (ref 70–99)
Glucose-Capillary: 289 mg/dL — ABNORMAL HIGH (ref 70–99)
Glucose-Capillary: 175 mg/dL — ABNORMAL HIGH (ref 70–99)
Glucose-Capillary: 182 mg/dL — ABNORMAL HIGH (ref 70–99)

## 2020-11-14 MED ORDER — SENNOSIDES-DOCUSATE SODIUM 8.6-50 MG PO TABS
1.0000 | ORAL_TABLET | Freq: Once | ORAL | Status: AC
  Administered 2020-11-14: 1 via ORAL
  Filled 2020-11-14: qty 1

## 2020-11-14 NOTE — Progress Notes (Signed)
PROGRESS NOTE    Steve Ramirez  ZOX:[id number] DOB: April 08, 1935 DOA: 11/09/2020 PCP: Sharilyn Sites, MD   Brief Narrative:  This 85 yrs old  male with medical history significant for multiple strokes, recently discharged from the hospital on 11/05/2020 for TIA, hypertension, GI bleed, type 2 diabetes mellitus, hyperlipidemia, history of melanoma, who was brought to the hospital because of altered mental status.  He initially presented to the emergency room on 11/08/2020 with slurred speech.  CT head did not show any acute abnormality and was discharged back to the assisted living facility.  On 11/09/2020 around 5 PM, he was found slumped in his chair and was brought back to the hospital.   He is admitted to the hospital for acute confusional state. Etiology of change in mental status is not clear.  MRI brain with and without contrast did not show any acute abnormality.  He also had hypertensive urgency which was treated with  antihypertensives.  Assessment & Plan:   Principal Problem:   Acute metabolic encephalopathy Active Problems:   Diabetes mellitus without complication (HCC)   History of TIAs   Essential hypertension   Hypothyroidism   CAD (coronary artery disease)   Hypertensive urgency   AMS (altered mental status)   Pressure injury of skin  Acute confusional state likely underlying dementia: > Improving Etiology is unclear.  MRI brain with and without contrast did not show any acute abnormality.   His son, Steve Ramirez, reports he has noticed gradual change in mental status/confusion about 6 weeks prior to admission. Neurologist recommended Aricept nightly and outpatient work-up for dementia.   He also recommended repeat EEG if mental status does not improve by Wednesday, 11/13/2020. However, mental status seems to be improving.   IV fluids discontinued.  Patient still appears confused.  Repeat EEG 11/2. EEG shows significant improvement related to the prior EEG on  10/31. Neurology signed off.  CAD, history of multiple TIAs and stroke: Continue aspirin, Plavix and pravastatin as able.     Hypertensive urgency: Blood pressure is improving  Continue antihypertensives.  Chronic diastolic CHF; compensated. 2D echo on 10/24/2020 showed normal EF and grade 2 diastolic dysfunction. Continue antihypertensive medications.  Type 2 diabetes:  Hemoglobin A1c 6.5.  Continue regular insulin sliding scale.  Possible discharge to SNF tomorrow if mental status continues to improve.   DVT prophylaxis: Lovenox Code Status: Full code. Family Communication: No family at bed side. Disposition Plan:   Status is: Inpatient  Remains inpatient appropriate because: Acute confusional state.  Requiring work-up.  Anticipated discharge back to SNF in 1 to 2 days.  Consultants:  Neurology  Procedures: MRI, EEG Antimicrobials:   Anti-infectives (From admission, onward)    None        Subjective: Patient was seen and examined at bedside.  Overnight events noted. Patient still appears confused.  Daughter-in-law at bedside states he is not at his baseline. Repeat EEG shows improvement as compared to prior EEG.  Objective: Vitals:   11/14/20 0436 11/14/20 0804 11/14/20 0827 11/14/20 1111  BP: (!) 163/82 (!) 191/83 (!) 190/91 (!) 153/66  Pulse: 78 94  88  Resp: 18 18  18   Temp: 98.6 F (37 C) 97.8 F (36.6 C)  98.7 F (37.1 C)  TempSrc:      SpO2: 95% 94%  92%  Weight:      Height:        Intake/Output Summary (Last 24 hours) at 11/14/2020 1444 Last data filed at 11/14/2020 0600 Gross per  24 hour  Intake --  Output 680 ml  Net -680 ml   Filed Weights   11/09/20 1955  Weight: 81.6 kg    Examination:  General exam: Appears comfortable, NAD, deconditioned. Respiratory system: Clear to auscultation bilaterally. Respiratory effort normal. RR 12. Cardiovascular system: S1-S2 heard, regular rate and rhythm, no murmur.   Gastrointestinal system:  Abdomen is soft, nontender, nondistended, BS+ Central nervous system: Alert and oriented x 1. No focal neurological deficits. Extremities: No edema, no cyanosis, no clubbing. Skin: No rashes, lesions or ulcers Psychiatry: Judgement and insight appear normal. Mood & affect appropriate.     Data Reviewed: I have personally reviewed following labs and imaging studies  CBC: Recent Labs  Lab 11/08/20 1124 11/09/20 2003 11/12/20 0618  WBC 5.9 5.1  6.5 4.6  NEUTROABS 3.4 3.5 3.3  HGB 12.5* 12.9*  12.9* 13.3  HCT 40.8 40.2  42.1 42.0  MCV 72.6* 71.3*  73.0* 70.5*  PLT 287 247  253 672   Basic Metabolic Panel: Recent Labs  Lab 11/08/20 1124 11/09/20 2003 11/12/20 0618  NA 139 134* 136  K 4.5 5.0 3.6  CL 109 105 107  CO2 24 19* 19*  GLUCOSE 120* 152* 141*  BUN 20 16 18   CREATININE 0.75 0.80 0.73  CALCIUM 9.7 9.3 8.8*   GFR: Estimated Creatinine Clearance: 67.7 mL/min (by C-G formula based on SCr of 0.73 mg/dL). Liver Function Tests: Recent Labs  Lab 11/08/20 1124  AST 17  ALT 18  ALKPHOS 61  BILITOT 0.8  PROT 7.5  ALBUMIN 4.6   No results for input(s): LIPASE, AMYLASE in the last 168 hours. Recent Labs  Lab 11/12/20 0618  AMMONIA 19   Coagulation Profile: Recent Labs  Lab 11/08/20 1124 11/09/20 2003  INR 1.1 1.1   Cardiac Enzymes: No results for input(s): CKTOTAL, CKMB, CKMBINDEX, TROPONINI in the last 168 hours. BNP (last 3 results) No results for input(s): PROBNP in the last 8760 hours. HbA1C: No results for input(s): HGBA1C in the last 72 hours. CBG: Recent Labs  Lab 11/13/20 1735 11/13/20 2017 11/13/20 2304 11/14/20 0803 11/14/20 1111  GLUCAP 130* 223* 184* 164* 289*   Lipid Profile: No results for input(s): CHOL, HDL, LDLCALC, TRIG, CHOLHDL, LDLDIRECT in the last 72 hours. Thyroid Function Tests: No results for input(s): TSH, T4TOTAL, FREET4, T3FREE, THYROIDAB in the last 72 hours. Anemia Panel: No results for input(s): VITAMINB12,  FOLATE, FERRITIN, TIBC, IRON, RETICCTPCT in the last 72 hours. Sepsis Labs: No results for input(s): PROCALCITON, LATICACIDVEN in the last 168 hours.  Recent Results (from the past 240 hour(s))  Resp Panel by RT-PCR (Flu A&B, Covid) Nasopharyngeal Swab     Status: None   Collection Time: 11/08/20 11:24 AM   Specimen: Nasopharyngeal Swab; Nasopharyngeal(NP) swabs in vial transport medium  Result Value Ref Range Status   SARS Coronavirus 2 by RT PCR NEGATIVE NEGATIVE Final    Comment: (NOTE) SARS-CoV-2 target nucleic acids are NOT DETECTED.  The SARS-CoV-2 RNA is generally detectable in upper respiratory specimens during the acute phase of infection. The lowest concentration of SARS-CoV-2 viral copies this assay can detect is 138 copies/mL. A negative result does not preclude SARS-Cov-2 infection and should not be used as the sole basis for treatment or other patient management decisions. A negative result may occur with  improper specimen collection/handling, submission of specimen other than nasopharyngeal swab, presence of viral mutation(s) within the areas targeted by this assay, and inadequate number of viral copies(<138 copies/mL). A  negative result must be combined with clinical observations, patient history, and epidemiological information. The expected result is Negative.  Fact Sheet for Patients:  EntrepreneurPulse.com.au  Fact Sheet for Healthcare Providers:  IncredibleEmployment.be  This test is no t yet approved or cleared by the [place] FDA and  has been authorized for detection and/or diagnosis of SARS-CoV-2 by FDA under an Emergency Use Authorization (EUA). This EUA will remain  in effect (meaning this test can be used) for the duration of the COVID-19 declaration under Section 564(b)(1) of the Act, 21 U.S.C.section 360bbb-3(b)(1), unless the authorization is terminated  or revoked sooner.       Influenza A by PCR  NEGATIVE NEGATIVE Final   Influenza B by PCR NEGATIVE NEGATIVE Final    Comment: (NOTE) The Xpert Xpress SARS-CoV-2/FLU/RSV plus assay is intended as an aid in the diagnosis of influenza from Nasopharyngeal swab specimens and should not be used as a sole basis for treatment. Nasal washings and aspirates are unacceptable for Xpert Xpress SARS-CoV-2/FLU/RSV testing.  Fact Sheet for Patients: EntrepreneurPulse.com.au  Fact Sheet for Healthcare Providers: IncredibleEmployment.be  This test is not yet approved or cleared by the [place] FDA and has been authorized for detection and/or diagnosis of SARS-CoV-2 by FDA under an Emergency Use Authorization (EUA). This EUA will remain in effect (meaning this test can be used) for the duration of the COVID-19 declaration under Section 564(b)(1) of the Act, 21 U.S.C. section 360bbb-3(b)(1), unless the authorization is terminated or revoked.  Performed at [facility], [address] [place], [place] [address]   Resp Panel by RT-PCR (Flu A&B, Covid) Nasopharyngeal Swab     Status: None   Collection Time: 11/09/20  9:14 PM   Specimen: Nasopharyngeal Swab; Nasopharyngeal(NP) swabs in vial transport medium  Result Value Ref Range Status   SARS Coronavirus 2 by RT PCR NEGATIVE NEGATIVE Final    Comment: (NOTE) SARS-CoV-2 target nucleic acids are NOT DETECTED.  The SARS-CoV-2 RNA is generally detectable in upper respiratory specimens during the acute phase of infection. The lowest concentration of SARS-CoV-2 viral copies this assay can detect is 138 copies/mL. A negative result does not preclude SARS-Cov-2 infection and should not be used as the sole basis for treatment or other patient management decisions. A negative result may occur with  improper specimen collection/handling, submission of specimen other than nasopharyngeal swab, presence of viral mutation(s) within the areas targeted by  this assay, and inadequate number of viral copies(<138 copies/mL). A negative result must be combined with clinical observations, patient history, and epidemiological information. The expected result is Negative.  Fact Sheet for Patients:  EntrepreneurPulse.com.au  Fact Sheet for Healthcare Providers:  IncredibleEmployment.be  This test is no t yet approved or cleared by the [place] FDA and  has been authorized for detection and/or diagnosis of SARS-CoV-2 by FDA under an Emergency Use Authorization (EUA). This EUA will remain  in effect (meaning this test can be used) for the duration of the COVID-19 declaration under Section 564(b)(1) of the Act, 21 U.S.C.section 360bbb-3(b)(1), unless the authorization is terminated  or revoked sooner.       Influenza A by PCR NEGATIVE NEGATIVE Final   Influenza B by PCR NEGATIVE NEGATIVE Final    Comment: (NOTE) The Xpert Xpress SARS-CoV-2/FLU/RSV plus assay is intended as an aid in the diagnosis of influenza from Nasopharyngeal swab specimens and should not be used as a sole basis for treatment. Nasal washings and aspirates are unacceptable for Xpert Xpress SARS-CoV-2/FLU/RSV testing.  Fact Sheet for Patients: EntrepreneurPulse.com.au  Fact Sheet for Healthcare Providers: IncredibleEmployment.be  This test is not yet approved or cleared by the [place] FDA and has been authorized for detection and/or diagnosis of SARS-CoV-2 by FDA under an Emergency Use Authorization (EUA). This EUA will remain in effect (meaning this test can be used) for the duration of the COVID-19 declaration under Section 564(b)(1) of the Act, 21 U.S.C. section 360bbb-3(b)(1), unless the authorization is terminated or revoked.  Performed at [facility], [address] [place],  [address]   MRSA Next Gen by PCR, Nasal     Status: None   Collection Time: 11/12/20   7:40 PM   Specimen: Nasal Mucosa; Nasal Swab  Result Value Ref Range Status   MRSA by PCR Next Gen NOT DETECTED NOT DETECTED Final    Comment: (NOTE) The GeneXpert MRSA Assay (FDA approved for NASAL specimens only), is one component of a comprehensive MRSA colonization surveillance program. It is not intended to diagnose MRSA infection nor to guide or monitor treatment for MRSA infections. Test performance is not FDA approved in patients less than 39 years old. Performed at [facility], [address], [place],  [address]     Radiology Studies: EEG adult  Result Date: Nov 28, 2020 Derek Jack, MD     11-28-2020  7:18 PM Routine EEG Report YVONNE PETITE is a 85 y.o. male with a history of spells who is undergoing an EEG to evaluate for seizures. Report: This EEG was acquired with electrodes placed according to the International 10-20 electrode system (including Fp1, Fp2, F3, F4, C3, C4, P3, P4, O1, O2, T3, T4, T5, T6, A1, A2, Fz, Cz, Pz). The following electrodes were missing or displaced: none. The occipital dominant rhythm was 8.5 Hz. This activity is reactive to stimulation. Drowsiness was manifested by background fragmentation; deeper stages of sleep were not identified. There was no focal slowing. There were no interictal epileptiform discharges. There were no electrographic seizures identified. There was no abnormal response to photic stimulation or hyperventilation. Impression: This EEG was obtained while awake and drowsy and is normal.   Clinical Correlation: Normal EEGs, however, do not rule out epilepsy. Su Monks, MD [facility] [phone] If 7pm- 7am, please page neurology on call as listed in [place].    Scheduled Meds:   stroke: mapping our early stages of recovery book   Does not apply Once   amLODipine  10 mg Oral Daily   aspirin EC  81 mg Oral BID   cloNIDine  0.3 mg Transdermal Q Mon   clopidogrel  75 mg Oral Daily   enoxaparin  (LOVENOX) injection  40 mg Subcutaneous Q24H   ferrous sulfate  325 mg Oral Q breakfast   insulin aspart  0-15 Units Subcutaneous TID WC   insulin aspart  0-5 Units Subcutaneous QHS   ipratropium-albuterol  3 mL Nebulization TID   isosorbide mononitrate  30 mg Oral QPM   levothyroxine  50 mcg Oral Q0600   lisinopril  40 mg Oral QHS   loratadine  10 mg Oral Daily   metoprolol succinate  25 mg Oral QPM   pravastatin  20 mg Oral QHS   rOPINIRole  1 mg Oral QHS   senna-docusate  1 tablet Oral Once   spironolactone  25 mg Oral QHS   vitamin B-12  1,000 mcg Oral Daily   Continuous Infusions:   LOS: 3 days    Time spent: 25 mins   Wynette Jersey  Dwyane Dee, MD [facility]   If 7PM-7AM, please contact night-coverage

## 2020-11-14 NOTE — Care Management Important Message (Signed)
Important Message  Patient Details  Name: Steve Ramirez MRN: [id number] Date of Birth: 11/30/1935   Medicare Important Message Given:  Yes     Dannette Barbara 11/14/2020, 4:11 PM

## 2020-11-14 NOTE — Progress Notes (Signed)
Physical Therapy Treatment Patient Details Name: DAHL HIGINBOTHAM MRN: [id number] DOB: November 11, 1935 Today's Date: 11/14/2020   History of Present Illness Pt is an 85 y/o M admitted on 11/09/20 after presenting with c/o AMS. Pt was recently hospitalized from 10/23-10/25 with TIA presenting with AMS with negative EEG & MRI. Pt returned to ED on 10/28 with c/o slurred speech that resolved upon arrival; head CT was negative & he was sent back to ALF. Pt was later found slumped over in his chair & stopped talking so was brought back to ED. He showed no focalization of symptoms & no seizure activity. MRI was negative for acute changes. PMH: HTN, DM, multiple CVAs & TIAs, melanoma    PT Comments    Treatment focused on improving overall safety with functional mobility and balance. Pt tolerated treatment fair today, but was overall limited secondary to fatigue. Pt required increased assist of mod-max for bed mobility, transfers, and limited gait at bedside with RW. Pt presents with heavy L posterolateral lean today in both sitting and standing. Impaired proximal stability noted as pt was unable to achieve/maintain midline with dynamic sitting balance and static standing balance, and required wide BOS with flexed hips/knees in standing for balance. Possibly presenting with impaired proprioception as pt was unable to tell when he was leaning outside BOS. 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 Max multimodal cues required for sequencing and RW management with gait. Increased assist levels and deficits noted with fatigue. Pt continues to be limited with meeting goals secondary to impaired processing, decreased activity tolerance, poor balance, and impaired motor control/grading. Pt will continue to benefit from skilled acute PT services to address deficits for return to baseline function. Will continue to recommend SNF at DC.      Recommendations for follow up therapy are one component of a multi-disciplinary discharge planning process, led by the attending physician.  Recommendations may be updated based on patient status, additional functional criteria and insurance authorization.  Follow Up Recommendations  Skilled nursing-short term rehab (<3 hours/day)     Assistance Recommended at Discharge Intermittent Supervision/Assistance  Equipment Recommendations  None recommended by PT    Recommendations for Other Services       Precautions / Restrictions Precautions Precautions: Fall Restrictions Weight Bearing Restrictions: No     Mobility  Bed Mobility Overal bed mobility: Needs Assistance Bed Mobility: Supine to Sit     Supine to sit: Max assist;HOB elevated     General bed mobility comments: max for trunk facilitation to sit EOB with HOB elevated; use of BUE for support on bedrail    Transfers Overall transfer level: Needs assistance Equipment used: Rolling walker (2 wheels) Transfers: Sit to/from Stand Sit to Stand: Mod assist;Max assist           General transfer comment: Initially max assist from elevated bed height, progressing to mod assist from recliner. Max multimodal cues for set up, sequencing, and safety. Heavy L posterolateral lean in standing, with hip/knee flexion and wide BOS    Ambulation/Gait Ambulation/Gait assistance: Mod assist;Max assist Gait Distance (Feet): 3 Feet Assistive device: Rolling walker (2 wheels)   Gait velocity: decreased   General Gait Details: initially mod progressing to max assist to ambulate ~35ft from EOB>recliner with RW. Required max multimodal cues for safety, sequencing, RW management, and balance due to heavy L posterolateral lean. Demonstrates decreased step length/foot clearance bil, impaired motor control/grading, and downward gaze.  Balance     Sitting balance-Leahy Scale: Poor Sitting balance - Comments: L posterolateral lean;  cues for achieving midline     Standing balance-Leahy Scale: Zero Standing balance comment: Required mod-max assist for standing balance in RW; cues for achieving midline                            Cognition Arousal/Alertness: Awake/alert;Lethargic Behavior During Therapy: Flat affect Overall Cognitive Status: History of cognitive impairments - at baseline Area of Impairment: Orientation;Memory;Following commands;Awareness;Safety/judgement;Problem solving;Attention                 Orientation Level: Disoriented to;Place;Time;Situation Current Attention Level: Focused Memory: Decreased short-term memory Following Commands: Follows one step commands inconsistently Safety/Judgement: Decreased awareness of deficits;Decreased awareness of safety Awareness: Intellectual Problem Solving: Slow processing;Requires tactile cues;Requires verbal cues;Difficulty sequencing;Decreased initiation General Comments: Pt a&O x3 (person, place, month, and situation); needed cues for year. slowed processing, multimodal cues for safety        Exercises Other Exercises Other Exercises: Participated in bed mobility, transfers, and minimal gait from EOB>recliner. Required mod-max assist for all mobility due to impaired processing and heavy L posterolateral lean. Worked on achieving midline in sitting (with BLE therex) and standing (with RW and lateral weight shifts). Required max multimodal cues. Other Exercises: Pt educated re: PT role/POC, DC recommendations, safety with mobility, balance/midline. He verbalized understanding.    General Comments General comments (skin integrity, edema, etc.): wheezing throughout session on RA, despite pt without c/o SOB/DOE      Pertinent Vitals/Pain Pain Assessment: No/denies pain     PT Goals (current goals can now be found in the care plan section) Acute Rehab PT Goals Patient Stated Goal: none stated PT Goal Formulation: Patient unable to  participate in goal setting Time For Goal Achievement: 11/24/20 Potential to Achieve Goals: Fair Progress towards PT goals: Not progressing toward goals - comment (has regressed since last PT session)    Frequency    Min 2X/week      PT Plan Current plan remains appropriate       AM-PAC PT "6 Clicks" Mobility   Outcome Measure  Help needed turning from your back to your side while in a flat bed without using bedrails?: A Lot Help needed moving from lying on your back to sitting on the side of a flat bed without using bedrails?: A Lot Help needed moving to and from a bed to a chair (including a wheelchair)?: A Lot Help needed standing up from a chair using your arms (e.g., wheelchair or bedside chair)?: A Lot Help needed to walk in hospital room?: A Lot Help needed climbing 3-5 steps with a railing? : A Lot 6 Click Score: 12    End of Session Equipment Utilized During Treatment: Gait belt Activity Tolerance: Patient limited by lethargy Patient left: in chair;with call bell/phone within reach;with chair alarm set Nurse Communication: Mobility status PT Visit Diagnosis: Unsteadiness on feet (R26.81);Muscle weakness (generalized) (M62.81);History of falling (Z91.81);Difficulty in walking, not elsewhere classified (R26.2)     Time: 1610-9604 PT Time Calculation (min) (ACUTE ONLY): 27 min  Charges:  $Therapeutic Activity: 8-22 mins $Neuromuscular Re-education: 8-22 mins                     Herminio Commons, PT, DPT 3:18 PM,11/14/20

## 2020-11-14 NOTE — TOC Progression Note (Addendum)
Transition of Care [facility]) - Progression Note    Patient Details  Name: Steve Ramirez MRN: [id number] Date of Birth: December 13, 1935  Transition of Care [facility]) CM/SW [place], [place] Phone Number: 11/14/2020, 2:21 PM  Clinical Narrative:     Update: [facility] accepted, insurance Josem Kaufmann has been started.   CSW spoke with patient's daughter [name] who reports preference for [facility] as patient's wife was previously there for SNF.   CSW has reached out to admissions at [facility] who are reviewing patient's chart to see if they can accept. Pending acceptance at this time.   Expected Discharge Plan: [place] Barriers to Discharge: Continued Medical Work up  Expected Discharge Plan and Services Expected Discharge Plan: [place] arrangements for the past 2 months: [facility] [employer] transport manager)                                       Social Determinants of Health (SDOH) Interventions    Readmission Risk Interventions No flowsheet data found.

## 2020-11-14 NOTE — Progress Notes (Signed)
EEG #2 performed yesterday was normal. This represents significant improvement relative to the prior EEG on 10/31, which showed GPEDs.   No new neurological recommendations.   Given his improvement, Neurology will sign off. Please call if there are additional questions.   Electronically signed: Dr. Kerney Elbe

## 2020-11-15 DIAGNOSIS — G9341 Metabolic encephalopathy: Secondary | ICD-10-CM | POA: Diagnosis not present

## 2020-11-15 LAB — GLUCOSE, CAPILLARY
Glucose-Capillary: 149 mg/dL — ABNORMAL HIGH (ref 70–99)
Glucose-Capillary: 232 mg/dL — ABNORMAL HIGH (ref 70–99)
Glucose-Capillary: 208 mg/dL — ABNORMAL HIGH (ref 70–99)
Glucose-Capillary: 134 mg/dL — ABNORMAL HIGH (ref 70–99)

## 2020-11-15 LAB — BASIC METABOLIC PANEL
Anion gap: 10 (ref 5–15)
BUN: 19 mg/dL (ref 8–23)
CO2: 24 mmol/L (ref 22–32)
Calcium: 8.7 mg/dL — ABNORMAL LOW (ref 8.9–10.3)
Chloride: 105 mmol/L (ref 98–111)
Creatinine, Ser: 0.58 mg/dL — ABNORMAL LOW (ref 0.61–1.24)
GFR, Estimated: >60 mL/min (ref >60)
Glucose, Bld: 147 mg/dL — ABNORMAL HIGH (ref 70–99)
Potassium: 3.7 mmol/L (ref 3.5–5.1)
Sodium: 139 mmol/L (ref 135–145)

## 2020-11-15 LAB — PHOSPHORUS: Phosphorus: 3.1 mg/dL (ref 2.5–4.6)

## 2020-11-15 LAB — CBC
HCT: 39.3 % (ref 39.0–52.0)
Hemoglobin: 12 g/dL — ABNORMAL LOW (ref 13.0–17.0)
MCH: 22.2 pg — ABNORMAL LOW (ref 26.0–34.0)
MCHC: 30.5 g/dL (ref 30.0–36.0)
MCV: 72.6 fL — ABNORMAL LOW (ref 80.0–100.0)
Platelets: 230 10*3/uL (ref 150–400)
RBC: 5.41 MIL/uL (ref 4.22–5.81)
RDW: 20.2 % — ABNORMAL HIGH (ref 11.5–15.5)
WBC: 6.2 10*3/uL (ref 4.0–10.5)
nRBC: 0 % (ref 0.0–0.2)

## 2020-11-15 LAB — MAGNESIUM: Magnesium: 1.8 mg/dL (ref 1.7–2.4)

## 2020-11-15 MED ORDER — IPRATROPIUM-ALBUTEROL 0.5-2.5 (3) MG/3ML IN SOLN
3.0000 mL | RESPIRATORY_TRACT | Status: DC | PRN
  Administered 2020-11-15: 3 mL via RESPIRATORY_TRACT
  Filled 2020-11-15: qty 3

## 2020-11-15 MED ORDER — ASPIRIN EC 81 MG PO TBEC
81.0000 mg | DELAYED_RELEASE_TABLET | Freq: Every day | ORAL | Status: DC
  Administered 2020-11-15 – 2020-11-19 (×5): 81 mg via ORAL
  Filled 2020-11-15 (×5): qty 1

## 2020-11-15 MED ORDER — OSELTAMIVIR PHOSPHATE 75 MG PO CAPS
75.0000 mg | ORAL_CAPSULE | Freq: Two times a day (BID) | ORAL | Status: DC
  Administered 2020-11-15 – 2020-11-19 (×8): 75 mg via ORAL
  Filled 2020-11-15 (×10): qty 1

## 2020-11-15 NOTE — Progress Notes (Signed)
PROGRESS NOTE    Steve Ramirez  VFI:[id number] DOB: January 14, 1935 DOA: 11/09/2020 PCP: Sharilyn Sites, MD   Brief Narrative:  This 85 yrs old  male with medical history significant for multiple strokes, recently discharged from the hospital on 11/05/2020 for TIA, hypertension, GI bleed, type 2 diabetes mellitus, hyperlipidemia, history of melanoma, who was brought to the hospital because of altered mental status.  He initially presented to the emergency room on 11/08/2020 with slurred speech.  CT head did not show any acute abnormality and was discharged back to the assisted living facility.  On 11/09/2020 around 5 PM, he was found slumped in his chair and was brought back to the hospital.   He is admitted to the hospital for acute confusional state. Etiology of change in mental status is not clear.  MRI brain with and without contrast did not show any acute abnormality.  He also had hypertensive urgency which was treated with  antihypertensives.  Patient is medically clear awaiting insurance authorization.  11/4: Insurance authorization approved.  Unfortunately while checking COVID for SNF requirement,  came out influenza positive.  Patient is started on Tamiflu.  Patient has to stay here until 11/8 as per SNF protocol.  Assessment & Plan:   Principal Problem:   Acute metabolic encephalopathy Active Problems:   Diabetes mellitus without complication (HCC)   History of TIAs   Essential hypertension   Hypothyroidism   CAD (coronary artery disease)   Hypertensive urgency   AMS (altered mental status)   Pressure injury of skin  Acute confusional state likely underlying dementia: > Improved. Etiology is unclear.  MRI brain with and without contrast did not show any acute abnormality.   His son, [name], reports he has noticed gradual change in mental status/confusion about 6 weeks prior to admission. Neurologist recommended Aricept nightly and outpatient work-up for dementia.   He also  recommended repeat EEG if mental status does not improve by Wednesday, 11/13/2020. Patient still appeared confused.  Repeat EEG 11/2. EEG shows significant improvement related to the prior EEG on 10/31. Neurology signed off. AMS improved.  CAD, history of multiple TIAs and stroke: Continue aspirin, Plavix and pravastatin as able.     Hypertensive urgency: Blood pressure is improving  Continue antihypertensives.  Chronic diastolic CHF; compensated. 2D echo on 10/24/2020 showed normal EF and grade 2 diastolic dysfunction. Continue antihypertensive medications.  Type 2 diabetes:  Hemoglobin A1c 6.5.  Continue regular insulin sliding scale.  Anticipated discharge to SNF on 11/8.  Patient influenza +, has to wait until 11/8 to be discharged.   DVT prophylaxis: Lovenox Code Status: Full code. Family Communication: No family at bed side. Disposition Plan:   Status is: Inpatient  Remains inpatient appropriate because: Acute confusional state.  Requiring work-up.  Anticipated discharge back to SNF on 11/19/20  Consultants:  Neurology  Procedures: MRI, EEG Antimicrobials:   Anti-infectives (From admission, onward)    Start     Dose/Rate Route Frequency Ordered Stop   11/15/20 1000  oseltamivir (TAMIFLU) capsule 75 mg        75 mg Oral 2 times daily 11/15/20 0752 11/20/20 0959        Subjective: Patient was seen and examined at bedside.  Overnight events noted. Daughter-in-law at bedside states he is back to his baseline mental status.   Unfortunately influenza positive, has to wait until 11/8 to be discharged.   Objective: Vitals:   11/15/20 0351 11/15/20 0450 11/15/20 0832 11/15/20 1216  BP: 134/71  Marland Kitchen)  175/74 (!) 171/75  Pulse: 77  78 65  Resp: 18  17 17   Temp: 97.7 F (36.5 C)  98.8 F (37.1 C) 98.5 F (36.9 C)  TempSrc: Oral     SpO2: 95% 95% 96% 99%  Weight:      Height:        Intake/Output Summary (Last 24 hours) at 11/15/2020 1346 Last data filed at  11/15/2020 0400 Gross per 24 hour  Intake 120 ml  Output 750 ml  Net -630 ml   Filed Weights   11/09/20 1955  Weight: 81.6 kg    Examination:  General exam: Appears comfortable, NAD, deconditioned. Respiratory system: Clear to auscultation bilaterally. Respiratory effort normal. RR 12. Cardiovascular system: S1-S2 heard, regular rate and rhythm, no murmur.   Gastrointestinal system: Abdomen is soft, nontender, nondistended, BS+ Central nervous system: Alert and oriented x 2. No focal neurological deficits. Extremities: No edema, no cyanosis, no clubbing. Skin: No rashes, lesions or ulcers Psychiatry: Judgement and insight appear normal. Mood & affect appropriate.     Data Reviewed: I have personally reviewed following labs and imaging studies  CBC: Recent Labs  Lab 11/09/20 2003 11/12/20 0618 11/15/20 0546  WBC 5.1  6.5 4.6 6.2  NEUTROABS 3.5 3.3  --   HGB 12.9*  12.9* 13.3 12.0*  HCT 40.2  42.1 42.0 39.3  MCV 71.3*  73.0* 70.5* 72.6*  PLT 247  253 217 431   Basic Metabolic Panel: Recent Labs  Lab 11/09/20 2003 11/12/20 0618 11/15/20 0546  NA 134* 136 139  K 5.0 3.6 3.7  CL 105 107 105  CO2 19* 19* 24  GLUCOSE 152* 141* 147*  BUN 16 18 19   CREATININE 0.80 0.73 0.58*  CALCIUM 9.3 8.8* 8.7*  MG  --   --  1.8  PHOS  --   --  3.1   GFR: Estimated Creatinine Clearance: 67.7 mL/min (A) (by C-G formula based on SCr of 0.58 mg/dL (L)). Liver Function Tests: No results for input(s): AST, ALT, ALKPHOS, BILITOT, PROT, ALBUMIN in the last 168 hours.  No results for input(s): LIPASE, AMYLASE in the last 168 hours. Recent Labs  Lab 11/12/20 0618  AMMONIA 19   Coagulation Profile: Recent Labs  Lab 11/09/20 2003  INR 1.1   Cardiac Enzymes: No results for input(s): CKTOTAL, CKMB, CKMBINDEX, TROPONINI in the last 168 hours. BNP (last 3 results) No results for input(s): PROBNP in the last 8760 hours. HbA1C: No results for input(s): HGBA1C in the last 72  hours. CBG: Recent Labs  Lab 11/14/20 1111 11/14/20 1614 11/14/20 2049 11/15/20 0833 11/15/20 1219  GLUCAP 289* 175* 182* 149* 232*   Lipid Profile: No results for input(s): CHOL, HDL, LDLCALC, TRIG, CHOLHDL, LDLDIRECT in the last 72 hours. Thyroid Function Tests: No results for input(s): TSH, T4TOTAL, FREET4, T3FREE, THYROIDAB in the last 72 hours. Anemia Panel: No results for input(s): VITAMINB12, FOLATE, FERRITIN, TIBC, IRON, RETICCTPCT in the last 72 hours. Sepsis Labs: No results for input(s): PROCALCITON, LATICACIDVEN in the last 168 hours.  Recent Results (from the past 240 hour(s))  Resp Panel by RT-PCR (Flu A&B, Covid) Nasopharyngeal Swab     Status: None   Collection Time: 11/08/20 11:24 AM   Specimen: Nasopharyngeal Swab; Nasopharyngeal(NP) swabs in vial transport medium  Result Value Ref Range Status   SARS Coronavirus 2 by RT PCR NEGATIVE NEGATIVE Final    Comment: (NOTE) SARS-CoV-2 target nucleic acids are NOT DETECTED.  The SARS-CoV-2 RNA is generally detectable in  upper respiratory specimens during the acute phase of infection. The lowest concentration of SARS-CoV-2 viral copies this assay can detect is 138 copies/mL. A negative result does not preclude SARS-Cov-2 infection and should not be used as the sole basis for treatment or other patient management decisions. A negative result may occur with  improper specimen collection/handling, submission of specimen other than nasopharyngeal swab, presence of viral mutation(s) within the areas targeted by this assay, and inadequate number of viral copies(<138 copies/mL). A negative result must be combined with clinical observations, patient history, and epidemiological information. The expected result is Negative.  Fact Sheet for Patients:  EntrepreneurPulse.com.au  Fact Sheet for Healthcare Providers:  IncredibleEmployment.be  This test is no t yet approved or cleared by  the [place] FDA and  has been authorized for detection and/or diagnosis of SARS-CoV-2 by FDA under an Emergency Use Authorization (EUA). This EUA will remain  in effect (meaning this test can be used) for the duration of the COVID-19 declaration under Section 564(b)(1) of the Act, 21 U.S.C.section 360bbb-3(b)(1), unless the authorization is terminated  or revoked sooner.       Influenza A by PCR NEGATIVE NEGATIVE Final   Influenza B by PCR NEGATIVE NEGATIVE Final    Comment: (NOTE) The Xpert Xpress SARS-CoV-2/FLU/RSV plus assay is intended as an aid in the diagnosis of influenza from Nasopharyngeal swab specimens and should not be used as a sole basis for treatment. Nasal washings and aspirates are unacceptable for Xpert Xpress SARS-CoV-2/FLU/RSV testing.  Fact Sheet for Patients: EntrepreneurPulse.com.au  Fact Sheet for Healthcare Providers: IncredibleEmployment.be  This test is not yet approved or cleared by the [place] FDA and has been authorized for detection and/or diagnosis of SARS-CoV-2 by FDA under an Emergency Use Authorization (EUA). This EUA will remain in effect (meaning this test can be used) for the duration of the COVID-19 declaration under Section 564(b)(1) of the Act, 21 U.S.C. section 360bbb-3(b)(1), unless the authorization is terminated or revoked.  Performed at [facility], [address] [place], [place] [address]   Resp Panel by RT-PCR (Flu A&B, Covid) Nasopharyngeal Swab     Status: None   Collection Time: 11/09/20  9:14 PM   Specimen: Nasopharyngeal Swab; Nasopharyngeal(NP) swabs in vial transport medium  Result Value Ref Range Status   SARS Coronavirus 2 by RT PCR NEGATIVE NEGATIVE Final    Comment: (NOTE) SARS-CoV-2 target nucleic acids are NOT DETECTED.  The SARS-CoV-2 RNA is generally detectable in upper respiratory specimens during the acute phase of infection. The  lowest concentration of SARS-CoV-2 viral copies this assay can detect is 138 copies/mL. A negative result does not preclude SARS-Cov-2 infection and should not be used as the sole basis for treatment or other patient management decisions. A negative result may occur with  improper specimen collection/handling, submission of specimen other than nasopharyngeal swab, presence of viral mutation(s) within the areas targeted by this assay, and inadequate number of viral copies(<138 copies/mL). A negative result must be combined with clinical observations, patient history, and epidemiological information. The expected result is Negative.  Fact Sheet for Patients:  EntrepreneurPulse.com.au  Fact Sheet for Healthcare Providers:  IncredibleEmployment.be  This test is no t yet approved or cleared by the [place] FDA and  has been authorized for detection and/or diagnosis of SARS-CoV-2 by FDA under an Emergency Use Authorization (EUA). This EUA will remain  in effect (meaning this test can be used) for the duration of the COVID-19 declaration under Section 564(b)(1) of  the Act, 21 U.S.C.section 360bbb-3(b)(1), unless the authorization is terminated  or revoked sooner.       Influenza A by PCR NEGATIVE NEGATIVE Final   Influenza B by PCR NEGATIVE NEGATIVE Final    Comment: (NOTE) The Xpert Xpress SARS-CoV-2/FLU/RSV plus assay is intended as an aid in the diagnosis of influenza from Nasopharyngeal swab specimens and should not be used as a sole basis for treatment. Nasal washings and aspirates are unacceptable for Xpert Xpress SARS-CoV-2/FLU/RSV testing.  Fact Sheet for Patients: EntrepreneurPulse.com.au  Fact Sheet for Healthcare Providers: IncredibleEmployment.be  This test is not yet approved or cleared by the [place] FDA and has been authorized for detection and/or diagnosis of SARS-CoV-2 by FDA under  an Emergency Use Authorization (EUA). This EUA will remain in effect (meaning this test can be used) for the duration of the COVID-19 declaration under Section 564(b)(1) of the Act, 21 U.S.C. section 360bbb-3(b)(1), unless the authorization is terminated or revoked.  Performed at [facility], [address] [place], [place] [address]   MRSA Next Gen by PCR, Nasal     Status: None   Collection Time: 11/12/20  7:40 PM   Specimen: Nasal Mucosa; Nasal Swab  Result Value Ref Range Status   MRSA by PCR Next Gen NOT DETECTED NOT DETECTED Final    Comment: (NOTE) The GeneXpert MRSA Assay (FDA approved for NASAL specimens only), is one component of a comprehensive MRSA colonization surveillance program. It is not intended to diagnose MRSA infection nor to guide or monitor treatment for MRSA infections. Test performance is not FDA approved in patients less than 47 years old. Performed at [facility], [address] [place], [place] [address]   Resp Panel by RT-PCR (Flu A&B, Covid) Nasopharyngeal Swab     Status: Abnormal   Collection Time: 11/14/20  4:30 PM   Specimen: Nasopharyngeal Swab; Nasopharyngeal(NP) swabs in vial transport medium  Result Value Ref Range Status   SARS Coronavirus 2 by RT PCR NEGATIVE NEGATIVE Final    Comment: (NOTE) SARS-CoV-2 target nucleic acids are NOT DETECTED.  The SARS-CoV-2 RNA is generally detectable in upper respiratory specimens during the acute phase of infection. The lowest concentration of SARS-CoV-2 viral copies this assay can detect is 138 copies/mL. A negative result does not preclude SARS-Cov-2 infection and should not be used as the sole basis for treatment or other patient management decisions. A negative result may occur with  improper specimen collection/handling, submission of specimen other than nasopharyngeal swab, presence of viral mutation(s) within the areas targeted by this assay, and inadequate number of  viral copies(<138 copies/mL). A negative result must be combined with clinical observations, patient history, and epidemiological information. The expected result is Negative.  Fact Sheet for Patients:  EntrepreneurPulse.com.au  Fact Sheet for Healthcare Providers:  IncredibleEmployment.be  This test is no t yet approved or cleared by the [place] FDA and  has been authorized for detection and/or diagnosis of SARS-CoV-2 by FDA under an Emergency Use Authorization (EUA). This EUA will remain  in effect (meaning this test can be used) for the duration of the COVID-19 declaration under Section 564(b)(1) of the Act, 21 U.S.C.section 360bbb-3(b)(1), unless the authorization is terminated  or revoked sooner.       Influenza A by PCR POSITIVE (A) NEGATIVE Final   Influenza B by PCR NEGATIVE NEGATIVE Final    Comment: (NOTE) The Xpert Xpress SARS-CoV-2/FLU/RSV plus assay is intended as an aid in the diagnosis of influenza from Nasopharyngeal  swab specimens and should not be used as a sole basis for treatment. Nasal washings and aspirates are unacceptable for Xpert Xpress SARS-CoV-2/FLU/RSV testing.  Fact Sheet for Patients: EntrepreneurPulse.com.au  Fact Sheet for Healthcare Providers: IncredibleEmployment.be  This test is not yet approved or cleared by the [place] FDA and has been authorized for detection and/or diagnosis of SARS-CoV-2 by FDA under an Emergency Use Authorization (EUA). This EUA will remain in effect (meaning this test can be used) for the duration of the COVID-19 declaration under Section 564(b)(1) of the Act, 21 U.S.C. section 360bbb-3(b)(1), unless the authorization is terminated or revoked.  Performed at [facility], [address] [place], [place] [address]     Radiology Studies: EEG adult  Result Date: 2020-12-08 Derek Jack, MD     2020/12/08  7:18 PM  Routine EEG Report BARBARA KENG is a 85 y.o. male with a history of spells who is undergoing an EEG to evaluate for seizures. Report: This EEG was acquired with electrodes placed according to the International 10-20 electrode system (including Fp1, Fp2, F3, F4, C3, C4, P3, P4, O1, O2, T3, T4, T5, T6, A1, A2, Fz, Cz, Pz). The following electrodes were missing or displaced: none. The occipital dominant rhythm was 8.5 Hz. This activity is reactive to stimulation. Drowsiness was manifested by background fragmentation; deeper stages of sleep were not identified. There was no focal slowing. There were no interictal epileptiform discharges. There were no electrographic seizures identified. There was no abnormal response to photic stimulation or hyperventilation. Impression: This EEG was obtained while awake and drowsy and is normal.   Clinical Correlation: Normal EEGs, however, do not rule out epilepsy. Su Monks, MD [facility] [phone] If 7pm- 7am, please page neurology on call as listed in [place].    Scheduled Meds:   stroke: mapping our early stages of recovery book   Does not apply Once   amLODipine  10 mg Oral Daily   aspirin EC  81 mg Oral Daily   cloNIDine  0.3 mg Transdermal Q Mon   clopidogrel  75 mg Oral Daily   enoxaparin (LOVENOX) injection  40 mg Subcutaneous Q24H   ferrous sulfate  325 mg Oral Q breakfast   insulin aspart  0-15 Units Subcutaneous TID WC   insulin aspart  0-5 Units Subcutaneous QHS   ipratropium-albuterol  3 mL Nebulization TID   isosorbide mononitrate  30 mg Oral QPM   levothyroxine  50 mcg Oral Q0600   lisinopril  40 mg Oral QHS   loratadine  10 mg Oral Daily   metoprolol succinate  25 mg Oral QPM   oseltamivir  75 mg Oral BID   pravastatin  20 mg Oral QHS   rOPINIRole  1 mg Oral QHS   spironolactone  25 mg Oral QHS   vitamin B-12  1,000 mcg Oral Daily   Continuous Infusions:   LOS: 4 days    Time spent: 25 mins   Shawna Clamp,  MD [facility]   If 7PM-7AM, please contact night-coverage

## 2020-11-15 NOTE — Progress Notes (Signed)
Occupational Therapy Treatment Patient Details Name: Steve Ramirez MRN: [id number] DOB: 1935/09/17 Today's Date: 11/15/2020   History of present illness Pt is an 85 y/o M admitted on 11/09/20 after presenting with c/o AMS. Pt was recently hospitalized from 10/23-10/25 with TIA presenting with AMS with negative EEG & MRI. Pt returned to ED on 10/28 with c/o slurred speech that resolved upon arrival; head CT was negative & he was sent back to ALF. Pt was later found slumped over in his chair & stopped talking so was brought back to ED. He showed no focalization of symptoms & no seizure activity. MRI was negative for acute changes. PMH: HTN, DM, multiple CVAs & TIAs, melanoma   OT comments  Chart reviewed, RN cleared pt for participation in OT tx session. Pt greeted in bed, alert and oriented x4. Improved orientation and awareness noted on this date compared to previous sessions per chart review. Tx session targeted improving functional mobility in order to facilitate increased independence and safety during ADL task completion, ADL tasks via task oriented training in order to facilitate improved sequencing during ADL. Pt with progress in bed mobility, functional mobility, independent ADL task completion. Pt continues to be performing below PLOF, would continue to benefit from discharge to STR to address functional deficits. Pt left as received, NAD, all needs met. OT will continue to follow.    Recommendations for follow up therapy are one component of a multi-disciplinary discharge planning process, led by the attending physician.  Recommendations may be updated based on patient status, additional functional criteria and insurance authorization.    Follow Up Recommendations  Skilled nursing-short term rehab (<3 hours/day)    Assistance Recommended at Discharge    Equipment Recommendations  [facility]    Recommendations for Other Services      Precautions / Restrictions Precautions Precautions:  Fall       Mobility Bed Mobility Overal bed mobility: Needs Assistance Bed Mobility: Supine to Sit     Supine to sit: Min guard Sit to supine: Min guard        Transfers   Equipment used: Rolling walker (2 wheels) Transfers: Sit to/from Stand Sit to Stand: Min assist                 Balance Overall balance assessment: Needs assistance Sitting-balance support: Feet supported;No upper extremity supported Sitting balance-Leahy Scale: Good       Standing balance-Leahy Scale: Fair Standing balance comment: heavy use of RW                           ADL either performed or assessed with clinical judgement   ADL Overall ADL's : Needs assistance/impaired                                     Functional mobility during ADLs: Min guard General ADL Comments: SET UP at edge of bed for seated grooming (deoderant, washing face, brushing hair), MIN A for UB bathing in seated, MIN A for UB dressing; STS with MIN A, CGA two lateral steps to the left up the bed     Vision       Perception     Praxis      Cognition Arousal/Alertness: Awake/alert;Lethargic   Overall Cognitive Status: Within Functional Limits for tasks assessed  General Comments: alert and oriented x4, improved processing and safety awareness noted; intermittent vcs required for safety          Exercises Other Exercises Other Exercises: education re: role of OT, DC recommendations, safey during functional task completion           Pertinent Vitals/ Pain       Pain Assessment: No/denies pain  Home Living                                              Frequency  Min 1X/week        Progress Toward Goals  OT Goals(current goals can now be found in the care plan section)  Progress towards OT goals: Progressing toward goals     Plan Discharge plan remains appropriate;Frequency remains appropriate     Co-evaluation                 AM-PAC OT "6 Clicks" Daily Activity     Outcome Measure   Help from another person eating meals?: A Little Help from another person taking care of personal grooming?: A Little Help from another person toileting, which includes using toliet, bedpan, or urinal?: A Lot Help from another person bathing (including washing, rinsing, drying)?: A Little Help from another person to put on and taking off regular upper body clothing?: A Little Help from another person to put on and taking off regular lower body clothing?: A Lot 6 Click Score: 16    End of Session Equipment Utilized During Treatment: Gait belt;Rolling walker (2 wheels)  OT Visit Diagnosis: Other abnormalities of gait and mobility (R26.89);Repeated falls (R29.6);Muscle weakness (generalized) (M62.81)   Activity Tolerance Patient tolerated treatment well   Patient Left in bed;with bed alarm set;with call bell/phone within reach   Nurse Communication Mobility status        Time: 2694-8546 OT Time Calculation (min): 30 min  Charges: OT General Charges $OT Visit: 1 Visit OT Treatments $Self Care/Home Management : 23-37 mins  Shanon Payor, OTD OTR/L  11/15/20, 3:58 PM

## 2020-11-15 NOTE — TOC Progression Note (Signed)
Transition of Care [facility]) - Progression Note    Patient Details  Name: Steve Ramirez MRN: [id number] Date of Birth: 1935/08/29  Transition of Care [facility]) CM/SW [place], [place] Phone Number: 11/15/2020, 10:28 AM  Clinical Narrative:     Patient received insurance auth and medically cleared to dc to [facility] today, however upon COVID testing done yesterday patient tested positive for the Flu.   CSW spoke with [facility] who reports they have a 5 day period after flu positive, can  go to [facility] on 11/8.   CSW followed up with [facility] supervisor who reports this is standard across SNF facilities in which upon droplet precautions and flu positive, waiting period is 5 days until can go to facility.   TOC will continue to follow for needs. Family updated.   Expected Discharge Plan: [place] Barriers to Discharge: Continued Medical Work up  Expected Discharge Plan and Services Expected Discharge Plan: [place] arrangements for the past 2 months: [place] Passenger transport manager)                                       Social Determinants of Health (SDOH) Interventions    Readmission Risk Interventions No flowsheet data found.

## 2020-11-16 DIAGNOSIS — G9341 Metabolic encephalopathy: Secondary | ICD-10-CM | POA: Diagnosis not present

## 2020-11-16 LAB — GLUCOSE, CAPILLARY
Glucose-Capillary: 130 mg/dL — ABNORMAL HIGH (ref 70–99)
Glucose-Capillary: 185 mg/dL — ABNORMAL HIGH (ref 70–99)
Glucose-Capillary: 158 mg/dL — ABNORMAL HIGH (ref 70–99)
Glucose-Capillary: 173 mg/dL — ABNORMAL HIGH (ref 70–99)

## 2020-11-16 LAB — CREATININE, SERUM
Creatinine, Ser: 0.51 mg/dL — ABNORMAL LOW (ref 0.61–1.24)
GFR, Estimated: >60 mL/min (ref >60)

## 2020-11-16 MED ORDER — IPRATROPIUM-ALBUTEROL 0.5-2.5 (3) MG/3ML IN SOLN
3.0000 mL | Freq: Two times a day (BID) | RESPIRATORY_TRACT | Status: DC
Start: 2020-11-16 — End: 2020-11-17
  Administered 2020-11-17: 3 mL via RESPIRATORY_TRACT
  Filled 2020-11-16: qty 3

## 2020-11-16 NOTE — Progress Notes (Addendum)
PROGRESS NOTE  Steve Ramirez WYO:[id number] DOB: [birth year] DOA: 11/09/2020 PCP: Sharilyn Sites, MD  HPI/Recap of past 24 hours:  Brief Narrative:       This is an 85 year old male with medical history significant for multiple strokes recently discharged from the hospital on November 05, 2020 for TIA, hypertension GI bleed type 2 diabetes mellitus hyperlipidemia history of melanoma who was brought to the hospital because of altered mental status.  He initially presented to the emergency room on October 28 with slurred speech CT head did not show any acute abnormality and was discharged back to the assisted living facility He was admitted this time for acute confusional state.  Etiology of the change in mental status is not clear MRI of the brain with and without contrast did not show any acute abnormality he also had hypertensive urgency which was treated with antihypertensives.  Subjective: November 16, 2020: Patient seen and examined at bedside he stated he wants to go home and that his wife is currently at [facility] and was being discharged that day at the next day    Assessment/Plan: Principal Problem:   Acute metabolic encephalopathy Active Problems:   Diabetes mellitus without complication ([place])   History of TIAs   Essential hypertension   Hypothyroidism   CAD (coronary artery disease)   Hypertensive urgency   AMS (altered mental status)   Pressure injury of skin  Acute confusional state likely underlying dementia: > Improving Etiology is unclear MRI of the brain with and without contrast did not show any acute abnormality Per family patient has had progressive decline in mental status/confusion for 6 weeks or so prior to admission Neurology recommend Aricept nightly and outpatient work-up for dementia He also recommended repeat EEG if mental status did not improve by Wednesday, November 13, 2020 Patient still appeared confused and so on November 13, 2020 EKG EEG was  repeated and showed 6 significant improvement related to the prior EEG of October 31. Neurology has signed off   CAD, history of multiple TIAs and stroke: Continue Plavix aspirin and Pravachol     Hypertensive urgency: Blood pressure improved continue antihypertensive  Chronic diastolic CHF; compensated. 2D echo on October 24, 2020 showed normal ejection fraction and grade 2 diastolic dysfunction Continue antihypertensives    Type 2 diabetes:  Hemoglobin A1c was 6.5 Continue regular sliding scale insulin    Code Status:    Severity of Illness: The appropriate patient status for this patient is INPATIENT. Inpatient status is judged to be reasonable and necessary in order to provide the required intensity of service to ensure the patient's safety. The patient's presenting symptoms, physical exam findings, and initial radiographic and laboratory data in the context of their chronic comorbidities is felt to place them at high risk for further clinical deterioration. Furthermore, it is not anticipated that the patient will be medically stable for discharge from the hospital within 2 midnights of admission.   * I certify that at the point of admission it is my clinical judgment that the patient will require inpatient hospital care spanning beyond 2 midnights from the point of admission due to high intensity of service, high risk for further deterioration and high frequency of surveillance required.*   Family Communication:    Disposition Plan:   Status is: Inpatient   Dispo: The patient is from: SNF              Anticipated d/c is to: SNF  Anticipated d/c date is: Tuesday, November 19, 2020              Patient currently not medically stable for discharge  Consultants: Neurology  Procedures: MRI EEG  Antimicrobials:    DVT prophylaxis: Lovenox   Objective: Vitals:   11/15/20 2155 11/16/20 0013 11/16/20 0530 11/16/20 0820  BP: 128/86 (!) 142/97 (!) 172/78  140/72  Pulse:  73 79 68  Resp: 20 20  18   Temp:  98.1 F (36.7 C) 97.7 F (36.5 C) 97.9 F (36.6 C)  TempSrc:      SpO2:  98% 96% 100%  Weight:      Height:        Intake/Output Summary (Last 24 hours) at 11/16/2020 0919 Last data filed at 11/16/2020 0846 Gross per 24 hour  Intake 600 ml  Output 2075 ml  Net -1475 ml   Filed Weights   11/09/20 1955  Weight: 81.6 kg   Body mass index is 27.37 kg/m.  Exam:  General: 85 y.o. year-old male well developed well nourished in no acute distress.  Alert and oriented x3. Cardiovascular: Regular rate and rhythm with no rubs or gallops.  No thyromegaly or JVD noted.   Respiratory: Clear to auscultation with no wheezes or rales. Good inspiratory effort. Abdomen: Soft nontender nondistended with normal bowel sounds x4 quadrants. Musculoskeletal: No lower extremity edema. 2/4 pulses in all 4 extremities. Skin: No ulcerative lesions noted or rashes, Psychiatry: Mood is appropriate for condition and setting Neurology:    Data Reviewed: CBC: Recent Labs  Lab 11/09/20 2003 11/12/20 0618 11/15/20 0546  WBC 5.1  6.5 4.6 6.2  NEUTROABS 3.5 3.3  --   HGB 12.9*  12.9* 13.3 12.0*  HCT 40.2  42.1 42.0 39.3  MCV 71.3*  73.0* 70.5* 72.6*  PLT 247  253 217 937   Basic Metabolic Panel: Recent Labs  Lab 11/09/20 2003 11/12/20 0618 11/15/20 0546 11/16/20 0614  NA 134* 136 139  --   K 5.0 3.6 3.7  --   CL 105 107 105  --   CO2 19* 19* 24  --   GLUCOSE 152* 141* 147*  --   BUN 16 18 19   --   CREATININE 0.80 0.73 0.58* 0.51*  CALCIUM 9.3 8.8* 8.7*  --   MG  --   --  1.8  --   PHOS  --   --  3.1  --    GFR: Estimated Creatinine Clearance: 67.7 mL/min (A) (by C-G formula based on SCr of 0.51 mg/dL (L)). Liver Function Tests: No results for input(s): AST, ALT, ALKPHOS, BILITOT, PROT, ALBUMIN in the last 168 hours. No results for input(s): LIPASE, AMYLASE in the last 168 hours. Recent Labs  Lab 11/12/20 0618  AMMONIA 19    Coagulation Profile: Recent Labs  Lab 11/09/20 2003  INR 1.1   Cardiac Enzymes: No results for input(s): CKTOTAL, CKMB, CKMBINDEX, TROPONINI in the last 168 hours. BNP (last 3 results) No results for input(s): PROBNP in the last 8760 hours. HbA1C: No results for input(s): HGBA1C in the last 72 hours. CBG: Recent Labs  Lab 11/15/20 0833 11/15/20 1219 11/15/20 1555 11/15/20 2207 11/16/20 0758  GLUCAP 149* 232* 208* 134* 130*   Lipid Profile: No results for input(s): CHOL, HDL, LDLCALC, TRIG, CHOLHDL, LDLDIRECT in the last 72 hours. Thyroid Function Tests: No results for input(s): TSH, T4TOTAL, FREET4, T3FREE, THYROIDAB in the last 72 hours. Anemia Panel: No results for input(s): VITAMINB12, FOLATE, FERRITIN, TIBC,  IRON, RETICCTPCT in the last 72 hours. Urine analysis:    Component Value Date/Time   COLORURINE YELLOW (A) 11/10/2020 0152   APPEARANCEUR CLEAR (A) 11/10/2020 0152   LABSPEC 1.019 11/10/2020 0152   PHURINE 5.0 11/10/2020 0152   GLUCOSEU NEGATIVE 11/10/2020 0152   HGBUR SMALL (A) 11/10/2020 0152   BILIRUBINUR NEGATIVE 11/10/2020 0152   KETONESUR NEGATIVE 11/10/2020 0152   PROTEINUR NEGATIVE 11/10/2020 0152   UROBILINOGEN 0.2 04/15/2010 1425   NITRITE NEGATIVE 11/10/2020 0152   LEUKOCYTESUR NEGATIVE 11/10/2020 0152   Sepsis Labs: @LABRCNTIP (procalcitonin:4,lacticidven:4)  ) Recent Results (from the past 240 hour(s))  Resp Panel by RT-PCR (Flu A&B, Covid) Nasopharyngeal Swab     Status: None   Collection Time: 11/08/20 11:24 AM   Specimen: Nasopharyngeal Swab; Nasopharyngeal(NP) swabs in vial transport medium  Result Value Ref Range Status   SARS Coronavirus 2 by RT PCR NEGATIVE NEGATIVE Final    Comment: (NOTE) SARS-CoV-2 target nucleic acids are NOT DETECTED.  The SARS-CoV-2 RNA is generally detectable in upper respiratory specimens during the acute phase of infection. The lowest concentration of SARS-CoV-2 viral copies this assay can detect  is 138 copies/mL. A negative result does not preclude SARS-Cov-2 infection and should not be used as the sole basis for treatment or other patient management decisions. A negative result may occur with  improper specimen collection/handling, submission of specimen other than nasopharyngeal swab, presence of viral mutation(s) within the areas targeted by this assay, and inadequate number of viral copies(<138 copies/mL). A negative result must be combined with clinical observations, patient history, and epidemiological information. The expected result is Negative.  Fact Sheet for Patients:  EntrepreneurPulse.com.au  Fact Sheet for Healthcare Providers:  IncredibleEmployment.be  This test is no t yet approved or cleared by the [place] FDA and  has been authorized for detection and/or diagnosis of SARS-CoV-2 by FDA under an Emergency Use Authorization (EUA). This EUA will remain  in effect (meaning this test can be used) for the duration of the COVID-19 declaration under Section 564(b)(1) of the Act, 21 U.S.C.section 360bbb-3(b)(1), unless the authorization is terminated  or revoked sooner.       Influenza A by PCR NEGATIVE NEGATIVE Final   Influenza B by PCR NEGATIVE NEGATIVE Final    Comment: (NOTE) The Xpert Xpress SARS-CoV-2/FLU/RSV plus assay is intended as an aid in the diagnosis of influenza from Nasopharyngeal swab specimens and should not be used as a sole basis for treatment. Nasal washings and aspirates are unacceptable for Xpert Xpress SARS-CoV-2/FLU/RSV testing.  Fact Sheet for Patients: EntrepreneurPulse.com.au  Fact Sheet for Healthcare Providers: IncredibleEmployment.be  This test is not yet approved or cleared by the [place] FDA and has been authorized for detection and/or diagnosis of SARS-CoV-2 by FDA under an Emergency Use Authorization (EUA). This EUA will remain in effect  (meaning this test can be used) for the duration of the COVID-19 declaration under Section 564(b)(1) of the Act, 21 U.S.C. section 360bbb-3(b)(1), unless the authorization is terminated or revoked.  Performed at [facility], [address] [place], [place] [address]   Resp Panel by RT-PCR (Flu A&B, Covid) Nasopharyngeal Swab     Status: None   Collection Time: 11/09/20  9:14 PM   Specimen: Nasopharyngeal Swab; Nasopharyngeal(NP) swabs in vial transport medium  Result Value Ref Range Status   SARS Coronavirus 2 by RT PCR NEGATIVE NEGATIVE Final    Comment: (NOTE) SARS-CoV-2 target nucleic acids are NOT DETECTED.  The SARS-CoV-2 RNA is generally detectable  in upper respiratory specimens during the acute phase of infection. The lowest concentration of SARS-CoV-2 viral copies this assay can detect is 138 copies/mL. A negative result does not preclude SARS-Cov-2 infection and should not be used as the sole basis for treatment or other patient management decisions. A negative result may occur with  improper specimen collection/handling, submission of specimen other than nasopharyngeal swab, presence of viral mutation(s) within the areas targeted by this assay, and inadequate number of viral copies(<138 copies/mL). A negative result must be combined with clinical observations, patient history, and epidemiological information. The expected result is Negative.  Fact Sheet for Patients:  EntrepreneurPulse.com.au  Fact Sheet for Healthcare Providers:  IncredibleEmployment.be  This test is no t yet approved or cleared by the [place] FDA and  has been authorized for detection and/or diagnosis of SARS-CoV-2 by FDA under an Emergency Use Authorization (EUA). This EUA will remain  in effect (meaning this test can be used) for the duration of the COVID-19 declaration under Section 564(b)(1) of the Act, 21 U.S.C.section 360bbb-3(b)(1), unless  the authorization is terminated  or revoked sooner.       Influenza A by PCR NEGATIVE NEGATIVE Final   Influenza B by PCR NEGATIVE NEGATIVE Final    Comment: (NOTE) The Xpert Xpress SARS-CoV-2/FLU/RSV plus assay is intended as an aid in the diagnosis of influenza from Nasopharyngeal swab specimens and should not be used as a sole basis for treatment. Nasal washings and aspirates are unacceptable for Xpert Xpress SARS-CoV-2/FLU/RSV testing.  Fact Sheet for Patients: EntrepreneurPulse.com.au  Fact Sheet for Healthcare Providers: IncredibleEmployment.be  This test is not yet approved or cleared by the [place] FDA and has been authorized for detection and/or diagnosis of SARS-CoV-2 by FDA under an Emergency Use Authorization (EUA). This EUA will remain in effect (meaning this test can be used) for the duration of the COVID-19 declaration under Section 564(b)(1) of the Act, 21 U.S.C. section 360bbb-3(b)(1), unless the authorization is terminated or revoked.  Performed at [facility], [address] [place], [place] [address]   MRSA Next Gen by PCR, Nasal     Status: None   Collection Time: 11/12/20  7:40 PM   Specimen: Nasal Mucosa; Nasal Swab  Result Value Ref Range Status   MRSA by PCR Next Gen NOT DETECTED NOT DETECTED Final    Comment: (NOTE) The GeneXpert MRSA Assay (FDA approved for NASAL specimens only), is one component of a comprehensive MRSA colonization surveillance program. It is not intended to diagnose MRSA infection nor to guide or monitor treatment for MRSA infections. Test performance is not FDA approved in patients less than 33 years old. Performed at [facility], [address] [place], [place] [address]   Resp Panel by RT-PCR (Flu A&B, Covid) Nasopharyngeal Swab     Status: Abnormal   Collection Time: 11/14/20  4:30 PM   Specimen: Nasopharyngeal Swab; Nasopharyngeal(NP) swabs in vial  transport medium  Result Value Ref Range Status   SARS Coronavirus 2 by RT PCR NEGATIVE NEGATIVE Final    Comment: (NOTE) SARS-CoV-2 target nucleic acids are NOT DETECTED.  The SARS-CoV-2 RNA is generally detectable in upper respiratory specimens during the acute phase of infection. The lowest concentration of SARS-CoV-2 viral copies this assay can detect is 138 copies/mL. A negative result does not preclude SARS-Cov-2 infection and should not be used as the sole basis for treatment or other patient management decisions. A negative result may occur with  improper specimen collection/handling, submission of  specimen other than nasopharyngeal swab, presence of viral mutation(s) within the areas targeted by this assay, and inadequate number of viral copies(<138 copies/mL). A negative result must be combined with clinical observations, patient history, and epidemiological information. The expected result is Negative.  Fact Sheet for Patients:  EntrepreneurPulse.com.au  Fact Sheet for Healthcare Providers:  IncredibleEmployment.be  This test is no t yet approved or cleared by the [place] FDA and  has been authorized for detection and/or diagnosis of SARS-CoV-2 by FDA under an Emergency Use Authorization (EUA). This EUA will remain  in effect (meaning this test can be used) for the duration of the COVID-19 declaration under Section 564(b)(1) of the Act, 21 U.S.C.section 360bbb-3(b)(1), unless the authorization is terminated  or revoked sooner.       Influenza A by PCR POSITIVE (A) NEGATIVE Final   Influenza B by PCR NEGATIVE NEGATIVE Final    Comment: (NOTE) The Xpert Xpress SARS-CoV-2/FLU/RSV plus assay is intended as an aid in the diagnosis of influenza from Nasopharyngeal swab specimens and should not be used as a sole basis for treatment. Nasal washings and aspirates are unacceptable for Xpert Xpress SARS-CoV-2/FLU/RSV testing.  Fact  Sheet for Patients: EntrepreneurPulse.com.au  Fact Sheet for Healthcare Providers: IncredibleEmployment.be  This test is not yet approved or cleared by the [place] FDA and has been authorized for detection and/or diagnosis of SARS-CoV-2 by FDA under an Emergency Use Authorization (EUA). This EUA will remain in effect (meaning this test can be used) for the duration of the COVID-19 declaration under Section 564(b)(1) of the Act, 21 U.S.C. section 360bbb-3(b)(1), unless the authorization is terminated or revoked.  Performed at [facility], [address] [place], [place] [address]       Studies: No results found.  Scheduled Meds:   stroke: mapping our early stages of recovery book   Does not apply Once   amLODipine  10 mg Oral Daily   aspirin EC  81 mg Oral Daily   cloNIDine  0.3 mg Transdermal Q Mon   clopidogrel  75 mg Oral Daily   enoxaparin (LOVENOX) injection  40 mg Subcutaneous Q24H   ferrous sulfate  325 mg Oral Q breakfast   insulin aspart  0-15 Units Subcutaneous TID WC   insulin aspart  0-5 Units Subcutaneous QHS   ipratropium-albuterol  3 mL Nebulization TID   isosorbide mononitrate  30 mg Oral QPM   levothyroxine  50 mcg Oral Q0600   lisinopril  40 mg Oral QHS   loratadine  10 mg Oral Daily   metoprolol succinate  25 mg Oral QPM   oseltamivir  75 mg Oral BID   pravastatin  20 mg Oral QHS   rOPINIRole  1 mg Oral QHS   spironolactone  25 mg Oral QHS   vitamin B-12  1,000 mcg Oral Daily    Continuous Infusions:   LOS: 5 days     Cristal Deer, MD [facility]  To reach me or the doctor on call, go to: [email or web address] Password [email or web address]  11/16/2020, 9:19 AM

## 2020-11-17 DIAGNOSIS — G9341 Metabolic encephalopathy: Secondary | ICD-10-CM | POA: Diagnosis not present

## 2020-11-17 LAB — GLUCOSE, CAPILLARY
Glucose-Capillary: 145 mg/dL — ABNORMAL HIGH (ref 70–99)
Glucose-Capillary: 187 mg/dL — ABNORMAL HIGH (ref 70–99)
Glucose-Capillary: 186 mg/dL — ABNORMAL HIGH (ref 70–99)
Glucose-Capillary: 156 mg/dL — ABNORMAL HIGH (ref 70–99)

## 2020-11-17 NOTE — Progress Notes (Signed)
PROGRESS NOTE  Steve Ramirez:[id number] DOB: [birth year] DOA: 11/09/2020 PCP: Sharilyn Sites, MD  HPI/Recap of past 24 hours:  Brief Narrative:       This is an 85 year old male with medical history significant for multiple strokes recently discharged from the hospital on November 05, 2020 for TIA, hypertension GI bleed type 2 diabetes mellitus hyperlipidemia history of melanoma who was brought to the hospital because of altered mental status.  He initially presented to the emergency room on October 28 with slurred speech CT head did not show any acute abnormality and was discharged back to the assisted living facility He was admitted this time for acute confusional state.  Etiology of the change in mental status is not clear MRI of the brain with and without contrast did not show any acute abnormality he also had hypertensive urgency which was treated with antihypertensives.  Subjective: November 16, 2020: Patient seen and examined at bedside he stated he wants to go home and that his wife is currently at [facility] and was being discharged that day at the next day  November 17, 2020: Patient seen and examined at bedside He stated he was not aware that he going to the nursing home facility    Assessment/Plan: Principal Problem:   Acute metabolic encephalopathy Active Problems:   Diabetes mellitus without complication ([place])   History of TIAs   Essential hypertension   Hypothyroidism   CAD (coronary artery disease)   Hypertensive urgency   AMS (altered mental status)   Pressure injury of skin  Acute confusional state likely underlying dementia: > Improving Etiology is unclear MRI of the brain with and without contrast did not show any acute abnormality Per family patient has had progressive decline in mental status/confusion for 6 weeks or so prior to admission Neurology recommend Aricept nightly and outpatient work-up for dementia He also recommended repeat EEG if mental  status did not improve by Wednesday, November 13, 2020 Patient still appeared confused and so on November 13, 2020 EKG EEG was repeated and showed 6 significant improvement related to the prior EEG of October 31. Neurology has signed off   CAD, history of multiple TIAs and stroke: Continue Plavix aspirin and Pravachol     Hypertensive urgency: Blood pressure improved continue antihypertensive  Chronic diastolic CHF; compensated. 2D echo on October 24, 2020 showed normal ejection fraction and grade 2 diastolic dysfunction Continue antihypertensives    Type 2 diabetes:  Hemoglobin A1c was 6.5 Continue regular sliding scale insulin    Code Status:    Severity of Illness: The appropriate patient status for this patient is INPATIENT. Inpatient status is judged to be reasonable and necessary in order to provide the required intensity of service to ensure the patient's safety. The patient's presenting symptoms, physical exam findings, and initial radiographic and laboratory data in the context of their chronic comorbidities is felt to place them at high risk for further clinical deterioration. Furthermore, it is not anticipated that the patient will be medically stable for discharge from the hospital within 2 midnights of admission.   * I certify that at the point of admission it is my clinical judgment that the patient will require inpatient hospital care spanning beyond 2 midnights from the point of admission due to high intensity of service, high risk for further deterioration and high frequency of surveillance required.*   Family Communication:    Disposition Plan:   Status is: Inpatient   Dispo: The patient is from: SNF  Anticipated d/c is to: SNF              Anticipated d/c date is: Tuesday, November 19, 2020              Patient currently not medically stable for discharge  Consultants: Neurology  Procedures: MRI EEG  Antimicrobials:    DVT  prophylaxis: Lovenox   Objective: Vitals:   11/17/20 1148 11/17/20 1529 11/17/20 2000 11/17/20 2056  BP: (!) 157/71 (!) 152/62  (!) 146/77  Pulse: 64 79    Resp: 16 18 20 20   Temp: 97.6 F (36.4 C) 97.6 F (36.4 C)  (!) 97.4 F (36.3 C)  TempSrc:    Oral  SpO2: 99% 97%  97%  Weight:      Height:        Intake/Output Summary (Last 24 hours) at 11/17/2020 2101 Last data filed at 11/17/2020 1851 Gross per 24 hour  Intake 360 ml  Output 400 ml  Net -40 ml    Filed Weights   11/09/20 1955  Weight: 81.6 kg   Body mass index is 27.37 kg/m.  Exam:  General: 85 y.o. year-old male well developed well nourished in no acute distress.  Alert and oriented x3. Cardiovascular: Regular rate and rhythm with no rubs or gallops.  No thyromegaly or JVD noted.   Respiratory: Clear to auscultation with no wheezes or rales. Good inspiratory effort. Abdomen: Soft nontender nondistended with normal bowel sounds x4 quadrants. Musculoskeletal: No lower extremity edema. 2/4 pulses in all 4 extremities. Skin: No ulcerative lesions noted or rashes, Psychiatry: Mood is appropriate for condition and setting Neurology:    Data Reviewed: CBC: Recent Labs  Lab 11/12/20 0618 11/15/20 0546  WBC 4.6 6.2  NEUTROABS 3.3  --   HGB 13.3 12.0*  HCT 42.0 39.3  MCV 70.5* 72.6*  PLT 217 009    Basic Metabolic Panel: Recent Labs  Lab 11/12/20 0618 11/15/20 0546 11/16/20 0614  NA 136 139  --   K 3.6 3.7  --   CL 107 105  --   CO2 19* 24  --   GLUCOSE 141* 147*  --   BUN 18 19  --   CREATININE 0.73 0.58* 0.51*  CALCIUM 8.8* 8.7*  --   MG  --  1.8  --   PHOS  --  3.1  --     GFR: Estimated Creatinine Clearance: 67.7 mL/min (A) (by C-G formula based on SCr of 0.51 mg/dL (L)). Liver Function Tests: No results for input(s): AST, ALT, ALKPHOS, BILITOT, PROT, ALBUMIN in the last 168 hours. No results for input(s): LIPASE, AMYLASE in the last 168 hours. Recent Labs  Lab 11/12/20 0618   AMMONIA 19    Coagulation Profile: No results for input(s): INR, PROTIME in the last 168 hours.  Cardiac Enzymes: No results for input(s): CKTOTAL, CKMB, CKMBINDEX, TROPONINI in the last 168 hours. BNP (last 3 results) No results for input(s): PROBNP in the last 8760 hours. HbA1C: No results for input(s): HGBA1C in the last 72 hours. CBG: Recent Labs  Lab 11/16/20 1643 11/16/20 1950 11/17/20 0728 11/17/20 1148 11/17/20 1646  GLUCAP 158* 173* 145* 187* 186*    Lipid Profile: No results for input(s): CHOL, HDL, LDLCALC, TRIG, CHOLHDL, LDLDIRECT in the last 72 hours. Thyroid Function Tests: No results for input(s): TSH, T4TOTAL, FREET4, T3FREE, THYROIDAB in the last 72 hours. Anemia Panel: No results for input(s): VITAMINB12, FOLATE, FERRITIN, TIBC, IRON, RETICCTPCT in the last 72 hours. Urine  analysis:    Component Value Date/Time   COLORURINE YELLOW (A) 11/10/2020 0152   APPEARANCEUR CLEAR (A) 11/10/2020 0152   LABSPEC 1.019 11/10/2020 0152   PHURINE 5.0 11/10/2020 0152   GLUCOSEU NEGATIVE 11/10/2020 0152   HGBUR SMALL (A) 11/10/2020 0152   BILIRUBINUR NEGATIVE 11/10/2020 0152   KETONESUR NEGATIVE 11/10/2020 0152   PROTEINUR NEGATIVE 11/10/2020 0152   UROBILINOGEN 0.2 04/15/2010 1425   NITRITE NEGATIVE 11/10/2020 0152   LEUKOCYTESUR NEGATIVE 11/10/2020 0152   Sepsis Labs: @LABRCNTIP (procalcitonin:4,lacticidven:4)  ) Recent Results (from the past 240 hour(s))  Resp Panel by RT-PCR (Flu A&B, Covid) Nasopharyngeal Swab     Status: None   Collection Time: 11/08/20 11:24 AM   Specimen: Nasopharyngeal Swab; Nasopharyngeal(NP) swabs in vial transport medium  Result Value Ref Range Status   SARS Coronavirus 2 by RT PCR NEGATIVE NEGATIVE Final    Comment: (NOTE) SARS-CoV-2 target nucleic acids are NOT DETECTED.  The SARS-CoV-2 RNA is generally detectable in upper respiratory specimens during the acute phase of infection. The lowest concentration of SARS-CoV-2 viral  copies this assay can detect is 138 copies/mL. A negative result does not preclude SARS-Cov-2 infection and should not be used as the sole basis for treatment or other patient management decisions. A negative result may occur with  improper specimen collection/handling, submission of specimen other than nasopharyngeal swab, presence of viral mutation(s) within the areas targeted by this assay, and inadequate number of viral copies(<138 copies/mL). A negative result must be combined with clinical observations, patient history, and epidemiological information. The expected result is Negative.  Fact Sheet for Patients:  EntrepreneurPulse.com.au  Fact Sheet for Healthcare Providers:  IncredibleEmployment.be  This test is no t yet approved or cleared by the [place] FDA and  has been authorized for detection and/or diagnosis of SARS-CoV-2 by FDA under an Emergency Use Authorization (EUA). This EUA will remain  in effect (meaning this test can be used) for the duration of the COVID-19 declaration under Section 564(b)(1) of the Act, 21 U.S.C.section 360bbb-3(b)(1), unless the authorization is terminated  or revoked sooner.       Influenza A by PCR NEGATIVE NEGATIVE Final   Influenza B by PCR NEGATIVE NEGATIVE Final    Comment: (NOTE) The Xpert Xpress SARS-CoV-2/FLU/RSV plus assay is intended as an aid in the diagnosis of influenza from Nasopharyngeal swab specimens and should not be used as a sole basis for treatment. Nasal washings and aspirates are unacceptable for Xpert Xpress SARS-CoV-2/FLU/RSV testing.  Fact Sheet for Patients: EntrepreneurPulse.com.au  Fact Sheet for Healthcare Providers: IncredibleEmployment.be  This test is not yet approved or cleared by the [place] FDA and has been authorized for detection and/or diagnosis of SARS-CoV-2 by FDA under an Emergency Use Authorization (EUA). This  EUA will remain in effect (meaning this test can be used) for the duration of the COVID-19 declaration under Section 564(b)(1) of the Act, 21 U.S.C. section 360bbb-3(b)(1), unless the authorization is terminated or revoked.  Performed at [facility], [address] [place], [place] [address]   Resp Panel by RT-PCR (Flu A&B, Covid) Nasopharyngeal Swab     Status: None   Collection Time: 11/09/20  9:14 PM   Specimen: Nasopharyngeal Swab; Nasopharyngeal(NP) swabs in vial transport medium  Result Value Ref Range Status   SARS Coronavirus 2 by RT PCR NEGATIVE NEGATIVE Final    Comment: (NOTE) SARS-CoV-2 target nucleic acids are NOT DETECTED.  The SARS-CoV-2 RNA is generally detectable in upper respiratory specimens during the acute phase  of infection. The lowest concentration of SARS-CoV-2 viral copies this assay can detect is 138 copies/mL. A negative result does not preclude SARS-Cov-2 infection and should not be used as the sole basis for treatment or other patient management decisions. A negative result may occur with  improper specimen collection/handling, submission of specimen other than nasopharyngeal swab, presence of viral mutation(s) within the areas targeted by this assay, and inadequate number of viral copies(<138 copies/mL). A negative result must be combined with clinical observations, patient history, and epidemiological information. The expected result is Negative.  Fact Sheet for Patients:  EntrepreneurPulse.com.au  Fact Sheet for Healthcare Providers:  IncredibleEmployment.be  This test is no t yet approved or cleared by the [place] FDA and  has been authorized for detection and/or diagnosis of SARS-CoV-2 by FDA under an Emergency Use Authorization (EUA). This EUA will remain  in effect (meaning this test can be used) for the duration of the COVID-19 declaration under Section 564(b)(1) of the Act,  21 U.S.C.section 360bbb-3(b)(1), unless the authorization is terminated  or revoked sooner.       Influenza A by PCR NEGATIVE NEGATIVE Final   Influenza B by PCR NEGATIVE NEGATIVE Final    Comment: (NOTE) The Xpert Xpress SARS-CoV-2/FLU/RSV plus assay is intended as an aid in the diagnosis of influenza from Nasopharyngeal swab specimens and should not be used as a sole basis for treatment. Nasal washings and aspirates are unacceptable for Xpert Xpress SARS-CoV-2/FLU/RSV testing.  Fact Sheet for Patients: EntrepreneurPulse.com.au  Fact Sheet for Healthcare Providers: IncredibleEmployment.be  This test is not yet approved or cleared by the [place] FDA and has been authorized for detection and/or diagnosis of SARS-CoV-2 by FDA under an Emergency Use Authorization (EUA). This EUA will remain in effect (meaning this test can be used) for the duration of the COVID-19 declaration under Section 564(b)(1) of the Act, 21 U.S.C. section 360bbb-3(b)(1), unless the authorization is terminated or revoked.  Performed at [facility], [address] [place], [place] [address]   MRSA Next Gen by PCR, Nasal     Status: None   Collection Time: 11/12/20  7:40 PM   Specimen: Nasal Mucosa; Nasal Swab  Result Value Ref Range Status   MRSA by PCR Next Gen NOT DETECTED NOT DETECTED Final    Comment: (NOTE) The GeneXpert MRSA Assay (FDA approved for NASAL specimens only), is one component of a comprehensive MRSA colonization surveillance program. It is not intended to diagnose MRSA infection nor to guide or monitor treatment for MRSA infections. Test performance is not FDA approved in patients less than 36 years old. Performed at [facility], [address] [place], [place] [address]   Resp Panel by RT-PCR (Flu A&B, Covid) Nasopharyngeal Swab     Status: Abnormal   Collection Time: 11/14/20  4:30 PM   Specimen: Nasopharyngeal Swab;  Nasopharyngeal(NP) swabs in vial transport medium  Result Value Ref Range Status   SARS Coronavirus 2 by RT PCR NEGATIVE NEGATIVE Final    Comment: (NOTE) SARS-CoV-2 target nucleic acids are NOT DETECTED.  The SARS-CoV-2 RNA is generally detectable in upper respiratory specimens during the acute phase of infection. The lowest concentration of SARS-CoV-2 viral copies this assay can detect is 138 copies/mL. A negative result does not preclude SARS-Cov-2 infection and should not be used as the sole basis for treatment or other patient management decisions. A negative result may occur with  improper specimen collection/handling, submission of specimen other than nasopharyngeal swab, presence of viral  mutation(s) within the areas targeted by this assay, and inadequate number of viral copies(<138 copies/mL). A negative result must be combined with clinical observations, patient history, and epidemiological information. The expected result is Negative.  Fact Sheet for Patients:  EntrepreneurPulse.com.au  Fact Sheet for Healthcare Providers:  IncredibleEmployment.be  This test is no t yet approved or cleared by the [place] FDA and  has been authorized for detection and/or diagnosis of SARS-CoV-2 by FDA under an Emergency Use Authorization (EUA). This EUA will remain  in effect (meaning this test can be used) for the duration of the COVID-19 declaration under Section 564(b)(1) of the Act, 21 U.S.C.section 360bbb-3(b)(1), unless the authorization is terminated  or revoked sooner.       Influenza A by PCR POSITIVE (A) NEGATIVE Final   Influenza B by PCR NEGATIVE NEGATIVE Final    Comment: (NOTE) The Xpert Xpress SARS-CoV-2/FLU/RSV plus assay is intended as an aid in the diagnosis of influenza from Nasopharyngeal swab specimens and should not be used as a sole basis for treatment. Nasal washings and aspirates are unacceptable for Xpert Xpress  SARS-CoV-2/FLU/RSV testing.  Fact Sheet for Patients: EntrepreneurPulse.com.au  Fact Sheet for Healthcare Providers: IncredibleEmployment.be  This test is not yet approved or cleared by the [place] FDA and has been authorized for detection and/or diagnosis of SARS-CoV-2 by FDA under an Emergency Use Authorization (EUA). This EUA will remain in effect (meaning this test can be used) for the duration of the COVID-19 declaration under Section 564(b)(1) of the Act, 21 U.S.C. section 360bbb-3(b)(1), unless the authorization is terminated or revoked.  Performed at [facility], [address] [place], [place] [address]       Studies: No results found.  Scheduled Meds:   stroke: mapping our early stages of recovery book   Does not apply Once   amLODipine  10 mg Oral Daily   aspirin EC  81 mg Oral Daily   cloNIDine  0.3 mg Transdermal Q Mon   clopidogrel  75 mg Oral Daily   enoxaparin (LOVENOX) injection  40 mg Subcutaneous Q24H   ferrous sulfate  325 mg Oral Q breakfast   insulin aspart  0-15 Units Subcutaneous TID WC   insulin aspart  0-5 Units Subcutaneous QHS   isosorbide mononitrate  30 mg Oral QPM   levothyroxine  50 mcg Oral Q0600   lisinopril  40 mg Oral QHS   loratadine  10 mg Oral Daily   metoprolol succinate  25 mg Oral QPM   oseltamivir  75 mg Oral BID   pravastatin  20 mg Oral QHS   rOPINIRole  1 mg Oral QHS   spironolactone  25 mg Oral QHS   vitamin B-12  1,000 mcg Oral Daily    Continuous Infusions:   LOS: 6 days     Cristal Deer, MD [facility]  To reach me or the doctor on call, go to: [email or web address] Password [email or web address]  11/17/2020, 9:01 PM

## 2020-11-18 DIAGNOSIS — G9341 Metabolic encephalopathy: Secondary | ICD-10-CM | POA: Diagnosis not present

## 2020-11-18 LAB — GLUCOSE, CAPILLARY
Glucose-Capillary: 136 mg/dL — ABNORMAL HIGH (ref 70–99)
Glucose-Capillary: 233 mg/dL — ABNORMAL HIGH (ref 70–99)
Glucose-Capillary: 152 mg/dL — ABNORMAL HIGH (ref 70–99)
Glucose-Capillary: 164 mg/dL — ABNORMAL HIGH (ref 70–99)

## 2020-11-18 NOTE — Care Management Important Message (Signed)
Important Message  Patient Details  Name: UTAH DELAUDER MRN: [id number] Date of Birth: 10-27-1935   Medicare Important Message Given:  Yes     Juliann Pulse A Pati Thinnes 11/18/2020, 3:17 PM

## 2020-11-18 NOTE — TOC Progression Note (Addendum)
Transition of Care [facility]) - Progression Note    Patient Details  Name: Steve Ramirez MRN: [id number] Date of Birth: 1935/03/16  [facility]) CM/SW [place], RN Phone Number: 11/18/2020, 2:23 PM  Clinical Narrative:   Reached out to Otila Kluver at [facility] to confirm that they will be taking tomorrow, the patient has ins auth approved [id number] valid thru 11/19/20 [facility] Confirmed that the patient will DC there tomorrow   Expected Discharge Plan: [place] Barriers to Discharge: Continued Medical Work up  Expected Discharge Plan and Services Expected Discharge Plan: [place] arrangements for the past 2 months: [place] Passenger transport manager)                                       Social Determinants of Health (SDOH) Interventions    Readmission Risk Interventions No flowsheet data found.

## 2020-11-18 NOTE — Progress Notes (Signed)
PROGRESS NOTE  Steve Ramirez:[id number] DOB: 07-May-1935 DOA: 11/09/2020 PCP: Sharilyn Sites, MD  HPI/Recap of past 24 hours:  Brief Narrative:       This is an 85 year old male with medical history significant for multiple strokes recently discharged from the hospital on November 05, 2020 for TIA, hypertension GI bleed type 2 diabetes mellitus hyperlipidemia history of melanoma who was brought to the hospital because of altered mental status.  He initially presented to the emergency room on October 28 with slurred speech CT head did not show any acute abnormality and was discharged back to the assisted living facility  Readmitted with altered mental status, found to be hypertensive and positive for influenza A.  Started on Tamiflu.  PT is recommending SNF.  Should be able to go tomorrow after completing the course of Tamiflu per SNF requirement.  Subjective:  Patient was seen and examined today.  Was complaining of right cubital fossa tenderness, no significant edema or erythema but there was some knots, most likely secondary to prior IV. Told patient that warm compresses might help.  Daughter-in-law at bedside.  Assessment/Plan: Principal Problem:   Acute metabolic encephalopathy Active Problems:   Diabetes mellitus without complication (HCC)   History of TIAs   Essential hypertension   Hypothyroidism   CAD (coronary artery disease)   Hypertensive urgency   AMS (altered mental status)   Pressure injury of skin  Acute confusional state likely underlying dementia: Improved. MRI of the brain with and without contrast did not show any acute abnormality Per family patient has had progressive decline in mental status/confusion for 6 weeks or so prior to admission.  Most likely metabolic Perlick encephalopathy with hypertension and influenza A viral infection. Neurology recommend Aricept nightly and outpatient work-up for dementia He also recommended repeat EEG if mental  status did not improve by Wednesday, November 13, 2020 Patient still appeared confused and so on November 13, 2020 EKG EEG was repeated and showed 6 significant improvement related to the prior EEG of October 31. Neurology has signed off  Influenza A positive.  Clinically improved. -Continue with Tamiflu to complete a 5-day course.   CAD, history of multiple TIAs and stroke: Continue Plavix aspirin and Pravachol -Continue home dose of metoprolol and Imdur     Hypertensive urgency: Resolved.  Blood pressure now within goal. -Continue clonidine, Imdur, lisinopril, spironolactone and metoprolol  Chronic diastolic CHF; compensated. 2D echo on October 24, 2020 showed normal ejection fraction and grade 2 diastolic dysfunction Continue antihypertensives   Type 2 diabetes:  Hemoglobin A1c was 6.5 Continue regular sliding scale insulin  Code Status: Full  Family Communication: Discussed with daughter-in-law at bedside  Disposition Plan:   Status is: Inpatient   Dispo: The patient is from: SNF              Anticipated d/c is to: SNF              Anticipated d/c date is: Tuesday, November 19, 2020              Patient currently is medically stable for discharge, SNF wants to complete the course of Tamiflu before  admission  Consultants: Neurology  Procedures: MRI EEG  Antimicrobials:    DVT prophylaxis: Lovenox   Objective: Vitals:   11/18/20 0000 11/18/20 0057 11/18/20 0500 11/18/20 0737  BP:  108/71 123/74 132/60  Pulse:  74 70 71  Resp: 14 18 20 18   Temp:  98.4 F (36.9 C) 98 F (  36.7 C) 97.6 F (36.4 C)  TempSrc:  Oral Oral   SpO2:  97% 98% 96%  Weight:      Height:        Intake/Output Summary (Last 24 hours) at 11/18/2020 1231 Last data filed at 11/18/2020 0045 Gross per 24 hour  Intake --  Output 800 ml  Net -800 ml    Filed Weights   11/09/20 1955  Weight: 81.6 kg   Body mass index is 27.37 kg/m.  Exam:  General.  Hard of hearing elderly man,  in no acute distress. Pulmonary.  Lungs clear bilaterally, normal respiratory effort. CV.  Regular rate and rhythm, no JVD, rub or murmur. Abdomen.  Soft, nontender, nondistended, BS positive. CNS.  Alert and oriented .  No focal neurologic deficit. Extremities.  No edema, no cyanosis, pulses intact and symmetrical. Psychiatry.  Judgment and insight appears normal.    Data Reviewed: CBC: Recent Labs  Lab 11/12/20 0618 11/15/20 0546  WBC 4.6 6.2  NEUTROABS 3.3  --   HGB 13.3 12.0*  HCT 42.0 39.3  MCV 70.5* 72.6*  PLT 217 295    Basic Metabolic Panel: Recent Labs  Lab 11/12/20 0618 11/15/20 0546 11/16/20 0614  NA 136 139  --   K 3.6 3.7  --   CL 107 105  --   CO2 19* 24  --   GLUCOSE 141* 147*  --   BUN 18 19  --   CREATININE 0.73 0.58* 0.51*  CALCIUM 8.8* 8.7*  --   MG  --  1.8  --   PHOS  --  3.1  --     GFR: Estimated Creatinine Clearance: 67.7 mL/min (A) (by C-G formula based on SCr of 0.51 mg/dL (L)). Liver Function Tests: No results for input(s): AST, ALT, ALKPHOS, BILITOT, PROT, ALBUMIN in the last 168 hours. No results for input(s): LIPASE, AMYLASE in the last 168 hours. Recent Labs  Lab 11/12/20 0618  AMMONIA 19    Coagulation Profile: No results for input(s): INR, PROTIME in the last 168 hours.  Cardiac Enzymes: No results for input(s): CKTOTAL, CKMB, CKMBINDEX, TROPONINI in the last 168 hours. BNP (last 3 results) No results for input(s): PROBNP in the last 8760 hours. HbA1C: No results for input(s): HGBA1C in the last 72 hours. CBG: Recent Labs  Lab 11/17/20 1148 11/17/20 1646 11/17/20 2119 11/18/20 0823 11/18/20 1224  GLUCAP 187* 186* 156* 136* 233*    Lipid Profile: No results for input(s): CHOL, HDL, LDLCALC, TRIG, CHOLHDL, LDLDIRECT in the last 72 hours. Thyroid Function Tests: No results for input(s): TSH, T4TOTAL, FREET4, T3FREE, THYROIDAB in the last 72 hours. Anemia Panel: No results for input(s): VITAMINB12, FOLATE,  FERRITIN, TIBC, IRON, RETICCTPCT in the last 72 hours. Urine analysis:    Component Value Date/Time   COLORURINE YELLOW (A) 11/10/2020 0152   APPEARANCEUR CLEAR (A) 11/10/2020 0152   LABSPEC 1.019 11/10/2020 0152   PHURINE 5.0 11/10/2020 0152   GLUCOSEU NEGATIVE 11/10/2020 0152   HGBUR SMALL (A) 11/10/2020 0152   BILIRUBINUR NEGATIVE 11/10/2020 0152   KETONESUR NEGATIVE 11/10/2020 0152   PROTEINUR NEGATIVE 11/10/2020 0152   UROBILINOGEN 0.2 04/15/2010 1425   NITRITE NEGATIVE 11/10/2020 0152   LEUKOCYTESUR NEGATIVE 11/10/2020 0152   Sepsis Labs: @LABRCNTIP (procalcitonin:4,lacticidven:4)  ) Recent Results (from the past 240 hour(s))  Resp Panel by RT-PCR (Flu A&B, Covid) Nasopharyngeal Swab     Status: None   Collection Time: 11/09/20  9:14 PM   Specimen: Nasopharyngeal Swab; Nasopharyngeal(NP) swabs  in vial transport medium  Result Value Ref Range Status   SARS Coronavirus 2 by RT PCR NEGATIVE NEGATIVE Final    Comment: (NOTE) SARS-CoV-2 target nucleic acids are NOT DETECTED.  The SARS-CoV-2 RNA is generally detectable in upper respiratory specimens during the acute phase of infection. The lowest concentration of SARS-CoV-2 viral copies this assay can detect is 138 copies/mL. A negative result does not preclude SARS-Cov-2 infection and should not be used as the sole basis for treatment or other patient management decisions. A negative result may occur with  improper specimen collection/handling, submission of specimen other than nasopharyngeal swab, presence of viral mutation(s) within the areas targeted by this assay, and inadequate number of viral copies(<138 copies/mL). A negative result must be combined with clinical observations, patient history, and epidemiological information. The expected result is Negative.  Fact Sheet for Patients:  EntrepreneurPulse.com.au  Fact Sheet for Healthcare Providers:  IncredibleEmployment.be  This  test is no t yet approved or cleared by the [place] FDA and  has been authorized for detection and/or diagnosis of SARS-CoV-2 by FDA under an Emergency Use Authorization (EUA). This EUA will remain  in effect (meaning this test can be used) for the duration of the COVID-19 declaration under Section 564(b)(1) of the Act, 21 U.S.C.section 360bbb-3(b)(1), unless the authorization is terminated  or revoked sooner.       Influenza A by PCR NEGATIVE NEGATIVE Final   Influenza B by PCR NEGATIVE NEGATIVE Final    Comment: (NOTE) The Xpert Xpress SARS-CoV-2/FLU/RSV plus assay is intended as an aid in the diagnosis of influenza from Nasopharyngeal swab specimens and should not be used as a sole basis for treatment. Nasal washings and aspirates are unacceptable for Xpert Xpress SARS-CoV-2/FLU/RSV testing.  Fact Sheet for Patients: EntrepreneurPulse.com.au  Fact Sheet for Healthcare Providers: IncredibleEmployment.be  This test is not yet approved or cleared by the [place] FDA and has been authorized for detection and/or diagnosis of SARS-CoV-2 by FDA under an Emergency Use Authorization (EUA). This EUA will remain in effect (meaning this test can be used) for the duration of the COVID-19 declaration under Section 564(b)(1) of the Act, 21 U.S.C. section 360bbb-3(b)(1), unless the authorization is terminated or revoked.  Performed at [facility], [address] [place], [place] [address]   MRSA Next Gen by PCR, Nasal     Status: None   Collection Time: 11/12/20  7:40 PM   Specimen: Nasal Mucosa; Nasal Swab  Result Value Ref Range Status   MRSA by PCR Next Gen NOT DETECTED NOT DETECTED Final    Comment: (NOTE) The GeneXpert MRSA Assay (FDA approved for NASAL specimens only), is one component of a comprehensive MRSA colonization surveillance program. It is not intended to diagnose MRSA infection nor to guide or monitor treatment  for MRSA infections. Test performance is not FDA approved in patients less than 63 years old. Performed at [facility], [address] [place], [place] [address]   Resp Panel by RT-PCR (Flu A&B, Covid) Nasopharyngeal Swab     Status: Abnormal   Collection Time: 11/14/20  4:30 PM   Specimen: Nasopharyngeal Swab; Nasopharyngeal(NP) swabs in vial transport medium  Result Value Ref Range Status   SARS Coronavirus 2 by RT PCR NEGATIVE NEGATIVE Final    Comment: (NOTE) SARS-CoV-2 target nucleic acids are NOT DETECTED.  The SARS-CoV-2 RNA is generally detectable in upper respiratory specimens during the acute phase of infection. The lowest concentration of SARS-CoV-2 viral copies this assay can  detect is 138 copies/mL. A negative result does not preclude SARS-Cov-2 infection and should not be used as the sole basis for treatment or other patient management decisions. A negative result may occur with  improper specimen collection/handling, submission of specimen other than nasopharyngeal swab, presence of viral mutation(s) within the areas targeted by this assay, and inadequate number of viral copies(<138 copies/mL). A negative result must be combined with clinical observations, patient history, and epidemiological information. The expected result is Negative.  Fact Sheet for Patients:  EntrepreneurPulse.com.au  Fact Sheet for Healthcare Providers:  IncredibleEmployment.be  This test is no t yet approved or cleared by the [place] FDA and  has been authorized for detection and/or diagnosis of SARS-CoV-2 by FDA under an Emergency Use Authorization (EUA). This EUA will remain  in effect (meaning this test can be used) for the duration of the COVID-19 declaration under Section 564(b)(1) of the Act, 21 U.S.C.section 360bbb-3(b)(1), unless the authorization is terminated  or revoked sooner.       Influenza A by PCR POSITIVE (A) NEGATIVE  Final   Influenza B by PCR NEGATIVE NEGATIVE Final    Comment: (NOTE) The Xpert Xpress SARS-CoV-2/FLU/RSV plus assay is intended as an aid in the diagnosis of influenza from Nasopharyngeal swab specimens and should not be used as a sole basis for treatment. Nasal washings and aspirates are unacceptable for Xpert Xpress SARS-CoV-2/FLU/RSV testing.  Fact Sheet for Patients: EntrepreneurPulse.com.au  Fact Sheet for Healthcare Providers: IncredibleEmployment.be  This test is not yet approved or cleared by the [place] FDA and has been authorized for detection and/or diagnosis of SARS-CoV-2 by FDA under an Emergency Use Authorization (EUA). This EUA will remain in effect (meaning this test can be used) for the duration of the COVID-19 declaration under Section 564(b)(1) of the Act, 21 U.S.C. section 360bbb-3(b)(1), unless the authorization is terminated or revoked.  Performed at [facility], [address] [place], [place] [address]       Studies: No results found.  Scheduled Meds:   stroke: mapping our early stages of recovery book   Does not apply Once   amLODipine  10 mg Oral Daily   aspirin EC  81 mg Oral Daily   cloNIDine  0.3 mg Transdermal Q Mon   clopidogrel  75 mg Oral Daily   enoxaparin (LOVENOX) injection  40 mg Subcutaneous Q24H   ferrous sulfate  325 mg Oral Q breakfast   insulin aspart  0-15 Units Subcutaneous TID WC   insulin aspart  0-5 Units Subcutaneous QHS   isosorbide mononitrate  30 mg Oral QPM   levothyroxine  50 mcg Oral Q0600   lisinopril  40 mg Oral QHS   loratadine  10 mg Oral Daily   metoprolol succinate  25 mg Oral QPM   oseltamivir  75 mg Oral BID   pravastatin  20 mg Oral QHS   rOPINIRole  1 mg Oral QHS   spironolactone  25 mg Oral QHS   vitamin B-12  1,000 mcg Oral Daily    Continuous Infusions:   LOS: 7 days   This record has been created using Systems analyst.  Errors have been sought and corrected,but may not always be located. Such creation errors do not reflect on the standard of care.   Lorella Nimrod, MD [facility]  To reach me or the doctor on call, go to: [email or web address] Password [facility]  11/18/2020, 12:31 PM

## 2020-11-18 NOTE — Progress Notes (Signed)
Physical Therapy Treatment Patient Details Name: Steve Ramirez MRN: [id number] DOB: 07-27-1935 Today's Date: 11/18/2020   History of Present Illness Pt is an 85 y/o M admitted on 11/09/20 after presenting with c/o AMS. Pt was recently hospitalized from 10/23-10/25 with TIA presenting with AMS with negative EEG & MRI. Pt returned to ED on 10/28 with c/o slurred speech that resolved upon arrival; head CT was negative & he was sent back to ALF. Pt was later found slumped over in his chair & stopped talking so was brought back to ED. He showed no focalization of symptoms & no seizure activity. MRI was negative for acute changes. PMH: HTN, DM, multiple CVAs & TIAs, melanoma    PT Comments    Pt seen for PT tx with pt agreeable to session. Pt demonstrates significant improvement both cognitively & physically since this PT evaluated him. Pt is AxO x3 & able to complete bed mobility, transfers & gait with RW & supervision. Pt ambulates 2 laps around nurses station with decreased gait speed but ability to negotiate around obstacles. Will continue to follow pt acutely to address balance, endurance, & gait with LRAD.    Recommendations for follow up therapy are one component of a multi-disciplinary discharge planning process, led by the attending physician.  Recommendations may be updated based on patient status, additional functional criteria and insurance authorization.  Follow Up Recommendations  Skilled nursing-short term rehab (<3 hours/day)     Assistance Recommended at Discharge Frequent or constant Supervision/Assistance  Equipment Recommendations  None recommended by PT    Recommendations for Other Services       Precautions / Restrictions Precautions Precautions: Fall Restrictions Weight Bearing Restrictions: No     Mobility  Bed Mobility Overal bed mobility: Needs Assistance Bed Mobility: Supine to Sit;Sit to Supine     Supine to sit: Supervision;HOB elevated Sit to  supine: Supervision;HOB elevated        Transfers Overall transfer level: Needs assistance Equipment used: Rolling walker (2 wheels) Transfers: Sit to/from Stand Sit to Stand: Supervision           General transfer comment: cuing for safe hand placement    Ambulation/Gait Ambulation/Gait assistance: Supervision Gait Distance (Feet): 320 Feet Assistive device: Rolling walker (2 wheels) Gait Pattern/deviations: Decreased step length - left;Decreased step length - right Gait velocity: decreased     General Gait Details: Pt ambulates around nurses station x 2 laps, keeping in line with line on the floor, able to negotiate around obstacles with extra time.   Stairs             Wheelchair Mobility    Modified Rankin (Stroke Patients Only)       Balance Overall balance assessment: Needs assistance Sitting-balance support: Feet supported;No upper extremity supported Sitting balance-Leahy Scale: Good     Standing balance support: Bilateral upper extremity supported;During functional activity Standing balance-Leahy Scale: Fair Standing balance comment: BUE support on RW                            Cognition Arousal/Alertness: Awake/alert Behavior During Therapy: WFL for tasks assessed/performed Overall Cognitive Status: History of cognitive impairments - at baseline Area of Impairment: Orientation;Memory;Following commands;Awareness;Safety/judgement;Problem solving;Attention                 Orientation Level: Disoriented to;Situation (oriented to year)   Memory: Decreased short-term memory Following Commands: Follows one step commands consistently;Follows multi-step commands with increased time  Awareness: Intellectual Problem Solving: Slow processing;Requires tactile cues;Requires verbal cues;Difficulty sequencing;Decreased initiation          Exercises      General Comments        Pertinent Vitals/Pain Pain Assessment:  No/denies pain    Home Living                          Prior Function            PT Goals (current goals can now be found in the care plan section) Acute Rehab PT Goals Patient Stated Goal: none stated PT Goal Formulation: With patient Time For Goal Achievement: 11/24/20 Potential to Achieve Goals: Fair Progress towards PT goals: Progressing toward goals    Frequency    Min 2X/week      PT Plan Current plan remains appropriate    Co-evaluation              AM-PAC PT "6 Clicks" Mobility   Outcome Measure  Help needed turning from your back to your side while in a flat bed without using bedrails?: A Little Help needed moving from lying on your back to sitting on the side of a flat bed without using bedrails?: A Little Help needed moving to and from a bed to a chair (including a wheelchair)?: A Little Help needed standing up from a chair using your arms (e.g., wheelchair or bedside chair)?: A Little Help needed to walk in hospital room?: A Little Help needed climbing 3-5 steps with a railing? : A Little 6 Click Score: 18    End of Session Equipment Utilized During Treatment: Gait belt Activity Tolerance: Patient tolerated treatment well Patient left: in bed;with call bell/phone within reach;with bed alarm set   PT Visit Diagnosis: Unsteadiness on feet (R26.81);Muscle weakness (generalized) (M62.81);History of falling (Z91.81);Difficulty in walking, not elsewhere classified (R26.2)     Time: 2197-5883 PT Time Calculation (min) (ACUTE ONLY): 20 min  Charges:  $Therapeutic Activity: 8-22 mins                     Lavone Nian, PT, DPT 11/18/20, 4:05 PM    Waunita Schooner 11/18/2020, 4:04 PM

## 2020-11-19 DIAGNOSIS — G9341 Metabolic encephalopathy: Secondary | ICD-10-CM | POA: Diagnosis not present

## 2020-11-19 LAB — RESP PANEL BY RT-PCR (FLU A&B, COVID) ARPGX2
Influenza A by PCR: POSITIVE — AB
Influenza B by PCR: NEGATIVE
SARS Coronavirus 2 by RT PCR: NEGATIVE

## 2020-11-19 LAB — GLUCOSE, CAPILLARY
Glucose-Capillary: 142 mg/dL — ABNORMAL HIGH (ref 70–99)
Glucose-Capillary: 216 mg/dL — ABNORMAL HIGH (ref 70–99)

## 2020-11-19 MED ORDER — ACETAMINOPHEN 325 MG PO TABS: 650.0000 mg | ORAL_TABLET | ORAL | Status: DC | PRN

## 2020-11-19 MED ORDER — AMLODIPINE BESYLATE 10 MG PO TABS: 10.0000 mg | ORAL_TABLET | Freq: Every day | ORAL | Status: DC

## 2020-11-19 MED ORDER — IPRATROPIUM-ALBUTEROL 0.5-2.5 (3) MG/3ML IN SOLN: 3.0000 mL | RESPIRATORY_TRACT | Status: DC | PRN

## 2020-11-19 NOTE — Plan of Care (Signed)
  Problem: Education: Goal: Knowledge of General Education information will improve Description: Including pain rating scale, medication(s)/side effects and non-pharmacologic comfort measures Outcome: Adequate for Discharge   Problem: Health Behavior/Discharge Planning: Goal: Ability to manage health-related needs will improve Outcome: Adequate for Discharge   Problem: Clinical Measurements: Goal: Ability to maintain clinical measurements within normal limits will improve Outcome: Adequate for Discharge Goal: Will remain free from infection Outcome: Adequate for Discharge Goal: Diagnostic test results will improve Outcome: Adequate for Discharge Goal: Respiratory complications will improve Outcome: Adequate for Discharge Goal: Cardiovascular complication will be avoided Outcome: Adequate for Discharge   Problem: Activity: Goal: Risk for activity intolerance will decrease Outcome: Adequate for Discharge   Problem: Nutrition: Goal: Adequate nutrition will be maintained Outcome: Adequate for Discharge   Problem: Coping: Goal: Level of anxiety will decrease Outcome: Adequate for Discharge   Problem: Elimination: Goal: Will not experience complications related to bowel motility Outcome: Adequate for Discharge Goal: Will not experience complications related to urinary retention Outcome: Adequate for Discharge   Problem: Pain Managment: Goal: General experience of comfort will improve Outcome: Adequate for Discharge   Problem: Safety: Goal: Ability to remain free from injury will improve Outcome: Adequate for Discharge   Problem: Skin Integrity: Goal: Risk for impaired skin integrity will decrease Outcome: Adequate for Discharge   Problem: Education: Goal: Knowledge of disease or condition will improve Outcome: Adequate for Discharge Goal: Knowledge of secondary prevention will improve (SELECT ALL) Outcome: Adequate for Discharge Goal: Knowledge of patient specific  risk factors will improve (INDIVIDUALIZE FOR PATIENT) Outcome: Adequate for Discharge   

## 2020-11-19 NOTE — Progress Notes (Signed)
Report was called to nurse Caryl Pina at [facility]. All questions were answered.

## 2020-11-19 NOTE — Discharge Summary (Signed)
Physician Discharge Summary  Steve Ramirez CXK:[id number] DOB: 1935-12-20 DOA: 11/09/2020  PCP: Sharilyn Sites, MD  Admit date: 11/09/2020 Discharge date: 11/19/2020  Admitted From: Assisted living facility Disposition: SNF  Recommendations for Outpatient Follow-up:  Follow up with PCP in 1-2 weeks Follow-up with neurology Please obtain BMP/CBC in one week Please follow up on the following pending results: None  Home Health: No Equipment/Devices: Rolling walker Discharge Condition: Stable CODE STATUS: Full Diet recommendation: Heart Healthy / Carb Modified    Brief/Interim Summary:  This is an 85 year old male with medical history significant for multiple strokes recently discharged from the hospital on November 05, 2020 for TIA, hypertension GI bleed type 2 diabetes mellitus hyperlipidemia history of melanoma who was brought to the hospital because of altered mental status.  He initially presented to the emergency room on October 28 with slurred speech CT head did not show any acute abnormality and was discharged back to the assisted living facility   Readmitted with altered mental status, found to be hypertensive and positive for influenza A.  Started on Tamiflu and completed a 5-day course.  Acute metabolic encephalopathy was thought to be multifactorial with underlying dementia and recent influenza A infection.  MRI of the brain with and without contrast did not show any acute abnormality.  Per family patient has an progressive decline in the mental status and confusion for many weeks prior to the admission.  Neurology was also consulted and they recommend starting him on Aricept and outpatient work-up for dementia. Patient had EEG twice during current hospitalization, initial with encephalopathy and no concern for seizure-like activity.  Repeat EEG on 11/13/2020 with significant improvement of encephalopathy.  Patient is on DAPT for history of multiple TIAs, CVA and CAD and he will  continue with DAPT and statin.  His initial blood pressure was elevated.  He was on multiple antihypertensives at home, not sure about the compliance.  We added amlodipine and blood pressure is currently well controlled on clonidine, Imdur, lisinopril, amlodipine, spironolactone and metoprolol.  Patient also has HFpEF which seems well compensated during current hospitalization.  Recent echocardiogram done in October 24, 2020 showed normal EF and grade 2 diastolic dysfunction.  His type 2 diabetes is also well controlled with A1c of 6.5.  We managed his diabetes with SSI while in the hospital and he can resume his home dose of metformin on discharge.  Patient will continue with rest of his medications and follow-up with his providers.  Discharge Diagnoses:  Principal Problem:   Acute metabolic encephalopathy Active Problems:   Diabetes mellitus without complication (HCC)   History of TIAs   Essential hypertension   Hypothyroidism   CAD (coronary artery disease)   Hypertensive urgency   AMS (altered mental status)   Pressure injury of skin   Discharge Instructions  Discharge Instructions     Diet - low sodium heart healthy   Complete by: As directed    Increase activity slowly   Complete by: As directed    No dressing needed   Complete by: As directed       Allergies as of 11/19/2020   No Known Allergies      Medication List     TAKE these medications    acetaminophen 325 MG tablet Commonly known as: TYLENOL Take 2 tablets (650 mg total) by mouth every 4 (four) hours as needed for mild pain (or temp > 37.5 C (99.5 F)).   amLODipine 10 MG tablet Commonly known as: NORVASC Take  1 tablet (10 mg total) by mouth daily.   aspirin EC 81 MG tablet Take 1 tablet (81 mg total) by mouth 2 (two) times daily. For DVT prophylaxis for 30 days after surgery.   cetirizine 10 MG tablet Commonly known as: ZYRTEC Take 10 mg by mouth daily.   cloNIDine 0.3 mg/24hr  patch Commonly known as: CATAPRES - Dosed in mg/24 hr Place 1 patch (0.3 mg total) onto the skin once a week. What changed: when to take this   clopidogrel 75 MG tablet Commonly known as: PLAVIX Take 1 tablet (75 mg total) by mouth daily.   ferrous sulfate 325 (65 FE) MG tablet Take 325 mg by mouth daily with breakfast.   Fish Oil 1000 MG Caps Take 1,000 mg by mouth at bedtime.   ipratropium-albuterol 0.5-2.5 (3) MG/3ML Soln Commonly known as: DUONEB Take 3 mLs by nebulization every 4 (four) hours as needed.   isosorbide mononitrate 30 MG 24 hr tablet Commonly known as: IMDUR Take 30 mg by mouth every evening.   levothyroxine 50 MCG tablet Commonly known as: SYNTHROID Take 1 tablet (50 mcg total) by mouth daily.   lisinopril 40 MG tablet Commonly known as: ZESTRIL Take 1 tablet (40 mg total) by mouth daily. What changed: when to take this   metFORMIN 1000 MG tablet Commonly known as: GLUCOPHAGE Take 1,000 mg by mouth 2 (two) times daily.   metoprolol succinate 25 MG 24 hr tablet Commonly known as: TOPROL-XL Take 25 mg by mouth every evening.   multivitamin tablet Take 1 tablet by mouth daily.   nitroGLYCERIN 0.4 MG SL tablet Commonly known as: NITROSTAT Place 0.4 mg under the tongue every 5 (five) minutes as needed for chest pain.   pravastatin 20 MG tablet Commonly known as: PRAVACHOL Take 20 mg by mouth at bedtime.   rOPINIRole 0.5 MG tablet Commonly known as: REQUIP Take 2 tablets (1 mg total) by mouth at bedtime.   spironolactone 25 MG tablet Commonly known as: ALDACTONE Take 1 tablet (25 mg total) by mouth daily. What changed: when to take this   vitamin B-12 1000 MCG tablet Commonly known as: CYANOCOBALAMIN Take 1 tablet (1,000 mcg total) by mouth daily.               Discharge Care Instructions  (From admission, onward)           Start     Ordered   11/19/20 0000  No dressing needed        11/19/20 8341             Follow-up Information     Sharilyn Sites, MD. Schedule an appointment as soon as possible for a visit in 1 week(s).   Specialty: Family Medicine Contact information: [address] [place] [address] [phone]                No Known Allergies  Consultations: Neurology  Procedures/Studies: DG Chest 1 View  Result Date: 11/09/2020 CLINICAL DATA:  Hypertension EXAM: CHEST  1 VIEW COMPARISON:  11/03/2020 FINDINGS: Lungs volumes are small, but are symmetric and are clear. No pneumothorax or pleural effusion. Cardiac size within normal limits. Pulmonary vascularity is normal. Osseous structures are age-appropriate. No acute bone abnormality. IMPRESSION: Pulmonary hypoinflation Electronically Signed   By: Fidela Salisbury M.D.   On: 11/09/2020 20:48   CT HEAD WO CONTRAST  Result Date: 11/09/2020 CLINICAL DATA:  Weakness and vomiting. EXAM: CT HEAD WITHOUT CONTRAST TECHNIQUE: Contiguous axial images were obtained  from the base of the skull through the vertex without intravenous contrast. COMPARISON:  November 08, 2020 FINDINGS: Brain: There is mild cerebral atrophy with widening of the extra-axial spaces and ventricular dilatation. There are areas of decreased attenuation within the white matter tracts of the supratentorial brain, consistent with microvascular disease changes. Small, chronic bilateral basal ganglia lacunar infarcts are seen. Vascular: No hyperdense vessel or unexpected calcification. Skull: Normal. Negative for fracture or focal lesion. Sinuses/Orbits: No acute finding. Other: None. IMPRESSION: 1. Generalized cerebral atrophy. 2. Small, chronic bilateral basal ganglia lacunar infarcts. 3. No acute intracranial abnormality. Electronically Signed   By: Virgina Norfolk M.D.   On: 11/09/2020 20:44   CT HEAD WO CONTRAST (5MM)  Result Date: 11/08/2020 CLINICAL DATA:  Transient ischemic attack (TIA) EXAM: CT HEAD WITHOUT CONTRAST TECHNIQUE: Contiguous axial images  were obtained from the base of the skull through the vertex without intravenous contrast. COMPARISON:  November 03, 2020. FINDINGS: Brain: Similar remote infarcts in the left thalamus and posterior limb the internal capsule. Similar advanced patchy moderate to advanced white matter hypoattenuation, nonspecific but compatible with chronic microvascular ischemic disease. No evidence of acute large vascular territory infarct. No acute hemorrhage, hydrocephalus, mass lesion midline shift. Mild for age atrophy. Vascular: No hyperdense vessel identified. Calcific intracranial atherosclerosis. Skull: No acute fracture. Sinuses/Orbits: Mild mucosal thickening in the visualized sinuses. Unremarkable orbits. Other: No mastoid effusions. IMPRESSION: 1. No evidence of acute intracranial abnormality. 2. Remote infarcts in the left thalamus/posterior limb internal capsule. 3. Advanced chronic microvascular disease. Electronically Signed   By: Margaretha Sheffield M.D.   On: 11/08/2020 11:22   MR BRAIN WO CONTRAST  Result Date: 11/09/2020 CLINICAL DATA:  Follow-up examination for acute stroke. EXAM: MRI HEAD WITHOUT CONTRAST TECHNIQUE: Multiplanar, multiecho pulse sequences of the brain and surrounding structures were obtained without intravenous contrast. COMPARISON:  Prior head CT from earlier the same day as well as recent MRI from 11/03/2020. FINDINGS: Brain: Diffuse prominence of the CSF containing spaces compatible generalized age-related cerebral atrophy. Patchy and confluent T2/FLAIR hyperintensity involving the periventricular and deep white matter both cerebral hemispheres most consistent with chronic small vessel ischemic disease, moderately advanced in nature. Few scattered remote lacunar infarcts present about the posterior limb of the left internal capsule, left thalamus, bilateral basal ganglia, and right pons. No abnormal foci of restricted diffusion to suggest acute or subacute ischemia. Gray-white matter  differentiation otherwise maintained. No encephalomalacia to suggest chronic cortical infarction. No acute or chronic intracranial hemorrhage. No mass lesion, midline shift or mass effect. No hydrocephalus or extra-axial fluid collection. Pituitary gland suprasellar region within normal limits. Midline structures intact. Vascular: Major intracranial vascular flow voids are maintained. Skull and upper cervical spine: Craniocervical junction within normal limits. Bone marrow signal intensity normal. No scalp soft tissue abnormality. Sinuses/Orbits: Globes and orbital soft tissues demonstrate no acute finding. Mild mucosal thickening noted within the ethmoidal air cells and maxillary sinuses. Paranasal sinuses are otherwise clear. Trace left mastoid effusion noted, of doubtful significance. Inner ear structures grossly normal. Other: None. IMPRESSION: 1. No acute intracranial abnormality. 2. Moderately advanced cerebral atrophy with chronic microvascular ischemic disease, with a few scattered remote lacunar infarcts involving the posterior limb of the left internal capsule, left thalamus, bilateral basal ganglia, and right pons. Electronically Signed   By: Jeannine Boga M.D.   On: 11/09/2020 23:26   MR BRAIN WO CONTRAST  Result Date: 11/03/2020 CLINICAL DATA:  Neuro deficit, acute, stroke suspected. Speech disturbance. Weakness. EXAM: MRI HEAD  WITHOUT CONTRAST TECHNIQUE: Multiplanar, multiecho pulse sequences of the brain and surrounding structures were obtained without intravenous contrast. COMPARISON:  Head CT same day.  MRI 10/03/2020. FINDINGS: Brain: Diffusion imaging does not show any acute or subacute stroke or other cause of restricted diffusion. No focal abnormality seen affecting the brainstem or cerebellum. There are old lacunar infarctions in the left thalamus and posterior limb internal capsule. Extensive chronic small-vessel ischemic changes are present throughout the cerebral hemispheric  white matter. No mass, hemorrhage, hydrocephalus or extra-axial collection. Vascular: Major vessels at the base of the brain show flow. Skull and upper cervical spine: Negative Sinuses/Orbits: Clear/normal Other: None IMPRESSION: No acute finding. Chronic small-vessel ischemic changes of the cerebral hemispheric white matter. Old lacunar infarctions left thalamus and posterior limb internal capsule. Electronically Signed   By: Nelson Chimes M.D.   On: 11/03/2020 16:40   MR BRAIN W CONTRAST  Result Date: 11/10/2020 CLINICAL DATA:  Seizure with abnormal neuro exam EXAM: MRI HEAD WITH CONTRAST TECHNIQUE: Multiplanar, multiecho pulse sequences of the brain and surrounding structures were obtained with intravenous contrast. CONTRAST:  7.86mL GADAVIST GADOBUTROL 1 MMOL/ML IV SOLN COMPARISON:  Noncontrast brain MRI from yesterday FINDINGS: Brain: No abnormal enhancement. Parenchymal changes as described on full brain MRI yesterday. No interval finding. Vascular: Major vessels are enhancing. Skull and upper cervical spine: No abnormal enhancement Sinuses/Orbits: Negative IMPRESSION: No abnormal enhancement. Electronically Signed   By: Jorje Guild M.D.   On: 11/10/2020 11:02   DG Chest Port 1 View  Result Date: 11/03/2020 CLINICAL DATA:  Altered mental status. EXAM: PORTABLE CHEST 1 VIEW COMPARISON:  10/03/2020 FINDINGS: Heart is enlarged. There are no focal consolidations or pleural effusions. Mild perihilar peribronchial thickening. IMPRESSION: Stable cardiomegaly.  Bronchitic changes. Electronically Signed   By: Nolon Nations M.D.   On: 11/03/2020 18:58   EEG adult  Result Date: 11/13/2020 Derek Jack, MD     11/13/2020  7:18 PM Routine EEG Report Steve Ramirez is a 85 y.o. male with a history of spells who is undergoing an EEG to evaluate for seizures. Report: This EEG was acquired with electrodes placed according to the International 10-20 electrode system (including Fp1, Fp2, F3, F4, C3,  C4, P3, P4, O1, O2, T3, T4, T5, T6, A1, A2, Fz, Cz, Pz). The following electrodes were missing or displaced: none. The occipital dominant rhythm was 8.5 Hz. This activity is reactive to stimulation. Drowsiness was manifested by background fragmentation; deeper stages of sleep were not identified. There was no focal slowing. There were no interictal epileptiform discharges. There were no electrographic seizures identified. There was no abnormal response to photic stimulation or hyperventilation. Impression: This EEG was obtained while awake and drowsy and is normal.   Clinical Correlation: Normal EEGs, however, do not rule out epilepsy. Su Monks, MD [facility] [phone] If 7pm- 7am, please page neurology on call as listed in [place].   EEG adult  Result Date: 11/11/2020 Lora Havens, MD     11/11/2020  5:33 PM Patient Name: Steve Ramirez MRN: [id number] Epilepsy Attending: Lora Havens Referring Physician/Provider: Dr. Su Monks Date: 11/11/2020 Duration: 20.36 mins Patient history: 85 year old male with altered mental status.  EEG to evaluate for seizure. Level of alertness: Awake, asleep AEDs during EEG study: None Technical aspects: This EEG study was done with scalp electrodes positioned according to the 10-20 International system of electrode placement. Electrical activity was acquired at a sampling rate of 500Hz  and reviewed with a high frequency  filter of 70Hz  and a low frequency filter of 1Hz . EEG data were recorded continuously and digitally stored. Description: No clear posterior dominant rhythm was seen. Sleep was characterized by vertex waves, sleep spindles (12 to 14 Hz), maximal frontocentral region. EEG showed continuous generalized 3 to 6 Hz theta-delta slowing. Generalized periodic discharges with triphasic morphology at 1Hz  were also noted, more prominent when awake/stimulated. Hyperventilation and photic stimulation were not performed.   ABNORMALITY -  Periodic discharges with triphasic morphology, generalized ( GPDs) - Continuous slow, generalized IMPRESSION: This study showed periodic discharges with triphasic morphology which can be on the ictal-interictal continuum. However, the morphology, frequency and reactivity to stimulation is more likely indicative of toxic-metabolic causes. There is also moderate diffuse encephalopathy, nonspecific etiology. No seizures or definite epileptiform discharges were seen throughout the recording. Lora Havens   EEG adult  Result Date: 11/04/2020 Derek Jack, MD     11/04/2020  2:13 PM Routine EEG Report Steve Ramirez is a 85 y.o. male with a history of spells who is undergoing an EEG to evaluate for seizures. Report: This EEG was acquired with electrodes placed according to the International 10-20 electrode system (including Fp1, Fp2, F3, F4, C3, C4, P3, P4, O1, O2, T3, T4, T5, T6, A1, A2, Fz, Cz, Pz). The following electrodes were missing or displaced: none. The occipital dominant rhythm was 7 Hz. This activity is reactive to stimulation. Drowsiness was manifested by background fragmentation; deeper stages of sleep were identified by K complexes and sleep spindles. There was no focal slowing. There were no interictal epileptiform discharges. There were no electrographic seizures identified. There was no abnormal response to photic stimulation. Hyperventilation was not performed. Impression and clinical correlation: This EEG was obtained while awake and asleep and is abnormal due to mild diffuse slowing. There were no epileptiform abnl observed during this recording. Su Monks, MD [facility] [phone] If 7pm- 7am, please page neurology on call as listed in [place].   ECHOCARDIOGRAM COMPLETE BUBBLE STUDY  Result Date: 10/25/2020    ECHOCARDIOGRAM REPORT   Patient Name:   Steve Ramirez Date of Exam: 10/24/2020 Medical Rec #:  [id number]           Height:       68.0 in Accession  #:    [id number]          Weight:       186.0 lb Date of Birth:  [birth year]          BSA:          1.982 m Patient Age:    50 years            BP:           142/84 mmHg Patient Gender: M                   HR:           60 bpm. Exam Location:  [facility] Procedure: 2D Echo, Cardiac Doppler, Color Doppler and Saline Contrast Bubble            Study Indications:    TIA G45.9  History:        Patient has prior history of Echocardiogram examinations, most                 recent 11/24/2016. CAD, TIA, Signs/Symptoms:Syncope; Risk                 Factors:Hypertension, Diabetes, Dyslipidemia and Former Smoker.  Sonographer:    Pilar Jarvis RDMS, RVT, RDCS Referring Phys: [place]  1. Left ventricular ejection fraction, by estimation, is 50 to 55%. The left ventricle has low normal function. Septal wall hypokinesis, possibly secondary to conduction abnormality.  2. There is mild left ventricular hypertrophy. Left ventricular diastolic parameters are consistent with Grade II diastolic dysfunction (pseudonormalization).  3. Right ventricular systolic function is normal. The right ventricular size is normal.  4. Left atrial size was moderately dilated.  5. The mitral valve is normal in structure. Mild mitral valve regurgitation.  6. The aortic valve is normal in structure. Aortic valve regurgitation is trivial. Mild aortic valve sclerosis is present, with no evidence of aortic valve stenosis.  7. There is borderline dilatation of the ascending aorta, measuring 37 mm.  8. Agitated saline contrast bubble study was negative, with no evidence of any interatrial shunt. FINDINGS  Left Ventricle: Left ventricular ejection fraction, by estimation, is 50 to 55%. The left ventricle has low normal function. The left ventricle has no regional wall motion abnormalities. The left ventricular internal cavity size was normal in size. There is mild left ventricular hypertrophy. Left ventricular diastolic parameters are  consistent with Grade II diastolic dysfunction (pseudonormalization). Right Ventricle: The right ventricular size is normal. No increase in right ventricular wall thickness. Right ventricular systolic function is normal. Left Atrium: Left atrial size was moderately dilated. Right Atrium: Right atrial size was normal in size. Pericardium: There is no evidence of pericardial effusion. Mitral Valve: The mitral valve is normal in structure. Mild mitral valve regurgitation. No evidence of mitral valve stenosis. Tricuspid Valve: The tricuspid valve is normal in structure. Tricuspid valve regurgitation is mild . No evidence of tricuspid stenosis. Aortic Valve: The aortic valve is normal in structure. Aortic valve regurgitation is trivial. Mild aortic valve sclerosis is present, with no evidence of aortic valve stenosis. Aortic valve mean gradient measures 5.0 mmHg. Aortic valve peak gradient measures 9.6 mmHg. Aortic valve area, by VTI measures 1.88 cm. Pulmonic Valve: The pulmonic valve was normal in structure. Pulmonic valve regurgitation is not visualized. No evidence of pulmonic stenosis. Aorta: The aortic root is normal in size and structure. There is borderline dilatation of the ascending aorta, measuring 37 mm. Venous: The inferior vena cava is normal in size with greater than 50% respiratory variability, suggesting right atrial pressure of 3 mmHg. IAS/Shunts: No atrial level shunt detected by color flow Doppler. Agitated saline contrast was given intravenously to evaluate for intracardiac shunting. Agitated saline contrast bubble study was negative, with no evidence of any interatrial shunt.  LEFT VENTRICLE PLAX 2D LVIDd:         5.40 cm      Diastology LVIDs:         4.40 cm      LV e' medial:    5.44 cm/s LV PW:         1.20 cm      LV E/e' medial:  18.9 LV IVS:        1.00 cm      LV e' lateral:   6.09 cm/s LVOT diam:     2.20 cm      LV E/e' lateral: 16.9 LV SV:         79 LV SV Index:   40 LVOT Area:     3.80  cm  LV Volumes (MOD) LV vol d, MOD A2C: 92.9 ml LV vol d, MOD A4C: 133.0 ml LV vol s,  MOD A2C: 61.8 ml LV vol s, MOD A4C: 75.5 ml LV SV MOD A2C:     31.1 ml LV SV MOD A4C:     133.0 ml LV SV MOD BP:      45.7 ml RIGHT VENTRICLE             IVC RV Basal diam:  3.30 cm     IVC diam: 2.10 cm RV S prime:     13.50 cm/s TAPSE (M-mode): 2.4 cm LEFT ATRIUM           Index        RIGHT ATRIUM           Index LA diam:      4.80 cm 2.42 cm/m   RA Area:     14.20 cm LA Vol (A4C): 81.9 ml 41.33 ml/m  RA Volume:   33.10 ml  16.70 ml/m  AORTIC VALVE                     PULMONIC VALVE AV Area (Vmax):    1.86 cm      PV Vmax:       0.72 m/s AV Area (Vmean):   1.87 cm      PV Peak grad:  2.1 mmHg AV Area (VTI):     1.88 cm AV Vmax:           155.00 cm/s AV Vmean:          108.000 cm/s AV VTI:            0.419 m AV Peak Grad:      9.6 mmHg AV Mean Grad:      5.0 mmHg LVOT Vmax:         75.70 cm/s LVOT Vmean:        53.000 cm/s LVOT VTI:          0.207 m LVOT/AV VTI ratio: 0.49  AORTA Ao Root diam: 3.50 cm Ao Asc diam:  3.70 cm Ao Arch diam: 2.6 cm MITRAL VALVE MV Area (PHT): 3.65 cm     SHUNTS MV Decel Time: 208 msec     Systemic VTI:  0.21 m MV E velocity: 103.00 cm/s  Systemic Diam: 2.20 cm MV A velocity: 91.70 cm/s MV E/A ratio:  1.12 Ida Rogue MD Electronically signed by Ida Rogue MD Signature Date/Time: 10/25/2020/6:57:43 PM    Final    CT HEAD CODE STROKE WO CONTRAST  Result Date: 11/03/2020 CLINICAL DATA:  Code stroke. Neuro deficit, acute, stroke suspected. Speech disturbance. Weakness. EXAM: CT HEAD WITHOUT CONTRAST TECHNIQUE: Contiguous axial images were obtained from the base of the skull through the vertex without intravenous contrast. COMPARISON:  MRI 10/03/2020.  CT 10/03/2020. FINDINGS: Brain: Generalized atrophy. Extensive chronic small-vessel ischemic changes of the cerebral hemispheric white matter. Old lacunar infarctions of the left thalamus and posterior limb internal capsule. No sign of  acute infarction, mass lesion, hemorrhage, hydrocephalus or extra-axial collection. Vascular: There is atherosclerotic calcification of the major vessels at the base of the brain. Skull: Negative Sinuses/Orbits: Clear Other: None ASPECTS ([place] Stroke Program Early CT Score) - Ganglionic level infarction (caudate, lentiform nuclei, internal capsule, insula, M1-M3 cortex): 7 - Supraganglionic infarction (M4-M6 cortex): 3 Total score (0-10 with 10 being normal): 10 IMPRESSION: 1. No acute CT finding. Atrophy and extensive chronic small-vessel ischemic changes. Old lacunar infarctions left thalamus and posterior limb internal capsule. 2. ASPECTS is 10. 3. These results were called by telephone at the  time of interpretation on 11/03/2020 at 4:05 pm to provider [facility] , who verbally acknowledged these results. Electronically Signed   By: Nelson Chimes M.D.   On: 11/03/2020 16:07    Subjective: Patient was seen and examined today.  Sitting in the chair and asking for breakfast which apparently got delayed.  No new complaints.  Ready to be discharged to rehab.  Discharge Exam: Vitals:   11/19/20 0337 11/19/20 0722  BP: 132/62 133/62  Pulse: 77 81  Resp: 16 15  Temp: 98.1 F (36.7 C) 97.9 F (36.6 C)  SpO2: 97% 98%   Vitals:   11/18/20 1922 11/18/20 2247 11/19/20 0337 11/19/20 0722  BP: (!) 138/57 122/61 132/62 133/62  Pulse: 74 78 77 81  Resp: 16 16 16 15   Temp: 98.5 F (36.9 C) (!) 97.4 F (36.3 C) 98.1 F (36.7 C) 97.9 F (36.6 C)  TempSrc:      SpO2: 97% 98% 97% 98%  Weight:      Height:        General: Pt is alert, awake, not in acute distress Cardiovascular: RRR, S1/S2 +, no rubs, no gallops Respiratory: CTA bilaterally, no wheezing, no rhonchi Abdominal: Soft, NT, ND, bowel sounds + Extremities: no edema, no cyanosis   The results of significant diagnostics from this hospitalization (including imaging, microbiology, ancillary and laboratory) are listed below for reference.     Microbiology: Recent Results (from the past 240 hour(s))  Resp Panel by RT-PCR (Flu A&B, Covid) Nasopharyngeal Swab     Status: None   Collection Time: 11/09/20  9:14 PM   Specimen: Nasopharyngeal Swab; Nasopharyngeal(NP) swabs in vial transport medium  Result Value Ref Range Status   SARS Coronavirus 2 by RT PCR NEGATIVE NEGATIVE Final    Comment: (NOTE) SARS-CoV-2 target nucleic acids are NOT DETECTED.  The SARS-CoV-2 RNA is generally detectable in upper respiratory specimens during the acute phase of infection. The lowest concentration of SARS-CoV-2 viral copies this assay can detect is 138 copies/mL. A negative result does not preclude SARS-Cov-2 infection and should not be used as the sole basis for treatment or other patient management decisions. A negative result may occur with  improper specimen collection/handling, submission of specimen other than nasopharyngeal swab, presence of viral mutation(s) within the areas targeted by this assay, and inadequate number of viral copies(<138 copies/mL). A negative result must be combined with clinical observations, patient history, and epidemiological information. The expected result is Negative.  Fact Sheet for Patients:  EntrepreneurPulse.com.au  Fact Sheet for Healthcare Providers:  IncredibleEmployment.be  This test is no t yet approved or cleared by the [place] FDA and  has been authorized for detection and/or diagnosis of SARS-CoV-2 by FDA under an Emergency Use Authorization (EUA). This EUA will remain  in effect (meaning this test can be used) for the duration of the COVID-19 declaration under Section 564(b)(1) of the Act, 21 U.S.C.section 360bbb-3(b)(1), unless the authorization is terminated  or revoked sooner.       Influenza A by PCR NEGATIVE NEGATIVE Final   Influenza B by PCR NEGATIVE NEGATIVE Final    Comment: (NOTE) The Xpert Xpress SARS-CoV-2/FLU/RSV plus assay is  intended as an aid in the diagnosis of influenza from Nasopharyngeal swab specimens and should not be used as a sole basis for treatment. Nasal washings and aspirates are unacceptable for Xpert Xpress SARS-CoV-2/FLU/RSV testing.  Fact Sheet for Patients: EntrepreneurPulse.com.au  Fact Sheet for Healthcare Providers: IncredibleEmployment.be  This test is not yet approved or  cleared by the [place] and has been authorized for detection and/or diagnosis of SARS-CoV-2 by FDA under an Emergency Use Authorization (EUA). This EUA will remain in effect (meaning this test can be used) for the duration of the COVID-19 declaration under Section 564(b)(1) of the Act, 21 U.S.C. section 360bbb-3(b)(1), unless the authorization is terminated or revoked.  Performed at [facility], [address] [place], [place] [address]   MRSA Next Gen by PCR, Nasal     Status: None   Collection Time: 11/12/20  7:40 PM   Specimen: Nasal Mucosa; Nasal Swab  Result Value Ref Range Status   MRSA by PCR Next Gen NOT DETECTED NOT DETECTED Final    Comment: (NOTE) The GeneXpert MRSA Assay (FDA approved for NASAL specimens only), is one component of a comprehensive MRSA colonization surveillance program. It is not intended to diagnose MRSA infection nor to guide or monitor treatment for MRSA infections. Test performance is not FDA approved in patients less than 21 years old. Performed at [facility], [address] [place], [place] [address]   Resp Panel by RT-PCR (Flu A&B, Covid) Nasopharyngeal Swab     Status: Abnormal   Collection Time: 11/14/20  4:30 PM   Specimen: Nasopharyngeal Swab; Nasopharyngeal(NP) swabs in vial transport medium  Result Value Ref Range Status   SARS Coronavirus 2 by RT PCR NEGATIVE NEGATIVE Final    Comment: (NOTE) SARS-CoV-2 target nucleic acids are NOT DETECTED.  The SARS-CoV-2 RNA is generally detectable in upper  respiratory specimens during the acute phase of infection. The lowest concentration of SARS-CoV-2 viral copies this assay can detect is 138 copies/mL. A negative result does not preclude SARS-Cov-2 infection and should not be used as the sole basis for treatment or other patient management decisions. A negative result may occur with  improper specimen collection/handling, submission of specimen other than nasopharyngeal swab, presence of viral mutation(s) within the areas targeted by this assay, and inadequate number of viral copies(<138 copies/mL). A negative result must be combined with clinical observations, patient history, and epidemiological information. The expected result is Negative.  Fact Sheet for Patients:  EntrepreneurPulse.com.au  Fact Sheet for Healthcare Providers:  IncredibleEmployment.be  This test is no t yet approved or cleared by the [place] FDA and  has been authorized for detection and/or diagnosis of SARS-CoV-2 by FDA under an Emergency Use Authorization (EUA). This EUA will remain  in effect (meaning this test can be used) for the duration of the COVID-19 declaration under Section 564(b)(1) of the Act, 21 U.S.C.section 360bbb-3(b)(1), unless the authorization is terminated  or revoked sooner.       Influenza A by PCR POSITIVE (A) NEGATIVE Final   Influenza B by PCR NEGATIVE NEGATIVE Final    Comment: (NOTE) The Xpert Xpress SARS-CoV-2/FLU/RSV plus assay is intended as an aid in the diagnosis of influenza from Nasopharyngeal swab specimens and should not be used as a sole basis for treatment. Nasal washings and aspirates are unacceptable for Xpert Xpress SARS-CoV-2/FLU/RSV testing.  Fact Sheet for Patients: EntrepreneurPulse.com.au  Fact Sheet for Healthcare Providers: IncredibleEmployment.be  This test is not yet approved or cleared by the [place] FDA and has been  authorized for detection and/or diagnosis of SARS-CoV-2 by FDA under an Emergency Use Authorization (EUA). This EUA will remain in effect (meaning this test can be used) for the duration of the COVID-19 declaration under Section 564(b)(1) of the Act, 21 U.S.C. section 360bbb-3(b)(1), unless the authorization is terminated or  revoked.  Performed at [facility], [address] [place], [place] [address]      Labs: BNP (last 3 results) No results for input(s): BNP in the last 8760 hours. Basic Metabolic Panel: Recent Labs  Lab 11/15/20 0546 11/16/20 0614  NA 139  --   K 3.7  --   CL 105  --   CO2 24  --   GLUCOSE 147*  --   BUN 19  --   CREATININE 0.58* 0.51*  CALCIUM 8.7*  --   MG 1.8  --   PHOS 3.1  --    Liver Function Tests: No results for input(s): AST, ALT, ALKPHOS, BILITOT, PROT, ALBUMIN in the last 168 hours. No results for input(s): LIPASE, AMYLASE in the last 168 hours. No results for input(s): AMMONIA in the last 168 hours. CBC: Recent Labs  Lab 11/15/20 0546  WBC 6.2  HGB 12.0*  HCT 39.3  MCV 72.6*  PLT 230   Cardiac Enzymes: No results for input(s): CKTOTAL, CKMB, CKMBINDEX, TROPONINI in the last 168 hours. BNP: Invalid input(s): POCBNP CBG: Recent Labs  Lab 11/18/20 0823 11/18/20 1224 11/18/20 1623 11/18/20 2036 11/19/20 0729  GLUCAP 136* 233* 152* 164* 142*   D-Dimer No results for input(s): DDIMER in the last 72 hours. Hgb A1c No results for input(s): HGBA1C in the last 72 hours. Lipid Profile No results for input(s): CHOL, HDL, LDLCALC, TRIG, CHOLHDL, LDLDIRECT in the last 72 hours. Thyroid function studies No results for input(s): TSH, T4TOTAL, T3FREE, THYROIDAB in the last 72 hours.  Invalid input(s): FREET3 Anemia work up No results for input(s): VITAMINB12, FOLATE, FERRITIN, TIBC, IRON, RETICCTPCT in the last 72 hours. Urinalysis    Component Value Date/Time   COLORURINE YELLOW (A) 11/10/2020 0152   APPEARANCEUR  CLEAR (A) 11/10/2020 0152   LABSPEC 1.019 11/10/2020 0152   PHURINE 5.0 11/10/2020 0152   GLUCOSEU NEGATIVE 11/10/2020 0152   HGBUR SMALL (A) 11/10/2020 0152   BILIRUBINUR NEGATIVE 11/10/2020 0152   KETONESUR NEGATIVE 11/10/2020 0152   PROTEINUR NEGATIVE 11/10/2020 0152   UROBILINOGEN 0.2 04/15/2010 1425   NITRITE NEGATIVE 11/10/2020 0152   LEUKOCYTESUR NEGATIVE 11/10/2020 0152   Sepsis Labs Invalid input(s): PROCALCITONIN,  WBC,  LACTICIDVEN Microbiology Recent Results (from the past 240 hour(s))  Resp Panel by RT-PCR (Flu A&B, Covid) Nasopharyngeal Swab     Status: None   Collection Time: 11/09/20  9:14 PM   Specimen: Nasopharyngeal Swab; Nasopharyngeal(NP) swabs in vial transport medium  Result Value Ref Range Status   SARS Coronavirus 2 by RT PCR NEGATIVE NEGATIVE Final    Comment: (NOTE) SARS-CoV-2 target nucleic acids are NOT DETECTED.  The SARS-CoV-2 RNA is generally detectable in upper respiratory specimens during the acute phase of infection. The lowest concentration of SARS-CoV-2 viral copies this assay can detect is 138 copies/mL. A negative result does not preclude SARS-Cov-2 infection and should not be used as the sole basis for treatment or other patient management decisions. A negative result may occur with  improper specimen collection/handling, submission of specimen other than nasopharyngeal swab, presence of viral mutation(s) within the areas targeted by this assay, and inadequate number of viral copies(<138 copies/mL). A negative result must be combined with clinical observations, patient history, and epidemiological information. The expected result is Negative.  Fact Sheet for Patients:  EntrepreneurPulse.com.au  Fact Sheet for Healthcare Providers:  IncredibleEmployment.be  This test is no t yet approved or cleared by the [place] and  has been authorized for  detection and/or diagnosis of SARS-CoV-2 by FDA  under an Emergency Use Authorization (EUA). This EUA will remain  in effect (meaning this test can be used) for the duration of the COVID-19 declaration under Section 564(b)(1) of the Act, 21 U.S.C.section 360bbb-3(b)(1), unless the authorization is terminated  or revoked sooner.       Influenza A by PCR NEGATIVE NEGATIVE Final   Influenza B by PCR NEGATIVE NEGATIVE Final    Comment: (NOTE) The Xpert Xpress SARS-CoV-2/FLU/RSV plus assay is intended as an aid in the diagnosis of influenza from Nasopharyngeal swab specimens and should not be used as a sole basis for treatment. Nasal washings and aspirates are unacceptable for Xpert Xpress SARS-CoV-2/FLU/RSV testing.  Fact Sheet for Patients: EntrepreneurPulse.com.au  Fact Sheet for Healthcare Providers: IncredibleEmployment.be  This test is not yet approved or cleared by the [place] FDA and has been authorized for detection and/or diagnosis of SARS-CoV-2 by FDA under an Emergency Use Authorization (EUA). This EUA will remain in effect (meaning this test can be used) for the duration of the COVID-19 declaration under Section 564(b)(1) of the Act, 21 U.S.C. section 360bbb-3(b)(1), unless the authorization is terminated or revoked.  Performed at [facility], [address] [place], [place] [address]   MRSA Next Gen by PCR, Nasal     Status: None   Collection Time: 11/12/20  7:40 PM   Specimen: Nasal Mucosa; Nasal Swab  Result Value Ref Range Status   MRSA by PCR Next Gen NOT DETECTED NOT DETECTED Final    Comment: (NOTE) The GeneXpert MRSA Assay (FDA approved for NASAL specimens only), is one component of a comprehensive MRSA colonization surveillance program. It is not intended to diagnose MRSA infection nor to guide or monitor treatment for MRSA infections. Test performance is not FDA approved in patients less than 41 years old. Performed at [facility], [address] [place], [place] [address]   Resp Panel by RT-PCR (Flu A&B, Covid) Nasopharyngeal Swab     Status: Abnormal   Collection Time: 11/14/20  4:30 PM   Specimen: Nasopharyngeal Swab; Nasopharyngeal(NP) swabs in vial transport medium  Result Value Ref Range Status   SARS Coronavirus 2 by RT PCR NEGATIVE NEGATIVE Final    Comment: (NOTE) SARS-CoV-2 target nucleic acids are NOT DETECTED.  The SARS-CoV-2 RNA is generally detectable in upper respiratory specimens during the acute phase of infection. The lowest concentration of SARS-CoV-2 viral copies this assay can detect is 138 copies/mL. A negative result does not preclude SARS-Cov-2 infection and should not be used as the sole basis for treatment or other patient management decisions. A negative result may occur with  improper specimen collection/handling, submission of specimen other than nasopharyngeal swab, presence of viral mutation(s) within the areas targeted by this assay, and inadequate number of viral copies(<138 copies/mL). A negative result must be combined with clinical observations, patient history, and epidemiological information. The expected result is Negative.  Fact Sheet for Patients:  EntrepreneurPulse.com.au  Fact Sheet for Healthcare Providers:  IncredibleEmployment.be  This test is no t yet approved or cleared by the [place] FDA and  has been authorized for detection and/or diagnosis of SARS-CoV-2 by FDA under an Emergency Use Authorization (EUA). This EUA will remain  in effect (meaning this test can be used) for the duration of the COVID-19 declaration under Section 564(b)(1) of the Act, 21 U.S.C.section 360bbb-3(b)(1), unless the authorization is terminated  or revoked sooner.       Influenza A by  PCR POSITIVE (A) NEGATIVE Final   Influenza B by PCR NEGATIVE NEGATIVE Final    Comment: (NOTE) The Xpert Xpress SARS-CoV-2/FLU/RSV plus assay is intended as  an aid in the diagnosis of influenza from Nasopharyngeal swab specimens and should not be used as a sole basis for treatment. Nasal washings and aspirates are unacceptable for Xpert Xpress SARS-CoV-2/FLU/RSV testing.  Fact Sheet for Patients: EntrepreneurPulse.com.au  Fact Sheet for Healthcare Providers: IncredibleEmployment.be  This test is not yet approved or cleared by the [place] FDA and has been authorized for detection and/or diagnosis of SARS-CoV-2 by FDA under an Emergency Use Authorization (EUA). This EUA will remain in effect (meaning this test can be used) for the duration of the COVID-19 declaration under Section 564(b)(1) of the Act, 21 U.S.C. section 360bbb-3(b)(1), unless the authorization is terminated or revoked.  Performed at [facility], [address] [place], [place] [address]     Time coordinating discharge: Over 30 minutes  SIGNED:  Lorella Nimrod, MD  [facility] 11/19/2020, 9:30 AM  If 7PM-7AM, please contact night-coverage [email or web address]  This record has been created using Systems analyst. Errors have been sought and corrected,but may not always be located. Such creation errors do not reflect on the standard of care.

## 2020-11-19 NOTE — TOC Progression Note (Addendum)
Transition of Care [facility]) - Progression Note    Patient Details  Name: Steve Ramirez MRN: [id number] Date of Birth: 03/31/1935  [facility]) CM/SW [place], RN Phone Number: 11/19/2020, 10:02 AM  Clinical Narrative:   Spoke to the patient's daughter [name] on the phone, she will no be able to transport to [facility] She requested that he get EMS transport to [facility],   Covid test back and is negative He will go to room 607A  Called EMS to transport, the patient is 3rd on list Spoke to [name] his daughter and notified of the transport  Expected Discharge Plan: [place] Barriers to Discharge: Continued Medical Work up  Expected Discharge Plan and Services Expected Discharge Plan: [place] arrangements for the past 2 months: [place]) Expected Discharge Date: 11/19/20                                     Social Determinants of Health (SDOH) Interventions    Readmission Risk Interventions No flowsheet data found.

## 2020-12-01 ENCOUNTER — Other Ambulatory Visit: Payer: Self-pay

## 2020-12-01 ENCOUNTER — Emergency Department: Payer: Medicare Other

## 2020-12-01 ENCOUNTER — Inpatient Hospital Stay
Admission: EM | Admit: 2020-12-01 | Discharge: 2020-12-03 | DRG: 194 | Disposition: A | Discharge status: Home / Self Care | Payer: Medicare Other | Attending: Internal Medicine | Admitting: Internal Medicine

## 2020-12-01 DIAGNOSIS — A419 Sepsis, unspecified organism: Secondary | ICD-10-CM | POA: Diagnosis present

## 2020-12-01 DIAGNOSIS — E872 Acidosis, unspecified: Secondary | ICD-10-CM | POA: Diagnosis present

## 2020-12-01 DIAGNOSIS — I4510 Unspecified right bundle-branch block: Secondary | ICD-10-CM | POA: Diagnosis present

## 2020-12-01 DIAGNOSIS — I2510 Atherosclerotic heart disease of native coronary artery without angina pectoris: Secondary | ICD-10-CM | POA: Diagnosis present

## 2020-12-01 DIAGNOSIS — E039 Hypothyroidism, unspecified: Secondary | ICD-10-CM | POA: Diagnosis present

## 2020-12-01 DIAGNOSIS — Z8673 Personal history of transient ischemic attack (TIA), and cerebral infarction without residual deficits: Secondary | ICD-10-CM

## 2020-12-01 DIAGNOSIS — N179 Acute kidney failure, unspecified: Secondary | ICD-10-CM | POA: Diagnosis present

## 2020-12-01 DIAGNOSIS — E861 Hypovolemia: Secondary | ICD-10-CM | POA: Diagnosis present

## 2020-12-01 DIAGNOSIS — I959 Hypotension, unspecified: Secondary | ICD-10-CM | POA: Diagnosis present

## 2020-12-01 DIAGNOSIS — I10 Essential (primary) hypertension: Secondary | ICD-10-CM | POA: Diagnosis present

## 2020-12-01 DIAGNOSIS — Z87891 Personal history of nicotine dependence: Secondary | ICD-10-CM

## 2020-12-01 DIAGNOSIS — Z981 Arthrodesis status: Secondary | ICD-10-CM

## 2020-12-01 DIAGNOSIS — Z79890 Hormone replacement therapy (postmenopausal): Secondary | ICD-10-CM

## 2020-12-01 DIAGNOSIS — Z20822 Contact with and (suspected) exposure to covid-19: Secondary | ICD-10-CM | POA: Diagnosis present

## 2020-12-01 DIAGNOSIS — Z8249 Family history of ischemic heart disease and other diseases of the circulatory system: Secondary | ICD-10-CM

## 2020-12-01 DIAGNOSIS — J189 Pneumonia, unspecified organism: Principal | ICD-10-CM | POA: Diagnosis present

## 2020-12-01 DIAGNOSIS — E785 Hyperlipidemia, unspecified: Secondary | ICD-10-CM | POA: Diagnosis present

## 2020-12-01 DIAGNOSIS — Z7984 Long term (current) use of oral hypoglycemic drugs: Secondary | ICD-10-CM

## 2020-12-01 DIAGNOSIS — R4182 Altered mental status, unspecified: Secondary | ICD-10-CM

## 2020-12-01 DIAGNOSIS — Z7982 Long term (current) use of aspirin: Secondary | ICD-10-CM

## 2020-12-01 DIAGNOSIS — Z7902 Long term (current) use of antithrombotics/antiplatelets: Secondary | ICD-10-CM

## 2020-12-01 DIAGNOSIS — Z85820 Personal history of malignant melanoma of skin: Secondary | ICD-10-CM

## 2020-12-01 DIAGNOSIS — E119 Type 2 diabetes mellitus without complications: Secondary | ICD-10-CM | POA: Diagnosis present

## 2020-12-01 DIAGNOSIS — E875 Hyperkalemia: Secondary | ICD-10-CM | POA: Diagnosis present

## 2020-12-01 DIAGNOSIS — Z79899 Other long term (current) drug therapy: Secondary | ICD-10-CM

## 2020-12-01 DIAGNOSIS — K5730 Diverticulosis of large intestine without perforation or abscess without bleeding: Secondary | ICD-10-CM | POA: Diagnosis present

## 2020-12-01 DIAGNOSIS — E860 Dehydration: Secondary | ICD-10-CM | POA: Diagnosis present

## 2020-12-01 LAB — CBC WITH DIFFERENTIAL/PLATELET
Abs Immature Granulocytes: 0.04 10*3/uL (ref 0.00–0.07)
Basophils Absolute: 0.1 10*3/uL (ref 0.0–0.1)
Basophils Relative: 0 %
Eosinophils Absolute: 0.1 10*3/uL (ref 0.0–0.5)
Eosinophils Relative: 1 %
HCT: 40.3 % (ref 39.0–52.0)
Hemoglobin: 12.3 g/dL — ABNORMAL LOW (ref 13.0–17.0)
Immature Granulocytes: 0 %
Lymphocytes Relative: 8 %
Lymphs Abs: 1 10*3/uL (ref 0.7–4.0)
MCH: 22.9 pg — ABNORMAL LOW (ref 26.0–34.0)
MCHC: 30.5 g/dL (ref 30.0–36.0)
MCV: 74.9 fL — ABNORMAL LOW (ref 80.0–100.0)
Monocytes Absolute: 1 10*3/uL (ref 0.1–1.0)
Monocytes Relative: 8 %
Neutro Abs: 10.4 10*3/uL — ABNORMAL HIGH (ref 1.7–7.7)
Neutrophils Relative %: 83 %
Platelets: 356 10*3/uL (ref 150–400)
RBC: 5.38 MIL/uL (ref 4.22–5.81)
RDW: 19.6 % — ABNORMAL HIGH (ref 11.5–15.5)
WBC: 12.5 10*3/uL — ABNORMAL HIGH (ref 4.0–10.5)
nRBC: 0 % (ref 0.0–0.2)

## 2020-12-01 MED ORDER — LACTATED RINGERS IV BOLUS
1000.0000 mL | Freq: Once | INTRAVENOUS | Status: AC
  Administered 2020-12-01: 1000 mL via INTRAVENOUS

## 2020-12-01 NOTE — ED Provider Notes (Signed)
[facility] Emergency Department Provider Note   ____________________________________________   Event Date/Time   First MD Initiated Contact with Patient 12/01/20 2219     (approximate)  I have reviewed the triage vital signs and the nursing notes.   HISTORY  Chief Complaint Altered Mental Status    HPI Steve Ramirez is a 85 y.o. male with past medical history of hypertension, hyperlipidemia, diabetes, CAD, and stroke who presents to the ED for altered mental status.  Staff at patient's nursing facility noticed that he was less responsive than usual just prior to arrival.  It is unclear when patient was last seen well, but EMS reports that patient seemed to wake up once he he was moved over to their stretcher.  Patient is arousable to voice, currently denies any complaints and states he feels well.  There was apparently concern for hypoglycemia at his facility and he was given orange juice, however blood sugar was 206 when EMS checked.        Past Medical History:  Diagnosis Date   Coronary atherosclerosis of native coronary artery    Essential hypertension, benign    GIB (gastrointestinal bleeding)    Colonic diverticular bleed   History of melanoma    History of TIAs    Stroke [facility])    Type 2 diabetes mellitus ([place])     Patient Active Problem List   Diagnosis Date Noted   Pressure injury of skin 11/12/2020   AMS (altered mental status) 11/11/2020   Altered mental status 11/09/2020   Hypertensive urgency 11/09/2020   Depression 10/03/2020   Hypothyroidism 10/03/2020   CAD (coronary artery disease) 66/44/0347   Acute metabolic encephalopathy 42/59/5638   Primary osteoarthritis of left hip 09/14/2018   Primary localized osteoarthritis of hip 08/02/2018   Primary osteoarthritis of right hip 07/07/2018   Near syncope 12/03/2016   CVA (cerebral vascular accident) ([place]) 12/03/2016   Right hemiparesis ([place]) 12/02/2016   Acute ischemic  stroke ([place]) 12/02/2016   Essential hypertension 12/02/2016   Dysphagia 12/02/2016   Acute CVA (cerebrovascular accident) ([place]) 09/13/2015   TIA (transient ischemic attack) 11/29/2014   Shoulder stiffness 09/05/2012   Right rotator cuff tear 08/16/2012   Shoulder subluxation, right 08/09/2012   Elevated transaminase level 11/05/2011   Coronary atherosclerosis of native coronary artery 11/26/2010   Essential hypertension, benign 11/23/2010   Diabetes mellitus without complication ([place] ) 75/64/3329   Hyperlipidemia 11/23/2010   History of TIAs 11/23/2010    Past Surgical History:  Procedure Laterality Date   COLONOSCOPY  08/13/2010   Procedure: COLONOSCOPY;  Surgeon: Rogene Houston, MD;  Location: [facility]O [facility];  Service: Endoscopy;  Laterality: N/A;   JOINT REPLACEMENT     KNEE SURGERY     arthroscopic   LEFT HEART CATHETERIZATION WITH CORONARY ANGIOGRAM N/A 11/24/2010   Procedure: LEFT HEART CATHETERIZATION WITH CORONARY ANGIOGRAM;  Surgeon: Josue Hector, MD;  Location: [facility] CATH LAB;  Service: Cardiovascular;  Laterality: N/A;   neck fusion     years ago   TOTAL HIP ARTHROPLASTY Right 08/02/2018   Procedure: TOTAL HIP ARTHROPLASTY ANTERIOR APPROACH;  Surgeon: Renette Butters, MD;  Location: [facility] ORS;  Service: Orthopedics;  Laterality: Right;   TOTAL HIP ARTHROPLASTY Left 10/18/2018   Procedure: TOTAL HIP ARTHROPLASTY ANTERIOR APPROACH;  Surgeon: Renette Butters, MD;  Location: [facility] ORS;  Service: Orthopedics;  Laterality: Left;    Prior to Admission medications   Medication Sig Start Date End Date Taking? Authorizing Provider  acetaminophen (TYLENOL) 325 MG tablet Take 2 tablets (650 mg total) by mouth every 4 (four) hours as needed for mild pain (or temp > 37.5 C (99.5 F)). 11/19/20   Lorella Nimrod, MD  amLODipine (NORVASC) 10 MG tablet Take 1 tablet (10 mg total) by mouth daily. 11/19/20   Lorella Nimrod, MD  aspirin EC 81 MG tablet Take 1 tablet (81 mg total) by mouth 2 (two) times  daily. For DVT prophylaxis for 30 days after surgery. 10/18/18   Prudencio Burly III, PA-C  cetirizine (ZYRTEC) 10 MG tablet Take 10 mg by mouth daily.    [provider]  cloNIDine (CATAPRES - DOSED IN MG/24 HR) 0.3 mg/24hr patch Place 1 patch (0.3 mg total) onto the skin once a week. Patient taking differently: Place 0.3 mg onto the skin every Saturday. 05/26/17   Caren Macadam, MD  clopidogrel (PLAVIX) 75 MG tablet Take 1 tablet (75 mg total) by mouth daily. 06/10/17   Caren Macadam, MD  ferrous sulfate 325 (65 FE) MG tablet Take 325 mg by mouth daily with breakfast.    [provider]  ipratropium-albuterol (DUONEB) 0.5-2.5 (3) MG/3ML SOLN Take 3 mLs by nebulization every 4 (four) hours as needed. 11/19/20   Lorella Nimrod, MD  isosorbide mononitrate (IMDUR) 30 MG 24 hr tablet Take 30 mg by mouth every evening.  11/30/16   [provider]  levothyroxine (SYNTHROID, LEVOTHROID) 50 MCG tablet Take 1 tablet (50 mcg total) by mouth daily. 06/10/17   Caren Macadam, MD  lisinopril (PRINIVIL,ZESTRIL) 40 MG tablet Take 1 tablet (40 mg total) by mouth daily. Patient taking differently: Take 40 mg by mouth at bedtime. 04/01/17   Caren Macadam, MD  metFORMIN (GLUCOPHAGE) 1000 MG tablet Take 1,000 mg by mouth 2 (two) times daily. 10/21/20   [provider]  metoprolol succinate (TOPROL-XL) 25 MG 24 hr tablet Take 25 mg by mouth every evening.     [provider]  Multiple Vitamin (MULTIVITAMIN) tablet Take 1 tablet by mouth daily.      [provider]  nitroGLYCERIN (NITROSTAT) 0.4 MG SL tablet Place 0.4 mg under the tongue every 5 (five) minutes as needed for chest pain.    [provider]  Omega-3 Fatty Acids (FISH OIL) 1000 MG CAPS Take 1,000 mg by mouth at bedtime.    [provider]  pravastatin (PRAVACHOL) 20 MG tablet Take 20 mg by mouth at bedtime.    [provider]  rOPINIRole (REQUIP) 0.5 MG tablet Take 2  tablets (1 mg total) by mouth at bedtime. 06/10/17   Caren Macadam, MD  spironolactone (ALDACTONE) 25 MG tablet Take 1 tablet (25 mg total) by mouth daily. Patient taking differently: Take 25 mg by mouth at bedtime. 06/10/17   Caren Macadam, MD  vitamin B-12 (CYANOCOBALAMIN) 1000 MCG tablet Take 1 tablet (1,000 mcg total) by mouth daily. 11/05/20   Sharen Hones, MD  rosuvastatin (CRESTOR) 40 MG tablet Take 1 tablet (40 mg total) by mouth daily. 11/26/10 04/08/11  Theora Gianotti, NP    Allergies Patient has no known allergies.  Family History  Problem Relation Age of Onset   Coronary artery disease Father    Asthma Other    Emphysema Mother    Coronary artery disease Paternal Uncle    Brain cancer Brother     Social History Social History   Tobacco Use   Smoking status: Former    Types: Cigarettes    Quit date: 01/13/1980  Years since quitting: 40.9   Smokeless tobacco: Never  Vaping Use   Vaping Use: Never used  Substance Use Topics   Alcohol use: Yes    Comment: occasionally   Drug use: No    Review of Systems  Constitutional: No fever/chills.  Positive for confusion. Eyes: No visual changes. ENT: No sore throat. Cardiovascular: Denies chest pain. Respiratory: Denies shortness of breath. Gastrointestinal: No abdominal pain.  No nausea, no vomiting.  No diarrhea.  No constipation. Genitourinary: Negative for dysuria. Musculoskeletal: Negative for back pain. Skin: Negative for rash. Neurological: Negative for headaches, focal weakness or numbness.  ____________________________________________   PHYSICAL EXAM:  VITAL SIGNS: ED Triage Vitals  Enc Vitals Group     BP      Pulse      Resp      Temp      Temp src      SpO2      Weight      Height      Head Circumference      [place] Flow      Pain Score      Pain Loc      Pain Edu?      Excl. in [place]?     Constitutional: Alert and oriented to person, but not place or time. Eyes: Conjunctivae  are normal.  Pupils equal, round, and reactive to light bilaterally. Head: Atraumatic. Nose: No congestion/rhinnorhea. Mouth/Throat: Mucous membranes are moist. Neck: Normal ROM Cardiovascular: Normal rate, regular rhythm. Grossly normal heart sounds.  2+ radial pulses bilaterally. Respiratory: Normal respiratory effort.  No retractions. Lungs CTAB. Gastrointestinal: Soft and nontender. No distention. Genitourinary: deferred Musculoskeletal: No lower extremity tenderness nor edema. Neurologic:  Normal speech and language. No gross focal neurologic deficits are appreciated. Skin:  Skin is warm, dry and intact. No rash noted. Psychiatric: Mood and affect are normal. Speech and behavior are normal.  ____________________________________________   LABS (all labs ordered are listed, but only abnormal results are displayed)  Labs Reviewed  CBC WITH DIFFERENTIAL/PLATELET - Abnormal; Notable for the following components:      Result Value   WBC 12.5 (*)    Hemoglobin 12.3 (*)    MCV 74.9 (*)    MCH 22.9 (*)    RDW 19.6 (*)    Neutro Abs 10.4 (*)    All other components within normal limits  CULTURE, BLOOD (ROUTINE X 2)  CULTURE, BLOOD (ROUTINE X 2)  COMPREHENSIVE METABOLIC PANEL  URINALYSIS, ROUTINE W REFLEX MICROSCOPIC  LACTIC ACID, PLASMA  LACTIC ACID, PLASMA  PROCALCITONIN  TROPONIN I (HIGH SENSITIVITY)  TROPONIN I (HIGH SENSITIVITY)   ____________________________________________  EKG  ED ECG REPORT I, Blake Divine, the attending physician, personally viewed and interpreted this ECG.   Date: 12/01/2020  EKG Time: 22:35  Rate: 66  Rhythm: normal sinus rhythm  Axis: Normal  Intervals:right bundle branch block and left anterior fascicular block  ST&T Change: None   PROCEDURES  Procedure(s) performed (including Critical Care):  Procedures   ____________________________________________   INITIAL IMPRESSION / ASSESSMENT AND PLAN / ED COURSE      85 year old  male with past medical history of hypertension, hyperlipidemia, diabetes, CAD, and stroke who presents to the ED due to concern for decreased responsiveness and altered mental status.  On arrival, patient is somnolent but easily arousable to voice and has no focal neurologic deficits.  He is afebrile with normal heart rate but noted to be hypotensive.  We will start sepsis work-up but  given no vital sign abnormalities outside of hypotension, hold off on broad-spectrum antibiotics for now as hypotension could be related to dehydration.  EKG shows no evidence of arrhythmia or ischemia, labs, CT head, chest x-ray, and UA are pending.  Patient turned over to oncoming divider pending lab and imaging results.      ____________________________________________   FINAL CLINICAL IMPRESSION(S) / ED DIAGNOSES  Final diagnoses:  Altered mental status, unspecified altered mental status type  Hypotension, unspecified hypotension type     ED Discharge Orders     None        Note:  This document was prepared using Dragon voice recognition software and may include unintentional dictation errors.    Blake Divine, MD 12/02/20 Dyann Kief

## 2020-12-01 NOTE — ED Triage Notes (Signed)
Pt BIB EMS for altered mental status, initially unresponsive for EMS, awakened when moved from bed to stretcher and is responsive to verbal stimuli. Patient's BG is 210. Facility unable to provide time last seen normal. MD at bedside to evaluate.

## 2020-12-02 ENCOUNTER — Other Ambulatory Visit: Payer: Self-pay

## 2020-12-02 ENCOUNTER — Encounter: Payer: Self-pay | Admitting: Family Medicine

## 2020-12-02 DIAGNOSIS — Z8673 Personal history of transient ischemic attack (TIA), and cerebral infarction without residual deficits: Secondary | ICD-10-CM | POA: Diagnosis not present

## 2020-12-02 DIAGNOSIS — Z981 Arthrodesis status: Secondary | ICD-10-CM | POA: Diagnosis not present

## 2020-12-02 DIAGNOSIS — J189 Pneumonia, unspecified organism: Secondary | ICD-10-CM | POA: Diagnosis present

## 2020-12-02 DIAGNOSIS — N179 Acute kidney failure, unspecified: Secondary | ICD-10-CM

## 2020-12-02 DIAGNOSIS — E875 Hyperkalemia: Secondary | ICD-10-CM | POA: Diagnosis present

## 2020-12-02 DIAGNOSIS — I959 Hypotension, unspecified: Secondary | ICD-10-CM

## 2020-12-02 DIAGNOSIS — Z7984 Long term (current) use of oral hypoglycemic drugs: Secondary | ICD-10-CM | POA: Diagnosis not present

## 2020-12-02 DIAGNOSIS — Z79899 Other long term (current) drug therapy: Secondary | ICD-10-CM | POA: Diagnosis not present

## 2020-12-02 DIAGNOSIS — E785 Hyperlipidemia, unspecified: Secondary | ICD-10-CM | POA: Diagnosis present

## 2020-12-02 DIAGNOSIS — Z20822 Contact with and (suspected) exposure to covid-19: Secondary | ICD-10-CM | POA: Diagnosis present

## 2020-12-02 DIAGNOSIS — I4510 Unspecified right bundle-branch block: Secondary | ICD-10-CM | POA: Diagnosis present

## 2020-12-02 DIAGNOSIS — A419 Sepsis, unspecified organism: Secondary | ICD-10-CM | POA: Diagnosis not present

## 2020-12-02 DIAGNOSIS — Z79890 Hormone replacement therapy (postmenopausal): Secondary | ICD-10-CM | POA: Diagnosis not present

## 2020-12-02 DIAGNOSIS — K5730 Diverticulosis of large intestine without perforation or abscess without bleeding: Secondary | ICD-10-CM | POA: Diagnosis present

## 2020-12-02 DIAGNOSIS — Z7902 Long term (current) use of antithrombotics/antiplatelets: Secondary | ICD-10-CM | POA: Diagnosis not present

## 2020-12-02 DIAGNOSIS — Z85820 Personal history of malignant melanoma of skin: Secondary | ICD-10-CM | POA: Diagnosis not present

## 2020-12-02 DIAGNOSIS — Z7982 Long term (current) use of aspirin: Secondary | ICD-10-CM | POA: Diagnosis not present

## 2020-12-02 DIAGNOSIS — Z87891 Personal history of nicotine dependence: Secondary | ICD-10-CM | POA: Diagnosis not present

## 2020-12-02 DIAGNOSIS — E039 Hypothyroidism, unspecified: Secondary | ICD-10-CM | POA: Diagnosis present

## 2020-12-02 DIAGNOSIS — E872 Acidosis, unspecified: Secondary | ICD-10-CM

## 2020-12-02 DIAGNOSIS — I2510 Atherosclerotic heart disease of native coronary artery without angina pectoris: Secondary | ICD-10-CM | POA: Diagnosis present

## 2020-12-02 DIAGNOSIS — E119 Type 2 diabetes mellitus without complications: Secondary | ICD-10-CM | POA: Diagnosis present

## 2020-12-02 DIAGNOSIS — I10 Essential (primary) hypertension: Secondary | ICD-10-CM | POA: Diagnosis present

## 2020-12-02 DIAGNOSIS — E861 Hypovolemia: Secondary | ICD-10-CM | POA: Diagnosis present

## 2020-12-02 DIAGNOSIS — E860 Dehydration: Secondary | ICD-10-CM | POA: Diagnosis present

## 2020-12-02 LAB — COMPREHENSIVE METABOLIC PANEL
ALT: 14 U/L (ref 0–44)
AST: 17 U/L (ref 15–41)
Albumin: 4 g/dL (ref 3.5–5.0)
Alkaline Phosphatase: 62 U/L (ref 38–126)
Anion gap: 10 (ref 5–15)
BUN: 37 mg/dL — ABNORMAL HIGH (ref 8–23)
CO2: 20 mmol/L — ABNORMAL LOW (ref 22–32)
Calcium: 9.3 mg/dL (ref 8.9–10.3)
Chloride: 105 mmol/L (ref 98–111)
Creatinine, Ser: 1.39 mg/dL — ABNORMAL HIGH (ref 0.61–1.24)
GFR, Estimated: 50 mL/min — ABNORMAL LOW (ref >60)
Glucose, Bld: 157 mg/dL — ABNORMAL HIGH (ref 70–99)
Potassium: 5.8 mmol/L — ABNORMAL HIGH (ref 3.5–5.1)
Sodium: 135 mmol/L (ref 135–145)
Total Bilirubin: 0.5 mg/dL (ref 0.3–1.2)
Total Protein: 7.3 g/dL (ref 6.5–8.1)

## 2020-12-02 LAB — RESP PANEL BY RT-PCR (FLU A&B, COVID) ARPGX2
Influenza A by PCR: NEGATIVE
Influenza B by PCR: NEGATIVE
SARS Coronavirus 2 by RT PCR: NEGATIVE

## 2020-12-02 LAB — BASIC METABOLIC PANEL
Anion gap: 5 (ref 5–15)
BUN: 30 mg/dL — ABNORMAL HIGH (ref 8–23)
CO2: 23 mmol/L (ref 22–32)
Calcium: 8.5 mg/dL — ABNORMAL LOW (ref 8.9–10.3)
Chloride: 106 mmol/L (ref 98–111)
Creatinine, Ser: 0.99 mg/dL (ref 0.61–1.24)
GFR, Estimated: >60 mL/min (ref >60)
Glucose, Bld: 122 mg/dL — ABNORMAL HIGH (ref 70–99)
Potassium: 4.5 mmol/L (ref 3.5–5.1)
Sodium: 134 mmol/L — ABNORMAL LOW (ref 135–145)

## 2020-12-02 LAB — CBC
HCT: 34.5 % — ABNORMAL LOW (ref 39.0–52.0)
Hemoglobin: 10.8 g/dL — ABNORMAL LOW (ref 13.0–17.0)
MCH: 23.2 pg — ABNORMAL LOW (ref 26.0–34.0)
MCHC: 31.3 g/dL (ref 30.0–36.0)
MCV: 74 fL — ABNORMAL LOW (ref 80.0–100.0)
Platelets: 307 10*3/uL (ref 150–400)
RBC: 4.66 MIL/uL (ref 4.22–5.81)
RDW: 19.2 % — ABNORMAL HIGH (ref 11.5–15.5)
WBC: 9.7 10*3/uL (ref 4.0–10.5)
nRBC: 0 % (ref 0.0–0.2)

## 2020-12-02 LAB — LACTIC ACID, PLASMA
Lactic Acid, Venous: 1.5 mmol/L (ref 0.5–1.9)
Lactic Acid, Venous: 2.5 mmol/L (ref 0.5–1.9)
Lactic Acid, Venous: 4.7 mmol/L (ref 0.5–1.9)
Lactic Acid, Venous: 8.3 mmol/L (ref 0.5–1.9)

## 2020-12-02 LAB — URINALYSIS, ROUTINE W REFLEX MICROSCOPIC
Bilirubin Urine: NEGATIVE
Glucose, UA: NEGATIVE mg/dL
Hgb urine dipstick: NEGATIVE
Ketones, ur: NEGATIVE mg/dL
Leukocytes,Ua: NEGATIVE
Nitrite: NEGATIVE
Protein, ur: NEGATIVE mg/dL
Specific Gravity, Urine: 1.017 (ref 1.005–1.030)
pH: 5 (ref 5.0–8.0)

## 2020-12-02 LAB — STREP PNEUMONIAE URINARY ANTIGEN: Strep Pneumo Urinary Antigen: NEGATIVE

## 2020-12-02 LAB — TROPONIN I (HIGH SENSITIVITY)
Troponin I (High Sensitivity): 10 ng/L (ref <18)
Troponin I (High Sensitivity): 12 ng/L (ref <18)

## 2020-12-02 LAB — PROCALCITONIN: Procalcitonin: 1.25 ng/mL

## 2020-12-02 MED ORDER — VANCOMYCIN HCL 2000 MG/400ML IV SOLN: 2000.0000 mg | Freq: Once | INTRAVENOUS | Status: DC

## 2020-12-02 MED ORDER — ASPIRIN EC 81 MG PO TBEC: 81.0000 mg | DELAYED_RELEASE_TABLET | Freq: Two times a day (BID) | ORAL | Status: DC

## 2020-12-02 MED ORDER — ACETAMINOPHEN 325 MG PO TABS: 650.0000 mg | ORAL_TABLET | Freq: Four times a day (QID) | ORAL | Status: DC | PRN

## 2020-12-02 MED ORDER — NITROGLYCERIN 0.4 MG SL SUBL: 0.4000 mg | SUBLINGUAL_TABLET | SUBLINGUAL | Status: DC | PRN

## 2020-12-02 MED ORDER — TRAZODONE HCL 50 MG PO TABS: 25.0000 mg | ORAL_TABLET | Freq: Every evening | ORAL | Status: DC | PRN

## 2020-12-02 MED ORDER — ISOSORBIDE MONONITRATE ER 30 MG PO TB24
30.0000 mg | ORAL_TABLET | Freq: Every evening | ORAL | Status: DC
  Administered 2020-12-02: 30 mg via ORAL
  Filled 2020-12-02: qty 1

## 2020-12-02 MED ORDER — FERROUS SULFATE 325 (65 FE) MG PO TABS
325.0000 mg | ORAL_TABLET | Freq: Every day | ORAL | Status: DC
  Administered 2020-12-02 – 2020-12-03 (×2): 325 mg via ORAL
  Filled 2020-12-02 (×2): qty 1

## 2020-12-02 MED ORDER — VITAMIN B-12 1000 MCG PO TABS
1000.0000 ug | ORAL_TABLET | Freq: Every day | ORAL | Status: DC
  Administered 2020-12-02 – 2020-12-03 (×2): 1000 ug via ORAL
  Filled 2020-12-02 (×2): qty 1

## 2020-12-02 MED ORDER — PRAVASTATIN SODIUM 20 MG PO TABS
20.0000 mg | ORAL_TABLET | Freq: Every day | ORAL | Status: DC
  Administered 2020-12-02: 20 mg via ORAL
  Filled 2020-12-02: qty 1

## 2020-12-02 MED ORDER — IPRATROPIUM-ALBUTEROL 0.5-2.5 (3) MG/3ML IN SOLN
3.0000 mL | Freq: Four times a day (QID) | RESPIRATORY_TRACT | Status: DC
  Administered 2020-12-02 – 2020-12-03 (×5): 3 mL via RESPIRATORY_TRACT
  Filled 2020-12-02 (×5): qty 3

## 2020-12-02 MED ORDER — METRONIDAZOLE 500 MG/100ML IV SOLN: 500.0000 mg | Freq: Once | INTRAVENOUS | Status: DC

## 2020-12-02 MED ORDER — SODIUM CHLORIDE 0.9 % BOLUS PEDS: 1000.0000 mL | Freq: Once | INTRAVENOUS | Status: DC

## 2020-12-02 MED ORDER — METOPROLOL SUCCINATE ER 25 MG PO TB24
25.0000 mg | ORAL_TABLET | Freq: Every evening | ORAL | Status: DC
  Administered 2020-12-02: 25 mg via ORAL
  Filled 2020-12-02: qty 1

## 2020-12-02 MED ORDER — MAGNESIUM HYDROXIDE 400 MG/5ML PO SUSP: 30.0000 mL | Freq: Every day | ORAL | Status: DC | PRN

## 2020-12-02 MED ORDER — ENOXAPARIN SODIUM 40 MG/0.4ML IJ SOSY
40.0000 mg | PREFILLED_SYRINGE | INTRAMUSCULAR | Status: DC
  Administered 2020-12-02 – 2020-12-03 (×2): 40 mg via SUBCUTANEOUS
  Filled 2020-12-02 (×2): qty 0.4

## 2020-12-02 MED ORDER — LISINOPRIL 10 MG PO TABS: 40.0000 mg | ORAL_TABLET | Freq: Every day | ORAL | Status: DC

## 2020-12-02 MED ORDER — SODIUM CHLORIDE 0.9 % IV SOLN
2.0000 g | INTRAVENOUS | Status: DC
  Administered 2020-12-02: 2 g via INTRAVENOUS
  Filled 2020-12-02 (×2): qty 20

## 2020-12-02 MED ORDER — GUAIFENESIN ER 600 MG PO TB12
600.0000 mg | ORAL_TABLET | Freq: Two times a day (BID) | ORAL | Status: DC
  Administered 2020-12-02 – 2020-12-03 (×4): 600 mg via ORAL
  Filled 2020-12-02 (×4): qty 1

## 2020-12-02 MED ORDER — SPIRONOLACTONE 25 MG PO TABS: 25.0000 mg | ORAL_TABLET | Freq: Every day | ORAL | Status: DC

## 2020-12-02 MED ORDER — SODIUM CHLORIDE 0.9 % IV SOLN
500.0000 mg | Freq: Once | INTRAVENOUS | Status: AC
  Administered 2020-12-02: 500 mg via INTRAVENOUS
  Filled 2020-12-02: qty 500

## 2020-12-02 MED ORDER — ASPIRIN EC 81 MG PO TBEC
81.0000 mg | DELAYED_RELEASE_TABLET | Freq: Every day | ORAL | Status: DC
  Administered 2020-12-02 – 2020-12-03 (×2): 81 mg via ORAL
  Filled 2020-12-02 (×2): qty 1

## 2020-12-02 MED ORDER — VANCOMYCIN HCL IN DEXTROSE 1-5 GM/200ML-% IV SOLN: 1000.0000 mg | Freq: Once | INTRAVENOUS | Status: DC

## 2020-12-02 MED ORDER — SODIUM CHLORIDE 0.9 % IV SOLN
2.0000 g | Freq: Once | INTRAVENOUS | Status: AC
  Administered 2020-12-02: 2 g via INTRAVENOUS
  Filled 2020-12-02: qty 20

## 2020-12-02 MED ORDER — VANCOMYCIN HCL 2000 MG/400ML IV SOLN
2000.0000 mg | Freq: Once | INTRAVENOUS | Status: AC
  Administered 2020-12-02: 2000 mg via INTRAVENOUS
  Filled 2020-12-02: qty 400

## 2020-12-02 MED ORDER — LACTATED RINGERS IV BOLUS
1500.0000 mL | Freq: Once | INTRAVENOUS | Status: AC
  Administered 2020-12-02: 1500 mL via INTRAVENOUS

## 2020-12-02 MED ORDER — SODIUM CHLORIDE 0.9 % IV BOLUS: 500.0000 mL | Freq: Once | INTRAVENOUS | Status: DC

## 2020-12-02 MED ORDER — ACETAMINOPHEN 650 MG RE SUPP: 650.0000 mg | Freq: Four times a day (QID) | RECTAL | Status: DC | PRN

## 2020-12-02 MED ORDER — LORATADINE 10 MG PO TABS
10.0000 mg | ORAL_TABLET | Freq: Every day | ORAL | Status: DC
  Administered 2020-12-02 – 2020-12-03 (×2): 10 mg via ORAL
  Filled 2020-12-02 (×2): qty 1

## 2020-12-02 MED ORDER — CLOPIDOGREL BISULFATE 75 MG PO TABS
75.0000 mg | ORAL_TABLET | Freq: Every day | ORAL | Status: DC
  Administered 2020-12-02 – 2020-12-03 (×2): 75 mg via ORAL
  Filled 2020-12-02 (×2): qty 1

## 2020-12-02 MED ORDER — AMLODIPINE BESYLATE 10 MG PO TABS
10.0000 mg | ORAL_TABLET | Freq: Every day | ORAL | Status: DC
  Administered 2020-12-02 – 2020-12-03 (×2): 10 mg via ORAL
  Filled 2020-12-02: qty 2
  Filled 2020-12-02: qty 1

## 2020-12-02 MED ORDER — LEVOTHYROXINE SODIUM 50 MCG PO TABS
50.0000 ug | ORAL_TABLET | Freq: Every day | ORAL | Status: DC
  Administered 2020-12-02 – 2020-12-03 (×2): 50 ug via ORAL
  Filled 2020-12-02 (×2): qty 1

## 2020-12-02 MED ORDER — ROPINIROLE HCL 1 MG PO TABS
1.0000 mg | ORAL_TABLET | Freq: Every day | ORAL | Status: DC
  Administered 2020-12-02: 1 mg via ORAL
  Filled 2020-12-02 (×2): qty 1

## 2020-12-02 MED ORDER — SODIUM CHLORIDE 0.9 % IV SOLN: INTRAVENOUS | Status: DC

## 2020-12-02 MED ORDER — HYDROCOD POLST-CPM POLST ER 10-8 MG/5ML PO SUER: 5.0000 mL | Freq: Two times a day (BID) | ORAL | Status: DC | PRN

## 2020-12-02 MED ORDER — ADULT MULTIVITAMIN W/MINERALS CH
1.0000 | ORAL_TABLET | Freq: Every day | ORAL | Status: DC
  Administered 2020-12-02 – 2020-12-03 (×2): 1 via ORAL
  Filled 2020-12-02 (×2): qty 1

## 2020-12-02 MED ORDER — OMEGA-3-ACID ETHYL ESTERS 1 G PO CAPS
1.0000 g | ORAL_CAPSULE | Freq: Every day | ORAL | Status: DC
  Administered 2020-12-02: 1 g via ORAL
  Filled 2020-12-02 (×2): qty 1

## 2020-12-02 MED ORDER — SODIUM CHLORIDE 0.9 % IV SOLN
500.0000 mg | INTRAVENOUS | Status: DC
  Administered 2020-12-03: 500 mg via INTRAVENOUS
  Filled 2020-12-02 (×2): qty 500

## 2020-12-02 MED ORDER — SODIUM CHLORIDE 0.9 % IV SOLN: 2.0000 g | Freq: Once | INTRAVENOUS | Status: DC

## 2020-12-02 MED ORDER — CLONIDINE HCL 0.3 MG/24HR TD PTWK: 0.3000 mg | MEDICATED_PATCH | TRANSDERMAL | Status: DC

## 2020-12-02 NOTE — Progress Notes (Signed)
CODE SEPSIS - PHARMACY COMMUNICATION  **Broad Spectrum Antibiotics should be administered within 1 hour of Sepsis diagnosis**  Time Code Sepsis Called/Page Received: 0025  Antibiotics Ordered: Ceftriaxone & Vancomycin  Time of 1st antibiotic administration: 0045  Renda Rolls, PharmD, [facility] 12/02/2020 12:21 AM

## 2020-12-02 NOTE — Progress Notes (Signed)
PHARMACY -  BRIEF ANTIBIOTIC NOTE   Pharmacy has received consult(s) for Vancomycin from an ED provider.  The patient's profile has been reviewed for ht/wt/allergies/indication/available labs.    One time order(s) placed for Vancomycin 2 gm per pt wt of 81.6 kg.  Further antibiotics/pharmacy consults should be ordered by admitting physician if indicated.                       Thank you, Renda Rolls, PharmD, [name] 12/02/2020 12:22 AM

## 2020-12-02 NOTE — TOC Progression Note (Signed)
Transition of Care [facility]) - Progression Note    Patient Details  Name: Steve Ramirez MRN: [id number] Date of Birth: [birth year]  Transition of Care [facility]) CM/SW [place], RN Phone Number: 12/02/2020, 3:39 PM  Clinical Narrative:     TOC to follow the patient and assist with DC plan   Patient had been [facility] ([facility]) does not want to go back there.   Hoping he can return to [facility] where he resides    Expected Discharge Plan and Services                                                 Social Determinants of Health (SDOH) Interventions    Readmission Risk Interventions No flowsheet data found.

## 2020-12-02 NOTE — Evaluation (Signed)
Physical Therapy Evaluation Patient Details Name: Steve Ramirez MRN: [id number] DOB: 09-27-1935 Today's Date: 12/02/2020  History of Present Illness  85 y.o. male with medical history significant for coronary artery disease, essential hypertension, TIA/CVA (multiple) and type 2 diabetes mellitus, who presented to the ER with acute onset of worsening dyspnea with associated dry cough and wheezing over the last couple of days.  He has recently been treated for confusion, metabolic encephalopathy and influenza A (d/c on 11/8).  Clinical Impression  Pt received supine in bed, daughter in room assisting pt to order dinner. Pt agreeable to therapy. He lives at [facility] with his wife, walks using Rollator and is Mod I with ADLs. Pt was able to perform all mobility including bed mobility, STS and ambulation with SUP while using RW. During ambulation, VC provided to remain within RW at all times as pt occasionally pushed it off to the side. Pt took wide left turns however did avoid obstacles. Dual tasking via conversation was performed with decrease in gait speed. Increased UE support when WB through RLE. Decreased safety awareness and redirection to task were noted on multiple occasions. Extensive pt education provided on falls prevention and use of AD as Steve Ramirez requires pt use Rollator for safety. Would benefit from skilled PT to address above deficits and promote optimal return to PLOF.       Recommendations for follow up therapy are one component of a multi-disciplinary discharge planning process, led by the attending physician.  Recommendations may be updated based on patient status, additional functional criteria and insurance authorization.  Follow Up Recommendations Home health PT    Assistance Recommended at Discharge Intermittent Supervision/Assistance  Functional Status Assessment Patient has had a recent decline in their functional status and demonstrates the ability to make  significant improvements in function in a reasonable and predictable amount of time.  Equipment Recommendations  None recommended by PT    Recommendations for Other Services       Precautions / Restrictions Precautions Precautions: Fall Restrictions Weight Bearing Restrictions: No      Mobility  Bed Mobility Overal bed mobility: Needs Assistance Bed Mobility: Supine to Sit;Sit to Supine     Supine to sit: Supervision Sit to supine: Supervision   General bed mobility comments: SUP for safety with use of bed rails.    Transfers Overall transfer level: Needs assistance Equipment used: Rolling walker (2 wheels) Transfers: Sit to/from Stand Sit to Stand: Supervision           General transfer comment: SUP for safety    Ambulation/Gait Ambulation/Gait assistance: Supervision Gait Distance (Feet): 400 Feet Assistive device: Rolling walker (2 wheels) Gait Pattern/deviations: Step-through pattern;Decreased weight shift to right Gait velocity: decreased     General Gait Details: Pt ambulates in hallway with SUP for safety. VC to remain within RW at all times as pt occasionally pushed it off to the side. Pt took wide left turns however did avoid obstacles. Dual tasking via conversation was performed with decrease in gait speed. Increased UE support when WB through RLE.  Stairs            Wheelchair Mobility    Modified Rankin (Stroke Patients Only)       Balance Overall balance assessment: Needs assistance Sitting-balance support: Feet supported;No upper extremity supported Sitting balance-Leahy Scale: Good     Standing balance support: Bilateral upper extremity supported;During functional activity Standing balance-Leahy Scale: Fair Standing balance comment: Did not rely of RW however increased trunk sway  when not using AD.                             Pertinent Vitals/Pain Pain Assessment: No/denies pain    Home Living Family/patient  expects to be discharged to:: Assisted living Living Arrangements: Spouse/significant other   Type of Home: Assisted living Home Access: Level entry       Home Layout: One level Home Equipment: Standard Walker;Rollator (4 wheels);Grab bars - toilet;Grab bars - tub/shower;Shower seat Additional Comments: Pt ambulates with Rollator at baseline.    Prior Function Prior Level of Function : Independent/Modified Independent             Mobility Comments: Per pt and family, pt ambulates using Rollator throughout ALF. Daughter endorses ~5 falls in the last 6 months however pt reports 0. Pt states he drives however this was not confirmed. ADLs Comments: Pt reports indep with ADLs. IADLs covered by ALF.     Hand Dominance   Dominant Hand: Right    Extremity/Trunk Assessment   Upper Extremity Assessment Upper Extremity Assessment: Overall WFL for tasks assessed    Lower Extremity Assessment Lower Extremity Assessment: Overall WFL for tasks assessed (mild R sided weakness compared to L.)       Communication   Communication: No difficulties  Cognition Arousal/Alertness: Awake/alert Behavior During Therapy: WFL for tasks assessed/performed Overall Cognitive Status: History of cognitive impairments - at baseline Area of Impairment: Safety/judgement                               General Comments: A&Ox4; limited safety awareness regarding mobility/fall risk/use of AD. Redirection to task/topic required on multiple occasions.        General Comments      Exercises     Assessment/Plan    PT Assessment Patient needs continued PT services  PT Problem List Decreased strength;Decreased mobility;Decreased safety awareness;Decreased cognition;Decreased activity tolerance;Decreased balance;Decreased knowledge of use of DME;Decreased knowledge of precautions       PT Treatment Interventions DME instruction;Therapeutic activities;Modalities;Cognitive remediation;Gait  training;Therapeutic exercise;Patient/family education;Balance training;Functional mobility training;Neuromuscular re-education;Manual techniques    PT Goals (Current goals can be found in the Care Plan section)  Acute Rehab PT Goals Patient Stated Goal: to go back to [facility] PT Goal Formulation: With patient Time For Goal Achievement: 12/16/20 Potential to Achieve Goals: Good    Frequency Min 2X/week   Barriers to discharge        Co-evaluation               AM-PAC PT "6 Clicks" Mobility  Outcome Measure Help needed turning from your back to your side while in a flat bed without using bedrails?: None Help needed moving from lying on your back to sitting on the side of a flat bed without using bedrails?: A Little Help needed moving to and from a bed to a chair (including a wheelchair)?: A Little Help needed standing up from a chair using your arms (e.g., wheelchair or bedside chair)?: A Little Help needed to walk in hospital room?: A Little Help needed climbing 3-5 steps with a railing? : A Little 6 Click Score: 19    End of Session Equipment Utilized During Treatment: Gait belt Activity Tolerance: Patient tolerated treatment well Patient left: in bed;with call bell/phone within reach;with bed alarm set;with family/visitor present Nurse Communication: Mobility status;Precautions PT Visit Diagnosis: Unsteadiness on feet (R26.81);Muscle weakness (generalized) (  M62.81);History of falling (Z91.81);Difficulty in walking, not elsewhere classified (R26.2);Other abnormalities of gait and mobility (R26.89)    Time: 1555-1640 PT Time Calculation (min) (ACUTE ONLY): 45 min   Charges:     PT Treatments $Therapeutic Activity: 23-37 mins        Patrina Levering PT, DPT 12/02/20 5:05 PM [phone]

## 2020-12-02 NOTE — ED Notes (Signed)
Pt in bed, pt has leaked around male purewick, changed bedding, cleaned pt, 1000 ml output from suction can, warm blankets given, pt denies pain

## 2020-12-02 NOTE — ED Provider Notes (Signed)
Patient signed out to me at 2 PM.  In brief this is an 85 year old male with some history of dementia who presents with altered mental status from the facility.  He was recently hospitalized for influenza and altered mental status.  Was treated with Tamiflu.  On arrival he is hypotensive and there is concern for sepsis.  At the time of signout he is pending his septic work-up.   Patient's labs are notable for a leukocytosis of 12 and a lactate of 4.7.  He also has an AKI.  His blood pressure did improve after fluids.  Initially was given a liter of fluids but will add another 1500 cc for a total of 30 cc/kg.  Patient's chest x-ray shows an obvious right upper lobe infiltrate consistent with pneumonia.  Given his recent hospitalization and influenza I am concerned for staph pneumonia so we will cover with bank ceftriaxone and is a throw.  His CT head does not show anything acute.  Blood cultures have been sent and his UA is still pending.  Will admit for sepsis and pneumonia.   Rada Hay, MD 12/02/20 [phone]

## 2020-12-02 NOTE — H&P (Addendum)
[facility]   PATIENT NAME: Steve Ramirez    MR#:  [id number]  DATE OF BIRTH:  May 08, 1935  DATE OF ADMISSION:  12/01/2020  PRIMARY CARE PHYSICIAN: Sharilyn Sites, MD   Patient is coming from: Assisted living facility  REQUESTING/REFERRING PHYSICIAN: Rada Hay, MD  CHIEF COMPLAINT:   Chief Complaint  Patient presents with   Altered Mental Status    HISTORY OF PRESENT ILLNESS:  Steve Ramirez is a 85 y.o. Caucasian male with medical history significant for coronary artery disease, essential hypertension, TIA/CVA and type 2 diabetes mellitus, who presented to the ER with acute onset of worsening dyspnea with associated dry cough and wheezing over the last couple of days.  He has been treated for confusion and metabolic encephalopathy and influenza A for which he was hospitalized here on 10/29-11/8.  He denies any fever or chills.  No nausea or vomiting or abdominal pain.  No chest pain or palpitations.  No dysuria, oliguria or hematuria or flank pain.  ED Course: When he came to the ER vital signs were within normal except for blood pressure was 88/48 with heart rate of 58.  Lactic acid was 4.7 initially and later 8.3.  CMP was remarkable for hyperkalemia 5.8 and CO2 was 20 with a BUN of 37 and creatinine 1.39.  PCT was 1.25 EKG as reviewed by me : EKG showed normal sinus rhythm rate 66 with chronic PR interval, right bundle branch block and left intrafascicular block with T wave inversion inferiorly and laterally. Imaging: Chest x-ray showed right midlung zone focal pneumonic infiltrate.  The patient was given IV Rocephin and Zithromax as well as vancomycin and 2.5 L bolus of IV lactated ringer.  He will be admitted to a medical telemetry bed for further evaluation and management. PAST MEDICAL HISTORY:   Past Medical History:  Diagnosis Date   Coronary atherosclerosis of native coronary artery    Essential hypertension, benign    GIB (gastrointestinal  bleeding)    Colonic diverticular bleed   History of melanoma    History of TIAs    Stroke (HCC)    Type 2 diabetes mellitus ([place])     PAST SURGICAL HISTORY:   Past Surgical History:  Procedure Laterality Date   COLONOSCOPY  08/13/2010   Procedure: COLONOSCOPY;  Surgeon: Rogene Houston, MD;  Location: [facility];  Service: Endoscopy;  Laterality: N/A;   JOINT REPLACEMENT     KNEE SURGERY     arthroscopic   LEFT HEART CATHETERIZATION WITH CORONARY ANGIOGRAM N/A 11/24/2010   Procedure: LEFT HEART CATHETERIZATION WITH CORONARY ANGIOGRAM;  Surgeon: Josue Hector, MD;  Location: [facility] CATH LAB;  Service: Cardiovascular;  Laterality: N/A;   neck fusion     years ago   TOTAL HIP ARTHROPLASTY Right 08/02/2018   Procedure: TOTAL HIP ARTHROPLASTY ANTERIOR APPROACH;  Surgeon: Renette Butters, MD;  Location: [facility] ORS;  Service: Orthopedics;  Laterality: Right;   TOTAL HIP ARTHROPLASTY Left 10/18/2018   Procedure: TOTAL HIP ARTHROPLASTY ANTERIOR APPROACH;  Surgeon: Renette Butters, MD;  Location: [facility] ORS;  Service: Orthopedics;  Laterality: Left;    SOCIAL HISTORY:   Social History   Tobacco Use   Smoking status: Former    Types: Cigarettes    Quit date: 01/13/1980    Years since quitting: 40.9   Smokeless tobacco: Never  Substance Use Topics   Alcohol use: Yes    Comment: occasionally    FAMILY HISTORY:  Family History  Problem Relation Age of Onset   Coronary artery disease Father    Asthma Other    Emphysema Mother    Coronary artery disease Paternal Uncle    Brain cancer Brother     DRUG ALLERGIES:  No Known Allergies  REVIEW OF SYSTEMS:   ROS As per history of present illness. All pertinent systems were reviewed above. Constitutional, HEENT, cardiovascular, respiratory, GI, GU, musculoskeletal, neuro, psychiatric, endocrine, integumentary and hematologic systems were reviewed and are otherwise negative/unremarkable except for positive findings mentioned above in the  HPI.   MEDICATIONS AT HOME:   Prior to Admission medications   Medication Sig Start Date End Date Taking? Authorizing Provider  acetaminophen (TYLENOL) 325 MG tablet Take 2 tablets (650 mg total) by mouth every 4 (four) hours as needed for mild pain (or temp > 37.5 C (99.5 F)). 11/19/20   Lorella Nimrod, MD  amLODipine (NORVASC) 10 MG tablet Take 1 tablet (10 mg total) by mouth daily. 11/19/20   Lorella Nimrod, MD  aspirin EC 81 MG tablet Take 1 tablet (81 mg total) by mouth 2 (two) times daily. For DVT prophylaxis for 30 days after surgery. 10/18/18   Prudencio Burly III, PA-C  cetirizine (ZYRTEC) 10 MG tablet Take 10 mg by mouth daily.    [provider]  cloNIDine (CATAPRES - DOSED IN MG/24 HR) 0.3 mg/24hr patch Place 1 patch (0.3 mg total) onto the skin once a week. Patient taking differently: Place 0.3 mg onto the skin every Saturday. 05/26/17   Caren Macadam, MD  clopidogrel (PLAVIX) 75 MG tablet Take 1 tablet (75 mg total) by mouth daily. 06/10/17   Caren Macadam, MD  ferrous sulfate 325 (65 FE) MG tablet Take 325 mg by mouth daily with breakfast.    [provider]  ipratropium-albuterol (DUONEB) 0.5-2.5 (3) MG/3ML SOLN Take 3 mLs by nebulization every 4 (four) hours as needed. 11/19/20   Lorella Nimrod, MD  isosorbide mononitrate (IMDUR) 30 MG 24 hr tablet Take 30 mg by mouth every evening.  11/30/16   [provider]  levothyroxine (SYNTHROID, LEVOTHROID) 50 MCG tablet Take 1 tablet (50 mcg total) by mouth daily. 06/10/17   Caren Macadam, MD  lisinopril (PRINIVIL,ZESTRIL) 40 MG tablet Take 1 tablet (40 mg total) by mouth daily. Patient taking differently: Take 40 mg by mouth at bedtime. 04/01/17   Caren Macadam, MD  metFORMIN (GLUCOPHAGE) 1000 MG tablet Take 1,000 mg by mouth 2 (two) times daily. 10/21/20   [provider]  metoprolol succinate (TOPROL-XL) 25 MG 24 hr tablet Take 25 mg by mouth every evening.     [provider]  Multiple  Vitamin (MULTIVITAMIN) tablet Take 1 tablet by mouth daily.      [provider]  nitroGLYCERIN (NITROSTAT) 0.4 MG SL tablet Place 0.4 mg under the tongue every 5 (five) minutes as needed for chest pain.    [provider]  Omega-3 Fatty Acids (FISH OIL) 1000 MG CAPS Take 1,000 mg by mouth at bedtime.    [provider]  pravastatin (PRAVACHOL) 20 MG tablet Take 20 mg by mouth at bedtime.    [provider]  rOPINIRole (REQUIP) 0.5 MG tablet Take 2 tablets (1 mg total) by mouth at bedtime. 06/10/17   Caren Macadam, MD  spironolactone (ALDACTONE) 25 MG tablet Take 1 tablet (25 mg total) by mouth daily. Patient taking differently: Take 25 mg by mouth at bedtime. 06/10/17   Caren Macadam, MD  vitamin B-12 (  CYANOCOBALAMIN) 1000 MCG tablet Take 1 tablet (1,000 mcg total) by mouth daily. 11/05/20   Sharen Hones, MD  rosuvastatin (CRESTOR) 40 MG tablet Take 1 tablet (40 mg total) by mouth daily. 11/26/10 04/08/11  Theora Gianotti, NP      VITAL SIGNS:  Blood pressure 126/63, pulse 69, temperature (!) 97.4 F (36.3 C), temperature source Rectal, resp. rate 20, height 5\' 8"  (1.727 m), weight 81.6 kg, SpO2 100 %.  PHYSICAL EXAMINATION:  Physical Exam  GENERAL:  85 y.o.-year-old Caucasian male patient lying in the bed with no acute distress.  EYES: Pupils equal, round, reactive to light and accommodation. No scleral icterus. Extraocular muscles intact.  HEENT: Head atraumatic, normocephalic. Oropharynx and nasopharynx clear.  NECK:  Supple, no jugular venous distention. No thyroid enlargement, no tenderness.  LUNGS: Diminished midlung zone breath sounds with associated crackles.  No use of accessory muscles of respiration.  CARDIOVASCULAR: Regular rate and rhythm, S1, S2 normal. No murmurs, rubs, or gallops.  ABDOMEN: Soft, nondistended, nontender. Bowel sounds present. No organomegaly or mass.  EXTREMITIES: No pedal edema, cyanosis, or clubbing.   NEUROLOGIC: Cranial nerves II through XII are intact. Muscle strength 5/5 in all extremities. Sensation intact. Gait not checked.  PSYCHIATRIC: The patient is alert and oriented x 3.  Normal affect and good eye contact. SKIN: No obvious rash, lesion, or ulcer.   LABORATORY PANEL:   CBC Recent Labs  Lab 12/01/20 2244  WBC 12.5*  HGB 12.3*  HCT 40.3  PLT 356   ------------------------------------------------------------------------------------------------------------------  Chemistries  Recent Labs  Lab 12/01/20 2244  NA 135  K 5.8*  CL 105  CO2 20*  GLUCOSE 157*  BUN 37*  CREATININE 1.39*  CALCIUM 9.3  AST 17  ALT 14  ALKPHOS 62  BILITOT 0.5   ------------------------------------------------------------------------------------------------------------------  Cardiac Enzymes No results for input(s): TROPONINI in the last 168 hours. ------------------------------------------------------------------------------------------------------------------  RADIOLOGY:  DG Chest 2 View  Result Date: 12/02/2020 CLINICAL DATA:  Altered mental status EXAM: CHEST - 2 VIEW COMPARISON:  11/09/2020 FINDINGS: There is focal consolidation within the right mid lung zone in keeping with changes of acute infection in the appropriate clinical setting. No pneumothorax or pleural effusion. Cardiac size within normal limits. No acute bone abnormality. IMPRESSION: Right mid lung zone focal pneumonic infiltrate. Electronically Signed   By: Fidela Salisbury M.D.   On: 12/02/2020 00:21   CT Head Wo Contrast  Result Date: 12/02/2020 CLINICAL DATA:  Altered mental status EXAM: CT HEAD WITHOUT CONTRAST TECHNIQUE: Contiguous axial images were obtained from the base of the skull through the vertex without intravenous contrast. COMPARISON:  11/09/2020 FINDINGS: Brain: No evidence of acute infarction, hemorrhage, hydrocephalus, extra-axial collection or mass lesion/mass effect. Chronic atrophic and ischemic  changes are noted. Chronic lacunar infarcts are noted in region the thalami and basal ganglia bilaterally stable from the prior study. Vascular: No hyperdense vessel or unexpected calcification. Skull: Normal. Negative for fracture or focal lesion. Sinuses/Orbits: No acute finding. Other: None. IMPRESSION: Chronic atrophic and ischemic changes stable prior exam. Electronically Signed   By: Inez Catalina M.D.   On: 12/02/2020 00:04      IMPRESSION AND PLAN:  Principal Problem:   Sepsis due to pneumonia [facility]) Active Problems:   CAP (community acquired pneumonia)  1.  Right-sided community-acquired pneumonia. - The patient will be admitted to a medical telemetry bed. - We will continue antibiotic therapy with IV Rocephin and Zithromax. - Mucolytic therapy will be provided. - We will place him  on DuoNebs 4 times daily every 4 hours as needed. - We will follow blood and sputum cultures. - We will follow pneumonia antigens.  2.  Acute kidney injury likely prerenal, due to hypovolemia. - The patient will be hydrated with IV normal saline and will follow BMP.  3.  Hyperkalemia. - This is likely related to his AKI. - We will manage with D50 insulin and d follow.  4.  Lactic acidosis.  The patient does not meet criteria for sepsis and this likely secondary to volume depletion and dehydration. - He will be hydrated with IV normal saline and will follow his BMP and lactic as well as PCT.  5.  Essential hypertension. - We will continue amlodipine and Catapres patch as well as Toprol-XL - We will hold off Zestril given acute kidney injury.  6.  Type II diabetes mellitus.  - The patient will be placed on supplement coverage with NovoLog. - We will hold off metformin given acute kidney injury.  7.  Coronary artery disease. - We will continue as needed sublingual nitroglycerin, Imdur, statin therapy and beta-blocker therapy as well as aspirin.  8.  Hypothyroidism. - We will continue  Synthroid.    DVT prophylaxis: Lovenox.  Code Status: full code. . Family Communication:  The plan of care was discussed in details with the patient (and family). I answered all questions. The patient agreed to proceed with the above mentioned plan. Further management will depend upon hospital course. Disposition Plan: Back to previous home environment Consults called: none.  All the records are reviewed and case discussed with ED provider.  Status is: Inpatient   Remains inpatient appropriate because:Ongoing diagnostic testing needed not appropriate for outpatient work up, Unsafe d/c plan, IV treatments appropriate due to intensity of illness or inability to take PO, and Inpatient level of care appropriate due to severity of illness   Dispo: The patient is from: ALF              Anticipated d/c is to: ALF              Patient currently is not medically stable to d/c.              Difficult to place patient: No  TOTAL TIME TAKING CARE OF THIS PATIENT: 55 minutes.     Christel Mormon M.D on 12/02/2020 at 1:39 AM  [facility]   From 7 PM-7 AM, contact night-coverage [email or web address]  CC: Primary care physician; Sharilyn Sites, MD

## 2020-12-02 NOTE — Progress Notes (Addendum)
  PROGRESS NOTE    ESSEX PERRY  BXI:[id number] DOB: 02-09-1935 DOA: 12/01/2020  PCP: Sharilyn Sites, MD    LOS - 0    Patient admitted after midnight with community-acquired pneumonia after having influenza A recently.  Presented with confusion, cough and wheezing.  He was admitted here with Flu A from 10/29-11/8.  Initially hypotensive in the ED, with lactic acidosis but otherwise did not meet sepsis criteria.  Lactic acidosis resolved with IV hydration.  Started on IV antibiotics for CAP coverage and admitted.  Interval subjective: Patient seen in the ED this morning, holding for a bed.  Says he is feeling much better.  Says he thinks his confusion was another TIA, that he said 7 of them in the past.  Denies any persistent neurologic symptoms today including difficulty speaking or swallowing, unilateral numbness tingling or weakness, visit changes or dizziness.  He does not want to go back to [facility] for rehab, wants to go back to [place].  Expects his [facility] to be discharged from [facility] back to [facility] tomorrow.  He does report some cough but no shortness of breath, chest pain, nausea vomiting or other complaints.    Exam: Awake and alert, no acute distress Lungs overall clear with right-sided faint crackles. Heart regular rate and rhythm with no peripheral edema. Abdomen soft nontender  Extremities moves all, normal tone no edema Neurologic normal speech, cranial nerves grossly intact, grossly nonfocal exam HEENT dry mucous membranes, hearing grossly normal, clear conjunctiva   Principal Problem:   Sepsis due to pneumonia [facility]) Active Problems:   CAP (community acquired pneumonia)    I have reviewed the full H&P by Dr. Sidney Ace in detail, and I agree with the assessment and plan as outlined therein.  In addition: -- Lactate acidosis resolved.  Stop IV fluids --PT and OT evaluations Patient had been discharged to rehab at [facility] does not want to go back there.   Hoping he  can return to [place].    No Charge    Ezekiel Slocumb, DO [facility]   If 7PM-7AM, please contact night-coverage [email or web address] 12/02/2020, 7:43 AM

## 2020-12-02 NOTE — ED Notes (Signed)
ED TO INPATIENT HANDOFF REPORT  ED Nurse Name and Phone #: Darnelle Maffucci [phone]  S Name/Age/Gender Steve Ramirez 85 y.o. male Room/Bed: ED10A/ED10A  Code Status   Code Status: Full Code  Home/SNF/Other Nursing Home Patient oriented to: self, place and time  Is this baseline? Yes   Triage Complete: Triage complete  Chief Complaint Sepsis due to pneumonia ([place]) [J18.9, A41.9] CAP (community acquired pneumonia) [J18.9]  Triage Note Pt BIB EMS for altered mental status, initially unresponsive for EMS, awakened when moved from bed to stretcher and is responsive to verbal stimuli. Patient's BG is 210. Facility unable to provide time last seen normal. MD at bedside to evaluate.    Allergies No Known Allergies  Level of Care/Admitting Diagnosis ED Disposition     ED Disposition  Admit   Condition  --   Comment  Hospital Area: [facility] [[id number]]  Level of Care: Telemetry Medical [[id number]]  Covid Evaluation: Asymptomatic Screening Protocol (No Symptoms)  Diagnosis: CAP (community acquired pneumonia) [176160]  Admitting Physician: Christel Mormon [[id number]]  Attending Physician: Christel Mormon [[id number]]  Estimated length of stay: past midnight tomorrow  Certification:: I certify this patient will need inpatient services for at least 2 midnights          B Medical/Surgery History Past Medical History:  Diagnosis Date   Coronary atherosclerosis of native coronary artery    Essential hypertension, benign    GIB (gastrointestinal bleeding)    Colonic diverticular bleed   History of melanoma    History of TIAs    Stroke ([place])    Type 2 diabetes mellitus ([place])    Past Surgical History:  Procedure Laterality Date   COLONOSCOPY  08/13/2010   Procedure: COLONOSCOPY;  Surgeon: Rogene Houston, MD;  Location[facility];  Service: Endoscopy;  Laterality: N/A;   JOINT REPLACEMENT     KNEE SURGERY     arthroscopic   LEFT HEART CATHETERIZATION WITH CORONARY  ANGIOGRAM N/A 11/24/2010   Procedure: LEFT HEART CATHETERIZATION WITH CORONARY ANGIOGRAM;  Surgeon: Josue Hector, MD;  [facility]: [facility] CATH LAB;  Service: Cardiovascular;  Laterality: N/A;   neck fusion     years ago   TOTAL HIP ARTHROPLASTY Right 08/02/2018   Procedure: TOTAL HIP ARTHROPLASTY ANTERIOR APPROACH;  Surgeon: Renette Butters, MD;  Location: [facility] ORS;  Service: Orthopedics;  Laterality: Right;   TOTAL HIP ARTHROPLASTY Left 10/18/2018   Procedure: TOTAL HIP ARTHROPLASTY ANTERIOR APPROACH;  Surgeon: Renette Butters, MD;  Location: [facility] ORS;  Service: Orthopedics;  Laterality: Left;     A IV Location/Drains/Wounds Patient Lines/Drains/Airways Status     Active Line/Drains/Airways     Name Placement date Placement time Site Days   Peripheral IV 12/01/20 20 G Anterior;Left Forearm 12/01/20  2245  Forearm  1   Peripheral IV 12/01/20 20 G Posterior;Right Hand 12/01/20  2300  Hand  1   Pressure Injury 11/11/20 Buttocks Mid Stage 1 -  Intact skin with non-blanchable redness of a localized area usually over a bony prominence. 11/11/20  1828  -- 21            Intake/Output Last 24 hours  Intake/Output Summary (Last 24 hours) at 12/02/2020 1349 Last data filed at 12/02/2020 1326 Gross per 24 hour  Intake 1451.67 ml  Output 950 ml  Net 501.67 ml    Labs/Imaging Results for orders placed or performed during the hospital encounter of 12/01/20 (from the past 48 hour(s))  CBC with  Differential     Status: Abnormal   Collection Time: 12/01/20 10:44 PM  Result Value Ref Range   WBC 12.5 (H) 4.0 - 10.5 K/uL   RBC 5.38 4.22 - 5.81 MIL/uL   Hemoglobin 12.3 (L) 13.0 - 17.0 g/dL   HCT 40.3 39.0 - 52.0 %   MCV 74.9 (L) 80.0 - 100.0 fL   MCH 22.9 (L) 26.0 - 34.0 pg   MCHC 30.5 30.0 - 36.0 g/dL   RDW 19.6 (H) 11.5 - 15.5 %   Platelets 356 150 - 400 K/uL   nRBC 0.0 0.0 - 0.2 %   Neutrophils Relative % 83 %   Neutro Abs 10.4 (H) 1.7 - 7.7 K/uL   Lymphocytes Relative 8 %   Lymphs  Abs 1.0 0.7 - 4.0 K/uL   Monocytes Relative 8 %   Monocytes Absolute 1.0 0.1 - 1.0 K/uL   Eosinophils Relative 1 %   Eosinophils Absolute 0.1 0.0 - 0.5 K/uL   Basophils Relative 0 %   Basophils Absolute 0.1 0.0 - 0.1 K/uL   Immature Granulocytes 0 %   Abs Immature Granulocytes 0.04 0.00 - 0.07 K/uL    Comment: Performed at [facility], [address] [place], [place] [address]  Comprehensive metabolic panel     Status: Abnormal   Collection Time: 12/01/20 10:44 PM  Result Value Ref Range   Sodium 135 135 - 145 mmol/L   Potassium 5.8 (H) 3.5 - 5.1 mmol/L   Chloride 105 98 - 111 mmol/L   CO2 20 (L) 22 - 32 mmol/L   Glucose, Bld 157 (H) 70 - 99 mg/dL    Comment: Glucose reference range applies only to samples taken after fasting for at least 8 hours.   BUN 37 (H) 8 - 23 mg/dL   Creatinine, Ser 1.39 (H) 0.61 - 1.24 mg/dL   Calcium 9.3 8.9 - 10.3 mg/dL   Total Protein 7.3 6.5 - 8.1 g/dL   Albumin 4.0 3.5 - 5.0 g/dL   AST 17 15 - 41 U/L   ALT 14 0 - 44 U/L   Alkaline Phosphatase 62 38 - 126 U/L   Total Bilirubin 0.5 0.3 - 1.2 mg/dL   GFR, Estimated 50 (L) >60 mL/min    Comment: (NOTE) Calculated using the CKD-EPI Creatinine Equation (2021)    Anion gap 10 5 - 15    Comment: Performed at [facility], [place], [place] [address]  Troponin I (High Sensitivity)     Status: None   Collection Time: 12/01/20 10:44 PM  Result Value Ref Range   Troponin I (High Sensitivity) 12 <18 ng/L    Comment: (NOTE) Elevated high sensitivity troponin I (hsTnI) values and significant  changes across serial measurements may suggest ACS but many other  chronic and acute conditions are known to elevate hsTnI results.  Refer to the "Links" section for chest pain algorithms and additional  guidance. Performed at [facility], [address] [place], [place] [address]   Lactic acid, plasma     Status: Abnormal   Collection Time: 12/01/20 10:44 PM   Result Value Ref Range   Lactic Acid, Venous 4.7 (HH) 0.5 - 1.9 mmol/L    Comment: CRITICAL RESULT CALLED TO, READ BACK BY AND VERIFIED WITH SARA SCHIFFELBEIN AT 0006 12/02/2020 DLB Performed at [facility], [address], [place], [place] [address]   Procalcitonin - Baseline     Status: None   Collection Time: 12/01/20 10:44  PM  Result Value Ref Range   Procalcitonin 1.25 ng/mL    Comment:        Interpretation: PCT > 0.5 ng/mL and <= 2 ng/mL: Systemic infection (sepsis) is possible, but other conditions are known to elevate PCT as well. (NOTE)       Sepsis PCT Algorithm           Lower Respiratory Tract                                      Infection PCT Algorithm    ----------------------------     ----------------------------         PCT < 0.25 ng/mL                PCT < 0.10 ng/mL          Strongly encourage             Strongly discourage   discontinuation of antibiotics    initiation of antibiotics    ----------------------------     -----------------------------       PCT 0.25 - 0.50 ng/mL            PCT 0.10 - 0.25 ng/mL               OR       >80% decrease in PCT            Discourage initiation of                                            antibiotics      Encourage discontinuation           of antibiotics    ----------------------------     -----------------------------         PCT >= 0.50 ng/mL              PCT 0.26 - 0.50 ng/mL                AND       <80% decrease in PCT             Encourage initiation of                                             antibiotics       Encourage continuation           of antibiotics    ----------------------------     -----------------------------        PCT >= 0.50 ng/mL                  PCT > 0.50 ng/mL               AND         increase in PCT                  Strongly encourage                                      initiation of antibiotics    Strongly encourage escalation  of antibiotics                                      -----------------------------                                           PCT <= 0.25 ng/mL                                                 OR                                        > 80% decrease in PCT                                      Discontinue / Do not initiate                                             antibiotics  Performed at [facility], [address] [place], [place] [address]   Culture, blood (routine x 2)     Status: None (Preliminary result)   Collection Time: 12/01/20 10:45 PM   Specimen: BLOOD  Result Value Ref Range   Specimen Description BLOOD LARM    Special Requests      BOTTLES DRAWN AEROBIC AND ANAEROBIC Blood Culture results may not be optimal due to an inadequate volume of blood received in culture bottles   Culture      NO GROWTH < 12 HOURS Performed at [facility], [address], [place], [place] [address]    Report Status PENDING   Culture, blood (routine x 2)     Status: None (Preliminary result)   Collection Time: 12/01/20 10:45 PM   Specimen: BLOOD  Result Value Ref Range   Specimen Description BLOOD RHAND    Special Requests      BOTTLES DRAWN AEROBIC AND ANAEROBIC Blood Culture adequate volume   Culture      NO GROWTH < 12 HOURS Performed at [facility], [address] [place], [place] [address]    Report Status PENDING   Urinalysis, Routine w reflex microscopic Urine, In & Out Cath     Status: Abnormal   Collection Time: 12/01/20 11:19 PM  Result Value Ref Range   Color, Urine YELLOW (A) YELLOW   APPearance HAZY (A) CLEAR   Specific Gravity, Urine 1.017 1.005 - 1.030   pH 5.0 5.0 - 8.0   Glucose, UA NEGATIVE NEGATIVE mg/dL   Hgb urine dipstick NEGATIVE NEGATIVE   Bilirubin Urine NEGATIVE NEGATIVE   Ketones, ur NEGATIVE NEGATIVE mg/dL   Protein, ur NEGATIVE NEGATIVE mg/dL   Nitrite NEGATIVE NEGATIVE   Leukocytes,Ua NEGATIVE NEGATIVE    Comment: Performed at [facility], [address] [place], [place] [address]  Strep pneumoniae urinary antigen     Status: None  Collection Time: 12/01/20 11:19 PM  Result Value Ref Range   Strep Pneumo Urinary Antigen NEGATIVE NEGATIVE    Comment:        Infection due to S. pneumoniae cannot be absolutely ruled out since the antigen present may be below the detection limit of the test. Performed at [facility], [address] [place], [place] [address]   Lactic acid, plasma     Status: Abnormal   Collection Time: 12/02/20 12:35 AM  Result Value Ref Range   Lactic Acid, Venous 8.3 (HH) 0.5 - 1.9 mmol/L    Comment: CRITICAL VALUE NOTED. VALUE IS CONSISTENT WITH PREVIOUSLY REPORTED/CALLED VALUE DLB Performed at [facility], [address] [place], [place] [address]   Troponin I (High Sensitivity)     Status: None   Collection Time: 12/02/20 12:35 AM  Result Value Ref Range   Troponin I (High Sensitivity) 10 <18 ng/L    Comment: (NOTE) Elevated high sensitivity troponin I (hsTnI) values and significant  changes across serial measurements may suggest ACS but many other  chronic and acute conditions are known to elevate hsTnI results.  Refer to the "Links" section for chest pain algorithms and additional  guidance. Performed at [facility], [address] [place], [place] [address]   Resp Panel by RT-PCR (Flu A&B, Covid) Nasopharyngeal Swab     Status: None   Collection Time: 12/02/20  1:23 AM   Specimen: Nasopharyngeal Swab; Nasopharyngeal(NP) swabs in vial transport medium  Result Value Ref Range   SARS Coronavirus 2 by RT PCR NEGATIVE NEGATIVE    Comment: (NOTE) SARS-CoV-2 target nucleic acids are NOT DETECTED.  The SARS-CoV-2 RNA is generally detectable in upper respiratory specimens during the acute phase of infection. The lowest concentration of SARS-CoV-2 viral copies this assay can detect is 138 copies/mL. A negative result does not preclude SARS-Cov-2 infection  and should not be used as the sole basis for treatment or other patient management decisions. A negative result may occur with  improper specimen collection/handling, submission of specimen other than nasopharyngeal swab, presence of viral mutation(s) within the areas targeted by this assay, and inadequate number of viral copies(<138 copies/mL). A negative result must be combined with clinical observations, patient history, and epidemiological information. The expected result is Negative.  Fact Sheet for Patients:  EntrepreneurPulse.com.au  Fact Sheet for Healthcare Providers:  IncredibleEmployment.be  This test is no t yet approved or cleared by the [place] FDA and  has been authorized for detection and/or diagnosis of SARS-CoV-2 by FDA under an Emergency Use Authorization (EUA). This EUA will remain  in effect (meaning this test can be used) for the duration of the COVID-19 declaration under Section 564(b)(1) of the Act, 21 U.S.C.section 360bbb-3(b)(1), unless the authorization is terminated  or revoked sooner.       Influenza A by PCR NEGATIVE NEGATIVE   Influenza B by PCR NEGATIVE NEGATIVE    Comment: (NOTE) The Xpert Xpress SARS-CoV-2/FLU/RSV plus assay is intended as an aid in the diagnosis of influenza from Nasopharyngeal swab specimens and should not be used as a sole basis for treatment. Nasal washings and aspirates are unacceptable for Xpert Xpress SARS-CoV-2/FLU/RSV testing.  Fact Sheet for Patients: EntrepreneurPulse.com.au  Fact Sheet for Healthcare Providers: IncredibleEmployment.be  This test is not yet approved or cleared by the [place] FDA and has been authorized for detection and/or diagnosis of SARS-CoV-2 by FDA under an Emergency Use Authorization (EUA). This EUA will remain in effect (meaning  this test can be used) for the duration of the COVID-19 declaration under  Section 564(b)(1) of the Act, 21 U.S.C. section 360bbb-3(b)(1), unless the authorization is terminated or revoked.  Performed at [facility], [address] [place], [place] [address]   Basic metabolic panel     Status: Abnormal   Collection Time: 12/02/20  5:05 AM  Result Value Ref Range   Sodium 134 (L) 135 - 145 mmol/L   Potassium 4.5 3.5 - 5.1 mmol/L   Chloride 106 98 - 111 mmol/L   CO2 23 22 - 32 mmol/L   Glucose, Bld 122 (H) 70 - 99 mg/dL    Comment: Glucose reference range applies only to samples taken after fasting for at least 8 hours.   BUN 30 (H) 8 - 23 mg/dL   Creatinine, Ser 0.99 0.61 - 1.24 mg/dL   Calcium 8.5 (L) 8.9 - 10.3 mg/dL   GFR, Estimated >60 >60 mL/min    Comment: (NOTE) Calculated using the CKD-EPI Creatinine Equation (2021)    Anion gap 5 5 - 15    Comment: Performed at [facility], [address] [place], [place] [address]  CBC     Status: Abnormal   Collection Time: 12/02/20  5:05 AM  Result Value Ref Range   WBC 9.7 4.0 - 10.5 K/uL   RBC 4.66 4.22 - 5.81 MIL/uL   Hemoglobin 10.8 (L) 13.0 - 17.0 g/dL   HCT 34.5 (L) 39.0 - 52.0 %   MCV 74.0 (L) 80.0 - 100.0 fL   MCH 23.2 (L) 26.0 - 34.0 pg   MCHC 31.3 30.0 - 36.0 g/dL   RDW 19.2 (H) 11.5 - 15.5 %   Platelets 307 150 - 400 K/uL   nRBC 0.0 0.0 - 0.2 %    Comment: Performed at [facility], [address] [place], [place] [address]  Lactic acid, plasma     Status: Abnormal   Collection Time: 12/02/20  5:05 AM  Result Value Ref Range   Lactic Acid, Venous 2.5 (HH) 0.5 - 1.9 mmol/L    Comment: CRITICAL VALUE NOTED. VALUE IS CONSISTENT WITH PREVIOUSLY REPORTED/CALLED VALUE DLB Performed at [facility], [address] [place], [place] [address]   Lactic acid, plasma     Status: None   Collection Time: 12/02/20  6:25 AM  Result Value Ref Range   Lactic Acid, Venous 1.5 0.5 - 1.9 mmol/L    Comment: Performed at [facility], [address] [place], [place] [address]   DG Chest 2 View  Result Date: 12/02/2020 CLINICAL DATA:  Altered mental status EXAM: CHEST - 2 VIEW COMPARISON:  11/09/2020 FINDINGS: There is focal consolidation within the right mid lung zone in keeping with changes of acute infection in the appropriate clinical setting. No pneumothorax or pleural effusion. Cardiac size within normal limits. No acute bone abnormality. IMPRESSION: Right mid lung zone focal pneumonic infiltrate. Electronically Signed   By: Fidela Salisbury M.D.   On: 12/02/2020 00:21   CT Head Wo Contrast  Result Date: 12/02/2020 CLINICAL DATA:  Altered mental status EXAM: CT HEAD WITHOUT CONTRAST TECHNIQUE: Contiguous axial images were obtained from the base of the skull through the vertex without intravenous contrast. COMPARISON:  11/09/2020 FINDINGS: Brain: No evidence of acute infarction, hemorrhage, hydrocephalus, extra-axial collection or mass lesion/mass effect. Chronic atrophic and ischemic changes are noted. Chronic lacunar infarcts are noted in region the thalami and basal ganglia bilaterally stable from the prior study. Vascular:  No hyperdense vessel or unexpected calcification. Skull: Normal. Negative for fracture or focal lesion. Sinuses/Orbits: No acute finding. Other: None. IMPRESSION: Chronic atrophic and ischemic changes stable prior exam. Electronically Signed   By: Inez Catalina M.D.   On: 12/02/2020 00:04    Pending Labs Unresulted Labs (From admission, onward)     Start     Ordered   12/02/20 0137  Legionella Pneumophila Serogp 1 Ur Ag  (COPD / Pneumonia / Cellulitis / Lower Extremity Wound)  Once,   STAT        12/02/20 0138   12/02/20 0137  Expectorated Sputum Assessment w Gram Stain, Rflx to Resp Cult  (COPD / Pneumonia / Cellulitis / Lower Extremity Wound)  Once,   STAT        12/02/20 0138            Vitals/Pain Today's Vitals   12/02/20 0900 12/02/20 0920 12/02/20 1040 12/02/20 1324  BP: 129/65 133/73 (!) 121/53 136/61   Pulse: 64 70 80 76  Resp: 12 16 18 18   Temp:  (!) 97.5 F (36.4 C) (!) 97.5 F (36.4 C) 97.7 F (36.5 C)  TempSrc:  Oral Oral Oral  SpO2: 98% 96% 95% 96%  Weight:      Height:      PainSc:  0-No pain 0-No pain 0-No pain    Isolation Precautions No active isolations  Medications Medications  amLODipine (NORVASC) tablet 10 mg (10 mg Oral Given 12/02/20 1043)  cloNIDine (CATAPRES - Dosed in mg/24 hr) patch 0.3 mg (has no administration in time range)  isosorbide mononitrate (IMDUR) 24 hr tablet 30 mg (has no administration in time range)  metoprolol succinate (TOPROL-XL) 24 hr tablet 25 mg (has no administration in time range)  nitroGLYCERIN (NITROSTAT) SL tablet 0.4 mg (has no administration in time range)  pravastatin (PRAVACHOL) tablet 20 mg (0 mg Oral Hold 12/02/20 0235)  levothyroxine (SYNTHROID) tablet 50 mcg (50 mcg Oral Given 12/02/20 0554)  clopidogrel (PLAVIX) tablet 75 mg (75 mg Oral Given 12/02/20 1044)  ferrous sulfate tablet 325 mg (325 mg Oral Given 12/02/20 1044)  vitamin B-12 (CYANOCOBALAMIN) tablet 1,000 mcg (1,000 mcg Oral Given 12/02/20 1046)  rOPINIRole (REQUIP) tablet 1 mg (0 mg Oral Hold 12/02/20 0235)  multivitamin with minerals tablet 1 tablet (1 tablet Oral Given 12/02/20 1046)  loratadine (CLARITIN) tablet 10 mg (10 mg Oral Given 12/02/20 1045)  omega-3 acid ethyl esters (LOVAZA) capsule 1 g (has no administration in time range)  cefTRIAXone (ROCEPHIN) 2 g in sodium chloride 0.9 % 100 mL IVPB (has no administration in time range)  azithromycin (ZITHROMAX) 500 mg in sodium chloride 0.9 % 250 mL IVPB (has no administration in time range)  enoxaparin (LOVENOX) injection 40 mg (40 mg Subcutaneous Given 12/02/20 0841)  0.9 %  sodium chloride infusion (0 mLs Intravenous Stopped 12/02/20 1326)  acetaminophen (TYLENOL) tablet 650 mg (has no administration in time range)    Or  acetaminophen (TYLENOL) suppository 650 mg (has no administration in time range)   traZODone (DESYREL) tablet 25 mg (has no administration in time range)  magnesium hydroxide (MILK OF MAGNESIA) suspension 30 mL (has no administration in time range)  guaiFENesin (MUCINEX) 12 hr tablet 600 mg (600 mg Oral Given 12/02/20 1045)  chlorpheniramine-HYDROcodone (TUSSIONEX) 10-8 MG/5ML suspension 5 mL (has no administration in time range)  ipratropium-albuterol (DUONEB) 0.5-2.5 (3) MG/3ML nebulizer solution 3 mL (3 mLs Nebulization Given 12/02/20 1327)  sodium chloride 0.9 % bolus 500 mL (0 mLs  Intravenous Hold 12/02/20 0515)  aspirin EC tablet 81 mg (81 mg Oral Given 12/02/20 1326)  lactated ringers bolus 1,000 mL (0 mLs Intravenous Stopped 12/02/20 0000)  lactated ringers bolus 1,500 mL (0 mLs Intravenous Stopped 12/02/20 0221)  cefTRIAXone (ROCEPHIN) 2 g in sodium chloride 0.9 % 100 mL IVPB (0 g Intravenous Stopped 12/02/20 0117)  azithromycin (ZITHROMAX) 500 mg in sodium chloride 0.9 % 250 mL IVPB (0 mg Intravenous Stopped 12/02/20 0221)  vancomycin (VANCOREADY) IVPB 2000 mg/400 mL (0 mg Intravenous Stopped 12/02/20 0421)    Mobility walks with device Low fall risk      R Recommendations: See Admitting Provider Note  Report given to:   Additional Notes: pt from [facility]

## 2020-12-02 NOTE — ED Notes (Signed)
Pt in bed, pt denies pain, pt placed on 2L O2 via [place] secondary to O2 sat of 91%

## 2020-12-03 ENCOUNTER — Encounter: Payer: Self-pay | Admitting: Family Medicine

## 2020-12-03 LAB — GLUCOSE, CAPILLARY: Glucose-Capillary: 136 mg/dL — ABNORMAL HIGH (ref 70–99)

## 2020-12-03 LAB — BASIC METABOLIC PANEL
Anion gap: 8 (ref 5–15)
BUN: 13 mg/dL (ref 8–23)
CO2: 20 mmol/L — ABNORMAL LOW (ref 22–32)
Calcium: 8.6 mg/dL — ABNORMAL LOW (ref 8.9–10.3)
Chloride: 110 mmol/L (ref 98–111)
Creatinine, Ser: 0.6 mg/dL — ABNORMAL LOW (ref 0.61–1.24)
GFR, Estimated: >60 mL/min (ref >60)
Glucose, Bld: 111 mg/dL — ABNORMAL HIGH (ref 70–99)
Potassium: 4.3 mmol/L (ref 3.5–5.1)
Sodium: 138 mmol/L (ref 135–145)

## 2020-12-03 LAB — HEMOGLOBIN AND HEMATOCRIT, BLOOD
HCT: 33.3 % — ABNORMAL LOW (ref 39.0–52.0)
Hemoglobin: 10.6 g/dL — ABNORMAL LOW (ref 13.0–17.0)

## 2020-12-03 LAB — LEGIONELLA PNEUMOPHILA SEROGP 1 UR AG: L. pneumophila Serogp 1 Ur Ag: NEGATIVE

## 2020-12-03 LAB — MAGNESIUM: Magnesium: 1.6 mg/dL — ABNORMAL LOW (ref 1.7–2.4)

## 2020-12-03 MED ORDER — IPRATROPIUM-ALBUTEROL 0.5-2.5 (3) MG/3ML IN SOLN: 3.0000 mL | Freq: Two times a day (BID) | RESPIRATORY_TRACT | Status: DC

## 2020-12-03 MED ORDER — CEFDINIR 300 MG PO CAPS
300.0000 mg | ORAL_CAPSULE | Freq: Two times a day (BID) | ORAL | 0 refills | Status: AC
Start: 2020-12-03 — End: 2020-12-07

## 2020-12-03 MED ORDER — GUAIFENESIN ER 600 MG PO TB12: 600.0000 mg | ORAL_TABLET | Freq: Two times a day (BID) | ORAL | 0 refills | Status: AC

## 2020-12-03 MED ORDER — MAGNESIUM SULFATE 2 GM/50ML IV SOLN
2.0000 g | Freq: Once | INTRAVENOUS | Status: AC
  Administered 2020-12-03: 2 g via INTRAVENOUS
  Filled 2020-12-03: qty 50

## 2020-12-03 MED ORDER — AZITHROMYCIN 500 MG PO TABS: 500.0000 mg | ORAL_TABLET | Freq: Every day | ORAL | 0 refills | Status: AC

## 2020-12-03 NOTE — TOC Progression Note (Signed)
Transition of Care [facility]) - Progression Note    Patient Details  Name: Steve Ramirez MRN: [id number] Date of Birth: Mar 21, 1935  Transition of Care [facility]) CM/SW [place], RN Phone Number: 12/03/2020, 1:41 PM  Clinical Narrative:    Met with patient in the room, The patient will return to [place], I spoke with Lattie Haw at [facility] and she is aware, The Son will provide transportation and bring a bag of clothing for the patient, He is concerned about his belongings that he had when he came in, The bedside nurse is going to check to see if they were locked up or what may have happened top them, Lattie Haw at [facility] is setting up [facility] services for him He did very well with PT. He is anxious to Discharge and his son is on the way        Expected Discharge Plan and Services           Expected Discharge Date: 12/03/20                                     Social Determinants of Health (SDOH) Interventions    Readmission Risk Interventions No flowsheet data found.

## 2020-12-03 NOTE — NC FL2 (Addendum)
[place] LEVEL OF CARE SCREENING TOOL     IDENTIFICATION  Patient Name: Steve Ramirez Birthdate: August 30, 1935 Sex: male Admission Date (Current Location): 12/01/2020  [facility] and [place] Number:  Engineering geologist and Address:  [facility], [address], [place], [place] [address]      Provider Number: [id number]  Attending Physician Name and Address:  Ezekiel Slocumb, DO  Relative Name and Phone Number:  [name] (son) [phone]    Current Level of Care: Hospital Recommended Level of Care: [place] Prior Approval Number:    Date Approved/Denied:   PASRR Number: [id number]  Discharge Plan: ALF    Current Diagnoses: Patient Active Problem List   Diagnosis Date Noted   Sepsis due to pneumonia ([place]) 12/02/2020   CAP (community acquired pneumonia) 12/02/2020   Pressure injury of skin 11/12/2020   AMS (altered mental status) 11/11/2020   Altered mental status 11/09/2020   Hypertensive urgency 11/09/2020   Depression 10/03/2020   Hypothyroidism 10/03/2020   CAD (coronary artery disease) 34/28/7681   Acute metabolic encephalopathy 15/72/6203   Primary osteoarthritis of left hip 09/14/2018   Primary localized osteoarthritis of hip 08/02/2018   Primary osteoarthritis of right hip 07/07/2018   Near syncope 12/03/2016   CVA (cerebral vascular accident) ([place]) 12/03/2016   Right hemiparesis ([place]) 12/02/2016   Acute ischemic stroke ([place]) 12/02/2016   Essential hypertension 12/02/2016   Dysphagia 12/02/2016   Acute CVA (cerebrovascular accident) ([place]) 09/13/2015   TIA (transient ischemic attack) 11/29/2014   Shoulder stiffness 09/05/2012   Right rotator cuff tear 08/16/2012   Shoulder subluxation, right 08/09/2012   Elevated transaminase level 11/05/2011   Coronary atherosclerosis of native coronary artery 11/26/2010   Essential hypertension, benign 11/23/2010   Diabetes mellitus without complication  ([place]) 55/97/4163   Hyperlipidemia 11/23/2010   History of TIAs 11/23/2010    Orientation RESPIRATION BLADDER Height & Weight     Self, Situation  Normal Continent Weight: 81.6 kg Height:  5\' 8"  (172.7 cm)  BEHAVIORAL SYMPTOMS/MOOD NEUROLOGICAL BOWEL NUTRITION STATUS      Continent Diet (regular)  AMBULATORY STATUS COMMUNICATION OF NEEDS Skin   Extensive Assist Verbally Normal                       Personal Care Assistance Level of Assistance  Bathing, Feeding, Dressing, Total care Bathing Assistance: Independent Feeding assistance: Independent Dressing Assistance: Independent Total Care Assistance: Independent   Functional Limitations Info  Sight, Hearing, Speech Sight Info: Impaired Hearing Info: Impaired Speech Info: Adequate    SPECIAL CARE FACTORS FREQUENCY  PT (By licensed PT)     PT Frequency: 2 times per week              Contractures Contractures Info: Not present    Additional Factors Info  Code Status, Allergies Code Status Info: full code Allergies Info: NKDA           Current Medications (12/03/2020):  This is the current hospital active medication list Current Facility-Administered Medications  Medication Dose Route Frequency Provider Last Rate Last Admin   acetaminophen (TYLENOL) tablet 650 mg  650 mg Oral Q6H PRN Mansy, Jan A, MD       Or   acetaminophen (TYLENOL) suppository 650 mg  650 mg Rectal Q6H PRN Mansy, Jan A, MD       amLODipine (NORVASC) tablet 10 mg  10 mg Oral Daily Mansy, Jan A, MD   10 mg at  12/03/20 1026   aspirin EC tablet 81 mg  81 mg Oral Daily Nicole Kindred A, DO   81 mg at 12/03/20 1026   azithromycin (ZITHROMAX) 500 mg in sodium chloride 0.9 % 250 mL IVPB  500 mg Intravenous Q24H Mansy, Jan A, MD 250 mL/hr at 12/03/20 0035 500 mg at 12/03/20 0035   cefTRIAXone (ROCEPHIN) 2 g in sodium chloride 0.9 % 100 mL IVPB  2 g Intravenous Q24H Mansy, Jan A, MD 200 mL/hr at 12/02/20 2341 2 g at 12/02/20 2341    chlorpheniramine-HYDROcodone (TUSSIONEX) 10-8 MG/5ML suspension 5 mL  5 mL Oral Q12H PRN Mansy, Jan A, MD       [START ON 12/07/2020] cloNIDine (CATAPRES - Dosed in mg/24 hr) patch 0.3 mg  0.3 mg Transdermal Q Sat Mansy, Jan A, MD       clopidogrel (PLAVIX) tablet 75 mg  75 mg Oral Daily Mansy, Jan A, MD   75 mg at 12/03/20 1026   enoxaparin (LOVENOX) injection 40 mg  40 mg Subcutaneous Q24H Mansy, Jan A, MD   40 mg at 12/03/20 1027   ferrous sulfate tablet 325 mg  325 mg Oral Q breakfast Mansy, Jan A, MD   325 mg at 12/03/20 1026   guaiFENesin (MUCINEX) 12 hr tablet 600 mg  600 mg Oral BID Mansy, Jan A, MD   600 mg at 12/03/20 1026   ipratropium-albuterol (DUONEB) 0.5-2.5 (3) MG/3ML nebulizer solution 3 mL  3 mL Nebulization BID Nicole Kindred A, DO       isosorbide mononitrate (IMDUR) 24 hr tablet 30 mg  30 mg Oral QPM Mansy, Jan A, MD   30 mg at 12/02/20 1727   levothyroxine (SYNTHROID) tablet 50 mcg  50 mcg Oral Q0600 Mansy, Jan A, MD   50 mcg at 12/03/20 0557   loratadine (CLARITIN) tablet 10 mg  10 mg Oral Daily Mansy, Jan A, MD   10 mg at 12/03/20 1026   magnesium hydroxide (MILK OF MAGNESIA) suspension 30 mL  30 mL Oral Daily PRN Mansy, Jan A, MD       metoprolol succinate (TOPROL-XL) 24 hr tablet 25 mg  25 mg Oral QPM Mansy, Jan A, MD   25 mg at 12/02/20 1727   multivitamin with minerals tablet 1 tablet  1 tablet Oral Daily Mansy, Jan A, MD   1 tablet at 12/03/20 1026   nitroGLYCERIN (NITROSTAT) SL tablet 0.4 mg  0.4 mg Sublingual Q5 min PRN Mansy, Jan A, MD       omega-3 acid ethyl esters (LOVAZA) capsule 1 g  1 g Oral QHS Mansy, Jan A, MD   1 g at 12/02/20 2111   pravastatin (PRAVACHOL) tablet 20 mg  20 mg Oral QHS Mansy, Jan A, MD   20 mg at 12/02/20 2111   rOPINIRole (REQUIP) tablet 1 mg  1 mg Oral QHS Mansy, Jan A, MD   1 mg at 12/02/20 2111   sodium chloride 0.9 % bolus 500 mL  500 mL Intravenous Once Mansy, Arvella Merles, MD   Held at 12/02/20 0515   traZODone (DESYREL) tablet 25 mg  25 mg  Oral QHS PRN Mansy, Jan A, MD       vitamin B-12 (CYANOCOBALAMIN) tablet 1,000 mcg  1,000 mcg Oral Daily Mansy, Jan A, MD   1,000 mcg at 12/03/20 1026     Discharge Medications: Please see discharge summary for a list of discharge medications.  Relevant Imaging Results:  Relevant Lab Results:  Additional Information SSN: [id number]  Conception Oms, RN

## 2020-12-03 NOTE — Progress Notes (Signed)
Discharge Note: Reviewed discharge with pt and son. Pt and son verbalized understanding. Iv catheters intact when removed. Pt discharged with all personal belongings. Pt transported from [facility] to [facility] facility by family private transportation.

## 2020-12-03 NOTE — Discharge Summary (Signed)
Physician Discharge Summary  HARMAN LANGHANS XVQ:[id number] DOB: Jun 24, 1935 DOA: 12/01/2020  PCP: Sharilyn Sites, MD  Admit date: 12/01/2020 Discharge date: 12/03/2020  Admitted From: SNF Disposition:  ALF  Recommendations for Outpatient Follow-up:  Follow up with PCP in 1-2 weeks Please obtain BMP/CBC in one week Please follow up on patient's recovery from pneumonia Follow up on BP control  Home Health: PT  Equipment/Devices: None   Discharge Condition: Stable  CODE STATUS: Full  Diet recommendation: Heart Healthy / Carb Modified     Discharge Diagnoses: Principal Problem:   Sepsis due to pneumonia [facility]) Active Problems:   CAP (community acquired pneumonia)    Summary of HPI and Hospital Course:  Per H&P by Dr. Sidney Ace: "Steve Ramirez is a 85 y.o. Caucasian male with medical history significant for coronary artery disease, essential hypertension, TIA/CVA and type 2 diabetes mellitus, who presented to the ER with acute onset of worsening dyspnea with associated dry cough and wheezing over the last couple of days.  He has been treated for confusion and metabolic encephalopathy and influenza A for which he was hospitalized here on 10/29-11/8.  He denies any fever or chills.  No nausea or vomiting or abdominal pain.  No chest pain or palpitations.  No dysuria, oliguria or hematuria or flank pain.  ED Course: When he came to the ER vital signs were within normal except for blood pressure was 88/48 with heart rate of 58.  Lactic acid was 4.7 initially and later 8.3.  CMP was remarkable for hyperkalemia 5.8 and CO2 was 20 with a BUN of 37 and creatinine 1.39.  PCT was 1.25 EKG as reviewed by me : EKG showed normal sinus rhythm rate 66 with chronic PR interval, right bundle branch block and left intrafascicular block with T wave inversion inferiorly and laterally. Imaging: Chest x-ray showed right midlung zone focal pneumonic infiltrate.  The patient was given IV Rocephin and  Zithromax as well as vancomycin and 2.5 L bolus of IV lactated ringer.  He will be admitted to a medical telemetry bed for further evaluation and management."  11/22: Pt noted marked clinical improvement and eager to go home where his wife is expected to return today from [facility] also.  He is happy not to need to return to rehab since he was soon be discharged from there.   1.  Right-sided community-acquired pneumonia. Treated with IV Rocephin and Zithromax. Supportive care with mucolytics, Duonebs Discharge on Omnicef and Zithromax to complete course PCP follow up  2.  Acute kidney injury likely prerenal, due to hypovolemia. - hydrated with IV normal saline, improved --PCP for follow up BMP.  3.  Hyperkalemia.  likely related to his AKI.  Treated with D50 insulin.  Resolved.  4.  Lactic acidosis.  The patient does not meet criteria for sepsis and this likely secondary to volume depletion and dehydration.   Improved with IV normal saline and will follow his BMP and lactic as well as PCT.   5.  Essential hypertension.  continue amlodipine and Catapres patch, Toprol-XL.  Held Zestril given acute kidney injury.  6.  Type II diabetes mellitus.   Covered with sliding scale NovoLog.  Metformin held given acute kidney injury.  7.  Coronary artery disease.  continue PRN sublingual nitroglycerin, Imdur, statin therapy and beta-blocker therapy as well as aspirin.  8.  Hypothyroidism. continue Synthroid.   Discharge Instructions   Discharge Instructions     Call MD for:  extreme fatigue  Complete by: As directed    Call MD for:  persistant dizziness or light-headedness   Complete by: As directed    Call MD for:  persistant nausea and vomiting   Complete by: As directed    Call MD for:  severe uncontrolled pain   Complete by: As directed    Call MD for:  temperature >100.4   Complete by: As directed    Diet - low sodium heart healthy   Complete by: As directed    Diet - low sodium  heart healthy   Complete by: As directed    Discharge instructions   Complete by: As directed    Please continue taking both antibiotics for another 4 days to clear up your pneumonia.  If you start having fevers/chills, worsening cough, worsening shortness of breath, please contact your primary care doctor or go to urgent care or the ER for evaluation.  Physical therapy felt you are strong enough to go back home to [place].  We will arrange home health physical therapy to keep working with you there to get stronger.   I hope you and your family have a Happy Thanksgiving!   --Dr. Nicole Kindred, DO   [facility]   [facility]   Discharge instructions   Complete by: As directed    Please continue taking antibiotics as prescribed at home until they are gone.  Please be sure to follow up with your Primary Care provider within 1-2 weeks to be sure you are recovering well from pneumonia.  If you start becoming more short of breath, have fevers or chills, worsening cough despite antibiotics, call your doctor to be seen as soon as possible or go to urgent care or the ER for evaluation.   Discharge wound care:   Complete by: As directed    Increase activity slowly   Complete by: As directed    Increase activity slowly   Complete by: As directed    No wound care   Complete by: As directed       Allergies as of 12/03/2020   No Known Allergies      Medication List     TAKE these medications    acetaminophen 325 MG tablet Commonly known as: TYLENOL Take 2 tablets (650 mg total) by mouth every 4 (four) hours as needed for mild pain (or temp > 37.5 C (99.5 F)).   amLODipine 10 MG tablet Commonly known as: NORVASC Take 1 tablet (10 mg total) by mouth daily.   aspirin EC 81 MG tablet Take 1 tablet (81 mg total) by mouth 2 (two) times daily. For DVT prophylaxis for 30 days after surgery.   azithromycin 500 MG tablet Commonly known as: Zithromax Take 1 tablet (500 mg total) by  mouth daily for 4 days. Take 1 tablet daily for 3 days.   cefdinir 300 MG capsule Commonly known as: OMNICEF Take 1 capsule (300 mg total) by mouth 2 (two) times daily for 4 days.   cetirizine 10 MG tablet Commonly known as: ZYRTEC Take 10 mg by mouth daily.   cloNIDine 0.3 mg/24hr patch Commonly known as: CATAPRES - Dosed in mg/24 hr Place 1 patch (0.3 mg total) onto the skin once a week. What changed: when to take this   clopidogrel 75 MG tablet Commonly known as: PLAVIX Take 1 tablet (75 mg total) by mouth daily.   ferrous sulfate 325 (65 FE) MG tablet Take 325 mg by mouth daily with breakfast.   Fish Oil 1000  MG Caps Take 1,000 mg by mouth at bedtime.   guaiFENesin 600 MG 12 hr tablet Commonly known as: MUCINEX Take 1 tablet (600 mg total) by mouth 2 (two) times daily for 5 days.   ipratropium-albuterol 0.5-2.5 (3) MG/3ML Soln Commonly known as: DUONEB Take 3 mLs by nebulization every 4 (four) hours as needed.   isosorbide mononitrate 30 MG 24 hr tablet Commonly known as: IMDUR Take 30 mg by mouth every evening.   levothyroxine 50 MCG tablet Commonly known as: SYNTHROID Take 1 tablet (50 mcg total) by mouth daily.   lisinopril 40 MG tablet Commonly known as: ZESTRIL Take 1 tablet (40 mg total) by mouth daily.   metFORMIN 1000 MG tablet Commonly known as: GLUCOPHAGE Take 1,000 mg by mouth 2 (two) times daily.   metoprolol succinate 25 MG 24 hr tablet Commonly known as: TOPROL-XL Take 25 mg by mouth every evening.   multivitamin tablet Take 1 tablet by mouth daily.   nitroGLYCERIN 0.4 MG SL tablet Commonly known as: NITROSTAT Place 0.4 mg under the tongue every 5 (five) minutes as needed for chest pain.   pravastatin 20 MG tablet Commonly known as: PRAVACHOL Take 20 mg by mouth at bedtime.   rOPINIRole 0.5 MG tablet Commonly known as: REQUIP Take 2 tablets (1 mg total) by mouth at bedtime.   spironolactone 25 MG tablet Commonly known as:  ALDACTONE Take 1 tablet (25 mg total) by mouth daily.   vitamin B-12 1000 MCG tablet Commonly known as: CYANOCOBALAMIN Take 1 tablet (1,000 mcg total) by mouth daily.               Discharge Care Instructions  (From admission, onward)           Start     Ordered   12/03/20 0000  Discharge wound care:        12/03/20 1328            No Known Allergies   If you experience worsening of your admission symptoms, develop shortness of breath, life threatening emergency, suicidal or homicidal thoughts you must seek medical attention immediately by calling 911 or calling your MD immediately  if symptoms less severe.    Please note   You were cared for by a hospitalist during your hospital stay. If you have any questions about your discharge medications or the care you received while you were in the hospital after you are discharged, you can call the unit and asked to speak with the hospitalist on call if the hospitalist that took care of you is not available. Once you are discharged, your primary care physician will handle any further medical issues. Please note that NO REFILLS for any discharge medications will be authorized once you are discharged, as it is imperative that you return to your primary care physician (or establish a relationship with a primary care physician if you do not have one) for your aftercare needs so that they can reassess your need for medications and monitor your lab values.   Consultations: None   Procedures/Studies: DG Chest 1 View  Result Date: 11/09/2020 CLINICAL DATA:  Hypertension EXAM: CHEST  1 VIEW COMPARISON:  11/03/2020 FINDINGS: Lungs volumes are small, but are symmetric and are clear. No pneumothorax or pleural effusion. Cardiac size within normal limits. Pulmonary vascularity is normal. Osseous structures are age-appropriate. No acute bone abnormality. IMPRESSION: Pulmonary hypoinflation Electronically Signed   By: Fidela Salisbury M.D.    On: 11/09/2020 20:48   DG Chest  2 View  Result Date: 12/02/2020 CLINICAL DATA:  Altered mental status EXAM: CHEST - 2 VIEW COMPARISON:  11/09/2020 FINDINGS: There is focal consolidation within the right mid lung zone in keeping with changes of acute infection in the appropriate clinical setting. No pneumothorax or pleural effusion. Cardiac size within normal limits. No acute bone abnormality. IMPRESSION: Right mid lung zone focal pneumonic infiltrate. Electronically Signed   By: Fidela Salisbury M.D.   On: 12/02/2020 00:21   CT Head Wo Contrast  Result Date: 12/02/2020 CLINICAL DATA:  Altered mental status EXAM: CT HEAD WITHOUT CONTRAST TECHNIQUE: Contiguous axial images were obtained from the base of the skull through the vertex without intravenous contrast. COMPARISON:  11/09/2020 FINDINGS: Brain: No evidence of acute infarction, hemorrhage, hydrocephalus, extra-axial collection or mass lesion/mass effect. Chronic atrophic and ischemic changes are noted. Chronic lacunar infarcts are noted in region the thalami and basal ganglia bilaterally stable from the prior study. Vascular: No hyperdense vessel or unexpected calcification. Skull: Normal. Negative for fracture or focal lesion. Sinuses/Orbits: No acute finding. Other: None. IMPRESSION: Chronic atrophic and ischemic changes stable prior exam. Electronically Signed   By: Inez Catalina M.D.   On: 12/02/2020 00:04   CT HEAD WO CONTRAST  Result Date: 11/09/2020 CLINICAL DATA:  Weakness and vomiting. EXAM: CT HEAD WITHOUT CONTRAST TECHNIQUE: Contiguous axial images were obtained from the base of the skull through the vertex without intravenous contrast. COMPARISON:  November 08, 2020 FINDINGS: Brain: There is mild cerebral atrophy with widening of the extra-axial spaces and ventricular dilatation. There are areas of decreased attenuation within the white matter tracts of the supratentorial brain, consistent with microvascular disease changes. Small,  chronic bilateral basal ganglia lacunar infarcts are seen. Vascular: No hyperdense vessel or unexpected calcification. Skull: Normal. Negative for fracture or focal lesion. Sinuses/Orbits: No acute finding. Other: None. IMPRESSION: 1. Generalized cerebral atrophy. 2. Small, chronic bilateral basal ganglia lacunar infarcts. 3. No acute intracranial abnormality. Electronically Signed   By: Virgina Norfolk M.D.   On: 11/09/2020 20:44   CT HEAD WO CONTRAST (5MM)  Result Date: 11/08/2020 CLINICAL DATA:  Transient ischemic attack (TIA) EXAM: CT HEAD WITHOUT CONTRAST TECHNIQUE: Contiguous axial images were obtained from the base of the skull through the vertex without intravenous contrast. COMPARISON:  November 03, 2020. FINDINGS: Brain: Similar remote infarcts in the left thalamus and posterior limb the internal capsule. Similar advanced patchy moderate to advanced white matter hypoattenuation, nonspecific but compatible with chronic microvascular ischemic disease. No evidence of acute large vascular territory infarct. No acute hemorrhage, hydrocephalus, mass lesion midline shift. Mild for age atrophy. Vascular: No hyperdense vessel identified. Calcific intracranial atherosclerosis. Skull: No acute fracture. Sinuses/Orbits: Mild mucosal thickening in the visualized sinuses. Unremarkable orbits. Other: No mastoid effusions. IMPRESSION: 1. No evidence of acute intracranial abnormality. 2. Remote infarcts in the left thalamus/posterior limb internal capsule. 3. Advanced chronic microvascular disease. Electronically Signed   By: Margaretha Sheffield M.D.   On: 11/08/2020 11:22   MR BRAIN WO CONTRAST  Result Date: 11/09/2020 CLINICAL DATA:  Follow-up examination for acute stroke. EXAM: MRI HEAD WITHOUT CONTRAST TECHNIQUE: Multiplanar, multiecho pulse sequences of the brain and surrounding structures were obtained without intravenous contrast. COMPARISON:  Prior head CT from earlier the same day as well as recent MRI  from 11/03/2020. FINDINGS: Brain: Diffuse prominence of the CSF containing spaces compatible generalized age-related cerebral atrophy. Patchy and confluent T2/FLAIR hyperintensity involving the periventricular and deep white matter both cerebral hemispheres most consistent with chronic small  vessel ischemic disease, moderately advanced in nature. Few scattered remote lacunar infarcts present about the posterior limb of the left internal capsule, left thalamus, bilateral basal ganglia, and right pons. No abnormal foci of restricted diffusion to suggest acute or subacute ischemia. Gray-white matter differentiation otherwise maintained. No encephalomalacia to suggest chronic cortical infarction. No acute or chronic intracranial hemorrhage. No mass lesion, midline shift or mass effect. No hydrocephalus or extra-axial fluid collection. Pituitary gland suprasellar region within normal limits. Midline structures intact. Vascular: Major intracranial vascular flow voids are maintained. Skull and upper cervical spine: Craniocervical junction within normal limits. Bone marrow signal intensity normal. No scalp soft tissue abnormality. Sinuses/Orbits: Globes and orbital soft tissues demonstrate no acute finding. Mild mucosal thickening noted within the ethmoidal air cells and maxillary sinuses. Paranasal sinuses are otherwise clear. Trace left mastoid effusion noted, of doubtful significance. Inner ear structures grossly normal. Other: None. IMPRESSION: 1. No acute intracranial abnormality. 2. Moderately advanced cerebral atrophy with chronic microvascular ischemic disease, with a few scattered remote lacunar infarcts involving the posterior limb of the left internal capsule, left thalamus, bilateral basal ganglia, and right pons. Electronically Signed   By: Jeannine Boga M.D.   On: 11/09/2020 23:26   MR BRAIN WO CONTRAST  Result Date: 11/03/2020 CLINICAL DATA:  Neuro deficit, acute, stroke suspected. Speech  disturbance. Weakness. EXAM: MRI HEAD WITHOUT CONTRAST TECHNIQUE: Multiplanar, multiecho pulse sequences of the brain and surrounding structures were obtained without intravenous contrast. COMPARISON:  Head CT same day.  MRI 10/03/2020. FINDINGS: Brain: Diffusion imaging does not show any acute or subacute stroke or other cause of restricted diffusion. No focal abnormality seen affecting the brainstem or cerebellum. There are old lacunar infarctions in the left thalamus and posterior limb internal capsule. Extensive chronic small-vessel ischemic changes are present throughout the cerebral hemispheric white matter. No mass, hemorrhage, hydrocephalus or extra-axial collection. Vascular: Major vessels at the base of the brain show flow. Skull and upper cervical spine: Negative Sinuses/Orbits: Clear/normal Other: None IMPRESSION: No acute finding. Chronic small-vessel ischemic changes of the cerebral hemispheric white matter. Old lacunar infarctions left thalamus and posterior limb internal capsule. Electronically Signed   By: Nelson Chimes M.D.   On: 11/03/2020 16:40   MR BRAIN W CONTRAST  Result Date: 11/10/2020 CLINICAL DATA:  Seizure with abnormal neuro exam EXAM: MRI HEAD WITH CONTRAST TECHNIQUE: Multiplanar, multiecho pulse sequences of the brain and surrounding structures were obtained with intravenous contrast. CONTRAST:  7.10mL GADAVIST GADOBUTROL 1 MMOL/ML IV SOLN COMPARISON:  Noncontrast brain MRI from yesterday FINDINGS: Brain: No abnormal enhancement. Parenchymal changes as described on full brain MRI yesterday. No interval finding. Vascular: Major vessels are enhancing. Skull and upper cervical spine: No abnormal enhancement Sinuses/Orbits: Negative IMPRESSION: No abnormal enhancement. Electronically Signed   By: Jorje Guild M.D.   On: 11/10/2020 11:02   DG Chest Port 1 View  Result Date: 11/03/2020 CLINICAL DATA:  Altered mental status. EXAM: PORTABLE CHEST 1 VIEW COMPARISON:  10/03/2020  FINDINGS: Heart is enlarged. There are no focal consolidations or pleural effusions. Mild perihilar peribronchial thickening. IMPRESSION: Stable cardiomegaly.  Bronchitic changes. Electronically Signed   By: Nolon Nations M.D.   On: 11/03/2020 18:58   EEG adult  Result Date: 11/13/2020 Derek Jack, MD     11/13/2020  7:18 PM Routine EEG Report Steve Ramirez is a 85 y.o. male with a history of spells who is undergoing an EEG to evaluate for seizures. Report: This EEG was acquired with electrodes placed  according to the International 10-20 electrode system (including Fp1, Fp2, F3, F4, C3, C4, P3, P4, O1, O2, T3, T4, T5, T6, A1, A2, Fz, Cz, Pz). The following electrodes were missing or displaced: none. The occipital dominant rhythm was 8.5 Hz. This activity is reactive to stimulation. Drowsiness was manifested by background fragmentation; deeper stages of sleep were not identified. There was no focal slowing. There were no interictal epileptiform discharges. There were no electrographic seizures identified. There was no abnormal response to photic stimulation or hyperventilation. Impression: This EEG was obtained while awake and drowsy and is normal.   Clinical Correlation: Normal EEGs, however, do not rule out epilepsy. Su Monks, MD [facility] [phone] If 7pm- 7am, please page neurology on call as listed in [place].   EEG adult  Result Date: 11/11/2020 Lora Havens, MD     11/11/2020  5:33 PM Patient Name: Steve Ramirez MRN: [id number] Epilepsy Attending: Lora Havens Referring Physician/Provider: Dr. Su Monks Date: 11/11/2020 Duration: 20.36 mins Patient history: 85 year old male with altered mental status.  EEG to evaluate for seizure. Level of alertness: Awake, asleep AEDs during EEG study: None Technical aspects: This EEG study was done with scalp electrodes positioned according to the 10-20 International system of electrode placement. Electrical  activity was acquired at a sampling rate of 500Hz  and reviewed with a high frequency filter of 70Hz  and a low frequency filter of 1Hz . EEG data were recorded continuously and digitally stored. Description: No clear posterior dominant rhythm was seen. Sleep was characterized by vertex waves, sleep spindles (12 to 14 Hz), maximal frontocentral region. EEG showed continuous generalized 3 to 6 Hz theta-delta slowing. Generalized periodic discharges with triphasic morphology at 1Hz  were also noted, more prominent when awake/stimulated. Hyperventilation and photic stimulation were not performed.   ABNORMALITY - Periodic discharges with triphasic morphology, generalized ( GPDs) - Continuous slow, generalized IMPRESSION: This study showed periodic discharges with triphasic morphology which can be on the ictal-interictal continuum. However, the morphology, frequency and reactivity to stimulation is more likely indicative of toxic-metabolic causes. There is also moderate diffuse encephalopathy, nonspecific etiology. No seizures or definite epileptiform discharges were seen throughout the recording. Lora Havens   EEG adult  Result Date: 11/04/2020 Derek Jack, MD     11/04/2020  2:13 PM Routine EEG Report KERRICK MILER is a 85 y.o. male with a history of spells who is undergoing an EEG to evaluate for seizures. Report: This EEG was acquired with electrodes placed according to the International 10-20 electrode system (including Fp1, Fp2, F3, F4, C3, C4, P3, P4, O1, O2, T3, T4, T5, T6, A1, A2, Fz, Cz, Pz). The following electrodes were missing or displaced: none. The occipital dominant rhythm was 7 Hz. This activity is reactive to stimulation. Drowsiness was manifested by background fragmentation; deeper stages of sleep were identified by K complexes and sleep spindles. There was no focal slowing. There were no interictal epileptiform discharges. There were no electrographic seizures identified. There was no  abnormal response to photic stimulation. Hyperventilation was not performed. Impression and clinical correlation: This EEG was obtained while awake and asleep and is abnormal due to mild diffuse slowing. There were no epileptiform abnl observed during this recording. Su Monks, MD [facility] [phone] If 7pm- 7am, please page neurology on call as listed in [place].   CT HEAD CODE STROKE WO CONTRAST  Result Date: 11/03/2020 CLINICAL DATA:  Code stroke. Neuro deficit, acute, stroke suspected. Speech disturbance. Weakness. EXAM: CT HEAD WITHOUT  CONTRAST TECHNIQUE: Contiguous axial images were obtained from the base of the skull through the vertex without intravenous contrast. COMPARISON:  MRI 10/03/2020.  CT 10/03/2020. FINDINGS: Brain: Generalized atrophy. Extensive chronic small-vessel ischemic changes of the cerebral hemispheric white matter. Old lacunar infarctions of the left thalamus and posterior limb internal capsule. No sign of acute infarction, mass lesion, hemorrhage, hydrocephalus or extra-axial collection. Vascular: There is atherosclerotic calcification of the major vessels at the base of the brain. Skull: Negative Sinuses/Orbits: Clear Other: None ASPECTS ([place] Stroke Program Early CT Score) - Ganglionic level infarction (caudate, lentiform nuclei, internal capsule, insula, M1-M3 cortex): 7 - Supraganglionic infarction (M4-M6 cortex): 3 Total score (0-10 with 10 being normal): 10 IMPRESSION: 1. No acute CT finding. Atrophy and extensive chronic small-vessel ischemic changes. Old lacunar infarctions left thalamus and posterior limb internal capsule. 2. ASPECTS is 10. 3. These results were called by telephone at the time of interpretation on 11/03/2020 at 4:05 pm to provider [facility] , who verbally acknowledged these results. Electronically Signed   By: Nelson Chimes M.D.   On: 11/03/2020 16:07       Subjective: Pt reports he feels better.  Cough improved.  Denies fever,  chills, SOB, chest pain.  Eager to return to his ALF, wife supposed to be d/c from [facility] also.   Discharge Exam: Vitals:   12/03/20 0759 12/03/20 1136  BP: 130/85 (!) 125/50  Pulse: 74 70  Resp: 14 16  Temp: 97.9 F (36.6 C) 98.4 F (36.9 C)  SpO2: 95% 93%   Vitals:   12/03/20 0045 12/03/20 0536 12/03/20 0759 12/03/20 1136  BP: 131/63 119/64 130/85 (!) 125/50  Pulse: 77 82 74 70  Resp: 17 18 14 16   Temp: 97.9 F (36.6 C) 98.5 F (36.9 C) 97.9 F (36.6 C) 98.4 F (36.9 C)  TempSrc:    Oral  SpO2: 97% 96% 95% 93%  Weight:      Height:        General: Pt is alert, awake, not in acute distress Cardiovascular: RRR, S1/S2 +, no rubs, no gallops Respiratory: CTA bilaterally, no wheezing, no rhonchi Abdominal: Soft, NT, ND, bowel sounds + Extremities: no edema, no cyanosis    The results of significant diagnostics from this hospitalization (including imaging, microbiology, ancillary and laboratory) are listed below for reference.     Microbiology: Recent Results (from the past 240 hour(s))  Culture, blood (routine x 2)     Status: None (Preliminary result)   Collection Time: 12/01/20 10:45 PM   Specimen: BLOOD  Result Value Ref Range Status   Specimen Description BLOOD LARM  Final   Special Requests   Final    BOTTLES DRAWN AEROBIC AND ANAEROBIC Blood Culture results may not be optimal due to an inadequate volume of blood received in culture bottles   Culture   Final    NO GROWTH 2 DAYS Performed at [facility], [address], [place], [place] [address]    Report Status PENDING  Incomplete  Culture, blood (routine x 2)     Status: None (Preliminary result)   Collection Time: 12/01/20 10:45 PM   Specimen: BLOOD  Result Value Ref Range Status   Specimen Description BLOOD RHAND  Final   Special Requests   Final    BOTTLES DRAWN AEROBIC AND ANAEROBIC Blood Culture adequate volume   Culture   Final    NO GROWTH 2 DAYS Performed at [facility],  [address] [place], [place] [address]  Report Status PENDING  Incomplete  Resp Panel by RT-PCR (Flu A&B, Covid) Nasopharyngeal Swab     Status: None   Collection Time: 12/02/20  1:23 AM   Specimen: Nasopharyngeal Swab; Nasopharyngeal(NP) swabs in vial transport medium  Result Value Ref Range Status   SARS Coronavirus 2 by RT PCR NEGATIVE NEGATIVE Final    Comment: (NOTE) SARS-CoV-2 target nucleic acids are NOT DETECTED.  The SARS-CoV-2 RNA is generally detectable in upper respiratory specimens during the acute phase of infection. The lowest concentration of SARS-CoV-2 viral copies this assay can detect is 138 copies/mL. A negative result does not preclude SARS-Cov-2 infection and should not be used as the sole basis for treatment or other patient management decisions. A negative result may occur with  improper specimen collection/handling, submission of specimen other than nasopharyngeal swab, presence of viral mutation(s) within the areas targeted by this assay, and inadequate number of viral copies(<138 copies/mL). A negative result must be combined with clinical observations, patient history, and epidemiological information. The expected result is Negative.  Fact Sheet for Patients:  EntrepreneurPulse.com.au  Fact Sheet for Healthcare Providers:  IncredibleEmployment.be  This test is no t yet approved or cleared by the [place] FDA and  has been authorized for detection and/or diagnosis of SARS-CoV-2 by FDA under an Emergency Use Authorization (EUA). This EUA will remain  in effect (meaning this test can be used) for the duration of the COVID-19 declaration under Section 564(b)(1) of the Act, 21 U.S.C.section 360bbb-3(b)(1), unless the authorization is terminated  or revoked sooner.       Influenza A by PCR NEGATIVE NEGATIVE Final   Influenza B by PCR NEGATIVE NEGATIVE Final    Comment: (NOTE) The Xpert Xpress  SARS-CoV-2/FLU/RSV plus assay is intended as an aid in the diagnosis of influenza from Nasopharyngeal swab specimens and should not be used as a sole basis for treatment. Nasal washings and aspirates are unacceptable for Xpert Xpress SARS-CoV-2/FLU/RSV testing.  Fact Sheet for Patients: EntrepreneurPulse.com.au  Fact Sheet for Healthcare Providers: IncredibleEmployment.be  This test is not yet approved or cleared by the [place] FDA and has been authorized for detection and/or diagnosis of SARS-CoV-2 by FDA under an Emergency Use Authorization (EUA). This EUA will remain in effect (meaning this test can be used) for the duration of the COVID-19 declaration under Section 564(b)(1) of the Act, 21 U.S.C. section 360bbb-3(b)(1), unless the authorization is terminated or revoked.  Performed at [facility], [address] [place], [place] [address]      Labs: BNP (last 3 results) No results for input(s): BNP in the last 8760 hours. Basic Metabolic Panel: Recent Labs  Lab 12/01/20 2244 12/02/20 0505 12/03/20 0314  NA 135 134* 138  K 5.8* 4.5 4.3  CL 105 106 110  CO2 20* 23 20*  GLUCOSE 157* 122* 111*  BUN 37* 30* 13  CREATININE 1.39* 0.99 0.60*  CALCIUM 9.3 8.5* 8.6*  MG  --   --  1.6*   Liver Function Tests: Recent Labs  Lab 12/01/20 2244  AST 17  ALT 14  ALKPHOS 62  BILITOT 0.5  PROT 7.3  ALBUMIN 4.0   No results for input(s): LIPASE, AMYLASE in the last 168 hours. No results for input(s): AMMONIA in the last 168 hours. CBC: Recent Labs  Lab 12/01/20 2244 12/02/20 0505 12/03/20 0314  WBC 12.5* 9.7  --   NEUTROABS 10.4*  --   --   HGB 12.3* 10.8* 10.6*  HCT 40.3 34.5* 33.3*  MCV 74.9* 74.0*  --   PLT 356 307  --    Cardiac Enzymes: No results for input(s): CKTOTAL, CKMB, CKMBINDEX, TROPONINI in the last 168 hours. BNP: Invalid input(s): POCBNP CBG: Recent Labs  Lab 12/03/20 0818  GLUCAP 136*    D-Dimer No results for input(s): DDIMER in the last 72 hours. Hgb A1c No results for input(s): HGBA1C in the last 72 hours. Lipid Profile No results for input(s): CHOL, HDL, LDLCALC, TRIG, CHOLHDL, LDLDIRECT in the last 72 hours. Thyroid function studies No results for input(s): TSH, T4TOTAL, T3FREE, THYROIDAB in the last 72 hours.  Invalid input(s): FREET3 Anemia work up No results for input(s): VITAMINB12, FOLATE, FERRITIN, TIBC, IRON, RETICCTPCT in the last 72 hours. Urinalysis    Component Value Date/Time   COLORURINE YELLOW (A) 12/01/2020 2319   APPEARANCEUR HAZY (A) 12/01/2020 2319   LABSPEC 1.017 12/01/2020 2319   PHURINE 5.0 12/01/2020 2319   GLUCOSEU NEGATIVE 12/01/2020 2319   HGBUR NEGATIVE 12/01/2020 2319   BILIRUBINUR NEGATIVE 12/01/2020 2319   KETONESUR NEGATIVE 12/01/2020 2319   PROTEINUR NEGATIVE 12/01/2020 2319   UROBILINOGEN 0.2 04/15/2010 1425   NITRITE NEGATIVE 12/01/2020 2319   LEUKOCYTESUR NEGATIVE 12/01/2020 2319   Sepsis Labs Invalid input(s): PROCALCITONIN,  WBC,  LACTICIDVEN Microbiology Recent Results (from the past 240 hour(s))  Culture, blood (routine x 2)     Status: None (Preliminary result)   Collection Time: 12/01/20 10:45 PM   Specimen: BLOOD  Result Value Ref Range Status   Specimen Description BLOOD LARM  Final   Special Requests   Final    BOTTLES DRAWN AEROBIC AND ANAEROBIC Blood Culture results may not be optimal due to an inadequate volume of blood received in culture bottles   Culture   Final    NO GROWTH 2 DAYS Performed at [facility], [address], [place], [place] [address]    Report Status PENDING  Incomplete  Culture, blood (routine x 2)     Status: None (Preliminary result)   Collection Time: 12/01/20 10:45 PM   Specimen: BLOOD  Result Value Ref Range Status   Specimen Description BLOOD RHAND  Final   Special Requests   Final    BOTTLES DRAWN AEROBIC AND ANAEROBIC Blood Culture adequate volume   Culture    Final    NO GROWTH 2 DAYS Performed at [facility], [address] [place], [place] [address]    Report Status PENDING  Incomplete  Resp Panel by RT-PCR (Flu A&B, Covid) Nasopharyngeal Swab     Status: None   Collection Time: 12/02/20  1:23 AM   Specimen: Nasopharyngeal Swab; Nasopharyngeal(NP) swabs in vial transport medium  Result Value Ref Range Status   SARS Coronavirus 2 by RT PCR NEGATIVE NEGATIVE Final    Comment: (NOTE) SARS-CoV-2 target nucleic acids are NOT DETECTED.  The SARS-CoV-2 RNA is generally detectable in upper respiratory specimens during the acute phase of infection. The lowest concentration of SARS-CoV-2 viral copies this assay can detect is 138 copies/mL. A negative result does not preclude SARS-Cov-2 infection and should not be used as the sole basis for treatment or other patient management decisions. A negative result may occur with  improper specimen collection/handling, submission of specimen other than nasopharyngeal swab, presence of viral mutation(s) within the areas targeted by this assay, and inadequate number of viral copies(<138 copies/mL). A negative result must be combined with clinical observations, patient history, and epidemiological information. The expected result is Negative.  Fact Sheet for Patients:  EntrepreneurPulse.com.au  Fact Sheet for Healthcare Providers:  IncredibleEmployment.be  This test is no t yet approved or cleared by the [place] FDA and  has been authorized for detection and/or diagnosis of SARS-CoV-2 by FDA under an Emergency Use Authorization (EUA). This EUA will remain  in effect (meaning this test can be used) for the duration of the COVID-19 declaration under Section 564(b)(1) of the Act, 21 U.S.C.section 360bbb-3(b)(1), unless the authorization is terminated  or revoked sooner.       Influenza A by PCR NEGATIVE NEGATIVE Final   Influenza B by PCR NEGATIVE  NEGATIVE Final    Comment: (NOTE) The Xpert Xpress SARS-CoV-2/FLU/RSV plus assay is intended as an aid in the diagnosis of influenza from Nasopharyngeal swab specimens and should not be used as a sole basis for treatment. Nasal washings and aspirates are unacceptable for Xpert Xpress SARS-CoV-2/FLU/RSV testing.  Fact Sheet for Patients: EntrepreneurPulse.com.au  Fact Sheet for Healthcare Providers: IncredibleEmployment.be  This test is not yet approved or cleared by the [place] FDA and has been authorized for detection and/or diagnosis of SARS-CoV-2 by FDA under an Emergency Use Authorization (EUA). This EUA will remain in effect (meaning this test can be used) for the duration of the COVID-19 declaration under Section 564(b)(1) of the Act, 21 U.S.C. section 360bbb-3(b)(1), unless the authorization is terminated or revoked.  Performed at [facility], [address] [place], [place] [address]      Time coordinating discharge: Over 30 minutes  SIGNED:   Ezekiel Slocumb, DO [facility] 12/03/2020, 1:28 PM   If 7PM-7AM, please contact night-coverage [email or web address]

## 2020-12-03 NOTE — Progress Notes (Signed)
Physical Therapy Treatment Patient Details Name: SHAHEEM PICHON MRN: [id number] DOB: 1935/01/16 Today's Date: 12/03/2020   History of Present Illness 85 y.o. male with medical history significant for coronary artery disease, essential hypertension, TIA/CVA (multiple) and type 2 diabetes mellitus, who presented to the ER with acute onset of worsening dyspnea with associated dry cough and wheezing over the last couple of days.  He has recently been treated for confusion, metabolic encephalopathy and influenza A (d/c on 11/8).    PT Comments    Pt received supine in bed, agreeable to therapy. He performed all mobility with SUP - provided for safety. Ambulation distance increased to 642f with no fatigue reported upon completion. He then followed ambulation with 15 squats, now reporting muscular fatigue. Pt will remain on PT caseload to maintain level of function and continue emphasizing safety awareness and falls prevention. Pt in chair at end of session, chair alarm activated and all needs met.    Recommendations for follow up therapy are one component of a multi-disciplinary discharge planning process, led by the attending physician.  Recommendations may be updated based on patient status, additional functional criteria and insurance authorization.  Follow Up Recommendations  Home health PT     Assistance Recommended at Discharge Intermittent Supervision/Assistance  Equipment Recommendations  None recommended by PT    Recommendations for Other Services       Precautions / Restrictions Precautions Precautions: Fall Restrictions Weight Bearing Restrictions: No     Mobility  Bed Mobility Overal bed mobility: Needs Assistance Bed Mobility: Supine to Sit     Supine to sit: Supervision     General bed mobility comments: SUP for safety with use of bed rails.    Transfers Overall transfer level: Needs assistance Equipment used: Rolling walker (2 wheels) Transfers: Sit  to/from Stand Sit to Stand: Supervision           General transfer comment: SUP for safety    Ambulation/Gait Ambulation/Gait assistance: Supervision Gait Distance (Feet): 600 Feet Assistive device: Rolling walker (2 wheels) Gait Pattern/deviations: Step-through pattern;Decreased weight shift to right       General Gait Details: SUP for safety. Pt reported no fatigue upon completion of 6025f Pt told stories throughout ambulation and scanned environment well with no LOB.   Stairs             Wheelchair Mobility    Modified Rankin (Stroke Patients Only)       Balance Overall balance assessment: Needs assistance Sitting-balance support: Feet supported;No upper extremity supported Sitting balance-Leahy Scale: Good     Standing balance support: Bilateral upper extremity supported;During functional activity Standing balance-Leahy Scale: Fair Standing balance comment: Does not rely on RW, no LOB.                            Cognition Arousal/Alertness: Awake/alert Behavior During Therapy: WFL for tasks assessed/performed Overall Cognitive Status: History of cognitive impairments - at baseline                                 General Comments: Pt maintained appropriate conversation throughout session.        Exercises Other Exercises Other Exercises: Pt performed 15 consecutive squats (almost touching chair) with muscular fatigue and mild SOB at completion.    General Comments        Pertinent Vitals/Pain Pain Assessment: No/denies pain  Home Living                          Prior Function            PT Goals (current goals can now be found in the care plan section) Acute Rehab PT Goals Patient Stated Goal: to go back to [facility] PT Goal Formulation: With patient Time For Goal Achievement: 12/16/20 Potential to Achieve Goals: Good    Frequency    Min 2X/week      PT Plan      Co-evaluation               AM-PAC PT "6 Clicks" Mobility   Outcome Measure  Help needed turning from your back to your side while in a flat bed without using bedrails?: None Help needed moving from lying on your back to sitting on the side of a flat bed without using bedrails?: None Help needed moving to and from a bed to a chair (including a wheelchair)?: A Little Help needed standing up from a chair using your arms (e.g., wheelchair or bedside chair)?: A Little Help needed to walk in hospital room?: A Little Help needed climbing 3-5 steps with a railing? : A Little 6 Click Score: 20    End of Session Equipment Utilized During Treatment: Gait belt Activity Tolerance: Patient tolerated treatment well Patient left: with call bell/phone within reach;in chair;with chair alarm set Nurse Communication: Mobility status PT Visit Diagnosis: Unsteadiness on feet (R26.81);Muscle weakness (generalized) (M62.81);History of falling (Z91.81);Difficulty in walking, not elsewhere classified (R26.2);Other abnormalities of gait and mobility (R26.89)     Time: 3794-3276 PT Time Calculation (min) (ACUTE ONLY): 39 min  Charges:  $Gait Training: 8-22 mins $Therapeutic Exercise: 23-37 mins                     Patrina Levering PT, DPT 12/03/20 12:17 PM [phone]

## 2020-12-03 NOTE — Evaluation (Signed)
Occupational Therapy Evaluation Patient Details Name: Steve Ramirez MRN: [id number] DOB: 05/26/1935 Today's Date: 12/03/2020   History of Present Illness 85 y.o. male with medical history significant for coronary artery disease, essential hypertension, TIA/CVA (multiple) and type 2 diabetes mellitus, who presented to the ER with acute onset of worsening dyspnea with associated dry cough and wheezing over the last couple of days.  He has recently been treated for confusion, metabolic encephalopathy and influenza A (d/c on 11/8).   Clinical Impression   Pt was seen for limited OT evaluation this date. Prior to hospital admission, pt lives at [place] with his wife, walks using Rollator and is Mod I with ADLs and was recently at rehab after previous admission. Pt endorses frustration and annoyance with OT's questions, stating he had a "mini-stroke" and he needs to be with his wife who is admitted at a different hospital. Currently pt demonstrates impairments as described below (See OT problem list) which functionally limit his ability to perform ADL/self-care tasks. Pt currently able to perform bed mobility to reposition himself without assist and requires PRN MIN A and anticipate supervision for safety with ADL/mobility. Pt would benefit from skilled OT services to address noted impairments and functional limitations (see below for any additional details) in order to maximize safety and independence while minimizing falls risk and caregiver burden. Upon hospital discharge, recommend return to ALF with HHOT to maximize pt safety and return to functional independence during meaningful occupations of daily life.    Recommendations for follow up therapy are one component of a multi-disciplinary discharge planning process, led by the attending physician.  Recommendations may be updated based on patient status, additional functional criteria and insurance authorization.   Follow Up  Recommendations  Home health OT    Assistance Recommended at Discharge Frequent or constant Supervision/Assistance  Functional Status Assessment  Patient has had a recent decline in their functional status and demonstrates the ability to make significant improvements in function in a reasonable and predictable amount of time.  Equipment Recommendations       Recommendations for Other Services       Precautions / Restrictions Precautions Precautions: Fall Restrictions Weight Bearing Restrictions: No      Mobility Bed Mobility      General bed mobility comments: pt declined    Transfers            General transfer comment: pt declined      Balance                            ADL either performed or assessed with clinical judgement   ADL                                         General ADL Comments: supervision for safety, PRN MIN A for bathing, dressing     Vision         Perception     Praxis      Pertinent Vitals/Pain Pain Assessment: No/denies pain     Hand Dominance Right   Extremity/Trunk Assessment Upper Extremity Assessment Upper Extremity Assessment: Overall WFL for tasks assessed   Lower Extremity Assessment Lower Extremity Assessment: Overall WFL for tasks assessed       Communication Communication Communication: No difficulties   Cognition Arousal/Alertness: Awake/alert Behavior During Therapy: WFL for tasks assessed/performed Overall  Cognitive Status: History of cognitive impairments - at baseline Area of Impairment: Orientation;Safety/judgement                 Orientation Level: Disoriented to;Situation       Safety/Judgement: Decreased awareness of deficits;Decreased awareness of safety     General Comments: reports being here due to a "mini stroke"     General Comments       Exercises    Shoulder Instructions      Home Living Family/patient expects to be discharged to::  Assisted living Living Arrangements: Spouse/significant other   Type of Home: Assisted living Home Access: Level entry     Home Layout: One level               Home Equipment: Standard Walker;Rollator (4 wheels);Grab bars - toilet;Grab bars - tub/shower;Shower seat   Additional Comments: Pt ambulates with Rollator at baseline.      Prior Functioning/Environment Prior Level of Function : Independent/Modified Independent             Mobility Comments: Per pt and family, pt ambulates using Rollator throughout ALF. Daughter endorses ~5 falls in the last 6 months however pt reports 0. Pt states he drives however this was not confirmed. ADLs Comments: Pt reports indep with ADLs. IADLs covered by ALF.        OT Problem List: Decreased strength;Decreased cognition;Impaired balance (sitting and/or standing);Decreased activity tolerance;Decreased knowledge of use of DME or AE      OT Treatment/Interventions: Self-care/ADL training;Therapeutic exercise;Therapeutic activities;DME and/or AE instruction;Patient/family education;Balance training    OT Goals(Current goals can be found in the care plan section) Acute Rehab OT Goals Patient Stated Goal: get better and go see my wife OT Goal Formulation: With patient Time For Goal Achievement: 12/17/20 Potential to Achieve Goals: Good ADL Goals Pt Will Perform Lower Body Dressing: with modified independence;sit to/from stand Pt Will Transfer to Toilet: with modified independence;ambulating Additional ADL Goal #1: Pt will verbalize plan to implement at least 2 learned falls prevention strategies.  OT Frequency: Min 2X/week   Barriers to D/C:            Co-evaluation              AM-PAC OT "6 Clicks" Daily Activity     Outcome Measure Help from another person eating meals?: None Help from another person taking care of personal grooming?: A Little Help from another person toileting, which includes using toliet, bedpan, or  urinal?: A Little Help from another person bathing (including washing, rinsing, drying)?: A Little Help from another person to put on and taking off regular upper body clothing?: A Little Help from another person to put on and taking off regular lower body clothing?: A Little 6 Click Score: 19   End of Session    Activity Tolerance: Other (comment) (pt endorsed frustration with situation, self limiting) Patient left:    OT Visit Diagnosis: Other abnormalities of gait and mobility (R26.89);Muscle weakness (generalized) (M62.81);Repeated falls (R29.6)                Time: 6644-0347 OT Time Calculation (min): 12 min Charges:  OT General Charges $OT Visit: 1 Visit OT Evaluation $OT Eval Low Complexity: 1 Low  Ardeth Perfect., MPH, MS, OTR/L ascom [phone] 12/03/20, 1:36 PM

## 2020-12-03 NOTE — Progress Notes (Signed)
Update given regarding patient to RN at [place] per pt's wife request. Patient information double verified.

## 2020-12-06 LAB — CULTURE, BLOOD (ROUTINE X 2)
Culture: NO GROWTH
Culture: NO GROWTH
Special Requests: ADEQUATE

## 2021-11-04 ENCOUNTER — Other Ambulatory Visit: Payer: Self-pay

## 2021-11-04 ENCOUNTER — Emergency Department (EMERGENCY_DEPARTMENT_HOSPITAL)
Admission: EM | Admit: 2021-11-04 | Discharge: 2021-11-06 | Disposition: A | Discharge status: Other Acute Inpatient Hospital | Payer: Medicare Other | Source: Home / Self Care | Attending: Emergency Medicine | Admitting: Emergency Medicine

## 2021-11-04 DIAGNOSIS — I25118 Atherosclerotic heart disease of native coronary artery with other forms of angina pectoris: Secondary | ICD-10-CM

## 2021-11-04 DIAGNOSIS — R45851 Suicidal ideations: Secondary | ICD-10-CM | POA: Insufficient documentation

## 2021-11-04 DIAGNOSIS — R7401 Elevation of levels of liver transaminase levels: Secondary | ICD-10-CM | POA: Insufficient documentation

## 2021-11-04 DIAGNOSIS — R4182 Altered mental status, unspecified: Secondary | ICD-10-CM | POA: Insufficient documentation

## 2021-11-04 DIAGNOSIS — I639 Cerebral infarction, unspecified: Secondary | ICD-10-CM | POA: Diagnosis present

## 2021-11-04 DIAGNOSIS — T50902A Poisoning by unspecified drugs, medicaments and biological substances, intentional self-harm, initial encounter: Secondary | ICD-10-CM | POA: Insufficient documentation

## 2021-11-04 DIAGNOSIS — F32A Depression, unspecified: Secondary | ICD-10-CM | POA: Diagnosis present

## 2021-11-04 DIAGNOSIS — Z8673 Personal history of transient ischemic attack (TIA), and cerebral infarction without residual deficits: Secondary | ICD-10-CM

## 2021-11-04 DIAGNOSIS — Z1152 Encounter for screening for COVID-19: Secondary | ICD-10-CM | POA: Insufficient documentation

## 2021-11-04 DIAGNOSIS — T50902D Poisoning by unspecified drugs, medicaments and biological substances, intentional self-harm, subsequent encounter: Secondary | ICD-10-CM | POA: Diagnosis not present

## 2021-11-04 DIAGNOSIS — I10 Essential (primary) hypertension: Secondary | ICD-10-CM | POA: Insufficient documentation

## 2021-11-04 DIAGNOSIS — I2510 Atherosclerotic heart disease of native coronary artery without angina pectoris: Secondary | ICD-10-CM | POA: Insufficient documentation

## 2021-11-04 DIAGNOSIS — E1169 Type 2 diabetes mellitus with other specified complication: Secondary | ICD-10-CM | POA: Insufficient documentation

## 2021-11-04 DIAGNOSIS — G459 Transient cerebral ischemic attack, unspecified: Secondary | ICD-10-CM | POA: Insufficient documentation

## 2021-11-04 DIAGNOSIS — E1140 Type 2 diabetes mellitus with diabetic neuropathy, unspecified: Secondary | ICD-10-CM

## 2021-11-04 DIAGNOSIS — F332 Major depressive disorder, recurrent severe without psychotic features: Secondary | ICD-10-CM | POA: Diagnosis not present

## 2021-11-04 DIAGNOSIS — G8191 Hemiplegia, unspecified affecting right dominant side: Secondary | ICD-10-CM

## 2021-11-04 DIAGNOSIS — T39312A Poisoning by propionic acid derivatives, intentional self-harm, initial encounter: Secondary | ICD-10-CM | POA: Diagnosis not present

## 2021-11-04 LAB — CBC
HCT: 40.7 % (ref 39.0–52.0)
Hemoglobin: 13.3 g/dL (ref 13.0–17.0)
MCH: 26.4 pg (ref 26.0–34.0)
MCHC: 32.7 g/dL (ref 30.0–36.0)
MCV: 80.8 fL (ref 80.0–100.0)
Platelets: 263 10*3/uL (ref 150–400)
RBC: 5.04 MIL/uL (ref 4.22–5.81)
RDW: 14.6 % (ref 11.5–15.5)
WBC: 5.5 10*3/uL (ref 4.0–10.5)
nRBC: 0 % (ref 0.0–0.2)

## 2021-11-04 NOTE — ED Triage Notes (Signed)
Arrived via EMS from [place]. Patient reports severe depression and reports he took a handful, which he estimates 75 tablets of 200 mg ibuprofen at 2100. Patient calm, cooperative, AOX4, resp even, unlabored on RA. Denies abdominal pain, nausea vomiting or diarrhea.

## 2021-11-04 NOTE — ED Provider Notes (Signed)
   [facility] Provider Note    Event Date/Time   First MD Initiated Contact with Patient 11/04/21 2320     (approximate)  History   Chief Complaint: Suicidal and Ingestion  HPI  Steve Ramirez is a 86 y.o. male with a past medical history of CAD, hypertension, CVA, diabetes, presents to the emergency department for an intentional overdose.  According to the patient for the past 3 months he has been "wearing down" admits to increasing depression and tonight around 9 PM took 75 200 mg tablets of ibuprofen.  Denies any other substances or medications taken.  Patient states he has never done anything like this before but he has had thoughts.  Denies any complaints.  Denies any nausea or vomiting.  No diarrhea.  No abdominal pain.  Physical Exam   Triage Vital Signs: ED Triage Vitals  Enc Vitals Group     BP 11/04/21 2314 (!) 166/69     Pulse Rate 11/04/21 2314 61     Resp 11/04/21 2314 20     Temp 11/04/21 2314 97.8 F (36.6 C)     Temp Source 11/04/21 2314 Oral     SpO2 11/04/21 2314 97 %     Weight 11/04/21 2318 190 lb (86.2 kg)     Height 11/04/21 2318 '5\' 8"'$  (1.727 m)     Head Circumference --      Peak Flow --      Pain Score 11/04/21 2318 0     Pain Loc --      Pain Edu? --      Excl. in [place]? --     Most recent vital signs: Vitals:   11/04/21 2314  BP: (!) 166/69  Pulse: 61  Resp: 20  Temp: 97.8 F (36.6 C)  SpO2: 97%    General: Awake, no distress.  CV:  Good peripheral perfusion.  Regular rate and rhythm  Resp:  Normal effort.  Equal breath sounds bilaterally.  Abd:  No distention.  Soft, nontender.  No rebound or guarding.  ED Results / Procedures / Treatments   EKG  EKG viewed and interpreted by myself shows a normal sinus rhythm at 65 bpm with a widened QRS, left axis deviation, prolonged PR interval at 253 and QTc of 500.  No concerning ST changes.  EKG largely unchanged from 12/01/2020 including PR prolongation and widened  QRS.  RADIOLOGY  ***   MEDICATIONS ORDERED IN ED: Medications - No data to display   IMPRESSION / MDM / [place] / ED COURSE  I reviewed the triage vital signs and the nursing notes.  Patient's presentation is most consistent with acute presentation with potential threat to life or bodily function.  Patient presents emergency department for an intentional overdose.  At 9 PM patient took 75 to 100 mg tablets of ibuprofen.  Patient has no complaints at this time.  We will place patient under an involuntary commitment order given the intentional overdose.  We will check labs including acetaminophen salicylate and ethanol levels.  We will discuss with poison control obtain an EKG and continue to closely monitor.  We will IV hydrate while awaiting further recommendations from poison control.  Currently patient appears well reassuring vital signs  FINAL CLINICAL IMPRESSION(S) / ED DIAGNOSES   Intentional overdose  Rx / DC Orders   ***  Note:  This document was prepared using Dragon voice recognition software and may include unintentional dictation errors.

## 2021-11-04 NOTE — ED Notes (Signed)
IVC/Consult ordered/pending

## 2021-11-05 DIAGNOSIS — F332 Major depressive disorder, recurrent severe without psychotic features: Secondary | ICD-10-CM | POA: Diagnosis not present

## 2021-11-05 DIAGNOSIS — T50902A Poisoning by unspecified drugs, medicaments and biological substances, intentional self-harm, initial encounter: Secondary | ICD-10-CM

## 2021-11-05 DIAGNOSIS — T50902D Poisoning by unspecified drugs, medicaments and biological substances, intentional self-harm, subsequent encounter: Secondary | ICD-10-CM | POA: Diagnosis not present

## 2021-11-05 DIAGNOSIS — E1140 Type 2 diabetes mellitus with diabetic neuropathy, unspecified: Secondary | ICD-10-CM

## 2021-11-05 HISTORY — DX: Poisoning by unspecified drugs, medicaments and biological substances, intentional self-harm, initial encounter: T50.902A

## 2021-11-05 LAB — COMPREHENSIVE METABOLIC PANEL
ALT: 25 U/L (ref 0–44)
ALT: 21 U/L (ref 0–44)
AST: 21 U/L (ref 15–41)
AST: 20 U/L (ref 15–41)
Albumin: 4.5 g/dL (ref 3.5–5.0)
Albumin: 4 g/dL (ref 3.5–5.0)
Alkaline Phosphatase: 60 U/L (ref 38–126)
Alkaline Phosphatase: 50 U/L (ref 38–126)
Anion gap: 10 (ref 5–15)
Anion gap: 7 (ref 5–15)
BUN: 19 mg/dL (ref 8–23)
BUN: 19 mg/dL (ref 8–23)
CO2: 22 mmol/L (ref 22–32)
CO2: 23 mmol/L (ref 22–32)
Calcium: 9.5 mg/dL (ref 8.9–10.3)
Calcium: 9.1 mg/dL (ref 8.9–10.3)
Chloride: 106 mmol/L (ref 98–111)
Chloride: 108 mmol/L (ref 98–111)
Creatinine, Ser: 1.22 mg/dL (ref 0.61–1.24)
Creatinine, Ser: 1.01 mg/dL (ref 0.61–1.24)
GFR, Estimated: 58 mL/min — ABNORMAL LOW (ref >60)
GFR, Estimated: >60 mL/min (ref >60)
Glucose, Bld: 126 mg/dL — ABNORMAL HIGH (ref 70–99)
Glucose, Bld: 134 mg/dL — ABNORMAL HIGH (ref 70–99)
Potassium: 4.3 mmol/L (ref 3.5–5.1)
Potassium: 4 mmol/L (ref 3.5–5.1)
Sodium: 138 mmol/L (ref 135–145)
Sodium: 138 mmol/L (ref 135–145)
Total Bilirubin: 0.3 mg/dL (ref 0.3–1.2)
Total Bilirubin: 0.5 mg/dL (ref 0.3–1.2)
Total Protein: 7.6 g/dL (ref 6.5–8.1)
Total Protein: 6.8 g/dL (ref 6.5–8.1)

## 2021-11-05 LAB — SALICYLATE LEVEL: Salicylate Lvl: <7 mg/dL — ABNORMAL LOW (ref 7.0–30.0)

## 2021-11-05 LAB — URINE DRUG SCREEN, QUALITATIVE (ARMC ONLY)
Amphetamines, Ur Screen: NOT DETECTED
Barbiturates, Ur Screen: NOT DETECTED
Benzodiazepine, Ur Scrn: NOT DETECTED
Cannabinoid 50 Ng, Ur ~~LOC~~: NOT DETECTED
Cocaine Metabolite,Ur ~~LOC~~: NOT DETECTED
MDMA (Ecstasy)Ur Screen: NOT DETECTED
Methadone Scn, Ur: NOT DETECTED
Opiate, Ur Screen: NOT DETECTED
Phencyclidine (PCP) Ur S: NOT DETECTED
Tricyclic, Ur Screen: NOT DETECTED

## 2021-11-05 LAB — ETHANOL: Alcohol, Ethyl (B): <10 mg/dL (ref <10)

## 2021-11-05 LAB — ACETAMINOPHEN LEVEL
Acetaminophen (Tylenol), Serum: <10 ug/mL — ABNORMAL LOW (ref 10–30)
Acetaminophen (Tylenol), Serum: <10 ug/mL — ABNORMAL LOW (ref 10–30)

## 2021-11-05 LAB — RESP PANEL BY RT-PCR (FLU A&B, COVID) ARPGX2
Influenza A by PCR: NEGATIVE
Influenza B by PCR: NEGATIVE
SARS Coronavirus 2 by RT PCR: NEGATIVE

## 2021-11-05 MED ORDER — SPIRONOLACTONE 25 MG PO TABS
25.0000 mg | ORAL_TABLET | Freq: Every day | ORAL | Status: DC
  Administered 2021-11-05 – 2021-11-06 (×2): 25 mg via ORAL
  Filled 2021-11-05 (×2): qty 1

## 2021-11-05 MED ORDER — LEVOTHYROXINE SODIUM 50 MCG PO TABS
50.0000 ug | ORAL_TABLET | Freq: Every day | ORAL | Status: DC
  Administered 2021-11-05 – 2021-11-06 (×2): 50 ug via ORAL
  Filled 2021-11-05 (×2): qty 1

## 2021-11-05 MED ORDER — PRAVASTATIN SODIUM 20 MG PO TABS
20.0000 mg | ORAL_TABLET | Freq: Every day | ORAL | Status: DC
  Administered 2021-11-05: 20 mg via ORAL
  Filled 2021-11-05: qty 1

## 2021-11-05 MED ORDER — METOPROLOL SUCCINATE ER 50 MG PO TB24
25.0000 mg | ORAL_TABLET | Freq: Every evening | ORAL | Status: DC
  Administered 2021-11-05: 25 mg via ORAL
  Filled 2021-11-05: qty 1

## 2021-11-05 MED ORDER — ASPIRIN 81 MG PO TBEC
81.0000 mg | DELAYED_RELEASE_TABLET | Freq: Two times a day (BID) | ORAL | Status: DC
  Administered 2021-11-05 – 2021-11-06 (×3): 81 mg via ORAL
  Filled 2021-11-05 (×3): qty 1

## 2021-11-05 MED ORDER — AMLODIPINE BESYLATE 5 MG PO TABS
10.0000 mg | ORAL_TABLET | Freq: Every day | ORAL | Status: DC
  Administered 2021-11-05 – 2021-11-06 (×2): 10 mg via ORAL
  Filled 2021-11-05 (×2): qty 2

## 2021-11-05 MED ORDER — ROPINIROLE HCL 1 MG PO TABS
1.0000 mg | ORAL_TABLET | Freq: Every day | ORAL | Status: DC
  Administered 2021-11-05: 1 mg via ORAL
  Filled 2021-11-05 (×2): qty 1

## 2021-11-05 MED ORDER — CLONIDINE HCL 0.3 MG/24HR TD PTWK: 0.3000 mg | MEDICATED_PATCH | TRANSDERMAL | Status: DC

## 2021-11-05 MED ORDER — CLOPIDOGREL BISULFATE 75 MG PO TABS
75.0000 mg | ORAL_TABLET | Freq: Every day | ORAL | Status: DC
  Administered 2021-11-05 – 2021-11-06 (×2): 75 mg via ORAL
  Filled 2021-11-05 (×2): qty 1

## 2021-11-05 MED ORDER — ISOSORBIDE MONONITRATE ER 60 MG PO TB24
30.0000 mg | ORAL_TABLET | Freq: Every evening | ORAL | Status: DC
  Administered 2021-11-05: 30 mg via ORAL
  Filled 2021-11-05: qty 1

## 2021-11-05 MED ORDER — METFORMIN HCL 500 MG PO TABS
1000.0000 mg | ORAL_TABLET | Freq: Two times a day (BID) | ORAL | Status: DC
  Administered 2021-11-05 – 2021-11-06 (×3): 1000 mg via ORAL
  Filled 2021-11-05 (×3): qty 2

## 2021-11-05 MED ORDER — CYANOCOBALAMIN 500 MCG PO TABS
1000.0000 ug | ORAL_TABLET | Freq: Every day | ORAL | Status: DC
  Administered 2021-11-05 – 2021-11-06 (×2): 1000 ug via ORAL
  Filled 2021-11-05 (×2): qty 2

## 2021-11-05 MED ORDER — FERROUS SULFATE 325 (65 FE) MG PO TABS
325.0000 mg | ORAL_TABLET | Freq: Every day | ORAL | Status: DC
  Administered 2021-11-06: 325 mg via ORAL
  Filled 2021-11-05: qty 1

## 2021-11-05 MED ORDER — LISINOPRIL 10 MG PO TABS
40.0000 mg | ORAL_TABLET | Freq: Every day | ORAL | Status: DC
  Administered 2021-11-05 – 2021-11-06 (×2): 40 mg via ORAL
  Filled 2021-11-05 (×2): qty 4

## 2021-11-05 MED ORDER — ADULT MULTIVITAMIN W/MINERALS CH
1.0000 | ORAL_TABLET | Freq: Every day | ORAL | Status: DC
  Administered 2021-11-06: 1 via ORAL
  Filled 2021-11-05: qty 1

## 2021-11-05 MED ORDER — ESCITALOPRAM OXALATE 10 MG PO TABS
20.0000 mg | ORAL_TABLET | Freq: Every day | ORAL | Status: DC
  Administered 2021-11-05 – 2021-11-06 (×2): 20 mg via ORAL
  Filled 2021-11-05 (×2): qty 2

## 2021-11-05 NOTE — ED Notes (Signed)
IVC/Consult completed/ [place]

## 2021-11-05 NOTE — ED Notes (Signed)
Pt asked for new brief, changed himself

## 2021-11-05 NOTE — ED Notes (Signed)
Pt given breakfast tray

## 2021-11-05 NOTE — Consult Note (Signed)
[facility] ED Progress Note  11/05/2021 6:41 PM Steve Ramirez  MRN:  [id number]   Method of visit?: Face to Face   Subjective: "I am not depressed; I just feel done."   On reassessment this afternoon, the patient is talkative, contemplating past experiences that he had with his wife and that they had a wonderful life, over 58 years together, and life is just so boring now.  He states, there is still a little bit we can do."  He states that he is just tired of constantly reminding his wife of just little things that she needs to do.  He has to constantly repeat himself.  Patient expresses hopelessness.  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  He denies active suicidal thoughts at this time, but a conversation expresses passive suicidal ideation.  Discussion with patient regarding how inpatient psychiatric hospitalization may help to improve his mood.  Also discussed making a schedule with his family so that he can get some breaks from taking care of his wife during the day.  Patient states that he does have good family support.  Inpatient psychiatric hospitalization continues to be warranted.   Principal Problem: Intentional overdose ([place]) Diagnosis:  Principal Problem:   Intentional overdose ([place]) Active Problems:   Depression   History of TIAs   Elevated transaminase level   Acute ischemic stroke (HCC)   Essential hypertension   CAD (coronary artery disease)   Altered mental status   Type 2 diabetes mellitus with diabetic neuropathy, without long-term current use of insulin (HCC)  Total Time spent with patient: 30 minutes  Past Psychiatric History:   Past Medical History:  Past Medical History:  Diagnosis Date   Coronary atherosclerosis of native coronary artery    Essential hypertension, benign    GIB (gastrointestinal bleeding)    Colonic diverticular bleed   History of melanoma     History of TIAs    Stroke ([place])    Type 2 diabetes mellitus ([place])     Past Surgical History:  Procedure Laterality Date   COLONOSCOPY  08/13/2010   Procedure: COLONOSCOPY;  Surgeon: Rogene [place], MD;  Location: [facility];  Service: Endoscopy;  Laterality: N/A;   JOINT REPLACEMENT     KNEE SURGERY     arthroscopic   LEFT HEART CATHETERIZATION WITH CORONARY ANGIOGRAM N/A 11/24/2010   Procedure: LEFT HEART CATHETERIZATION WITH CORONARY ANGIOGRAM;  Surgeon: Josue Hector, MD;  Location: [facility] CATH LAB;  Service: Cardiovascular;  Laterality: N/A;   neck fusion     years ago   TOTAL HIP ARTHROPLASTY Right 08/02/2018   Procedure: TOTAL HIP ARTHROPLASTY ANTERIOR APPROACH;  Surgeon: Renette Butters, MD;  Location: [facility] ORS;  Service: Orthopedics;  Laterality: Right;   TOTAL HIP ARTHROPLASTY Left 10/18/2018   Procedure: TOTAL HIP ARTHROPLASTY ANTERIOR APPROACH;  Surgeon: Renette Butters, MD;  Location: [facility] ORS;  Service: Orthopedics;  Laterality: Left;   Family History:  Family History  Problem Relation Age of Onset   Coronary artery disease Father    Asthma Other    Emphysema Mother    Coronary artery disease Paternal Uncle    Brain cancer Brother    Family Psychiatric  History:  Social History:  Social History   Substance and Sexual Activity  Alcohol Use Yes   Comment: occasionally     Social History   Substance and Sexual  Activity  Drug Use No    Social History   Socioeconomic History   Marital status: Married    Spouse name: Not on file   Number of children: Not on file   Years of education: Not on file   Highest education level: Not on file  Occupational History   Occupation: retired  Tobacco Use   Smoking status: Former    Types: Cigarettes    Quit date: 01/13/1980    Years since quitting: 41.8   Smokeless tobacco: Never  Vaping Use   Vaping Use: Never used  Substance and Sexual Activity   Alcohol use: Yes    Comment: occasionally   Drug use: No   Sexual  activity: Not Currently    Partners: Female  Other Topics Concern   Not on file  Social History Narrative   Lives with wife. Grew up in [place] and after college moved to [place]. Got married. Moved to [place] and then to [place], [place].    Attends [employer].    Masters health education, administration   3 children   Caffeine- 1 pot coffee daily.   Daughter is a Education officer, museum. Son is a Higher education careers adviser. Son is in [employer] and Recreation.       Social Determinants of Health   Financial Resource Strain: Not on file  Food Insecurity: Not on file  Transportation Needs: Not on file  Physical Activity: Not on file  Stress: Not on file  Social Connections: Not on file    Sleep: Fair  Appetite:  Fair  Current Medications: Current Facility-Administered Medications  Medication Dose Route Frequency Provider Last Rate Last Admin   amLODipine (NORVASC) tablet 10 mg  10 mg Oral Daily Merlyn Lot, MD   10 mg at 11/05/21 1348   aspirin EC tablet 81 mg  81 mg Oral BID Merlyn Lot, MD   81 mg at 11/05/21 1348   [START ON 11/07/2021] cloNIDine (CATAPRES - Dosed in mg/24 hr) patch 0.3 mg  0.3 mg Transdermal Q Norton Blizzard, MD       clopidogrel (PLAVIX) tablet 75 mg  75 mg Oral Daily Merlyn Lot, MD   75 mg at 11/05/21 1348   cyanocobalamin (VITAMIN B12) tablet 1,000 mcg  1,000 mcg Oral Daily Merlyn Lot, MD   1,000 mcg at 11/05/21 1348   escitalopram (LEXAPRO) tablet 20 mg  20 mg Oral Daily Waldon Merl F, NP   20 mg at 11/05/21 1348   [START ON 11/06/2021] ferrous sulfate tablet 325 mg  325 mg Oral Q breakfast Merlyn Lot, MD       isosorbide mononitrate (IMDUR) 24 hr tablet 30 mg  30 mg Oral QPM Merlyn Lot, MD   30 mg at 11/05/21 1810   levothyroxine (SYNTHROID) tablet 50 mcg  50 mcg Oral Daily Merlyn Lot, MD   50 mcg at 11/05/21 1348   lisinopril (ZESTRIL) tablet 40 mg  40 mg Oral Daily Merlyn Lot, MD   40 mg at 11/05/21  1347   metFORMIN (GLUCOPHAGE) tablet 1,000 mg  1,000 mg Oral BID Merlyn Lot, MD   1,000 mg at 11/05/21 1347   metoprolol succinate (TOPROL-XL) 24 hr tablet 25 mg  25 mg Oral QPM Merlyn Lot, MD   25 mg at 11/05/21 1809   multivitamin with minerals tablet 1 tablet  1 tablet Oral Daily Merlyn Lot, MD       pravastatin (PRAVACHOL) tablet 20 mg  20 mg Oral QHS Merlyn Lot, MD  rOPINIRole (REQUIP) tablet 1 mg  1 mg Oral QHS Merlyn Lot, MD       spironolactone (ALDACTONE) tablet 25 mg  25 mg Oral Daily Merlyn Lot, MD   25 mg at 11/05/21 1348   Current Outpatient Medications  Medication Sig Dispense Refill   amLODipine (NORVASC) 10 MG tablet Take 1 tablet (10 mg total) by mouth daily.     aspirin EC 81 MG tablet Take 1 tablet (81 mg total) by mouth 2 (two) times daily. For DVT prophylaxis for 30 days after surgery. 60 tablet 0   betamethasone dipropionate 0.05 % cream Apply 1 Application topically 2 (two) times daily as needed.     clopidogrel (PLAVIX) 75 MG tablet Take 1 tablet (75 mg total) by mouth daily. 90 tablet 0   escitalopram (LEXAPRO) 20 MG tablet Take 20 mg by mouth daily.     ferrous sulfate 325 (65 FE) MG tablet Take 325 mg by mouth daily with breakfast.     isosorbide mononitrate (IMDUR) 30 MG 24 hr tablet Take 30 mg by mouth every evening.      levothyroxine (SYNTHROID, LEVOTHROID) 50 MCG tablet Take 1 tablet (50 mcg total) by mouth daily. 90 tablet 0   lisinopril (PRINIVIL,ZESTRIL) 40 MG tablet Take 1 tablet (40 mg total) by mouth daily. 90 tablet 3   metFORMIN (GLUCOPHAGE) 1000 MG tablet Take 1,000 mg by mouth 2 (two) times daily.     metoprolol succinate (TOPROL-XL) 25 MG 24 hr tablet Take 25 mg by mouth every evening.      Multiple Vitamin (MULTIVITAMIN) tablet Take 1 tablet by mouth daily.       Omega-3 Fatty Acids (FISH OIL) 1000 MG CAPS Take 1,000 mg by mouth at bedtime.     pravastatin (PRAVACHOL) 20 MG tablet Take 20 mg by mouth at  bedtime.     spironolactone (ALDACTONE) 25 MG tablet Take 1 tablet (25 mg total) by mouth daily. 90 tablet 0   vitamin B-12 (CYANOCOBALAMIN) 1000 MCG tablet Take 1 tablet (1,000 mcg total) by mouth daily. 30 tablet 0   acetaminophen (TYLENOL) 325 MG tablet Take 2 tablets (650 mg total) by mouth every 4 (four) hours as needed for mild pain (or temp > 37.5 C (99.5 F)).     cetirizine (ZYRTEC) 10 MG tablet Take 10 mg by mouth daily.     cloNIDine (CATAPRES - DOSED IN MG/24 HR) 0.3 mg/24hr patch Place 1 patch (0.3 mg total) onto the skin once a week. (Patient taking differently: Place 0.3 mg onto the skin every Friday.) 4 patch 12   ipratropium-albuterol (DUONEB) 0.5-2.5 (3) MG/3ML SOLN Take 3 mLs by nebulization every 4 (four) hours as needed. 360 mL    nitroGLYCERIN (NITROSTAT) 0.4 MG SL tablet Place 0.4 mg under the tongue every 5 (five) minutes as needed for chest pain.     rOPINIRole (REQUIP) 0.5 MG tablet Take 2 tablets (1 mg total) by mouth at bedtime. 60 tablet 0    Lab Results:  Results for orders placed or performed during the hospital encounter of 11/04/21 (from the past 48 hour(s))  CBC     Status: None   Collection Time: 11/04/21 11:24 PM  Result Value Ref Range   WBC 5.5 4.0 - 10.5 K/uL   RBC 5.04 4.22 - 5.81 MIL/uL   Hemoglobin 13.3 13.0 - 17.0 g/dL   HCT 40.7 39.0 - 52.0 %   MCV 80.8 80.0 - 100.0 fL   MCH 26.4 26.0 - 34.0  pg   MCHC 32.7 30.0 - 36.0 g/dL   RDW 14.6 11.5 - 15.5 %   Platelets 263 150 - 400 K/uL   nRBC 0.0 0.0 - 0.2 %    Comment: Performed at [facility], [address] [place], [place] [address]  Comprehensive metabolic panel     Status: Abnormal   Collection Time: 11/04/21 11:24 PM  Result Value Ref Range   Sodium 138 135 - 145 mmol/L   Potassium 4.3 3.5 - 5.1 mmol/L   Chloride 106 98 - 111 mmol/L   CO2 22 22 - 32 mmol/L   Glucose, Bld 126 (H) 70 - 99 mg/dL    Comment: Glucose reference range applies only to samples taken after fasting for at  least 8 hours.   BUN 19 8 - 23 mg/dL   Creatinine, Ser 1.22 0.61 - 1.24 mg/dL   Calcium 9.5 8.9 - 10.3 mg/dL   Total Protein 7.6 6.5 - 8.1 g/dL   Albumin 4.5 3.5 - 5.0 g/dL   AST 21 15 - 41 U/L   ALT 25 0 - 44 U/L   Alkaline Phosphatase 60 38 - 126 U/L   Total Bilirubin 0.3 0.3 - 1.2 mg/dL   GFR, Estimated 58 (L) >60 mL/min    Comment: (NOTE) Calculated using the CKD-EPI Creatinine Equation (2021)    Anion gap 10 5 - 15    Comment: Performed at [facility], [address] [place], [place] [address]  Ethanol     Status: None   Collection Time: 11/04/21 11:24 PM  Result Value Ref Range   Alcohol, Ethyl (B) <10 <10 mg/dL    Comment: (NOTE) Lowest detectable limit for serum alcohol is 10 mg/dL.  For medical purposes only. Performed at [facility], [address] [place], [place] [address]   Acetaminophen level     Status: Abnormal   Collection Time: 11/04/21 11:24 PM  Result Value Ref Range   Acetaminophen (Tylenol), Serum <10 (L) 10 - 30 ug/mL    Comment: (NOTE) Therapeutic concentrations vary significantly. A range of 10-30 ug/mL  may be an effective concentration for many patients. However, some  are best treated at concentrations outside of this range. Acetaminophen concentrations >150 ug/mL at 4 hours after ingestion  and >50 ug/mL at 12 hours after ingestion are often associated with  toxic reactions.  Performed at [facility], [address] [place], [place] [address]   Salicylate level     Status: Abnormal   Collection Time: 11/04/21 11:24 PM  Result Value Ref Range   Salicylate Lvl <8.7 (L) 7.0 - 30.0 mg/dL    Comment: Performed at [facility], [address] [place], [place] [address]  Resp Panel by RT-PCR (Flu A&B, Covid) Anterior Nasal Swab     Status: None   Collection Time: 11/04/21 11:29 PM   Specimen: Anterior Nasal Swab  Result Value Ref Range   SARS Coronavirus 2 by RT PCR NEGATIVE NEGATIVE    Comment:  (NOTE) SARS-CoV-2 target nucleic acids are NOT DETECTED.  The SARS-CoV-2 RNA is generally detectable in upper respiratory specimens during the acute phase of infection. The lowest concentration of SARS-CoV-2 viral copies this assay can detect is 138 copies/mL. A negative result does not preclude SARS-Cov-2 infection and should not be used as the sole basis for treatment or other patient management decisions. A negative result may occur with  improper specimen collection/handling, submission of specimen other than nasopharyngeal swab, presence of viral  mutation(s) within the areas targeted by this assay, and inadequate number of viral copies(<138 copies/mL). A negative result must be combined with clinical observations, patient history, and epidemiological information. The expected result is Negative.  Fact Sheet for Patients:  EntrepreneurPulse.com.au  Fact Sheet for Healthcare Providers:  IncredibleEmployment.be  This test is no t yet approved or cleared by the [place] FDA and  has been authorized for detection and/or diagnosis of SARS-CoV-2 by FDA under an Emergency Use Authorization (EUA). This EUA will remain  in effect (meaning this test can be used) for the duration of the COVID-19 declaration under Section 564(b)(1) of the Act, 21 U.S.C.section 360bbb-3(b)(1), unless the authorization is terminated  or revoked sooner.       Influenza A by PCR NEGATIVE NEGATIVE   Influenza B by PCR NEGATIVE NEGATIVE    Comment: (NOTE) The Xpert Xpress SARS-CoV-2/FLU/RSV plus assay is intended as an aid in the diagnosis of influenza from Nasopharyngeal swab specimens and should not be used as a sole basis for treatment. Nasal washings and aspirates are unacceptable for Xpert Xpress SARS-CoV-2/FLU/RSV testing.  Fact Sheet for Patients: EntrepreneurPulse.com.au  Fact Sheet for Healthcare  Providers: IncredibleEmployment.be  This test is not yet approved or cleared by the [place] FDA and has been authorized for detection and/or diagnosis of SARS-CoV-2 by FDA under an Emergency Use Authorization (EUA). This EUA will remain in effect (meaning this test can be used) for the duration of the COVID-19 declaration under Section 564(b)(1) of the Act, 21 U.S.C. section 360bbb-3(b)(1), unless the authorization is terminated or revoked.  Performed at [facility], [address], [place], [place] [address]   Urine Drug Screen, Qualitative [facility] only)     Status: None   Collection Time: 11/04/21 11:29 PM  Result Value Ref Range   Tricyclic, Ur Screen NONE DETECTED NONE DETECTED   Amphetamines, Ur Screen NONE DETECTED NONE DETECTED   MDMA (Ecstasy)Ur Screen NONE DETECTED NONE DETECTED   Cocaine Metabolite,Ur [place] NONE DETECTED NONE DETECTED   Opiate, Ur Screen NONE DETECTED NONE DETECTED   Phencyclidine (PCP) Ur S NONE DETECTED NONE DETECTED   Cannabinoid 50 Ng, Ur [place] NONE DETECTED NONE DETECTED   Barbiturates, Ur Screen NONE DETECTED NONE DETECTED   Benzodiazepine, Ur Scrn NONE DETECTED NONE DETECTED   Methadone Scn, Ur NONE DETECTED NONE DETECTED    Comment: (NOTE) Tricyclics + metabolites, urine    Cutoff 1000 ng/mL Amphetamines + metabolites, urine  Cutoff 1000 ng/mL MDMA (Ecstasy), urine              Cutoff 500 ng/mL Cocaine Metabolite, urine          Cutoff 300 ng/mL Opiate + metabolites, urine        Cutoff 300 ng/mL Phencyclidine (PCP), urine         Cutoff 25 ng/mL Cannabinoid, urine                 Cutoff 50 ng/mL Barbiturates + metabolites, urine  Cutoff 200 ng/mL Benzodiazepine, urine              Cutoff 200 ng/mL Methadone, urine                   Cutoff 300 ng/mL  The urine drug screen provides only a preliminary, unconfirmed analytical test result and should not be used for non-medical purposes. Clinical consideration and  professional judgment should be applied to any positive drug screen result due to possible interfering substances. A more  specific alternate chemical method must be used in order to obtain a confirmed analytical result. Gas chromatography / mass spectrometry (GC/MS) is the preferred confirm atory method. Performed at [facility], [address] [place], [place] [address]   Comprehensive metabolic panel     Status: Abnormal   Collection Time: 11/05/21  3:03 AM  Result Value Ref Range   Sodium 138 135 - 145 mmol/L   Potassium 4.0 3.5 - 5.1 mmol/L   Chloride 108 98 - 111 mmol/L   CO2 23 22 - 32 mmol/L   Glucose, Bld 134 (H) 70 - 99 mg/dL    Comment: Glucose reference range applies only to samples taken after fasting for at least 8 hours.   BUN 19 8 - 23 mg/dL   Creatinine, Ser 1.01 0.61 - 1.24 mg/dL   Calcium 9.1 8.9 - 10.3 mg/dL   Total Protein 6.8 6.5 - 8.1 g/dL   Albumin 4.0 3.5 - 5.0 g/dL   AST 20 15 - 41 U/L   ALT 21 0 - 44 U/L   Alkaline Phosphatase 50 38 - 126 U/L   Total Bilirubin 0.5 0.3 - 1.2 mg/dL   GFR, Estimated >60 >60 mL/min    Comment: (NOTE) Calculated using the CKD-EPI Creatinine Equation (2021)    Anion gap 7 5 - 15    Comment: Performed at [facility], [address] [place], [place] [address]  Acetaminophen level     Status: Abnormal   Collection Time: 11/05/21  3:03 AM  Result Value Ref Range   Acetaminophen (Tylenol), Serum <10 (L) 10 - 30 ug/mL    Comment: (NOTE) Therapeutic concentrations vary significantly. A range of 10-30 ug/mL  may be an effective concentration for many patients. However, some  are best treated at concentrations outside of this range. Acetaminophen concentrations >150 ug/mL at 4 hours after ingestion  and >50 ug/mL at 12 hours after ingestion are often associated with  toxic reactions.  Performed at [facility], [address] [place], [place] [address]     Blood Alcohol level:  Lab  Results  Component Value Date   [facility] <10 11/04/2021   ETH <10 11/08/2020    Physical Findings: AIMS:  , ,  ,  ,    CIWA:    COWS:     Musculoskeletal: Strength & Muscle Tone:  Did not assess Gait & Station:  Did not observe Patient leans: N/A  Psychiatric Specialty Exam:  Presentation  General Appearance:  Appropriate for Environment  Eye Contact: Fair  Speech: Clear and Coherent  Speech Volume: Normal  Handedness: Right   Mood and Affect  Mood: Depressed; Hopeless  Affect: Blunt; Congruent   Thought Process  Thought Processes: Coherent  Descriptions of Associations:Intact  Orientation:Full (Time, Place and Person)  Thought Content:WDL; Rumination  History of Schizophrenia/Schizoaffective disorder:No data recorded Duration of Psychotic Symptoms:No data recorded Hallucinations:Hallucinations: None  Ideas of Reference:None  Suicidal Thoughts:Suicidal Thoughts: Yes, Passive SI Active Intent and/or Plan: With Intent; With Plan; With Means to Carry Out; With Access to Means  Homicidal Thoughts:Homicidal Thoughts: No   Sensorium  Memory: Immediate Good; Recent Good  Judgment: Poor  Insight: Fair   Community education officer  Concentration: Good  Attention Span: Good  Recall: Good  Fund of Knowledge: Good  Language: Fair   Psychomotor Activity  Psychomotor Activity: Psychomotor Activity: Normal   Assets  Assets: Communication Skills; Desire for Improvement; Financial Resources/Insurance; Housing; Social Support; Resilience   Sleep  Sleep: Sleep:  Fair    Physical Exam: Physical Exam Vitals and nursing note reviewed.  HENT:     Head: Normocephalic.     Nose: No congestion or rhinorrhea.  Eyes:     General:        Right eye: No discharge.        Left eye: No discharge.  Cardiovascular:     Rate and Rhythm: Normal rate.  Pulmonary:     Effort: Pulmonary effort is normal.  Musculoskeletal:        General: Normal  range of motion.     Cervical back: Normal range of motion.  Skin:    General: Skin is dry.  Neurological:     Mental Status: He is alert and oriented to person, place, and time.  Psychiatric:        Attention and Perception: Attention normal.        Mood and Affect: Mood is depressed. Affect is blunt.        Speech: Speech normal.        Behavior: Behavior is cooperative.        Thought Content: Thought content includes suicidal (passive) ideation.        Cognition and Memory: Cognition normal.        Judgment: Judgment is impulsive.    Review of Systems  Constitutional: Negative.   HENT: Negative.    Respiratory: Negative.    Skin: Negative.   Psychiatric/Behavioral:  Positive for depression and suicidal ideas. Negative for hallucinations, memory loss and substance abuse. The patient is not nervous/anxious and does not have insomnia.    Blood pressure (!) 148/62, pulse 76, temperature 97.7 F (36.5 C), temperature source Oral, resp. rate 17, height '5\' 8"'$  (1.727 m), weight 86.2 kg, SpO2 97 %. Body mass index is 28.89 kg/m.  Treatment Plan Summary: Daily contact with patient to assess and evaluate symptoms and progress in treatment and Plan admit to inpatient psychiatry .  No appropriate bed at this time.  Hopefully we will admit to our geriatric psychiatry unit tomorrow, 10/26. Restarted PTA medication for depression, Lexapro 20 mg daily.   Sherlon Handing, NP 11/05/2021, 6:41 PM

## 2021-11-05 NOTE — ED Notes (Signed)
Hospital meal provided, pt tolerated w/o complaints.  Waste discarded appropriately.  

## 2021-11-05 NOTE — ED Notes (Signed)
Patient resting in bed with eyes open. Resp even, unlabored on RA. Calm and cooperative. No distress noted at this time.

## 2021-11-05 NOTE — ED Notes (Signed)
IVC pending placement 

## 2021-11-05 NOTE — ED Notes (Signed)
Pt brought to room 20, pt is dressed in hospital gown. Clothing placed in belongings bag by RN station.

## 2021-11-05 NOTE — ED Notes (Signed)
Patient is pleasant, no signs of distress, will continue to monitor, He is safe, rounds made q 15 minute for safety.

## 2021-11-05 NOTE — ED Notes (Signed)
Pt visitor leaving, given copy of visitor policy. Visitor able to give a copy of POA and medication record to this RN. Copy made and placed in chart.

## 2021-11-05 NOTE — ED Notes (Signed)
Poison  control called to get the last set of v/s, and to find out treatment plan, Let her know that patient would be admitted to geriatric Psych.

## 2021-11-05 NOTE — Consult Note (Signed)
[facility] Face-to-Face Psychiatry Consult   Reason for Consult: Suicidal and Ingestion Referring Physician: Dr. Kerman Passey Patient Identification: Steve Ramirez MRN:  [id number] Principal Diagnosis: <principal problem not specified> Diagnosis:  Active Problems:   History of TIAs   Elevated transaminase level   Acute ischemic stroke [facility])   Essential hypertension   Depression   CAD (coronary artery disease)   Altered mental status   Type 2 diabetes mellitus with diabetic neuropathy, without long-term current use of insulin ([place])   Total Time spent with patient: 1 hour  Subjective: "Dementia is a terrible disease. My wife has dementia, and I am tired."  Steve Ramirez is a 86 y.o. male patient presented to [facility] via EMS from [place] facility under involuntary commitment status (IVC). The patient reports severe depression and reports he took a handful of medication, which he estimates 75 tablets of 200 mg ibuprofen at 2100. The patient shared that he lives at the facility with his wife, who has dementia. He shared that the disease has worsened in the past six months. He and his wife have been together for 56 years, and his wife's illness has taken a toll on him. He shared that even though he and his wife reside in a nursing facility, his wife refused to let the nursing staff assist her. He said his wife does not listen to him when he tries to help her. The patient shared that he feels overwhelmed and exhausted. The patient stated, "I should know better. 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 I have had 16 strokes." The patient shared he became depressed, and he was not thinking, and intentionally ingested 75 ibuprofen tablets.  This provider saw The patient face-to-face; the chart was reviewed, and consulted with Dr. Kerman Passey on 11/05/2021 due to the patient's care. It was discussed with  the EDP that the patient does meet the criteria to be admitted to the geriatric-psychiatric inpatient unit. On evaluation, the patient is alert and oriented x 4, sad but calm, cooperative, and mood-congruent with affect. The patient does not appear to be responding to internal or external stimuli. Neither is the patient presenting with any delusional thinking. The patient denies auditory or visual hallucinations. The patient denies any suicidal, homicidal, or self-harm ideations. The patient is not presenting with any psychotic or paranoid behaviors. During an encounter with the patient, he could answer questions appropriately.  HPI:    Past Psychiatric History: History reviewed. No pertinent past psychiatric history  Risk to Self:   Risk to Others:   Prior Inpatient Therapy:   Prior Outpatient Therapy:    Past Medical History:  Past Medical History:  Diagnosis Date   Coronary atherosclerosis of native coronary artery    Essential hypertension, benign    GIB (gastrointestinal bleeding)    Colonic diverticular bleed   History of melanoma    History of TIAs    Stroke (HCC)    Type 2 diabetes mellitus ([place])     Past Surgical History:  Procedure Laterality Date   COLONOSCOPY  08/13/2010   Procedure: COLONOSCOPY;  Surgeon: Rogene Houston, MD;  [facility];  Service: Endoscopy;  Laterality: N/A;   JOINT REPLACEMENT     KNEE SURGERY     arthroscopic   LEFT HEART CATHETERIZATION WITH CORONARY ANGIOGRAM N/A 11/24/2010   Procedure: LEFT HEART CATHETERIZATION WITH CORONARY ANGIOGRAM;  Surgeon: Wallis Bamberg  Johnsie Cancel, MD;  Location: [facility];  Service: Cardiovascular;  Laterality: N/A;   neck fusion     years ago   TOTAL HIP ARTHROPLASTY Right 08/02/2018   Procedure: TOTAL HIP ARTHROPLASTY ANTERIOR APPROACH;  Surgeon: Renette Butters, MD;  Location: [facility];  Service: Orthopedics;  Laterality: Right;   TOTAL HIP ARTHROPLASTY Left 10/18/2018   Procedure: TOTAL HIP ARTHROPLASTY ANTERIOR  APPROACH;  Surgeon: Renette Butters, MD;  Location: [facility];  Service: Orthopedics;  Laterality: Left;   Family History:  Family History  Problem Relation Age of Onset   Coronary artery disease Father    Asthma Other    Emphysema Mother    Coronary artery disease Paternal Uncle    Brain cancer Brother    Family Psychiatric  History: History reviewed. No pertinent family psychiatric history Social History:  Social History   Substance and Sexual Activity  Alcohol Use Yes   Comment: occasionally     Social History   Substance and Sexual Activity  Drug Use No    Social History   Socioeconomic History   Marital status: Married    Spouse name: Not on file   Number of children: Not on file   Years of education: Not on file   Highest education level: Not on file  Occupational History   Occupation: retired  Tobacco Use   Smoking status: Former    Types: Cigarettes    Quit date: 01/13/1980    Years since quitting: 41.8   Smokeless tobacco: Never  Vaping Use   Vaping Use: Never used  Substance and Sexual Activity   Alcohol use: Yes    Comment: occasionally   Drug use: No   Sexual activity: Not Currently    Partners: Female  Other Topics Concern   Not on file  Social History Narrative   Lives with wife. Grew up in [place] and after college moved to [place]. Got married. Moved to [place] and then to [place], [place].    Attends [employer].    Masters health education, administration   3 children   Caffeine- 1 pot coffee daily.   Daughter is a Education officer, museum. Son is a Higher education careers adviser. Son is in [employer].       Social Determinants of Health   Financial Resource Strain: Not on file  Food Insecurity: Not on file  Transportation Needs: Not on file  Physical Activity: Not on file  Stress: Not on file  Social Connections: Not on file   Additional Social History:    Allergies:  No Known Allergies  Labs:  Results for orders placed or performed  during the hospital encounter of 11/04/21 (from the past 48 hour(s))  CBC     Status: None   Collection Time: 11/04/21 11:24 PM  Result Value Ref Range   WBC 5.5 4.0 - 10.5 K/uL   RBC 5.04 4.22 - 5.81 MIL/uL   Hemoglobin 13.3 13.0 - 17.0 g/dL   HCT 40.7 39.0 - 52.0 %   MCV 80.8 80.0 - 100.0 fL   MCH 26.4 26.0 - 34.0 pg   MCHC 32.7 30.0 - 36.0 g/dL   RDW 14.6 11.5 - 15.5 %   Platelets 263 150 - 400 K/uL   nRBC 0.0 0.0 - 0.2 %    Comment: Performed at [facility], [address], [place], [place] [address]  Comprehensive metabolic panel     Status: Abnormal   Collection Time: 11/04/21 11:24 PM  Result Value Ref  Range   Sodium 138 135 - 145 mmol/L   Potassium 4.3 3.5 - 5.1 mmol/L   Chloride 106 98 - 111 mmol/L   CO2 22 22 - 32 mmol/L   Glucose, Bld 126 (H) 70 - 99 mg/dL    Comment: Glucose reference range applies only to samples taken after fasting for at least 8 hours.   BUN 19 8 - 23 mg/dL   Creatinine, Ser 1.22 0.61 - 1.24 mg/dL   Calcium 9.5 8.9 - 10.3 mg/dL   Total Protein 7.6 6.5 - 8.1 g/dL   Albumin 4.5 3.5 - 5.0 g/dL   AST 21 15 - 41 U/L   ALT 25 0 - 44 U/L   Alkaline Phosphatase 60 38 - 126 U/L   Total Bilirubin 0.3 0.3 - 1.2 mg/dL   GFR, Estimated 58 (L) >60 mL/min    Comment: (NOTE) Calculated using the CKD-EPI Creatinine Equation (2021)    Anion gap 10 5 - 15    Comment: Performed at [facility], [address] [place], [place] [address]  Ethanol     Status: None   Collection Time: 11/04/21 11:24 PM  Result Value Ref Range   Alcohol, Ethyl (B) <10 <10 mg/dL    Comment: (NOTE) Lowest detectable limit for serum alcohol is 10 mg/dL.  For medical purposes only. Performed at [facility], [address] [place], [place] [address]   Acetaminophen level     Status: Abnormal   Collection Time: 11/04/21 11:24 PM  Result Value Ref Range   Acetaminophen (Tylenol), Serum <10 (L) 10 - 30 ug/mL    Comment: (NOTE) Therapeutic  concentrations vary significantly. A range of 10-30 ug/mL  may be an effective concentration for many patients. However, some  are best treated at concentrations outside of this range. Acetaminophen concentrations >150 ug/mL at 4 hours after ingestion  and >50 ug/mL at 12 hours after ingestion are often associated with  toxic reactions.  Performed at [facility], [address] [place], [place] [address]   Salicylate level     Status: Abnormal   Collection Time: 11/04/21 11:24 PM  Result Value Ref Range   Salicylate Lvl <7.6 (L) 7.0 - 30.0 mg/dL    Comment: Performed at [facility], [address] [place], [place] [address]  Resp Panel by RT-PCR (Flu A&B, Covid) Anterior Nasal Swab     Status: None   Collection Time: 11/04/21 11:29 PM   Specimen: Anterior Nasal Swab  Result Value Ref Range   SARS Coronavirus 2 by RT PCR NEGATIVE NEGATIVE    Comment: (NOTE) SARS-CoV-2 target nucleic acids are NOT DETECTED.  The SARS-CoV-2 RNA is generally detectable in upper respiratory specimens during the acute phase of infection. The lowest concentration of SARS-CoV-2 viral copies this assay can detect is 138 copies/mL. A negative result does not preclude SARS-Cov-2 infection and should not be used as the sole basis for treatment or other patient management decisions. A negative result may occur with  improper specimen collection/handling, submission of specimen other than nasopharyngeal swab, presence of viral mutation(s) within the areas targeted by this assay, and inadequate number of viral copies(<138 copies/mL). A negative result must be combined with clinical observations, patient history, and epidemiological information. The expected result is Negative.  Fact Sheet for Patients:  EntrepreneurPulse.com.au  Fact Sheet for Healthcare Providers:  IncredibleEmployment.be  This test is no t yet approved or cleared by the [place] and  has been authorized for  detection and/or diagnosis of SARS-CoV-2 by FDA under an Emergency Use Authorization (EUA). This EUA will remain  in effect (meaning this test can be used) for the duration of the COVID-19 declaration under Section 564(b)(1) of the Act, 21 U.S.C.section 360bbb-3(b)(1), unless the authorization is terminated  or revoked sooner.       Influenza A by PCR NEGATIVE NEGATIVE   Influenza B by PCR NEGATIVE NEGATIVE    Comment: (NOTE) The Xpert Xpress SARS-CoV-2/FLU/RSV plus assay is intended as an aid in the diagnosis of influenza from Nasopharyngeal swab specimens and should not be used as a sole basis for treatment. Nasal washings and aspirates are unacceptable for Xpert Xpress SARS-CoV-2/FLU/RSV testing.  Fact Sheet for Patients: EntrepreneurPulse.com.au  Fact Sheet for Healthcare Providers: IncredibleEmployment.be  This test is not yet approved or cleared by the [place] FDA and has been authorized for detection and/or diagnosis of SARS-CoV-2 by FDA under an Emergency Use Authorization (EUA). This EUA will remain in effect (meaning this test can be used) for the duration of the COVID-19 declaration under Section 564(b)(1) of the Act, 21 U.S.C. section 360bbb-3(b)(1), unless the authorization is terminated or revoked.  Performed at [facility], [address], [place], [place] [address]   Urine Drug Screen, Qualitative [facility] only)     Status: None   Collection Time: 11/04/21 11:29 PM  Result Value Ref Range   Tricyclic, Ur Screen NONE DETECTED NONE DETECTED   Amphetamines, Ur Screen NONE DETECTED NONE DETECTED   MDMA (Ecstasy)Ur Screen NONE DETECTED NONE DETECTED   Cocaine Metabolite,Ur [place] NONE DETECTED NONE DETECTED   Opiate, Ur Screen NONE DETECTED NONE DETECTED   Phencyclidine (PCP) Ur S NONE DETECTED NONE DETECTED   Cannabinoid 50 Ng, Ur [place] NONE DETECTED NONE DETECTED    Barbiturates, Ur Screen NONE DETECTED NONE DETECTED   Benzodiazepine, Ur Scrn NONE DETECTED NONE DETECTED   Methadone Scn, Ur NONE DETECTED NONE DETECTED    Comment: (NOTE) Tricyclics + metabolites, urine    Cutoff 1000 ng/mL Amphetamines + metabolites, urine  Cutoff 1000 ng/mL MDMA (Ecstasy), urine              Cutoff 500 ng/mL Cocaine Metabolite, urine          Cutoff 300 ng/mL Opiate + metabolites, urine        Cutoff 300 ng/mL Phencyclidine (PCP), urine         Cutoff 25 ng/mL Cannabinoid, urine                 Cutoff 50 ng/mL Barbiturates + metabolites, urine  Cutoff 200 ng/mL Benzodiazepine, urine              Cutoff 200 ng/mL Methadone, urine                   Cutoff 300 ng/mL  The urine drug screen provides only a preliminary, unconfirmed analytical test result and should not be used for non-medical purposes. Clinical consideration and professional judgment should be applied to any positive drug screen result due to possible interfering substances. A more specific alternate chemical method must be used in order to obtain a confirmed analytical result. Gas chromatography / mass spectrometry (GC/MS) is the preferred confirm atory method. Performed at [facility], [address] [place], [place] [address]     No current facility-administered medications for this encounter.   Current Outpatient Medications  Medication Sig Dispense Refill   acetaminophen (TYLENOL) 325 MG tablet Take 2 tablets (650  mg total) by mouth every 4 (four) hours as needed for mild pain (or temp > 37.5 C (99.5 F)).     amLODipine (NORVASC) 10 MG tablet Take 1 tablet (10 mg total) by mouth daily.     aspirin EC 81 MG tablet Take 1 tablet (81 mg total) by mouth 2 (two) times daily. For DVT prophylaxis for 30 days after surgery. 60 tablet 0   cetirizine (ZYRTEC) 10 MG tablet Take 10 mg by mouth daily.     cloNIDine (CATAPRES - DOSED IN MG/24 HR) 0.3 mg/24hr patch Place 1 patch (0.3 mg total)  onto the skin once a week. (Patient taking differently: Place 0.3 mg onto the skin every Friday.) 4 patch 12   clopidogrel (PLAVIX) 75 MG tablet Take 1 tablet (75 mg total) by mouth daily. 90 tablet 0   ferrous sulfate 325 (65 FE) MG tablet Take 325 mg by mouth daily with breakfast.     ipratropium-albuterol (DUONEB) 0.5-2.5 (3) MG/3ML SOLN Take 3 mLs by nebulization every 4 (four) hours as needed. 360 mL    isosorbide mononitrate (IMDUR) 30 MG 24 hr tablet Take 30 mg by mouth every evening.      levothyroxine (SYNTHROID, LEVOTHROID) 50 MCG tablet Take 1 tablet (50 mcg total) by mouth daily. 90 tablet 0   lisinopril (PRINIVIL,ZESTRIL) 40 MG tablet Take 1 tablet (40 mg total) by mouth daily. 90 tablet 3   metFORMIN (GLUCOPHAGE) 1000 MG tablet Take 1,000 mg by mouth 2 (two) times daily.     metoprolol succinate (TOPROL-XL) 25 MG 24 hr tablet Take 25 mg by mouth every evening.      Multiple Vitamin (MULTIVITAMIN) tablet Take 1 tablet by mouth daily.       nitroGLYCERIN (NITROSTAT) 0.4 MG SL tablet Place 0.4 mg under the tongue every 5 (five) minutes as needed for chest pain.     Omega-3 Fatty Acids (FISH OIL) 1000 MG CAPS Take 1,000 mg by mouth at bedtime.     pravastatin (PRAVACHOL) 20 MG tablet Take 20 mg by mouth at bedtime.     rOPINIRole (REQUIP) 0.5 MG tablet Take 2 tablets (1 mg total) by mouth at bedtime. 60 tablet 0   spironolactone (ALDACTONE) 25 MG tablet Take 1 tablet (25 mg total) by mouth daily. 90 tablet 0   vitamin B-12 (CYANOCOBALAMIN) 1000 MCG tablet Take 1 tablet (1,000 mcg total) by mouth daily. 30 tablet 0    Musculoskeletal: Strength & Muscle Tone: decreased Gait & Station: unsteady Patient leans: Backward  Psychiatric Specialty Exam:  Presentation  General Appearance:  Appropriate for Environment  Eye Contact: Good  Speech: Clear and Coherent  Speech Volume: Normal  Handedness: Right   Mood and Affect  Mood: Hopeless; Depressed  Affect: Blunt;  Depressed   Thought Process  Thought Processes: Coherent  Descriptions of Associations:Intact  Orientation:Full (Time, Place and Person)  Thought Content:Logical  History of Schizophrenia/Schizoaffective disorder:No data recorded Duration of Psychotic Symptoms:No data recorded Hallucinations:Hallucinations: None  Ideas of Reference:None  Suicidal Thoughts:Suicidal Thoughts: Yes, Active SI Active Intent and/or Plan: With Intent; With Plan; With Means to Carry Out; With Access to Means  Homicidal Thoughts:Homicidal Thoughts: No   Sensorium  Memory: Immediate Good; Recent Good; Remote Good  Judgment: Poor  Insight: Poor   Executive Functions  Concentration: Good  Attention Span: Good  Recall: Good  Fund of Knowledge: Good  Language: Good   Psychomotor Activity  Psychomotor Activity: Psychomotor Activity: Normal   Assets  Assets: Communication Skills;  Desire for Improvement; Physical Health; Resilience; Social Support   Sleep  Sleep: Sleep: Fair   Physical Exam: Physical Exam Vitals and nursing note reviewed.  Constitutional:      Appearance: Normal appearance. He is normal weight.  HENT:     Head: Normocephalic and atraumatic.     Right Ear: External ear normal.     Left Ear: External ear normal.     Nose: Nose normal.     Mouth/Throat:     Mouth: Mucous membranes are moist.  Cardiovascular:     Rate and Rhythm: Normal rate and regular rhythm.     Pulses: Normal pulses.  Pulmonary:     Effort: Pulmonary effort is normal.  Musculoskeletal:        General: Normal range of motion.     Cervical back: Normal range of motion and neck supple.  Neurological:     Mental Status: He is alert and oriented to person, place, and time.     Motor: Weakness present.     Gait: Gait abnormal.  Psychiatric:        Attention and Perception: Attention and perception normal.        Mood and Affect: Mood is depressed. Affect is blunt and flat.         Speech: Speech normal.        Behavior: Behavior is cooperative.        Thought Content: Thought content includes suicidal ideation. Thought content includes suicidal plan.        Cognition and Memory: Cognition and memory normal.        Judgment: Judgment is impulsive and inappropriate.    Review of Systems  Psychiatric/Behavioral:  Positive for depression and suicidal ideas. The patient is nervous/anxious and has insomnia.   All other systems reviewed and are negative.  Blood pressure (!) 146/94, pulse 63, temperature 97.8 F (36.6 C), temperature source Oral, resp. rate 14, height '5\' 8"'$  (1.727 m), weight 86.2 kg, SpO2 96 %. Body mass index is 28.89 kg/m.  Treatment Plan Summary: Plan - Patient does meet criteria for geriatric-psychiatric inpatient admission  Disposition: Recommend psychiatric Inpatient admission when medically cleared. Supportive therapy provided about ongoing stressors.  Caroline Sauger, NP 11/05/2021 3:17 AM

## 2021-11-05 NOTE — ED Notes (Addendum)
Daughter-in-law [name] at bedside to visit. Belongings kept at [employer] station, informed of visitor policy.

## 2021-11-05 NOTE — ED Notes (Signed)
Nurse talked to Patient and He talked about his wife having dementia and how it is so hard for him to accept,because they always have had a happy marriage, never one argument,and they had traveled 37 countries together and had such a wonderful marriage. Nurse listened and showed empathy, and He denies having Si at the present time, but that He is extremely depressed. Staff will continue to monitor.

## 2021-11-06 ENCOUNTER — Inpatient Hospital Stay
Admission: AD | Admit: 2021-11-06 | Discharge: 2021-11-14 | DRG: 918 | Disposition: A | Discharge status: Home / Self Care | Payer: Medicare Other | Source: Skilled Nursing Facility | Attending: Psychiatry | Admitting: Psychiatry

## 2021-11-06 ENCOUNTER — Other Ambulatory Visit: Payer: Self-pay

## 2021-11-06 ENCOUNTER — Encounter: Payer: Self-pay | Admitting: Psychiatry

## 2021-11-06 DIAGNOSIS — Z8673 Personal history of transient ischemic attack (TIA), and cerebral infarction without residual deficits: Secondary | ICD-10-CM

## 2021-11-06 DIAGNOSIS — Z87891 Personal history of nicotine dependence: Secondary | ICD-10-CM | POA: Diagnosis not present

## 2021-11-06 DIAGNOSIS — E0789 Other specified disorders of thyroid: Secondary | ICD-10-CM | POA: Diagnosis present

## 2021-11-06 DIAGNOSIS — Z1152 Encounter for screening for COVID-19: Secondary | ICD-10-CM

## 2021-11-06 DIAGNOSIS — Z825 Family history of asthma and other chronic lower respiratory diseases: Secondary | ICD-10-CM

## 2021-11-06 DIAGNOSIS — Z818 Family history of other mental and behavioral disorders: Secondary | ICD-10-CM | POA: Diagnosis not present

## 2021-11-06 DIAGNOSIS — Z7984 Long term (current) use of oral hypoglycemic drugs: Secondary | ICD-10-CM | POA: Diagnosis not present

## 2021-11-06 DIAGNOSIS — Z981 Arthrodesis status: Secondary | ICD-10-CM | POA: Diagnosis not present

## 2021-11-06 DIAGNOSIS — Z808 Family history of malignant neoplasm of other organs or systems: Secondary | ICD-10-CM | POA: Diagnosis not present

## 2021-11-06 DIAGNOSIS — G2581 Restless legs syndrome: Secondary | ICD-10-CM | POA: Diagnosis present

## 2021-11-06 DIAGNOSIS — F322 Major depressive disorder, single episode, severe without psychotic features: Principal | ICD-10-CM | POA: Diagnosis present

## 2021-11-06 DIAGNOSIS — F332 Major depressive disorder, recurrent severe without psychotic features: Secondary | ICD-10-CM | POA: Diagnosis present

## 2021-11-06 DIAGNOSIS — I25118 Atherosclerotic heart disease of native coronary artery with other forms of angina pectoris: Secondary | ICD-10-CM | POA: Diagnosis present

## 2021-11-06 DIAGNOSIS — Z8249 Family history of ischemic heart disease and other diseases of the circulatory system: Secondary | ICD-10-CM

## 2021-11-06 DIAGNOSIS — R45851 Suicidal ideations: Secondary | ICD-10-CM | POA: Diagnosis present

## 2021-11-06 DIAGNOSIS — T39312A Poisoning by propionic acid derivatives, intentional self-harm, initial encounter: Principal | ICD-10-CM | POA: Diagnosis present

## 2021-11-06 DIAGNOSIS — Z636 Dependent relative needing care at home: Secondary | ICD-10-CM

## 2021-11-06 DIAGNOSIS — Z85820 Personal history of malignant melanoma of skin: Secondary | ICD-10-CM

## 2021-11-06 DIAGNOSIS — Z96643 Presence of artificial hip joint, bilateral: Secondary | ICD-10-CM | POA: Diagnosis present

## 2021-11-06 DIAGNOSIS — E1140 Type 2 diabetes mellitus with diabetic neuropathy, unspecified: Secondary | ICD-10-CM | POA: Diagnosis present

## 2021-11-06 DIAGNOSIS — I10 Essential (primary) hypertension: Secondary | ICD-10-CM | POA: Diagnosis present

## 2021-11-06 DIAGNOSIS — Z79890 Hormone replacement therapy (postmenopausal): Secondary | ICD-10-CM | POA: Diagnosis not present

## 2021-11-06 MED ORDER — SPIRONOLACTONE 25 MG PO TABS
25.0000 mg | ORAL_TABLET | Freq: Every day | ORAL | Status: DC
  Administered 2021-11-07 – 2021-11-14 (×8): 25 mg via ORAL
  Filled 2021-11-06 (×8): qty 1

## 2021-11-06 MED ORDER — VITAMIN B-12 1000 MCG PO TABS
1000.0000 ug | ORAL_TABLET | Freq: Every day | ORAL | Status: DC
  Administered 2021-11-07 – 2021-11-14 (×8): 1000 ug via ORAL
  Filled 2021-11-06 (×8): qty 1

## 2021-11-06 MED ORDER — ASPIRIN 81 MG PO TBEC
81.0000 mg | DELAYED_RELEASE_TABLET | Freq: Two times a day (BID) | ORAL | Status: DC
  Administered 2021-11-06 – 2021-11-14 (×16): 81 mg via ORAL
  Filled 2021-11-06 (×16): qty 1

## 2021-11-06 MED ORDER — METOPROLOL SUCCINATE ER 25 MG PO TB24
25.0000 mg | ORAL_TABLET | Freq: Every evening | ORAL | Status: DC
  Administered 2021-11-06 – 2021-11-13 (×8): 25 mg via ORAL
  Filled 2021-11-06 (×8): qty 1

## 2021-11-06 MED ORDER — MAGNESIUM HYDROXIDE 400 MG/5ML PO SUSP: 30.0000 mL | Freq: Every day | ORAL | Status: DC | PRN

## 2021-11-06 MED ORDER — AMLODIPINE BESYLATE 5 MG PO TABS
10.0000 mg | ORAL_TABLET | Freq: Every day | ORAL | Status: DC
  Administered 2021-11-07 – 2021-11-14 (×8): 10 mg via ORAL
  Filled 2021-11-06 (×8): qty 2

## 2021-11-06 MED ORDER — ROPINIROLE HCL 1 MG PO TABS
1.0000 mg | ORAL_TABLET | Freq: Every day | ORAL | Status: DC
  Administered 2021-11-06 – 2021-11-13 (×8): 1 mg via ORAL
  Filled 2021-11-06 (×9): qty 1

## 2021-11-06 MED ORDER — LEVOTHYROXINE SODIUM 50 MCG PO TABS
50.0000 ug | ORAL_TABLET | Freq: Every day | ORAL | Status: DC
  Administered 2021-11-07 – 2021-11-14 (×7): 50 ug via ORAL
  Filled 2021-11-06 (×8): qty 1

## 2021-11-06 MED ORDER — METFORMIN HCL 500 MG PO TABS
1000.0000 mg | ORAL_TABLET | Freq: Two times a day (BID) | ORAL | Status: DC
  Administered 2021-11-07 – 2021-11-14 (×15): 1000 mg via ORAL
  Filled 2021-11-06 (×15): qty 2

## 2021-11-06 MED ORDER — PRAVASTATIN SODIUM 40 MG PO TABS
20.0000 mg | ORAL_TABLET | Freq: Every day | ORAL | Status: DC
  Administered 2021-11-06 – 2021-11-13 (×8): 20 mg via ORAL
  Filled 2021-11-06 (×8): qty 1

## 2021-11-06 MED ORDER — CLOPIDOGREL BISULFATE 75 MG PO TABS
75.0000 mg | ORAL_TABLET | Freq: Every day | ORAL | Status: DC
  Administered 2021-11-07 – 2021-11-14 (×8): 75 mg via ORAL
  Filled 2021-11-06 (×8): qty 1

## 2021-11-06 MED ORDER — ISOSORBIDE MONONITRATE ER 30 MG PO TB24
30.0000 mg | ORAL_TABLET | Freq: Every evening | ORAL | Status: DC
  Administered 2021-11-06 – 2021-11-13 (×8): 30 mg via ORAL
  Filled 2021-11-06 (×9): qty 1

## 2021-11-06 MED ORDER — CLONIDINE HCL 0.3 MG/24HR TD PTWK
0.3000 mg | MEDICATED_PATCH | TRANSDERMAL | Status: DC
  Filled 2021-11-06: qty 1

## 2021-11-06 MED ORDER — ALUM & MAG HYDROXIDE-SIMETH 200-200-20 MG/5ML PO SUSP: 30.0000 mL | ORAL | Status: DC | PRN

## 2021-11-06 MED ORDER — ACETAMINOPHEN 325 MG PO TABS: 650.0000 mg | ORAL_TABLET | Freq: Four times a day (QID) | ORAL | Status: DC | PRN

## 2021-11-06 MED ORDER — LISINOPRIL 20 MG PO TABS
40.0000 mg | ORAL_TABLET | Freq: Every day | ORAL | Status: DC
  Administered 2021-11-07 – 2021-11-14 (×8): 40 mg via ORAL
  Filled 2021-11-06 (×8): qty 2

## 2021-11-06 MED ORDER — ADULT MULTIVITAMIN W/MINERALS CH
1.0000 | ORAL_TABLET | Freq: Every day | ORAL | Status: DC
  Administered 2021-11-07 – 2021-11-14 (×8): 1 via ORAL
  Filled 2021-11-06 (×8): qty 1

## 2021-11-06 MED ORDER — ESCITALOPRAM OXALATE 10 MG PO TABS
20.0000 mg | ORAL_TABLET | Freq: Every day | ORAL | Status: DC
  Administered 2021-11-07 – 2021-11-14 (×8): 20 mg via ORAL
  Filled 2021-11-06 (×8): qty 2

## 2021-11-06 MED ORDER — FERROUS SULFATE 325 (65 FE) MG PO TABS
325.0000 mg | ORAL_TABLET | Freq: Every day | ORAL | Status: DC
  Administered 2021-11-07 – 2021-11-14 (×8): 325 mg via ORAL
  Filled 2021-11-06 (×8): qty 1

## 2021-11-06 NOTE — ED Notes (Signed)
INVOLUNTARY/awaiting a bed on geri psych

## 2021-11-06 NOTE — Progress Notes (Signed)
Patient presents from [facility] assisted living after an overdose on Ibuprofen. Patient's main stressor is that he has been taking care of his wife with dementia for the last year and a half. Patient says that his wife will not allow staff at [facility] to help her, so he does it all. Patient continues to endorse passive SI. He denies HI and AVH. Patient says he does have children that live nearby and are a great support. Patient denies alcohol abuse, substance abuse, and smoking.   Skin assessment completed with Loree Fee, RN. Patient has a bruise on his R arm. He states that he has weakness in his R arm and leg from a prior stroke.   Patient is oriented to the unit and her room. Patient remains safe on the unit at this time.

## 2021-11-06 NOTE — Tx Team (Signed)
Initial Treatment Plan 11/06/2021 7:06 PM Steve Ramirez [id number]    PATIENT STRESSORS: Other: Patient is having to care for his wife who has dementia     PATIENT STRENGTHS: Communication skills  General fund of knowledge  Supportive family/friends    PATIENT IDENTIFIED PROBLEMS: Has to care for his wife because she has dementia                     DISCHARGE CRITERIA:  Ability to meet basic life and health needs Adequate post-discharge living arrangements Improved stabilization in mood, thinking, and/or behavior  PRELIMINARY DISCHARGE PLAN: Return to previous living arrangement  PATIENT/FAMILY INVOLVEMENT: This treatment plan has been presented to and reviewed with the patient, Steve Ramirez.  The patient has been given the opportunity to ask questions and make suggestions.  Nolon Bussing, RN 11/06/2021, 7:06 PM

## 2021-11-06 NOTE — ED Notes (Signed)
Hospital meal provided, pt tolerated w/o complaints.  Waste discarded appropriately.  

## 2021-11-06 NOTE — Progress Notes (Signed)
Patient verbalized at length about his despair over his wife's deteriorating mental condition. They have been married 74 years and have always been very close. He stated that he feels that he does not have "the fortitude" to handle what is happening with her. He cannot make any decisions about the future and was feeling very hopeless when he overdosed. This situation is the worst thing he has ever been through. He agrees that he will seek staff if he feels overwhelmed and understands that he does need help to get through this experience.

## 2021-11-06 NOTE — ED Notes (Signed)
IVC/pending geri-psych admit

## 2021-11-06 NOTE — ED Notes (Signed)
Patient took self to restroom and changed incontinence brief.

## 2021-11-06 NOTE — Consult Note (Signed)
[facility] Psych ED Progress Note  11/06/2021 6:22 AM Steve Ramirez  MRN:  [id number]   Method of visit?: Face to Face   Subjective:  "I'm good with going" to the inpatient unit.  10/26:  Client continues to be depressed, misses his wife.  He is agreeable to go to geriatric inpatient for stabilization.  Pleasant and calm.  10/25: On reassessment this afternoon, the patient is talkative, contemplating past experiences that he had with his wife and that they had a wonderful life, over 52 years together, and life is just so boring now.  He states, there is still a little bit we can do."  He states that he is just tired of constantly reminding his wife of just little things that she needs to do.  He has to constantly repeat himself.  Patient expresses hopelessness.  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  He denies active suicidal thoughts at this time, but a conversation expresses passive suicidal ideation.  Discussion with patient regarding how inpatient psychiatric hospitalization may help to improve his mood.  Also discussed making a schedule with his family so that he can get some breaks from taking care of his wife during the day.  Patient states that he does have good family support  HPI on admission: Steve Ramirez is a 86 y.o. male patient presented to [facility] via EMS from [facility] facility under involuntary commitment status (IVC). The patient reports severe depression and reports he took a handful of medication, which he estimates 75 tablets of 200 mg ibuprofen at 2100. The patient shared that he lives at the facility with his wife, who has dementia. He shared that the disease has worsened in the past six months. He and his wife have been together for 23 years, and his wife's illness has taken a toll on him. He shared that even though he and his wife reside in a nursing facility, his wife refused  to let the nursing staff assist her. He said his wife does not listen to him when he tries to help her. The patient shared that he feels overwhelmed and exhausted. The patient stated, "I should know better. 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 I have had 16 strokes." The patient shared he became depressed, and he was not thinking, and intentionally ingested 75 ibuprofen tablets.Steve Ramirez is a 86 y.o. male patient presented to [facility] via EMS from [place] facility under involuntary commitment status (IVC). The patient reports severe depression and reports he took a handful of medication, which he estimates 75 tablets of 200 mg ibuprofen at 2100. The patient shared that he lives at the facility with his wife, who has dementia. He shared that the disease has worsened in the past six months. He and his wife have been together for 63 years, and his wife's illness has taken a toll on him. He shared that even though he and his wife reside in a nursing facility, his wife refused to let the nursing staff assist her. He said his wife does not listen to him when he tries to help her. The patient shared that he feels overwhelmed and exhausted. The patient stated, "I should know better. 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 I have had 16 strokes." The patient shared he became depressed, and he was not thinking, and intentionally ingested 75 ibuprofen tablets.  Inpatient psychiatric hospitalization continues to be warranted.   Principal Problem: Major depressive disorder, recurrent severe without psychotic features ([place]) Diagnosis:  Principal Problem:   Major depressive disorder, recurrent severe without psychotic features ([place]) Active Problems:   History of TIAs   Elevated transaminase level   Acute ischemic stroke (HCC)    Essential hypertension   Depression   CAD (coronary artery disease)   Altered mental status   Type 2 diabetes mellitus with diabetic neuropathy, without long-term current use of insulin (HCC)   Intentional overdose ([place])  Total Time spent with patient: 30 minutes  Past Psychiatric History:   Past Medical History:  Past Medical History:  Diagnosis Date   Coronary atherosclerosis of native coronary artery    Essential hypertension, benign    GIB (gastrointestinal bleeding)    Colonic diverticular bleed   History of melanoma    History of TIAs    Stroke ([place])    Type 2 diabetes mellitus ([place])     Past Surgical History:  Procedure Laterality Date   COLONOSCOPY  08/13/2010   Procedure: COLONOSCOPY;  Surgeon: Rogene Houston, MD;  Location: [facility];  Service: Endoscopy;  Laterality: N/A;   JOINT REPLACEMENT     KNEE SURGERY     arthroscopic   LEFT HEART CATHETERIZATION WITH CORONARY ANGIOGRAM N/A 11/24/2010   Procedure: LEFT HEART CATHETERIZATION WITH CORONARY ANGIOGRAM;  Surgeon: Josue Hector, MD;  Location: [facility] CATH LAB;  Service: Cardiovascular;  Laterality: N/A;   neck fusion     years ago   TOTAL HIP ARTHROPLASTY Right 08/02/2018   Procedure: TOTAL HIP ARTHROPLASTY ANTERIOR APPROACH;  Surgeon: Renette Butters, MD;  Location: [facility] ORS;  Service: Orthopedics;  Laterality: Right;   TOTAL HIP ARTHROPLASTY Left 10/18/2018   Procedure: TOTAL HIP ARTHROPLASTY ANTERIOR APPROACH;  Surgeon: Renette Butters, MD;  Location: [facility] ORS;  Service: Orthopedics;  Laterality: Left;   Family History:  Family History  Problem Relation Age of Onset   Coronary artery disease Father    Asthma Other    Emphysema Mother    Coronary artery disease Paternal Uncle    Brain cancer Brother    Family Psychiatric  History:  Social History:  Social History   Substance and Sexual Activity  Alcohol Use Yes   Comment: occasionally     Social History   Substance and Sexual Activity  Drug Use  No    Social History   Socioeconomic History   Marital status: Married    Spouse name: Not on file   Number of children: Not on file   Years of education: Not on file   Highest education level: Not on file  Occupational History   Occupation: retired  Tobacco Use   Smoking status: Former    Types: Cigarettes    Quit date: 01/13/1980    Years since quitting: 41.8   Smokeless tobacco: Never  Vaping Use   Vaping Use: Never used  Substance and Sexual Activity   Alcohol use: Yes    Comment: occasionally   Drug use: No   Sexual activity: Not Currently    Partners: Female  Other Topics Concern   Not on file  Social History Narrative   Lives with wife. Grew up in [place] and after college moved to [place]. Got  married. Moved to [place] and then to [place], [place].    Attends [employer].    Masters health education, administration   3 children   Caffeine- 1 pot coffee daily.   Daughter is a Education officer, museum. Son is a Higher education careers adviser. Son is in [place] and Re[employer].       Social Determinants of Health   Financial Resource Strain: Not on file  Food Insecurity: Not on file  Transportation Needs: Not on file  Physical Activity: Not on file  Stress: Not on file  Social Connections: Not on file    Sleep: Fair  Appetite:  Fair  Current Medications: Current Facility-Administered Medications  Medication Dose Route Frequency Provider Last Rate Last Admin   amLODipine (NORVASC) tablet 10 mg  10 mg Oral Daily Merlyn Lot, MD   10 mg at 11/05/21 1348   aspirin EC tablet 81 mg  81 mg Oral BID Merlyn Lot, MD   81 mg at 11/05/21 2236   [START ON 11/07/2021] cloNIDine (CATAPRES - Dosed in mg/24 hr) patch 0.3 mg  0.3 mg Transdermal Q Norton Blizzard, MD       clopidogrel (PLAVIX) tablet 75 mg  75 mg Oral Daily Merlyn Lot, MD   75 mg at 11/05/21 1348   cyanocobalamin (VITAMIN B12) tablet 1,000 mcg  1,000 mcg Oral Daily Merlyn Lot, MD   1,000 mcg at  11/05/21 1348   escitalopram (LEXAPRO) tablet 20 mg  20 mg Oral Daily Waldon Merl F, NP   20 mg at 11/05/21 1348   ferrous sulfate tablet 325 mg  325 mg Oral Q breakfast Merlyn Lot, MD       isosorbide mononitrate (IMDUR) 24 hr tablet 30 mg  30 mg Oral QPM Merlyn Lot, MD   30 mg at 11/05/21 1810   levothyroxine (SYNTHROID) tablet 50 mcg  50 mcg Oral Daily Merlyn Lot, MD   50 mcg at 11/06/21 0606   lisinopril (ZESTRIL) tablet 40 mg  40 mg Oral Daily Merlyn Lot, MD   40 mg at 11/05/21 1347   metFORMIN (GLUCOPHAGE) tablet 1,000 mg  1,000 mg Oral BID Merlyn Lot, MD   1,000 mg at 11/05/21 2236   metoprolol succinate (TOPROL-XL) 24 hr tablet 25 mg  25 mg Oral QPM Merlyn Lot, MD   25 mg at 11/05/21 1809   multivitamin with minerals tablet 1 tablet  1 tablet Oral Daily Merlyn Lot, MD       pravastatin (PRAVACHOL) tablet 20 mg  20 mg Oral QHS Merlyn Lot, MD   20 mg at 11/05/21 2236   rOPINIRole (REQUIP) tablet 1 mg  1 mg Oral QHS Merlyn Lot, MD   1 mg at 11/05/21 2236   spironolactone (ALDACTONE) tablet 25 mg  25 mg Oral Daily Merlyn Lot, MD   25 mg at 11/05/21 1348   Current Outpatient Medications  Medication Sig Dispense Refill   amLODipine (NORVASC) 10 MG tablet Take 1 tablet (10 mg total) by mouth daily.     aspirin EC 81 MG tablet Take 1 tablet (81 mg total) by mouth 2 (two) times daily. For DVT prophylaxis for 30 days after surgery. 60 tablet 0   betamethasone dipropionate 0.05 % cream Apply 1 Application topically 2 (two) times daily as needed.     clopidogrel (PLAVIX) 75 MG tablet Take 1 tablet (75 mg total) by mouth daily. 90 tablet 0   escitalopram (LEXAPRO) 20 MG tablet Take 20 mg by mouth daily.     ferrous sulfate  325 (65 FE) MG tablet Take 325 mg by mouth daily with breakfast.     isosorbide mononitrate (IMDUR) 30 MG 24 hr tablet Take 30 mg by mouth every evening.      levothyroxine (SYNTHROID, LEVOTHROID) 50 MCG  tablet Take 1 tablet (50 mcg total) by mouth daily. 90 tablet 0   lisinopril (PRINIVIL,ZESTRIL) 40 MG tablet Take 1 tablet (40 mg total) by mouth daily. 90 tablet 3   metFORMIN (GLUCOPHAGE) 1000 MG tablet Take 1,000 mg by mouth 2 (two) times daily.     metoprolol succinate (TOPROL-XL) 25 MG 24 hr tablet Take 25 mg by mouth every evening.      Multiple Vitamin (MULTIVITAMIN) tablet Take 1 tablet by mouth daily.       Omega-3 Fatty Acids (FISH OIL) 1000 MG CAPS Take 1,000 mg by mouth at bedtime.     pravastatin (PRAVACHOL) 20 MG tablet Take 20 mg by mouth at bedtime.     spironolactone (ALDACTONE) 25 MG tablet Take 1 tablet (25 mg total) by mouth daily. 90 tablet 0   vitamin B-12 (CYANOCOBALAMIN) 1000 MCG tablet Take 1 tablet (1,000 mcg total) by mouth daily. 30 tablet 0   acetaminophen (TYLENOL) 325 MG tablet Take 2 tablets (650 mg total) by mouth every 4 (four) hours as needed for mild pain (or temp > 37.5 C (99.5 F)).     cetirizine (ZYRTEC) 10 MG tablet Take 10 mg by mouth daily.     cloNIDine (CATAPRES - DOSED IN MG/24 HR) 0.3 mg/24hr patch Place 1 patch (0.3 mg total) onto the skin once a week. (Patient taking differently: Place 0.3 mg onto the skin every Friday.) 4 patch 12   ipratropium-albuterol (DUONEB) 0.5-2.5 (3) MG/3ML SOLN Take 3 mLs by nebulization every 4 (four) hours as needed. 360 mL    nitroGLYCERIN (NITROSTAT) 0.4 MG SL tablet Place 0.4 mg under the tongue every 5 (five) minutes as needed for chest pain.     rOPINIRole (REQUIP) 0.5 MG tablet Take 2 tablets (1 mg total) by mouth at bedtime. 60 tablet 0    Lab Results:  Results for orders placed or performed during the hospital encounter of 11/04/21 (from the past 48 hour(s))  CBC     Status: None   Collection Time: 11/04/21 11:24 PM  Result Value Ref Range   WBC 5.5 4.0 - 10.5 K/uL   RBC 5.04 4.22 - 5.81 MIL/uL   Hemoglobin 13.3 13.0 - 17.0 g/dL   HCT 40.7 39.0 - 52.0 %   MCV 80.8 80.0 - 100.0 fL   MCH 26.4 26.0 - 34.0 pg    MCHC 32.7 30.0 - 36.0 g/dL   RDW 14.6 11.5 - 15.5 %   Platelets 263 150 - 400 K/uL   nRBC 0.0 0.0 - 0.2 %    Comment: Performed at [facility], [address], [place], [place] [address]  Comprehensive metabolic panel     Status: Abnormal   Collection Time: 11/04/21 11:24 PM  Result Value Ref Range   Sodium 138 135 - 145 mmol/L   Potassium 4.3 3.5 - 5.1 mmol/L   Chloride 106 98 - 111 mmol/L   CO2 22 22 - 32 mmol/L   Glucose, Bld 126 (H) 70 - 99 mg/dL    Comment: Glucose reference range applies only to samples taken after fasting for at least 8 hours.   BUN 19 8 - 23 mg/dL   Creatinine, Ser 1.22 0.61 - 1.24 mg/dL   Calcium 9.5  8.9 - 10.3 mg/dL   Total Protein 7.6 6.5 - 8.1 g/dL   Albumin 4.5 3.5 - 5.0 g/dL   AST 21 15 - 41 U/L   ALT 25 0 - 44 U/L   Alkaline Phosphatase 60 38 - 126 U/L   Total Bilirubin 0.3 0.3 - 1.2 mg/dL   GFR, Estimated 58 (L) >60 mL/min    Comment: (NOTE) Calculated using the CKD-EPI Creatinine Equation (2021)    Anion gap 10 5 - 15    Comment: Performed at [facility], [address] [place], [place] [address]  Ethanol     Status: None   Collection Time: 11/04/21 11:24 PM  Result Value Ref Range   Alcohol, Ethyl (B) <10 <10 mg/dL    Comment: (NOTE) Lowest detectable limit for serum alcohol is 10 mg/dL.  For medical purposes only. Performed at [facility], [address] [place], [place] [address]   Acetaminophen level     Status: Abnormal   Collection Time: 11/04/21 11:24 PM  Result Value Ref Range   Acetaminophen (Tylenol), Serum <10 (L) 10 - 30 ug/mL    Comment: (NOTE) Therapeutic concentrations vary significantly. A range of 10-30 ug/mL  may be an effective concentration for many patients. However, some  are best treated at concentrations outside of this range. Acetaminophen concentrations >150 ug/mL at 4 hours after ingestion  and >50 ug/mL at 12 hours after ingestion are often associated with  toxic  reactions.  Performed at [facility], [address] [place], [place] [address]   Salicylate level     Status: Abnormal   Collection Time: 11/04/21 11:24 PM  Result Value Ref Range   Salicylate Lvl <8.0 (L) 7.0 - 30.0 mg/dL    Comment: Performed at [facility], [address] [place], [place] [address]  Resp Panel by RT-PCR (Flu A&B, Covid) Anterior Nasal Swab     Status: None   Collection Time: 11/04/21 11:29 PM   Specimen: Anterior Nasal Swab  Result Value Ref Range   SARS Coronavirus 2 by RT PCR NEGATIVE NEGATIVE    Comment: (NOTE) SARS-CoV-2 target nucleic acids are NOT DETECTED.  The SARS-CoV-2 RNA is generally detectable in upper respiratory specimens during the acute phase of infection. The lowest concentration of SARS-CoV-2 viral copies this assay can detect is 138 copies/mL. A negative result does not preclude SARS-Cov-2 infection and should not be used as the sole basis for treatment or other patient management decisions. A negative result may occur with  improper specimen collection/handling, submission of specimen other than nasopharyngeal swab, presence of viral mutation(s) within the areas targeted by this assay, and inadequate number of viral copies(<138 copies/mL). A negative result must be combined with clinical observations, patient history, and epidemiological information. The expected result is Negative.  Fact Sheet for Patients:  EntrepreneurPulse.com.au  Fact Sheet for Healthcare Providers:  IncredibleEmployment.be  This test is no t yet approved or cleared by the [place] FDA and  has been authorized for detection and/or diagnosis of SARS-CoV-2 by FDA under an Emergency Use Authorization (EUA). This EUA will remain  in effect (meaning this test can be used) for the duration of the COVID-19 declaration under Section 564(b)(1) of the Act, 21 U.S.C.section 360bbb-3(b)(1), unless the  authorization is terminated  or revoked sooner.       Influenza A by PCR NEGATIVE NEGATIVE   Influenza B by PCR NEGATIVE NEGATIVE    Comment: (NOTE) The Xpert Xpress SARS-CoV-2/FLU/RSV plus assay is  intended as an aid in the diagnosis of influenza from Nasopharyngeal swab specimens and should not be used as a sole basis for treatment. Nasal washings and aspirates are unacceptable for Xpert Xpress SARS-CoV-2/FLU/RSV testing.  Fact Sheet for Patients: EntrepreneurPulse.com.au  Fact Sheet for Healthcare Providers: IncredibleEmployment.be  This test is not yet approved or cleared by the [place] FDA and has been authorized for detection and/or diagnosis of SARS-CoV-2 by FDA under an Emergency Use Authorization (EUA). This EUA will remain in effect (meaning this test can be used) for the duration of the COVID-19 declaration under Section 564(b)(1) of the Act, 21 U.S.C. section 360bbb-3(b)(1), unless the authorization is terminated or revoked.  Performed at [facility], [address] [place], [place] [address]   Urine Drug Screen, Qualitative [facility] only)     Status: None   Collection Time: 11/04/21 11:29 PM  Result Value Ref Range   Tricyclic, Ur Screen NONE DETECTED NONE DETECTED   Amphetamines, Ur Screen NONE DETECTED NONE DETECTED   MDMA (Ecstasy)Ur Screen NONE DETECTED NONE DETECTED   Cocaine Metabolite,Ur [place] NONE DETECTED NONE DETECTED   Opiate, Ur Screen NONE DETECTED NONE DETECTED   Phencyclidine (PCP) Ur S NONE DETECTED NONE DETECTED   Cannabinoid 50 Ng, Ur [place] NONE DETECTED NONE DETECTED   Barbiturates, Ur Screen NONE DETECTED NONE DETECTED   Benzodiazepine, Ur Scrn NONE DETECTED NONE DETECTED   Methadone Scn, Ur NONE DETECTED NONE DETECTED    Comment: (NOTE) Tricyclics + metabolites, urine    Cutoff 1000 ng/mL Amphetamines + metabolites, urine  Cutoff 1000 ng/mL MDMA (Ecstasy), urine              Cutoff 500  ng/mL Cocaine Metabolite, urine          Cutoff 300 ng/mL Opiate + metabolites, urine        Cutoff 300 ng/mL Phencyclidine (PCP), urine         Cutoff 25 ng/mL Cannabinoid, urine                 Cutoff 50 ng/mL Barbiturates + metabolites, urine  Cutoff 200 ng/mL Benzodiazepine, urine              Cutoff 200 ng/mL Methadone, urine                   Cutoff 300 ng/mL  The urine drug screen provides only a preliminary, unconfirmed analytical test result and should not be used for non-medical purposes. Clinical consideration and professional judgment should be applied to any positive drug screen result due to possible interfering substances. A more specific alternate chemical method must be used in order to obtain a confirmed analytical result. Gas chromatography / mass spectrometry (GC/MS) is the preferred confirm atory method. Performed at [facility], [address] [place], [place] [address]   Comprehensive metabolic panel     Status: Abnormal   Collection Time: 11/05/21  3:03 AM  Result Value Ref Range   Sodium 138 135 - 145 mmol/L   Potassium 4.0 3.5 - 5.1 mmol/L   Chloride 108 98 - 111 mmol/L   CO2 23 22 - 32 mmol/L   Glucose, Bld 134 (H) 70 - 99 mg/dL    Comment: Glucose reference range applies only to samples taken after fasting for at least 8 hours.   BUN 19 8 - 23 mg/dL   Creatinine, Ser 1.01 0.61 - 1.24 mg/dL   Calcium 9.1 8.9 - 10.3 mg/dL   Total Protein 6.8  6.5 - 8.1 g/dL   Albumin 4.0 3.5 - 5.0 g/dL   AST 20 15 - 41 U/L   ALT 21 0 - 44 U/L   Alkaline Phosphatase 50 38 - 126 U/L   Total Bilirubin 0.5 0.3 - 1.2 mg/dL   GFR, Estimated >60 >60 mL/min    Comment: (NOTE) Calculated using the CKD-EPI Creatinine Equation (2021)    Anion gap 7 5 - 15    Comment: Performed at [facility], [address] [place], [place] [address]  Acetaminophen level     Status: Abnormal   Collection Time: 11/05/21  3:03 AM  Result Value Ref Range    Acetaminophen (Tylenol), Serum <10 (L) 10 - 30 ug/mL    Comment: (NOTE) Therapeutic concentrations vary significantly. A range of 10-30 ug/mL  may be an effective concentration for many patients. However, some  are best treated at concentrations outside of this range. Acetaminophen concentrations >150 ug/mL at 4 hours after ingestion  and >50 ug/mL at 12 hours after ingestion are often associated with  toxic reactions.  Performed at [facility], [address] [place], [place] [address]     Blood Alcohol level:  Lab Results  Component Value Date   [facility] <10 11/04/2021   ETH <10 11/08/2020    Physical Findings: AIMS:  , ,  ,  ,    CIWA:    COWS:     Musculoskeletal: Strength & Muscle Tone:  Did not assess Gait & Station:  Did not observe Patient leans: N/A  Psychiatric Specialty Exam: Physical Exam Vitals and nursing note reviewed.  HENT:     Head: Normocephalic.     Nose: Nose normal. No congestion or rhinorrhea.  Eyes:     General:        Right eye: No discharge.        Left eye: No discharge.  Cardiovascular:     Rate and Rhythm: Normal rate.  Pulmonary:     Effort: Pulmonary effort is normal.  Musculoskeletal:        General: Normal range of motion.     Cervical back: Normal range of motion.  Neurological:     Mental Status: He is alert and oriented to person, place, and time.  Psychiatric:        Attention and Perception: Attention normal.        Mood and Affect: Mood is anxious and depressed. Affect is blunt.        Speech: Speech normal.        Behavior: Behavior is cooperative.        Thought Content: Thought content includes suicidal (passive) ideation.        Cognition and Memory: Cognition normal.        Judgment: Judgment is impulsive.     Review of Systems  Constitutional: Negative.   HENT: Negative.    Respiratory: Negative.    Skin: Negative.   Psychiatric/Behavioral:  Positive for depression and suicidal ideas. Negative for  hallucinations, memory loss and substance abuse. The patient is not nervous/anxious and does not have insomnia.   All other systems reviewed and are negative.   Blood pressure (!) 148/62, pulse 76, temperature 97.7 F (36.5 C), temperature source Oral, resp. rate 17, height '5\' 8"'$  (1.727 m), weight 86.2 kg, SpO2 97 %.Body mass index is 28.89 kg/m.  General Appearance: Casual  Eye Contact:  Fair  Speech:  Normal Rate  Volume:  Normal  Mood:  Depressed  Affect:  Congruent  Thought Process:  Coherent  Orientation:  Full (Time, Place, and Person)  Thought Content:  Rumination  Suicidal Thoughts:  Yes.  with intent/plan  Homicidal Thoughts:  No  Memory:  Immediate;   Fair Recent;   Fair Remote;   Fair  Judgement:  Poor  Insight:  Fair  Psychomotor Activity:  Normal  Concentration:  Concentration: Fair and Attention Span: Fair  Recall:  [employer] of Knowledge:  Fair  Language:  Good  Akathisia:  No  Handed:  Right  AIMS (if indicated):     Assets:  Housing Leisure Time Resilience  ADL's:  Intact  Cognition:  WNL  Sleep:         Physical Exam: Physical Exam Vitals and nursing note reviewed.  HENT:     Head: Normocephalic.     Nose: Nose normal. No congestion or rhinorrhea.  Eyes:     General:        Right eye: No discharge.        Left eye: No discharge.  Cardiovascular:     Rate and Rhythm: Normal rate.  Pulmonary:     Effort: Pulmonary effort is normal.  Musculoskeletal:        General: Normal range of motion.     Cervical back: Normal range of motion.  Neurological:     Mental Status: He is alert and oriented to person, place, and time.  Psychiatric:        Attention and Perception: Attention normal.        Mood and Affect: Mood is anxious and depressed. Affect is blunt.        Speech: Speech normal.        Behavior: Behavior is cooperative.        Thought Content: Thought content includes suicidal (passive) ideation.        Cognition and Memory: Cognition  normal.        Judgment: Judgment is impulsive.    Review of Systems  Constitutional: Negative.   HENT: Negative.    Respiratory: Negative.    Skin: Negative.   Psychiatric/Behavioral:  Positive for depression and suicidal ideas. Negative for hallucinations, memory loss and substance abuse. The patient is not nervous/anxious and does not have insomnia.   All other systems reviewed and are negative.  Blood pressure (!) 148/62, pulse 76, temperature 97.7 F (36.5 C), temperature source Oral, resp. rate 17, height '5\' 8"'$  (1.727 m), weight 86.2 kg, SpO2 97 %. Body mass index is 28.89 kg/m.  Treatment Plan Summary: Daily contact with patient to assess and evaluate symptoms and progress in treatment and Plan admit to inpatient psychiatry .  No appropriate bed at this time.  Hopefully we will admit to our geriatric psychiatry unit tomorrow, 10/26. Restarted PTA medication for depression, Lexapro 20 mg daily.   Waylan Boga, NP 11/06/2021, 6:22 AM

## 2021-11-06 NOTE — ED Provider Notes (Signed)
Emergency Medicine Observation Re-evaluation Note  Steve Ramirez is a 86 y.o. male, seen on rounds today.  Pt initially presented to the ED for complaints of Suicidal and Ingestion Currently, the patient is resting, voices no medical complaints.  Physical Exam  BP (!) 148/62   Pulse 76   Temp 97.7 F (36.5 C) (Oral)   Resp 17   Ht '5\' 8"'$  (1.727 m)   Wt 86.2 kg   SpO2 97%   BMI 28.89 kg/m  Physical Exam General: Resting in no acute distress Cardiac: No cyanosis Lungs: Equal rise and fall Psych: Not agitated  ED Course / MDM  EKG:EKG Interpretation  Date/Time:  Tuesday November 04 2021 23:23:03 EDT Ventricular Rate:  65 PR Interval:  253 QRS Duration: 159 QT Interval:  480 QTC Calculation: 500 R Axis:   -76 Text Interpretation: Sinus rhythm Prolonged PR interval RBBB and LAFB Confirmed by UNCONFIRMED, DOCTOR ([id number]), editor Mel Almond, Tammy [phone]) on 11/05/2021 1:53:00 PM  I have reviewed the labs performed to date as well as medications administered while in observation.  Recent changes in the last 24 hours include no events overnight.  Plan  Current plan is for psychiatric disposition.    Paulette Blanch, MD 11/06/21 [phone]

## 2021-11-06 NOTE — ED Notes (Signed)
Pt family calling and requesting to speak with patient and for update. Pt states he does not wish to speak with family or give them information at this time. Pt frustrated with holding in the emergency room and that he has to remain here.

## 2021-11-06 NOTE — Group Note (Signed)
[facility] LCSW Group Therapy Note   Group Date: 11/06/2021 Start Time:  1:00 PM End Time:  2:00 PM   Type of Therapy and Topic: Group Therapy: Avoiding Self-Sabotaging and Enabling Behaviors  Participation Level: Did Not Attend  Mood:  Description of Group:  In this group, patients will learn how to identify obstacles, self-sabotaging and enabling behaviors, as well as: what are they, why do we do them and what needs these behaviors meet. Discuss unhealthy relationships and how to have positive healthy boundaries with those that sabotage and enable. Explore aspects of self-sabotage and enabling in yourself and how to limit these self-destructive behaviors in everyday life.   Therapeutic Goals: 1. Patient will identify one obstacle that relates to self-sabotage and enabling behaviors 2. Patient will identify one personal self-sabotaging or enabling behavior they did prior to admission 3. Patient will state a plan to change the above identified behavior 4. Patient will demonstrate ability to communicate their needs through discussion and/or role play.    Summary of Patient Progress:   Patient did not attend group despite encouraged participation.    Therapeutic Modalities:  Cognitive Behavioral Therapy Person-Centered Therapy Motivational Interviewing    Durenda Hurt, [place]

## 2021-11-07 DIAGNOSIS — F322 Major depressive disorder, single episode, severe without psychotic features: Secondary | ICD-10-CM

## 2021-11-07 MED ORDER — CLONIDINE HCL 0.3 MG/24HR TD PTWK
0.3000 mg | MEDICATED_PATCH | TRANSDERMAL | Status: DC
  Administered 2021-11-09: 0.3 mg via TRANSDERMAL
  Filled 2021-11-07: qty 1

## 2021-11-07 MED ORDER — CLONIDINE HCL 0.3 MG/24HR TD PTWK: 0.3000 mg | MEDICATED_PATCH | TRANSDERMAL | Status: DC

## 2021-11-07 NOTE — Progress Notes (Signed)
Patient is A+O x 4. He denies SI/HI/AVH, and anxiety. He endorses mild depression and tends to isolate to his Pain 0/10. Appetite good. Patient is articulate and cooperative. He initially had a clonidine patch scheduled to be applied today but patient explained that his off/on routine is every Sunday and applying it before then would mess things up. Order rescheduled for Sunday 10.29.23. Q15 minute unit checks in place.

## 2021-11-07 NOTE — BHH Counselor (Signed)
Patient declining aftercare referral at this time.  Assunta Curtis, MSW, LCSW 11/07/2021 12:29 PM

## 2021-11-07 NOTE — BHH Suicide Risk Assessment (Signed)
[facility] Admission Suicide Risk Assessment   Nursing information obtained from:    Demographic factors:    Current Mental Status:    Loss Factors:    Historical Factors:    Risk Reduction Factors:     Total Time spent with patient: 1 hour Principal Problem: Major depressive disorder, single episode, severe without psychosis ([place]) Diagnosis:  Principal Problem:   Major depressive disorder, single episode, severe without psychosis ([place])  Subjective Data: Steve Ramirez is a 86 y.o. male patient presented to [facility] via EMS from [place] facility under involuntary commitment status (IVC). The patient reports severe depression and reports he took a handful of medication, which he estimates 75 tablets of 200 mg ibuprofen at 2100. The patient shared that he lives at the facility with his wife, who has dementia. He shared that the disease has worsened in the past six months. He and his wife have been together for 35 years, and his wife's illness has taken a toll on him. He shared that even though he and his wife reside in a nursing facility, his wife refused to let the nursing staff assist her. He said his wife does not listen to him when he tries to help her. The patient shared that he feels overwhelmed and exhausted. The patient stated, "I should know better. 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 I have had 16 strokes." The patient shared he became depressed, and he was not thinking, and intentionally ingested 75 ibuprofen tablets.   Continued Clinical Symptoms:  Alcohol Use Disorder Identification Test Final Score (AUDIT): 1 The "Alcohol Use Disorders Identification Test", Guidelines for Use in Primary Care, Second Edition.  World Pharmacologist [facility]). Score between 0-7:  no or low risk or alcohol related problems. Score between 8-15:  moderate risk of alcohol related problems. Score between 16-19:   high risk of alcohol related problems. Score 20 or above:  warrants further diagnostic evaluation for alcohol dependence and treatment.   CLINICAL FACTORS:   Depression:   Anhedonia   Musculoskeletal: Strength & Muscle Tone: within normal limits Gait & Station: normal Patient leans: N/A  Psychiatric Specialty Exam:  Presentation  General Appearance:  Appropriate for Environment  Eye Contact: Fair  Speech: Clear and Coherent  Speech Volume: Normal  Handedness: Right   Mood and Affect  Mood: Depressed; Hopeless  Affect: Blunt; Congruent   Thought Process  Thought Processes: Coherent  Descriptions of Associations:Intact  Orientation:Full (Time, Place and Person)  Thought Content:WDL; Rumination  History of Schizophrenia/Schizoaffective disorder:No data recorded Duration of Psychotic Symptoms:No data recorded Hallucinations:No data recorded Ideas of Reference:None  Suicidal Thoughts:No data recorded Homicidal Thoughts:No data recorded  Sensorium  Memory: Immediate Good; Recent Good  Judgment: Poor  Insight: Fair   Community education officer  Concentration: Good  Attention Span: Good  Recall: Good  Fund of Knowledge: Good  Language: Fair   Psychomotor Activity  Psychomotor Activity:No data recorded  Assets  Assets: Communication Skills; Desire for Improvement; Financial Resources/Insurance; Housing; Social Support; Resilience   Sleep  Sleep:No data recorded    Blood pressure (!) 149/70, pulse 68, temperature (!) 97.4 F (36.3 C), temperature source Oral, resp. rate 18, height '5\' 8"'$  (1.727 m), weight 82.3 kg, SpO2 96 %. Body mass index is 27.6 kg/m.   COGNITIVE FEATURES THAT CONTRIBUTE TO RISK:  None    SUICIDE RISK:   Minimal: No identifiable suicidal  ideation.  Patients presenting with no risk factors but with morbid ruminations; may be classified as minimal risk based on the severity of the depressive symptoms  PLAN  OF CARE: See orders  I certify that inpatient services furnished can reasonably be expected to improve the patient's condition.   Parks Ranger, DO 11/07/2021, 12:06 PM
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
## 2021-11-07 NOTE — BHH Suicide Risk Assessment (Signed)
[facility] INPATIENT:  Family/Significant Other Suicide Prevention Education  Suicide Prevention Education:  Education Completed; Abdulloh Ullom, son, [phone] has been identified by the patient as the family member/significant other with whom the patient will be residing, and identified as the person(s) who will aid the patient in the event of a mental health crisis (suicidal ideations/suicide attempt).  With written consent from the patient, the family member/significant other has been provided the following suicide prevention education, prior to the and/or following the discharge of the patient.  The suicide prevention education provided includes the following: Suicide risk factors Suicide prevention and interventions National Suicide Hotline telephone number [facility] assessment telephone number [facility] Emergency Assistance [place] and/or Residential Mobile Crisis Unit telephone number  Request made of family/significant other to: Remove weapons (e.g., guns, rifles, knives), all items previously/currently identified as safety concern.   Remove drugs/medications (over-the-counter, prescriptions, illicit drugs), all items previously/currently identified as a safety concern.  The family member/significant other verbalizes understanding of the suicide prevention education information provided.  The family member/significant other agrees to remove the items of safety concern listed above.  Son reports that patient has been in Assisted Living for the past year and a half.  He reports that the patient "has a hard time transitioning from being the primary caregiver".  He reports that the patient "has the ability to overlook details and lets things fester which results in a decrease in patience".  He reports that his mother responds well to the nursing staff at the Assisted Living.  He reports that his father (patient) has difficulty "transitioning from having to do  everything".  He reports that he is unsure if his father can return to [place].  He reports that the staff let him know that a certain diagnosis will make patient ineligible to return.  He is unsure of that diagnosis.  He further reports that patient can not return if deemed to be a danger to his self or others. He reports that the patient has has 9 strokes, with 2 being major and the others TIA.  He reports that the strokes have negative impacted the patients emotional state.  Rozann Lesches 11/07/2021, 1:07 PM

## 2021-11-07 NOTE — Plan of Care (Signed)
  Problem: Education: Goal: Knowledge of General Education information will improve Description: Including pain rating scale, medication(s)/side effects and non-pharmacologic comfort measures Outcome: Progressing   Problem: Health Behavior/Discharge Planning: Goal: Ability to manage health-related needs will improve Outcome: Progressing   Problem: Clinical Measurements: Goal: Ability to maintain clinical measurements within normal limits will improve Outcome: Progressing Goal: Cardiovascular complication will be avoided Outcome: Progressing   Problem: Activity: Goal: Risk for activity intolerance will decrease Outcome: Progressing   Problem: Nutrition: Goal: Adequate nutrition will be maintained Outcome: Progressing

## 2021-11-07 NOTE — BH IP Treatment Plan (Signed)
Interdisciplinary Treatment and Diagnostic Plan Update  11/07/2021 Time of Session: 10:15AM Steve Ramirez MRN: [id number]  Principal Diagnosis: Major depressive disorder, single episode, severe without psychosis ([name])  Secondary Diagnoses: Principal Problem:   Major depressive disorder, single episode, severe without psychosis ([name])   Current Medications:  Current Facility-Administered Medications  Medication Dose Route Frequency Provider Last Rate Last Admin   acetaminophen (TYLENOL) tablet 650 mg  650 mg Oral Q6H PRN Patrecia Pour, NP       alum & mag hydroxide-simeth (MAALOX/MYLANTA) 200-200-20 MG/5ML suspension 30 mL  30 mL Oral Q4H PRN Patrecia Pour, NP       amLODipine (NORVASC) tablet 10 mg  10 mg Oral Daily Patrecia Pour, NP   10 mg at 11/07/21 6962   aspirin EC tablet 81 mg  81 mg Oral BID Patrecia Pour, NP   81 mg at 11/07/21 0923   [START ON 11/09/2021] cloNIDine (CATAPRES - Dosed in mg/24 hr) patch 0.3 mg  0.3 mg Transdermal Weekly Parks Ranger, DO       clopidogrel (PLAVIX) tablet 75 mg  75 mg Oral Daily Patrecia Pour, NP   75 mg at 11/07/21 9528   cyanocobalamin (VITAMIN B12) tablet 1,000 mcg  1,000 mcg Oral Daily Patrecia Pour, NP   1,000 mcg at 11/07/21 4132   escitalopram (LEXAPRO) tablet 20 mg  20 mg Oral Daily Patrecia Pour, NP   20 mg at 11/07/21 4401   ferrous sulfate tablet 325 mg  325 mg Oral Q breakfast Patrecia Pour, NP   325 mg at 11/07/21 0810   isosorbide mononitrate (IMDUR) 24 hr tablet 30 mg  30 mg Oral QPM Patrecia Pour, NP   30 mg at 11/06/21 2131   levothyroxine (SYNTHROID) tablet 50 mcg  50 mcg Oral Daily Patrecia Pour, NP   50 mcg at 11/07/21 0655   lisinopril (ZESTRIL) tablet 40 mg  40 mg Oral Daily Patrecia Pour, NP   40 mg at 11/07/21 0272   magnesium hydroxide (MILK OF MAGNESIA) suspension 30 mL  30 mL Oral Daily PRN Patrecia Pour, NP       metFORMIN (GLUCOPHAGE) tablet 1,000 mg  1,000 mg Oral BID WC Patrecia Pour, NP   1,000 mg at 11/07/21 0810   metoprolol succinate (TOPROL-XL) 24 hr tablet 25 mg  25 mg Oral QPM Patrecia Pour, NP   25 mg at 11/06/21 2131   multivitamin with minerals tablet 1 tablet  1 tablet Oral Daily Patrecia Pour, NP   1 tablet at 11/07/21 5366   pravastatin (PRAVACHOL) tablet 20 mg  20 mg Oral QHS Patrecia Pour, NP   20 mg at 11/06/21 2131   rOPINIRole (REQUIP) tablet 1 mg  1 mg Oral QHS Patrecia Pour, NP   1 mg at 11/06/21 2131   spironolactone (ALDACTONE) tablet 25 mg  25 mg Oral Daily Patrecia Pour, NP   25 mg at 11/07/21 4403   PTA Medications: Medications Prior to Admission  Medication Sig Dispense Refill Last Dose   acetaminophen (TYLENOL) 325 MG tablet Take 2 tablets (650 mg total) by mouth every 4 (four) hours as needed for mild pain (or temp > 37.5 C (99.5 F)).      amLODipine (NORVASC) 10 MG tablet Take 1 tablet (10 mg total) by mouth daily.      aspirin EC 81 MG tablet Take 1 tablet (81 mg total) by  mouth 2 (two) times daily. For DVT prophylaxis for 30 days after surgery. 60 tablet 0    betamethasone dipropionate 0.05 % cream Apply 1 Application topically 2 (two) times daily as needed.      cetirizine (ZYRTEC) 10 MG tablet Take 10 mg by mouth daily.      cloNIDine (CATAPRES - DOSED IN MG/24 HR) 0.3 mg/24hr patch Place 1 patch (0.3 mg total) onto the skin once a week. (Patient taking differently: Place 0.3 mg onto the skin every Friday.) 4 patch 12    clopidogrel (PLAVIX) 75 MG tablet Take 1 tablet (75 mg total) by mouth daily. 90 tablet 0    escitalopram (LEXAPRO) 20 MG tablet Take 20 mg by mouth daily.      ferrous sulfate 325 (65 FE) MG tablet Take 325 mg by mouth daily with breakfast.      ipratropium-albuterol (DUONEB) 0.5-2.5 (3) MG/3ML SOLN Take 3 mLs by nebulization every 4 (four) hours as needed. 360 mL     isosorbide mononitrate (IMDUR) 30 MG 24 hr tablet Take 30 mg by mouth every evening.       levothyroxine (SYNTHROID, LEVOTHROID) 50 MCG  tablet Take 1 tablet (50 mcg total) by mouth daily. 90 tablet 0    lisinopril (PRINIVIL,ZESTRIL) 40 MG tablet Take 1 tablet (40 mg total) by mouth daily. 90 tablet 3    metFORMIN (GLUCOPHAGE) 1000 MG tablet Take 1,000 mg by mouth 2 (two) times daily.      metoprolol succinate (TOPROL-XL) 25 MG 24 hr tablet Take 25 mg by mouth every evening.       Multiple Vitamin (MULTIVITAMIN) tablet Take 1 tablet by mouth daily.        nitroGLYCERIN (NITROSTAT) 0.4 MG SL tablet Place 0.4 mg under the tongue every 5 (five) minutes as needed for chest pain.      Omega-3 Fatty Acids (FISH OIL) 1000 MG CAPS Take 1,000 mg by mouth at bedtime.      pravastatin (PRAVACHOL) 20 MG tablet Take 20 mg by mouth at bedtime.      rOPINIRole (REQUIP) 0.5 MG tablet Take 2 tablets (1 mg total) by mouth at bedtime. 60 tablet 0    spironolactone (ALDACTONE) 25 MG tablet Take 1 tablet (25 mg total) by mouth daily. 90 tablet 0    vitamin B-12 (CYANOCOBALAMIN) 1000 MCG tablet Take 1 tablet (1,000 mcg total) by mouth daily. 30 tablet 0     Patient Stressors: Other: Patient is having to care for his wife who has dementia    Patient Strengths: Marketing executive fund of knowledge  Supportive family/friends   Treatment Modalities: Medication Management, Group therapy, Case management,  1 to 1 session with clinician, Psychoeducation, Recreational therapy.   Physician Treatment Plan for Primary Diagnosis: Major depressive disorder, single episode, severe without psychosis ([name]) Long Term Goal(s): Improvement in symptoms so as ready for discharge   Short Term Goals: Ability to identify changes in lifestyle to reduce recurrence of condition will improve Ability to verbalize feelings will improve Ability to disclose and discuss suicidal ideas Ability to demonstrate self-control will improve Ability to identify and develop effective coping behaviors will improve Ability to maintain clinical measurements within normal  limits will improve Compliance with prescribed medications will improve Ability to identify triggers associated with substance abuse/mental health issues will improve  Medication Management: Evaluate patient's response, side effects, and tolerance of medication regimen.  Therapeutic Interventions: 1 to 1 sessions, Unit Group sessions and Medication administration.  Evaluation  of Outcomes: Not Met  Physician Treatment Plan for Secondary Diagnosis: Principal Problem:   Major depressive disorder, single episode, severe without psychosis ([place])  Long Term Goal(s): Improvement in symptoms so as ready for discharge   Short Term Goals: Ability to identify changes in lifestyle to reduce recurrence of condition will improve Ability to verbalize feelings will improve Ability to disclose and discuss suicidal ideas Ability to demonstrate self-control will improve Ability to identify and develop effective coping behaviors will improve Ability to maintain clinical measurements within normal limits will improve Compliance with prescribed medications will improve Ability to identify triggers associated with substance abuse/mental health issues will improve     Medication Management: Evaluate patient's response, side effects, and tolerance of medication regimen.  Therapeutic Interventions: 1 to 1 sessions, Unit Group sessions and Medication administration.  Evaluation of Outcomes: Not Met   RN Treatment Plan for Primary Diagnosis: Major depressive disorder, single episode, severe without psychosis ([place]) Long Term Goal(s): Knowledge of disease and therapeutic regimen to maintain health will improve  Short Term Goals: Ability to demonstrate self-control, Ability to participate in decision making will improve, Ability to verbalize feelings will improve, Ability to disclose and discuss suicidal ideas, Ability to identify and develop effective coping behaviors will improve, and Compliance with prescribed  medications will improve  Medication Management: RN will administer medications as ordered by provider, will assess and evaluate patient's response and provide education to patient for prescribed medication. RN will report any adverse and/or side effects to prescribing provider.  Therapeutic Interventions: 1 on 1 counseling sessions, Psychoeducation, Medication administration, Evaluate responses to treatment, Monitor vital signs and CBGs as ordered, Perform/monitor CIWA, COWS, AIMS and Fall Risk screenings as ordered, Perform wound care treatments as ordered.  Evaluation of Outcomes: Not Met   LCSW Treatment Plan for Primary Diagnosis: Major depressive disorder, single episode, severe without psychosis ([place]) Long Term Goal(s): Safe transition to appropriate next level of care at discharge, Engage patient in therapeutic group addressing interpersonal concerns.  Short Term Goals: Engage patient in aftercare planning with referrals and resources, Increase social support, Increase ability to appropriately verbalize feelings, Increase emotional regulation, Facilitate acceptance of mental health diagnosis and concerns, and Increase skills for wellness and recovery  Therapeutic Interventions: Assess for all discharge needs, 1 to 1 time with Social worker, Explore available resources and support systems, Assess for adequacy in community support network, Educate family and significant other(s) on suicide prevention, Complete Psychosocial Assessment, Interpersonal group therapy.  Evaluation of Outcomes: Not Met   Progress in Treatment: Attending groups: No. Participating in groups: No. Taking medication as prescribed: Yes. Toleration medication: Yes. Family/Significant other contact made: No, will contact:  once permission is given Patient understands diagnosis: Yes. Discussing patient identified problems/goals with staff: Yes. Medical problems stabilized or resolved: Yes. Denies suicidal/homicidal  ideation: Yes. Issues/concerns per patient self-inventory: No. Other: none  New problem(s) identified: No, Describe:  none  New Short Term/Long Term Goal(s): detox, elimination of symptoms of psychosis, medication management for mood stabilization; elimination of SI thoughts; development of comprehensive mental wellness plan.   Patient Goals:  "Not to do something stupid again."  Discharge Plan or Barriers: CSW to assist with development of appropriate discharge plans.    Reason for Continuation of Hospitalization: Anxiety Depression Medication stabilization Suicidal ideation  Estimated Length of Stay:  1-7 days  Last 3 [place] Suicide Severity Risk Score: Flowsheet Row Admission (Current) from 11/06/2021 in [facility] ED from 11/04/2021 in [facility]Y [facility]PART[facility] ED to Hosp-Admission (Discharged) from 12/01/2020 in [facility] (1A)  C-SSRS RISK CATEGORY No Risk High Risk No Risk       Last PHQ 2/9 Scores:    08/26/2017   11:08 AM 05/26/2017   11:25 AM 04/01/2017   11:10 AM  Depression screen PHQ 2/9  Decreased Interest _0 Down, Depressed, Hopeless _1 PHQ - 2 Score _2 Altered sleeping 0 1 1  Tired, decreased energy 0 1 0  Change in appetite 0 2 0  Feeling bad or failure about yourself  0 0 1  Trouble concentrating 0 1 0  Moving slowly or fidgety/restless 2 1 0  Suicidal thoughts 0 0 0  PHQ-9 Score _3 Difficult doing work/chores Not difficult at all Somewhat difficult Somewhat difficult    Scribe for Treatment Team: Rozann Lesches, Marlinda Mike 11/07/2021 12:38 PM

## 2021-11-07 NOTE — H&P (Signed)
Psychiatric Admission Assessment Adult  Patient Identification: Steve Ramirez MRN:  [id number] Date of Evaluation:  11/07/2021 Chief Complaint:  Major depressive disorder, single episode, severe without psychosis ([place]) [F32.2] Principal Diagnosis: Major depressive disorder, single episode, severe without psychosis ([place]) Diagnosis:  Principal Problem:   Major depressive disorder, single episode, severe without psychosis ([place])  History of Present Illness: Steve Ramirez is a 86 y.o. male patient presented to [facility] via EMS from [facility] facility under involuntary commitment status (IVC). The patient reports severe depression and reports he took a handful of medication, which he estimates 75 tablets of 200 mg ibuprofen at 2100. The patient shared that he lives at the facility with his wife, who has dementia. He shared that the disease has worsened in the past six months. He and his wife have been together for 31 years, and his wife's illness has taken a toll on him. He shared that even though he and his wife reside in a nursing facility, his wife refused to let the nursing staff assist her. He said his wife does not listen to him when he tries to help her. The patient shared that he feels overwhelmed and exhausted. The patient stated, "I should know better. 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 I have had 16 strokes." The patient shared he became depressed, and he was not thinking, and intentionally ingested 75 ibuprofen tablets.   Associated Signs/Symptoms: Depression Symptoms:  suicidal attempt, Duration of Depression Symptoms: 2 years (Hypo) Manic Symptoms:  None Anxiety Symptoms:  Excessive Worry, Psychotic Symptoms:  None PTSD Symptoms: NA Total Time spent with patient: 1 hour  Past Psychiatric History: None  Is the patient at risk to self? Yes.    Has the patient been a risk to  self in the past 6 months? Yes.    Has the patient been a risk to self within the distant past? No.  Is the patient a risk to others? No.  Has the patient been a risk to others in the past 6 months? No.  Has the patient been a risk to others within the distant past? No.   [place] Scale:  [facility] Admission (Current) from 11/06/2021 in [facility] ED from 11/04/2021 in [facility] ED to Hosp-Admission (Discharged) from 12/01/2020 in [facility] [place] (1A)  C-SSRS RISK CATEGORY No Risk High Risk No Risk        Prior Inpatient Therapy:   Prior Outpatient Therapy:    Alcohol Screening: 1. How often do you have a drink containing alcohol?: Monthly or less 2. How many drinks containing alcohol do you have on a typical day when you are drinking?: 1 or 2 3. How often do you have six or more drinks on one occasion?: Never AUDIT-C Score: 1 4. How often during the last year have you found that you were not able to stop drinking once you had started?: Never 5. How often during the last year have you failed to do what was normally expected from you because of drinking?: Never 6. How often during the last year have you needed a first drink in the morning to get yourself going after a heavy drinking session?: Never 7. How often during the last year have you had a feeling of guilt of remorse after drinking?: Never 8. How often during the last year have  you been unable to remember what happened the night before because you had been drinking?: Never 9. Have you or someone else been injured as a result of your drinking?: No 10. Has a relative or friend or a doctor or another health worker been concerned about your drinking or suggested you cut down?: No Alcohol Use Disorder Identification Test Final Score (AUDIT): 1 Alcohol Brief Interventions/Follow-up: Patient Refused Substance Abuse History in the last 12  months:  No. Consequences of Substance Abuse: NA Previous Psychotropic Medications: No  Psychological Evaluations: No  Past Medical History:  Past Medical History:  Diagnosis Date   Coronary atherosclerosis of native coronary artery    Essential hypertension, benign    GIB (gastrointestinal bleeding)    Colonic diverticular bleed   History of melanoma    History of TIAs    Stroke ([place])    Type 2 diabetes mellitus ([place])     Past Surgical History:  Procedure Laterality Date   COLONOSCOPY  08/13/2010   Procedure: COLONOSCOPY;  Surgeon: Rogene Houston, MD;  Location: [facility];  Service: Endoscopy;  Laterality: N/A;   JOINT REPLACEMENT     KNEE SURGERY     arthroscopic   LEFT HEART CATHETERIZATION WITH CORONARY ANGIOGRAM N/A 11/24/2010   Procedure: LEFT HEART CATHETERIZATION WITH CORONARY ANGIOGRAM;  Surgeon: Josue Hector, MD;  Location: [facility] CATH LAB;  Service: Cardiovascular;  Laterality: N/A;   neck fusion     years ago   TOTAL HIP ARTHROPLASTY Right 08/02/2018   Procedure: TOTAL HIP ARTHROPLASTY ANTERIOR APPROACH;  Surgeon: Renette Butters, MD;  Location: [facility] ORS;  Service: Orthopedics;  Laterality: Right;   TOTAL HIP ARTHROPLASTY Left 10/18/2018   Procedure: TOTAL HIP ARTHROPLASTY ANTERIOR APPROACH;  Surgeon: Renette Butters, MD;  Location: [facility] ORS;  Service: Orthopedics;  Laterality: Left;   Family History:  Family History  Problem Relation Age of Onset   Coronary artery disease Father    Asthma Other    Emphysema Mother    Coronary artery disease Paternal Uncle    Brain cancer Brother    Family Psychiatric  History: Daughter with Bipolar Disorder Tobacco Screening:   Social History:  Social History   Substance and Sexual Activity  Alcohol Use Yes   Comment: occasionally     Social History   Substance and Sexual Activity  Drug Use No    Additional Social History: Marital status: Married Number of Years Married: 32 What types of issues is patient dealing  with in the relationship?: Patient reports that wife has been dealing with dementia for the past 5 years. Additional relationship information: "we've been together a total of 58 years, we've never had an argument" Does patient have children?: Yes How many children?: 3 How is patient's relationship with their children?: "very good"                         Allergies:  No Known Allergies Lab Results: No results found for this or any previous visit (from the past 48 hour(s)).  Blood Alcohol level:  Lab Results  Component Value Date   ETH <10 11/04/2021   ETH <10 35/32/9924    Metabolic Disorder Labs:  Lab Results  Component Value Date   HGBA1C 6.5 (H) 11/10/2020   MPG 139.85 11/10/2020   MPG 143 10/04/2020   No results found for: "PROLACTIN" Lab Results  Component Value Date   CHOL 82 11/10/2020   TRIG 113 11/10/2020  HDL 29 (L) 11/10/2020   CHOLHDL 2.8 11/10/2020   VLDL 23 11/10/2020   LDLCALC 30 11/10/2020   LDLCALC 61 10/04/2020    Current Medications: Current Facility-Administered Medications  Medication Dose Route Frequency Provider Last Rate Last Admin   acetaminophen (TYLENOL) tablet 650 mg  650 mg Oral Q6H PRN Patrecia Pour, NP       alum & mag hydroxide-simeth (MAALOX/MYLANTA) 200-200-20 MG/5ML suspension 30 mL  30 mL Oral Q4H PRN Patrecia Pour, NP       amLODipine (NORVASC) tablet 10 mg  10 mg Oral Daily Patrecia Pour, NP   10 mg at 11/07/21 2119   aspirin EC tablet 81 mg  81 mg Oral BID Patrecia Pour, NP   81 mg at 11/07/21 0923   [START ON 11/09/2021] cloNIDine (CATAPRES - Dosed in mg/24 hr) patch 0.3 mg  0.3 mg Transdermal Weekly Parks Ranger, DO       clopidogrel (PLAVIX) tablet 75 mg  75 mg Oral Daily Patrecia Pour, NP   75 mg at 11/07/21 4174   cyanocobalamin (VITAMIN B12) tablet 1,000 mcg  1,000 mcg Oral Daily Patrecia Pour, NP   1,000 mcg at 11/07/21 0814   escitalopram (LEXAPRO) tablet 20 mg  20 mg Oral Daily Patrecia Pour, NP   20 mg at 11/07/21 4818   ferrous sulfate tablet 325 mg  325 mg Oral Q breakfast Patrecia Pour, NP   325 mg at 11/07/21 0810   isosorbide mononitrate (IMDUR) 24 hr tablet 30 mg  30 mg Oral QPM Patrecia Pour, NP   30 mg at 11/06/21 2131   levothyroxine (SYNTHROID) tablet 50 mcg  50 mcg Oral Daily Patrecia Pour, NP   50 mcg at 11/07/21 0655   lisinopril (ZESTRIL) tablet 40 mg  40 mg Oral Daily Patrecia Pour, NP   40 mg at 11/07/21 5631   magnesium hydroxide (MILK OF MAGNESIA) suspension 30 mL  30 mL Oral Daily PRN Patrecia Pour, NP       metFORMIN (GLUCOPHAGE) tablet 1,000 mg  1,000 mg Oral BID WC Patrecia Pour, NP   1,000 mg at 11/07/21 0810   metoprolol succinate (TOPROL-XL) 24 hr tablet 25 mg  25 mg Oral QPM Patrecia Pour, NP   25 mg at 11/06/21 2131   multivitamin with minerals tablet 1 tablet  1 tablet Oral Daily Patrecia Pour, NP   1 tablet at 11/07/21 4970   pravastatin (PRAVACHOL) tablet 20 mg  20 mg Oral QHS Patrecia Pour, NP   20 mg at 11/06/21 2131   rOPINIRole (REQUIP) tablet 1 mg  1 mg Oral QHS Patrecia Pour, NP   1 mg at 11/06/21 2131   spironolactone (ALDACTONE) tablet 25 mg  25 mg Oral Daily Patrecia Pour, NP   25 mg at 11/07/21 2637   PTA Medications: Medications Prior to Admission  Medication Sig Dispense Refill Last Dose   acetaminophen (TYLENOL) 325 MG tablet Take 2 tablets (650 mg total) by mouth every 4 (four) hours as needed for mild pain (or temp > 37.5 C (99.5 F)).      amLODipine (NORVASC) 10 MG tablet Take 1 tablet (10 mg total) by mouth daily.      aspirin EC 81 MG tablet Take 1 tablet (81 mg total) by mouth 2 (two) times daily. For DVT prophylaxis for 30 days after surgery. 60 tablet 0    betamethasone dipropionate  0.05 % cream Apply 1 Application topically 2 (two) times daily as needed.      cetirizine (ZYRTEC) 10 MG tablet Take 10 mg by mouth daily.      cloNIDine (CATAPRES - DOSED IN MG/24 HR) 0.3 mg/24hr patch Place 1 patch (0.3 mg  total) onto the skin once a week. (Patient taking differently: Place 0.3 mg onto the skin every Friday.) 4 patch 12    clopidogrel (PLAVIX) 75 MG tablet Take 1 tablet (75 mg total) by mouth daily. 90 tablet 0    escitalopram (LEXAPRO) 20 MG tablet Take 20 mg by mouth daily.      ferrous sulfate 325 (65 FE) MG tablet Take 325 mg by mouth daily with breakfast.      ipratropium-albuterol (DUONEB) 0.5-2.5 (3) MG/3ML SOLN Take 3 mLs by nebulization every 4 (four) hours as needed. 360 mL     isosorbide mononitrate (IMDUR) 30 MG 24 hr tablet Take 30 mg by mouth every evening.       levothyroxine (SYNTHROID, LEVOTHROID) 50 MCG tablet Take 1 tablet (50 mcg total) by mouth daily. 90 tablet 0    lisinopril (PRINIVIL,ZESTRIL) 40 MG tablet Take 1 tablet (40 mg total) by mouth daily. 90 tablet 3    metFORMIN (GLUCOPHAGE) 1000 MG tablet Take 1,000 mg by mouth 2 (two) times daily.      metoprolol succinate (TOPROL-XL) 25 MG 24 hr tablet Take 25 mg by mouth every evening.       Multiple Vitamin (MULTIVITAMIN) tablet Take 1 tablet by mouth daily.        nitroGLYCERIN (NITROSTAT) 0.4 MG SL tablet Place 0.4 mg under the tongue every 5 (five) minutes as needed for chest pain.      Omega-3 Fatty Acids (FISH OIL) 1000 MG CAPS Take 1,000 mg by mouth at bedtime.      pravastatin (PRAVACHOL) 20 MG tablet Take 20 mg by mouth at bedtime.      rOPINIRole (REQUIP) 0.5 MG tablet Take 2 tablets (1 mg total) by mouth at bedtime. 60 tablet 0    spironolactone (ALDACTONE) 25 MG tablet Take 1 tablet (25 mg total) by mouth daily. 90 tablet 0    vitamin B-12 (CYANOCOBALAMIN) 1000 MCG tablet Take 1 tablet (1,000 mcg total) by mouth daily. 30 tablet 0     Musculoskeletal: Strength & Muscle Tone: within normal limits Gait & Station: normal Patient leans: N/A            Psychiatric Specialty Exam:  Presentation  General Appearance:  Appropriate for Environment  Eye Contact: Fair  Speech: Clear and  Coherent  Speech Volume: Normal  Handedness: Right   Mood and Affect  Mood: Depressed; Hopeless  Affect: Blunt; Congruent   Thought Process  Thought Processes: Coherent  Duration of Psychotic Symptoms: No data recorded Past Diagnosis of Schizophrenia or Psychoactive disorder: No data recorded Descriptions of Associations:Intact  Orientation:Full (Time, Place and Person)  Thought Content:WDL; Rumination  Hallucinations:No data recorded Ideas of Reference:None  Suicidal Thoughts:No data recorded Homicidal Thoughts:No data recorded  Sensorium  Memory: Immediate Good; Recent Good  Judgment: Poor  Insight: Fair   Community education officer  Concentration: Good  Attention Span: Good  Recall: Good  Fund of Knowledge: Good  Language: Fair   Psychomotor Activity  Psychomotor Activity:No data recorded  Assets  Assets: Communication Skills; Desire for Improvement; Financial Resources/Insurance; Housing; Social Support; Resilience   Sleep  Sleep:No data recorded   Physical Exam: Physical Exam Vitals and nursing note reviewed.  Constitutional:      Appearance: Normal appearance. He is normal weight.  HENT:     Head: Normocephalic and atraumatic.     Nose: Nose normal.     Mouth/Throat:     Pharynx: Oropharynx is clear.  Eyes:     Extraocular Movements: Extraocular movements intact.     Pupils: Pupils are equal, round, and reactive to light.  Cardiovascular:     Rate and Rhythm: Normal rate and regular rhythm.     Pulses: Normal pulses.     Heart sounds: Normal heart sounds.  Pulmonary:     Effort: Pulmonary effort is normal.     Breath sounds: Normal breath sounds.  Abdominal:     General: Abdomen is flat. Bowel sounds are normal.     Palpations: Abdomen is soft.  Musculoskeletal:        General: Normal range of motion.     Cervical back: Normal range of motion and neck supple.  Skin:    General: Skin is warm and dry.  Neurological:      General: No focal deficit present.     Mental Status: He is alert and oriented to person, place, and time.  Psychiatric:        Attention and Perception: Attention and perception normal.        Mood and Affect: Affect normal. Mood is depressed.        Speech: Speech normal.        Behavior: Behavior normal. Behavior is cooperative.        Thought Content: Thought content normal.        Cognition and Memory: Cognition and memory normal.        Judgment: Judgment is impulsive.    Review of Systems  Constitutional: Negative.   HENT: Negative.    Eyes: Negative.   Respiratory: Negative.    Cardiovascular: Negative.   Gastrointestinal: Negative.   Genitourinary: Negative.   Musculoskeletal: Negative.   Skin: Negative.   Neurological: Negative.   Endo/Heme/Allergies: Negative.   Psychiatric/Behavioral:  Positive for depression.    Blood pressure (!) 149/70, pulse 68, temperature (!) 97.4 F (36.3 C), temperature source Oral, resp. rate 18, height '5\' 8"'$  (1.727 m), weight 82.3 kg, SpO2 96 %. Body mass index is 27.6 kg/m.  Treatment Plan Summary: Daily contact with patient to assess and evaluate symptoms and progress in treatment, Medication management, and Plan see orders.  Observation Level/Precautions:  15 minute checks  Laboratory:  CBC Chemistry Profile  Psychotherapy:    Medications:    Consultations:    Discharge Concerns:    Estimated LOS:  Other:     Physician Treatment Plan for Primary Diagnosis: Major depressive disorder, single episode, severe without psychosis ([place]) Long Term Goal(s): Improvement in symptoms so as ready for discharge  Short Term Goals: Ability to identify changes in lifestyle to reduce recurrence of condition will improve, Ability to verbalize feelings will improve, Ability to disclose and discuss suicidal ideas, Ability to demonstrate self-control will improve, Ability to identify and develop effective coping behaviors will improve, Ability to  maintain clinical measurements within normal limits will improve, Compliance with prescribed medications will improve, and Ability to identify triggers associated with substance abuse/mental health issues will improve  Physician Treatment Plan for Secondary Diagnosis: Principal Problem:   Major depressive disorder, single episode, severe without psychosis ([place])    I certify that inpatient services furnished can reasonably be expected to improve the patient's condition.    Yisroel Mullendore [employer],  DO 10/27/202312:18 PM

## 2021-11-07 NOTE — Group Note (Signed)
[facility] LCSW Group Therapy Note   Group Date: 11/07/2021 Start Time: 1400 End Time: 1500   Type of Therapy/Topic:  Group Therapy:  Emotion Regulation  Participation Level:  Did Not Attend   Mood:  Description of Group:    The purpose of this group is to assist patients in learning to regulate negative emotions and experience positive emotions. Patients will be guided to discuss ways in which they have been vulnerable to their negative emotions. These vulnerabilities will be juxtaposed with experiences of positive emotions or situations, and patients challenged to use positive emotions to combat negative ones. Special emphasis will be placed on coping with negative emotions in conflict situations, and patients will process healthy conflict resolution skills.  Therapeutic Goals: Patient will identify two positive emotions or experiences to reflect on in order to balance out negative emotions:  Patient will label two or more emotions that they find the most difficult to experience:  Patient will be able to demonstrate positive conflict resolution skills through discussion or role plays:   Summary of Patient Progress: Patient declined to attend group, though personally invited by this clinician.  Therapeutic Modalities:   Cognitive Behavioral Therapy Feelings Identification Dialectical Behavioral Therapy   Rozann Lesches, LCSW

## 2021-11-08 DIAGNOSIS — F322 Major depressive disorder, single episode, severe without psychotic features: Secondary | ICD-10-CM | POA: Diagnosis not present

## 2021-11-08 MED ORDER — RISPERIDONE 1 MG PO TABS
0.5000 mg | ORAL_TABLET | ORAL | Status: DC
  Administered 2021-11-08 – 2021-11-14 (×13): 0.5 mg via ORAL
  Filled 2021-11-08 (×13): qty 1

## 2021-11-08 NOTE — Plan of Care (Signed)
  Problem: Clinical Measurements: Goal: Ability to maintain clinical measurements within normal limits will improve Outcome: Progressing Goal: Will remain free from infection Outcome: Progressing Goal: Cardiovascular complication will be avoided Outcome: Progressing   Problem: Activity: Goal: Risk for activity intolerance will decrease Outcome: Progressing   Problem: Nutrition: Goal: Adequate nutrition will be maintained Outcome: Progressing

## 2021-11-08 NOTE — Progress Notes (Signed)
Patient is A+O x 4. He endorses moderate level of depression but denies SI/HI/AVH, and anxiety. Pain 0/10. Appetite fair. New Risperidone medication adm today. Patient states that he has his reservations about starting a psych med but maintains compliance. Q15 minute unit checks in place.

## 2021-11-08 NOTE — Progress Notes (Signed)
[facility] MD Progress Note  11/08/2021 12:23 PM KOWEN KLUTH  MRN:  [id number] Subjective: Rush Landmark is seen on rounds.  He seems to be feeling sorry for himself and depressed more because his wife he says is doing better without him.  I discussed starting different medications for his depression and obsessions over his wife.  He agreed with stated that he did not think it would help.  He denies any suicidal ideation.  Principal Problem: Major depressive disorder, single episode, severe without psychosis ([place]) Diagnosis: Principal Problem:   Major depressive disorder, single episode, severe without psychosis ([place])  Total Time spent with patient: 15 minutes  Past Psychiatric History: None  Past Medical History:  Past Medical History:  Diagnosis Date   Coronary atherosclerosis of native coronary artery    Essential hypertension, benign    GIB (gastrointestinal bleeding)    Colonic diverticular bleed   History of melanoma    History of TIAs    Stroke ([place])    Type 2 diabetes mellitus ([place])     Past Surgical History:  Procedure Laterality Date   COLONOSCOPY  08/13/2010   Procedure: COLONOSCOPY;  Surgeon: Rogene Houston, MD;  [facility];  Service: Endoscopy;  Laterality: N/A;   JOINT REPLACEMENT     KNEE SURGERY     arthroscopic   LEFT HEART CATHETERIZATION WITH CORONARY ANGIOGRAM N/A 11/24/2010   Procedure: LEFT HEART CATHETERIZATION WITH CORONARY ANGIOGRAM;  Surgeon: Josue Hector, MD;  Location: [facility] CATH LAB;  Service: Cardiovascular;  Laterality: N/A;   neck fusion     years ago   TOTAL HIP ARTHROPLASTY Right 08/02/2018   Procedure: TOTAL HIP ARTHROPLASTY ANTERIOR APPROACH;  Surgeon: Renette Butters, MD;  Location: [facility] ORS;  Service: Orthopedics;  Laterality: Right;   TOTAL HIP ARTHROPLASTY Left 10/18/2018   Procedure: TOTAL HIP ARTHROPLASTY ANTERIOR APPROACH;  Surgeon: Renette Butters, MD;  Location: [facility] ORS;  Service: Orthopedics;  Laterality: Left;   Family  History:  Family History  Problem Relation Age of Onset   Coronary artery disease Father    Asthma Other    Emphysema Mother    Coronary artery disease Paternal Uncle    Brain cancer Brother    Social History:  Social History   Substance and Sexual Activity  Alcohol Use Yes   Comment: occasionally     Social History   Substance and Sexual Activity  Drug Use No    Social History   Socioeconomic History   Marital status: Married    Spouse name: Not on file   Number of children: Not on file   Years of education: Not on file   Highest education level: Not on file  Occupational History   Occupation: retired  Tobacco Use   Smoking status: Former    Types: Cigarettes    Quit date: 01/13/1980    Years since quitting: 41.8   Smokeless tobacco: Never  Vaping Use   Vaping Use: Never used  Substance and Sexual Activity   Alcohol use: Yes    Comment: occasionally   Drug use: No   Sexual activity: Not Currently    Partners: Female  Other Topics Concern   Not on file  Social History Narrative   Lives with wife. Grew up in [place] and after college moved to [place]. Got married. Moved to [place] and then to [place], [place].    Attends [employer].    Masters health education, administration   3 children  Caffeine- 1 pot coffee daily.   Daughter is a Education officer, museum. Son is a Higher education careers adviser. Son is in [employer].       Social Determinants of Health   Financial Resource Strain: Not on file  Food Insecurity: No Food Insecurity (11/06/2021)   Hunger Vital Sign    Worried About Running Out of Food in the Last Year: Never true    Ran Out of Food in the Last Year: Never true  Transportation Needs: No Transportation Needs (11/06/2021)   PRAPARE - Hydrologist (Medical): No    Lack of Transportation (Non-Medical): No  Physical Activity: Not on file  Stress: Not on file  Social Connections: Not on file   Additional Social  History:                         Sleep: Good  Appetite:  Good  Current Medications: Current Facility-Administered Medications  Medication Dose Route Frequency Provider Last Rate Last Admin   acetaminophen (TYLENOL) tablet 650 mg  650 mg Oral Q6H PRN Patrecia Pour, NP       alum & mag hydroxide-simeth (MAALOX/MYLANTA) 200-200-20 MG/5ML suspension 30 mL  30 mL Oral Q4H PRN Patrecia Pour, NP       amLODipine (NORVASC) tablet 10 mg  10 mg Oral Daily Patrecia Pour, NP   10 mg at 11/08/21 6283   aspirin EC tablet 81 mg  81 mg Oral BID Patrecia Pour, NP   81 mg at 11/08/21 0954   [START ON 11/09/2021] cloNIDine (CATAPRES - Dosed in mg/24 hr) patch 0.3 mg  0.3 mg Transdermal Weekly Parks Ranger, DO       clopidogrel (PLAVIX) tablet 75 mg  75 mg Oral Daily Patrecia Pour, NP   75 mg at 11/08/21 6629   cyanocobalamin (VITAMIN B12) tablet 1,000 mcg  1,000 mcg Oral Daily Patrecia Pour, NP   1,000 mcg at 11/08/21 4765   escitalopram (LEXAPRO) tablet 20 mg  20 mg Oral Daily Patrecia Pour, NP   20 mg at 11/08/21 4650   ferrous sulfate tablet 325 mg  325 mg Oral Q breakfast Patrecia Pour, NP   325 mg at 11/08/21 3546   isosorbide mononitrate (IMDUR) 24 hr tablet 30 mg  30 mg Oral QPM Patrecia Pour, NP   30 mg at 11/07/21 1732   levothyroxine (SYNTHROID) tablet 50 mcg  50 mcg Oral Daily Patrecia Pour, NP   50 mcg at 11/07/21 0655   lisinopril (ZESTRIL) tablet 40 mg  40 mg Oral Daily Patrecia Pour, NP   40 mg at 11/08/21 5681   magnesium hydroxide (MILK OF MAGNESIA) suspension 30 mL  30 mL Oral Daily PRN Patrecia Pour, NP       metFORMIN (GLUCOPHAGE) tablet 1,000 mg  1,000 mg Oral BID WC Patrecia Pour, NP   1,000 mg at 11/08/21 0953   metoprolol succinate (TOPROL-XL) 24 hr tablet 25 mg  25 mg Oral QPM Patrecia Pour, NP   25 mg at 11/07/21 1732   multivitamin with minerals tablet 1 tablet  1 tablet Oral Daily Patrecia Pour, NP   1 tablet at 11/08/21 0954    pravastatin (PRAVACHOL) tablet 20 mg  20 mg Oral QHS Patrecia Pour, NP   20 mg at 11/07/21 2118   risperiDONE (RISPERDAL) tablet 0.5 mg  0.5 mg Oral [id number] Caren Griffins  Edward, DO   0.5 mg at 11/08/21 1211   rOPINIRole (REQUIP) tablet 1 mg  1 mg Oral QHS Patrecia Pour, NP   1 mg at 11/07/21 2118   spironolactone (ALDACTONE) tablet 25 mg  25 mg Oral Daily Patrecia Pour, NP   25 mg at 11/08/21 1610    Lab Results: No results found for this or any previous visit (from the past 48 hour(s)).  Blood Alcohol level:  Lab Results  Component Value Date   ETH <10 11/04/2021   ETH <10 96/04/5407    Metabolic Disorder Labs: Lab Results  Component Value Date   HGBA1C 6.5 (H) 11/10/2020   MPG 139.85 11/10/2020   MPG 143 10/04/2020   No results found for: "PROLACTIN" Lab Results  Component Value Date   CHOL 82 11/10/2020   TRIG 113 11/10/2020   HDL 29 (L) 11/10/2020   CHOLHDL 2.8 11/10/2020   VLDL 23 11/10/2020   LDLCALC 30 11/10/2020   LDLCALC 61 10/04/2020    Physical Findings: AIMS:  , ,  ,  ,    CIWA:    COWS:     Musculoskeletal: Strength & Muscle Tone: within normal limits Gait & Station: normal Patient leans: N/A  Psychiatric Specialty Exam:  Presentation  General Appearance:  Appropriate for Environment  Eye Contact: Fair  Speech: Clear and Coherent  Speech Volume: Normal  Handedness: Right   Mood and Affect  Mood: Depressed; Hopeless  Affect: Blunt; Congruent   Thought Process  Thought Processes: Coherent  Descriptions of Associations:Intact  Orientation:Full (Time, Place and Person)  Thought Content:WDL; Rumination  History of Schizophrenia/Schizoaffective disorder:No data recorded Duration of Psychotic Symptoms:No data recorded Hallucinations:No data recorded Ideas of Reference:None  Suicidal Thoughts:No data recorded Homicidal Thoughts:No data recorded  Sensorium  Memory: Immediate Good; Recent  Good  Judgment: Poor  Insight: Fair   Community education officer  Concentration: Good  Attention Span: Good  Recall: Good  Fund of Knowledge: Good  Language: Fair   Psychomotor Activity  Psychomotor Activity:No data recorded  Assets  Assets: Communication Skills; Desire for Improvement; Financial Resources/Insurance; Housing; Social Support; Resilience   Sleep  Sleep:No data recorded   Blood pressure (!) 146/78, pulse 72, temperature 98.3 F (36.8 C), resp. rate 18, height '5\' 8"'$  (1.727 m), weight 82.3 kg, SpO2 98 %. Body mass index is 27.6 kg/m.   Treatment Plan Summary: Daily contact with patient to assess and evaluate symptoms and progress in treatment, Medication management, and Plan start Risperdal 0.5 mg twice a day.  Parks Ranger, DO 11/08/2021, 12:23 PM

## 2021-11-08 NOTE — Progress Notes (Signed)
Patient is alert and oriented times 4. Mood and affect appropriate. Patient rates pain as 0/10. He denies SI, HI, and AVH. Patient does endorse feelings of depression at this time. Patient stated that he feels like he is not able to care for his wife like she needs to be cared for and feels overwhelmed at times.  Patient did seem to enjoy the visit from his son and was cheerful after the visit.  Patient states he slept well last night. Evening medicines administered whole by mouth without difficulty. Patient ate snack in day room; appetite was fair. Patient remains on unit with Q15 minute checks in place.

## 2021-11-09 DIAGNOSIS — F322 Major depressive disorder, single episode, severe without psychotic features: Secondary | ICD-10-CM | POA: Diagnosis not present

## 2021-11-09 NOTE — Group Note (Signed)
LCSW Group Therapy Note   Group Date: 11/09/2021 Start Time: 1400 End Time: 1500   Type of Therapy and Topic:  Group Therapy: Boundaries  Participation Level:  Did Not Attend  Description of Group: This group will address the use of boundaries in their personal lives. Patients will explore why boundaries are important, the difference between healthy and unhealthy boundaries, and negative and postive outcomes of different boundaries and will look at how boundaries can be crossed.  Patients will be encouraged to identify current boundaries in their own lives and identify what kind of boundary is being set. Facilitators will guide patients in utilizing problem-solving interventions to address and correct types boundaries being used and to address when no boundary is being used. Understanding and applying boundaries will be explored and addressed for obtaining and maintaining a balanced life. Patients will be encouraged to explore ways to assertively make their boundaries and needs known to significant others in their lives, using other group members and facilitator for role play, support, and feedback.  Therapeutic Goals:  1.  Patient will identify areas in their life where setting clear boundaries could be  used to improve their life.  2.  Patient will identify signs/triggers that a boundary is not being respected. 3.  Patient will identify two ways to set boundaries in order to achieve balance in  their lives: 4.  Patient will demonstrate ability to communicate their needs and set boundaries  through discussion and/or role plays  Summary of Patient Progress:   Patient declined to attend group, stating "if it can teach me how to be happily married for 56 years then I will attend".  CSW asked for clarification to the patients statement and patient reported feelings of frustration at the physician after a conversation that patient reports not feeling heard and offended.  Therapeutic Modalities:    Cognitive Behavioral Therapy Solution-Focused Therapy  Rozann Lesches, LCSWA 11/09/2021  3:08 PM

## 2021-11-09 NOTE — Progress Notes (Signed)
D: Pt alert and oriented. Pt reports experiencing mild depression w/o anxiety. Pt denies experiencing any pain. Pt denies experiencing any SI/HI, or AVH, is agreeable to notifying staff with any safety concerns.    A: Scheduled medications administered, support and encouragement provided. Routine safety checks conducted q15 minutes.   R: No adverse drug reactions noted. Pt complaint with medications. Pt interacts minimally with others on the unit. Pt remains safe at this time. Will continue plan of care.

## 2021-11-09 NOTE — Progress Notes (Signed)
Patient is a A+O x 4. He denies anxiety, SI,HI,and AVH. Mild depression and sadness endorsed. Patient encouraged to interact more with staff and other patients. Therapeutic communication conducted. Pain 0/10. Good appetite. Patient is calm, cooperative, and medication compliant. Q15 minute unit checks in place.

## 2021-11-09 NOTE — Progress Notes (Signed)
[facility] MD Progress Note  11/09/2021 12:01 PM Steve Ramirez  MRN:  [id number] Subjective: Steve Ramirez is seen on rounds.  He is still feeling depressed because his wife is doing well without him.  He has been compliant with medications.  No side effects.  Mood and affect are stable.  No suicidal ideation.  Principal Problem: Major depressive disorder, single episode, severe without psychosis ([place]) Diagnosis: Principal Problem:   Major depressive disorder, single episode, severe without psychosis ([place])  Total Time spent with patient: 15 minutes  Past Psychiatric History: None  Past Medical History:  Past Medical History:  Diagnosis Date   Coronary atherosclerosis of native coronary artery    Essential hypertension, benign    GIB (gastrointestinal bleeding)    Colonic diverticular bleed   History of melanoma    History of TIAs    Stroke ([place])    Type 2 diabetes mellitus ([place])     Past Surgical History:  Procedure Laterality Date   COLONOSCOPY  08/13/2010   Procedure: COLONOSCOPY;  Surgeon: Rogene Houston, MD;  [facility];  Service: Endoscopy;  Laterality: N/A;   JOINT REPLACEMENT     KNEE SURGERY     arthroscopic   LEFT HEART CATHETERIZATION WITH CORONARY ANGIOGRAM N/A 11/24/2010   Procedure: LEFT HEART CATHETERIZATION WITH CORONARY ANGIOGRAM;  Surgeon: Josue Hector, MD;  Location: [facility] CATH LAB;  Service: Cardiovascular;  Laterality: N/A;   neck fusion     years ago   TOTAL HIP ARTHROPLASTY Right 08/02/2018   Procedure: TOTAL HIP ARTHROPLASTY ANTERIOR APPROACH;  Surgeon: Renette Butters, MD;  Location: [facility] ORS;  Service: Orthopedics;  Laterality: Right;   TOTAL HIP ARTHROPLASTY Left 10/18/2018   Procedure: TOTAL HIP ARTHROPLASTY ANTERIOR APPROACH;  Surgeon: Renette Butters, MD;  Location: [facility]S;  Service: Orthopedics;  Laterality: Left;   Family History:  Family History  Problem Relation Age of Onset   Coronary artery disease Father    Asthma Other    Emphysema  Mother    Coronary artery disease Paternal Uncle    Brain cancer Brother     Social History:  Social History   Substance and Sexual Activity  Alcohol Use Yes   Comment: occasionally     Social History   Substance and Sexual Activity  Drug Use No    Social History   Socioeconomic History   Marital status: Married    Spouse name: Not on file   Number of children: Not on file   Years of education: Not on file   Highest education level: Not on file  Occupational History   Occupation: retired  Tobacco Use   Smoking status: Former    Types: Cigarettes    Quit date: 01/13/1980    Years since quitting: 41.8   Smokeless tobacco: Never  Vaping Use   Vaping Use: Never used  Substance and Sexual Activity   Alcohol use: Yes    Comment: occasionally   Drug use: No   Sexual activity: Not Currently    Partners: Female  Other Topics Concern   Not on file  Social History Narrative   Lives with wife. Grew up in [place] and after college moved to [place]. Got married. Moved to [place] and then to [place], [place].    Attends [employer].    Masters health education, administration   3 children   Caffeine- 1 pot coffee daily.   Daughter is a Education officer, museum. Son is a Higher education careers adviser. Son is in  [employer].       Social Determinants of Health   Financial Resource Strain: Not on file  Food Insecurity: No Food Insecurity (11/06/2021)   Hunger Vital Sign    Worried About Running Out of Food in the Last Year: Never true    Ran Out of Food in the Last Year: Never true  Transportation Needs: No Transportation Needs (11/06/2021)   PRAPARE - Hydrologist (Medical): No    Lack of Transportation (Non-Medical): No  Physical Activity: Not on file  Stress: Not on file  Social Connections: Not on file   Additional Social History:                         Sleep: Good  Appetite:  Good  Current Medications: Current  Facility-Administered Medications  Medication Dose Route Frequency Provider Last Rate Last Admin   acetaminophen (TYLENOL) tablet 650 mg  650 mg Oral Q6H PRN Patrecia Pour, NP       alum & mag hydroxide-simeth (MAALOX/MYLANTA) 200-200-20 MG/5ML suspension 30 mL  30 mL Oral Q4H PRN Patrecia Pour, NP       amLODipine (NORVASC) tablet 10 mg  10 mg Oral Daily Patrecia Pour, NP   10 mg at 11/09/21 7824   aspirin EC tablet 81 mg  81 mg Oral BID Patrecia Pour, NP   81 mg at 11/09/21 1000   cloNIDine (CATAPRES - Dosed in mg/24 hr) patch 0.3 mg  0.3 mg Transdermal Weekly Parks Ranger, DO   0.3 mg at 11/09/21 0955   clopidogrel (PLAVIX) tablet 75 mg  75 mg Oral Daily Patrecia Pour, NP   75 mg at 11/09/21 2353   cyanocobalamin (VITAMIN B12) tablet 1,000 mcg  1,000 mcg Oral Daily Patrecia Pour, NP   1,000 mcg at 11/09/21 0954   escitalopram (LEXAPRO) tablet 20 mg  20 mg Oral Daily Patrecia Pour, NP   20 mg at 11/09/21 6144   ferrous sulfate tablet 325 mg  325 mg Oral Q breakfast Patrecia Pour, NP   325 mg at 11/09/21 3154   isosorbide mononitrate (IMDUR) 24 hr tablet 30 mg  30 mg Oral QPM Patrecia Pour, NP   30 mg at 11/08/21 1702   levothyroxine (SYNTHROID) tablet 50 mcg  50 mcg Oral Daily Patrecia Pour, NP   50 mcg at 11/09/21 0086   lisinopril (ZESTRIL) tablet 40 mg  40 mg Oral Daily Patrecia Pour, NP   40 mg at 11/09/21 0954   magnesium hydroxide (MILK OF MAGNESIA) suspension 30 mL  30 mL Oral Daily PRN Patrecia Pour, NP       metFORMIN (GLUCOPHAGE) tablet 1,000 mg  1,000 mg Oral BID WC Patrecia Pour, NP   1,000 mg at 11/09/21 0814   metoprolol succinate (TOPROL-XL) 24 hr tablet 25 mg  25 mg Oral QPM Patrecia Pour, NP   25 mg at 11/08/21 1700   multivitamin with minerals tablet 1 tablet  1 tablet Oral Daily Patrecia Pour, NP   1 tablet at 11/09/21 0955   pravastatin (PRAVACHOL) tablet 20 mg  20 mg Oral QHS Patrecia Pour, NP   20 mg at 11/08/21 2155   risperiDONE  (RISPERDAL) tablet 0.5 mg  0.5 mg Oral [id number] Parks Ranger, DO   0.5 mg at 11/09/21 0814   rOPINIRole (REQUIP) tablet 1 mg  1 mg  Oral QHS Patrecia Pour, NP   1 mg at 11/08/21 2155   spironolactone (ALDACTONE) tablet 25 mg  25 mg Oral Daily Patrecia Pour, NP   25 mg at 11/09/21 0569    Lab Results: No results found for this or any previous visit (from the past 48 hour(s)).  Blood Alcohol level:  Lab Results  Component Value Date   ETH <10 11/04/2021   ETH <10 79/48/0165    Metabolic Disorder Labs: Lab Results  Component Value Date   HGBA1C 6.5 (H) 11/10/2020   MPG 139.85 11/10/2020   MPG 143 10/04/2020   No results found for: "PROLACTIN" Lab Results  Component Value Date   CHOL 82 11/10/2020   TRIG 113 11/10/2020   HDL 29 (L) 11/10/2020   CHOLHDL 2.8 11/10/2020   VLDL 23 11/10/2020   LDLCALC 30 11/10/2020   LDLCALC 61 10/04/2020    Physical Findings: AIMS:  , ,  ,  ,    CIWA:    COWS:     Musculoskeletal: Strength & Muscle Tone: within normal limits Gait & Station: normal Patient leans: N/A  Psychiatric Specialty Exam:  Presentation  General Appearance:  Appropriate for Environment  Eye Contact: Fair  Speech: Clear and Coherent  Speech Volume: Normal  Handedness: Right   Mood and Affect  Mood: Depressed; Hopeless  Affect: Blunt; Congruent   Thought Process  Thought Processes: Coherent  Descriptions of Associations:Intact  Orientation:Full (Time, Place and Person)  Thought Content:WDL; Rumination  History of Schizophrenia/Schizoaffective disorder:No data recorded Duration of Psychotic Symptoms:No data recorded Hallucinations:No data recorded Ideas of Reference:None  Suicidal Thoughts:No data recorded Homicidal Thoughts:No data recorded  Sensorium  Memory: Immediate Good; Recent Good  Judgment: Poor  Insight: Fair   Community education officer  Concentration: Good  Attention  Span: Good  Recall: Good  Fund of Knowledge: Good  Language: Fair   Psychomotor Activity  Psychomotor Activity:No data recorded  Assets  Assets: Communication Skills; Desire for Improvement; Financial Resources/Insurance; Housing; Social Support; Resilience   Sleep  Sleep:No data recorded    Blood pressure (!) 153/66, pulse 77, temperature (!) 97.5 F (36.4 C), resp. rate 18, height '5\' 8"'$  (1.727 m), weight 82.3 kg, SpO2 98 %. Body mass index is 27.6 kg/m.   Treatment Plan Summary: Daily contact with patient to assess and evaluate symptoms and progress in treatment, Medication management, and Plan continue current medications.  Parks Ranger, DO 11/09/2021, 12:01 PM

## 2021-11-09 NOTE — Plan of Care (Signed)
  Problem: Education: Goal: Knowledge of General Education information will improve Description: Including pain rating scale, medication(s)/side effects and non-pharmacologic comfort measures Outcome: Progressing   Problem: Health Behavior/Discharge Planning: Goal: Ability to manage health-related needs will improve Outcome: Progressing   Problem: Clinical Measurements: Goal: Cardiovascular complication will be avoided Outcome: Progressing   Problem: Nutrition: Goal: Adequate nutrition will be maintained Outcome: Progressing   Problem: Elimination: Goal: Will not experience complications related to bowel motility Outcome: Progressing Goal: Will not experience complications related to urinary retention Outcome: Progressing

## 2021-11-10 DIAGNOSIS — F322 Major depressive disorder, single episode, severe without psychotic features: Secondary | ICD-10-CM | POA: Diagnosis not present

## 2021-11-10 NOTE — BHH Counselor (Signed)
CSW updated patient's son on patients progress.    CSW explained that one of the requirements for discharge is that the patient has been psych cleared as this was a concern from son's wife.  CSW also updated that she has not spoken with patient at this time, however will follow up will speak with him to ask permission to speak with Steve Ramirez to determine if patient is able to return.  CSW also explained that the patient is his own guardian.  Son expressed frustration that no one except this CSW has reached out to him and he would like for psychiatrist to call him.  CSW requested that psychiatrist speak to family.  Assunta Curtis, MSW, LCSW 11/10/2021 12:38 PM

## 2021-11-10 NOTE — Plan of Care (Signed)
  Problem: Education: Goal: Knowledge of General Education information will improve Description: Including pain rating scale, medication(s)/side effects and non-pharmacologic comfort measures Outcome: Progressing   Problem: Health Behavior/Discharge Planning: Goal: Ability to manage health-related needs will improve Outcome: Progressing   Problem: Clinical Measurements: Goal: Respiratory complications will improve Outcome: Progressing   Problem: Activity: Goal: Risk for activity intolerance will decrease Outcome: Progressing   Problem: Coping: Goal: Level of anxiety will decrease Outcome: Progressing   Problem: Pain Managment: Goal: General experience of comfort will improve Outcome: Progressing   Problem: Safety: Goal: Ability to remain free from injury will improve Outcome: Progressing   Problem: Coping: Goal: Ability to verbalize frustrations and anger appropriately will improve Outcome: Progressing   Problem: Education: Goal: Emotional status will improve Outcome: Not Progressing  Patient alert and pleasant on assessment.  Patient is medication compliant.  Patient denies AVH, SI/Hi and pain but does endorse anxiety and depression due to being away from his wife that he has been married to for 45 years,  Patient very emotional, becoming teary eyed at times, stating " I wish I could just go back to where my wife is but other people are making the decisions".  Patient has been out of room walking in the hallway with the assistance of a walker.  Patient in dayroom socializing with other patients and also visited with daughter in law.  Patient can contract for safety.  Q 15 minute rounds in progress, will continue to monitor.

## 2021-11-10 NOTE — Group Note (Signed)
[facility] LCSW Group Therapy Note    Group Date: 11/10/2021 Start Time: 2956 End Time: 1405  Type of Therapy and Topic:  Group Therapy:  Overcoming Obstacles  Participation Level:  [facility] PARTICIPATION LEVEL: Did Not Attend  Mood:  Description of Group:   In this group patients will be encouraged to explore what they see as obstacles to their own wellness and recovery. They will be guided to discuss their thoughts, feelings, and behaviors related to these obstacles. The group will process together ways to cope with barriers, with attention given to specific choices patients can make. Each patient will be challenged to identify changes they are motivated to make in order to overcome their obstacles. This group will be process-oriented, with patients participating in exploration of their own experiences as well as giving and receiving support and challenge from other group members.  Therapeutic Goals: 1. Patient will identify personal and current obstacles as they relate to admission. 2. Patient will identify barriers that currently interfere with their wellness or overcoming obstacles.  3. Patient will identify feelings, thought process and behaviors related to these barriers. 4. Patient will identify two changes they are willing to make to overcome these obstacles:    Summary of Patient Progress   Patient declined to attend treatment.   Therapeutic Modalities:   Cognitive Behavioral Therapy Solution Focused Therapy Motivational Interviewing Relapse Prevention Therapy   Rozann Lesches, LCSW

## 2021-11-10 NOTE — Progress Notes (Signed)
Dar Note: Patient presents with a flat affect and depressed mood.  Reports passive SI but verbally contracts for safety.  Medication given as prescribed.  Patient visible in milieu with minimal interaction with peers.  Routine safety checks maintained.  Support and encouragement offered as needed.  Patient ambulatory on the unit with a walker.  Patient is safe on the unit.  Offered no complaint.

## 2021-11-10 NOTE — Progress Notes (Signed)
[facility] MD Progress Note  11/10/2021 2:07 PM Steve Ramirez  MRN:  [id number] Subjective: Steve Ramirez was seen on rounds.  He states that he is feeling better.  He is not tearful.  He still feels sorry for himself because his wife is doing well without him.  Social work informs me that he will be discharged to his son and they will be working on assisted living.  He has been pleasant and cooperative.  Has been compliant with his medication.  No side effects.  He denies any SI.  Principal Problem: Major depressive disorder, single episode, severe without psychosis ([place]) Diagnosis: Principal Problem:   Major depressive disorder, single episode, severe without psychosis ([place])  Total Time spent with patient: 15 minutes  Past Psychiatric History: None  Past Medical History:  Past Medical History:  Diagnosis Date   Coronary atherosclerosis of native coronary artery    Essential hypertension, benign    GIB (gastrointestinal bleeding)    Colonic diverticular bleed   History of melanoma    History of TIAs    Stroke ([place])    Type 2 diabetes mellitus ([place])     Past Surgical History:  Procedure Laterality Date   COLONOSCOPY  08/13/2010   Procedure: COLONOSCOPY;  Surgeon: Rogene Houston, MD;  Location: [facility] ENDO [facility];  Service: Endoscopy;  Laterality: N/A;   JOINT REPLACEMENT     KNEE SURGERY     arthroscopic   LEFT HEART CATHETERIZATION WITH CORONARY ANGIOGRAM N/A 11/24/2010   Procedure: LEFT HEART CATHETERIZATION WITH CORONARY ANGIOGRAM;  Surgeon: Josue Hector, MD;  Location: [facility] CATH LAB;  Service: Cardiovascular;  Laterality: N/A;   neck fusion     years ago   TOTAL HIP ARTHROPLASTY Right 08/02/2018   Procedure: TOTAL HIP ARTHROPLASTY ANTERIOR APPROACH;  Surgeon: Renette Butters, MD;  Location: [facility] ORS;  Service: Orthopedics;  Laterality: Right;   TOTAL HIP ARTHROPLASTY Left 10/18/2018   Procedure: TOTAL HIP ARTHROPLASTY ANTERIOR APPROACH;  Surgeon: Renette Butters, MD;  Location: [facility] ORS;   Service: Orthopedics;  Laterality: Left;   Family History:  Family History  Problem Relation Age of Onset   Coronary artery disease Father    Asthma Other    Emphysema Mother    Coronary artery disease Paternal Uncle    Brain cancer Brother     Social History:  Social History   Substance and Sexual Activity  Alcohol Use Yes   Comment: occasionally     Social History   Substance and Sexual Activity  Drug Use No    Social History   Socioeconomic History   Marital status: Married    Spouse name: Not on file   Number of children: Not on file   Years of education: Not on file   Highest education level: Not on file  Occupational History   Occupation: retired  Tobacco Use   Smoking status: Former    Types: Cigarettes    Quit date: 01/13/1980    Years since quitting: 41.8   Smokeless tobacco: Never  Vaping Use   Vaping Use: Never used  Substance and Sexual Activity   Alcohol use: Yes    Comment: occasionally   Drug use: No   Sexual activity: Not Currently    Partners: Female  Other Topics Concern   Not on file  Social History Narrative   Lives with wife. Grew up in [place] and after college moved to [place]. Got married. Moved to [place] and then to [place], [place].  Attends [employer].    Masters health education, administration   3 children   Caffeine- 1 pot coffee daily.   Daughter is a Education officer, museum. Son is a Higher education careers adviser. Son is in [employer].       Social Determinants of Health   Financial Resource Strain: Not on file  Food Insecurity: No Food Insecurity (11/06/2021)   Hunger Vital Sign    Worried About Running Out of Food in the Last Year: Never true    Ran Out of Food in the Last Year: Never true  Transportation Needs: No Transportation Needs (11/06/2021)   PRAPARE - Hydrologist (Medical): No    Lack of Transportation (Non-Medical): No  Physical Activity: Not on file  Stress: Not on file  Social  Connections: Not on file   Additional Social History:                         Sleep: Good  Appetite:  Good  Current Medications: Current Facility-Administered Medications  Medication Dose Route Frequency Provider Last Rate Last Admin   acetaminophen (TYLENOL) tablet 650 mg  650 mg Oral Q6H PRN Patrecia Pour, NP       alum & mag hydroxide-simeth (MAALOX/MYLANTA) 200-200-20 MG/5ML suspension 30 mL  30 mL Oral Q4H PRN Patrecia Pour, NP       amLODipine (NORVASC) tablet 10 mg  10 mg Oral Daily Patrecia Pour, NP   10 mg at 11/10/21 3790   aspirin EC tablet 81 mg  81 mg Oral BID Patrecia Pour, NP   81 mg at 11/10/21 2409   cloNIDine (CATAPRES - Dosed in mg/24 hr) patch 0.3 mg  0.3 mg Transdermal Weekly Parks Ranger, DO   0.3 mg at 11/09/21 0955   clopidogrel (PLAVIX) tablet 75 mg  75 mg Oral Daily Patrecia Pour, NP   75 mg at 11/10/21 7353   cyanocobalamin (VITAMIN B12) tablet 1,000 mcg  1,000 mcg Oral Daily Patrecia Pour, NP   1,000 mcg at 11/10/21 2992   escitalopram (LEXAPRO) tablet 20 mg  20 mg Oral Daily Patrecia Pour, NP   20 mg at 11/10/21 4268   ferrous sulfate tablet 325 mg  325 mg Oral Q breakfast Patrecia Pour, NP   325 mg at 11/10/21 0902   isosorbide mononitrate (IMDUR) 24 hr tablet 30 mg  30 mg Oral QPM Patrecia Pour, NP   30 mg at 11/09/21 1803   levothyroxine (SYNTHROID) tablet 50 mcg  50 mcg Oral Daily Patrecia Pour, NP   50 mcg at 11/10/21 3419   lisinopril (ZESTRIL) tablet 40 mg  40 mg Oral Daily Patrecia Pour, NP   40 mg at 11/10/21 6222   magnesium hydroxide (MILK OF MAGNESIA) suspension 30 mL  30 mL Oral Daily PRN Patrecia Pour, NP       metFORMIN (GLUCOPHAGE) tablet 1,000 mg  1,000 mg Oral BID WC Patrecia Pour, NP   1,000 mg at 11/10/21 0902   metoprolol succinate (TOPROL-XL) 24 hr tablet 25 mg  25 mg Oral QPM Patrecia Pour, NP   25 mg at 11/09/21 1803   multivitamin with minerals tablet 1 tablet  1 tablet Oral Daily Patrecia Pour, NP   1 tablet at 11/10/21 0953   pravastatin (PRAVACHOL) tablet 20 mg  20 mg Oral QHS Patrecia Pour, NP   20 mg at  11/09/21 2131   risperiDONE (RISPERDAL) tablet 0.5 mg  0.5 mg Oral [id number] Parks Ranger, DO   0.5 mg at 11/10/21 8832   rOPINIRole (REQUIP) tablet 1 mg  1 mg Oral QHS Patrecia Pour, NP   1 mg at 11/09/21 2131   spironolactone (ALDACTONE) tablet 25 mg  25 mg Oral Daily Patrecia Pour, NP   25 mg at 11/10/21 5498    Lab Results: No results found for this or any previous visit (from the past 48 hour(s)).  Blood Alcohol level:  Lab Results  Component Value Date   ETH <10 11/04/2021   ETH <10 26/41/5830    Metabolic Disorder Labs: Lab Results  Component Value Date   HGBA1C 6.5 (H) 11/10/2020   MPG 139.85 11/10/2020   MPG 143 10/04/2020   No results found for: "PROLACTIN" Lab Results  Component Value Date   CHOL 82 11/10/2020   TRIG 113 11/10/2020   HDL 29 (L) 11/10/2020   CHOLHDL 2.8 11/10/2020   VLDL 23 11/10/2020   LDLCALC 30 11/10/2020   LDLCALC 61 10/04/2020    Physical Findings: AIMS:  , ,  ,  ,    CIWA:    COWS:     Musculoskeletal: Strength & Muscle Tone: within normal limits Gait & Station: normal Patient leans: N/A  Psychiatric Specialty Exam:  Presentation  General Appearance:  Appropriate for Environment  Eye Contact: Fair  Speech: Clear and Coherent  Speech Volume: Normal  Handedness: Right   Mood and Affect  Mood: Depressed; Hopeless  Affect: Blunt; Congruent   Thought Process  Thought Processes: Coherent  Descriptions of Associations:Intact  Orientation:Full (Time, Place and Person)  Thought Content:WDL; Rumination  History of Schizophrenia/Schizoaffective disorder:No data recorded Duration of Psychotic Symptoms:No data recorded Hallucinations:No data recorded Ideas of Reference:None  Suicidal Thoughts:No data recorded Homicidal Thoughts:No data recorded  Sensorium   Memory: Immediate Good; Recent Good  Judgment: Poor  Insight: Fair   Community education officer  Concentration: Good  Attention Span: Good  Recall: Good  Fund of Knowledge: Good  Language: Fair   Psychomotor Activity  Psychomotor Activity:No data recorded  Assets  Assets: Communication Skills; Desire for Improvement; Financial Resources/Insurance; Housing; Social Support; Resilience   Sleep  Sleep:No data recorded   Physical Exam: Physical Exam Vitals and nursing note reviewed.  Constitutional:      Appearance: Normal appearance. He is normal weight.  Neurological:     General: No focal deficit present.     Mental Status: He is alert and oriented to person, place, and time.  Psychiatric:        Attention and Perception: Attention and perception normal.        Mood and Affect: Mood is depressed.        Speech: Speech normal.        Behavior: Behavior normal. Behavior is cooperative.        Thought Content: Thought content normal.        Cognition and Memory: Cognition and memory normal.        Judgment: Judgment normal.    Review of Systems  Constitutional: Negative.   HENT: Negative.    Eyes: Negative.   Respiratory: Negative.    Cardiovascular: Negative.   Gastrointestinal: Negative.   Genitourinary: Negative.   Musculoskeletal: Negative.   Skin: Negative.   Neurological: Negative.   Endo/Heme/Allergies: Negative.   Psychiatric/Behavioral:  Positive for depression.    Blood pressure 138/62, pulse 67, temperature 98 F (36.7 C), resp.  rate 18, height '5\' 8"'$  (1.727 m), weight 82.3 kg, SpO2 99 %. Body mass index is 27.6 kg/m.   Treatment Plan Summary: Daily contact with patient to assess and evaluate symptoms and progress in treatment, Medication management, and Plan continue current medications.  Parks Ranger, DO 11/10/2021, 2:07 PM

## 2021-11-10 NOTE — BHH Counselor (Signed)
CSW spoke with patient about possibility that he may not be able to return to [facility] following his suicide attempt.  CSW explained that there are sometimes policies against this.   Patient expressed anger and frustration at the thought that he can not return.  He stated "I pay them people $9800 a month! I've already paid for the other half of that bed."  He expressed that if he is not allowed to return he will remove his wife from the facility as well.  CSW reminded patient that there is a possibility that he can return, CSW is unsure at this time.  CSW obtained verbal permission to speak with [facility] staff.  CSW called, however, was unable to get through.  CSW will continue to follow up.  Assunta Curtis, MSW, LCSW 11/10/2021 3:17 PM

## 2021-11-10 NOTE — B[facility] Counselor (Signed)
CSW attempted to contact [facility] to ascertain if the patient can return.    CSW spoke with Butch Penny on staff who transferred me to several different staff, all of which were unavailable; two of the lines rang incessantly without being answered and the other led to a voicemail box that was full.  CSW to attempt again at a later date.  Assunta Curtis, MSW, LCSW 11/10/2021 3:57 PM

## 2021-11-10 NOTE — BH Assessment (Signed)
1940 Received patient sitting in the day room visiting with his daughter. He is visibly crying and wiping his eyes.   2045 Patient is currently denying SI/HI, A/V hallucinations. He is sad and depressed over missing wife. His daughter appear to being doing a great job in trying to console patient. Will continue to monitor patient for safety.  2200 Patient is medication compliant. He is able to care for his needs without difficulty,  0300 Patient has rested quietly in bed most of shift except for occasional bathroom visits.   0625 Patient is resting quietly in bed. He has not made any complaints. Will continue to monitor patient for safety.  0648 AM medication given.

## 2021-11-11 DIAGNOSIS — F322 Major depressive disorder, single episode, severe without psychotic features: Secondary | ICD-10-CM | POA: Diagnosis not present

## 2021-11-11 NOTE — Progress Notes (Signed)
   11/11/21 1935  Psych Admission Type (Psych Patients Only)  Admission Status Involuntary  Psychosocial Assessment  Patient Complaints Sadness (being away from his wife)  Eye Contact Fair  Facial Expression Sad  Affect Appropriate to circumstance  Speech Logical/coherent  Interaction Assertive  Motor Activity Slow  Appearance/Hygiene Unremarkable;In scrubs  Behavior Characteristics Cooperative  Mood Sad;Pleasant  Thought Process  Coherency WDL  Content WDL  Delusions None reported or observed  Perception WDL  Hallucination None reported or observed  Judgment WDL  Confusion None  Danger to Self  Current suicidal ideation? Denies  Danger to Others  Danger to Others None reported or observed   Progress note   D: Pt seen in his room. Pt denies SI, HI, AVH. Pt rates pain  0/10. Pt rates anxiety  0/10 and depression  0/10. Pt states that he misses his wife. He is sad because he said he made a "dumb mistake." "I should have known better than to do something like that. I know better." Pt provided emotional support. Pt says that now he has to follow a lot of rules just to be with his wife. Pt states they have been married for 56 years and he is used to doing most things for her. "They take her to breakfast and sit in my chair and make sure she eats enough of her meals." Pt reports he may be discharged on Friday. Pt compliant with medications. Seen in milieu. Uses a walker. No other concerns noted at this time.  A: Pt provided support and encouragement. Pt given scheduled medication as prescribed. PRNs as appropriate. Q15 min checks for safety.   R: Pt safe on the unit. Will continue to monitor.

## 2021-11-11 NOTE — BHH Counselor (Signed)
CSW again attempted to contact the patient's Assisted Living.  CSW spoke with Mateo Flow. She reports that she will request that the Health and Wellness Director give this CSW a return call.  Assunta Curtis, MSW, LCSW 11/11/2021 9:27 AM

## 2021-11-11 NOTE — Plan of Care (Signed)
  Problem: Education: Goal: Knowledge of General Education information will improve Description: Including pain rating scale, medication(s)/side effects and non-pharmacologic comfort measures Outcome: Progressing   Problem: Health Behavior/Discharge Planning: Goal: Ability to manage health-related needs will improve Outcome: Progressing   Problem: Clinical Measurements: Goal: Will remain free from infection Outcome: Progressing Goal: Respiratory complications will improve Outcome: Progressing Goal: Cardiovascular complication will be avoided Outcome: Progressing   Problem: Safety: Goal: Ability to remain free from injury will improve Outcome: Progressing

## 2021-11-11 NOTE — BH Assessment (Signed)
CSW has faxed the requested material to [facility] at [facility].  Assunta Curtis, MSW, LCSW 11/11/2021 12:37 PM

## 2021-11-11 NOTE — Progress Notes (Signed)
[facility] MD Progress Note  11/11/2021 12:03 PM Steve Ramirez  MRN:  [id number] Subjective: Steve Ramirez is seen on rounds.  His affect is much improved.  He states that he is felt much better since starting the new medication, Risperdal.  He is not as obsessed over whether his wife is doing well or not.  He knows that she is doing fine.  He is okay with that even though he is not involved.  I spoke with his son yesterday and said that his dad does get obsessed over certain topics most of all is mom.  He would like to return to the assisted living under the new guise of having his wife more separated so the staff can do their job.  He regrets the overdose and states that he would never do that again.  He denies any suicidal ideation.  He has been compliant with medications and denies any side effects.  He states that he slept well last night and feels good.  Principal Problem: Major depressive disorder, single episode, severe without psychosis ([place]) Diagnosis: Principal Problem:   Major depressive disorder, single episode, severe without psychosis ([place])  Total Time spent with patient: 15 minutes  Past Psychiatric History: None  Past Medical History:  Past Medical History:  Diagnosis Date   Coronary atherosclerosis of native coronary artery    Essential hypertension, benign    GIB (gastrointestinal bleeding)    Colonic diverticular bleed   History of melanoma    History of TIAs    Stroke ([place])    Type 2 diabetes mellitus ([place])     Past Surgical History:  Procedure Laterality Date   COLONOSCOPY  08/13/2010   Procedure: COLONOSCOPY;  Surgeon: Rogene Houston, MD;  Location: [facility];  Service: Endoscopy;  Laterality: N/A;   JOINT REPLACEMENT     KNEE SURGERY     arthroscopic   LEFT HEART CATHETERIZATION WITH CORONARY ANGIOGRAM N/A 11/24/2010   Procedure: LEFT HEART CATHETERIZATION WITH CORONARY ANGIOGRAM;  Surgeon: Josue Hector, MD;  Location: [facility] CATH LAB;  Service:  Cardiovascular;  Laterality: N/A;   neck fusion     years ago   TOTAL HIP ARTHROPLASTY Right 08/02/2018   Procedure: TOTAL HIP ARTHROPLASTY ANTERIOR APPROACH;  Surgeon: Renette Butters, MD;  Location: [facility] ORS;  Service: Orthopedics;  Laterality: Right;   TOTAL HIP ARTHROPLASTY Left 10/18/2018   Procedure: TOTAL HIP ARTHROPLASTY ANTERIOR APPROACH;  Surgeon: Renette Butters, MD;  Location: [facility] ORS;  Service: Orthopedics;  Laterality: Left;   Family History:  Family History  Problem Relation Age of Onset   Coronary artery disease Father    Asthma Other    Emphysema Mother    Coronary artery disease Paternal Uncle    Brain cancer Brother     Social History:  Social History   Substance and Sexual Activity  Alcohol Use Yes   Comment: occasionally     Social History   Substance and Sexual Activity  Drug Use No    Social History   Socioeconomic History   Marital status: Married    Spouse name: Not on file   Number of children: Not on file   Years of education: Not on file   Highest education level: Not on file  Occupational History   Occupation: retired  Tobacco Use   Smoking status: Former    Types: Cigarettes    Quit date: 01/13/1980    Years since quitting: 41.8   Smokeless tobacco: Never  Vaping Use   Vaping Use: Never used  Substance and Sexual Activity   Alcohol use: Yes    Comment: occasionally   Drug use: No   Sexual activity: Not Currently    Partners: Female  Other Topics Concern   Not on file  Social History Narrative   Lives with wife. Grew up in [place] and after college moved to [place]. Got married. Moved to [place] and then to [place], [place].    Attends [employer].    Masters health education, administration   3 children   Caffeine- 1 pot coffee daily.   Daughter is a Education officer, museum. Son is a Higher education careers adviser. Son is in [employer].       Social Determinants of Health   Financial Resource Strain: Not on file  Food Insecurity:  No Food Insecurity (11/06/2021)   Hunger Vital Sign    Worried About Running Out of Food in the Last Year: Never true    Ran Out of Food in the Last Year: Never true  Transportation Needs: No Transportation Needs (11/06/2021)   PRAPARE - Hydrologist (Medical): No    Lack of Transportation (Non-Medical): No  Physical Activity: Not on file  Stress: Not on file  Social Connections: Not on file   Additional Social History:                         Sleep: Good  Appetite:  Good  Current Medications: Current Facility-Administered Medications  Medication Dose Route Frequency Provider Last Rate Last Admin   acetaminophen (TYLENOL) tablet 650 mg  650 mg Oral Q6H PRN Patrecia Pour, NP       alum & mag hydroxide-simeth (MAALOX/MYLANTA) 200-200-20 MG/5ML suspension 30 mL  30 mL Oral Q4H PRN Patrecia Pour, NP       amLODipine (NORVASC) tablet 10 mg  10 mg Oral Daily Patrecia Pour, NP   10 mg at 11/11/21 0913   aspirin EC tablet 81 mg  81 mg Oral BID Patrecia Pour, NP   81 mg at 11/11/21 0913   cloNIDine (CATAPRES - Dosed in mg/24 hr) patch 0.3 mg  0.3 mg Transdermal Weekly Parks Ranger, DO   0.3 mg at 11/09/21 0955   clopidogrel (PLAVIX) tablet 75 mg  75 mg Oral Daily Patrecia Pour, NP   75 mg at 11/11/21 5397   cyanocobalamin (VITAMIN B12) tablet 1,000 mcg  1,000 mcg Oral Daily Patrecia Pour, NP   1,000 mcg at 11/11/21 0913   escitalopram (LEXAPRO) tablet 20 mg  20 mg Oral Daily Patrecia Pour, NP   20 mg at 11/11/21 6734   ferrous sulfate tablet 325 mg  325 mg Oral Q breakfast Patrecia Pour, NP   325 mg at 11/11/21 0913   isosorbide mononitrate (IMDUR) 24 hr tablet 30 mg  30 mg Oral QPM Patrecia Pour, NP   30 mg at 11/10/21 1706   levothyroxine (SYNTHROID) tablet 50 mcg  50 mcg Oral Daily Patrecia Pour, NP   50 mcg at 11/11/21 1937   lisinopril (ZESTRIL) tablet 40 mg  40 mg Oral Daily Patrecia Pour, NP   40 mg at 11/11/21 0912    magnesium hydroxide (MILK OF MAGNESIA) suspension 30 mL  30 mL Oral Daily PRN Patrecia Pour, NP       metFORMIN (GLUCOPHAGE) tablet 1,000 mg  1,000 mg Oral BID WC Lord,  Asa Saunas, NP   1,000 mg at 11/11/21 0913   metoprolol succinate (TOPROL-XL) 24 hr tablet 25 mg  25 mg Oral QPM Patrecia Pour, NP   25 mg at 11/10/21 1704   multivitamin with minerals tablet 1 tablet  1 tablet Oral Daily Patrecia Pour, NP   1 tablet at 11/11/21 0912   pravastatin (PRAVACHOL) tablet 20 mg  20 mg Oral QHS Patrecia Pour, NP   20 mg at 11/10/21 2114   risperiDONE (RISPERDAL) tablet 0.5 mg  0.5 mg Oral [id number] Parks Ranger, DO   0.5 mg at 11/11/21 0912   rOPINIRole (REQUIP) tablet 1 mg  1 mg Oral QHS Patrecia Pour, NP   1 mg at 11/10/21 2114   spironolactone (ALDACTONE) tablet 25 mg  25 mg Oral Daily Patrecia Pour, NP   25 mg at 11/11/21 2500    Lab Results: No results found for this or any previous visit (from the past 48 hour(s)).  Blood Alcohol level:  Lab Results  Component Value Date   ETH <10 11/04/2021   ETH <10 37/04/8887    Metabolic Disorder Labs: Lab Results  Component Value Date   HGBA1C 6.5 (H) 11/10/2020   MPG 139.85 11/10/2020   MPG 143 10/04/2020   No results found for: "PROLACTIN" Lab Results  Component Value Date   CHOL 82 11/10/2020   TRIG 113 11/10/2020   HDL 29 (L) 11/10/2020   CHOLHDL 2.8 11/10/2020   VLDL 23 11/10/2020   LDLCALC 30 11/10/2020   LDLCALC 61 10/04/2020    Physical Findings: AIMS:  , ,  ,  ,    CIWA:    COWS:     Musculoskeletal: Strength & Muscle Tone: within normal limits Gait & Station: normal Patient leans: N/A  Psychiatric Specialty Exam:  Presentation  General Appearance:  Appropriate for Environment  Eye Contact: Fair  Speech: Clear and Coherent  Speech Volume: Normal  Handedness: Right   Mood and Affect  Mood: Depressed; Hopeless  Affect: Blunt; Congruent   Thought Process  Thought  Processes: Coherent  Descriptions of Associations:Intact  Orientation:Full (Time, Place and Person)  Thought Content:WDL; Rumination  History of Schizophrenia/Schizoaffective disorder:No data recorded Duration of Psychotic Symptoms:No data recorded Hallucinations:No data recorded Ideas of Reference:None  Suicidal Thoughts:No data recorded Homicidal Thoughts:No data recorded  Sensorium  Memory: Immediate Good; Recent Good  Judgment: Poor  Insight: Fair   Community education officer  Concentration: Good  Attention Span: Good  Recall: Good  Fund of Knowledge: Good  Language: Fair   Psychomotor Activity  Psychomotor Activity:No data recorded  Assets  Assets: Communication Skills; Desire for Improvement; Financial Resources/Insurance; Housing; Social Support; Resilience   Sleep  Sleep:No data recorded   Physical Exam: Physical Exam Vitals and nursing note reviewed.  Constitutional:      Appearance: Normal appearance. He is normal weight.  Neurological:     General: No focal deficit present.     Mental Status: He is alert and oriented to person, place, and time.  Psychiatric:        Attention and Perception: Attention and perception normal.        Mood and Affect: Mood and affect normal.        Speech: Speech normal.        Behavior: Behavior normal. Behavior is cooperative.        Thought Content: Thought content normal.        Cognition and Memory: Cognition and memory normal.  Judgment: Judgment normal.    Review of Systems  Constitutional: Negative.   HENT: Negative.    Eyes: Negative.   Respiratory: Negative.    Cardiovascular: Negative.   Gastrointestinal: Negative.   Genitourinary: Negative.   Musculoskeletal: Negative.   Skin: Negative.   Neurological: Negative.   Endo/Heme/Allergies: Negative.   Psychiatric/Behavioral: Negative.     Blood pressure (!) 112/59, pulse 67, temperature 97.7 F (36.5 C), temperature source Oral,  resp. rate 18, height '5\' 8"'$  (1.727 m), weight 82.3 kg, SpO2 98 %. Body mass index is 27.6 kg/m.   Treatment Plan Summary: Daily contact with patient to assess and evaluate symptoms and progress in treatment, Medication management, and Plan continue current medications.  I recommend that he return to assisted living and believe that new medication and the new living situation will be helpful to his mental health.  Parks Ranger, DO 11/11/2021, 12:03 PM

## 2021-11-11 NOTE — Group Note (Signed)
[facility] LCSW Group Therapy Note   Group Date: 11/11/2021 Start Time: 9163 End Time: 1400  Type of Therapy/Topic:  Group Therapy:  Feelings about Diagnosis  Participation Level:  Active   Description of Group:    This group will allow patients to explore their thoughts and feelings about diagnoses they have received. Patients will be guided to explore their level of understanding and acceptance of these diagnoses. Facilitator will encourage patients to process their thoughts and feelings about the reactions of others to their diagnosis, and will guide patients in identifying ways to discuss their diagnosis with significant others in their lives. This group will be process-oriented, with patients participating in exploration of their own experiences as well as giving and receiving support and challenge from other group members.   Therapeutic Goals: 1. Patient will demonstrate understanding of diagnosis as evidence by identifying two or more symptoms of the disorder:  2. Patient will be able to express two feelings regarding the diagnosis 3. Patient will demonstrate ability to communicate their needs through discussion and/or role plays  Summary of Patient Progress: Patient was present in group.  Patient shared that he was admitted following being fatigued from taking on care of his face on an almost sole basis.  He reports that he thought "that's what married couples do".  He reports plans to have the Assisted Living team care for his wife more to support his wellbeing.  Therapeutic Modalities:   Cognitive Behavioral Therapy Brief Therapy Feelings Identification    Rozann Lesches, LCSW

## 2021-11-11 NOTE — Progress Notes (Signed)
Patient is A+O x 4. He denies SI/HI/AVH, depression and anxiety. Sadness noted but patient states he is doing much better and that he is not necessarily sad. "I want to be with my wife is all." Patient tends to isolate in his room but will present for meals and group. Positive interaction shown toward staff and other patients. Good appetite. Medication compliant. Patient says he now has his eyeglasses and partial dentures that were at home. Q15 minute unit checks remain in place.

## 2021-11-12 DIAGNOSIS — F322 Major depressive disorder, single episode, severe without psychotic features: Secondary | ICD-10-CM | POA: Diagnosis not present

## 2021-11-12 NOTE — BH IP Treatment Plan (Signed)
Interdisciplinary Treatment and Diagnostic Plan Update  11/12/2021 Time of Session: 8:30AM Steve Ramirez MRN: [id number]  Principal Diagnosis: Major depressive disorder, single episode, severe without psychosis ([place])  Secondary Diagnoses: Principal Problem:   Major depressive disorder, single episode, severe without psychosis ([place])   Current Medications:  Current Facility-Administered Medications  Medication Dose Route Frequency Provider Last Rate Last Admin   acetaminophen (TYLENOL) tablet 650 mg  650 mg Oral Q6H PRN Patrecia Pour, NP       alum & mag hydroxide-simeth (MAALOX/MYLANTA) 200-200-20 MG/5ML suspension 30 mL  30 mL Oral Q4H PRN Patrecia Pour, NP       amLODipine (NORVASC) tablet 10 mg  10 mg Oral Daily Patrecia Pour, NP   10 mg at 11/11/21 2440   aspirin EC tablet 81 mg  81 mg Oral BID Patrecia Pour, NP   81 mg at 11/11/21 2130   cloNIDine (CATAPRES - Dosed in mg/24 hr) patch 0.3 mg  0.3 mg Transdermal Weekly Parks Ranger, DO   0.3 mg at 11/09/21 0955   clopidogrel (PLAVIX) tablet 75 mg  75 mg Oral Daily Patrecia Pour, NP   75 mg at 11/11/21 1027   cyanocobalamin (VITAMIN B12) tablet 1,000 mcg  1,000 mcg Oral Daily Patrecia Pour, NP   1,000 mcg at 11/11/21 0913   escitalopram (LEXAPRO) tablet 20 mg  20 mg Oral Daily Patrecia Pour, NP   20 mg at 11/11/21 2536   ferrous sulfate tablet 325 mg  325 mg Oral Q breakfast Patrecia Pour, NP   325 mg at 11/11/21 0913   isosorbide mononitrate (IMDUR) 24 hr tablet 30 mg  30 mg Oral QPM Patrecia Pour, NP   30 mg at 11/11/21 1724   levothyroxine (SYNTHROID) tablet 50 mcg  50 mcg Oral Daily Patrecia Pour, NP   50 mcg at 11/12/21 0614   lisinopril (ZESTRIL) tablet 40 mg  40 mg Oral Daily Patrecia Pour, NP   40 mg at 11/11/21 0912   magnesium hydroxide (MILK OF MAGNESIA) suspension 30 mL  30 mL Oral Daily PRN Patrecia Pour, NP       metFORMIN (GLUCOPHAGE) tablet 1,000 mg  1,000 mg Oral BID WC Patrecia Pour, NP   1,000 mg at 11/11/21 1724   metoprolol succinate (TOPROL-XL) 24 hr tablet 25 mg  25 mg Oral QPM Patrecia Pour, NP   25 mg at 11/11/21 1723   multivitamin with minerals tablet 1 tablet  1 tablet Oral Daily Patrecia Pour, NP   1 tablet at 11/11/21 0912   pravastatin (PRAVACHOL) tablet 20 mg  20 mg Oral QHS Patrecia Pour, NP   20 mg at 11/11/21 2130   risperiDONE (RISPERDAL) tablet 0.5 mg  0.5 mg Oral [id number] Parks Ranger, DO   0.5 mg at 11/11/21 1623   rOPINIRole (REQUIP) tablet 1 mg  1 mg Oral QHS Patrecia Pour, NP   1 mg at 11/11/21 2130   spironolactone (ALDACTONE) tablet 25 mg  25 mg Oral Daily Patrecia Pour, NP   25 mg at 11/11/21 6440   PTA Medications: Medications Prior to Admission  Medication Sig Dispense Refill Last Dose   acetaminophen (TYLENOL) 325 MG tablet Take 2 tablets (650 mg total) by mouth every 4 (four) hours as needed for mild pain (or temp > 37.5 C (99.5 F)).      amLODipine (NORVASC) 10 MG tablet Take 1 tablet (  10 mg total) by mouth daily.      aspirin EC 81 MG tablet Take 1 tablet (81 mg total) by mouth 2 (two) times daily. For DVT prophylaxis for 30 days after surgery. 60 tablet 0    betamethasone dipropionate 0.05 % cream Apply 1 Application topically 2 (two) times daily as needed.      cetirizine (ZYRTEC) 10 MG tablet Take 10 mg by mouth daily.      cloNIDine (CATAPRES - DOSED IN MG/24 HR) 0.3 mg/24hr patch Place 1 patch (0.3 mg total) onto the skin once a week. (Patient taking differently: Place 0.3 mg onto the skin every Friday.) 4 patch 12    clopidogrel (PLAVIX) 75 MG tablet Take 1 tablet (75 mg total) by mouth daily. 90 tablet 0    escitalopram (LEXAPRO) 20 MG tablet Take 20 mg by mouth daily.      ferrous sulfate 325 (65 FE) MG tablet Take 325 mg by mouth daily with breakfast.      ipratropium-albuterol (DUONEB) 0.5-2.5 (3) MG/3ML SOLN Take 3 mLs by nebulization every 4 (four) hours as needed. 360 mL     isosorbide mononitrate  (IMDUR) 30 MG 24 hr tablet Take 30 mg by mouth every evening.       levothyroxine (SYNTHROID, LEVOTHROID) 50 MCG tablet Take 1 tablet (50 mcg total) by mouth daily. 90 tablet 0    lisinopril (PRINIVIL,ZESTRIL) 40 MG tablet Take 1 tablet (40 mg total) by mouth daily. 90 tablet 3    metFORMIN (GLUCOPHAGE) 1000 MG tablet Take 1,000 mg by mouth 2 (two) times daily.      metoprolol succinate (TOPROL-XL) 25 MG 24 hr tablet Take 25 mg by mouth every evening.       Multiple Vitamin (MULTIVITAMIN) tablet Take 1 tablet by mouth daily.        nitroGLYCERIN (NITROSTAT) 0.4 MG SL tablet Place 0.4 mg under the tongue every 5 (five) minutes as needed for chest pain.      Omega-3 Fatty Acids (FISH OIL) 1000 MG CAPS Take 1,000 mg by mouth at bedtime.      pravastatin (PRAVACHOL) 20 MG tablet Take 20 mg by mouth at bedtime.      rOPINIRole (REQUIP) 0.5 MG tablet Take 2 tablets (1 mg total) by mouth at bedtime. 60 tablet 0    spironolactone (ALDACTONE) 25 MG tablet Take 1 tablet (25 mg total) by mouth daily. 90 tablet 0    vitamin B-12 (CYANOCOBALAMIN) 1000 MCG tablet Take 1 tablet (1,000 mcg total) by mouth daily. 30 tablet 0     Patient Stressors: Other: Patient is having to care for his wife who has dementia    Patient Strengths: Marketing executive fund of knowledge  Supportive family/friends   Treatment Modalities: Medication Management, Group therapy, Case management,  1 to 1 session with clinician, Psychoeducation, Recreational therapy.   Physician Treatment Plan for Primary Diagnosis: Major depressive disorder, single episode, severe without psychosis ([place]) Long Term Goal(s): Improvement in symptoms so as ready for discharge   Short Term Goals: Ability to identify changes in lifestyle to reduce recurrence of condition will improve Ability to verbalize feelings will improve Ability to disclose and discuss suicidal ideas Ability to demonstrate self-control will improve Ability to identify  and develop effective coping behaviors will improve Ability to maintain clinical measurements within normal limits will improve Compliance with prescribed medications will improve Ability to identify triggers associated with substance abuse/mental health issues will improve  Medication Management: Evaluate patient's  response, side effects, and tolerance of medication regimen.  Therapeutic Interventions: 1 to 1 sessions, Unit Group sessions and Medication administration.  Evaluation of Outcomes: Progressing  Physician Treatment Plan for Secondary Diagnosis: Principal Problem:   Major depressive disorder, single episode, severe without psychosis ([place])  Long Term Goal(s): Improvement in symptoms so as ready for discharge   Short Term Goals: Ability to identify changes in lifestyle to reduce recurrence of condition will improve Ability to verbalize feelings will improve Ability to disclose and discuss suicidal ideas Ability to demonstrate self-control will improve Ability to identify and develop effective coping behaviors will improve Ability to maintain clinical measurements within normal limits will improve Compliance with prescribed medications will improve Ability to identify triggers associated with substance abuse/mental health issues will improve     Medication Management: Evaluate patient's response, side effects, and tolerance of medication regimen.  Therapeutic Interventions: 1 to 1 sessions, Unit Group sessions and Medication administration.  Evaluation of Outcomes: Progressing   RN Treatment Plan for Primary Diagnosis: Major depressive disorder, single episode, severe without psychosis ([place]) Long Term Goal(s): Knowledge of disease and therapeutic regimen to maintain health will improve  Short Term Goals: Ability to demonstrate self-control, Ability to participate in decision making will improve, Ability to verbalize feelings will improve, Ability to disclose and discuss  suicidal ideas, Ability to identify and develop effective coping behaviors will improve, and Compliance with prescribed medications will improve  Medication Management: RN will administer medications as ordered by provider, will assess and evaluate patient's response and provide education to patient for prescribed medication. RN will report any adverse and/or side effects to prescribing provider.  Therapeutic Interventions: 1 on 1 counseling sessions, Psychoeducation, Medication administration, Evaluate responses to treatment, Monitor vital signs and CBGs as ordered, Perform/monitor CIWA, COWS, AIMS and Fall Risk screenings as ordered, Perform wound care treatments as ordered.  Evaluation of Outcomes: Progressing   LCSW Treatment Plan for Primary Diagnosis: Major depressive disorder, single episode, severe without psychosis ([place]) Long Term Goal(s): Safe transition to appropriate next level of care at discharge, Engage patient in therapeutic group addressing interpersonal concerns.  Short Term Goals: Engage patient in aftercare planning with referrals and resources, Increase social support, Increase ability to appropriately verbalize feelings, Increase emotional regulation, Facilitate acceptance of mental health diagnosis and concerns, and Increase skills for wellness and recovery  Therapeutic Interventions: Assess for all discharge needs, 1 to 1 time with Social worker, Explore available resources and support systems, Assess for adequacy in community support network, Educate family and significant other(s) on suicide prevention, Complete Psychosocial Assessment, Interpersonal group therapy.  Evaluation of Outcomes: Progressing   Progress in Treatment: Attending groups: Yes. Participating in groups: Yes. Taking medication as prescribed: Yes. Toleration medication: Yes. Family/Significant other contact made: Yes, individual(s) contacted:  SPE completed with the patient's son. Patient  understands diagnosis: Yes. Discussing patient identified problems/goals with staff: Yes. Medical problems stabilized or resolved: Yes. Denies suicidal/homicidal ideation: Yes. Issues/concerns per patient self-inventory: No. Other: none  New problem(s) identified: No, Describe:  none  Update 11/12/2021:  No changes at this time.   New Short Term/Long Term Goal(s): detox, elimination of symptoms of psychosis, medication management for mood stabilization; elimination of SI thoughts; development of comprehensive mental wellness plan. Update 11/12/2021:  No changes at this time.   Patient Goals:  "Not to do something stupid again." Update 11/12/2021:  No changes at this time.   Discharge Plan or Barriers: CSW to assist with development of appropriate discharge plans.  Update 11/12/2021:  Patient reports plans to return to his home.  He is frustrated at the possibility that he may not be able to return to the Assisted Living.  CSW has faxed the requested information to [facility] and is waiting a return call with a decision.     Reason for Continuation of Hospitalization: Anxiety Depression Medication stabilization Suicidal ideation   Estimated Length of Stay:  1-7 days  Update 11/12/2021: TBD  Last 3 [place] Suicide Severity Risk Score: Flowsheet Row Admission (Current) from 11/06/2021 in [facility] ED from 11/04/2021 in [facility] ED to Hosp-Admission (Discharged) from 12/01/2020 in [facility] (1A)  C-SSRS RISK CATEGORY No Risk High Risk No Risk       Last PHQ 2/9 Scores:    08/26/2017   11:08 AM 05/26/2017   11:25 AM 04/01/2017   11:10 AM  Depression screen PHQ 2/9  Decreased Interest '1 1 1  '$ Down, Depressed, Hopeless '1 1 1  '$ PHQ - 2 Score '2 2 2  '$ Altered sleeping 0 1 1  Tired, decreased energy 0 1 0  Change in appetite 0 2 0  Feeling bad or failure about yourself  0 0 1  Trouble  concentrating 0 1 0  Moving slowly or fidgety/restless 2 1 0  Suicidal thoughts 0 0 0  PHQ-9 Score '4 8 4  '$ Difficult doing work/chores Not difficult at all Somewhat difficult Somewhat difficult    Scribe for Treatment Team: Rozann Lesches, Marlinda Mike 11/12/2021 8:36 AM

## 2021-11-12 NOTE — Progress Notes (Signed)
[facility] MD Progress Note  11/12/2021 11:43 AM Steve Ramirez  MRN:  [id number] Subjective: Steve Ramirez is seen on rounds.  He is doing much better.  He says he feels better.  He said he had a good conversation with his son last night.  He has been compliant with his medications.  No side effects.  He is looking forward to returning to the assisted living facility.  Social work and his son are trying to reassure his place of living that he is safe to return.  He denies any suicidal ideation and says his depression is better.  Principal Problem: Major depressive disorder, single episode, severe without psychosis ([place]) Diagnosis: Principal Problem:   Major depressive disorder, single episode, severe without psychosis ([place])  Total Time spent with patient: 15 minutes  Past Psychiatric History: None  Past Medical History:  Past Medical History:  Diagnosis Date   Coronary atherosclerosis of native coronary artery    Essential hypertension, benign    GIB (gastrointestinal bleeding)    Colonic diverticular bleed   History of melanoma    History of TIAs    Stroke ([place])    Type 2 diabetes mellitus ([place])     Past Surgical History:  Procedure Laterality Date   COLONOSCOPY  08/13/2010   Procedure: COLONOSCOPY;  Surgeon: Rogene Houston, MD;  [facility];  Service: Endoscopy;  Laterality: N/A;   JOINT REPLACEMENT     KNEE SURGERY     arthroscopic   LEFT HEART CATHETERIZATION WITH CORONARY ANGIOGRAM N/A 11/24/2010   Procedure: LEFT HEART CATHETERIZATION WITH CORONARY ANGIOGRAM;  Surgeon: Josue Hector, MD;  Location: [facility] CATH LAB;  Service: Cardiovascular;  Laterality: N/A;   neck fusion     years ago   TOTAL HIP ARTHROPLASTY Right 08/02/2018   Procedure: TOTAL HIP ARTHROPLASTY ANTERIOR APPROACH;  Surgeon: Renette Butters, MD;  Location: [facility] ORS;  Service: Orthopedics;  Laterality: Right;   TOTAL HIP ARTHROPLASTY Left 10/18/2018   Procedure: TOTAL HIP ARTHROPLASTY ANTERIOR APPROACH;   Surgeon: Renette Butters, MD;  Location: [facility] ORS;  Service: Orthopedics;  Laterality: Left;   Family History:  Family History  Problem Relation Age of Onset   Coronary artery disease Father    Asthma Other    Emphysema Mother    Coronary artery disease Paternal Uncle    Brain cancer Brother     Social History:  Social History   Substance and Sexual Activity  Alcohol Use Yes   Comment: occasionally     Social History   Substance and Sexual Activity  Drug Use No    Social History   Socioeconomic History   Marital status: Married    Spouse name: Not on file   Number of children: Not on file   Years of education: Not on file   Highest education level: Not on file  Occupational History   Occupation: retired  Tobacco Use   Smoking status: Former    Types: Cigarettes    Quit date: 01/13/1980    Years since quitting: 41.8   Smokeless tobacco: Never  Vaping Use   Vaping Use: Never used  Substance and Sexual Activity   Alcohol use: Yes    Comment: occasionally   Drug use: No   Sexual activity: Not Currently    Partners: Female  Other Topics Concern   Not on file  Social History Narrative   Lives with wife. Grew up in [place] and after college moved to [place]. Got married.  Moved to [place] and then to [place], [place].    Attends [employer].    Masters health education, administration   3 children   Caffeine- 1 pot coffee daily.   Daughter is a Education officer, museum. Son is a Higher education careers adviser. Son is in [place] and Recreation.       Social Determinants of Health   Financial Resource Strain: Not on file  Food Insecurity: No Food Insecurity (11/06/2021)   Hunger Vital Sign    Worried About Running Out of Food in the Last Year: Never true    Ran Out of Food in the Last Year: Never true  Transportation Needs: No Transportation Needs (11/06/2021)   PRAPARE - Hydrologist (Medical): No    Lack of Transportation (Non-Medical): No  Physical  Activity: Not on file  Stress: Not on file  Social Connections: Not on file   Additional Social History:                         Sleep: Good  Appetite:  Good  Current Medications: Current Facility-Administered Medications  Medication Dose Route Frequency Provider Last Rate Last Admin   acetaminophen (TYLENOL) tablet 650 mg  650 mg Oral Q6H PRN Patrecia Pour, NP       alum & mag hydroxide-simeth (MAALOX/MYLANTA) 200-200-20 MG/5ML suspension 30 mL  30 mL Oral Q4H PRN Patrecia Pour, NP       amLODipine (NORVASC) tablet 10 mg  10 mg Oral Daily Patrecia Pour, NP   10 mg at 11/12/21 0945   aspirin EC tablet 81 mg  81 mg Oral BID Patrecia Pour, NP   81 mg at 11/12/21 0945   cloNIDine (CATAPRES - Dosed in mg/24 hr) patch 0.3 mg  0.3 mg Transdermal Weekly Parks Ranger, DO   0.3 mg at 11/09/21 0955   clopidogrel (PLAVIX) tablet 75 mg  75 mg Oral Daily Patrecia Pour, NP   75 mg at 11/12/21 0946   cyanocobalamin (VITAMIN B12) tablet 1,000 mcg  1,000 mcg Oral Daily Patrecia Pour, NP   1,000 mcg at 11/12/21 0946   escitalopram (LEXAPRO) tablet 20 mg  20 mg Oral Daily Patrecia Pour, NP   20 mg at 11/12/21 0946   ferrous sulfate tablet 325 mg  325 mg Oral Q breakfast Patrecia Pour, NP   325 mg at 11/12/21 0817   isosorbide mononitrate (IMDUR) 24 hr tablet 30 mg  30 mg Oral QPM Patrecia Pour, NP   30 mg at 11/11/21 1724   levothyroxine (SYNTHROID) tablet 50 mcg  50 mcg Oral Daily Patrecia Pour, NP   50 mcg at 11/12/21 0614   lisinopril (ZESTRIL) tablet 40 mg  40 mg Oral Daily Patrecia Pour, NP   40 mg at 11/12/21 0945   magnesium hydroxide (MILK OF MAGNESIA) suspension 30 mL  30 mL Oral Daily PRN Patrecia Pour, NP       metFORMIN (GLUCOPHAGE) tablet 1,000 mg  1,000 mg Oral BID WC Patrecia Pour, NP   1,000 mg at 11/12/21 0817   metoprolol succinate (TOPROL-XL) 24 hr tablet 25 mg  25 mg Oral QPM Patrecia Pour, NP   25 mg at 11/11/21 1723   multivitamin with  minerals tablet 1 tablet  1 tablet Oral Daily Patrecia Pour, NP   1 tablet at 11/12/21 0945   pravastatin (PRAVACHOL) tablet 20 mg  20 mg  Oral QHS Patrecia Pour, NP   20 mg at 11/11/21 2130   risperiDONE (RISPERDAL) tablet 0.5 mg  0.5 mg Oral [id number] Parks Ranger, DO   0.5 mg at 11/12/21 4268   rOPINIRole (REQUIP) tablet 1 mg  1 mg Oral QHS Patrecia Pour, NP   1 mg at 11/11/21 2130   spironolactone (ALDACTONE) tablet 25 mg  25 mg Oral Daily Patrecia Pour, NP   25 mg at 11/12/21 3419    Lab Results: No results found for this or any previous visit (from the past 48 hour(s)).  Blood Alcohol level:  Lab Results  Component Value Date   ETH <10 11/04/2021   ETH <10 62/22/9798    Metabolic Disorder Labs: Lab Results  Component Value Date   HGBA1C 6.5 (H) 11/10/2020   MPG 139.85 11/10/2020   MPG 143 10/04/2020   No results found for: "PROLACTIN" Lab Results  Component Value Date   CHOL 82 11/10/2020   TRIG 113 11/10/2020   HDL 29 (L) 11/10/2020   CHOLHDL 2.8 11/10/2020   VLDL 23 11/10/2020   LDLCALC 30 11/10/2020   LDLCALC 61 10/04/2020    Physical Findings: AIMS:  , ,  ,  ,    CIWA:    COWS:     Musculoskeletal: Strength & Muscle Tone: within normal limits Gait & Station: normal Patient leans: N/A  Psychiatric Specialty Exam:  Presentation  General Appearance:  Appropriate for Environment  Eye Contact: Fair  Speech: Clear and Coherent  Speech Volume: Normal  Handedness: Right   Mood and Affect  Mood: Depressed; Hopeless  Affect: Blunt; Congruent   Thought Process  Thought Processes: Coherent  Descriptions of Associations:Intact  Orientation:Full (Time, Place and Person)  Thought Content:WDL; Rumination  History of Schizophrenia/Schizoaffective disorder:No data recorded Duration of Psychotic Symptoms:No data recorded Hallucinations:No data recorded Ideas of Reference:None  Suicidal Thoughts:No data recorded Homicidal  Thoughts:No data recorded  Sensorium  Memory: Immediate Good; Recent Good  Judgment: Poor  Insight: Fair   Community education officer  Concentration: Good  Attention Span: Good  Recall: Good  Fund of Knowledge: Good  Language: Fair   Psychomotor Activity  Psychomotor Activity:No data recorded  Assets  Assets: Communication Skills; Desire for Improvement; Financial Resources/Insurance; Housing; Social Support; Resilience   Sleep  Sleep:No data recorded   Physical Exam: Physical Exam Vitals and nursing note reviewed.  Constitutional:      Appearance: Normal appearance. He is normal weight.  Neurological:     General: No focal deficit present.     Mental Status: He is alert and oriented to person, place, and time.  Psychiatric:        Attention and Perception: Attention and perception normal.        Mood and Affect: Mood and affect normal.        Speech: Speech normal.        Behavior: Behavior normal. Behavior is cooperative.        Thought Content: Thought content normal.        Cognition and Memory: Cognition and memory normal.        Judgment: Judgment normal.    Review of Systems  Constitutional: Negative.   HENT: Negative.    Eyes: Negative.   Respiratory: Negative.    Cardiovascular: Negative.   Gastrointestinal: Negative.   Genitourinary: Negative.   Musculoskeletal: Negative.   Skin: Negative.   Neurological: Negative.   Endo/Heme/Allergies: Negative.   Psychiatric/Behavioral: Negative.     Blood  pressure (!) 119/50, pulse 62, temperature (!) 97.5 F (36.4 C), temperature source Oral, resp. rate 15, height '5\' 8"'$  (1.727 m), weight 82.3 kg, SpO2 97 %. Body mass index is 27.6 kg/m.   Treatment Plan Summary: Daily contact with patient to assess and evaluate symptoms and progress in treatment, Medication management, and Plan continue current medications.  Hopeful for discharge Friday.  Parks Ranger, DO 11/12/2021, 11:43 AM

## 2021-11-12 NOTE — BHH Counselor (Signed)
Patient stopped CSW in the hallway to report that he is not aligned with the plans to separate him and his wife once he returns to his Assisted Living.  He reports that he does not want to be separated from her.  He reports that he does not want to explain to their extended family why there is a separation.   He reports that he is aligned with a previous discussion that [name] had had, where it was decided who was going to care for the wife with certain tasks and when.  Assunta Curtis, MSW, LCSW 11/12/2021 2:38 PM

## 2021-11-12 NOTE — Progress Notes (Signed)
Patient is A+O x 4. He denies SI/HI/AVH. He denies depression and anxiety. Patient spent much time interacting with other patients in the dayroom by playing cards, watching tv, and conversing. He also made a couple of phone calls. Q15 minute unit checks in place.

## 2021-11-12 NOTE — Group Note (Signed)
[facility] LCSW Group Therapy Note   Group Date: 11/12/2021 Start Time: 2458 End Time: 1410   Type of Therapy/Topic:  Group Therapy:  Emotion Regulation  Participation Level:  Active   Mood:  Description of Group:    The purpose of this group is to assist patients in learning to regulate negative emotions and experience positive emotions. Patients will be guided to discuss ways in which they have been vulnerable to their negative emotions. These vulnerabilities will be juxtaposed with experiences of positive emotions or situations, and patients challenged to use positive emotions to combat negative ones. Special emphasis will be placed on coping with negative emotions in conflict situations, and patients will process healthy conflict resolution skills.  Therapeutic Goals: Patient will identify two positive emotions or experiences to reflect on in order to balance out negative emotions:  Patient will label two or more emotions that they find the most difficult to experience:  Patient will be able to demonstrate positive conflict resolution skills through discussion or role plays:   Summary of Patient Progress: Patient was present in group.  Patient was engaged and supportive of others.  Patient appeared to ruminate on thoughts of his wife, however, was able to bring the subject matter back to the topic at hand.  Patient was appropriate and displayed fair insight.     Therapeutic Modalities:   Cognitive Behavioral Therapy Feelings Identification Dialectical Behavioral Therapy   Rozann Lesches, LCSW

## 2021-11-13 DIAGNOSIS — F322 Major depressive disorder, single episode, severe without psychotic features: Secondary | ICD-10-CM | POA: Diagnosis not present

## 2021-11-13 NOTE — Progress Notes (Signed)
   11/13/21 1925  Psych Admission Type (Psych Patients Only)  Admission Status Involuntary  Psychosocial Assessment  Patient Complaints Sadness  Eye Contact Brief  Facial Expression Sad  Affect Appropriate to circumstance;Sad  Speech Logical/coherent  Interaction Assertive  Motor Activity Slow  Appearance/Hygiene Unremarkable  Behavior Characteristics Cooperative;Appropriate to situation  Mood Sad;Pleasant  Thought Process  Coherency WDL  Content Preoccupation (still worried about not being with his wife all day and living with his son instead of at [facility])  Delusions None reported or observed  Perception WDL  Hallucination None reported or observed  Judgment WDL  Confusion None  Danger to Self  Current suicidal ideation? Denies  Agreement Not to Harm Self Yes  Description of Agreement verbal  Danger to Others  Danger to Others None reported or observed   Progress note   D: Pt seen in his room. Pt denies SI, HI, AVH. Pt contracts for safety.  Pt rates pain  0/10. Pt rates anxiety  0/10 and depression  0/10. Pt said the one good thing about his day is that he will be discharged tomorrow. Still having misgivings about living with his son. "My children love me and would do anything for me, but they don't understand that my wife needs me. We have been together for 56 years. We are never apart except when, Vaughan Basta and her girlfriends would go to the beach for a week during the summer." Pt also wants the staff at [facility] to manage his medication. "They give me my pills when I am supposed to have them. They come and find me and make sure I take all my meds." Pt is open to the idea of living with his son but apprehensive. Still wants to be with his wife again at [facility]. Pt given encouragement and active listening. No other concerns noted at this time.  A: Pt provided support and encouragement. Pt given scheduled medication as prescribed. PRNs as appropriate. Q15 min checks for safety.    R: Pt safe on the unit. Will continue to monitor.

## 2021-11-13 NOTE — Progress Notes (Signed)
[facility] MD Progress Note  11/13/2021 1:30 PM Steve Ramirez  MRN:  [id number] Subjective: Steve Ramirez is seen on rounds.  He spoke with his son last night who informed him that he is not going back to the assisted living.  Nurses tell me that they denied him but he says that his son wants him to live with him.  His mood and affect are much improved.  He has been compliant with medications and no side effects.  He denies any suicidal ideation.  He is ready for discharge, hopefully tomorrow.  Principal Problem: Major depressive disorder, single episode, severe without psychosis ([place]) Diagnosis: Principal Problem:   Major depressive disorder, single episode, severe without psychosis ([place])  Total Time spent with patient: 15 minutes  Past Psychiatric History: None  Past Medical History:  Past Medical History:  Diagnosis Date   Coronary atherosclerosis of native coronary artery    Essential hypertension, benign    GIB (gastrointestinal bleeding)    Colonic diverticular bleed   History of melanoma    History of TIAs    Stroke ([place])    Type 2 diabetes mellitus ([place])     Past Surgical History:  Procedure Laterality Date   COLONOSCOPY  08/13/2010   Procedure: COLONOSCOPY;  Surgeon: Rogene Houston, MD;  [facility];  Service: Endoscopy;  Laterality: N/A;   JOINT REPLACEMENT     KNEE SURGERY     arthroscopic   LEFT HEART CATHETERIZATION WITH CORONARY ANGIOGRAM N/A 11/24/2010   Procedure: LEFT HEART CATHETERIZATION WITH CORONARY ANGIOGRAM;  Surgeon: Josue Hector, MD;  Location: [facility] CATH LAB;  Service: Cardiovascular;  Laterality: N/A;   neck fusion     years ago   TOTAL HIP ARTHROPLASTY Right 08/02/2018   Procedure: TOTAL HIP ARTHROPLASTY ANTERIOR APPROACH;  Surgeon: Renette Butters, MD;  Location: [facility];  Service: Orthopedics;  Laterality: Right;   TOTAL HIP ARTHROPLASTY Left 10/18/2018   Procedure: TOTAL HIP ARTHROPLASTY ANTERIOR APPROACH;  Surgeon: Renette Butters, MD;   [facility]: [facility];  Service: Orthopedics;  Laterality: Left;   Family History:  Family History  Problem Relation Age of Onset   Coronary artery disease Father    Asthma Other    Emphysema Mother    Coronary artery disease Paternal Uncle    Brain cancer Brother     Social History:  Social History   Substance and Sexual Activity  Alcohol Use Yes   Comment: occasionally     Social History   Substance and Sexual Activity  Drug Use No    Social History   Socioeconomic History   Marital status: Married    Spouse name: Not on file   Number of children: Not on file   Years of education: Not on file   Highest education level: Not on file  Occupational History   Occupation: retired  Tobacco Use   Smoking status: Former    Types: Cigarettes    Quit date: 01/13/1980    Years since quitting: 41.8   Smokeless tobacco: Never  Vaping Use   Vaping Use: Never used  Substance and Sexual Activity   Alcohol use: Yes    Comment: occasionally   Drug use: No   Sexual activity: Not Currently    Partners: Female  Other Topics Concern   Not on file  Social History Narrative   Lives with wife. Grew up in [place] and after college moved to [place]. Got married. Moved to [place] and then to [place],  [place].    Attends [employer].    Masters health education, administration   3 children   Caffeine- 1 pot coffee daily.   Daughter is a Education officer, museum. Son is a Higher education careers adviser. Son is in [place] and Recreation.       Social Determinants of Health   Financial Resource Strain: Not on file  Food Insecurity: No Food Insecurity (11/06/2021)   Hunger Vital Sign    Worried About Running Out of Food in the Last Year: Never true    Ran Out of Food in the Last Year: Never true  Transportation Needs: No Transportation Needs (11/06/2021)   PRAPARE - Hydrologist (Medical): No    Lack of Transportation (Non-Medical): No  Physical Activity: Not on file  Stress:  Not on file  Social Connections: Not on file   Additional Social History:                         Sleep: Good  Appetite:  Good  Current Medications: Current Facility-Administered Medications  Medication Dose Route Frequency Provider Last Rate Last Admin   acetaminophen (TYLENOL) tablet 650 mg  650 mg Oral Q6H PRN Patrecia Pour, NP       alum & mag hydroxide-simeth (MAALOX/MYLANTA) 200-200-20 MG/5ML suspension 30 mL  30 mL Oral Q4H PRN Patrecia Pour, NP       amLODipine (NORVASC) tablet 10 mg  10 mg Oral Daily Patrecia Pour, NP   10 mg at 11/13/21 2725   aspirin EC tablet 81 mg  81 mg Oral BID Patrecia Pour, NP   81 mg at 11/13/21 3664   cloNIDine (CATAPRES - Dosed in mg/24 hr) patch 0.3 mg  0.3 mg Transdermal Weekly Parks Ranger, DO   0.3 mg at 11/09/21 0955   clopidogrel (PLAVIX) tablet 75 mg  75 mg Oral Daily Patrecia Pour, NP   75 mg at 11/13/21 4034   cyanocobalamin (VITAMIN B12) tablet 1,000 mcg  1,000 mcg Oral Daily Patrecia Pour, NP   1,000 mcg at 11/13/21 7425   escitalopram (LEXAPRO) tablet 20 mg  20 mg Oral Daily Patrecia Pour, NP   20 mg at 11/13/21 9563   ferrous sulfate tablet 325 mg  325 mg Oral Q breakfast Patrecia Pour, NP   325 mg at 11/13/21 8756   isosorbide mononitrate (IMDUR) 24 hr tablet 30 mg  30 mg Oral QPM Patrecia Pour, NP   30 mg at 11/12/21 1626   levothyroxine (SYNTHROID) tablet 50 mcg  50 mcg Oral Daily Patrecia Pour, NP   50 mcg at 11/13/21 0539   lisinopril (ZESTRIL) tablet 40 mg  40 mg Oral Daily Patrecia Pour, NP   40 mg at 11/13/21 4332   magnesium hydroxide (MILK OF MAGNESIA) suspension 30 mL  30 mL Oral Daily PRN Patrecia Pour, NP       metFORMIN (GLUCOPHAGE) tablet 1,000 mg  1,000 mg Oral BID WC Patrecia Pour, NP   1,000 mg at 11/13/21 9518   metoprolol succinate (TOPROL-XL) 24 hr tablet 25 mg  25 mg Oral QPM Patrecia Pour, NP   25 mg at 11/12/21 1626   multivitamin with minerals tablet 1 tablet  1  tablet Oral Daily Patrecia Pour, NP   1 tablet at 11/13/21 8416   pravastatin (PRAVACHOL) tablet 20 mg  20 mg Oral QHS Patrecia Pour, NP  20 mg at 11/12/21 2101   risperiDONE (RISPERDAL) tablet 0.5 mg  0.5 mg Oral [id number] Parks Ranger, DO   0.5 mg at 11/13/21 1062   rOPINIRole (REQUIP) tablet 1 mg  1 mg Oral QHS Patrecia Pour, NP   1 mg at 11/12/21 2101   spironolactone (ALDACTONE) tablet 25 mg  25 mg Oral Daily Patrecia Pour, NP   25 mg at 11/13/21 6948    Lab Results: No results found for this or any previous visit (from the past 48 hour(s)).  Blood Alcohol level:  Lab Results  Component Value Date   ETH <10 11/04/2021   ETH <10 54/62/7035    Metabolic Disorder Labs: Lab Results  Component Value Date   HGBA1C 6.5 (H) 11/10/2020   MPG 139.85 11/10/2020   MPG 143 10/04/2020   No results found for: "PROLACTIN" Lab Results  Component Value Date   CHOL 82 11/10/2020   TRIG 113 11/10/2020   HDL 29 (L) 11/10/2020   CHOLHDL 2.8 11/10/2020   VLDL 23 11/10/2020   LDLCALC 30 11/10/2020   LDLCALC 61 10/04/2020    Physical Findings: AIMS:  , ,  ,  ,    CIWA:    COWS:     Musculoskeletal: Strength & Muscle Tone: within normal limits Gait & Station: normal Patient leans: N/A  Psychiatric Specialty Exam:  Presentation  General Appearance:  Appropriate for Environment  Eye Contact: Fair  Speech: Clear and Coherent  Speech Volume: Normal  Handedness: Right   Mood and Affect  Mood: Depressed; Hopeless  Affect: Blunt; Congruent   Thought Process  Thought Processes: Coherent  Descriptions of Associations:Intact  Orientation:Full (Time, Place and Person)  Thought Content:WDL; Rumination  History of Schizophrenia/Schizoaffective disorder:No data recorded Duration of Psychotic Symptoms:No data recorded Hallucinations:No data recorded Ideas of Reference:None  Suicidal Thoughts:No data recorded Homicidal Thoughts:No data  recorded  Sensorium  Memory: Immediate Good; Recent Good  Judgment: Poor  Insight: Fair   Community education officer  Concentration: Good  Attention Span: Good  Recall: Good  Fund of Knowledge: Good  Language: Fair   Psychomotor Activity  Psychomotor Activity:No data recorded  Assets  Assets: Communication Skills; Desire for Improvement; Financial Resources/Insurance; Housing; Social Support; Resilience   Sleep  Sleep:No data recorded   Physical Exam: Physical Exam Vitals and nursing note reviewed.  Constitutional:      Appearance: Normal appearance. He is normal weight.  Neurological:     General: No focal deficit present.     Mental Status: He is alert and oriented to person, place, and time.  Psychiatric:        Attention and Perception: Attention and perception normal.        Mood and Affect: Mood and affect normal.        Speech: Speech normal.        Behavior: Behavior normal. Behavior is cooperative.        Thought Content: Thought content normal.        Cognition and Memory: Cognition and memory normal.        Judgment: Judgment normal.    Review of Systems  Constitutional: Negative.   HENT: Negative.    Eyes: Negative.   Respiratory: Negative.    Cardiovascular: Negative.   Gastrointestinal: Negative.   Genitourinary: Negative.   Musculoskeletal: Negative.   Skin: Negative.   Neurological: Negative.   Endo/Heme/Allergies: Negative.   Psychiatric/Behavioral: Negative.     Blood pressure 122/62, pulse 70, temperature 98.6 F (37  C), resp. rate 18, height '5\' 8"'$  (1.727 m), weight 82.3 kg, SpO2 97 %. Body mass index is 27.6 kg/m.   Treatment Plan Summary: Daily contact with patient to assess and evaluate symptoms and progress in treatment, Medication management, and Plan continue current medications.  Hopeful for discharge tomorrow.  [place], DO 11/13/2021, 1:30 PM

## 2021-11-13 NOTE — Progress Notes (Signed)
Patient denies SI, HI, and AVH. He denies pain and other physical problems. Patient states that he just wants to go home. Patient is accepting of the fact he will have to live with his son instead of going back to the assisted living. Patient given support and encouragement. Patient is compliant with scheduled medications. He is observed to interact appropriately with staff and other patients. Patient remains safe on the unit at this time.

## 2021-11-13 NOTE — Progress Notes (Signed)
   11/12/21 2115  Psych Admission Type (Psych Patients Only)  Admission Status Involuntary  Psychosocial Assessment  Patient Complaints Sadness;Anxiety  Eye Contact Fair  Facial Expression Sad  Affect Appropriate to circumstance  Speech Logical/coherent  Interaction Assertive  Motor Activity Slow  Appearance/Hygiene Unremarkable;In scrubs  Behavior Characteristics Cooperative;Appropriate to situation  Mood Sad;Pleasant  Thought Process  Coherency WDL  Content WDL  Delusions None reported or observed  Perception WDL  Hallucination None reported or observed  Judgment WDL  Confusion None  Danger to Self  Current suicidal ideation? Denies  Agreement Not to Harm Self Yes  Description of Agreement verbal  Danger to Others  Danger to Others None reported or observed   Progress note   D: Pt seen in dayroom interacting with peers. Pt denies SI, HI, AVH. Pt contracts for safety. Pt rates pain  0/10. Pt rates anxiety  5/10 and depression  0/10. Pt still worried about his wife and the fact that he is not returning to the ALF. "My son said that I would live with him. I told him I would think about it." Spoke at length with pt about his wife's daily routine and the fact that pt is not able to do all that he did previously for her. "I need to now where I fit in to her day now." Pt encouraged to try living with his son as a way to relieve stress from constantly caring for his wife. Pt worried about what others in the family will say of his overdose. Pt encouraged to focus on getting better and spending quality time with his wife when he is discharged. No other concerns noted at this time.  A: Pt provided support and encouragement. Pt given scheduled medication as prescribed. PRNs as appropriate. Q15 min checks for safety.   R: Pt safe on the unit. Will continue to monitor.

## 2021-11-14 DIAGNOSIS — F322 Major depressive disorder, single episode, severe without psychotic features: Secondary | ICD-10-CM | POA: Diagnosis not present

## 2021-11-14 MED ORDER — FERROUS SULFATE 325 (65 FE) MG PO TABS: 325.0000 mg | ORAL_TABLET | Freq: Every day | ORAL | 3 refills | Status: AC

## 2021-11-14 MED ORDER — RISPERIDONE 0.5 MG PO TABS: 0.5000 mg | ORAL_TABLET | ORAL | 3 refills | Status: AC

## 2021-11-14 MED ORDER — AMLODIPINE BESYLATE 10 MG PO TABS: 10.0000 mg | ORAL_TABLET | Freq: Every day | ORAL | 3 refills | Status: AC

## 2021-11-14 MED ORDER — VITAMIN B-12 1000 MCG PO TABS: 1000.0000 ug | ORAL_TABLET | Freq: Every day | ORAL | 0 refills | Status: AC

## 2021-11-14 MED ORDER — CLONIDINE 0.3 MG/24HR TD PTWK: 0.3000 mg | MEDICATED_PATCH | TRANSDERMAL | 12 refills | Status: AC

## 2021-11-14 MED ORDER — ISOSORBIDE MONONITRATE ER 30 MG PO TB24: 30.0000 mg | ORAL_TABLET | Freq: Every evening | ORAL | 3 refills | Status: AC

## 2021-11-14 MED ORDER — ROPINIROLE HCL 0.5 MG PO TABS: 1.0000 mg | ORAL_TABLET | Freq: Every day | ORAL | 0 refills | Status: DC

## 2021-11-14 MED ORDER — METOPROLOL SUCCINATE ER 25 MG PO TB24: 25.0000 mg | ORAL_TABLET | Freq: Every evening | ORAL | 3 refills | Status: DC

## 2021-11-14 MED ORDER — NITROGLYCERIN 0.4 MG SL SUBL: 0.4000 mg | SUBLINGUAL_TABLET | SUBLINGUAL | 12 refills | Status: AC | PRN

## 2021-11-14 MED ORDER — SPIRONOLACTONE 25 MG PO TABS: 25.0000 mg | ORAL_TABLET | Freq: Every day | ORAL | 3 refills | Status: DC

## 2021-11-14 MED ORDER — PRAVASTATIN SODIUM 20 MG PO TABS: 20.0000 mg | ORAL_TABLET | Freq: Every day | ORAL | 3 refills | Status: DC

## 2021-11-14 MED ORDER — ADULT MULTIVITAMIN W/MINERALS CH: 1.0000 | ORAL_TABLET | Freq: Every day | ORAL | 3 refills | Status: AC

## 2021-11-14 MED ORDER — LISINOPRIL 40 MG PO TABS: 40.0000 mg | ORAL_TABLET | Freq: Every day | ORAL | 3 refills | Status: AC

## 2021-11-14 MED ORDER — METFORMIN HCL 1000 MG PO TABS: 1000.0000 mg | ORAL_TABLET | Freq: Two times a day (BID) | ORAL | 3 refills | Status: AC

## 2021-11-14 MED ORDER — CLOPIDOGREL BISULFATE 75 MG PO TABS: 75.0000 mg | ORAL_TABLET | Freq: Every day | ORAL | 3 refills | Status: AC

## 2021-11-14 MED ORDER — LEVOTHYROXINE SODIUM 50 MCG PO TABS: 50.0000 ug | ORAL_TABLET | Freq: Every day | ORAL | 0 refills | Status: AC

## 2021-11-14 MED ORDER — ESCITALOPRAM OXALATE 20 MG PO TABS: 20.0000 mg | ORAL_TABLET | Freq: Every day | ORAL | 3 refills | Status: AC

## 2021-11-14 NOTE — Progress Notes (Signed)
Discharge Note:  Patient denies SI/HI/AVH at this time. Discharge instructions, AVS, prescriptions, and transition record gone over with patient. Patient agrees to comply with medication management, follow-up visit, and outpatient therapy. Patient belongings returned to patient. Patient questions and concerns addressed and answered. Patient ambulatory off unit. Patient discharged to son's house with daughter-in-law.

## 2021-11-14 NOTE — Discharge Summary (Signed)
Physician Discharge Summary Note  Patient:  Steve Ramirez is an 86 y.o., male MRN:  [id number] DOB:  1935-12-26 Patient phone:  [phone] (home)  Patient address:   [place] [address],  Total Time spent with patient: 1 hour  Date of Admission:  11/06/2021 Date of Discharge: 11/14/2021  Reason for Admission:  Steve Ramirez is a 86 y.o. male patient presented to [facility] via EMS from [facility] facility under involuntary commitment status (IVC). The patient reports severe depression and reports he took a handful of medication, which he estimates 75 tablets of 200 mg ibuprofen at 2100. The patient shared that he lives at the facility with his wife, who has dementia. He shared that the disease has worsened in the past six months. He and his wife have been together for 72 years, and his wife's illness has taken a toll on him. He shared that even though he and his wife reside in a nursing facility, his wife refused to let the nursing staff assist her. He said his wife does not listen to him when he tries to help her. The patient shared that he feels overwhelmed and exhausted. The patient stated, "I should know better. 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 I have had 16 strokes." The patient shared he became depressed, and he was not thinking, and intentionally ingested 75 ibuprofen tablets.    Principal Problem: Major depressive disorder, single episode, severe without psychosis ([place]) Discharge Diagnoses: Principal Problem:   Major depressive disorder, single episode, severe without psychosis ([place])   Past Psychiatric History: None  Past Medical History:  Past Medical History:  Diagnosis Date   Coronary atherosclerosis of native coronary artery    Essential hypertension, benign    GIB (gastrointestinal bleeding)    Colonic diverticular bleed   History of melanoma     History of TIAs    Stroke ([place])    Type 2 diabetes mellitus ([place])     Past Surgical History:  Procedure Laterality Date   COLONOSCOPY  08/13/2010   Procedure: COLONOSCOPY;  Surgeon: Rogene Houston, MD;  Location: [facility];  Service: Endoscopy;  Laterality: N/A;   JOINT REPLACEMENT     KNEE SURGERY     arthroscopic   LEFT HEART CATHETERIZATION WITH CORONARY ANGIOGRAM N/A 11/24/2010   Procedure: LEFT HEART CATHETERIZATION WITH CORONARY ANGIOGRAM;  Surgeon: Josue Hector, MD;  Location: [facility] CATH LAB;  Service: Cardiovascular;  Laterality: N/A;   neck fusion     years ago   TOTAL HIP ARTHROPLASTY Right 08/02/2018   Procedure: TOTAL HIP ARTHROPLASTY ANTERIOR APPROACH;  Surgeon: Renette Butters, MD;  Location: [facility] ORS;  Service: Orthopedics;  Laterality: Right;   TOTAL HIP ARTHROPLASTY Left 10/18/2018   Procedure: TOTAL HIP ARTHROPLASTY ANTERIOR APPROACH;  Surgeon: Renette Butters, MD;  Location: [facility] ORS;  Service: Orthopedics;  Laterality: Left;   Family History:  Family History  Problem Relation Age of Onset   Coronary artery disease Father    Asthma Other    Emphysema Mother    Coronary artery disease Paternal Uncle    Brain cancer Brother    Family Psychiatric  History: Unremarkable Social History:  Social History   Substance and Sexual Activity  Alcohol Use Yes   Comment: occasionally     Social History   Substance and Sexual Activity  Drug Use No  Social History   Socioeconomic History   Marital status: Married    Spouse name: Not on file   Number of children: Not on file   Years of education: Not on file   Highest education level: Not on file  Occupational History   Occupation: retired  Tobacco Use   Smoking status: Former    Types: Cigarettes    Quit date: 01/13/1980    Years since quitting: 41.8   Smokeless tobacco: Never  Vaping Use   Vaping Use: Never used  Substance and Sexual Activity   Alcohol use: Yes    Comment: occasionally   Drug use: No    Sexual activity: Not Currently    Partners: Female  Other Topics Concern   Not on file  Social History Narrative   Lives with wife. Grew up in [place] and after college moved to [place]. Got married. Moved to [place] and then to [place], [place].    Attends [employer].    Masters health education, administration   3 children   Caffeine- 1 pot coffee daily.   Daughter is a Education officer, museum. Son is a Higher education careers adviser. Son is in [place] [employer].       Social Determinants of Health   Financial Resource Strain: Not on file  Food Insecurity: No Food Insecurity (11/06/2021)   Hunger Vital Sign    Worried About Running Out of Food in the Last Year: Never true    Ran Out of Food in the Last Year: Never true  Transportation Needs: No Transportation Needs (11/06/2021)   PRAPARE - Hydrologist (Medical): No    Lack of Transportation (Non-Medical): No  Physical Activity: Not on file  Stress: Not on file  Social Connections: Not on file    Hospital Course: Benjamin is a 86 year old white male who was involuntarily admitted to geriatric psychiatry after an overdose.  He took ibuprofen.  He was feeling overwhelmed at his assisted living facility taking care of his wife.  He states that she has dementia.  When he learned that his wife was doing fine without him that made him more depressed and tearful.  He had a hard time understanding that the staff was perfectly suited to take care of her.  He felt like it was his duty.  He was becoming overwhelmed with that and became hopeless and impulsively took an overdose.  While on the unit he was continued on his Lexapro and his home medications.  Due to his negative thinking he was started on Risperdal 0.5 mg twice a day.  He started to do much better on this and his mood improved.  He was pleasant and cooperative on the unit and participated in groups.  He interacted well with staff and peers.  He denied any side effects  from his medications and there was no evidence of EPS or TD.  When he found out that the assisted living facility would not have him return he handled this pretty well.  His son is very involved and he is going to live with him.  He denied suicidal ideation and regretted the overdose.  He maximized hospitalization and he was discharged home.  On the day of discharge he denied suicidal ideation, homicidal ideation, auditory or visual hallucinations.  Judgment and insight were good.  Physical Findings: AIMS:  , ,  ,  ,    CIWA:    COWS:     Musculoskeletal: Strength & Muscle Tone: within normal  limits Gait & Station: normal Patient leans: N/A   Psychiatric Specialty Exam:  Presentation  General Appearance:  Appropriate for Environment  Eye Contact: Fair  Speech: Clear and Coherent  Speech Volume: Normal  Handedness: Right   Mood and Affect  Mood: Depressed; Hopeless  Affect: Blunt; Congruent   Thought Process  Thought Processes: Coherent  Descriptions of Associations:Intact  Orientation:Full (Time, Place and Person)  Thought Content:WDL; Rumination  History of Schizophrenia/Schizoaffective disorder:No data recorded Duration of Psychotic Symptoms:No data recorded Hallucinations:No data recorded Ideas of Reference:None  Suicidal Thoughts:No data recorded Homicidal Thoughts:No data recorded  Sensorium  Memory: Immediate Good; Recent Good  Judgment: Poor  Insight: Fair   Community education officer  Concentration: Good  Attention Span: Good  Recall: Good  Fund of Knowledge: Good  Language: Fair   Psychomotor Activity  Psychomotor Activity:No data recorded  Assets  Assets: Communication Skills; Desire for Improvement; Financial Resources/Insurance; Housing; Social Support; Resilience   Sleep  Sleep:No data recorded   Physical Exam: Physical Exam Vitals and nursing note reviewed.  Constitutional:      Appearance: Normal appearance.  He is normal weight.  Neurological:     General: No focal deficit present.     Mental Status: He is alert and oriented to person, place, and time.  Psychiatric:        Attention and Perception: Attention and perception normal.        Mood and Affect: Mood and affect normal.        Speech: Speech normal.        Behavior: Behavior normal. Behavior is cooperative.        Thought Content: Thought content normal.        Cognition and Memory: Cognition and memory normal.        Judgment: Judgment normal.    Review of Systems  Constitutional: Negative.   HENT: Negative.    Eyes: Negative.   Respiratory: Negative.    Cardiovascular: Negative.   Gastrointestinal: Negative.   Genitourinary: Negative.   Musculoskeletal: Negative.   Skin: Negative.   Neurological: Negative.   Endo/Heme/Allergies: Negative.   Psychiatric/Behavioral: Negative.     Blood pressure (!) 150/74, pulse 73, temperature 97.6 F (36.4 C), temperature source Oral, resp. rate 20, height '5\' 8"'$  (1.727 m), weight 82.3 kg, SpO2 99 %. Body mass index is 27.6 kg/m.   Social History   Tobacco Use  Smoking Status Former   Types: Cigarettes   Quit date: 01/13/1980   Years since quitting: 41.8  Smokeless Tobacco Never   Tobacco Cessation:  N/A, patient does not currently use tobacco products   Blood Alcohol level:  Lab Results  Component Value Date   ETH <10 11/04/2021   ETH <10 50/53/9767    Metabolic Disorder Labs:  Lab Results  Component Value Date   HGBA1C 6.5 (H) 11/10/2020   MPG 139.85 11/10/2020   MPG 143 10/04/2020   No results found for: "PROLACTIN" Lab Results  Component Value Date   CHOL 82 11/10/2020   TRIG 113 11/10/2020   HDL 29 (L) 11/10/2020   CHOLHDL 2.8 11/10/2020   VLDL 23 11/10/2020   LDLCALC 30 11/10/2020   LDLCALC 61 10/04/2020    See Psychiatric Specialty Exam and Suicide Risk Assessment completed by Attending Physician prior to discharge.  Discharge destination:  Home  Is  patient on multiple antipsychotic therapies at discharge:  No   Has Patient had three or more failed trials of antipsychotic monotherapy by history:  No  Recommended Plan for Multiple Antipsychotic Therapies: NA   Allergies as of 11/14/2021   No Known Allergies      Medication List     STOP taking these medications    acetaminophen 325 MG tablet Commonly known as: TYLENOL       TAKE these medications      Indication  amLODipine 10 MG tablet Commonly known as: NORVASC Take 1 tablet (10 mg total) by mouth daily.  Indication: High Blood Pressure Disorder   aspirin EC 81 MG tablet Take 1 tablet (81 mg total) by mouth 2 (two) times daily. For DVT prophylaxis for 30 days after surgery.    betamethasone dipropionate 0.05 % cream Apply 1 Application topically 2 (two) times daily as needed.    cetirizine 10 MG tablet Commonly known as: ZYRTEC Take 10 mg by mouth daily.    cloNIDine 0.3 mg/24hr patch Commonly known as: CATAPRES - Dosed in mg/24 hr Place 1 patch (0.3 mg total) onto the skin once a week. Start taking on: November 16, 2021 What changed: when to take this    clopidogrel 75 MG tablet Commonly known as: PLAVIX Take 1 tablet (75 mg total) by mouth daily. Start taking on: November 15, 2021  Indication: Cerebrovascular Accident or Stroke, Heart Attack   cyanocobalamin 1000 MCG tablet Commonly known as: VITAMIN B12 Take 1 tablet (1,000 mcg total) by mouth daily.  Indication: Inadequate Vitamin B12   escitalopram 20 MG tablet Commonly known as: LEXAPRO Take 1 tablet (20 mg total) by mouth daily.  Indication: Major Depressive Disorder   ferrous sulfate 325 (65 FE) MG tablet Take 1 tablet (325 mg total) by mouth daily with breakfast.  Indication: Iron Deficiency   Fish Oil 1000 MG Caps Take 1,000 mg by mouth at bedtime.    ipratropium-albuterol 0.5-2.5 (3) MG/3ML Soln Commonly known as: DUONEB Take 3 mLs by nebulization every 4 (four) hours as needed.     isosorbide mononitrate 30 MG 24 hr tablet Commonly known as: IMDUR Take 1 tablet (30 mg total) by mouth every evening.  Indication: Stable Angina Pectoris   levothyroxine 50 MCG tablet Commonly known as: SYNTHROID Take 1 tablet (50 mcg total) by mouth daily.  Indication: Underactive Thyroid   lisinopril 40 MG tablet Commonly known as: ZESTRIL Take 1 tablet (40 mg total) by mouth daily.  Indication: High Blood Pressure Disorder   metFORMIN 1000 MG tablet Commonly known as: GLUCOPHAGE Take 1 tablet (1,000 mg total) by mouth 2 (two) times daily with a meal. What changed: when to take this  Indication: Type 2 Diabetes   metoprolol succinate 25 MG 24 hr tablet Commonly known as: TOPROL-XL Take 1 tablet (25 mg total) by mouth every evening.  Indication: High Blood Pressure Disorder   multivitamin with minerals Tabs tablet Take 1 tablet by mouth daily. Start taking on: November 15, 2021    nitroGLYCERIN 0.4 MG SL tablet Commonly known as: NITROSTAT Place 1 tablet (0.4 mg total) under the tongue every 5 (five) minutes as needed for chest pain.  Indication: Acute Angina Pectoris   pravastatin 20 MG tablet Commonly known as: PRAVACHOL Take 1 tablet (20 mg total) by mouth at bedtime.  Indication: High Amount of Fats in the Blood   risperiDONE 0.5 MG tablet Commonly known as: RISPERDAL Take 1 tablet (0.5 mg total) by mouth 2 (two) times daily at 8 am and 4 pm.  Indication: Major Depressive Disorder   rOPINIRole 0.5 MG tablet Commonly known as: REQUIP Take 2  tablets (1 mg total) by mouth at bedtime.  Indication: Restless Leg Syndrome   spironolactone 25 MG tablet Commonly known as: ALDACTONE Take 1 tablet (25 mg total) by mouth daily. Start taking on: November 15, 2021  Indication: High Blood Pressure Disorder        Follow-up Information     [facility] Follow up.   Specialty: Behavioral Health Why: This provider offers medicaion  management at this time. Contact information: [place] [facility] Follow up.   Why: This provider offers both therapy as well as medication management. Please call if you have any additional questions or would like to make them your provider. Contact information: [place] [address] [place], [facility] Follow up.   Why: This provider offers both therapy as well as medication management. Please call if you have any additional questions or would like to make them your provider. Contact information: [address] [place] [address] [phone]         [facility] Follow up.   Why: This provider offers both therapy as well as medication management.  Please call if you have any additional questions or would like to make them your provider. Contact information: [address] [place], [place], [place] [address] PHONE: [phone] FAX: [phone] Monday - Friday 8:30am - 5:30pm ** Lunch (12:30pm - 1:30pm)  Saturday 10:00am - 4:00pm  Sunday Closed                Follow-up recommendations:  [facility]    Signed: Parks Ranger, DO 11/14/2021, 12:42 PM

## 2021-11-14 NOTE — Plan of Care (Signed)
  Problem: Health Behavior/Discharge Planning: Goal: Ability to manage health-related needs will improve Outcome: Progressing   Problem: Clinical Measurements: Goal: Will remain free from infection Outcome: Progressing   Problem: Nutrition: Goal: Adequate nutrition will be maintained Outcome: Progressing   Problem: Coping: Goal: Level of anxiety will decrease Outcome: Progressing   Problem: Pain Managment: Goal: General experience of comfort will improve Outcome: Progressing   Problem: Safety: Goal: Ability to remain free from injury will improve Outcome: Progressing   Problem: Education: Goal: Emotional status will improve Outcome: Progressing Goal: Mental status will improve Outcome: Progressing Goal: Verbalization of understanding the information provided will improve Outcome: Progressing   Problem: Activity: Goal: Interest or engagement in activities will improve Outcome: Progressing Goal: Sleeping patterns will improve Outcome: Progressing   Problem: Coping: Goal: Ability to verbalize frustrations and anger appropriately will improve Outcome: Progressing   Problem: Safety: Goal: Periods of time without injury will increase Outcome: Progressing   Problem: Coping: Goal: Coping ability will improve Outcome: Progressing   Problem: Medication: Goal: Compliance with prescribed medication regimen will improve Outcome: Progressing

## 2021-11-14 NOTE — Progress Notes (Signed)
Patient with appropriate affect.  Alert and oriented x 4.  Patient denies SI/HI, AVH and pain.  Denies anxiety and depression at this time.  Patient is compliant with scheduled medications.  Patient is happy to be discharging today with is son.   15 min checks in place.  Patient is safe on the unit.  Appropriate interaction with peers.  Patient is present in the milieu.

## 2021-11-14 NOTE — Progress Notes (Signed)
  [facility] Adult Case Management Discharge Plan :  Will you be returning to the same living situation after discharge:  No. At discharge, do you have transportation home?: Yes,  pt reports that son will provide transportation. Do you have the ability to pay for your medications: Yes,  [facility] MEDICARE  Release of information consent forms completed and in the chart;  Patient's signature needed at discharge.  Patient to Follow up at:  Follow-up Information     [facility] Follow up.   Specialty: Behavioral Health Why: This provider offers medicaion management at this time. Contact information: [facility] Follow up.   Why: This provider offers both therapy as well as medication management. Please call if you have any additional questions or would like to make them your provider. Contact information: [place] [address] [place], [facility] Follow up.   Why: This provider offers both therapy as well as medication management. Please call if you have any additional questions or would like to make them your provider. Contact information: [address] [place] [address] [phone]         [facility] Follow up.   Why: This provider offers both therapy as well as medication management.  Please call if you have any additional questions or would like to make them your provider. Contact information: [address] [place], [place], [place] [address] PHONE: [phone] FAX: [phone] Monday - Friday 8:30am - 5:30pm ** Lunch (12:30pm - 1:30pm)  Saturday 10:00am - 4:00pm  Sunday Closed                Next level of care provider has access to [place] and Suicide  Prevention discussed: Yes,  SPE complete with the patient's son.     Has patient been referred to the Quitline?: N/A patient is not a smoker  Patient has been referred for addiction treatment: [place], LCSW 11/14/2021, 9:51 AM

## 2021-11-14 NOTE — Care Management Important Message (Signed)
Important Message  Patient Details  Name: Steve Ramirez MRN: [id number] Date of Birth: 08/28/1935   Medicare Important Message Given:  Yes     Rozann Lesches, LCSW 11/14/2021, 9:53 AM

## 2021-11-14 NOTE — [facility] Suicide Risk Assessment (Signed)
[facility] Discharge Suicide Risk Assessment   Principal Problem: Major depressive disorder, single episode, severe without psychosis ([place]) Discharge Diagnoses: Principal Problem:   Major depressive disorder, single episode, severe without psychosis ([place])   Total Time spent with patient: 15 minutes  Musculoskeletal: Strength & Muscle Tone: within normal limits Gait & Station: normal Patient leans: N/A  Psychiatric Specialty Exam  Presentation  General Appearance:  Appropriate for Environment  Eye Contact: Fair  Speech: Clear and Coherent  Speech Volume: Normal  Handedness: Right   Mood and Affect  Mood: Depressed; Hopeless  Duration of Depression Symptoms: No data recorded Affect: Blunt; Congruent   Thought Process  Thought Processes: Coherent  Descriptions of Associations:Intact  Orientation:Full (Time, Place and Person)  Thought Content:WDL; Rumination  History of Schizophrenia/Schizoaffective disorder:No data recorded Duration of Psychotic Symptoms:No data recorded Hallucinations:No data recorded Ideas of Reference:None  Suicidal Thoughts:No data recorded Homicidal Thoughts:No data recorded  Sensorium  Memory: Immediate Good; Recent Good  Judgment: Poor  Insight: Fair   Community education officer  Concentration: Good  Attention Span: Good  Recall: Good  Fund of Knowledge: Good  Language: Fair   Psychomotor Activity  Psychomotor Activity:No data recorded  Assets  Assets: Communication Skills; Desire for Improvement; Financial Resources/Insurance; Housing; Social Support; Resilience   Sleep  Sleep:No data recorded   Blood pressure (!) 150/74, pulse 73, temperature 97.6 F (36.4 C), temperature source Oral, resp. rate 20, height '5\' 8"'$  (1.727 m), weight 82.3 kg, SpO2 99 %. Body mass index is 27.6 kg/m.  Mental Status Per Nursing Assessment::   On Admission:     Demographic Factors:  Male, Age 84 or older, and  Caucasian  Loss Factors: NA  Historical Factors: Prior suicide attempts  Risk Reduction Factors:   Sense of responsibility to family, Living with another person, especially a relative, Positive social support, Positive therapeutic relationship, and Positive coping skills or problem solving skills   Cognitive Features That Contribute To Risk:  Polarized thinking    Suicide Risk:  Minimal: No identifiable suicidal ideation.  Patients presenting with no risk factors but with morbid ruminations; may be classified as minimal risk based on the severity of the depressive symptoms   Follow-up Information     [facility] Follow up.   Specialty: Behavioral Health Why: This provider offers medicaion management at this time. Contact information: [facility] [place] Follow up.   Why: This provider offers both therapy as well as medication management. Please call if you have any additional questions or would like to make them your provider. Contact information: [place] [address] [place] Follow up.   Why: This provider offers both therapy as well as medication management. Please call if you have any additional questions or would like to make them your provider. Contact information: [address] [place] [address] [phone]         [facility] Follow up.   Why: This provider offers both therapy as well as medication management.  Please call if you have any additional questions or would like to make them your provider. Contact information: [address], [place], [place], [place] [address] PHONE: [phone] FAX: [phone] Monday - Friday 8:30am - 5:30pm ** Lunch (12:30pm - 1:30pm)  Saturday 10:00am -  4:00pm  Sunday Closed                Plan Of Care/Follow-up recommendations: Mind[facility] Donaldson, DO 11/14/2021, 12:18 PM

## 2021-11-14 NOTE — [facility] Counselor (Signed)
CSW met with the patient to go over discharge planning. Patient reported that he would return to Assisted Living at [facility] as an option. CSW spoke with patient that per patient's son, he had previously told the patient that he was not going to be able to return.  Patient seemed confused and stated that he did not think that this was confirmed.   CSW asked if patient would like for her to call and confirm and patient stated that he would.  CSW called [name] and spoke with someone who introduced themselves as Juliann Pulse and [place], whom CSW had spoken with previously.  Per Tiffany patient can NOT return.  CSW has informed patient.  Assunta Curtis, MSW, LCSW 11/14/2021 9:55 AM

## 2022-07-30 IMAGING — CT CT HEAD W/O CM
4 series · 17 of 47 positions shown, 19 images · non-contrast
Comparison: 11/09/2020

CLINICAL DATA: Altered mental status

EXAM:
CT HEAD WITHOUT CONTRAST
TECHNIQUE: Contiguous axial images were obtained from the base of the skull
through the vertex without intravenous contrast.

[Series 2: head wo · axial · 0.49mm/px · z∈[-151,-21]mm · 7 of 36 slices shown, 9 images]
[im 5/36  brain]
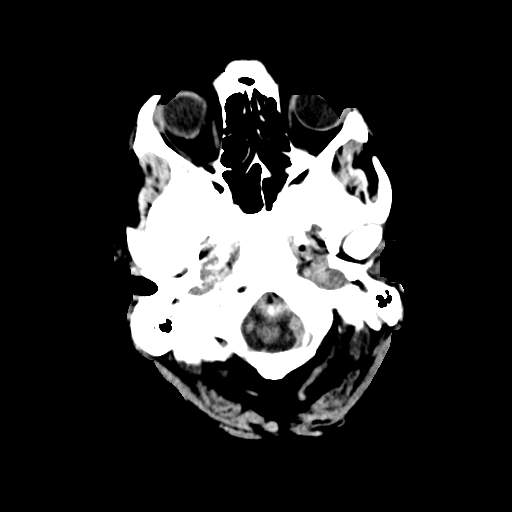
[im 5/36  bone]
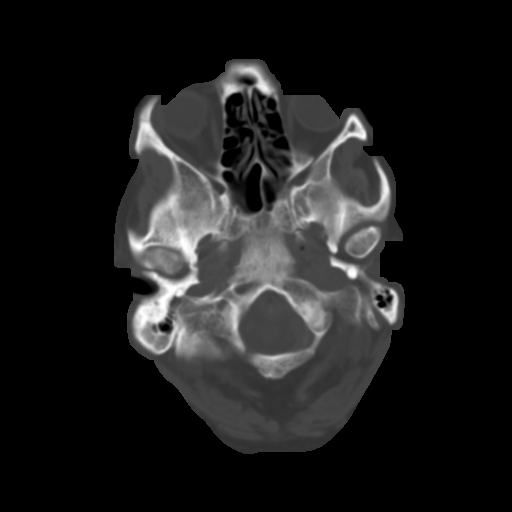
[im 9/36  brain]
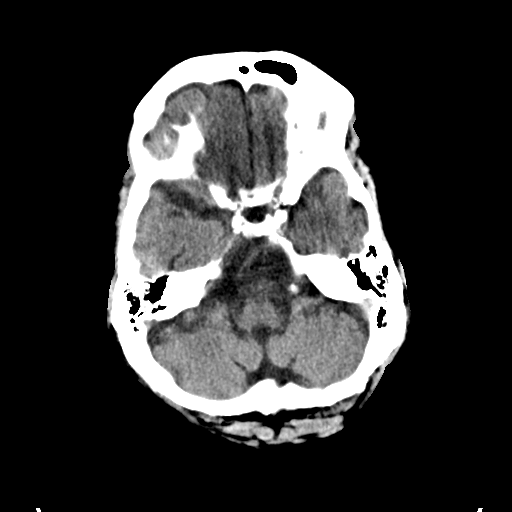
[im 14/36  brain]
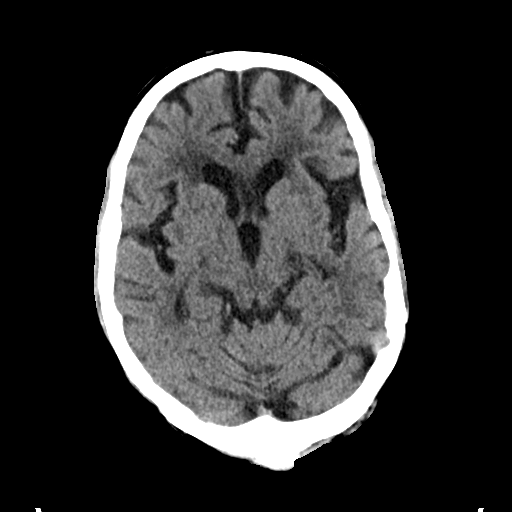
[im 18/36  brain]
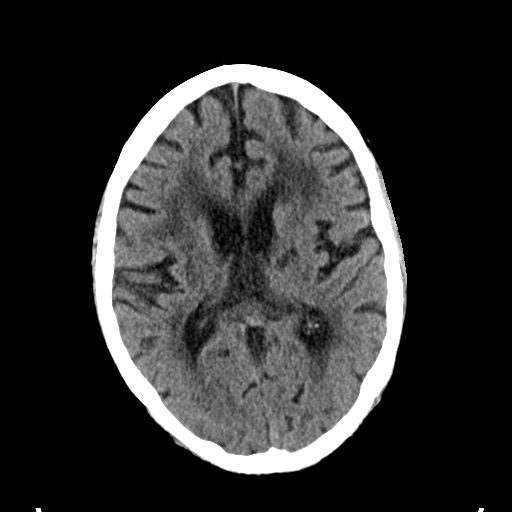
[im 22/36  brain]
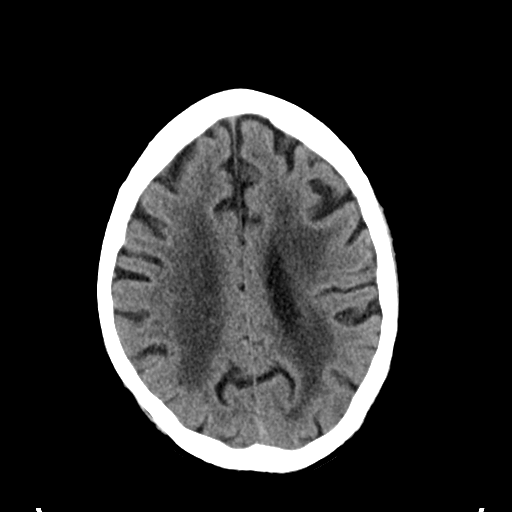
[im 22/36  bone]
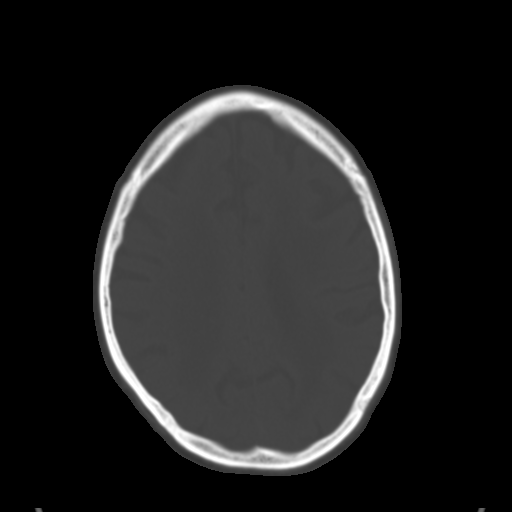
[im 27/36  brain]
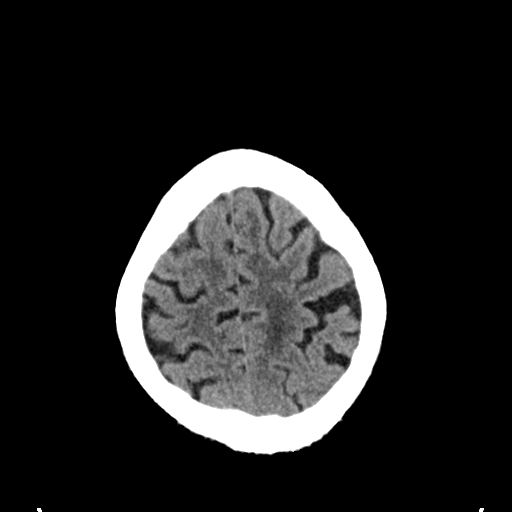
[im 31/36  brain]
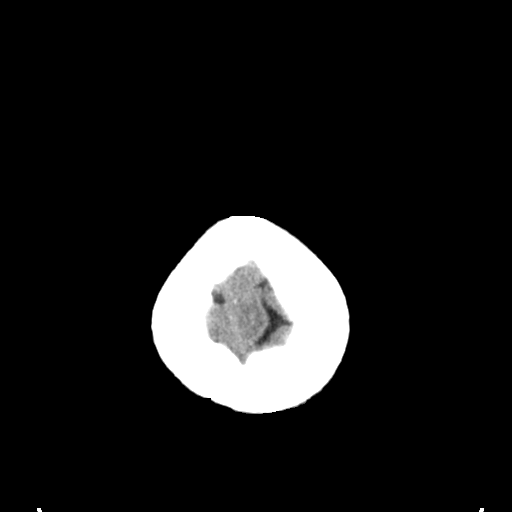

[Series 3: head bone · axial · 0.49mm/px · z∈[-155,-91]mm · 4 of 90 slices shown]
[im 9/90  bone]
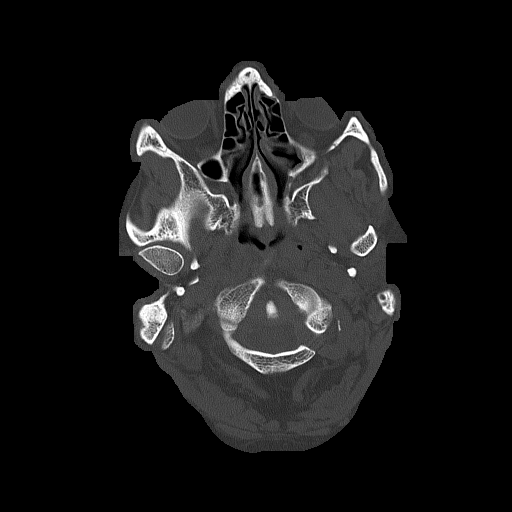
[im 18/90  bone]
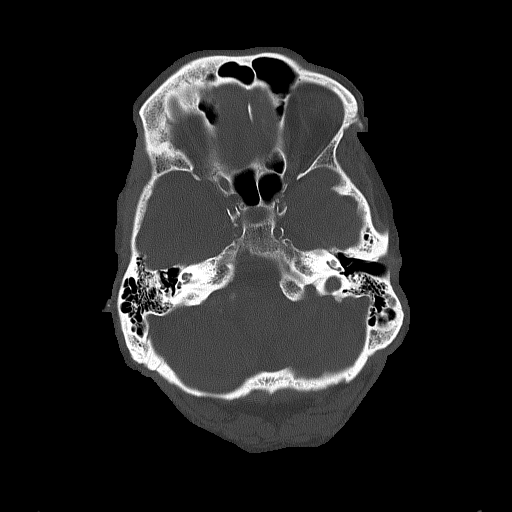
[im 27/90  bone]
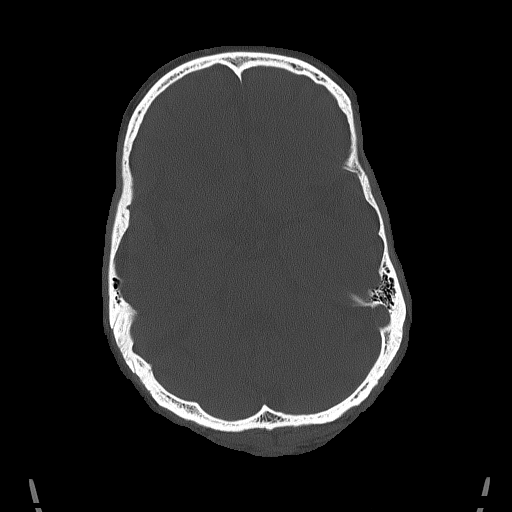
[im 41/90  bone]
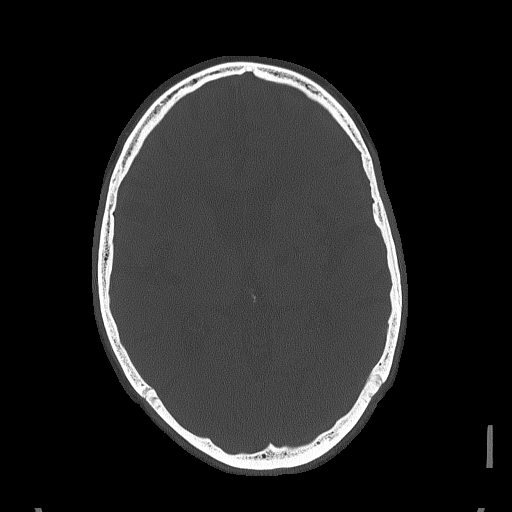

[Series 4: coronal soft tissue · coronal · 0.36mm/px · 3 of 77 slices shown]
[im 26/77  brain]
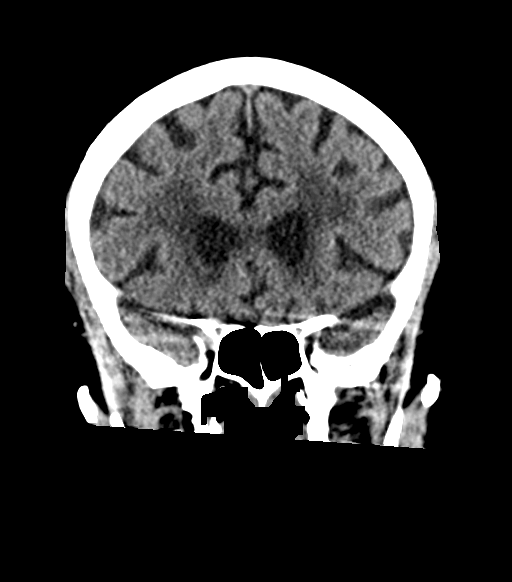
[im 34/77  brain]
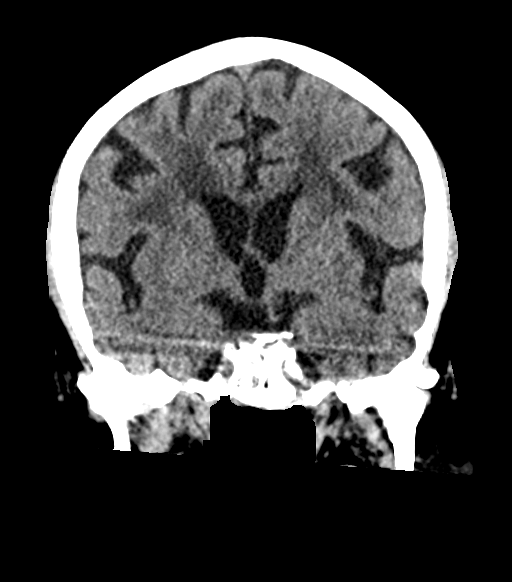
[im 43/77  brain]
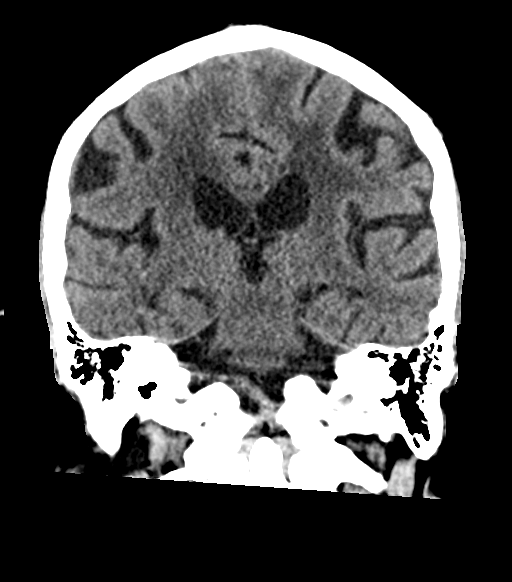

[Series 5: sagittal soft tissue · sagittal · 0.42mm/px · 3 of 62 slices shown]
[im 21/62  brain]
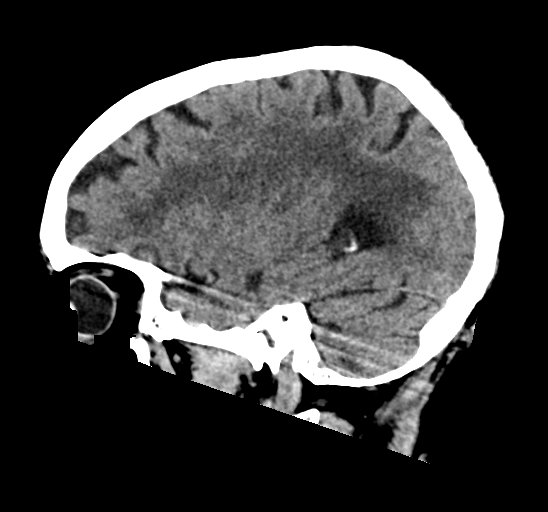
[im 31/62  brain]
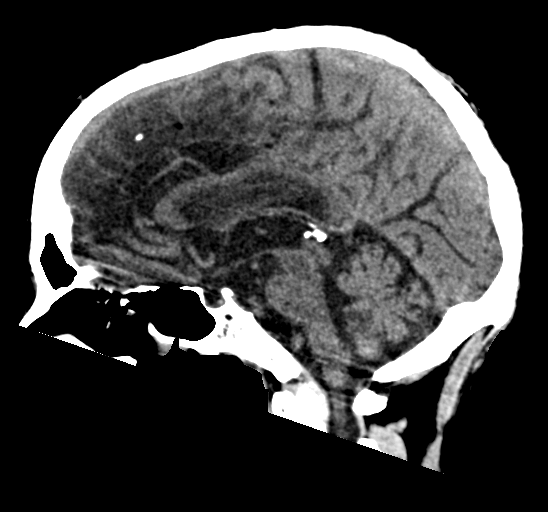
[im 41/62  brain]
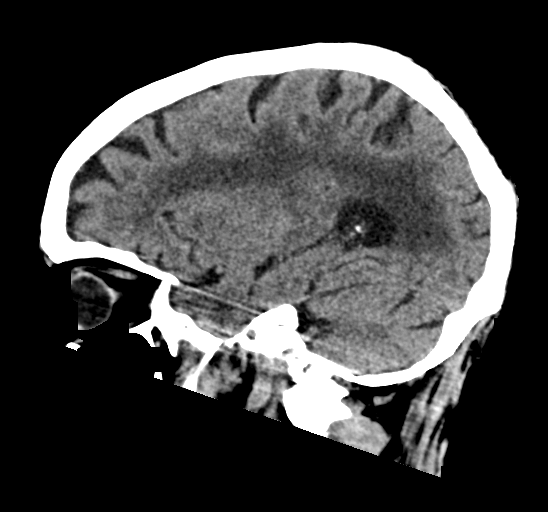

[17 of 47 positions shown; findings below may reference images not displayed]

FINDINGS: Brain: No evidence of acute infarction, hemorrhage, hydrocephalus,
extra-axial collection or mass lesion/mass effect. Chronic atrophic
and ischemic changes are noted. Chronic lacunar infarcts are noted
in region the thalami and basal ganglia bilaterally stable from the
prior study.

Vascular: No hyperdense vessel or unexpected calcification.

Skull: Normal. Negative for fracture or focal lesion.

Sinuses/Orbits: No acute finding.

Other: None.
IMPRESSION: Chronic atrophic and ischemic changes stable prior exam.

## 2022-08-12 ENCOUNTER — Observation Stay (HOSPITAL_COMMUNITY): Payer: Medicare Other

## 2022-08-12 ENCOUNTER — Encounter (HOSPITAL_COMMUNITY): Payer: Self-pay | Admitting: Internal Medicine

## 2022-08-12 ENCOUNTER — Observation Stay (HOSPITAL_COMMUNITY)
Admission: EM | Admit: 2022-08-12 | Discharge: 2022-08-17 | Disposition: A | Discharge status: Skilled Nursing Facility | Payer: Medicare Other | Attending: Internal Medicine | Admitting: Internal Medicine

## 2022-08-12 ENCOUNTER — Emergency Department (HOSPITAL_COMMUNITY): Payer: Medicare Other

## 2022-08-12 DIAGNOSIS — Z7984 Long term (current) use of oral hypoglycemic drugs: Secondary | ICD-10-CM | POA: Insufficient documentation

## 2022-08-12 DIAGNOSIS — G9341 Metabolic encephalopathy: Principal | ICD-10-CM | POA: Insufficient documentation

## 2022-08-12 DIAGNOSIS — Z8673 Personal history of transient ischemic attack (TIA), and cerebral infarction without residual deficits: Secondary | ICD-10-CM | POA: Diagnosis not present

## 2022-08-12 DIAGNOSIS — Z66 Do not resuscitate: Secondary | ICD-10-CM | POA: Insufficient documentation

## 2022-08-12 DIAGNOSIS — E785 Hyperlipidemia, unspecified: Secondary | ICD-10-CM | POA: Diagnosis present

## 2022-08-12 DIAGNOSIS — Z7902 Long term (current) use of antithrombotics/antiplatelets: Secondary | ICD-10-CM | POA: Insufficient documentation

## 2022-08-12 DIAGNOSIS — Z96643 Presence of artificial hip joint, bilateral: Secondary | ICD-10-CM | POA: Insufficient documentation

## 2022-08-12 DIAGNOSIS — E119 Type 2 diabetes mellitus without complications: Secondary | ICD-10-CM | POA: Diagnosis not present

## 2022-08-12 DIAGNOSIS — I2510 Atherosclerotic heart disease of native coronary artery without angina pectoris: Secondary | ICD-10-CM | POA: Diagnosis not present

## 2022-08-12 DIAGNOSIS — R2689 Other abnormalities of gait and mobility: Secondary | ICD-10-CM | POA: Diagnosis not present

## 2022-08-12 DIAGNOSIS — M6281 Muscle weakness (generalized): Secondary | ICD-10-CM | POA: Diagnosis not present

## 2022-08-12 DIAGNOSIS — Z7982 Long term (current) use of aspirin: Secondary | ICD-10-CM | POA: Insufficient documentation

## 2022-08-12 DIAGNOSIS — I10 Essential (primary) hypertension: Secondary | ICD-10-CM | POA: Insufficient documentation

## 2022-08-12 DIAGNOSIS — R4781 Slurred speech: Principal | ICD-10-CM

## 2022-08-12 DIAGNOSIS — R2681 Unsteadiness on feet: Secondary | ICD-10-CM | POA: Diagnosis not present

## 2022-08-12 DIAGNOSIS — Z85828 Personal history of other malignant neoplasm of skin: Secondary | ICD-10-CM | POA: Diagnosis not present

## 2022-08-12 DIAGNOSIS — F332 Major depressive disorder, recurrent severe without psychotic features: Secondary | ICD-10-CM | POA: Diagnosis present

## 2022-08-12 DIAGNOSIS — Z87891 Personal history of nicotine dependence: Secondary | ICD-10-CM | POA: Diagnosis not present

## 2022-08-12 DIAGNOSIS — Z79899 Other long term (current) drug therapy: Secondary | ICD-10-CM | POA: Diagnosis not present

## 2022-08-12 DIAGNOSIS — R4701 Aphasia: Secondary | ICD-10-CM | POA: Insufficient documentation

## 2022-08-12 LAB — I-STAT CHEM 8, ED
BUN: 17 mg/dL (ref 8–23)
Calcium, Ion: 1.1 mmol/L — ABNORMAL LOW (ref 1.15–1.40)
Chloride: 104 mmol/L (ref 98–111)
Creatinine, Ser: 0.8 mg/dL (ref 0.61–1.24)
Glucose, Bld: 198 mg/dL — ABNORMAL HIGH (ref 70–99)
HCT: 35 % — ABNORMAL LOW (ref 39.0–52.0)
Hemoglobin: 11.9 g/dL — ABNORMAL LOW (ref 13.0–17.0)
Potassium: 4.1 mmol/L (ref 3.5–5.1)
Sodium: 137 mmol/L (ref 135–145)
TCO2: 19 mmol/L — ABNORMAL LOW (ref 22–32)

## 2022-08-12 LAB — CBC
HCT: 36.2 % — ABNORMAL LOW (ref 39.0–52.0)
Hemoglobin: 11.6 g/dL — ABNORMAL LOW (ref 13.0–17.0)
MCH: 27.4 pg (ref 26.0–34.0)
MCHC: 32 g/dL (ref 30.0–36.0)
MCV: 85.4 fL (ref 80.0–100.0)
Platelets: 257 10*3/uL (ref 150–400)
RBC: 4.24 MIL/uL (ref 4.22–5.81)
RDW: 13.9 % (ref 11.5–15.5)
WBC: 5.2 10*3/uL (ref 4.0–10.5)
nRBC: 0 % (ref 0.0–0.2)

## 2022-08-12 LAB — COMPREHENSIVE METABOLIC PANEL
ALT: 15 U/L (ref 0–44)
AST: 16 U/L (ref 15–41)
Albumin: 3.5 g/dL (ref 3.5–5.0)
Alkaline Phosphatase: 52 U/L (ref 38–126)
Anion gap: 13 (ref 5–15)
BUN: 14 mg/dL (ref 8–23)
CO2: 17 mmol/L — ABNORMAL LOW (ref 22–32)
Calcium: 8.5 mg/dL — ABNORMAL LOW (ref 8.9–10.3)
Chloride: 106 mmol/L (ref 98–111)
Creatinine, Ser: 0.93 mg/dL (ref 0.61–1.24)
GFR, Estimated: >60 mL/min (ref >60)
Glucose, Bld: 194 mg/dL — ABNORMAL HIGH (ref 70–99)
Potassium: 3.8 mmol/L (ref 3.5–5.1)
Sodium: 136 mmol/L (ref 135–145)
Total Bilirubin: 0.3 mg/dL (ref 0.3–1.2)
Total Protein: 5.7 g/dL — ABNORMAL LOW (ref 6.5–8.1)

## 2022-08-12 LAB — DIFFERENTIAL
Abs Immature Granulocytes: 0.02 10*3/uL (ref 0.00–0.07)
Basophils Absolute: 0.1 10*3/uL (ref 0.0–0.1)
Basophils Relative: 1 %
Eosinophils Absolute: 0.3 10*3/uL (ref 0.0–0.5)
Eosinophils Relative: 5 %
Immature Granulocytes: 0 %
Lymphocytes Relative: 23 %
Lymphs Abs: 1.2 10*3/uL (ref 0.7–4.0)
Monocytes Absolute: 0.5 10*3/uL (ref 0.1–1.0)
Monocytes Relative: 9 %
Neutro Abs: 3.1 10*3/uL (ref 1.7–7.7)
Neutrophils Relative %: 62 %

## 2022-08-12 LAB — PROTIME-INR
INR: 1 (ref 0.8–1.2)
Prothrombin Time: 13.6 seconds (ref 11.4–15.2)

## 2022-08-12 LAB — APTT: aPTT: 28 seconds (ref 24–36)

## 2022-08-12 LAB — CBG MONITORING, ED: Glucose-Capillary: 171 mg/dL — ABNORMAL HIGH (ref 70–99)

## 2022-08-12 LAB — ETHANOL: Alcohol, Ethyl (B): <10 mg/dL (ref <10)

## 2022-08-12 MED ORDER — METOPROLOL SUCCINATE ER 25 MG PO TB24
25.0000 mg | ORAL_TABLET | Freq: Every evening | ORAL | Status: DC
  Administered 2022-08-13 – 2022-08-16 (×4): 25 mg via ORAL
  Filled 2022-08-12 (×4): qty 1

## 2022-08-12 MED ORDER — INSULIN ASPART 100 UNIT/ML IJ SOLN: 0.0000 [IU] | Freq: Every day | INTRAMUSCULAR | Status: DC

## 2022-08-12 MED ORDER — ACETAMINOPHEN 325 MG PO TABS
650.0000 mg | ORAL_TABLET | Freq: Four times a day (QID) | ORAL | Status: DC | PRN
  Administered 2022-08-14: 650 mg via ORAL
  Filled 2022-08-12: qty 2

## 2022-08-12 MED ORDER — AMLODIPINE BESYLATE 10 MG PO TABS
10.0000 mg | ORAL_TABLET | Freq: Every day | ORAL | Status: DC
  Administered 2022-08-13 – 2022-08-17 (×5): 10 mg via ORAL
  Filled 2022-08-12 (×5): qty 1

## 2022-08-12 MED ORDER — LISINOPRIL 20 MG PO TABS
40.0000 mg | ORAL_TABLET | Freq: Every day | ORAL | Status: DC
  Administered 2022-08-13 – 2022-08-17 (×5): 40 mg via ORAL
  Filled 2022-08-12 (×5): qty 2

## 2022-08-12 MED ORDER — ACETAMINOPHEN 650 MG RE SUPP: 650.0000 mg | Freq: Four times a day (QID) | RECTAL | Status: DC | PRN

## 2022-08-12 MED ORDER — ESCITALOPRAM OXALATE 10 MG PO TABS
20.0000 mg | ORAL_TABLET | Freq: Every day | ORAL | Status: DC
  Administered 2022-08-13 – 2022-08-17 (×5): 20 mg via ORAL
  Filled 2022-08-12 (×5): qty 2

## 2022-08-12 MED ORDER — MELATONIN 3 MG PO TABS
3.0000 mg | ORAL_TABLET | Freq: Every day | ORAL | Status: DC
  Administered 2022-08-12 – 2022-08-16 (×5): 3 mg via ORAL
  Filled 2022-08-12 (×5): qty 1

## 2022-08-12 MED ORDER — IOHEXOL 350 MG/ML SOLN
100.0000 mL | Freq: Once | INTRAVENOUS | Status: AC | PRN
  Administered 2022-08-12: 100 mL via INTRAVENOUS

## 2022-08-12 MED ORDER — RISPERIDONE 0.5 MG PO TABS
0.5000 mg | ORAL_TABLET | ORAL | Status: DC
  Administered 2022-08-13 – 2022-08-17 (×9): 0.5 mg via ORAL
  Filled 2022-08-12 (×9): qty 1

## 2022-08-12 MED ORDER — CLOPIDOGREL BISULFATE 75 MG PO TABS
75.0000 mg | ORAL_TABLET | Freq: Every day | ORAL | Status: DC
  Administered 2022-08-13 – 2022-08-17 (×5): 75 mg via ORAL
  Filled 2022-08-12 (×5): qty 1

## 2022-08-12 MED ORDER — ONDANSETRON HCL 4 MG PO TABS: 4.0000 mg | ORAL_TABLET | Freq: Four times a day (QID) | ORAL | Status: DC | PRN

## 2022-08-12 MED ORDER — HEPARIN SODIUM (PORCINE) 5000 UNIT/ML IJ SOLN
5000.0000 [IU] | Freq: Three times a day (TID) | INTRAMUSCULAR | Status: DC
  Administered 2022-08-13 – 2022-08-17 (×13): 5000 [IU] via SUBCUTANEOUS
  Filled 2022-08-12 (×13): qty 1

## 2022-08-12 MED ORDER — LEVOTHYROXINE SODIUM 50 MCG PO TABS
50.0000 ug | ORAL_TABLET | Freq: Every day | ORAL | Status: DC
  Administered 2022-08-13 – 2022-08-17 (×5): 50 ug via ORAL
  Filled 2022-08-12 (×5): qty 1

## 2022-08-12 MED ORDER — ASPIRIN 81 MG PO TBEC
81.0000 mg | DELAYED_RELEASE_TABLET | Freq: Every day | ORAL | Status: DC
  Administered 2022-08-13 – 2022-08-17 (×5): 81 mg via ORAL
  Filled 2022-08-12 (×5): qty 1

## 2022-08-12 MED ORDER — CLONIDINE HCL 0.3 MG/24HR TD PTWK: 0.3000 mg | MEDICATED_PATCH | TRANSDERMAL | Status: DC

## 2022-08-12 MED ORDER — INSULIN ASPART 100 UNIT/ML IJ SOLN
0.0000 [IU] | Freq: Three times a day (TID) | INTRAMUSCULAR | Status: DC
  Administered 2022-08-14 (×2): 1 [IU] via SUBCUTANEOUS
  Administered 2022-08-14: 3 [IU] via SUBCUTANEOUS
  Administered 2022-08-15 (×2): 2 [IU] via SUBCUTANEOUS
  Administered 2022-08-15: 1 [IU] via SUBCUTANEOUS
  Administered 2022-08-16 – 2022-08-17 (×3): 2 [IU] via SUBCUTANEOUS

## 2022-08-12 MED ORDER — ISOSORBIDE MONONITRATE ER 30 MG PO TB24
30.0000 mg | ORAL_TABLET | Freq: Every evening | ORAL | Status: DC
  Administered 2022-08-13 – 2022-08-16 (×4): 30 mg via ORAL
  Filled 2022-08-12 (×4): qty 1

## 2022-08-12 MED ORDER — ONDANSETRON HCL 4 MG/2ML IJ SOLN: 4.0000 mg | Freq: Four times a day (QID) | INTRAMUSCULAR | Status: DC | PRN

## 2022-08-12 NOTE — Assessment & Plan Note (Signed)
Admit to obs med/tele bed. MRI brain negative for acute CVA. Shows old CVAs. CT head and CTA negative for LVO. Neurology obtaining EEG. Pt's son/POA(Jason) states pt has had numerous "spells" in the past. He is not surprised by this event. Son hopes pt is able to be discharged back to ALF([facility]) tomorrow. Son states that getting patient back to ALF is the best place for his father.

## 2022-08-12 NOTE — H&P (Addendum)
History and Physical    Steve Ramirez GMW:[id number] DOB: 26-Mar-1935 DOA: 08/12/2022  DOS: the patient was seen and examined on 08/12/2022  PCP: Assunta Found, MD   Patient coming from: ALF  [facility]  I have personally briefly reviewed patient's old medical records in  Link  CC: aphasia HPI: 87 year old Caucasian male history of coronary disease, hypertension, diabetes, depression, history of TIAs presents to the ER today with acute onset of aphasia around 4:45 PM.  Patient lives at [facility] which is an assisted living.  He lives there with his wife as well as dementia.  Also reported that the patient had right-sided weakness.  Patient unable to give any history review of systems.  He does not recall having any aphasia or right-sided weakness.  Workup in the ER consisted of MRI of the brain which was negative for acute stroke, CT head which was negative for acute stroke, CTA head and neck which was negative for large vessel occlusion.  MRI did show prior old strokes of the bilateral flam eye, and chronic small vessel ischemic changes in the pons.  CTA of the neck did show approximately 70% stenosis of the right internal carotid.  50% stenosis of the right vertebral artery at the origin.  Neurology was consulted and recommended admission with an EEG.  I discussed the case with the patient's son [name] and patient's daughter-in-law Leotis Shames.  They are currently on vacation and cannot come to the hospital see the patient.  Confirmed with the son the patient is a DNR.  His MOST form was completed in November 2022 and patient was noted to be on comfort care at that time.  Son was not aware of comfort care status.  The patient's daughter-in-law Leotis Shames is the one that signed the MOST form.  [facility] contacted for admission.   ED Course: MRI brain negative for acute CVA, CT head negative. CTA head/neck shows 70% stenosis right  ICA  Review of Systems:  Review of Systems  Unable to perform ROS: Dementia    Past Medical History:  Diagnosis Date   Acute CVA (cerebrovascular accident) (HCC) 09/13/2015   Coronary atherosclerosis of native coronary artery    Essential hypertension, benign    GIB (gastrointestinal bleeding)    Colonic diverticular bleed   History of melanoma    History of TIAs    Intentional overdose (HCC) 11/05/2021   Right hemiparesis (HCC) 12/02/2016   Stroke (HCC)    Type 2 diabetes mellitus (HCC)     Past Surgical History:  Procedure Laterality Date   COLONOSCOPY  08/13/2010   Procedure: COLONOSCOPY;  Surgeon: Malissa Hippo, MD;  Location: [facility];  Service: Endoscopy;  Laterality: N/A;   JOINT REPLACEMENT     KNEE SURGERY     arthroscopic   LEFT HEART CATHETERIZATION WITH CORONARY ANGIOGRAM N/A 11/24/2010   Procedure: LEFT HEART CATHETERIZATION WITH CORONARY ANGIOGRAM;  Surgeon: Wendall Stade, MD;  Location: [facility] CATH LAB;  Service: Cardiovascular;  Laterality: N/A;   neck fusion     years ago   TOTAL HIP ARTHROPLASTY Right 08/02/2018   Procedure: TOTAL HIP ARTHROPLASTY ANTERIOR APPROACH;  Surgeon: Sheral Apley, MD;  Location: [facility] ORS;  Service: Orthopedics;  Laterality: Right;   TOTAL HIP ARTHROPLASTY Left 10/18/2018   Procedure: TOTAL HIP ARTHROPLASTY ANTERIOR APPROACH;  Surgeon: Sheral Apley, MD;  Location: [facility] ORS;  Service: Orthopedics;  Laterality: Left;     reports that he  quit smoking about 42 years ago. His smoking use included cigarettes. He has never used smokeless tobacco. He reports current alcohol use. He reports that he does not use drugs.  No Known Allergies  Family History  Problem Relation Age of Onset   Coronary artery disease Father    Asthma Other    Emphysema Mother    Coronary artery disease Paternal Uncle    Brain cancer Brother     Prior to Admission medications   Medication Sig Start Date End Date Taking? Authorizing Provider   amLODipine (NORVASC) 10 MG tablet Take 1 tablet (10 mg total) by mouth daily. 11/14/21  Yes Sarina Ill, DO  aspirin EC 81 MG tablet Take 1 tablet (81 mg total) by mouth 2 (two) times daily. For DVT prophylaxis for 30 days after surgery. 10/18/18  Yes Albina Billet III, PA-C  cetirizine (ZYRTEC) 10 MG tablet Take 10 mg by mouth daily as needed for allergies.   Yes [provider]  cloNIDine (CATAPRES - DOSED IN MG/24 HR) 0.3 mg/24hr patch Place 1 patch (0.3 mg total) onto the skin once a week. 11/16/21  Yes Sarina Ill, DO  clopidogrel (PLAVIX) 75 MG tablet Take 1 tablet (75 mg total) by mouth daily. 11/15/21  Yes Sarina Ill, DO  cyanocobalamin (VITAMIN B12) 1000 MCG tablet Take 1 tablet (1,000 mcg total) by mouth daily. 11/14/21  Yes Sarina Ill, DO  escitalopram (LEXAPRO) 20 MG tablet Take 1 tablet (20 mg total) by mouth daily. 11/14/21  Yes Sarina Ill, DO  ferrous sulfate 325 (65 FE) MG tablet Take 1 tablet (325 mg total) by mouth daily with breakfast. 11/14/21  Yes Sarina Ill, DO  isosorbide mononitrate (IMDUR) 30 MG 24 hr tablet Take 1 tablet (30 mg total) by mouth every evening. 11/14/21  Yes Sarina Ill, DO  lamoTRIgine (LAMICTAL) 25 MG tablet Take 25 mg by mouth in the morning.   Yes [provider]  levothyroxine (SYNTHROID) 50 MCG tablet Take 1 tablet (50 mcg total) by mouth daily. 11/14/21  Yes Sarina Ill, DO  lisinopril (ZESTRIL) 40 MG tablet Take 1 tablet (40 mg total) by mouth daily. 11/14/21  Yes Sarina Ill, DO  loratadine (CLARITIN) 10 MG tablet Take 10 mg by mouth daily as needed for allergies.   Yes [provider]  melatonin 3 MG TABS tablet Take 3 mg by mouth at bedtime.   Yes [provider]  metFORMIN (GLUCOPHAGE) 1000 MG tablet Take 1 tablet (1,000 mg total) by mouth 2 (two) times daily with a meal. 11/14/21  Yes Sarina Ill, DO  metoprolol succinate (TOPROL-XL) 25 MG 24 hr tablet Take 1 tablet (25 mg total) by mouth every evening. Patient taking differently: Take 25 mg by mouth daily. 11/14/21  Yes Sarina Ill, DO  Multiple Vitamin (MULTIVITAMIN WITH MINERALS) TABS tablet Take 1 tablet by mouth daily. 11/15/21  Yes Sarina Ill, DO  nitroGLYCERIN (NITROSTAT) 0.4 MG SL tablet Place 1 tablet (0.4 mg total) under the tongue every 5 (five) minutes as needed for chest pain. 11/14/21  Yes Sarina Ill, DO  Omega-3 Fatty Acids (FISH OIL) 1000 MG CAPS Take 1,000 mg by mouth daily.   Yes [provider]  pravastatin (PRAVACHOL) 40 MG tablet Take 40 mg by mouth daily.   Yes [provider]  risperiDONE (RISPERDAL) 0.5 MG tablet Take 1 tablet (0.5 mg total) by mouth 2 (two) times daily at  8 am and 4 pm. Patient taking differently: Take 0.5 mg by mouth 2 (two) times daily. 11/14/21  Yes Sarina Ill, DO  rOPINIRole (REQUIP) 1 MG tablet Take 1 mg by mouth daily.   Yes [provider]  spironolactone (ALDACTONE) 25 MG tablet Take 1 tablet (25 mg total) by mouth daily. 11/15/21  Yes Sarina Ill, DO  rosuvastatin (CRESTOR) 40 MG tablet Take 1 tablet (40 mg total) by mouth daily. 11/26/10 04/08/11  Creig Hines, NP    Physical Exam: Vitals:   08/12/22 1700 08/12/22 1838 08/12/22 1915  BP:  (!) 141/80 (!) 148/61  Pulse:  75 70  Resp:   15  SpO2:  97% 96%  Weight: 83.6 kg      Physical Exam Vitals and nursing note reviewed.  Constitutional:      General: He is not in acute distress.    Appearance: He is not toxic-appearing or diaphoretic.  HENT:     Head: Normocephalic and atraumatic.     Nose: Nose normal.  Eyes:     General: No scleral icterus. Cardiovascular:     Rate and Rhythm: Normal rate and regular rhythm.  Pulmonary:     Effort: Pulmonary effort is normal.     Breath sounds: Normal breath sounds.  Abdominal:      General: Bowel sounds are normal. There is no distension.     Palpations: Abdomen is soft.     Tenderness: There is no abdominal tenderness. There is no guarding.  Musculoskeletal:     Right lower leg: No edema.     Left lower leg: No edema.  Skin:    General: Skin is warm and dry.     Capillary Refill: Capillary refill takes less than 2 seconds.  Neurological:     Mental Status: He is alert. He is disoriented.     Comments: Not oriented to place or time. He did know who he was and birthdate.      Labs on Admission: I have personally reviewed following labs and imaging studies  CBC: Recent Labs  Lab 08/12/22 1745 08/12/22 1752  WBC 5.2  --   NEUTROABS 3.1  --   HGB 11.6* 11.9*  HCT 36.2* 35.0*  MCV 85.4  --   PLT 257  --    Basic Metabolic Panel: Recent Labs  Lab 08/12/22 1745 08/12/22 1752  NA 136 137  K 3.8 4.1  CL 106 104  CO2 17*  --   GLUCOSE 194* 198*  BUN 14 17  CREATININE 0.93 0.80  CALCIUM 8.5*  --    GFR: CrCl cannot be calculated (Unknown ideal weight.). Liver Function Tests: Recent Labs  Lab 08/12/22 1745  AST 16  ALT 15  ALKPHOS 52  BILITOT 0.3  PROT 5.7*  ALBUMIN 3.5   Coagulation Profile: Recent Labs  Lab 08/12/22 1745  INR 1.0   CBG: Recent Labs  Lab 08/12/22 1744  GLUCAP 171*   Radiological Exams on Admission: I have personally reviewed images MR BRAIN WO CONTRAST  Result Date: 08/12/2022 CLINICAL DATA:  Acute stroke presentation, left hemisphere. EXAM: MRI HEAD WITHOUT CONTRAST TECHNIQUE: Multiplanar, multiecho pulse sequences of the brain and surrounding structures were obtained without intravenous contrast. COMPARISON:  CT studies same day.  MRI 11/10/2020 FINDINGS: Brain: Diffusion imaging does not show any acute or subacute infarction. Chronic small-vessel ischemic changes are seen throughout the pons. No focal cerebellar insult. Cerebral hemispheres show old infarctions of the thalami and confluent chronic  small vessel  ischemic changes of the white matter. No mass, hemorrhage, hydrocephalus or extra-axial collection. Vascular: Major vessels at the base of the brain show flow. Skull and upper cervical spine: Negative Sinuses/Orbits: Clear/normal Other: None IMPRESSION: No acute finding by MRI. Extensive chronic small-vessel ischemic changes throughout the brain as outlined above. Electronically Signed   By: Paulina Fusi M.D.   On: 08/12/2022 18:35   CT ANGIO HEAD NECK W WO CM W PERF (CODE STROKE)  Result Date: 08/12/2022 CLINICAL DATA:  Acute stroke.  Right-sided weakness. EXAM: CT ANGIOGRAPHY HEAD AND NECK CT PERFUSION BRAIN TECHNIQUE: Multidetector CT imaging of the head and neck was performed using the standard protocol during bolus administration of intravenous contrast. Multiplanar CT image reconstructions and MIPs were obtained to evaluate the vascular anatomy. Carotid stenosis measurements (when applicable) are obtained utilizing NASCET criteria, using the distal internal carotid diameter as the denominator. Multiphase CT imaging of the brain was performed following IV bolus contrast injection. Subsequent parametric perfusion maps were calculated using RAPID software. RADIATION DOSE REDUCTION: This exam was performed according to the departmental dose-optimization program which includes automated exposure control, adjustment of the mA and/or kV according to patient size and/or use of iterative reconstruction technique. CONTRAST:  OMNIPAQUE IOHEXOL 350 MG/ML SOLN COMPARISON:  Head CT immediately prior FINDINGS: CTA NECK FINDINGS Aortic arch: Branching pattern is normal. Aortic atherosclerosis. Brachiocephalic vessel origin atherosclerosis but without significant stenosis. Right carotid system: Common carotid artery shows some scattered plaque but is widely patent to the bifurcation. Calcified plaque at the carotid bifurcation and ICA bulb. Minimal diameter at the distal bulb is 1.5 mm. Compared to a more distal  cervical ICA diameter of 4.7 mm, This indicates a stenosis of 70%. Left carotid system: Common carotid artery shows some scattered plaque but is widely patent to the bifurcation. Calcified plaque at the carotid bifurcation and ICA bulb. Minimal diameter at the proximal ICA measures 3 mm. Compared to a more distal cervical ICA diameter of 4.5 mm, this indicates a 33% stenosis. Vertebral arteries: Calcified plaque at the right vertebral artery origin with stenosis estimated at 50%. No stenosis of the dominant left vertebral artery origin. Both vessels are patent beyond that through the cervical region to the foramen magnum. Skeleton: Previous cervical fusion surgeries. Degenerative changes above and below that. Other neck: No mass or lymphadenopathy. Upper chest: Mild upper lobe scarring. Review of the MIP images confirms the above findings CTA HEAD FINDINGS Anterior circulation: Both internal carotid arteries are patent through the skull base and siphon regions. Ordinary siphon atherosclerotic change with stenosis estimated at 30% on both sides. The anterior and middle cerebral vessels are patent. No large vessel occlusion. Mild stenosis of the left M1 segment. Areas of atherosclerotic narrowing in the more distal branch vessels. Posterior circulation: Both vertebral arteries are patent through the foramen magnum. The small right vertebral artery gives a minor contribution to the basilar artery. The dominant left vertebral artery shows atherosclerotic plaque in the V4 segment but no stenosis greater than 30%. Mild atherosclerotic irregularity of the basilar artery but without significant stenosis. Superior cerebellar and posterior cerebral arteries are patent. Atherosclerotic narrowing and irregularity is seen within both PCA vessels. Venous sinuses: Patent and normal. Anatomic variants: None significant. Review of the MIP images confirms the above findings CT Brain Perfusion Findings: ASPECTS: 10 CBF (<30%) Volume:  0mL Perfusion (Tmax>6.0s) volume: 0mL Mismatch Volume: 0mL Infarction Location:None IMPRESSION: 1. No large vessel occlusion. 2. Aortic atherosclerosis. 3. Atherosclerotic disease at  both carotid bifurcations. 70% stenosis of the distal right ICA bulb. 33% stenosis of the proximal left ICA. 4. 50% stenosis of the right vertebral artery origin. 5. Atherosclerotic disease in the intracranial circulation. 30% stenosis of both carotid siphon regions. Mild stenosis of the left M1 segment. Areas of atherosclerotic narrowing and irregularity in the more distal branch vessels. 6. Atherosclerotic disease in the V4 segment of the left vertebral artery but without stenosis greater than 30%. 7. Atherosclerotic disease in the basilar artery but without significant stenosis. 8. No perfusion abnormality. Aortic Atherosclerosis (ICD10-I70.0). Electronically Signed   By: Paulina Fusi M.D.   On: 08/12/2022 18:12   CT HEAD CODE STROKE WO CONTRAST  Result Date: 08/12/2022 CLINICAL DATA:  Code stroke. EXAM: CT HEAD WITHOUT CONTRAST TECHNIQUE: Contiguous axial images were obtained from the base of the skull through the vertex without intravenous contrast. RADIATION DOSE REDUCTION: This exam was performed according to the departmental dose-optimization program which includes automated exposure control, adjustment of the mA and/or kV according to patient size and/or use of iterative reconstruction technique. COMPARISON:  12/02/2020 FINDINGS: Brain: Chronic brain atrophy with widespread chronic small-vessel ischemic changes of the pons and cerebral hemispheric white matter. Old lacunar infarctions of the thalami I left more than right. No visible acute cortical infarction. No mass, hemorrhage, hydrocephalus or extra-axial collection. Vascular: There is atherosclerotic calcification of the major vessels at the base of the brain. Skull: Negative Sinuses/Orbits: Clear/normal Other: None ASPECTS (Alberta Stroke Program Early CT Score) -  Ganglionic level infarction (caudate, lentiform nuclei, internal capsule, insula, M1-M3 cortex): 7 - Supraganglionic infarction (M4-M6 cortex): 3 Total score (0-10 with 10 being normal): 10 IMPRESSION: 1. No acute CT finding. Atrophy and chronic small-vessel ischemic changes as outlined above. 2. Aspects is 10. These results were communicated to Dr. Amada Jupiter at 5:55 pm on 08/12/2022 by text page via the [facility] messaging system. Electronically Signed   By: Paulina Fusi M.D.   On: 08/12/2022 17:56    EKG: My personal interpretation of EKG shows: NSR      Assessment/Plan Principal Problem:   Acute metabolic encephalopathy Active Problems:   Essential hypertension, benign   Diabetes mellitus without complication (HCC)   Hyperlipidemia   History of TIAs   Coronary atherosclerosis of native coronary artery   Major depressive disorder, recurrent severe without psychotic features (HCC)   DNR (do not resuscitate)/DNI(Do Not Intubate)    Assessment and Plan: * Acute metabolic encephalopathy Admit to obs med/tele bed. MRI brain negative for acute CVA. Shows old CVAs. CT head and CTA negative for LVO. Neurology obtaining EEG. Pt's son/POA(Jason) states pt has had numerous "spells" in the past. He is not surprised by this event. Son hopes pt is able to be discharged back to ALF([facility]) tomorrow. Son states that getting patient back to ALF is the best place for his father.  Major depressive disorder, recurrent severe without psychotic features (HCC) Stable. Continue lexapro  Coronary atherosclerosis of native coronary artery Continue imdur, toprol xl, asa, pravastatin.  History of TIAs On ASA and plavix  Hyperlipidemia On pravastatin 40 mg daily.  Diabetes mellitus without complication (HCC) Hold metformin. Add SSI during hospital stay only.  Essential hypertension, benign Stable. On catapres patch, aldactone, norvace, imdur, zestril, toprol-xl  DNR (do not  resuscitate)/DNI(Do Not Intubate)    Verified DNR/DNI with pt's HCPOA(Jason Dioguardi)    DVT prophylaxis: SQ Heparin Code Status: DNR/DNI(Do NOT Intubate) Family Communication: discussed with pt's son/POA(jason) and dtr-in-law [name]  Disposition Plan: return to ALF  Consults called: neurology  Admission status: Observation, Telemetry bed   Carollee Herter, DO [facility] 08/12/2022, 8:21 PM

## 2022-08-12 NOTE — ED Notes (Signed)
ED TO INPATIENT HANDOFF REPORT  ED Nurse Name and Phone #:  Cheral Almas, RN [phone] S Name/Age/Gender Steve Ramirez 87 y.o. male Room/Bed: 042C/042C  Code Status   Code Status: DNR  Home/SNF/Other Home Patient oriented to: self Is this baseline? No   Triage Complete: Triage complete  Chief Complaint Acute metabolic encephalopathy [G93.41]  Triage Note Pt to ed form assisted living with a CC of code stroke with right sided weakness. Pt is unable to speak. Pt will follow direction. Hx of stroke. LSN at 1645   Allergies No Known Allergies  Level of Care/Admitting Diagnosis ED Disposition     ED Disposition  Admit   Condition  --   Comment  Hospital Area: [facility] [[id number]]  Level of Care: Telemetry Medical [104]  May place patient in observation at [facility] or [facility] if equivalent level of care is available:: No  Covid Evaluation: Asymptomatic - no recent exposure (last 10 days) testing not required  Diagnosis: Acute metabolic encephalopathy [4696295]  Admitting Physician: Imogene Burn, ERIC [[id number]]  Attending Physician: Imogene Burn, ERIC [[id number]]          B Medical/Surgery History Past Medical History:  Diagnosis Date   Acute CVA (cerebrovascular accident) (HCC) 09/13/2015   Coronary atherosclerosis of native coronary artery    Essential hypertension, benign    GIB (gastrointestinal bleeding)    Colonic diverticular bleed   History of melanoma    History of TIAs    Intentional overdose (HCC) 11/05/2021   Right hemiparesis (HCC) 12/02/2016   Stroke (HCC)    Type 2 diabetes mellitus (HCC)    Past Surgical History:  Procedure Laterality Date   COLONOSCOPY  08/13/2010   Procedure: COLONOSCOPY;  Surgeon: Malissa Hippo, MD;  Location: [facility];  Service: Endoscopy;  Laterality: N/A;   JOINT REPLACEMENT     KNEE SURGERY     arthroscopic   LEFT HEART CATHETERIZATION WITH CORONARY ANGIOGRAM N/A 11/24/2010   Procedure: LEFT  HEART CATHETERIZATION WITH CORONARY ANGIOGRAM;  Surgeon: Wendall Stade, MD;  Location: [facility] CATH LAB;  Service: Cardiovascular;  Laterality: N/A;   neck fusion     years ago   TOTAL HIP ARTHROPLASTY Right 08/02/2018   Procedure: TOTAL HIP ARTHROPLASTY ANTERIOR APPROACH;  Surgeon: Sheral Apley, MD;  Location: [facility] ORS;  Service: Orthopedics;  Laterality: Right;   TOTAL HIP ARTHROPLASTY Left 10/18/2018   Procedure: TOTAL HIP ARTHROPLASTY ANTERIOR APPROACH;  Surgeon: Sheral Apley, MD;  Location: [facility] ORS;  Service: Orthopedics;  Laterality: Left;     A IV Location/Drains/Wounds Patient Lines/Drains/Airways Status     Active Line/Drains/Airways     Name Placement date Placement time Site Days   Pressure Injury 11/11/20 Buttocks Mid Stage 1 -  Intact skin with non-blanchable redness of a localized area usually over a bony prominence. 11/11/20  1828  -- 639            Intake/Output Last 24 hours No intake or output data in the 24 hours ending 08/12/22 2110  Labs/Imaging Results for orders placed or performed during the hospital encounter of 08/12/22 (from the past 48 hour(s))  CBG monitoring, ED     Status: Abnormal   Collection Time: 08/12/22  5:44 PM  Result Value Ref Range   Glucose-Capillary 171 (H) 70 - 99 mg/dL    Comment: Glucose reference range applies only to samples taken after fasting for at least 8 hours.  Ethanol     Status: None  Collection Time: 08/12/22  5:45 PM  Result Value Ref Range   Alcohol, Ethyl (B) <10 <10 mg/dL    Comment: (NOTE) Lowest detectable limit for serum alcohol is 10 mg/dL.  For medical purposes only. Performed at [facility], [address], [place], [place] [address]   Protime-INR     Status: None   Collection Time: 08/12/22  5:45 PM  Result Value Ref Range   Prothrombin Time 13.6 11.4 - 15.2 seconds   INR 1.0 0.8 - 1.2    Comment: (NOTE) INR goal varies based on device and disease states. Performed at [facility]  Lab, [address], [place], [place] [address]   APTT     Status: None   Collection Time: 08/12/22  5:45 PM  Result Value Ref Range   aPTT 28 24 - 36 seconds    Comment: Performed at [facility], [address] [place], [place] [address]  CBC     Status: Abnormal   Collection Time: 08/12/22  5:45 PM  Result Value Ref Range   WBC 5.2 4.0 - 10.5 K/uL   RBC 4.24 4.22 - 5.81 MIL/uL   Hemoglobin 11.6 (L) 13.0 - 17.0 g/dL   HCT 56.2 (L) 13.0 - 86.5 %   MCV 85.4 80.0 - 100.0 fL   MCH 27.4 26.0 - 34.0 pg   MCHC 32.0 30.0 - 36.0 g/dL   RDW 78.4 69.6 - 29.5 %   Platelets 257 150 - 400 K/uL   nRBC 0.0 0.0 - 0.2 %    Comment: Performed at [facility], [address] [place], [place] [address]  Differential     Status: None   Collection Time: 08/12/22  5:45 PM  Result Value Ref Range   Neutrophils Relative % 62 %   Neutro Abs 3.1 1.7 - 7.7 K/uL   Lymphocytes Relative 23 %   Lymphs Abs 1.2 0.7 - 4.0 K/uL   Monocytes Relative 9 %   Monocytes Absolute 0.5 0.1 - 1.0 K/uL   Eosinophils Relative 5 %   Eosinophils Absolute 0.3 0.0 - 0.5 K/uL   Basophils Relative 1 %   Basophils Absolute 0.1 0.0 - 0.1 K/uL   Immature Granulocytes 0 %   Abs Immature Granulocytes 0.02 0.00 - 0.07 K/uL    Comment: Performed at [facility], [address] [place], [place] [address]  Comprehensive metabolic panel     Status: Abnormal   Collection Time: 08/12/22  5:45 PM  Result Value Ref Range   Sodium 136 135 - 145 mmol/L   Potassium 3.8 3.5 - 5.1 mmol/L   Chloride 106 98 - 111 mmol/L   CO2 17 (L) 22 - 32 mmol/L   Glucose, Bld 194 (H) 70 - 99 mg/dL    Comment: Glucose reference range applies only to samples taken after fasting for at least 8 hours.   BUN 14 8 - 23 mg/dL   Creatinine, Ser 0.27 0.61 - 1.24 mg/dL   Calcium 8.5 (L) 8.9 - 10.3 mg/dL   Total Protein 5.7 (L) 6.5 - 8.1 g/dL   Albumin 3.5 3.5 - 5.0 g/dL   AST 16 15 - 41 U/L   ALT 15 0 - 44 U/L   Alkaline Phosphatase 52 38 -  126 U/L   Total Bilirubin 0.3 0.3 - 1.2 mg/dL   GFR, Estimated >25 >36 mL/min    Comment: (NOTE) Calculated using the CKD-EPI Creatinine Equation (2021)  Anion gap 13 5 - 15    Comment: Performed at [facility], [address] [place], [place] [address]  I-stat chem 8, ED     Status: Abnormal   Collection Time: 08/12/22  5:52 PM  Result Value Ref Range   Sodium 137 135 - 145 mmol/L   Potassium 4.1 3.5 - 5.1 mmol/L   Chloride 104 98 - 111 mmol/L   BUN 17 8 - 23 mg/dL   Creatinine, Ser 6.04 0.61 - 1.24 mg/dL   Glucose, Bld 540 (H) 70 - 99 mg/dL    Comment: Glucose reference range applies only to samples taken after fasting for at least 8 hours.   Calcium, Ion 1.10 (L) 1.15 - 1.40 mmol/L   TCO2 19 (L) 22 - 32 mmol/L   Hemoglobin 11.9 (L) 13.0 - 17.0 g/dL   HCT 98.1 (L) 19.1 - 47.8 %   MR BRAIN WO CONTRAST  Result Date: 08/12/2022 CLINICAL DATA:  Acute stroke presentation, left hemisphere. EXAM: MRI HEAD WITHOUT CONTRAST TECHNIQUE: Multiplanar, multiecho pulse sequences of the brain and surrounding structures were obtained without intravenous contrast. COMPARISON:  CT studies same day.  MRI 11/10/2020 FINDINGS: Brain: Diffusion imaging does not show any acute or subacute infarction. Chronic small-vessel ischemic changes are seen throughout the pons. No focal cerebellar insult. Cerebral hemispheres show old infarctions of the thalami and confluent chronic small vessel ischemic changes of the white matter. No mass, hemorrhage, hydrocephalus or extra-axial collection. Vascular: Major vessels at the base of the brain show flow. Skull and upper cervical spine: Negative Sinuses/Orbits: Clear/normal Other: None IMPRESSION: No acute finding by MRI. Extensive chronic small-vessel ischemic changes throughout the brain as outlined above. Electronically Signed   By: Paulina Fusi M.D.   On: 08/12/2022 18:35   CT ANGIO HEAD NECK W WO CM W PERF (CODE STROKE)  Result Date: 08/12/2022 CLINICAL  DATA:  Acute stroke.  Right-sided weakness. EXAM: CT ANGIOGRAPHY HEAD AND NECK CT PERFUSION BRAIN TECHNIQUE: Multidetector CT imaging of the head and neck was performed using the standard protocol during bolus administration of intravenous contrast. Multiplanar CT image reconstructions and MIPs were obtained to evaluate the vascular anatomy. Carotid stenosis measurements (when applicable) are obtained utilizing NASCET criteria, using the distal internal carotid diameter as the denominator. Multiphase CT imaging of the brain was performed following IV bolus contrast injection. Subsequent parametric perfusion maps were calculated using RAPID software. RADIATION DOSE REDUCTION: This exam was performed according to the departmental dose-optimization program which includes automated exposure control, adjustment of the mA and/or kV according to patient size and/or use of iterative reconstruction technique. CONTRAST:  OMNIPAQUE IOHEXOL 350 MG/ML SOLN COMPARISON:  Head CT immediately prior FINDINGS: CTA NECK FINDINGS Aortic arch: Branching pattern is normal. Aortic atherosclerosis. Brachiocephalic vessel origin atherosclerosis but without significant stenosis. Right carotid system: Common carotid artery shows some scattered plaque but is widely patent to the bifurcation. Calcified plaque at the carotid bifurcation and ICA bulb. Minimal diameter at the distal bulb is 1.5 mm. Compared to a more distal cervical ICA diameter of 4.7 mm, This indicates a stenosis of 70%. Left carotid system: Common carotid artery shows some scattered plaque but is widely patent to the bifurcation. Calcified plaque at the carotid bifurcation and ICA bulb. Minimal diameter at the proximal ICA measures 3 mm. Compared to a more distal cervical ICA diameter of 4.5 mm, this indicates a 33% stenosis. Vertebral arteries: Calcified plaque at the right vertebral artery origin with stenosis estimated at  50%. No stenosis of the dominant left vertebral  artery origin. Both vessels are patent beyond that through the cervical region to the foramen magnum. Skeleton: Previous cervical fusion surgeries. Degenerative changes above and below that. Other neck: No mass or lymphadenopathy. Upper chest: Mild upper lobe scarring. Review of the MIP images confirms the above findings CTA HEAD FINDINGS Anterior circulation: Both internal carotid arteries are patent through the skull base and siphon regions. Ordinary siphon atherosclerotic change with stenosis estimated at 30% on both sides. The anterior and middle cerebral vessels are patent. No large vessel occlusion. Mild stenosis of the left M1 segment. Areas of atherosclerotic narrowing in the more distal branch vessels. Posterior circulation: Both vertebral arteries are patent through the foramen magnum. The small right vertebral artery gives a minor contribution to the basilar artery. The dominant left vertebral artery shows atherosclerotic plaque in the V4 segment but no stenosis greater than 30%. Mild atherosclerotic irregularity of the basilar artery but without significant stenosis. Superior cerebellar and posterior cerebral arteries are patent. Atherosclerotic narrowing and irregularity is seen within both PCA vessels. Venous sinuses: Patent and normal. Anatomic variants: None significant. Review of the MIP images confirms the above findings CT Brain Perfusion Findings: ASPECTS: 10 CBF (<30%) Volume: 0mL Perfusion (Tmax>6.0s) volume: 0mL Mismatch Volume: 0mL Infarction Location:None IMPRESSION: 1. No large vessel occlusion. 2. Aortic atherosclerosis. 3. Atherosclerotic disease at both carotid bifurcations. 70% stenosis of the distal right ICA bulb. 33% stenosis of the proximal left ICA. 4. 50% stenosis of the right vertebral artery origin. 5. Atherosclerotic disease in the intracranial circulation. 30% stenosis of both carotid siphon regions. Mild stenosis of the left M1 segment. Areas of atherosclerotic narrowing and  irregularity in the more distal branch vessels. 6. Atherosclerotic disease in the V4 segment of the left vertebral artery but without stenosis greater than 30%. 7. Atherosclerotic disease in the basilar artery but without significant stenosis. 8. No perfusion abnormality. Aortic Atherosclerosis (ICD10-I70.0). Electronically Signed   By: Paulina Fusi M.D.   On: 08/12/2022 18:12   CT HEAD CODE STROKE WO CONTRAST  Result Date: 08/12/2022 CLINICAL DATA:  Code stroke. EXAM: CT HEAD WITHOUT CONTRAST TECHNIQUE: Contiguous axial images were obtained from the base of the skull through the vertex without intravenous contrast. RADIATION DOSE REDUCTION: This exam was performed according to the departmental dose-optimization program which includes automated exposure control, adjustment of the mA and/or kV according to patient size and/or use of iterative reconstruction technique. COMPARISON:  12/02/2020 FINDINGS: Brain: Chronic brain atrophy with widespread chronic small-vessel ischemic changes of the pons and cerebral hemispheric white matter. Old lacunar infarctions of the thalami I left more than right. No visible acute cortical infarction. No mass, hemorrhage, hydrocephalus or extra-axial collection. Vascular: There is atherosclerotic calcification of the major vessels at the base of the brain. Skull: Negative Sinuses/Orbits: Clear/normal Other: None ASPECTS (Alberta Stroke Program Early CT Score) - Ganglionic level infarction (caudate, lentiform nuclei, internal capsule, insula, M1-M3 cortex): 7 - Supraganglionic infarction (M4-M6 cortex): 3 Total score (0-10 with 10 being normal): 10 IMPRESSION: 1. No acute CT finding. Atrophy and chronic small-vessel ischemic changes as outlined above. 2. Aspects is 10. These results were communicated to Dr. Amada Jupiter at 5:55 pm on 08/12/2022 by text page via the [facility] messaging system. Electronically Signed   By: Paulina Fusi M.D.   On: 08/12/2022 17:56    Pending Labs Unresulted  Labs (From admission, onward)     Start     Ordered   08/12/22 1847  Vitamin B1  Once,   URGENT        08/12/22 1846   08/12/22 1847  Vitamin B12  Once,   URGENT        08/12/22 1846   08/12/22 1847  Vitamin B12  Once,   URGENT        08/12/22 1846   08/12/22 1847  Folate  Once,   URGENT        08/12/22 1846   08/12/22 1745  Urine rapid drug screen (hosp performed)  Once,   STAT        08/12/22 1745   08/12/22 1745  Urinalysis, Routine w reflex microscopic -Urine, Clean Catch  Once,   URGENT       Question:  Specimen Source  Answer:  Urine, Clean Catch   08/12/22 1745   Signed and Held  Hemoglobin A1c  Once,   R       Comments: To assess prior glycemic control    Signed and Held   Signed and Held  Basic metabolic panel  Tomorrow morning,   R        Signed and Held            Vitals/Pain Today's Vitals   08/12/22 1700 08/12/22 1838 08/12/22 1915 08/12/22 2000  BP:  (!) 141/80 (!) 148/61 138/66  Pulse:  75 70 65  Resp:   15 16  SpO2:  97% 96% 96%  Weight: 83.6 kg       Isolation Precautions No active isolations  Medications Medications  iohexol (OMNIPAQUE) 350 MG/ML injection 100 mL (100 mLs Intravenous Contrast Given 08/12/22 1800)    Mobility walks with device     Focused Assessments Neuro Assessment Handoff:  Swallow screen pass?  UTA at this time    NIH Stroke Scale  Interval: Initial Level of Consciousness (1a.)   : Alert, keenly responsive LOC Questions (1b. )   : Answers neither question correctly LOC Commands (1c. )   : Performs both tasks correctly Best Gaze (2. )  : Normal Visual (3. )  : Complete hemianopia Facial Palsy (4. )    : Minor paralysis Motor Arm, Left (5a. )   : No drift Motor Arm, Right (5b. ) : No drift Motor Leg, Left (6a. )  : No drift Motor Leg, Right (6b. ) : Drift Limb Ataxia (7. ): Absent Sensory (8. )  : Normal, no sensory loss Best Language (9. )  : Severe aphasia Dysarthria (10. ): Normal Extinction/Inattention  (11.)   : No Abnormality Complete NIHSS TOTAL: 8 Last date known well: 08/12/22 Last time known well: 1645 Neuro Assessment: Within Defined Limits Neuro Checks:   Initial (08/12/22 1800)  Has TPA been given? No If patient is a Neuro Trauma and patient is going to OR before floor call report to 4N Charge nurse: [phone] or [phone]   R Recommendations: See Admitting Provider Note  Report given to:   Additional Notes: pt is more alert and closer to baseline than upon arrival

## 2022-08-12 NOTE — Subjective & Objective (Signed)
CC: aphasia HPI: 87 year old Caucasian male history of coronary disease, hypertension, diabetes, depression, history of TIAs presents to the ER today with acute onset of aphasia around 4:45 PM.  Patient lives at [facility] which is an assisted living.  He lives there with his wife as well as dementia.  Also reported that the patient had right-sided weakness.  Patient unable to give any history review of systems.  He does not recall having any aphasia or right-sided weakness.  Workup in the ER consisted of MRI of the brain which was negative for acute stroke, CT head which was negative for acute stroke, CTA head and neck which was negative for large vessel occlusion.  MRI did show prior old strokes of the bilateral flam eye, and chronic small vessel ischemic changes in the pons.  CTA of the neck did show approximately 70% stenosis of the right internal carotid.  50% stenosis of the right vertebral artery at the origin.  Neurology was consulted and recommended admission with an EEG.  I discussed the case with the patient's son [name] and patient's daughter-in-law Leotis Shames.  They are currently on vacation and cannot come to the hospital see the patient.  Confirmed with the son the patient is a DNR.  His MOST form was completed in November 2022 and patient was noted to be on comfort care at that time.  Son was not aware of comfort care status.  The patient's daughter-in-law Leotis Shames is the one that signed the MOST form.  [facility] hospitalist contacted for admission.

## 2022-08-12 NOTE — Assessment & Plan Note (Addendum)
On ASA and plavix

## 2022-08-12 NOTE — Assessment & Plan Note (Signed)
Continue imdur, toprol xl, asa, pravastatin.

## 2022-08-12 NOTE — Assessment & Plan Note (Signed)
Stable. On catapres patch, aldactone, norvace, imdur, zestril, toprol-xl

## 2022-08-12 NOTE — Assessment & Plan Note (Signed)
Stable.  Continue lexapro.  

## 2022-08-12 NOTE — Assessment & Plan Note (Signed)
On pravastatin 40 mg daily.

## 2022-08-12 NOTE — Code Documentation (Addendum)
Steve Ramirez is a 87 yr old male with a PMH of GI bleed, TIA, CVA presenting to [facility] via EMS on 08/12/2022. Pt is from an Assisted Living Facility where he had a witnessed onset of aphasia at 8 while with staff. Code stroke activated by EMS.  Pt is on Asa and plavix.    Pt met at bridge by stroke team. Pt 's labs and CBG obtained, airway cleared by EDP. Pt to CT with team. NIHSS 11. Pt with Aphasia, rt hemianopia, rt droop, and unable to answer orientation questions. The following imaging was obtained: CT, CTA/P, and MRI. Per Dr. Amada Jupiter, CT neg for hemorrhage. CTA neg for LVO. Per Dr. Amada Jupiter, MRI neg for stroke. Code stroke cancelled at 1820.

## 2022-08-12 NOTE — Progress Notes (Signed)
Patient isnt available for MRI at the moment due to being at Imaging.  Will try back later

## 2022-08-12 NOTE — Assessment & Plan Note (Signed)
    Verified DNR/DNI with pt's HCPOA(Jason Shartzer)

## 2022-08-12 NOTE — Assessment & Plan Note (Signed)
Hold metformin. Add SSI during hospital stay only.

## 2022-08-12 NOTE — Procedures (Signed)
  History: 87 year old man with history of aphasia and right sided weakness  EEG classification: Awake and drowsy  Description of the recording: The background rhythms of this recording consists of a fairly well modulated medium amplitude alpha rhythm of 8.5 Hz that is reactive to eye opening and closure. Present in the anterior head region is a 15-20 Hz beta activity. Photic stimulation was not performed. Hyperventilation was not performed. Drowsiness was manifested by background fragmentation. No abnormal epileptiform discharges seen during this recording. There was left temporal focal slowing. There were no electrographic seizure identified.   Abnormality: Left temporal slowing   Impression: This is an abnormal EEG recorded while drowsy and awake due to left temporal slowing. This is consistent with an area of neuronal dysfunction in the left temporal region.     Windell Norfolk, MD [facility]

## 2022-08-12 NOTE — Consult Note (Signed)
Neurology Consultation  Reason for Consult: Code stroke  Referring Physician: Dr Doran Durand  CC: aphasia and right side weakness   History is obtained from:EMS and medical record   HPI: Steve Ramirez is a 87 y.o. male with past medical history of CAD, HTN, prior strokes, DM GI bleed hx who presents from ALF via EMS for acute aphasia and right side weakness LKW @ 1645 witnessed by staff. EMS activated a Code stroke. NIHSS 11 On exam at the bridge patient nonverbal, slight right droop and right leg weakness. Code stroke CT scan no acute process, ASPECTS 10. CTA head and neck with perf no LVO.  Patient taken to MRI suite for STAT MRI brain w/o for treatment decision. In MRI suite he was able to say a few words.  Per son, and medical records he has had multiple similar issues in the past with speech arrest lasting anywhere up to 24 hours.   Of note, he has been seen multiple times in the past for transient episodes which his daughter calls "TIAs."  He will have transient episodes of difficulty speaking.  With least one episode, he states that he just "did not want to speak."  But it is clear during the episode that he is unable to.  Initially they would last for 30 minutes or so, but with the most recent episodes they have been lasting longer.  He has had two that lasted approximately 24 hours.  They are not certain of the exact number of times that this happened because they have lost count, but as of August 2022 he had had at least five at that time.  He states that he does not always seek care when these happen.  The first episode happened in 2016, but have been coming more frequent.  Over the past 6 months, his speech has become more halting even at baseline, sometimes taking a second or two to respond to questions.  LKW: 1645 IV thrombolysis given?: no, no acute stroke  EVT:  No LVO Premorbid modified Rankin scale (mRS): 2  ROS:  Unable to obtain due to altered mental status.   Past  Medical History:  Diagnosis Date   Coronary atherosclerosis of native coronary artery    Essential hypertension, benign    GIB (gastrointestinal bleeding)    Colonic diverticular bleed   History of melanoma    History of TIAs    Stroke (HCC)    Type 2 diabetes mellitus (HCC)      Family History  Problem Relation Age of Onset   Coronary artery disease Father    Asthma Other    Emphysema Mother    Coronary artery disease Paternal Uncle    Brain cancer Brother      Social History:   reports that he quit smoking about 42 years ago. His smoking use included cigarettes. He has never used smokeless tobacco. He reports current alcohol use. He reports that he does not use drugs.  Medications No current facility-administered medications for this encounter.  Current Outpatient Medications:    amLODipine (NORVASC) 10 MG tablet, Take 1 tablet (10 mg total) by mouth daily., Disp: 30 tablet, Rfl: 3   aspirin EC 81 MG tablet, Take 1 tablet (81 mg total) by mouth 2 (two) times daily. For DVT prophylaxis for 30 days after surgery., Disp: 60 tablet, Rfl: 0   betamethasone dipropionate 0.05 % cream, Apply 1 Application topically 2 (two) times daily as needed., Disp: , Rfl:    cetirizine (ZYRTEC)  10 MG tablet, Take 10 mg by mouth daily., Disp: , Rfl:    cloNIDine (CATAPRES - DOSED IN MG/24 HR) 0.3 mg/24hr patch, Place 1 patch (0.3 mg total) onto the skin once a week., Disp: 4 patch, Rfl: 12   clopidogrel (PLAVIX) 75 MG tablet, Take 1 tablet (75 mg total) by mouth daily., Disp: 30 tablet, Rfl: 3   cyanocobalamin (VITAMIN B12) 1000 MCG tablet, Take 1 tablet (1,000 mcg total) by mouth daily., Disp: 30 tablet, Rfl: 0   escitalopram (LEXAPRO) 20 MG tablet, Take 1 tablet (20 mg total) by mouth daily., Disp: 30 tablet, Rfl: 3   ferrous sulfate 325 (65 FE) MG tablet, Take 1 tablet (325 mg total) by mouth daily with breakfast., Disp: 30 tablet, Rfl: 3   ipratropium-albuterol (DUONEB) 0.5-2.5 (3) MG/3ML  SOLN, Take 3 mLs by nebulization every 4 (four) hours as needed., Disp: 360 mL, Rfl:    isosorbide mononitrate (IMDUR) 30 MG 24 hr tablet, Take 1 tablet (30 mg total) by mouth every evening., Disp: 30 tablet, Rfl: 3   levothyroxine (SYNTHROID) 50 MCG tablet, Take 1 tablet (50 mcg total) by mouth daily., Disp: 90 tablet, Rfl: 0   lisinopril (ZESTRIL) 40 MG tablet, Take 1 tablet (40 mg total) by mouth daily., Disp: 90 tablet, Rfl: 3   metFORMIN (GLUCOPHAGE) 1000 MG tablet, Take 1 tablet (1,000 mg total) by mouth 2 (two) times daily with a meal., Disp: 60 tablet, Rfl: 3   metoprolol succinate (TOPROL-XL) 25 MG 24 hr tablet, Take 1 tablet (25 mg total) by mouth every evening., Disp: 30 tablet, Rfl: 3   Multiple Vitamin (MULTIVITAMIN WITH MINERALS) TABS tablet, Take 1 tablet by mouth daily., Disp: 30 tablet, Rfl: 3   nitroGLYCERIN (NITROSTAT) 0.4 MG SL tablet, Place 1 tablet (0.4 mg total) under the tongue every 5 (five) minutes as needed for chest pain., Disp: 60 tablet, Rfl: 12   Omega-3 Fatty Acids (FISH OIL) 1000 MG CAPS, Take 1,000 mg by mouth at bedtime., Disp: , Rfl:    pravastatin (PRAVACHOL) 20 MG tablet, Take 1 tablet (20 mg total) by mouth at bedtime., Disp: 30 tablet, Rfl: 3   risperiDONE (RISPERDAL) 0.5 MG tablet, Take 1 tablet (0.5 mg total) by mouth 2 (two) times daily at 8 am and 4 pm., Disp: 60 tablet, Rfl: 3   rOPINIRole (REQUIP) 0.5 MG tablet, Take 2 tablets (1 mg total) by mouth at bedtime., Disp: 60 tablet, Rfl: 0   spironolactone (ALDACTONE) 25 MG tablet, Take 1 tablet (25 mg total) by mouth daily., Disp: 30 tablet, Rfl: 3   Exam: Current vital signs: Wt 83.6 kg   BMI 28.02 kg/m  Vital signs in last 24 hours: Weight:  [83.6 kg] 83.6 kg (07/31 1700)  GENERAL: Awake, alert in NAD  NEURO:  Mental Status: AA&Ox self. Stated age as "87".  He has a significant aphasia, does not follow commands or answer questions. Language: speech is clear but limited.   Cranial Nerves: PERRL  EOMI, he does not blink to threat from the right, slight right facial asymmetry, facial sensation intact, hearing intact, tongue/uvula/soft palate midline, normal sternocleidomastoid and trapezius muscle strength. No evidence of tongue atrophy or fibrillations Motor: bilateral uppers 5/5, left lower 5/5, right lower 4/5 Tone: is normal and bulk is normal Sensation- Intact to light touch bilaterally Coordination: FTN intact bilaterally, no ataxia in BLE. Gait- deferred  NIHSS 1a Level of Conscious.: 0 1b LOC Questions: 2 1c LOC Commands: 0 2 Best Gaze: 0  3 Visual: 2 4 Facial Palsy: 1 5a Motor Arm - left: 0 5b Motor Arm - Right: 0 6a Motor Leg - Left: 0 6b Motor Leg - Right: 1 7 Limb Ataxia: 0 8 Sensory: 0 9 Best Language: 3 10 Dysarthria: 2 11 Extinct. and Inatten.: 0 TOTAL: 11   Labs I have reviewed labs in epic and the results pertinent to this consultation are:  CBC    Component Value Date/Time   WBC 5.2 08/12/2022 1745   RBC 4.24 08/12/2022 1745   HGB 11.9 (L) 08/12/2022 1752   HCT 35.0 (L) 08/12/2022 1752   PLT 257 08/12/2022 1745   MCV 85.4 08/12/2022 1745   MCH 27.4 08/12/2022 1745   MCHC 32.0 08/12/2022 1745   RDW 13.9 08/12/2022 1745   LYMPHSABS 1.2 08/12/2022 1745   MONOABS 0.5 08/12/2022 1745   EOSABS 0.3 08/12/2022 1745   BASOSABS 0.1 08/12/2022 1745    CMP     Component Value Date/Time   NA 137 08/12/2022 1752   K 4.1 08/12/2022 1752   CL 104 08/12/2022 1752   CO2 23 11/05/2021 0303   GLUCOSE 198 (H) 08/12/2022 1752   BUN 17 08/12/2022 1752   CREATININE 0.80 08/12/2022 1752   CREATININE 1.03 05/07/2017 0917   CALCIUM 9.1 11/05/2021 0303   PROT 6.8 11/05/2021 0303   ALBUMIN 4.0 11/05/2021 0303   AST 20 11/05/2021 0303   ALT 21 11/05/2021 0303   ALKPHOS 50 11/05/2021 0303   BILITOT 0.5 11/05/2021 0303   GFRNONAA >60 11/05/2021 0303   GFRNONAA 69 05/07/2017 0917   GFRAA >60 10/10/2018 1011   GFRAA 80 05/07/2017 0917    Lipid Panel      Component Value Date/Time   CHOL 82 11/10/2020 0631   TRIG 113 11/10/2020 0631   HDL 29 (L) 11/10/2020 0631   CHOLHDL 2.8 11/10/2020 0631   VLDL 23 11/10/2020 0631   LDLCALC 30 11/10/2020 0631   LDLCALC 82 05/07/2017 0917    Lab Results  Component Value Date   HGBA1C 6.5 (H) 11/10/2020      Imaging I have reviewed the images obtained:  CT-head no acute process, Aspects 10   CTA head and neck with perf no LVO official read pending  MRI examination of the brain negative or acute process official read pending  Assessment:  87 y.o. male with past medical history of CAD, HTN, prior strokes, DM GI bleed hx who presents from ALF via EMS for acute aphasia and LKW @ 1645 witnessed by staff.  He has now had multiple recurrent episodes of a very similar nature with negative imaging.  I think at this point that transient ischemic attack is very unlikely, though he has had multiple ischemic strokes in the past.  With the abrupt onset of these episodes, and the stereotyped nature, the differential is relatively limited.  I think possibilities include focal seizure, transient focal neurological episodes(tfne) associated with dementia, less likely TIA due to the stereotyped recurrent nature without permanence.  Migraine is another cause of transient focal neurological episodes, but unclear if he had a history of that.   Recommendations: - Check EEG - continue home ASA, plavix and statin  - Check UA, infectious screen  Gevena Mart DNP, ACNPC-AG  [facility]  I have seen the patient and reviewed the above note.  He has a long history of recurrent episodes of a similar nature since 2016.  With negative imaging, I do not think that stroke is likely.  I would favor further workup with EEG, and observation to ensure resolution of his symptoms.  If EEG is negative, no need for LTM.  If he does have a negative EEG, I still think I might discuss empiric antiepileptic therapy with  his family and will consider Depakote. If discharged from ER, would consider starting depakote 500mg  BID, though I would favor observation  Ritta Slot, MD [facility] [phone]  If 7pm- 7am, please page neurology on call as listed in AMION.

## 2022-08-12 NOTE — ED Notes (Signed)
EEG still in progress

## 2022-08-12 NOTE — ED Triage Notes (Signed)
Pt to ed form assisted living with a CC of code stroke with right sided weakness. Pt is unable to speak. Pt will follow direction. Hx of stroke. LSN at 1645

## 2022-08-12 NOTE — ED Provider Notes (Signed)
[place] EMERGENCY DEPARTMENT AT [facility] Provider Note   CSN: [id number] Arrival date & time: 08/12/22  1745  An emergency department physician performed an initial assessment on this suspected stroke patient at 1748.  History Chief Complaint  Patient presents with   Code Stroke    HPI Steve Ramirez is a 87 y.o. male presenting for recurrent aphasia.   Patient's recorded medical, surgical, social, medication list and allergies were reviewed in the Snapshot window as part of the initial history.   Review of Systems   Review of Systems  Constitutional:  Negative for chills and fever.  HENT:  Negative for ear pain and sore throat.   Eyes:  Negative for pain and visual disturbance.  Respiratory:  Negative for cough and shortness of breath.   Cardiovascular:  Negative for chest pain and palpitations.  Gastrointestinal:  Negative for abdominal pain and vomiting.  Genitourinary:  Negative for dysuria and hematuria.  Musculoskeletal:  Negative for arthralgias and back pain.  Skin:  Negative for color change and rash.  Neurological:  Positive for speech difficulty. Negative for seizures and syncope.  All other systems reviewed and are negative.   Physical Exam Updated Vital Signs BP 138/66   Pulse 65   Resp 16   Wt 83.6 kg   SpO2 96%   BMI 28.02 kg/m  Physical Exam Vitals and nursing note reviewed.  Constitutional:      General: He is not in acute distress.    Appearance: He is well-developed.  HENT:     Head: Normocephalic and atraumatic.  Eyes:     Conjunctiva/sclera: Conjunctivae normal.  Cardiovascular:     Rate and Rhythm: Normal rate and regular rhythm.     Heart sounds: No murmur heard. Pulmonary:     Effort: Pulmonary effort is normal. No respiratory distress.     Breath sounds: Normal breath sounds.  Abdominal:     Palpations: Abdomen is soft.     Tenderness: There is no abdominal tenderness.  Musculoskeletal:        General: No  swelling.     Cervical back: Neck supple.  Skin:    General: Skin is warm and dry.     Capillary Refill: Capillary refill takes less than 2 seconds.  Neurological:     Mental Status: He is alert.  Psychiatric:        Mood and Affect: Mood normal.      ED Course/ Medical Decision Making/ A&P    Procedures .Critical Care  Performed by: Glyn Ade, MD Authorized by: Glyn Ade, MD   Critical care provider statement:    Critical care time (minutes):  30   Critical care was necessary to treat or prevent imminent or life-threatening deterioration of the following conditions:  CNS failure or compromise (Possible CVA with consideration of TNKase.)   Critical care was time spent personally by me on the following activities:  Development of treatment plan with patient or surrogate, discussions with consultants, evaluation of patient's response to treatment, examination of patient, ordering and review of laboratory studies, ordering and review of radiographic studies, ordering and performing treatments and interventions, pulse oximetry, re-evaluation of patient's condition and review of old charts    Medications Ordered in ED Medications  aspirin EC tablet 81 mg (has no administration in time range)  amLODipine (NORVASC) tablet 10 mg (has no administration in time range)  cloNIDine (CATAPRES - Dosed in mg/24 hr) patch 0.3 mg (has no administration in time range)  isosorbide mononitrate (IMDUR) 24 hr tablet 30 mg (has no administration in time range)  lisinopril (ZESTRIL) tablet 40 mg (has no administration in time range)  metoprolol succinate (TOPROL-XL) 24 hr tablet 25 mg (has no administration in time range)  escitalopram (LEXAPRO) tablet 20 mg (has no administration in time range)  risperiDONE (RISPERDAL) tablet 0.5 mg (has no administration in time range)  levothyroxine (SYNTHROID) tablet 50 mcg (has no administration in time range)  clopidogrel (PLAVIX) tablet 75 mg (has  no administration in time range)  melatonin tablet 3 mg (3 mg Oral Given 08/12/22 2345)  insulin aspart (novoLOG) injection 0-9 Units (has no administration in time range)  insulin aspart (novoLOG) injection 0-5 Units (has no administration in time range)  heparin injection 5,000 Units (has no administration in time range)  acetaminophen (TYLENOL) tablet 650 mg (has no administration in time range)    Or  acetaminophen (TYLENOL) suppository 650 mg (has no administration in time range)  ondansetron (ZOFRAN) tablet 4 mg (has no administration in time range)    Or  ondansetron (ZOFRAN) injection 4 mg (has no administration in time range)  iohexol (OMNIPAQUE) 350 MG/ML injection 100 mL (100 mLs Intravenous Contrast Given 08/12/22 1800)  Medical Decision Making:    Steve Ramirez is a 87 y.o. male  who presented to the ED today with aphasia.  Due to these symptoms, nursing activated a CODE STROKE per hospital protocol.    On my initial exam, the pt was in no acute distress, glucose was WNL and deficits include aphasia.  Deficits are  persistent.   Reviewed and confirmed nursing documentation for past medical history, family history, social history.    Initial Assessment and Plan:   Patient immediately evaluated jointly by neurology and emergency department providers.   This is most consistent with an acute life/limb threatening illness complicated by underlying chronic conditions.  Patient's HPI and PE findings are most consistent with potentially acute CVA. Proceeded with [facility] and CTA per institutional stroke policy which revealed no acute pathology. Given the timing and severity of the patient's symptoms, patient is a borderline TPA candidate per neurology.  However, there are parts of the history that are inconsistent.  In particular, he has had recurrence of the similar symptoms multiple times.  Neurology feels is less consistent with TIAs or CVAs given recurrent the same neurologic  anatomic location. They discussed this with the family who is in agreement.  They feel that the differential is more consistent with epileptiform or nonspecific etiology.  Initial Study Results: Labs Labs reviewed without evidence of clinically relevant abnormality.  EKG EKG was reviewed independently. Rate, rhythm, axis, intervals all examined and without medically relevant abnormality. ST segments without concerns for elevations.    Radiology  Images reviewed independently, agree with radiology report at this time.   MR BRAIN WO CONTRAST  Final Result    CT ANGIO HEAD NECK W WO CM W PERF (CODE STROKE)  Final Result    CT HEAD CODE STROKE WO CONTRAST  Final Result       Final Assessment and Plan:   Objective evaluation without acute pathology.  Neurology recommended admission for EEG/further underlying neurologic etiology.   Clinical Impression:  1. Slurred speech   2. Aphasia      Admit   Final Clinical Impression(s) / ED Diagnoses Final diagnoses:  Slurred speech  Aphasia    Rx / DC Orders ED Discharge Orders     None  Glyn Ade, MD 08/12/22 [phone]

## 2022-08-12 NOTE — Progress Notes (Signed)
Stat  EEG complete - results pending.  

## 2022-08-13 ENCOUNTER — Observation Stay (HOSPITAL_BASED_OUTPATIENT_CLINIC_OR_DEPARTMENT_OTHER): Payer: Medicare Other

## 2022-08-13 DIAGNOSIS — G459 Transient cerebral ischemic attack, unspecified: Secondary | ICD-10-CM

## 2022-08-13 DIAGNOSIS — G9341 Metabolic encephalopathy: Secondary | ICD-10-CM | POA: Diagnosis not present

## 2022-08-13 LAB — ECHOCARDIOGRAM COMPLETE
AR max vel: 2.4 cm2
AV Peak grad: 6.6 mmHg
Ao pk vel: 1.28 m/s
Area-P 1/2: 2.8 cm2
MV M vel: 2.55 m/s
MV Peak grad: 26 mmHg
S' Lateral: 2.8 cm
Weight: 2897.6 oz

## 2022-08-13 LAB — LIPID PANEL
Cholesterol: 105 mg/dL (ref 0–200)
HDL: 31 mg/dL — ABNORMAL LOW (ref >40)
LDL Cholesterol: 53 mg/dL (ref 0–99)
Total CHOL/HDL Ratio: 3.4 RATIO
Triglycerides: 105 mg/dL (ref <150)
VLDL: 21 mg/dL (ref 0–40)

## 2022-08-13 LAB — GLUCOSE, CAPILLARY
Glucose-Capillary: 110 mg/dL — ABNORMAL HIGH (ref 70–99)
Glucose-Capillary: 115 mg/dL — ABNORMAL HIGH (ref 70–99)
Glucose-Capillary: 117 mg/dL — ABNORMAL HIGH (ref 70–99)
Glucose-Capillary: 121 mg/dL — ABNORMAL HIGH (ref 70–99)
Glucose-Capillary: 98 mg/dL (ref 70–99)

## 2022-08-13 MED ORDER — PRAVASTATIN SODIUM 40 MG PO TABS
40.0000 mg | ORAL_TABLET | Freq: Every day | ORAL | Status: DC
  Administered 2022-08-13 – 2022-08-17 (×5): 40 mg via ORAL
  Filled 2022-08-13 (×5): qty 1

## 2022-08-13 MED ORDER — ORAL CARE MOUTH RINSE: 15.0000 mL | OROMUCOSAL | Status: DC | PRN

## 2022-08-13 MED ORDER — LEVETIRACETAM 500 MG PO TABS
500.0000 mg | ORAL_TABLET | Freq: Two times a day (BID) | ORAL | Status: DC
  Administered 2022-08-13 – 2022-08-17 (×9): 500 mg via ORAL
  Filled 2022-08-13 (×9): qty 1

## 2022-08-13 MED ORDER — SENNOSIDES-DOCUSATE SODIUM 8.6-50 MG PO TABS: 1.0000 | ORAL_TABLET | Freq: Every evening | ORAL | Status: DC | PRN

## 2022-08-13 MED ORDER — TRAZODONE HCL 50 MG PO TABS
50.0000 mg | ORAL_TABLET | Freq: Every evening | ORAL | Status: DC | PRN
  Administered 2022-08-14: 50 mg via ORAL
  Filled 2022-08-13: qty 1

## 2022-08-13 MED ORDER — GUAIFENESIN 100 MG/5ML PO LIQD: 5.0000 mL | ORAL | Status: DC | PRN

## 2022-08-13 MED ORDER — CALCIUM GLUCONATE-NACL 2-0.675 GM/100ML-% IV SOLN
2.0000 g | Freq: Once | INTRAVENOUS | Status: AC
  Administered 2022-08-13: 2000 mg via INTRAVENOUS
  Filled 2022-08-13: qty 100

## 2022-08-13 MED ORDER — LABETALOL HCL 5 MG/ML IV SOLN: 10.0000 mg | INTRAVENOUS | Status: DC | PRN

## 2022-08-13 MED ORDER — CLONIDINE HCL 0.3 MG/24HR TD PTWK
0.3000 mg | MEDICATED_PATCH | TRANSDERMAL | Status: DC
  Administered 2022-08-13: 0.3 mg via TRANSDERMAL
  Filled 2022-08-13: qty 1

## 2022-08-13 MED ORDER — IPRATROPIUM-ALBUTEROL 0.5-2.5 (3) MG/3ML IN SOLN: 3.0000 mL | RESPIRATORY_TRACT | Status: DC | PRN

## 2022-08-13 NOTE — Progress Notes (Signed)
PROGRESS NOTE    Steve Ramirez  XBM:[id number] DOB: [birth year] DOA: 08/12/2022 PCP: Assunta Found, MD   Brief Narrative:  87 year old with history of CAD, HTN, diabetes, depression, TIA comes to the ER with acute onset of aphasia around 4:45 PM on the day of admission.  He lives at an assisted living.  MRI was negative for acute CVA, CT head and CTA head and neck were also negative for LVO but did show prior old CVA and 70% right-sided internal carotid stenosis and 50% right vertebral artery origin stenosis.  Neurology recommended EEG which showed left temporal slowing   Assessment & Plan:  Principal Problem:   Acute metabolic encephalopathy Active Problems:   Essential hypertension, benign   Diabetes mellitus without complication (HCC)   Hyperlipidemia   History of TIAs   Coronary atherosclerosis of native coronary artery   Major depressive disorder, recurrent severe without psychotic features (HCC)   DNR (do not resuscitate)/DNI(Do Not Intubate)   Acute metabolic encephalopathy Acute onset of aphasia MRI brain, CT head and CTA head and neck are negative for acute pathology but did show prior old CVA and 70% right-sided internal carotid stenosis and 50% right vertebral artery origin stenosis.  Neurology recommended EEG which showed left temporal slowing Continue aspirin, Plavix, statin A1c 6.6, LDL 53 Echocardiogram = pending.    Major depressive disorder, recurrent severe without psychotic features (HCC) Stable. Continue lexapro   Coronary atherosclerosis of native coronary artery Currently chest pain-free Continue aspirin, Plavix, Imdur, lisinopril, Toprol-XL   History of TIAs Aspirin Plavix and statin   Hyperlipidemia On pravastatin 40 mg daily.   Diabetes mellitus without complication (HCC) Hold metformin. sliding scale   Essential hypertension, benign Stable. On catapres patch, aldactone, norvace, imdur, zestril, toprol-xl   DNR (do not resuscitate)/DNI(Do  Not Intubate)     DVT prophylaxis: Subcu heparin Code Status: DNR Family Communication:  Called son [facility] Continue hospital stay until PT/OT evaluation and neurology clearance       Diet Orders (From admission, onward)     Start     Ordered   08/12/22 2314  Diet heart healthy/carb modified Room service appropriate? Yes; Fluid consistency: Thin  Diet effective now       Question Answer Comment  Diet-HS Snack? Nothing   Room service appropriate? Yes   Fluid consistency: Thin      08/12/22 2313            Subjective: Patient seen at bedside, no new complaints. Slow with responding questions.   Examination:  General exam: Appears calm and comfortable  Respiratory system: Clear to auscultation. Respiratory effort normal. Cardiovascular system: S1 & S2 heard, RRR. No JVD, murmurs, rubs, gallops or clicks. No pedal edema. Gastrointestinal system: Abdomen is nondistended, soft and nontender. No organomegaly or masses felt. Normal bowel sounds heard. Central nervous system: Alert and oriented. No focal neurological deficits. Slow with responding questions.  Extremities: Symmetric 4 x 5 power. Skin: No rashes, lesions or ulcers Psychiatry: Judgement and insight appear poor.  Mood & affect appropriate.  Objective: Vitals:   08/12/22 2000 08/12/22 2220 08/13/22 0100 08/13/22 0500  BP: 138/66 (!) 156/73  (!) 128/102  Pulse: 65 62  61  Resp: 16 16    Temp:  97.7 F (36.5 C)  97.6 F (36.4 C)  TempSrc:  Oral Oral Oral  SpO2: 96% 98%  95%  Weight:    82.1 kg   No intake or output data in the 24 hours ending 08/13/22 0734  Filed Weights   08/28/2022 1700 08/13/22 0500  Weight: 83.6 kg 82.1 kg    Scheduled Meds:  amLODipine  10 mg Oral Daily   aspirin EC  81 mg Oral Daily   cloNIDine  0.3 mg Transdermal Weekly   clopidogrel  75 mg Oral Daily   escitalopram  20 mg Oral Daily   heparin  5,000 Units Subcutaneous Q8H   insulin aspart  0-5 Units Subcutaneous QHS    insulin aspart  0-9 Units Subcutaneous TID WC   isosorbide mononitrate  30 mg Oral QPM   levothyroxine  50 mcg Oral Daily   lisinopril  40 mg Oral Daily   melatonin  3 mg Oral QHS   metoprolol succinate  25 mg Oral QPM   risperiDONE  0.5 mg Oral [id number]   Continuous Infusions:  Nutritional status     Body mass index is 27.54 kg/m.  Data Reviewed:   CBC: Recent Labs  Lab 08/28/22 1745 2022/08/28 1752  WBC 5.2  --   NEUTROABS 3.1  --   HGB 11.6* 11.9*  HCT 36.2* 35.0*  MCV 85.4  --   PLT 257  --    Basic Metabolic Panel: Recent Labs  Lab 08/28/2022 1745 2022-08-28 1752 08/13/22 0440  NA 136 137 137  K 3.8 4.1 3.8  CL 106 104 104  CO2 17*  --  20*  GLUCOSE 194* 198* 103*  BUN 14 17 11   CREATININE 0.93 0.80 0.74  CALCIUM 8.5*  --  9.3   GFR: CrCl cannot be calculated (Unknown ideal weight.). Liver Function Tests: Recent Labs  Lab 08/28/2022 1745  AST 16  ALT 15  ALKPHOS 52  BILITOT 0.3  PROT 5.7*  ALBUMIN 3.5   No results for input(s): "LIPASE", "AMYLASE" in the last 168 hours. No results for input(s): "AMMONIA" in the last 168 hours. Coagulation Profile: Recent Labs  Lab 08/28/2022 1745  INR 1.0   Cardiac Enzymes: No results for input(s): "CKTOTAL", "CKMB", "CKMBINDEX", "TROPONINI" in the last 168 hours. BNP (last 3 results) No results for input(s): "PROBNP" in the last 8760 hours. HbA1C: Recent Labs    08/13/22 0440  HGBA1C 6.6*   CBG: Recent Labs  Lab 28-Aug-2022 1744 08/13/22 0043  GLUCAP 171* 98   Lipid Profile: No results for input(s): "CHOL", "HDL", "LDLCALC", "TRIG", "CHOLHDL", "LDLDIRECT" in the last 72 hours. Thyroid Function Tests: No results for input(s): "TSH", "T4TOTAL", "FREET4", "T3FREE", "THYROIDAB" in the last 72 hours. Anemia Panel: Recent Labs    08/13/22 0440 08/13/22 0442  VITAMINB12 539  --   FOLATE  --  30.3   Sepsis Labs: No results for input(s): "PROCALCITON", "LATICACIDVEN" in the last 168 hours.  No results  found for this or any previous visit (from the past 240 hour(s)).       Radiology Studies: EEG adult  Result Date: 08/28/2022 Windell Norfolk, MD     08-28-22  9:16 PM History: 87 year old man with history of aphasia and right sided weakness EEG classification: Awake and drowsy Description of the recording: The background rhythms of this recording consists of a fairly well modulated medium amplitude alpha rhythm of 8.5 Hz that is reactive to eye opening and closure. Present in the anterior head region is a 15-20 Hz beta activity. Photic stimulation was not performed. Hyperventilation was not performed. Drowsiness was manifested by background fragmentation. No abnormal epileptiform discharges seen during this recording. There was left temporal focal slowing. There were no electrographic seizure identified. Abnormality: Left  temporal slowing Impression: This is an abnormal EEG recorded while drowsy and awake due to left temporal slowing. This is consistent with an area of neuronal dysfunction in the left temporal region.  Windell Norfolk, MD [facility]   MR BRAIN WO CONTRAST  Result Date: 08/12/2022 CLINICAL DATA:  Acute stroke presentation, left hemisphere. EXAM: MRI HEAD WITHOUT CONTRAST TECHNIQUE: Multiplanar, multiecho pulse sequences of the brain and surrounding structures were obtained without intravenous contrast. COMPARISON:  CT studies same day.  MRI 11/10/2020 FINDINGS: Brain: Diffusion imaging does not show any acute or subacute infarction. Chronic small-vessel ischemic changes are seen throughout the pons. No focal cerebellar insult. Cerebral hemispheres show old infarctions of the thalami and confluent chronic small vessel ischemic changes of the white matter. No mass, hemorrhage, hydrocephalus or extra-axial collection. Vascular: Major vessels at the base of the brain show flow. Skull and upper cervical spine: Negative Sinuses/Orbits: Clear/normal Other: None IMPRESSION: No  acute finding by MRI. Extensive chronic small-vessel ischemic changes throughout the brain as outlined above. Electronically Signed   By: Paulina Fusi M.D.   On: 08/12/2022 18:35   CT ANGIO HEAD NECK W WO CM W PERF (CODE STROKE)  Result Date: 08/12/2022 CLINICAL DATA:  Acute stroke.  Right-sided weakness. EXAM: CT ANGIOGRAPHY HEAD AND NECK CT PERFUSION BRAIN TECHNIQUE: Multidetector CT imaging of the head and neck was performed using the standard protocol during bolus administration of intravenous contrast. Multiplanar CT image reconstructions and MIPs were obtained to evaluate the vascular anatomy. Carotid stenosis measurements (when applicable) are obtained utilizing NASCET criteria, using the distal internal carotid diameter as the denominator. Multiphase CT imaging of the brain was performed following IV bolus contrast injection. Subsequent parametric perfusion maps were calculated using RAPID software. RADIATION DOSE REDUCTION: This exam was performed according to the departmental dose-optimization program which includes automated exposure control, adjustment of the mA and/or kV according to patient size and/or use of iterative reconstruction technique. CONTRAST:  OMNIPAQUE IOHEXOL 350 MG/ML SOLN COMPARISON:  Head CT immediately prior FINDINGS: CTA NECK FINDINGS Aortic arch: Branching pattern is normal. Aortic atherosclerosis. Brachiocephalic vessel origin atherosclerosis but without significant stenosis. Right carotid system: Common carotid artery shows some scattered plaque but is widely patent to the bifurcation. Calcified plaque at the carotid bifurcation and ICA bulb. Minimal diameter at the distal bulb is 1.5 mm. Compared to a more distal cervical ICA diameter of 4.7 mm, This indicates a stenosis of 70%. Left carotid system: Common carotid artery shows some scattered plaque but is widely patent to the bifurcation. Calcified plaque at the carotid bifurcation and ICA bulb. Minimal diameter at the  proximal ICA measures 3 mm. Compared to a more distal cervical ICA diameter of 4.5 mm, this indicates a 33% stenosis. Vertebral arteries: Calcified plaque at the right vertebral artery origin with stenosis estimated at 50%. No stenosis of the dominant left vertebral artery origin. Both vessels are patent beyond that through the cervical region to the foramen magnum. Skeleton: Previous cervical fusion surgeries. Degenerative changes above and below that. Other neck: No mass or lymphadenopathy. Upper chest: Mild upper lobe scarring. Review of the MIP images confirms the above findings CTA HEAD FINDINGS Anterior circulation: Both internal carotid arteries are patent through the skull base and siphon regions. Ordinary siphon atherosclerotic change with stenosis estimated at 30% on both sides. The anterior and middle cerebral vessels are patent. No large vessel occlusion. Mild stenosis of the left M1 segment. Areas of atherosclerotic narrowing in the more distal branch  vessels. Posterior circulation: Both vertebral arteries are patent through the foramen magnum. The small right vertebral artery gives a minor contribution to the basilar artery. The dominant left vertebral artery shows atherosclerotic plaque in the V4 segment but no stenosis greater than 30%. Mild atherosclerotic irregularity of the basilar artery but without significant stenosis. Superior cerebellar and posterior cerebral arteries are patent. Atherosclerotic narrowing and irregularity is seen within both PCA vessels. Venous sinuses: Patent and normal. Anatomic variants: None significant. Review of the MIP images confirms the above findings CT Brain Perfusion Findings: ASPECTS: 10 CBF (<30%) Volume: 0mL Perfusion (Tmax>6.0s) volume: 0mL Mismatch Volume: 0mL Infarction Location:None IMPRESSION: 1. No large vessel occlusion. 2. Aortic atherosclerosis. 3. Atherosclerotic disease at both carotid bifurcations. 70% stenosis of the distal right ICA bulb. 33%  stenosis of the proximal left ICA. 4. 50% stenosis of the right vertebral artery origin. 5. Atherosclerotic disease in the intracranial circulation. 30% stenosis of both carotid siphon regions. Mild stenosis of the left M1 segment. Areas of atherosclerotic narrowing and irregularity in the more distal branch vessels. 6. Atherosclerotic disease in the V4 segment of the left vertebral artery but without stenosis greater than 30%. 7. Atherosclerotic disease in the basilar artery but without significant stenosis. 8. No perfusion abnormality. Aortic Atherosclerosis (ICD10-I70.0). Electronically Signed   By: Paulina Fusi M.D.   On: 08/12/2022 18:12   CT HEAD CODE STROKE WO CONTRAST  Result Date: 08/12/2022 CLINICAL DATA:  Code stroke. EXAM: CT HEAD WITHOUT CONTRAST TECHNIQUE: Contiguous axial images were obtained from the base of the skull through the vertex without intravenous contrast. RADIATION DOSE REDUCTION: This exam was performed according to the departmental dose-optimization program which includes automated exposure control, adjustment of the mA and/or kV according to patient size and/or use of iterative reconstruction technique. COMPARISON:  12/02/2020 FINDINGS: Brain: Chronic brain atrophy with widespread chronic small-vessel ischemic changes of the pons and cerebral hemispheric white matter. Old lacunar infarctions of the thalami I left more than right. No visible acute cortical infarction. No mass, hemorrhage, hydrocephalus or extra-axial collection. Vascular: There is atherosclerotic calcification of the major vessels at the base of the brain. Skull: Negative Sinuses/Orbits: Clear/normal Other: None ASPECTS (Alberta Stroke Program Early CT Score) - Ganglionic level infarction (caudate, lentiform nuclei, internal capsule, insula, M1-M3 cortex): 7 - Supraganglionic infarction (M4-M6 cortex): 3 Total score (0-10 with 10 being normal): 10 IMPRESSION: 1. No acute CT finding. Atrophy and chronic small-vessel  ischemic changes as outlined above. 2. Aspects is 10. These results were communicated to Dr. Amada Jupiter at 5:55 pm on 08/12/2022 by text page via the [facility] messaging system. Electronically Signed   By: Paulina Fusi M.D.   On: 08/12/2022 17:56           LOS: 0 days   Time spent= 35 mins    Maygan Koeller Joline Maxcy, MD [facility]  If 7PM-7AM, please contact night-coverage  08/13/2022, 7:34 AM

## 2022-08-13 NOTE — Progress Notes (Signed)
Echocardiogram 2D Echocardiogram has been performed.  Steve Ramirez 08/13/2022, 11:36 AM

## 2022-08-13 NOTE — Progress Notes (Signed)
Patient arrived on unit from ED awake and alert. Patient settled into bed, cleaned and new linen placed. Skin assessment completed with [facility] nurse. Patient alert to person and place, not oriented to situation and time. Unable to complete admission assessment at this time. Physical assessment complete. Patient without distress. Will continue to monitor according to plan of care and orders.

## 2022-08-13 NOTE — Progress Notes (Signed)
Clonidine patch removed at this time from R deltoid.

## 2022-08-13 NOTE — Plan of Care (Signed)
Patient currently alert and oriented to self and place. Disoriented to time and situation. No distress noted, no complaints voiced. Will continue to monitor according to plan of care and orders.  Problem: Coping: Goal: Ability to adjust to condition or change in health will improve Outcome: Progressing   Problem: Metabolic: Goal: Ability to maintain appropriate glucose levels will improve Outcome: Progressing   Problem: Skin Integrity: Goal: Risk for impaired skin integrity will decrease Outcome: Progressing   Problem: Tissue Perfusion: Goal: Adequacy of tissue perfusion will improve Outcome: Progressing   Problem: Education: Goal: Knowledge of General Education information will improve Description: Including pain rating scale, medication(s)/side effects and non-pharmacologic comfort measures Outcome: Progressing   Problem: Clinical Measurements: Goal: Ability to maintain clinical measurements within normal limits will improve Outcome: Progressing Goal: Will remain free from infection Outcome: Progressing Goal: Diagnostic test results will improve Outcome: Progressing   Problem: Coping: Goal: Level of anxiety will decrease Outcome: Progressing   Problem: Safety: Goal: Ability to remain free from injury will improve Outcome: Progressing   Problem: Skin Integrity: Goal: Risk for impaired skin integrity will decrease Outcome: Progressing

## 2022-08-13 NOTE — Progress Notes (Signed)
Subjective: He is lying in bed, no acute neurological events overnight. Responsive and oriented.   Exam: Vitals:   08/12/22 2220 08/13/22 0500  BP: (!) 156/73 (!) 128/102  Pulse: 62 61  Resp: 16   Temp: 97.7 F (36.5 C) 97.6 F (36.4 C)  SpO2: 98% 95%   Gen: In bed, NAD Resp: non-labored breathing, no acute distress Abd: soft, nt  Neuro: MS: AA&Ox3 (place, month/year, age). Delayed, but correct, responses to orientation/discussion questions. Able to state that he lives with his wife, and that they have been married for 57 years.  WU:[id number], EOMI, facial movement symmetric and sensation intact, hearing intact to voice, tongue/uvula/soft palate midline, normal sternocleidomastoid and trapezius muscle strength. No evidence of tongue atrophy or fibrillations  Motor: BUE 5/5, LLE 4+/5, RLE 5/5 Sensory:intact to light touch throughout Coordination: FTN intact bilaterally Gait: deferred.  (Patient states he uses a roll-a-tor at home, pending PT/OT inpatient eval)  Pertinent Labs:  LDL: 53 A1c: 6.6 UA: pending, needs to be collected UDS: pending, needs to be collected  MRI: Negative for acute process. Extensive chronic small-vessel disease.  CTA: No LVO. 70% R ICA stenosis, 50% Right vertebral artery origin stenosis CTH: negative acute.   EEG: left temporal slowing, no seizures seen.   ECHO: pending  Impression: 87 y.o. male with past medical history of CAD, HTN, prior strokes, DM GI bleed hx who presents from ALF via EMS for acute aphasia and LKW @ 1645 witnessed by staff.  Agree with infectious workup, as this could be reason for encephalopathy Given that he has had numerous, recurrent similar episodes since 2016 (all with negative imaging), we will speak with family about possibly starting him on empiric AED therapy.   Recommendations: 1)Continue infectious workup, mgmt per primary 2) Possibly start empiric Keppra therapy--pending speaking with family.    Pt seen by Neuro  NP/APP and later by MD. Note/plan to be edited by MD as needed.    Lynnae January, DNP, AGACNP-BC [facility] Please use AMION for contact information & EPIC for messaging.  I have seen the patient reviewed the above note.  He has slightly delayed responses, this is baseline per family.  With recurrent stereotyped episodes, no history of migraine, no evidence of amyloid on MRI, I think that recurrent focal seizures has to be a consideration.  At this point I would favor starting empiric Keppra to see if it is able to prevent these episodes in the future.  At this point, no further neurodiagnostic testing, start Keppra 500 mg twice daily, neurology will be available on an as-needed basis please call with further questions or concerns.  Ritta Slot, MD [facility] [phone]  If 7pm- 7am, please page neurology on call as listed in AMION.

## 2022-08-13 NOTE — Evaluation (Signed)
Physical Therapy Evaluation Patient Details Name: Steve Ramirez MRN: [id number] DOB: 1935-06-16 Today's Date: 08/13/2022  History of Present Illness  87 yo male presents to [facility] on 7/31 from ALF with c/c of acute aphasia and R weakness. MRI, CTH, CTA negative for acute findings. 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 Additional workup for AME. PMH includes CAD, HTN, prior strokes, DM, GIB.  Clinical Impression   Pt presents with generalized weakness, poor sitting and standing balance with posterior bias, impaired activity tolerance, impaired gait, AMS but unsure of pt baseline. Pt to benefit from acute PT to address deficits. Pt ambulated short room distance, overall requiring min-mod physical assist given posterior bias and weakness. Pt states PTA he did not get help with ADLs or mobility, unsure of accuracy. If pt has assist for all mobility and ADLs, appropriate to return to ALF. Otherwise, would encourage pt/family to pursue rehab. PT to progress mobility as tolerated, and will continue to follow acutely.          If plan is discharge home, recommend the following: A lot of help with walking and/or transfers;A lot of help with bathing/dressing/bathroom;Direct supervision/assist for medications management;Direct supervision/assist for financial management   Can travel by private vehicle        Equipment Recommendations None recommended by PT  Recommendations for Other Services       Functional Status Assessment Patient has had a recent decline in their functional status and demonstrates the ability to make significant improvements in function in a reasonable and predictable amount of time.     Precautions / Restrictions Precautions Precautions: Fall Restrictions Weight Bearing Restrictions: No      Mobility  Bed Mobility Overal bed mobility: Needs  Assistance Bed Mobility: Supine to Sit, Sit to Supine     Supine to sit: Mod assist Sit to supine: Mod assist   General bed mobility comments: assist for trunk and LE management, scooting to/from EOB    Transfers Overall transfer level: Needs assistance Equipment used: Rolling walker (2 wheels) Transfers: Sit to/from Stand Sit to Stand: Min assist           General transfer comment: assist for rise and steady, stand x5 from EOB and pt with posterior bias    Ambulation/Gait Ambulation/Gait assistance: Min assist Gait Distance (Feet): 15 Feet Assistive device: Rolling walker (2 wheels) Gait Pattern/deviations: Step-through pattern, Decreased stride length, Trunk flexed Gait velocity: decr     General Gait Details: assist to steady and manage RW, cues for RW management and navigating room. posterior bias which improved with verbal and tactile cuing  Stairs            Wheelchair Mobility     Tilt Bed    Modified Rankin (Stroke Patients Only)       Balance Overall balance assessment: Needs assistance, History of Falls Sitting-balance support: No upper extremity supported, Feet supported Sitting balance-Leahy Scale: Poor Sitting balance - Comments: fair to poor, with posterior bias requiring PT assist to correct x2 occasions Postural control: Posterior lean Standing balance support: Bilateral upper extremity supported, During functional activity Standing balance-Leahy Scale: Poor Standing balance comment: reliant on external assist                             Pertinent Vitals/Pain Pain Assessment Pain Assessment: No/denies pain  Home Living Family/patient expects to be discharged to:: Assisted living                 Home Equipment: Rollator (4 wheels);Rolling Walker (2 wheels);Shower seat;Grab bars - toilet;Grab bars - tub/shower      Prior Function Prior Level of Function : Independent/Modified Independent              Mobility Comments: pt reports walking with a rollator, pt reports no falls recently "believe it or not" ADLs Comments: pt states he would like to have help, but typically does ADLs without assist     Hand Dominance   Dominant Hand: Right    Extremity/Trunk Assessment   Upper Extremity Assessment Upper Extremity Assessment: Defer to OT evaluation    Lower Extremity Assessment Lower Extremity Assessment: Generalized weakness (3+/5 bilat, symmetrical)    Cervical / Trunk Assessment Cervical / Trunk Assessment: Kyphotic  Communication   Communication: Expressive difficulties;Other (comment) (slowed, occasionally slurred speech)  Cognition Arousal/Alertness: Awake/alert Behavior During Therapy: Flat affect Overall Cognitive Status: Impaired/Different from baseline                                 General Comments: a&Ox4, very slow processing which worsened as session wore on. Pt becomes perseverative and has difficulty terminating tasks (eating, sit<>stands). Very flat affect        General Comments      Exercises     Assessment/Plan    PT Assessment Patient needs continued PT services  PT Problem List Decreased strength;Decreased mobility;Decreased safety awareness;Decreased activity tolerance;Decreased balance;Decreased knowledge of use of DME;Decreased cognition;Decreased knowledge of precautions       PT Treatment Interventions DME instruction;Therapeutic activities;Patient/family education;Therapeutic exercise;Gait training;Balance training;Functional mobility training;Neuromuscular re-education    PT Goals (Current goals can be found in the Care Plan section)  Acute Rehab PT Goals Patient Stated Goal: home to wife PT Goal Formulation: With patient Time For Goal Achievement: 08/27/22 Potential to Achieve Goals: Good    Frequency Min 1X/week     Co-evaluation               AM-PAC PT "6 Clicks" Mobility  Outcome Measure Help needed  turning from your back to your side while in a flat bed without using bedrails?: A Lot Help needed moving from lying on your back to sitting on the side of a flat bed without using bedrails?: A Lot Help needed moving to and from a bed to a chair (including a wheelchair)?: A Lot Help needed standing up from a chair using your arms (e.g., wheelchair or bedside chair)?: A Little Help needed to walk in hospital room?: A Little Help needed climbing 3-5 steps with a railing? : A Lot 6 Click Score: 14    End of Session   Activity Tolerance: Patient tolerated treatment well Patient left: with call bell/phone within reach;in bed;with bed alarm set Nurse Communication: Mobility status PT Visit Diagnosis: Other abnormalities of gait and mobility (R26.89);Muscle weakness (generalized) (M62.81)    Time: 4540-9811 PT Time Calculation (min) (ACUTE ONLY): 29 min   Charges:   PT Evaluation $PT Eval Low Complexity: 1 Low PT Treatments $Therapeutic Activity: 8-22 mins PT General Charges $$ ACUTE PT VISIT: 1 Visit         Marye Round, PT DPT Acute Rehabilitation Services Secure Chat Preferred  Office [phone]   Dezzie Badilla E Christain [place] 08/13/2022, 4:16 PM

## 2022-08-14 DIAGNOSIS — G9341 Metabolic encephalopathy: Secondary | ICD-10-CM | POA: Diagnosis not present

## 2022-08-14 LAB — GLUCOSE, CAPILLARY
Glucose-Capillary: 131 mg/dL — ABNORMAL HIGH (ref 70–99)
Glucose-Capillary: 203 mg/dL — ABNORMAL HIGH (ref 70–99)
Glucose-Capillary: 144 mg/dL — ABNORMAL HIGH (ref 70–99)
Glucose-Capillary: 149 mg/dL — ABNORMAL HIGH (ref 70–99)

## 2022-08-14 NOTE — Evaluation (Addendum)
Occupational Therapy Evaluation Patient Details Name: Steve Ramirez MRN: [id number] DOB: [birth year] Today's Date: 08/14/2022   History of Present Illness 87 yo male presents to [facility] on 7/31 from ALF with c/c of acute aphasia and R weakness. MRI, CTH, CTA negative for acute findings. 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 Additional workup for AME. PMH includes CAD, HTN, prior strokes, DM, GIB.   Clinical Impression   PTA, pt from ALF, reports typically ambulatory with Rollator and able to manage ADLs. Pt presents now with deficits in cognition, sitting/standing balance, strength and endurance. Pt requiring constant step by step cueing to complete ADLs/mobility with very slow processing noted. Pt requires Mod A for bed mobility, Min-Mod A for mobility with RW and Mod-Total A for ADLs d/t deficits. Patient will benefit from continued inpatient follow up therapy, <3 hours/day at DC unless ALF can provide the 24/7 physical/cognitive assist for all ADLs/mobility that pt currently requires.     Recommendations for follow up therapy are one component of a multi-disciplinary discharge planning process, led by the attending physician.  Recommendations may be updated based on patient status, additional functional criteria and insurance authorization.   Assistance Recommended at Discharge Frequent or constant Supervision/Assistance  Patient can return home with the following A lot of help with walking and/or transfers;A lot of help with bathing/dressing/bathroom;Assistance with cooking/housework;Direct supervision/assist for medications management;Direct supervision/assist for financial management    Functional Status Assessment  Patient has had a recent decline in their functional status and demonstrates the ability to make significant improvements in function in a reasonable and  predictable amount of time.  Equipment Recommendations  Other (comment) (TBD)    Recommendations for Other Services       Precautions / Restrictions Precautions Precautions: Fall;Other (comment) Precaution Comments: incontinent Restrictions Weight Bearing Restrictions: No      Mobility Bed Mobility Overal bed mobility: Needs Assistance Bed Mobility: Supine to Sit, Sit to Supine     Supine to sit: Mod assist Sit to supine: Mod assist   General bed mobility comments: assist for trunk and LE management, scooting to/from EOB    Transfers Overall transfer level: Needs assistance Equipment used: Rolling walker (2 wheels) Transfers: Sit to/from Stand Sit to Stand: Min assist                  Balance Overall balance assessment: Needs assistance, History of Falls Sitting-balance support: Feet supported, No upper extremity supported Sitting balance-Leahy Scale: Poor Sitting balance - Comments: initially Min A progressing to supervision Postural control: Posterior lean Standing balance support: Bilateral upper extremity supported, During functional activity Standing balance-Leahy Scale: Poor                             ADL either performed or assessed with clinical judgement   ADL Overall ADL's : Needs assistance/impaired Eating/Feeding: Set up   Grooming: Minimal assistance;Standing;Wash/dry face;Brushing hair Grooming Details (indicate cue type and reason): consistent cues to sequence, move on from wringing out washcloth and placing washcloth in hand to initiate next step Upper Body Bathing: Moderate assistance;Sitting   Lower Body Bathing: Moderate assistance;Sitting/lateral leans;Sit to/from stand   Upper Body Dressing : Moderate assistance;Sitting   Lower Body Dressing: Maximal assistance;Sit to/from stand   Toilet Transfer: Minimal assistance;Ambulation;Rolling walker (2 wheels);Moderate assistance   Toileting- Clothing Manipulation and  Hygiene: Sit  to/from stand;Total assistance Toileting - Clothing Manipulation Details (indicate cue type and reason): for posterior hygiene standing at sink; pt with BM on floor and unaware     Functional mobility during ADLs: Minimal assistance;Rolling walker (2 wheels);Cueing for sequencing;Cueing for safety;Moderate assistance       Vision Baseline Vision/History: 1 Wears glasses Ability to See in Adequate Light: 0 Adequate Patient Visual Report: No change from baseline Vision Assessment?: No apparent visual deficits     Perception     Praxis      Pertinent Vitals/Pain Pain Assessment Pain Assessment: No/denies pain     Hand Dominance Right   Extremity/Trunk Assessment Upper Extremity Assessment Upper Extremity Assessment: Generalized weakness   Lower Extremity Assessment Lower Extremity Assessment: Defer to PT evaluation   Cervical / Trunk Assessment Cervical / Trunk Assessment: Kyphotic   Communication Communication Communication: Expressive difficulties;Other (comment) (slow speech)   Cognition Arousal/Alertness: Awake/alert Behavior During Therapy: Flat affect Overall Cognitive Status: Impaired/Different from baseline Area of Impairment: Attention, Following commands, Memory, Safety/judgement, Awareness, Problem solving                   Current Attention Level: Focused, Sustained Memory: Decreased short-term memory Following Commands: Follows one step commands with increased time Safety/Judgement: Decreased awareness of safety, Decreased awareness of deficits Awareness: Intellectual Problem Solving: Slow processing, Requires tactile cues, Requires verbal cues, Difficulty sequencing, Decreased initiation General Comments: very slow processing, requires constant step by step cues to manage tasks; perseverating on wringing out washcloth standing at sink. had BM standing at sink though unaware     General Comments       Exercises     Shoulder  Instructions      Home Living Family/patient expects to be discharged to:: Assisted living                             Home Equipment: Rollator (4 wheels);Rolling Walker (2 wheels);Shower seat;Grab bars - toilet;Grab bars - tub/shower;Cane - single point   Additional Comments: at ALF with his wife per pt      Prior Functioning/Environment Prior Level of Function : Patient poor historian/Family not available             Mobility Comments: pt reports walking with a rollator, ADLs Comments: Reports able to manage ADLs; though unsure of accuracy        OT Problem List: Decreased strength;Decreased activity tolerance;Impaired balance (sitting and/or standing);Decreased cognition;Decreased safety awareness;Decreased knowledge of use of DME or AE      OT Treatment/Interventions: Self-care/ADL training;Therapeutic exercise;Energy conservation;DME and/or AE instruction;Therapeutic activities;Patient/family education    OT Goals(Current goals can be found in the care plan section) Acute Rehab OT Goals Patient Stated Goal: none stated today; agreeable for OOB activity OT Goal Formulation: With patient Time For Goal Achievement: 08/28/22 Potential to Achieve Goals: Good ADL Goals Pt Will Perform Lower Body Bathing: with min assist;sit to/from stand Pt Will Perform Lower Body Dressing: with min assist;sit to/from stand Pt Will Transfer to Toilet: with min guard assist;ambulating Additional ADL Goal #1: Pt to complete 2 step trail making task without verbal cues  OT Frequency: Min 1X/week    Co-evaluation              AM-PAC OT "6 Clicks" Daily Activity     Outcome Measure Help from another person eating meals?: A Little Help from another person taking care of personal grooming?: A Little Help from another  person toileting, which includes using toliet, bedpan, or urinal?: Total Help from another person bathing (including washing, rinsing, drying)?: A Lot Help from  another person to put on and taking off regular upper body clothing?: A Lot Help from another person to put on and taking off regular lower body clothing?: A Lot 6 Click Score: 13   End of Session Equipment Utilized During Treatment: Gait belt;Rolling walker (2 wheels) Nurse Communication: Mobility status  Activity Tolerance: Patient tolerated treatment well Patient left: in bed;with call bell/phone within reach;with bed alarm set  OT Visit Diagnosis: Unsteadiness on feet (R26.81);Other abnormalities of gait and mobility (R26.89);Muscle weakness (generalized) (M62.81)                Time: 1610-9604 OT Time Calculation (min): 29 min Charges:  OT General Charges $OT Visit: 1 Visit OT Evaluation $OT Eval Moderate Complexity: 1 Mod OT Treatments $Self Care/Home Management : 8-22 mins  Bradd Canary, OTR/L Acute Rehab Services Office: [phone]   Lorre Munroe 08/14/2022, 12:41 PM

## 2022-08-14 NOTE — [place] FL2 (Addendum)
[place] MEDICAID FL2 LEVEL OF CARE FORM     IDENTIFICATION  Patient Name: Steve Ramirez Birthdate: August 05, 1935 Sex: male Admission Date (Current Location): 08/12/2022  [facility] and [place] Number:  Producer, television/film/video and Address:  [facility] [place], [address] [place], [place] [address]      Provider Number: [id number]  Attending Physician Name and Address:  Dimple Nanas, MD  Relative Name and Phone Number:       Current Level of Care: Hospital Recommended Level of Care: Skilled Nursing Facility Prior Approval Number:    Date Approved/Denied:   PASRR Number: [id number]  Discharge Plan: SNF    Current Diagnoses: Patient Active Problem List   Diagnosis Date Noted   DNR (do not resuscitate)/DNI(Do Not Intubate) 08/12/2022   Major depressive disorder, recurrent severe without psychotic features (HCC) 11/06/2021   Major depressive disorder, single episode, severe without psychosis (HCC) 11/06/2021   Type 2 diabetes mellitus with diabetic neuropathy, without long-term current use of insulin (HCC) 11/05/2021   Pressure injury of skin 11/12/2020   AMS (altered mental status) 11/11/2020   Altered mental status 11/09/2020   Hypertensive urgency 11/09/2020   Depression 10/03/2020   Hypothyroidism 10/03/2020   Acute metabolic encephalopathy 10/03/2020   CAD (coronary artery disease)    Primary osteoarthritis of left hip 09/14/2018   Primary localized osteoarthritis of hip 08/02/2018   Primary osteoarthritis of right hip 07/07/2018   Near syncope 12/03/2016   CVA (cerebral vascular accident) (HCC) 12/03/2016   Essential hypertension 12/02/2016   Dysphagia 12/02/2016   TIA (transient ischemic attack) 11/29/2014   Shoulder stiffness 09/05/2012   Right rotator cuff tear 08/16/2012   Shoulder subluxation, right 08/09/2012   Elevated transaminase level 11/05/2011   Coronary atherosclerosis of native coronary artery 11/26/2010   Essential  hypertension, benign 11/23/2010   Diabetes mellitus without complication (HCC) 11/23/2010   Hyperlipidemia 11/23/2010   History of TIAs 11/23/2010    Orientation RESPIRATION BLADDER Height & Weight     Self, Time, Situation, Place  Normal Continent Weight: 181 lb (82.1 kg) Height:     BEHAVIORAL SYMPTOMS/MOOD NEUROLOGICAL BOWEL NUTRITION STATUS      Continent    AMBULATORY STATUS COMMUNICATION OF NEEDS Skin   Extensive Assist   Normal                       Personal Care Assistance Level of Assistance  Bathing, Feeding, Dressing Bathing Assistance: Limited assistance Feeding assistance: Limited assistance Dressing Assistance: Limited assistance     Functional Limitations Info  Sight, Hearing, Speech Sight Info: Adequate Hearing Info: Adequate Speech Info: Adequate    SPECIAL CARE FACTORS FREQUENCY  PT (By licensed PT), OT (By licensed OT)                    Contractures Contractures Info: Not present    Additional Factors Info                  Current Medications (08/14/2022):  This is the current hospital active medication list Current Facility-Administered Medications  Medication Dose Route Frequency Provider Last Rate Last Admin   acetaminophen (TYLENOL) tablet 650 mg  650 mg Oral Q6H PRN Carollee Herter, DO       Or   acetaminophen (TYLENOL) suppository 650 mg  650 mg Rectal Q6H PRN Carollee Herter, DO       amLODipine (NORVASC) tablet 10 mg  10 mg Oral Daily  Carollee Herter, DO   10 mg at 08/14/22 1021   aspirin EC tablet 81 mg  81 mg Oral Daily Carollee Herter, DO   81 mg at 08/14/22 1020   cloNIDine (CATAPRES - Dosed in mg/24 hr) patch 0.3 mg  0.3 mg Transdermal Weekly Reome, Earle J, RPH   0.3 mg at 08/13/22 1154   clopidogrel (PLAVIX) tablet 75 mg  75 mg Oral Daily Carollee Herter, DO   75 mg at 08/14/22 1020   escitalopram (LEXAPRO) tablet 20 mg  20 mg Oral Daily Carollee Herter, DO   20 mg at 08/14/22 1020   guaiFENesin (ROBITUSSIN) 100 MG/5ML liquid 5 mL  5 mL Oral Q4H  PRN Dimple Nanas, MD       heparin injection 5,000 Units  5,000 Units Subcutaneous Q8H Carollee Herter, DO   5,000 Units at 08/14/22 1309   insulin aspart (novoLOG) injection 0-5 Units  0-5 Units Subcutaneous QHS Carollee Herter, DO       insulin aspart (novoLOG) injection 0-9 Units  0-9 Units Subcutaneous TID WC Carollee Herter, DO   3 Units at 08/14/22 1230   ipratropium-albuterol (DUONEB) 0.5-2.5 (3) MG/3ML nebulizer solution 3 mL  3 mL Nebulization Q4H PRN Amin, Ankit Chirag, MD       isosorbide mononitrate (IMDUR) 24 hr tablet 30 mg  30 mg Oral QPM Carollee Herter, DO   30 mg at 08/13/22 1830   labetalol (NORMODYNE) injection 10 mg  10 mg Intravenous Q2H PRN Dimple Nanas, MD       levETIRAcetam (KEPPRA) tablet 500 mg  500 mg Oral BID Rejeana Brock, MD   500 mg at 08/14/22 1020   levothyroxine (SYNTHROID) tablet 50 mcg  50 mcg Oral Daily Carollee Herter, DO   50 mcg at 08/14/22 0617   lisinopril (ZESTRIL) tablet 40 mg  40 mg Oral Daily Carollee Herter, DO   40 mg at 08/14/22 1019   melatonin tablet 3 mg  3 mg Oral QHS Carollee Herter, DO   3 mg at 08/13/22 2130   metoprolol succinate (TOPROL-XL) 24 hr tablet 25 mg  25 mg Oral QPM Carollee Herter, DO   25 mg at 08/13/22 1830   ondansetron (ZOFRAN) tablet 4 mg  4 mg Oral Q6H PRN Carollee Herter, DO       Or   ondansetron [facility]) injection 4 mg  4 mg Intravenous Q6H PRN Carollee Herter, DO       Oral care mouth rinse  15 mL Mouth Rinse PRN Carollee Herter, DO       pravastatin (PRAVACHOL) tablet 40 mg  40 mg Oral Daily Amin, Ankit Chirag, MD   40 mg at 08/14/22 1020   risperiDONE (RISPERDAL) tablet 0.5 mg  0.5 mg Oral [id number] Carollee Herter, DO   0.5 mg at 08/14/22 1020   senna-docusate (Senokot-S) tablet 1 tablet  1 tablet Oral QHS PRN Amin, Ankit Chirag, MD       traZODone (DESYREL) tablet 50 mg  50 mg Oral QHS PRN Amin, Loura Halt, MD         Discharge Medications: Please see discharge summary for a list of discharge medications.  Relevant Imaging Results:  Relevant  Lab Results:   Additional Information SS# [id number]  Deatra Robinson, [place]

## 2022-08-14 NOTE — Plan of Care (Signed)
  Problem: Education: Goal: Ability to describe self-care measures that may prevent or decrease complications (Diabetes Survival Skills Education) will improve Outcome: Progressing   Problem: Coping: Goal: Ability to adjust to condition or change in health will improve Outcome: Progressing   Problem: Fluid Volume: Goal: Ability to maintain a balanced intake and output will improve Outcome: Progressing   Problem: Health Behavior/Discharge Planning: Goal: Ability to identify and utilize available resources and services will improve Outcome: Progressing Goal: Ability to manage health-related needs will improve Outcome: Progressing   Problem: Metabolic: Goal: Ability to maintain appropriate glucose levels will improve Outcome: Progressing   Problem: Nutritional: Goal: Maintenance of adequate nutrition will improve Outcome: Progressing Goal: Progress toward achieving an optimal weight will improve Outcome: Progressing   Problem: Skin Integrity: Goal: Risk for impaired skin integrity will decrease Outcome: Progressing   Problem: Tissue Perfusion: Goal: Adequacy of tissue perfusion will improve Outcome: Progressing   Problem: Education: Goal: Knowledge of General Education information will improve Description: Including pain rating scale, medication(s)/side effects and non-pharmacologic comfort measures Outcome: Progressing   Problem: Health Behavior/Discharge Planning: Goal: Ability to manage health-related needs will improve Outcome: Progressing   Problem: Clinical Measurements: Goal: Ability to maintain clinical measurements within normal limits will improve Outcome: Progressing Goal: Will remain free from infection Outcome: Progressing Goal: Diagnostic test results will improve Outcome: Progressing Goal: Respiratory complications will improve Outcome: Progressing Goal: Cardiovascular complication will be avoided Outcome: Progressing   Problem: Activity: Goal:  Risk for activity intolerance will decrease Outcome: Progressing  Patient had an uneventful night. Slept well. No distress noted, no complaints voiced. Patient bathed, linens changed. Patient tolerated medications without incident. Will continue to monitor closely.  Problem: Nutrition: Goal: Adequate nutrition will be maintained Outcome: Progressing   Problem: Coping: Goal: Level of anxiety will decrease Outcome: Progressing   Problem: Elimination: Goal: Will not experience complications related to bowel motility Outcome: Progressing Goal: Will not experience complications related to urinary retention Outcome: Progressing   Problem: Safety: Goal: Ability to remain free from injury will improve Outcome: Progressing   Problem: Skin Integrity: Goal: Risk for impaired skin integrity will decrease Outcome: Progressing

## 2022-08-14 NOTE — Progress Notes (Signed)
Physical Therapy Treatment Patient Details Name: Steve Ramirez MRN: [id number] DOB: 1935-05-21 Today's Date: 08/14/2022   History of Present Illness 87 yo male presents to [facility] on 7/31 from ALF with c/c of acute aphasia and R weakness. MRI, CTH, CTA negative for acute findings. 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 Additional workup for AME. PMH includes CAD, HTN, prior strokes, DM, GIB.    PT Comments  Pt seen for PT tx with pt agreeable. Pt is AxOx4 but presents with very poor overall awareness, safety awareness, & initiation. Pt requires mod assist for bed mobility, mod assist STS, & ambulates in room with RW & min<>mod assist. Pt reports prior to admission he was able to dress himself & ambulate with rollator. On this date, pt requires significant cognitive assistance and physical assistance. Recommend ongoing PT services to address deficits to increase independence & decrease fall risk with mobility.    If plan is discharge home, recommend the following: A lot of help with walking and/or transfers;A lot of help with bathing/dressing/bathroom;Direct supervision/assist for medications management;Direct supervision/assist for financial management   Can travel by private vehicle     Yes  Equipment Recommendations  None recommended by PT (defer to next venue of care)    Recommendations for Other Services       Precautions / Restrictions Precautions Precautions: Fall;Other (comment) Precaution Comments: incontinent Restrictions Weight Bearing Restrictions: No     Mobility  Bed Mobility Overal bed mobility: Needs Assistance Bed Mobility: Supine to Sit     Supine to sit: Mod assist, HOB elevated     General bed mobility comments: Cuing & assistance to move BLE to EOB, holds to PT's hand to upright trunk with HOB elevated, use of bed rail as well.     Transfers Overall transfer level: Needs assistance Equipment used: Rolling walker (2 wheels) Transfers: Sit to/from Stand Sit to Stand: Mod assist           General transfer comment: Unable to follow instructions/cuing to scoot out to edge of seat prior to STS. Pt transfers STS from EOB & recliner with RW & mod assist.    Ambulation/Gait Ambulation/Gait assistance: Min assist, Mod assist Gait Distance (Feet): 5 Feet (+ 25 ft) Assistive device: Rolling walker (2 wheels) Gait Pattern/deviations: Decreased step length - right, Decreased dorsiflexion - right, Decreased stride length, Decreased dorsiflexion - left, Decreased step length - left Gait velocity: decreased     General Gait Details: Pt with decreased initiation of stepping requiring PT to give RW forward movement then pt able to step, pt requires assistance for RW management when turning, cuing for hand placement during STS.   Stairs             Wheelchair Mobility     Tilt Bed    Modified Rankin (Stroke Patients Only)       Balance Overall balance assessment: Needs assistance, History of Falls Sitting-balance support: Feet supported, No upper extremity supported Sitting balance-Leahy Scale: Poor Sitting balance - Comments: is able to progress to supervision for static sitting Postural control: Posterior lean Standing balance support: Bilateral upper extremity supported, During functional activity, Reliant on assistive device for balance Standing balance-Leahy Scale: Poor Standing balance comment: unaware of posterior lean, reports he's leaning forwards  Cognition Arousal/Alertness: Awake/alert Behavior During Therapy: Flat affect Overall Cognitive Status: Impaired/Different from baseline Area of Impairment: Attention, Following commands, Memory, Safety/judgement, Awareness, Problem solving                 Orientation Level:  (oriented x 4) Current  Attention Level: Focused, Sustained Memory: Decreased short-term memory Following Commands: Follows one step commands with increased time, Follows one step commands inconsistently Safety/Judgement: Decreased awareness of safety, Decreased awareness of deficits Awareness: Intellectual Problem Solving: Slow processing, Requires tactile cues, Requires verbal cues, Difficulty sequencing, Decreased initiation General Comments: Pt reports he's from a group home, states he's 87 vs 87 y/o. Pt with very poor initiation with all mobility tasks.        Exercises      General Comments        Pertinent Vitals/Pain Pain Assessment Pain Assessment: Faces Faces Pain Scale: No hurt    Home Living Family/patient expects to be discharged to:: Assisted living                 Home Equipment: Rollator (4 wheels);Rolling Walker (2 wheels);Shower seat;Grab bars - toilet;Grab bars - tub/shower;Cane - single point Additional Comments: at ALF with his wife per pt    Prior Function            PT Goals (current goals can now be found in the care plan section) Acute Rehab PT Goals Patient Stated Goal: none stated PT Goal Formulation: With patient Time For Goal Achievement: 08/27/22 Potential to Achieve Goals: Fair Progress towards PT goals: Progressing toward goals    Frequency    Min 1X/week      PT Plan Discharge plan needs to be updated    Co-evaluation              AM-PAC PT "6 Clicks" Mobility   Outcome Measure  Help needed turning from your back to your side while in a flat bed without using bedrails?: A Lot Help needed moving from lying on your back to sitting on the side of a flat bed without using bedrails?: A Lot Help needed moving to and from a bed to a chair (including a wheelchair)?: A Lot Help needed standing up from a chair using your arms (e.g., wheelchair or bedside chair)?: A Lot Help needed to walk in hospital room?: A Lot Help needed climbing 3-5 steps  with a railing? : A Lot 6 Click Score: 12    End of Session Equipment Utilized During Treatment: Gait belt Activity Tolerance: Patient tolerated treatment well Patient left: in chair;with chair alarm set;with call bell/phone within reach Nurse Communication: Mobility status PT Visit Diagnosis: Other abnormalities of gait and mobility (R26.89);Muscle weakness (generalized) (M62.81);Difficulty in walking, not elsewhere classified (R26.2)     Time: 1610-9604 PT Time Calculation (min) (ACUTE ONLY): 24 min  Charges:    $Gait Training: 8-22 mins $Therapeutic Activity: 8-22 mins PT General Charges $$ ACUTE PT VISIT: 1 Visit                     Aleda Grana, PT, DPT 08/14/22, 1:47 PM   Sandi Mariscal 08/14/2022, 1:46 PM

## 2022-08-14 NOTE — Plan of Care (Signed)

## 2022-08-14 NOTE — Progress Notes (Addendum)
Pt admitted from [facility]. Pt plans to return to [facility]. Per MD, pt ready for dc today. Reached out to [facility] at 10:15am. Await return call from [place], Charity fundraiser at [employer]. Will provide updates as available.   UPDATE 1245: call received from [facility] at [facility] who reports she will need to come assess pt prior to his return. Requested assessment take place today given pt's OBSERVATION status. Pt and pt's son [name] currently refusing SNF and plan for return to [facility].    UPDATE 1545: Porsche with [facility] [phone]) assessed pt at bedside and determined he will need SNF prior to return. Pt and pt's son agreeable to SNF at this time and requesting [facility] where pt's wife recently had a rehab stay. Will f/u with offers as available. Pt will need auth for SNF. Pt with existing [facility].   Dellie Burns, MSW, LCSW [phone] (coverage)

## 2022-08-14 NOTE — Progress Notes (Deleted)
Steve Ramirez DOB: September 16, 1935  To Whom It May Concern:  Please be advised that the above-named patient will require a short-term nursing home stay - anticipated 30 days or less for rehabilitation and strengthening.  The plan is for return home.

## 2022-08-14 NOTE — Progress Notes (Signed)
PROGRESS NOTE    Steve Ramirez  YQM:[id number] DOB: 1935-12-03 DOA: 08/12/2022 PCP: Assunta Found, MD   Brief Narrative:  87 year old with history of CAD, HTN, diabetes, depression, TIA comes to the ER with acute onset of aphasia around 4:45 PM on the day of admission.  He lives at an assisted living.  MRI was negative for acute CVA, CT head and CTA head and neck were also negative for LVO but did show prior old CVA and 70% right-sided internal carotid stenosis and 50% right vertebral artery origin stenosis.  Neurology recommended EEG which showed left temporal slowing.  Neurology.  We started patient on 500 mg of Keppra twice daily.   Assessment & Plan:  Principal Problem:   Acute metabolic encephalopathy Active Problems:   Essential hypertension, benign   Diabetes mellitus without complication (HCC)   Hyperlipidemia   History of TIAs   Coronary atherosclerosis of native coronary artery   Major depressive disorder, recurrent severe without psychotic features (HCC)   DNR (do not resuscitate)/DNI(Do Not Intubate)   Acute metabolic encephalopathy Acute onset of aphasia MRI brain, CT head and CTA head and neck are negative for acute pathology but did show prior old CVA and 70% right-sided internal carotid stenosis and 50% right vertebral artery origin stenosis.  Neurology recommended EEG which showed left temporal slowing Continue aspirin, Plavix, statin A1c 6.6, LDL 53 Echocardiogram = preserved EF  Major depressive disorder, recurrent severe without psychotic features (HCC) Stable. Continue lexapro   Coronary atherosclerosis of native coronary artery Currently chest pain-free Continue aspirin, Plavix, Imdur, lisinopril, Toprol-XL   History of TIAs Aspirin Plavix and statin   Hyperlipidemia On pravastatin 40 mg daily.   Diabetes mellitus without complication (HCC) Hold metformin. sliding scale   Essential hypertension, benign Stable. On catapres patch, aldactone,  norvace, imdur, zestril, toprol-xl   DNR (do not resuscitate)/DNI(Do Not Intubate)     DVT prophylaxis: Subcu heparin Code Status: DNR Family Communication:  Called son [name] Pending confirmation to see if he can return back to his facility.        Diet Orders (From admission, onward)     Start     Ordered   08/12/22 2314  Diet heart healthy/carb modified Room service appropriate? Yes; Fluid consistency: Thin  Diet effective now       Question Answer Comment  Diet-HS Snack? Nothing   Room service appropriate? Yes   Fluid consistency: Thin      08/12/22 2313            Subjective: Doing better no complaints.   Examination:  Constitutional: Not in acute distress Respiratory: Clear to auscultation bilaterally Cardiovascular: Normal sinus rhythm, no rubs Abdomen: Nontender nondistended good bowel sounds Musculoskeletal: No edema noted Skin: No rashes seen Neurologic: CN 2-12 grossly intact.  And nonfocal Psychiatric: Normal judgment and insight. Alert and oriented x 3. Normal mood.     Objective: Vitals:   08/13/22 2336 08/14/22 0329 08/14/22 0457 08/14/22 0746  BP: (!) 141/65 (!) 166/90  (!) 170/68  Pulse: 60 68  64  Resp: 19 20  18   Temp: 98.5 F (36.9 C) 98.3 F (36.8 C)  98.5 F (36.9 C)  TempSrc: Oral Oral  Oral  SpO2: 95% 96%  100%  Weight:   82.1 kg     Intake/Output Summary (Last 24 hours) at 08/14/2022 1205 Last data filed at 08/14/2022 1024 Gross per 24 hour  Intake 350 ml  Output 2025 ml  Net -1675 ml  Filed Weights   08/12/22 1700 08/13/22 0500 08/14/22 0457  Weight: 83.6 kg 82.1 kg 82.1 kg    Scheduled Meds:  amLODipine  10 mg Oral Daily   aspirin EC  81 mg Oral Daily   cloNIDine  0.3 mg Transdermal Weekly   clopidogrel  75 mg Oral Daily   escitalopram  20 mg Oral Daily   heparin  5,000 Units Subcutaneous Q8H   insulin aspart  0-5 Units Subcutaneous QHS   insulin aspart  0-9 Units Subcutaneous TID WC   isosorbide mononitrate   30 mg Oral QPM   levETIRAcetam  500 mg Oral BID   levothyroxine  50 mcg Oral Daily   lisinopril  40 mg Oral Daily   melatonin  3 mg Oral QHS   metoprolol succinate  25 mg Oral QPM   pravastatin  40 mg Oral Daily   risperiDONE  0.5 mg Oral [id number]   Continuous Infusions:  Nutritional status     Body mass index is 27.52 kg/m.  Data Reviewed:   CBC: Recent Labs  Lab 08/12/22 1745 08/12/22 1752  WBC 5.2  --   NEUTROABS 3.1  --   HGB 11.6* 11.9*  HCT 36.2* 35.0*  MCV 85.4  --   PLT 257  --    Basic Metabolic Panel: Recent Labs  Lab 08/12/22 1745 08/12/22 1752 08/13/22 0440 08/14/22 0354  NA 136 137 137 138  K 3.8 4.1 3.8 4.6  CL 106 104 104 103  CO2 17*  --  20* 27  GLUCOSE 194* 198* 103* 127*  BUN 14 17 11 13   CREATININE 0.93 0.80 0.74 0.77  CALCIUM 8.5*  --  9.3 9.7  MG  --   --   --  1.9   GFR: CrCl cannot be calculated (Unknown ideal weight.). Liver Function Tests: Recent Labs  Lab 08/12/22 1745  AST 16  ALT 15  ALKPHOS 52  BILITOT 0.3  PROT 5.7*  ALBUMIN 3.5   No results for input(s): "LIPASE", "AMYLASE" in the last 168 hours. No results for input(s): "AMMONIA" in the last 168 hours. Coagulation Profile: Recent Labs  Lab 08/12/22 1745  INR 1.0   Cardiac Enzymes: No results for input(s): "CKTOTAL", "CKMB", "CKMBINDEX", "TROPONINI" in the last 168 hours. BNP (last 3 results) No results for input(s): "PROBNP" in the last 8760 hours. HbA1C: Recent Labs    08/13/22 0440  HGBA1C 6.6*   CBG: Recent Labs  Lab 08/13/22 1215 08/13/22 1629 08/13/22 2114 08/14/22 0609 08/14/22 1143  GLUCAP 117* 121* 110* 131* 203*   Lipid Profile: Recent Labs    08/13/22 0442  CHOL 105  HDL 31*  LDLCALC 53  TRIG 623  CHOLHDL 3.4   Thyroid Function Tests: No results for input(s): "TSH", "T4TOTAL", "FREET4", "T3FREE", "THYROIDAB" in the last 72 hours. Anemia Panel: Recent Labs    08/13/22 0440 08/13/22 0442  VITAMINB12 539  --   FOLATE  --   30.3   Sepsis Labs: No results for input(s): "PROCALCITON", "LATICACIDVEN" in the last 168 hours.  No results found for this or any previous visit (from the past 240 hour(s)).       Radiology Studies: ECHOCARDIOGRAM COMPLETE  Result Date: 08/13/2022    ECHOCARDIOGRAM REPORT   Patient Name:   DUILIO HERITAGE Date of Exam: 08/13/2022 Medical Rec #:  [id number]           Height:       68.0 in Accession #:    [id number]  Weight:       181.1 lb Date of Birth:  07-Jun-1935          BSA:          1.959 m Patient Age:    86 years            BP:           127/66 mmHg Patient Gender: M                   HR:           61 bpm. Exam Location:  Inpatient Procedure: 2D Echo, Cardiac Doppler and Color Doppler Indications:    TIA G45.9  History:        Patient has prior history of Echocardiogram examinations, most                 recent 10/25/2020. CAD, TIA and Stroke; Risk                 Factors:Hypertension, Diabetes and Dyslipidemia.  Sonographer:    Lucendia Herrlich Referring Phys: [id number]  CHIRAG  IMPRESSIONS  1. Left ventricular ejection fraction, by estimation, is 55 to 60%. The left ventricle has normal function. The left ventricle has no regional wall motion abnormalities. There is mild concentric left ventricular hypertrophy. Left ventricular diastolic parameters are indeterminate.  2. Right ventricular systolic function is normal. The right ventricular size is normal. There is normal pulmonary artery systolic pressure.  3. Left atrial size was mild to moderately dilated.  4. The mitral valve is normal in structure. Trivial mitral valve regurgitation. No evidence of mitral stenosis.  5. The aortic valve is tricuspid. There is mild calcification of the aortic valve. There is mild thickening of the aortic valve. Aortic valve regurgitation is trivial. Aortic valve sclerosis/calcification is present, without any evidence of aortic stenosis.  6. The inferior vena cava is normal in size with  greater than 50% respiratory variability, suggesting right atrial pressure of 3 mmHg. Comparison(s): No significant change from prior study. Conclusion(s)/Recommendation(s): Otherwise normal echocardiogram, with minor abnormalities described in the report. FINDINGS  Left Ventricle: Left ventricular ejection fraction, by estimation, is 55 to 60%. The left ventricle has normal function. The left ventricle has no regional wall motion abnormalities. The left ventricular internal cavity size was normal in size. There is  mild concentric left ventricular hypertrophy. Left ventricular diastolic parameters are indeterminate. Right Ventricle: The right ventricular size is normal. Right vetricular wall thickness was not well visualized. Right ventricular systolic function is normal. There is normal pulmonary artery systolic pressure. The tricuspid regurgitant velocity is 1.68 m/s, and with an assumed right atrial pressure of 3 mmHg, the estimated right ventricular systolic pressure is 14.3 mmHg. Left Atrium: Left atrial size was mild to moderately dilated. Right Atrium: Right atrial size was normal in size. Pericardium: There is no evidence of pericardial effusion. Mitral Valve: The mitral valve is normal in structure. Trivial mitral valve regurgitation. No evidence of mitral valve stenosis. Tricuspid Valve: The tricuspid valve is normal in structure. Tricuspid valve regurgitation is trivial. No evidence of tricuspid stenosis. Aortic Valve: The aortic valve is tricuspid. There is mild calcification of the aortic valve. There is mild thickening of the aortic valve. Aortic valve regurgitation is trivial. Aortic valve sclerosis/calcification is present, without any evidence of aortic stenosis. Aortic valve peak gradient measures 6.6 mmHg. Pulmonic Valve: The pulmonic valve was grossly normal. Pulmonic valve regurgitation is trivial. No evidence of pulmonic stenosis. Aorta:  The aortic root, ascending aorta and aortic arch are all  structurally normal, with no evidence of dilitation or obstruction. Venous: The inferior vena cava is normal in size with greater than 50% respiratory variability, suggesting right atrial pressure of 3 mmHg. IAS/Shunts: The atrial septum is grossly normal.  LEFT VENTRICLE PLAX 2D LVIDd:         4.40 cm   Diastology LVIDs:         2.80 cm   LV e' medial:    5.75 cm/s LV PW:         1.30 cm   LV E/e' medial:  10.2 LV IVS:        1.10 cm   LV e' lateral:   8.55 cm/s LVOT diam:     2.30 cm   LV E/e' lateral: 6.9 LV SV:         72 LV SV Index:   37 LVOT Area:     4.15 cm  RIGHT VENTRICLE             IVC RV S prime:     11.80 cm/s  IVC diam: 1.50 cm TAPSE (M-mode): 1.8 cm LEFT ATRIUM             Index        RIGHT ATRIUM           Index LA diam:        3.90 cm 1.99 cm/m   RA Area:     12.00 cm LA Vol (A2C):   55.0 ml 28.07 ml/m  RA Volume:   21.50 ml  10.97 ml/m LA Vol (A4C):   59.6 ml 30.42 ml/m LA Biplane Vol: 61.8 ml 31.54 ml/m  AORTIC VALVE AV Area (Vmax): 2.40 cm AV Vmax:        128.00 cm/s AV Peak Grad:   6.6 mmHg LVOT Vmax:      74.07 cm/s LVOT Vmean:     48.200 cm/s LVOT VTI:       0.173 m  AORTA Ao Root diam: 3.50 cm Ao Asc diam:  3.60 cm MITRAL VALVE               TRICUSPID VALVE MV Area (PHT): 2.80 cm    TR Peak grad:   11.3 mmHg MV Decel Time: 271 msec    TR Vmax:        168.00 cm/s MR Peak grad: 26.0 mmHg MR Vmax:      255.00 cm/s  SHUNTS MV E velocity: 58.60 cm/s  Systemic VTI:  0.17 m MV A velocity: 97.10 cm/s  Systemic Diam: 2.30 cm MV E/A ratio:  0.60 Jodelle Red MD Electronically signed by Jodelle Red MD Signature Date/Time: 08/13/2022/3:19:54 PM    Final    EEG adult  Result Date: 08/12/2022 Windell Norfolk, MD     08/12/2022  9:16 PM History: 87 year old man with history of aphasia and right sided weakness EEG classification: Awake and drowsy Description of the recording: The background rhythms of this recording consists of a fairly well modulated medium amplitude alpha  rhythm of 8.5 Hz that is reactive to eye opening and closure. Present in the anterior head region is a 15-20 Hz beta activity. Photic stimulation was not performed. Hyperventilation was not performed. Drowsiness was manifested by background fragmentation. No abnormal epileptiform discharges seen during this recording. There was left temporal focal slowing. There were no electrographic seizure identified. Abnormality: Left temporal slowing Impression: This is an abnormal EEG recorded while  drowsy and awake due to left temporal slowing. This is consistent with an area of neuronal dysfunction in the left temporal region.  Windell Norfolk, MD [facility]   MR BRAIN WO CONTRAST  Result Date: 08/12/2022 CLINICAL DATA:  Acute stroke presentation, left hemisphere. EXAM: MRI HEAD WITHOUT CONTRAST TECHNIQUE: Multiplanar, multiecho pulse sequences of the brain and surrounding structures were obtained without intravenous contrast. COMPARISON:  CT studies same day.  MRI 11/10/2020 FINDINGS: Brain: Diffusion imaging does not show any acute or subacute infarction. Chronic small-vessel ischemic changes are seen throughout the pons. No focal cerebellar insult. Cerebral hemispheres show old infarctions of the thalami and confluent chronic small vessel ischemic changes of the white matter. No mass, hemorrhage, hydrocephalus or extra-axial collection. Vascular: Major vessels at the base of the brain show flow. Skull and upper cervical spine: Negative Sinuses/Orbits: Clear/normal Other: None IMPRESSION: No acute finding by MRI. Extensive chronic small-vessel ischemic changes throughout the brain as outlined above. Electronically Signed   By: Paulina Fusi M.D.   On: 08/12/2022 18:35   CT ANGIO HEAD NECK W WO CM W PERF (CODE STROKE)  Result Date: 08/12/2022 CLINICAL DATA:  Acute stroke.  Right-sided weakness. EXAM: CT ANGIOGRAPHY HEAD AND NECK CT PERFUSION BRAIN TECHNIQUE: Multidetector CT imaging of the head and  neck was performed using the standard protocol during bolus administration of intravenous contrast. Multiplanar CT image reconstructions and MIPs were obtained to evaluate the vascular anatomy. Carotid stenosis measurements (when applicable) are obtained utilizing NASCET criteria, using the distal internal carotid diameter as the denominator. Multiphase CT imaging of the brain was performed following IV bolus contrast injection. Subsequent parametric perfusion maps were calculated using RAPID software. RADIATION DOSE REDUCTION: This exam was performed according to the departmental dose-optimization program which includes automated exposure control, adjustment of the mA and/or kV according to patient size and/or use of iterative reconstruction technique. CONTRAST:  OMNIPAQUE IOHEXOL 350 MG/ML SOLN COMPARISON:  Head CT immediately prior FINDINGS: CTA NECK FINDINGS Aortic arch: Branching pattern is normal. Aortic atherosclerosis. Brachiocephalic vessel origin atherosclerosis but without significant stenosis. Right carotid system: Common carotid artery shows some scattered plaque but is widely patent to the bifurcation. Calcified plaque at the carotid bifurcation and ICA bulb. Minimal diameter at the distal bulb is 1.5 mm. Compared to a more distal cervical ICA diameter of 4.7 mm, This indicates a stenosis of 70%. Left carotid system: Common carotid artery shows some scattered plaque but is widely patent to the bifurcation. Calcified plaque at the carotid bifurcation and ICA bulb. Minimal diameter at the proximal ICA measures 3 mm. Compared to a more distal cervical ICA diameter of 4.5 mm, this indicates a 33% stenosis. Vertebral arteries: Calcified plaque at the right vertebral artery origin with stenosis estimated at 50%. No stenosis of the dominant left vertebral artery origin. Both vessels are patent beyond that through the cervical region to the foramen magnum. Skeleton: Previous cervical fusion surgeries.  Degenerative changes above and below that. Other neck: No mass or lymphadenopathy. Upper chest: Mild upper lobe scarring. Review of the MIP images confirms the above findings CTA HEAD FINDINGS Anterior circulation: Both internal carotid arteries are patent through the skull base and siphon regions. Ordinary siphon atherosclerotic change with stenosis estimated at 30% on both sides. The anterior and middle cerebral vessels are patent. No large vessel occlusion. Mild stenosis of the left M1 segment. Areas of atherosclerotic narrowing in the more distal branch vessels. Posterior circulation: Both vertebral arteries are patent through the  foramen magnum. The small right vertebral artery gives a minor contribution to the basilar artery. The dominant left vertebral artery shows atherosclerotic plaque in the V4 segment but no stenosis greater than 30%. Mild atherosclerotic irregularity of the basilar artery but without significant stenosis. Superior cerebellar and posterior cerebral arteries are patent. Atherosclerotic narrowing and irregularity is seen within both PCA vessels. Venous sinuses: Patent and normal. Anatomic variants: None significant. Review of the MIP images confirms the above findings CT Brain Perfusion Findings: ASPECTS: 10 CBF (<30%) Volume: 0mL Perfusion (Tmax>6.0s) volume: 0mL Mismatch Volume: 0mL Infarction Location:None IMPRESSION: 1. No large vessel occlusion. 2. Aortic atherosclerosis. 3. Atherosclerotic disease at both carotid bifurcations. 70% stenosis of the distal right ICA bulb. 33% stenosis of the proximal left ICA. 4. 50% stenosis of the right vertebral artery origin. 5. Atherosclerotic disease in the intracranial circulation. 30% stenosis of both carotid siphon regions. Mild stenosis of the left M1 segment. Areas of atherosclerotic narrowing and irregularity in the more distal branch vessels. 6. Atherosclerotic disease in the V4 segment of the left vertebral artery but without stenosis  greater than 30%. 7. Atherosclerotic disease in the basilar artery but without significant stenosis. 8. No perfusion abnormality. Aortic Atherosclerosis (ICD10-I70.0). Electronically Signed   By: Paulina Fusi M.D.   On: 08/12/2022 18:12   CT HEAD CODE STROKE WO CONTRAST  Result Date: 08/12/2022 CLINICAL DATA:  Code stroke. EXAM: CT HEAD WITHOUT CONTRAST TECHNIQUE: Contiguous axial images were obtained from the base of the skull through the vertex without intravenous contrast. RADIATION DOSE REDUCTION: This exam was performed according to the departmental dose-optimization program which includes automated exposure control, adjustment of the mA and/or kV according to patient size and/or use of iterative reconstruction technique. COMPARISON:  12/02/2020 FINDINGS: Brain: Chronic brain atrophy with widespread chronic small-vessel ischemic changes of the pons and cerebral hemispheric white matter. Old lacunar infarctions of the thalami I left more than right. No visible acute cortical infarction. No mass, hemorrhage, hydrocephalus or extra-axial collection. Vascular: There is atherosclerotic calcification of the major vessels at the base of the brain. Skull: Negative Sinuses/Orbits: Clear/normal Other: None ASPECTS (Alberta Stroke Program Early CT Score) - Ganglionic level infarction (caudate, lentiform nuclei, internal capsule, insula, M1-M3 cortex): 7 - Supraganglionic infarction (M4-M6 cortex): 3 Total score (0-10 with 10 being normal): 10 IMPRESSION: 1. No acute CT finding. Atrophy and chronic small-vessel ischemic changes as outlined above. 2. Aspects is 10. These results were communicated to Dr. Amada Jupiter at 5:55 pm on 08/12/2022 by text page via the [facility] messaging system. Electronically Signed   By: Paulina Fusi M.D.   On: 08/12/2022 17:56           LOS: 0 days   Time spent= 35 mins     Joline Maxcy, MD [facility]  If 7PM-7AM, please contact night-coverage  08/14/2022, 12:05 PM

## 2022-08-15 ENCOUNTER — Other Ambulatory Visit: Payer: Self-pay

## 2022-08-15 ENCOUNTER — Encounter (HOSPITAL_COMMUNITY): Payer: Self-pay | Admitting: Internal Medicine

## 2022-08-15 DIAGNOSIS — G9341 Metabolic encephalopathy: Secondary | ICD-10-CM | POA: Diagnosis not present

## 2022-08-15 LAB — GLUCOSE, CAPILLARY
Glucose-Capillary: 139 mg/dL — ABNORMAL HIGH (ref 70–99)
Glucose-Capillary: 193 mg/dL — ABNORMAL HIGH (ref 70–99)
Glucose-Capillary: 156 mg/dL — ABNORMAL HIGH (ref 70–99)
Glucose-Capillary: 145 mg/dL — ABNORMAL HIGH (ref 70–99)

## 2022-08-15 MED ORDER — LEVETIRACETAM 500 MG PO TABS: 500.0000 mg | ORAL_TABLET | Freq: Two times a day (BID) | ORAL | Status: DC

## 2022-08-15 NOTE — TOC Progression Note (Signed)
Transition of Care [facility]) - Progression Note    Patient Details  Name: SALIH WILLIAMSON MRN: [id number] Date of Birth: 17-Dec-1935  Transition of Care [facility]) CM/SW Contact  Dellie Burns [place], [place] Phone Number: 08/15/2022, 9:41 AM  Clinical Narrative: Bed offer received from [facility], pt and pt's son have accepted. [employer] auth started, ref #[id number]. Notified Kenney Houseman in [facility] admissions.   Dellie Burns, MSW, LCSW [phone] (coverage)      Expected Discharge Plan: Skilled Nursing Facility Barriers to Discharge: Insurance Authorization  Expected Discharge Plan and Services                                               Social Determinants of Health (SDOH) Interventions SDOH Screenings   Food Insecurity: No Food Insecurity (08/15/2022)  Housing: Patient Declined (08/15/2022)  Transportation Needs: No Transportation Needs (08/15/2022)  Utilities: Not At Risk (08/15/2022)  Alcohol Screen: Low Risk  (11/06/2021)  Social Connections: Unknown (05/26/2021)   Received from [facility], [facility]  Tobacco Use: Medium Risk (08/15/2022)    Readmission Risk Interventions     No data to display

## 2022-08-15 NOTE — Progress Notes (Signed)
PROGRESS NOTE    Steve Ramirez  VWU:[id number] DOB: [birth year] DOA: 08/12/2022 PCP: Assunta Found, MD   Brief Narrative:  87 year old with history of CAD, HTN, diabetes, depression, TIA comes to the ER with acute onset of aphasia around 4:45 PM on the day of admission.  He lives at an assisted living.  MRI was negative for acute CVA, CT head and CTA head and neck were also negative for LVO but did show prior old CVA and 70% right-sided internal carotid stenosis and 50% right vertebral artery origin stenosis.  Neurology recommended EEG which showed left temporal slowing.  Neurology.  We started patient on 500 mg of Keppra twice daily.  Awaiting SNF placement.   Assessment & Plan:  Principal Problem:   Acute metabolic encephalopathy Active Problems:   Essential hypertension, benign   Diabetes mellitus without complication (HCC)   Hyperlipidemia   History of TIAs   Coronary atherosclerosis of native coronary artery   Major depressive disorder, recurrent severe without psychotic features (HCC)   DNR (do not resuscitate)/DNI(Do Not Intubate)   Acute metabolic encephalopathy Acute onset of aphasia MRI brain, CT head and CTA head and neck are negative for acute pathology but did show prior old CVA and 70% right-sided internal carotid stenosis and 50% right vertebral artery origin stenosis.  Neurology recommended EEG which showed left temporal slowing Continue aspirin, Plavix, statin A1c 6.6, LDL 53 Echocardiogram = preserved EF  Major depressive disorder, recurrent severe without psychotic features (HCC) Stable. Continue lexapro   Coronary atherosclerosis of native coronary artery Currently chest pain-free Continue aspirin, Plavix, Imdur, lisinopril, Toprol-XL   History of TIAs Aspirin Plavix and statin   Hyperlipidemia On pravastatin 40 mg daily.   Diabetes mellitus without complication (HCC) Hold metformin. sliding scale   Essential hypertension, benign Stable. On  catapres patch, aldactone, norvace, imdur, zestril, toprol-xl   DNR (do not resuscitate)/DNI(Do Not Intubate)     DVT prophylaxis: Subcu heparin Code Status: DNR Family Communication: Periodically updated son Being for SNF placement      Diet Orders (From admission, onward)     Start     Ordered   08/12/22 2314  Diet heart healthy/carb modified Room service appropriate? Yes; Fluid consistency: Thin  Diet effective now       Question Answer Comment  Diet-HS Snack? Nothing   Room service appropriate? Yes   Fluid consistency: Thin      08/12/22 2313            Subjective: Doing well no complaints  Examination:  Constitutional: Not in acute distress Respiratory: Clear to auscultation bilaterally Cardiovascular: Normal sinus rhythm, no rubs Abdomen: Nontender nondistended good bowel sounds Musculoskeletal: No edema noted Skin: No rashes seen Neurologic: CN 2-12 grossly intact.  And nonfocal Psychiatric: Normal judgment and insight. Alert and oriented x 3. Normal mood.     Objective: Vitals:   08/15/22 0500 08/15/22 0700 08/15/22 0800 08/15/22 1159  BP:   (!) 127/95 (!) 143/56  Pulse:   (!) 59 (!) 55  Resp:    18  Temp:   98.5 F (36.9 C) 98 F (36.7 C)  TempSrc:   Oral Oral  SpO2:   97% 98%  Weight: 82.1 kg     Height:  5\' 8"  (1.727 m)      Intake/Output Summary (Last 24 hours) at 08/15/2022 1310 Last data filed at 08/14/2022 2333 Gross per 24 hour  Intake --  Output 600 ml  Net -600 ml   [employer]  08/13/22 0500 08/14/22 0457 08/15/22 0500  Weight: 82.1 kg 82.1 kg 82.1 kg    Scheduled Meds:  amLODipine  10 mg Oral Daily   aspirin EC  81 mg Oral Daily   cloNIDine  0.3 mg Transdermal Weekly   clopidogrel  75 mg Oral Daily   escitalopram  20 mg Oral Daily   heparin  5,000 Units Subcutaneous Q8H   insulin aspart  0-5 Units Subcutaneous QHS   insulin aspart  0-9 Units Subcutaneous TID WC   isosorbide mononitrate  30 mg Oral QPM    levETIRAcetam  500 mg Oral BID   levothyroxine  50 mcg Oral Daily   lisinopril  40 mg Oral Daily   melatonin  3 mg Oral QHS   metoprolol succinate  25 mg Oral QPM   pravastatin  40 mg Oral Daily   risperiDONE  0.5 mg Oral [id number]   Continuous Infusions:  Nutritional status     Body mass index is 27.52 kg/m.  Data Reviewed:   CBC: Recent Labs  Lab 08/12/22 1745 08/12/22 1752  WBC 5.2  --   NEUTROABS 3.1  --   HGB 11.6* 11.9*  HCT 36.2* 35.0*  MCV 85.4  --   PLT 257  --    Basic Metabolic Panel: Recent Labs  Lab 08/12/22 1745 08/12/22 1752 08/13/22 0440 08/14/22 0354 08/15/22 0208  NA 136 137 137 138 135  K 3.8 4.1 3.8 4.6 4.2  CL 106 104 104 103 100  CO2 17*  --  20* 27 23  GLUCOSE 194* 198* 103* 127* 123*  BUN 14 17 11 13 20   CREATININE 0.93 0.80 0.74 0.77 0.94  CALCIUM 8.5*  --  9.3 9.7 9.4  MG  --   --   --  1.9 1.8   GFR: Estimated Creatinine Clearance: 61.1 mL/min (by C-G formula based on SCr of 0.94 mg/dL). Liver Function Tests: Recent Labs  Lab 08/12/22 1745  AST 16  ALT 15  ALKPHOS 52  BILITOT 0.3  PROT 5.7*  ALBUMIN 3.5   No results for input(s): "LIPASE", "AMYLASE" in the last 168 hours. No results for input(s): "AMMONIA" in the last 168 hours. Coagulation Profile: Recent Labs  Lab 08/12/22 1745  INR 1.0   Cardiac Enzymes: No results for input(s): "CKTOTAL", "CKMB", "CKMBINDEX", "TROPONINI" in the last 168 hours. BNP (last 3 results) No results for input(s): "PROBNP" in the last 8760 hours. HbA1C: Recent Labs    08/13/22 0440  HGBA1C 6.6*   CBG: Recent Labs  Lab 08/14/22 1143 08/14/22 1615 08/14/22 2109 08/15/22 0606 08/15/22 1124  GLUCAP 203* 144* 149* 139* 193*   Lipid Profile: Recent Labs    08/13/22 0442  CHOL 105  HDL 31*  LDLCALC 53  TRIG 161  CHOLHDL 3.4   Thyroid Function Tests: No results for input(s): "TSH", "T4TOTAL", "FREET4", "T3FREE", "THYROIDAB" in the last 72 hours. Anemia Panel: Recent  Labs    08/13/22 0440 08/13/22 0442  VITAMINB12 539  --   FOLATE  --  30.3   Sepsis Labs: No results for input(s): "PROCALCITON", "LATICACIDVEN" in the last 168 hours.  No results found for this or any previous visit (from the past 240 hour(s)).       Radiology Studies: No results found.         LOS: 0 days   Time spent= 35 mins     Joline Maxcy, MD [facility]  If 7PM-7AM, please contact night-coverage  08/15/2022, 1:10 PM

## 2022-08-15 NOTE — Plan of Care (Signed)
  Problem: Education: Goal: Ability to describe self-care measures that may prevent or decrease complications (Diabetes Survival Skills Education) will improve Outcome: Progressing   Problem: Coping: Goal: Ability to adjust to condition or change in health will improve Outcome: Progressing   Problem: Fluid Volume: Goal: Ability to maintain a balanced intake and output will improve Outcome: Progressing   Problem: Health Behavior/Discharge Planning: Goal: Ability to identify and utilize available resources and services will improve Outcome: Progressing Goal: Ability to manage health-related needs will improve Outcome: Progressing   Problem: Metabolic: Goal: Ability to maintain appropriate glucose levels will improve Outcome: Progressing   Problem: Nutritional: Goal: Maintenance of adequate nutrition will improve Outcome: Progressing Goal: Progress toward achieving an optimal weight will improve Outcome: Progressing   Problem: Skin Integrity: Goal: Risk for impaired skin integrity will decrease Outcome: Progressing   Problem: Tissue Perfusion: Goal: Adequacy of tissue perfusion will improve Outcome: Progressing   Problem: Education: Goal: Knowledge of General Education information will improve Description: Including pain rating scale, medication(s)/side effects and non-pharmacologic comfort measures Outcome: Progressing   Problem: Health Behavior/Discharge Planning: Goal: Ability to manage health-related needs will improve Outcome: Progressing   Problem: Clinical Measurements: Goal: Ability to maintain clinical measurements within normal limits will improve Outcome: Progressing Goal: Will remain free from infection Outcome: Progressing Goal: Diagnostic test results will improve Outcome: Progressing Goal: Respiratory complications will improve Outcome: Progressing Goal: Cardiovascular complication will be avoided Outcome: Progressing   Problem: Activity: Goal:  Risk for activity intolerance will decrease Outcome: Progressing   Problem: Nutrition: Goal: Adequate nutrition will be maintained Outcome: Progressing   Problem: Coping: Goal: Level of anxiety will decrease Outcome: Progressing   Problem: Elimination: Goal: Will not experience complications related to bowel motility Outcome: Progressing Goal: Will not experience complications related to urinary retention Outcome: Progressing   Problem: Safety: Goal: Ability to remain free from injury will improve Outcome: Progressing   Problem: Skin Integrity: Goal: Risk for impaired skin integrity will decrease Outcome: Progressing

## 2022-08-16 DIAGNOSIS — G9341 Metabolic encephalopathy: Secondary | ICD-10-CM | POA: Diagnosis not present

## 2022-08-16 LAB — GLUCOSE, CAPILLARY
Glucose-Capillary: 111 mg/dL — ABNORMAL HIGH (ref 70–99)
Glucose-Capillary: 175 mg/dL — ABNORMAL HIGH (ref 70–99)
Glucose-Capillary: 184 mg/dL — ABNORMAL HIGH (ref 70–99)
Glucose-Capillary: 137 mg/dL — ABNORMAL HIGH (ref 70–99)

## 2022-08-16 LAB — BASIC METABOLIC PANEL WITH GFR
Anion gap: 11 (ref 5–15)
BUN: 17 mg/dL (ref 8–23)
CO2: 21 mmol/L — ABNORMAL LOW (ref 22–32)
Calcium: 9.2 mg/dL (ref 8.9–10.3)
Chloride: 101 mmol/L (ref 98–111)
Creatinine, Ser: 0.81 mg/dL (ref 0.61–1.24)
GFR, Estimated: >60 mL/min (ref >60)
Glucose, Bld: 140 mg/dL — ABNORMAL HIGH (ref 70–99)
Potassium: 4.1 mmol/L (ref 3.5–5.1)
Sodium: 133 mmol/L — ABNORMAL LOW (ref 135–145)

## 2022-08-16 LAB — MAGNESIUM: Magnesium: 1.8 mg/dL (ref 1.7–2.4)

## 2022-08-16 NOTE — Progress Notes (Signed)
Left upper arm infiltration noticed from previous IV suspected with calcium gluconate infusion previously administered, writer attempted to call pharmacy at this time but no answer will try later.

## 2022-08-16 NOTE — Progress Notes (Signed)
PROGRESS NOTE    Steve Ramirez  [id number] DOB: 1935/12/03 DOA: 08/12/2022 PCP: Assunta Found, MD   Brief Narrative:  87 year old with history of CAD, HTN, diabetes, depression, TIA comes to the ER with acute onset of aphasia around 4:45 PM on the day of admission.  He lives at an assisted living.  MRI was negative for acute CVA, CT head and CTA head and neck were also negative for LVO but did show prior old CVA and 70% right-sided internal carotid stenosis and 50% right vertebral artery origin stenosis.  Neurology recommended EEG which showed left temporal slowing.  Neurology.  We started patient on 500 mg of Keppra twice daily.  Awaiting SNF placement.   Assessment & Plan:  Principal Problem:   Acute metabolic encephalopathy Active Problems:   Essential hypertension, benign   Diabetes mellitus without complication (HCC)   Hyperlipidemia   History of TIAs   Coronary atherosclerosis of native coronary artery   Major depressive disorder, recurrent severe without psychotic features (HCC)   DNR (do not resuscitate)/DNI(Do Not Intubate)   Acute metabolic encephalopathy Acute onset of aphasia MRI brain, CT head and CTA head and neck are negative for acute pathology but did show prior old CVA and 70% right-sided internal carotid stenosis and 50% right vertebral artery origin stenosis.  Neurology recommended EEG which showed left temporal slowing Continue aspirin, Plavix, statin A1c 6.6, LDL 53 Echocardiogram = preserved EF  Major depressive disorder, recurrent severe without psychotic features (HCC) Stable. Continue lexapro   Coronary atherosclerosis of native coronary artery Currently chest pain-free Continue aspirin, Plavix, Imdur, lisinopril, Toprol-XL   History of TIAs Aspirin Plavix and statin   Hyperlipidemia On pravastatin 40 mg daily.   Diabetes mellitus without complication (HCC) Hold metformin. sliding scale   Essential hypertension, benign Stable. On  catapres patch, aldactone, norvace, imdur, zestril, toprol-xl   DNR (do not resuscitate)/DNI(Do Not Intubate)  DVT prophylaxis: Subcu heparin Code Status: DNR Family Communication: Periodically updated son Pending placement.       Diet Orders (From admission, onward)     Start     Ordered   08/12/22 2314  Diet heart healthy/carb modified Room service appropriate? Yes; Fluid consistency: Thin  Diet effective now       Question Answer Comment  Diet-HS Snack? Nothing   Room service appropriate? Yes   Fluid consistency: Thin      08/12/22 2313            Subjective: No complaints.   Examination:  Constitutional: Not in acute distress Respiratory: Clear to auscultation bilaterally Cardiovascular: Normal sinus rhythm, no rubs Abdomen: Nontender nondistended good bowel sounds Musculoskeletal: No edema noted Skin: No rashes seen Neurologic: CN 2-12 grossly intact.  And nonfocal Psychiatric: Poor judgment and insight. Alert and oriented x 3. Normal mood.     Objective: Vitals:   08/15/22 1944 08/15/22 2329 08/16/22 0317 08/16/22 0834  BP: (!) 131/98 (!) 93/48 96/61 133/61  Pulse: 72 (!) 57 61 (!) 54  Resp: 18 18 19    Temp: 98 F (36.7 C) 97.9 F (36.6 C) 97.7 F (36.5 C) 98.7 F (37.1 C)  TempSrc: Oral Oral Oral Oral  SpO2: 95% 96% 98% 97%  Weight:      Height:        Intake/Output Summary (Last 24 hours) at 08/16/2022 1020 Last data filed at 08/16/2022 0500 Gross per 24 hour  Intake --  Output 2200 ml  Net -2200 ml   [employer]   08/13/22  0500 08/14/22 0457 08/15/22 0500  Weight: 82.1 kg 82.1 kg 82.1 kg    Scheduled Meds:  amLODipine  10 mg Oral Daily   aspirin EC  81 mg Oral Daily   cloNIDine  0.3 mg Transdermal Weekly   clopidogrel  75 mg Oral Daily   escitalopram  20 mg Oral Daily   heparin  5,000 Units Subcutaneous Q8H   insulin aspart  0-5 Units Subcutaneous QHS   insulin aspart  0-9 Units Subcutaneous TID WC   isosorbide mononitrate  30  mg Oral QPM   levETIRAcetam  500 mg Oral BID   levothyroxine  50 mcg Oral Daily   lisinopril  40 mg Oral Daily   melatonin  3 mg Oral QHS   metoprolol succinate  25 mg Oral QPM   pravastatin  40 mg Oral Daily   risperiDONE  0.5 mg Oral [id number]   Continuous Infusions:  Nutritional status     Body mass index is 27.52 kg/m.  Data Reviewed:   CBC: Recent Labs  Lab 08/12/22 1745 08/12/22 1752  WBC 5.2  --   NEUTROABS 3.1  --   HGB 11.6* 11.9*  HCT 36.2* 35.0*  MCV 85.4  --   PLT 257  --    Basic Metabolic Panel: Recent Labs  Lab 08/12/22 1745 08/12/22 1752 08/13/22 0440 08/14/22 0354 08/15/22 0208 08/16/22 0228  NA 136 137 137 138 135 133*  K 3.8 4.1 3.8 4.6 4.2 4.1  CL 106 104 104 103 100 101  CO2 17*  --  20* 27 23 21*  GLUCOSE 194* 198* 103* 127* 123* 140*  BUN 14 17 11 13 20 17   CREATININE 0.93 0.80 0.74 0.77 0.94 0.81  CALCIUM 8.5*  --  9.3 9.7 9.4 9.2  MG  --   --   --  1.9 1.8 1.8   GFR: Estimated Creatinine Clearance: 71 mL/min (by C-G formula based on SCr of 0.81 mg/dL). Liver Function Tests: Recent Labs  Lab 08/12/22 1745  AST 16  ALT 15  ALKPHOS 52  BILITOT 0.3  PROT 5.7*  ALBUMIN 3.5   No results for input(s): "LIPASE", "AMYLASE" in the last 168 hours. No results for input(s): "AMMONIA" in the last 168 hours. Coagulation Profile: Recent Labs  Lab 08/12/22 1745  INR 1.0   Cardiac Enzymes: No results for input(s): "CKTOTAL", "CKMB", "CKMBINDEX", "TROPONINI" in the last 168 hours. BNP (last 3 results) No results for input(s): "PROBNP" in the last 8760 hours. HbA1C: No results for input(s): "HGBA1C" in the last 72 hours.  CBG: Recent Labs  Lab 08/15/22 0606 08/15/22 1124 08/15/22 1519 08/15/22 2112 08/16/22 0608  GLUCAP 139* 193* 156* 145* 111*   Lipid Profile: No results for input(s): "CHOL", "HDL", "LDLCALC", "TRIG", "CHOLHDL", "LDLDIRECT" in the last 72 hours.  Thyroid Function Tests: No results for input(s): "TSH",  "T4TOTAL", "FREET4", "T3FREE", "THYROIDAB" in the last 72 hours. Anemia Panel: No results for input(s): "VITAMINB12", "FOLATE", "FERRITIN", "TIBC", "IRON", "RETICCTPCT" in the last 72 hours.  Sepsis Labs: No results for input(s): "PROCALCITON", "LATICACIDVEN" in the last 168 hours.  No results found for this or any previous visit (from the past 240 hour(s)).       Radiology Studies: No results found.         LOS: 0 days   Time spent= 35 mins     Joline Maxcy, MD [facility]  If 7PM-7AM, please contact night-coverage  08/16/2022, 10:20 AM

## 2022-08-16 NOTE — Plan of Care (Signed)
  Problem: Education: Goal: Ability to describe self-care measures that may prevent or decrease complications (Diabetes Survival Skills Education) will improve Outcome: Progressing   Problem: Coping: Goal: Ability to adjust to condition or change in health will improve Outcome: Progressing   Problem: Fluid Volume: Goal: Ability to maintain a balanced intake and output will improve Outcome: Progressing   Problem: Health Behavior/Discharge Planning: Goal: Ability to identify and utilize available resources and services will improve Outcome: Progressing Goal: Ability to manage health-related needs will improve Outcome: Progressing   Problem: Metabolic: Goal: Ability to maintain appropriate glucose levels will improve Outcome: Progressing   Problem: Nutritional: Goal: Maintenance of adequate nutrition will improve Outcome: Progressing Goal: Progress toward achieving an optimal weight will improve Outcome: Progressing   Problem: Skin Integrity: Goal: Risk for impaired skin integrity will decrease Outcome: Progressing   Problem: Tissue Perfusion: Goal: Adequacy of tissue perfusion will improve Outcome: Progressing   Problem: Education: Goal: Knowledge of General Education information will improve Description: Including pain rating scale, medication(s)/side effects and non-pharmacologic comfort measures Outcome: Progressing   Problem: Health Behavior/Discharge Planning: Goal: Ability to manage health-related needs will improve Outcome: Progressing   Problem: Clinical Measurements: Goal: Ability to maintain clinical measurements within normal limits will improve Outcome: Progressing Goal: Will remain free from infection Outcome: Progressing Goal: Diagnostic test results will improve Outcome: Progressing Goal: Respiratory complications will improve Outcome: Progressing Goal: Cardiovascular complication will be avoided Outcome: Progressing   Problem: Activity: Goal:  Risk for activity intolerance will decrease Outcome: Progressing   Problem: Nutrition: Goal: Adequate nutrition will be maintained Outcome: Progressing   Problem: Coping: Goal: Level of anxiety will decrease Outcome: Progressing   Problem: Elimination: Goal: Will not experience complications related to bowel motility Outcome: Progressing Goal: Will not experience complications related to urinary retention Outcome: Progressing   Problem: Safety: Goal: Ability to remain free from injury will improve Outcome: Progressing   Problem: Skin Integrity: Goal: Risk for impaired skin integrity will decrease Outcome: Progressing

## 2022-08-16 NOTE — Plan of Care (Signed)
  Problem: Fluid Volume: Goal: Ability to maintain a balanced intake and output will improve Outcome: Progressing   Problem: Nutritional: Goal: Maintenance of adequate nutrition will improve Outcome: Progressing Goal: Progress toward achieving an optimal weight will improve Outcome: Progressing   Problem: Skin Integrity: Goal: Risk for impaired skin integrity will decrease Outcome: Progressing   Problem: Tissue Perfusion: Goal: Adequacy of tissue perfusion will improve Outcome: Progressing   

## 2022-08-17 DIAGNOSIS — G9341 Metabolic encephalopathy: Secondary | ICD-10-CM | POA: Diagnosis not present

## 2022-08-17 LAB — GLUCOSE, CAPILLARY
Glucose-Capillary: 113 mg/dL — ABNORMAL HIGH (ref 70–99)
Glucose-Capillary: 123 mg/dL — ABNORMAL HIGH (ref 70–99)
Glucose-Capillary: 189 mg/dL — ABNORMAL HIGH (ref 70–99)

## 2022-08-17 NOTE — Plan of Care (Signed)
progressing 

## 2022-08-17 NOTE — Progress Notes (Signed)
PROGRESS NOTE    Steve Ramirez  ZOX:[id number] DOB: [birth year] DOA: 08/12/2022 PCP: Assunta Found, MD   Brief Narrative:  87 year old with history of CAD, HTN, diabetes, depression, TIA comes to the ER with acute onset of aphasia around 4:45 PM on the day of admission.  He lives at an assisted living.  MRI was negative for acute CVA, CT head and CTA head and neck were also negative for LVO but did show prior old CVA and 70% right-sided internal carotid stenosis and 50% right vertebral artery origin stenosis.  Neurology recommended EEG which showed left temporal slowing.  Neurology.  We started patient on 500 mg of Keppra twice daily.  Awaiting SNF placement.   Assessment & Plan:  Principal Problem:   Acute metabolic encephalopathy Active Problems:   Essential hypertension, benign   Diabetes mellitus without complication (HCC)   Hyperlipidemia   History of TIAs   Coronary atherosclerosis of native coronary artery   Major depressive disorder, recurrent severe without psychotic features (HCC)   DNR (do not resuscitate)/DNI(Do Not Intubate)   Acute metabolic encephalopathy Acute onset of aphasia MRI brain, CT head and CTA head and neck are negative for acute pathology but did show prior old CVA and 70% right-sided internal carotid stenosis and 50% right vertebral artery origin stenosis.  Neurology recommended EEG which showed left temporal slowing Continue aspirin, Plavix, statin A1c 6.6, LDL 53 Echocardiogram = preserved EF  Major depressive disorder, recurrent severe without psychotic features (HCC) Stable. Continue lexapro   Coronary atherosclerosis of native coronary artery Currently chest pain-free Continue aspirin, Plavix, Imdur, lisinopril, Toprol-XL   History of TIAs Aspirin Plavix and statin   Hyperlipidemia On pravastatin 40 mg daily.   Diabetes mellitus without complication (HCC) Hold metformin. sliding scale   Essential hypertension, benign Stable. On  catapres patch, aldactone, norvace, imdur, zestril, toprol-xl   DNR (do not resuscitate)/DNI(Do Not Intubate)  DVT prophylaxis: Subcu heparin Code Status: DNR Family Communication: Periodically updated son Pending placement.       Diet Orders (From admission, onward)     Start     Ordered   08/12/22 2314  Diet heart healthy/carb modified Room service appropriate? Yes; Fluid consistency: Thin  Diet effective now       Question Answer Comment  Diet-HS Snack? Nothing   Room service appropriate? Yes   Fluid consistency: Thin      08/12/22 2313            Subjective: No acute events overnight.  No complaints  Examination: Constitutional: Not in acute distress Respiratory: Clear to auscultation bilaterally Cardiovascular: Normal sinus rhythm, no rubs Abdomen: Nontender nondistended good bowel sounds Musculoskeletal: No edema noted Skin: No rashes seen Neurologic: CN 2-12 grossly intact.  And nonfocal Psychiatric: Normal judgment and insight. Alert and oriented x 3. Normal mood.   Objective: Vitals:   08/16/22 1939 08/16/22 2319 08/17/22 0331 08/17/22 0736  BP: 119/61 111/62 (!) 95/56 (!) 146/64  Pulse: 74 67 63 60  Resp: 18 18 18 18   Temp: 98.2 F (36.8 C) 98 F (36.7 C) (!) 97.5 F (36.4 C) 98.5 F (36.9 C)  TempSrc: Oral Oral Oral Oral  SpO2: 93% 93% 94% 97%  Weight:      Height:        Intake/Output Summary (Last 24 hours) at 08/17/2022 1041 Last data filed at 08/17/2022 0635 Gross per 24 hour  Intake --  Output 1100 ml  Net -1100 ml   [employer]   08/13/22  0500 08/14/22 0457 08/15/22 0500  Weight: 82.1 kg 82.1 kg 82.1 kg    Scheduled Meds:  amLODipine  10 mg Oral Daily   aspirin EC  81 mg Oral Daily   cloNIDine  0.3 mg Transdermal Weekly   clopidogrel  75 mg Oral Daily   escitalopram  20 mg Oral Daily   heparin  5,000 Units Subcutaneous Q8H   insulin aspart  0-5 Units Subcutaneous QHS   insulin aspart  0-9 Units Subcutaneous TID WC    isosorbide mononitrate  30 mg Oral QPM   levETIRAcetam  500 mg Oral BID   levothyroxine  50 mcg Oral Daily   lisinopril  40 mg Oral Daily   melatonin  3 mg Oral QHS   metoprolol succinate  25 mg Oral QPM   pravastatin  40 mg Oral Daily   risperiDONE  0.5 mg Oral [id number]   Continuous Infusions:  Nutritional status     Body mass index is 27.52 kg/m.  Data Reviewed:   CBC: Recent Labs  Lab 08/12/22 1745 08/12/22 1752  WBC 5.2  --   NEUTROABS 3.1  --   HGB 11.6* 11.9*  HCT 36.2* 35.0*  MCV 85.4  --   PLT 257  --    Basic Metabolic Panel: Recent Labs  Lab 08/13/22 0440 08/14/22 0354 08/15/22 0208 08/16/22 0228 08/17/22 0426  NA 137 138 135 133* 135  K 3.8 4.6 4.2 4.1 4.0  CL 104 103 100 101 106  CO2 20* 27 23 21* 20*  GLUCOSE 103* 127* 123* 140* 128*  BUN 11 13 20 17  24*  CREATININE 0.74 0.77 0.94 0.81 0.90  CALCIUM 9.3 9.7 9.4 9.2 9.4  MG  --  1.9 1.8 1.8 1.9   GFR: Estimated Creatinine Clearance: 63.9 mL/min (by C-G formula based on SCr of 0.9 mg/dL). Liver Function Tests: Recent Labs  Lab 08/12/22 1745  AST 16  ALT 15  ALKPHOS 52  BILITOT 0.3  PROT 5.7*  ALBUMIN 3.5   No results for input(s): "LIPASE", "AMYLASE" in the last 168 hours. No results for input(s): "AMMONIA" in the last 168 hours. Coagulation Profile: Recent Labs  Lab 08/12/22 1745  INR 1.0   Cardiac Enzymes: No results for input(s): "CKTOTAL", "CKMB", "CKMBINDEX", "TROPONINI" in the last 168 hours. BNP (last 3 results) No results for input(s): "PROBNP" in the last 8760 hours. HbA1C: No results for input(s): "HGBA1C" in the last 72 hours.  CBG: Recent Labs  Lab 08/16/22 1120 08/16/22 1629 08/16/22 2102 08/17/22 0604 08/17/22 0633  GLUCAP 175* 184* 137* 113* 123*   Lipid Profile: No results for input(s): "CHOL", "HDL", "LDLCALC", "TRIG", "CHOLHDL", "LDLDIRECT" in the last 72 hours.  Thyroid Function Tests: No results for input(s): "TSH", "T4TOTAL", "FREET4", "T3FREE",  "THYROIDAB" in the last 72 hours. Anemia Panel: No results for input(s): "VITAMINB12", "FOLATE", "FERRITIN", "TIBC", "IRON", "RETICCTPCT" in the last 72 hours.  Sepsis Labs: No results for input(s): "PROCALCITON", "LATICACIDVEN" in the last 168 hours.  No results found for this or any previous visit (from the past 240 hour(s)).       Radiology Studies: No results found.         LOS: 0 days   Time spent= 35 mins     Joline Maxcy, MD [facility]  If 7PM-7AM, please contact night-coverage  08/17/2022, 10:41 AM

## 2022-08-17 NOTE — Discharge Summary (Signed)
Physician Discharge Summary  Steve Ramirez:[id number] DOB: 29-Oct-1935 DOA: 08/12/2022  PCP: Assunta Found, MD  Admit date: 08/12/2022 Discharge date: 08/17/2022  Admitted From: Home Disposition:  SNF  Recommendations for Outpatient Follow-up:  Follow up with PCP in 1-2 weeks Please obtain BMP/CBC in one week your next doctors visit.  Started Keppra 500mg  po bid.  Cont home meds including ASA, Plavix and statin. Follow up outpatient Neurology in 3-4 weeks.  Stopped Aldactone for now, resume when able.    Discharge Condition: Stable CODE STATUS: DNR Diet recommendation: Cardiac  Brief/Interim Summary:  87 year old with history of CAD, HTN, diabetes, depression, TIA comes to the ER with acute onset of aphasia around 4:45 PM on the day of admission.  He lives at an assisted living.  MRI was negative for acute CVA, CT head and CTA head and neck were also negative for LVO but did show prior old CVA and 70% right-sided internal carotid stenosis and 50% right vertebral artery origin stenosis.  Neurology recommended EEG which showed left temporal slowing.  Neurology.  We started patient on 500 mg of Keppra twice daily.   PT recs = SNF     Assessment & Plan:  Principal Problem:   Acute metabolic encephalopathy Active Problems:   Essential hypertension, benign   Diabetes mellitus without complication (HCC)   Hyperlipidemia   History of TIAs   Coronary atherosclerosis of native coronary artery   Major depressive disorder, recurrent severe without psychotic features (HCC)   DNR (do not resuscitate)/DNI(Do Not Intubate)   Acute metabolic encephalopathy Acute onset of aphasia MRI brain, CT head and CTA head and neck are negative for acute pathology but did show prior old CVA and 70% right-sided internal carotid stenosis and 50% right vertebral artery origin stenosis.  Neurology recommended EEG which showed left temporal slowing Started Keppra BID Continue aspirin, Plavix,  statin A1c 6.6, LDL 53 Echocardiogram = preserved EF   Major depressive disorder, recurrent severe without psychotic features (HCC) Stable. Continue lexapro   Coronary atherosclerosis of native coronary artery Currently chest pain-free Continue aspirin, Plavix, Imdur, lisinopril, Toprol-XL   History of TIAs Aspirin Plavix and statin   Hyperlipidemia On pravastatin 40 mg daily.   Diabetes mellitus without complication (HCC) Resume home meds   Essential hypertension, benign Stable. On catapres patch, aldactone, norvace, imdur, zestril, toprol-xl   DNR (do not resuscitate)/DNI(Do Not Intubate)     Body mass index is 27.52 kg/m.       Discharge Diagnoses:  Principal Problem:   Acute metabolic encephalopathy Active Problems:   Essential hypertension, benign   Diabetes mellitus without complication (HCC)   Hyperlipidemia   History of TIAs   Coronary atherosclerosis of native coronary artery   Major depressive disorder, recurrent severe without psychotic features (HCC)   DNR (do not resuscitate)/DNI(Do Not Intubate)      Consultations: Neurology  Subjective: No complaints.   Discharge Exam: Vitals:   08/17/22 0736 08/17/22 1147  BP: (!) 146/64 (!) 117/58  Pulse: 60 64  Resp: 18   Temp: 98.5 F (36.9 C) 97.7 F (36.5 C)  SpO2: 97% 96%   Vitals:   08/16/22 2319 08/17/22 0331 08/17/22 0736 08/17/22 1147  BP: 111/62 (!) 95/56 (!) 146/64 (!) 117/58  Pulse: 67 63 60 64  Resp: 18 18 18    Temp: 98 F (36.7 C) (!) 97.5 F (36.4 C) 98.5 F (36.9 C) 97.7 F (36.5 C)  TempSrc: Oral Oral Oral Oral  SpO2: 93% 94% 97% 96%  Weight:      Height:        General: Pt is alert, awake, not in acute distress Cardiovascular: RRR, S1/S2 +, no rubs, no gallops Respiratory: CTA bilaterally, no wheezing, no rhonchi Abdominal: Soft, NT, ND, bowel sounds + Extremities: no edema, no cyanosis  Discharge Instructions   Allergies as of 08/17/2022   No Known  Allergies      Medication List     STOP taking these medications    spironolactone 25 MG tablet Commonly known as: ALDACTONE       TAKE these medications    amLODipine 10 MG tablet Commonly known as: NORVASC Take 1 tablet (10 mg total) by mouth daily.   aspirin EC 81 MG tablet Take 1 tablet (81 mg total) by mouth 2 (two) times daily. For DVT prophylaxis for 30 days after surgery.   cetirizine 10 MG tablet Commonly known as: ZYRTEC Take 10 mg by mouth daily as needed for allergies.   cloNIDine 0.3 mg/24hr patch Commonly known as: CATAPRES - Dosed in mg/24 hr Place 1 patch (0.3 mg total) onto the skin once a week. What changed:  when to take this additional instructions   clopidogrel 75 MG tablet Commonly known as: PLAVIX Take 1 tablet (75 mg total) by mouth daily.   cyanocobalamin 1000 MCG tablet Commonly known as: VITAMIN B12 Take 1 tablet (1,000 mcg total) by mouth daily.   escitalopram 20 MG tablet Commonly known as: LEXAPRO Take 1 tablet (20 mg total) by mouth daily.   ferrous sulfate 325 (65 FE) MG tablet Take 1 tablet (325 mg total) by mouth daily with breakfast. What changed: when to take this   Fish Oil 1000 MG Caps Take 1,000 mg by mouth daily.   isosorbide mononitrate 30 MG 24 hr tablet Commonly known as: IMDUR Take 1 tablet (30 mg total) by mouth every evening. What changed: when to take this   lamoTRIgine 25 MG tablet Commonly known as: LAMICTAL Take 25 mg by mouth in the morning.   levETIRAcetam 500 MG tablet Commonly known as: KEPPRA Take 1 tablet (500 mg total) by mouth 2 (two) times daily.   levothyroxine 50 MCG tablet Commonly known as: SYNTHROID Take 1 tablet (50 mcg total) by mouth daily.   lisinopril 40 MG tablet Commonly known as: ZESTRIL Take 1 tablet (40 mg total) by mouth daily.   loratadine 10 MG tablet Commonly known as: CLARITIN Take 10 mg by mouth daily as needed for allergies.   melatonin 3 MG Tabs tablet Take  3 mg by mouth at bedtime.   metFORMIN 1000 MG tablet Commonly known as: GLUCOPHAGE Take 1 tablet (1,000 mg total) by mouth 2 (two) times daily with a meal.   metoprolol succinate 25 MG 24 hr tablet Commonly known as: TOPROL-XL Take 25 mg by mouth daily at 6 PM.   multivitamin with minerals Tabs tablet Take 1 tablet by mouth daily.   nitroGLYCERIN 0.4 MG SL tablet Commonly known as: NITROSTAT Place 1 tablet (0.4 mg total) under the tongue every 5 (five) minutes as needed for chest pain.   pravastatin 40 MG tablet Commonly known as: PRAVACHOL Take 40 mg by mouth at bedtime.   risperiDONE 0.5 MG tablet Commonly known as: RISPERDAL Take 1 tablet (0.5 mg total) by mouth 2 (two) times daily at 8 am and 4 pm. What changed: when to take this   rOPINIRole 1 MG tablet Commonly known as: REQUIP Take 1 mg by mouth at bedtime.  Contact information for after-discharge care     Destination     [facility] .   Service: Skilled Nursing Contact information: [address] [place] [address] [phone]                    No Known Allergies  You were cared for by a hospitalist during your hospital stay. If you have any questions about your discharge medications or the care you received while you were in the hospital after you are discharged, you can call the unit and asked to speak with the hospitalist on call if the hospitalist that took care of you is not available. Once you are discharged, your primary care physician will handle any further medical issues. Please note that no refills for any discharge medications will be authorized once you are discharged, as it is imperative that you return to your primary care physician (or establish a relationship with a primary care physician if you do not have one) for your aftercare needs so that they can reassess your need for medications and monitor your lab values.  You were cared for by a  hospitalist during your hospital stay. If you have any questions about your discharge medications or the care you received while you were in the hospital after you are discharged, you can call the unit and asked to speak with the hospitalist on call if the hospitalist that took care of you is not available. Once you are discharged, your primary care physician will handle any further medical issues. Please note that NO REFILLS for any discharge medications will be authorized once you are discharged, as it is imperative that you return to your primary care physician (or establish a relationship with a primary care physician if you do not have one) for your aftercare needs so that they can reassess your need for medications and monitor your lab values.  Please request your Prim.MD to go over all Hospital Tests and Procedure/Radiological results at the follow up, please get all Hospital records sent to your Prim MD by signing hospital release before you go home.  Get CBC, CMP, 2 view Chest X ray checked  by Primary MD during your next visit or SNF MD in 5-7 days ( we routinely change or add medications that can affect your baseline labs and fluid status, therefore we recommend that you get the mentioned basic workup next visit with your PCP, your PCP may decide not to get them or add new tests based on their clinical decision)  On your next visit with your primary care physician please Get Medicines reviewed and adjusted.  If you experience worsening of your admission symptoms, develop shortness of breath, life threatening emergency, suicidal or homicidal thoughts you must seek medical attention immediately by calling 911 or calling your MD immediately  if symptoms less severe.  You Must read complete instructions/literature along with all the possible adverse reactions/side effects for all the Medicines you take and that have been prescribed to you. Take any new Medicines after you have completely understood  and accpet all the possible adverse reactions/side effects.   Do not drive, operate heavy machinery, perform activities at heights, swimming or participation in water activities or provide baby sitting services if your were admitted for syncope or siezures until you have seen by Primary MD or a Neurologist and advised to do so again.  Do not drive when taking Pain medications.   Procedures/Studies: ECHOCARDIOGRAM COMPLETE  Result Date: 08/13/2022  ECHOCARDIOGRAM REPORT   Patient Name:   Steve Ramirez Date of Exam: 08/13/2022 Medical Rec #:  [id number]           Height:       68.0 in Accession #:    [id number]          Weight:       181.1 lb Date of Birth:  12-28-1935          BSA:          1.959 m Patient Age:    86 years            BP:           127/66 mmHg Patient Gender: M                   HR:           61 bpm. Exam Location:  Inpatient Procedure: 2D Echo, Cardiac Doppler and Color Doppler Indications:    TIA G45.9  History:        Patient has prior history of Echocardiogram examinations, most                 recent 10/25/2020. CAD, TIA and Stroke; Risk                 Factors:Hypertension, Diabetes and Dyslipidemia.  Sonographer:    Lucendia Herrlich Referring Phys: [id number]  CHIRAG  IMPRESSIONS  1. Left ventricular ejection fraction, by estimation, is 55 to 60%. The left ventricle has normal function. The left ventricle has no regional wall motion abnormalities. There is mild concentric left ventricular hypertrophy. Left ventricular diastolic parameters are indeterminate.  2. Right ventricular systolic function is normal. The right ventricular size is normal. There is normal pulmonary artery systolic pressure.  3. Left atrial size was mild to moderately dilated.  4. The mitral valve is normal in structure. Trivial mitral valve regurgitation. No evidence of mitral stenosis.  5. The aortic valve is tricuspid. There is mild calcification of the aortic valve. There is mild thickening of the  aortic valve. Aortic valve regurgitation is trivial. Aortic valve sclerosis/calcification is present, without any evidence of aortic stenosis.  6. The inferior vena cava is normal in size with greater than 50% respiratory variability, suggesting right atrial pressure of 3 mmHg. Comparison(s): No significant change from prior study. Conclusion(s)/Recommendation(s): Otherwise normal echocardiogram, with minor abnormalities described in the report. FINDINGS  Left Ventricle: Left ventricular ejection fraction, by estimation, is 55 to 60%. The left ventricle has normal function. The left ventricle has no regional wall motion abnormalities. The left ventricular internal cavity size was normal in size. There is  mild concentric left ventricular hypertrophy. Left ventricular diastolic parameters are indeterminate. Right Ventricle: The right ventricular size is normal. Right vetricular wall thickness was not well visualized. Right ventricular systolic function is normal. There is normal pulmonary artery systolic pressure. The tricuspid regurgitant velocity is 1.68 m/s, and with an assumed right atrial pressure of 3 mmHg, the estimated right ventricular systolic pressure is 14.3 mmHg. Left Atrium: Left atrial size was mild to moderately dilated. Right Atrium: Right atrial size was normal in size. Pericardium: There is no evidence of pericardial effusion. Mitral Valve: The mitral valve is normal in structure. Trivial mitral valve regurgitation. No evidence of mitral valve stenosis. Tricuspid Valve: The tricuspid valve is normal in structure. Tricuspid valve regurgitation is trivial. No evidence of tricuspid stenosis. Aortic Valve: The aortic valve is tricuspid. There is mild calcification  of the aortic valve. There is mild thickening of the aortic valve. Aortic valve regurgitation is trivial. Aortic valve sclerosis/calcification is present, without any evidence of aortic stenosis. Aortic valve peak gradient measures 6.6 mmHg.  Pulmonic Valve: The pulmonic valve was grossly normal. Pulmonic valve regurgitation is trivial. No evidence of pulmonic stenosis. Aorta: The aortic root, ascending aorta and aortic arch are all structurally normal, with no evidence of dilitation or obstruction. Venous: The inferior vena cava is normal in size with greater than 50% respiratory variability, suggesting right atrial pressure of 3 mmHg. IAS/Shunts: The atrial septum is grossly normal.  LEFT VENTRICLE PLAX 2D LVIDd:         4.40 cm   Diastology LVIDs:         2.80 cm   LV e' medial:    5.75 cm/s LV PW:         1.30 cm   LV E/e' medial:  10.2 LV IVS:        1.10 cm   LV e' lateral:   8.55 cm/s LVOT diam:     2.30 cm   LV E/e' lateral: 6.9 LV SV:         72 LV SV Index:   37 LVOT Area:     4.15 cm  RIGHT VENTRICLE             IVC RV S prime:     11.80 cm/s  IVC diam: 1.50 cm TAPSE (M-mode): 1.8 cm LEFT ATRIUM             Index        RIGHT ATRIUM           Index LA diam:        3.90 cm 1.99 cm/m   RA Area:     12.00 cm LA Vol (A2C):   55.0 ml 28.07 ml/m  RA Volume:   21.50 ml  10.97 ml/m LA Vol (A4C):   59.6 ml 30.42 ml/m LA Biplane Vol: 61.8 ml 31.54 ml/m  AORTIC VALVE AV Area (Vmax): 2.40 cm AV Vmax:        128.00 cm/s AV Peak Grad:   6.6 mmHg LVOT Vmax:      74.07 cm/s LVOT Vmean:     48.200 cm/s LVOT VTI:       0.173 m  AORTA Ao Root diam: 3.50 cm Ao Asc diam:  3.60 cm MITRAL VALVE               TRICUSPID VALVE MV Area (PHT): 2.80 cm    TR Peak grad:   11.3 mmHg MV Decel Time: 271 msec    TR Vmax:        168.00 cm/s MR Peak grad: 26.0 mmHg MR Vmax:      255.00 cm/s  SHUNTS MV E velocity: 58.60 cm/s  Systemic VTI:  0.17 m MV A velocity: 97.10 cm/s  Systemic Diam: 2.30 cm MV E/A ratio:  0.60 Jodelle Red MD Electronically signed by Jodelle Red MD Signature Date/Time: 08/13/2022/3:19:54 PM    Final    EEG adult  Result Date: 08/12/2022 Windell Norfolk, MD     08/12/2022  9:16 PM History: 87 year old man with history of aphasia  and right sided weakness EEG classification: Awake and drowsy Description of the recording: The background rhythms of this recording consists of a fairly well modulated medium amplitude alpha rhythm of 8.5 Hz that is reactive to eye opening and closure. Present in the anterior head  region is a 15-20 Hz beta activity. Photic stimulation was not performed. Hyperventilation was not performed. Drowsiness was manifested by background fragmentation. No abnormal epileptiform discharges seen during this recording. There was left temporal focal slowing. There were no electrographic seizure identified. Abnormality: Left temporal slowing Impression: This is an abnormal EEG recorded while drowsy and awake due to left temporal slowing. This is consistent with an area of neuronal dysfunction in the left temporal region.  Windell Norfolk, MD [facility]   MR BRAIN WO CONTRAST  Result Date: 08/12/2022 CLINICAL DATA:  Acute stroke presentation, left hemisphere. EXAM: MRI HEAD WITHOUT CONTRAST TECHNIQUE: Multiplanar, multiecho pulse sequences of the brain and surrounding structures were obtained without intravenous contrast. COMPARISON:  CT studies same day.  MRI 11/10/2020 FINDINGS: Brain: Diffusion imaging does not show any acute or subacute infarction. Chronic small-vessel ischemic changes are seen throughout the pons. No focal cerebellar insult. Cerebral hemispheres show old infarctions of the thalami and confluent chronic small vessel ischemic changes of the white matter. No mass, hemorrhage, hydrocephalus or extra-axial collection. Vascular: Major vessels at the base of the brain show flow. Skull and upper cervical spine: Negative Sinuses/Orbits: Clear/normal Other: None IMPRESSION: No acute finding by MRI. Extensive chronic small-vessel ischemic changes throughout the brain as outlined above. Electronically Signed   By: Paulina Fusi M.D.   On: 08/12/2022 18:35   CT ANGIO HEAD NECK W WO CM W PERF (CODE  STROKE)  Result Date: 08/12/2022 CLINICAL DATA:  Acute stroke.  Right-sided weakness. EXAM: CT ANGIOGRAPHY HEAD AND NECK CT PERFUSION BRAIN TECHNIQUE: Multidetector CT imaging of the head and neck was performed using the standard protocol during bolus administration of intravenous contrast. Multiplanar CT image reconstructions and MIPs were obtained to evaluate the vascular anatomy. Carotid stenosis measurements (when applicable) are obtained utilizing NASCET criteria, using the distal internal carotid diameter as the denominator. Multiphase CT imaging of the brain was performed following IV bolus contrast injection. Subsequent parametric perfusion maps were calculated using RAPID software. RADIATION DOSE REDUCTION: This exam was performed according to the departmental dose-optimization program which includes automated exposure control, adjustment of the mA and/or kV according to patient size and/or use of iterative reconstruction technique. CONTRAST:  OMNIPAQUE IOHEXOL 350 MG/ML SOLN COMPARISON:  Head CT immediately prior FINDINGS: CTA NECK FINDINGS Aortic arch: Branching pattern is normal. Aortic atherosclerosis. Brachiocephalic vessel origin atherosclerosis but without significant stenosis. Right carotid system: Common carotid artery shows some scattered plaque but is widely patent to the bifurcation. Calcified plaque at the carotid bifurcation and ICA bulb. Minimal diameter at the distal bulb is 1.5 mm. Compared to a more distal cervical ICA diameter of 4.7 mm, This indicates a stenosis of 70%. Left carotid system: Common carotid artery shows some scattered plaque but is widely patent to the bifurcation. Calcified plaque at the carotid bifurcation and ICA bulb. Minimal diameter at the proximal ICA measures 3 mm. Compared to a more distal cervical ICA diameter of 4.5 mm, this indicates a 33% stenosis. Vertebral arteries: Calcified plaque at the right vertebral artery origin with stenosis estimated at 50%.  No stenosis of the dominant left vertebral artery origin. Both vessels are patent beyond that through the cervical region to the foramen magnum. Skeleton: Previous cervical fusion surgeries. Degenerative changes above and below that. Other neck: No mass or lymphadenopathy. Upper chest: Mild upper lobe scarring. Review of the MIP images confirms the above findings CTA HEAD FINDINGS Anterior circulation: Both internal carotid arteries are patent through the skull  base and siphon regions. Ordinary siphon atherosclerotic change with stenosis estimated at 30% on both sides. The anterior and middle cerebral vessels are patent. No large vessel occlusion. Mild stenosis of the left M1 segment. Areas of atherosclerotic narrowing in the more distal branch vessels. Posterior circulation: Both vertebral arteries are patent through the foramen magnum. The small right vertebral artery gives a minor contribution to the basilar artery. The dominant left vertebral artery shows atherosclerotic plaque in the V4 segment but no stenosis greater than 30%. Mild atherosclerotic irregularity of the basilar artery but without significant stenosis. Superior cerebellar and posterior cerebral arteries are patent. Atherosclerotic narrowing and irregularity is seen within both PCA vessels. Venous sinuses: Patent and normal. Anatomic variants: None significant. Review of the MIP images confirms the above findings CT Brain Perfusion Findings: ASPECTS: 10 CBF (<30%) Volume: 0mL Perfusion (Tmax>6.0s) volume: 0mL Mismatch Volume: 0mL Infarction Location:None IMPRESSION: 1. No large vessel occlusion. 2. Aortic atherosclerosis. 3. Atherosclerotic disease at both carotid bifurcations. 70% stenosis of the distal right ICA bulb. 33% stenosis of the proximal left ICA. 4. 50% stenosis of the right vertebral artery origin. 5. Atherosclerotic disease in the intracranial circulation. 30% stenosis of both carotid siphon regions. Mild stenosis of the left M1  segment. Areas of atherosclerotic narrowing and irregularity in the more distal branch vessels. 6. Atherosclerotic disease in the V4 segment of the left vertebral artery but without stenosis greater than 30%. 7. Atherosclerotic disease in the basilar artery but without significant stenosis. 8. No perfusion abnormality. Aortic Atherosclerosis (ICD10-I70.0). Electronically Signed   By: Paulina Fusi M.D.   On: 08/12/2022 18:12   CT HEAD CODE STROKE WO CONTRAST  Result Date: 08/12/2022 CLINICAL DATA:  Code stroke. EXAM: CT HEAD WITHOUT CONTRAST TECHNIQUE: Contiguous axial images were obtained from the base of the skull through the vertex without intravenous contrast. RADIATION DOSE REDUCTION: This exam was performed according to the departmental dose-optimization program which includes automated exposure control, adjustment of the mA and/or kV according to patient size and/or use of iterative reconstruction technique. COMPARISON:  12/02/2020 FINDINGS: Brain: Chronic brain atrophy with widespread chronic small-vessel ischemic changes of the pons and cerebral hemispheric white matter. Old lacunar infarctions of the thalami I left more than right. No visible acute cortical infarction. No mass, hemorrhage, hydrocephalus or extra-axial collection. Vascular: There is atherosclerotic calcification of the major vessels at the base of the brain. Skull: Negative Sinuses/Orbits: Clear/normal Other: None ASPECTS (Alberta Stroke Program Early CT Score) - Ganglionic level infarction (caudate, lentiform nuclei, internal capsule, insula, M1-M3 cortex): 7 - Supraganglionic infarction (M4-M6 cortex): 3 Total score (0-10 with 10 being normal): 10 IMPRESSION: 1. No acute CT finding. Atrophy and chronic small-vessel ischemic changes as outlined above. 2. Aspects is 10. These results were communicated to Dr. Amada Jupiter at 5:55 pm on 08/12/2022 by text page via the [facility] messaging system. Electronically Signed   By: Paulina Fusi M.D.    On: 08/12/2022 17:56     The results of significant diagnostics from this hospitalization (including imaging, microbiology, ancillary and laboratory) are listed below for reference.     Microbiology: No results found for this or any previous visit (from the past 240 hour(s)).   Labs: BNP (last 3 results) No results for input(s): "BNP" in the last 8760 hours. Basic Metabolic Panel: Recent Labs  Lab 08/13/22 0440 08/14/22 0354 08/15/22 0208 08/16/22 0228 08/17/22 0426  NA 137 138 135 133* 135  K 3.8 4.6 4.2 4.1 4.0  CL 104 103 100  101 106  CO2 20* 27 23 21* 20*  GLUCOSE 103* 127* 123* 140* 128*  BUN 11 13 20 17  24*  CREATININE 0.74 0.77 0.94 0.81 0.90  CALCIUM 9.3 9.7 9.4 9.2 9.4  MG  --  1.9 1.8 1.8 1.9   Liver Function Tests: Recent Labs  Lab 08/12/22 1745  AST 16  ALT 15  ALKPHOS 52  BILITOT 0.3  PROT 5.7*  ALBUMIN 3.5   No results for input(s): "LIPASE", "AMYLASE" in the last 168 hours. No results for input(s): "AMMONIA" in the last 168 hours. CBC: Recent Labs  Lab 08/12/22 1745 08/12/22 1752  WBC 5.2  --   NEUTROABS 3.1  --   HGB 11.6* 11.9*  HCT 36.2* 35.0*  MCV 85.4  --   PLT 257  --    Cardiac Enzymes: No results for input(s): "CKTOTAL", "CKMB", "CKMBINDEX", "TROPONINI" in the last 168 hours. BNP: Invalid input(s): "POCBNP" CBG: Recent Labs  Lab 08/16/22 1629 08/16/22 2102 08/17/22 0604 08/17/22 0633 08/17/22 1148  GLUCAP 184* 137* 113* 123* 189*   D-Dimer No results for input(s): "DDIMER" in the last 72 hours. Hgb A1c No results for input(s): "HGBA1C" in the last 72 hours. Lipid Profile No results for input(s): "CHOL", "HDL", "LDLCALC", "TRIG", "CHOLHDL", "LDLDIRECT" in the last 72 hours. Thyroid function studies No results for input(s): "TSH", "T4TOTAL", "T3FREE", "THYROIDAB" in the last 72 hours.  Invalid input(s): "FREET3" Anemia work up No results for input(s): "VITAMINB12", "FOLATE", "FERRITIN", "TIBC", "IRON", "RETICCTPCT"  in the last 72 hours. Urinalysis    Component Value Date/Time   COLORURINE YELLOW (A) 12/01/2020 2319   APPEARANCEUR HAZY (A) 12/01/2020 2319   LABSPEC 1.017 12/01/2020 2319   PHURINE 5.0 12/01/2020 2319   GLUCOSEU NEGATIVE 12/01/2020 2319   HGBUR NEGATIVE 12/01/2020 2319   BILIRUBINUR NEGATIVE 12/01/2020 2319   KETONESUR NEGATIVE 12/01/2020 2319   PROTEINUR NEGATIVE 12/01/2020 2319   UROBILINOGEN 0.2 04/15/2010 1425   NITRITE NEGATIVE 12/01/2020 2319   LEUKOCYTESUR NEGATIVE 12/01/2020 2319   Sepsis Labs Recent Labs  Lab 08/12/22 1745  WBC 5.2   Microbiology No results found for this or any previous visit (from the past 240 hour(s)).   Time coordinating discharge:  I have spent 35 minutes face to face with the patient and on the ward discussing the patients care, assessment, plan and disposition with other care givers. >50% of the time was devoted counseling the patient about the risks and benefits of treatment/Discharge disposition and coordinating care.   SIGNED:   Dimple Nanas, MD  [facility] 08/17/2022, 12:13 PM   If 7PM-7AM, please contact night-coverage

## 2022-08-17 NOTE — TOC Transition Note (Signed)
Transition of Care [facility]) - CM/SW Discharge Note   Patient Details  Name: Steve Ramirez MRN: [id number] Date of Birth: 1935/06/25  [facility]) CM/SW Contact:  Erin Sons, LCSW Phone Number: 08/17/2022, 1:25 PM   Clinical Narrative:     Berkley Harvey approved 8/5-8/7 Ref# [id number]  Patient will DC to: [facility] Anticipated DC date: 08/17/22 Family notified: Son Transport by: Sharin Mons   Per MD patient ready for DC to [facility]. RN, patient, patient's family, and facility notified of DC. Discharge Summary and FL2 sent to facility. RN to call report prior to discharge [phone] ). DC packet on chart. Ambulance transport requested for patient.   CSW will sign off for now as social work intervention is no longer needed. Please consult [place] again if new needs arise.    Final next level of care: Skilled Nursing Facility Barriers to Discharge: No Barriers Identified   Patient Goals and CMS Choice CMS Medicare.gov Compare Post Acute Care list provided to:: Patient Choice offered to / list presented to : Patient  Discharge Placement                Patient [facility] bed at: [facility] Patient to be transferred to facility by: [facility] Name of family member notified: Son [name] Patient and family notified of of transfer: 08/17/22  Discharge Plan and Services Additional resources added to the After Visit Summary for                                       Social Determinants of Health (SDOH) Interventions SDOH Screenings   Food Insecurity: No Food Insecurity (08/15/2022)  Housing: Patient Declined (08/15/2022)  Transportation Needs: No Transportation Needs (08/15/2022)  Utilities: Not At Risk (08/15/2022)  Alcohol Screen: Low Risk  (11/06/2021)  Social Connections: Unknown (05/26/2021)   Received from [facility]  Tobacco Use: Medium Risk (08/15/2022)     Readmission Risk Interventions     No data to display

## 2022-08-17 NOTE — Progress Notes (Signed)
Physical Therapy Treatment Patient Details Name: Steve Ramirez MRN: [id number] DOB: Jan 24, 1935 Today's Date: 08/17/2022   History of Present Illness 87 yo male presents to [facility] on 7/31 from ALF with c/c of acute aphasia and R weakness. MRI, CTH, CTA negative for acute findings. 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 Additional workup for AME. PMH includes CAD, HTN, prior strokes, DM, GIB.    PT Comments  Tolerated well, requiring mod assist for bed mobility and transfers out of bed. Demonstrates posterior lean with poor awareness while holding RW. Step pivot with mod assist for RW control, max VC for sequencing, and assist for balance. Paucity of speech. Able to recall prior career. Tolerated LE exercises with max VC. Patient will benefit from continued inpatient follow up therapy, <3 hours/day. Patient will continue to benefit from skilled physical therapy services to further improve independence with functional mobility.     If plan is discharge home, recommend the following: A lot of help with walking and/or transfers;A lot of help with bathing/dressing/bathroom;Direct supervision/assist for medications management;Direct supervision/assist for financial management   Can travel by private vehicle     Yes  Equipment Recommendations  None recommended by PT (defer to next venue of care)    Recommendations for Other Services       Precautions / Restrictions Precautions Precautions: Fall;Other (comment) Precaution Comments: incontinent Restrictions Weight Bearing Restrictions: No     Mobility  Bed Mobility Overal bed mobility: Needs Assistance Bed Mobility: Supine to Sit     Supine to sit: Mod assist, HOB elevated     General bed mobility comments: Mod assist to facilitate LEs to EOB and support trunk to rise. VC for sequencing and direction  throughout.    Transfers Overall transfer level: Needs assistance Equipment used: Rolling walker (2 wheels) Transfers: Sit to/from Stand, Bed to chair/wheelchair/BSC Sit to Stand: Mod assist   Step pivot transfers: Mod assist       General transfer comment: Mod assist for boost and balance. Hand placement and set-up with tactile cues. Leaning posteriorly. VC and tactile cues to facilitate forward lean with RW for support. Performed step pivot transfer towards Rt with mod assist to facilitate sequencing and steps, minor balance issue with posterior instability, assisted with RW control. Max VC throughout.    Ambulation/Gait               General Gait Details: NT, would recommend chair follow   Stairs             Wheelchair Mobility     Tilt Bed    Modified Rankin (Stroke Patients Only)       Balance Overall balance assessment: Needs assistance, History of Falls Sitting-balance support: Feet supported, No upper extremity supported Sitting balance-Leahy Scale: Fair Sitting balance - Comments: Sits with min guard   Standing balance support: Bilateral upper extremity supported, During functional activity, Reliant on assistive device for balance Standing balance-Leahy Scale: Poor Standing balance comment: posterior LOB; poor awareness                            Cognition Arousal/Alertness: Awake/alert Behavior During Therapy: Flat affect Overall Cognitive Status: No family/caregiver present to determine baseline cognitive functioning Area of Impairment: Following commands, Memory, Safety/judgement, Problem solving  Memory: Decreased short-term memory Following Commands: Follows one step commands with increased time, Follows one step commands inconsistently Safety/Judgement: Decreased awareness of safety, Decreased awareness of deficits   Problem Solving: Slow processing, Requires tactile cues, Requires verbal cues,  Difficulty sequencing, Decreased initiation General Comments: Pt with very poor initiation with all mobility tasks.        Exercises General Exercises - Lower Extremity Ankle Circles/Pumps: AROM, Both, 10 reps, Seated Quad Sets: Strengthening, Both, 10 reps, Seated Gluteal Sets: Strengthening, Both, 10 reps, Seated Long Arc Quad: Strengthening, Both, 10 reps, Seated    General Comments General comments (skin integrity, edema, etc.): Very slow processing with verbal puacity.      Pertinent Vitals/Pain Pain Assessment Pain Assessment: No/denies pain    Home Living                          Prior Function            PT Goals (current goals can now be found in the care plan section) Acute Rehab PT Goals Patient Stated Goal: none stated PT Goal Formulation: With patient Time For Goal Achievement: 08/27/22 Potential to Achieve Goals: Fair Progress towards PT goals: Progressing toward goals    Frequency    Min 1X/week      PT Plan Current plan remains appropriate    Co-evaluation              AM-PAC PT "6 Clicks" Mobility   Outcome Measure  Help needed turning from your back to your side while in a flat bed without using bedrails?: A Lot Help needed moving from lying on your back to sitting on the side of a flat bed without using bedrails?: A Lot Help needed moving to and from a bed to a chair (including a wheelchair)?: A Lot Help needed standing up from a chair using your arms (e.g., wheelchair or bedside chair)?: A Lot Help needed to walk in hospital room?: A Lot Help needed climbing 3-5 steps with a railing? : A Lot 6 Click Score: 12    End of Session Equipment Utilized During Treatment: Gait belt Activity Tolerance: Patient tolerated treatment well Patient left: in chair;with chair alarm set;with call bell/phone within reach Nurse Communication: Mobility status PT Visit Diagnosis: Other abnormalities of gait and mobility (R26.89);Muscle  weakness (generalized) (M62.81);Difficulty in walking, not elsewhere classified (R26.2)     Time: 6045-4098 PT Time Calculation (min) (ACUTE ONLY): 13 min  Charges:    $Therapeutic Activity: 8-22 mins PT General Charges $$ ACUTE PT VISIT: 1 Visit                     Kathlyn Sacramento, PT, DPT [facility]  Rehabilitation Services Physical Therapist Office: [phone] Website: [email or web address]    Berton Mount 08/17/2022, 12:08 PM

## 2022-09-12 ENCOUNTER — Other Ambulatory Visit: Payer: Self-pay

## 2022-09-12 ENCOUNTER — Emergency Department
Admission: EM | Admit: 2022-09-12 | Discharge: 2022-09-12 | Disposition: A | Discharge status: Home / Self Care | Payer: Medicare Other | Attending: Emergency Medicine | Admitting: Emergency Medicine

## 2022-09-12 ENCOUNTER — Emergency Department: Payer: Medicare Other

## 2022-09-12 ENCOUNTER — Encounter: Payer: Self-pay | Admitting: Emergency Medicine

## 2022-09-12 DIAGNOSIS — K8020 Calculus of gallbladder without cholecystitis without obstruction: Secondary | ICD-10-CM | POA: Diagnosis not present

## 2022-09-12 DIAGNOSIS — J40 Bronchitis, not specified as acute or chronic: Secondary | ICD-10-CM

## 2022-09-12 DIAGNOSIS — Z8673 Personal history of transient ischemic attack (TIA), and cerebral infarction without residual deficits: Secondary | ICD-10-CM | POA: Insufficient documentation

## 2022-09-12 DIAGNOSIS — I10 Essential (primary) hypertension: Secondary | ICD-10-CM | POA: Insufficient documentation

## 2022-09-12 DIAGNOSIS — Z1152 Encounter for screening for COVID-19: Secondary | ICD-10-CM | POA: Diagnosis not present

## 2022-09-12 DIAGNOSIS — Z96642 Presence of left artificial hip joint: Secondary | ICD-10-CM | POA: Diagnosis not present

## 2022-09-12 DIAGNOSIS — Z79899 Other long term (current) drug therapy: Secondary | ICD-10-CM | POA: Insufficient documentation

## 2022-09-12 DIAGNOSIS — I2510 Atherosclerotic heart disease of native coronary artery without angina pectoris: Secondary | ICD-10-CM | POA: Diagnosis not present

## 2022-09-12 DIAGNOSIS — Z7984 Long term (current) use of oral hypoglycemic drugs: Secondary | ICD-10-CM | POA: Diagnosis not present

## 2022-09-12 DIAGNOSIS — I700 Atherosclerosis of aorta: Secondary | ICD-10-CM | POA: Diagnosis not present

## 2022-09-12 DIAGNOSIS — Z96641 Presence of right artificial hip joint: Secondary | ICD-10-CM | POA: Diagnosis not present

## 2022-09-12 DIAGNOSIS — R0602 Shortness of breath: Secondary | ICD-10-CM | POA: Diagnosis present

## 2022-09-12 DIAGNOSIS — Z7982 Long term (current) use of aspirin: Secondary | ICD-10-CM | POA: Insufficient documentation

## 2022-09-12 DIAGNOSIS — J90 Pleural effusion, not elsewhere classified: Secondary | ICD-10-CM | POA: Insufficient documentation

## 2022-09-12 DIAGNOSIS — E119 Type 2 diabetes mellitus without complications: Secondary | ICD-10-CM | POA: Diagnosis not present

## 2022-09-12 LAB — RESP PANEL BY RT-PCR (RSV, FLU A&B, COVID)  RVPGX2
Influenza A by PCR: NEGATIVE
Influenza B by PCR: NEGATIVE
Resp Syncytial Virus by PCR: NEGATIVE
SARS Coronavirus 2 by RT PCR: NEGATIVE

## 2022-09-12 LAB — CBC WITH DIFFERENTIAL/PLATELET
Abs Immature Granulocytes: 0.03 10*3/uL (ref 0.00–0.07)
Basophils Absolute: 0.1 10*3/uL (ref 0.0–0.1)
Basophils Relative: 1 %
Eosinophils Absolute: 0.4 10*3/uL (ref 0.0–0.5)
Eosinophils Relative: 4 %
HCT: 39.6 % (ref 39.0–52.0)
Hemoglobin: 13 g/dL (ref 13.0–17.0)
Immature Granulocytes: 0 %
Lymphocytes Relative: 13 %
Lymphs Abs: 1.2 10*3/uL (ref 0.7–4.0)
MCH: 27.4 pg (ref 26.0–34.0)
MCHC: 32.8 g/dL (ref 30.0–36.0)
MCV: 83.4 fL (ref 80.0–100.0)
Monocytes Absolute: 0.8 10*3/uL (ref 0.1–1.0)
Monocytes Relative: 8 %
Neutro Abs: 6.9 10*3/uL (ref 1.7–7.7)
Neutrophils Relative %: 74 %
Platelets: 243 10*3/uL (ref 150–400)
RBC: 4.75 MIL/uL (ref 4.22–5.81)
RDW: 13.6 % (ref 11.5–15.5)
WBC: 9.3 10*3/uL (ref 4.0–10.5)
nRBC: 0 % (ref 0.0–0.2)

## 2022-09-12 LAB — COMPREHENSIVE METABOLIC PANEL
ALT: 14 U/L (ref 0–44)
AST: 11 U/L — ABNORMAL LOW (ref 15–41)
Albumin: 4.1 g/dL (ref 3.5–5.0)
Alkaline Phosphatase: 73 U/L (ref 38–126)
Anion gap: 9 (ref 5–15)
BUN: 15 mg/dL (ref 8–23)
CO2: 24 mmol/L (ref 22–32)
Calcium: 9.1 mg/dL (ref 8.9–10.3)
Chloride: 104 mmol/L (ref 98–111)
Creatinine, Ser: 0.77 mg/dL (ref 0.61–1.24)
GFR, Estimated: >60 mL/min (ref >60)
Glucose, Bld: 144 mg/dL — ABNORMAL HIGH (ref 70–99)
Potassium: 3.9 mmol/L (ref 3.5–5.1)
Sodium: 137 mmol/L (ref 135–145)
Total Bilirubin: 0.9 mg/dL (ref 0.3–1.2)
Total Protein: 7.2 g/dL (ref 6.5–8.1)

## 2022-09-12 LAB — BRAIN NATRIURETIC PEPTIDE: B Natriuretic Peptide: 61.5 pg/mL (ref 0.0–100.0)

## 2022-09-12 LAB — TROPONIN I (HIGH SENSITIVITY)
Troponin I (High Sensitivity): 11 ng/L (ref <18)
Troponin I (High Sensitivity): 8 ng/L (ref <18)

## 2022-09-12 LAB — LACTIC ACID, PLASMA: Lactic Acid, Venous: 1.6 mmol/L (ref 0.5–1.9)

## 2022-09-12 MED ORDER — IOHEXOL 300 MG/ML  SOLN
75.0000 mL | Freq: Once | INTRAMUSCULAR | Status: AC | PRN
  Administered 2022-09-12: 75 mL via INTRAVENOUS

## 2022-09-12 MED ORDER — SODIUM CHLORIDE 0.9 % IV BOLUS
500.0000 mL | Freq: Once | INTRAVENOUS | Status: AC
  Administered 2022-09-12: 500 mL via INTRAVENOUS

## 2022-09-12 NOTE — Discharge Instructions (Signed)
Continue and finish antibiotics as prescribed by your doctor.  Return to the ER for worsening symptoms, persistent vomiting, difficulty breathing or other concerns.

## 2022-09-12 NOTE — ED Triage Notes (Signed)
  Patient BIB EMS for SOB related to recent diagnosis of pneumonia.  Patient states he has been taking his antibiotics.  Denies any pain.  Endorses productive cough and SOB.

## 2022-09-12 NOTE — ED Provider Notes (Signed)
[facility] Provider Note    Event Date/Time   First MD Initiated Contact with Patient 09/12/22 0128     (approximate)   History   Shortness of Breath   HPI  Steve Ramirez is a 87 y.o. male brought to the ED via EMS from home place with a chief complaint of shortness of breath.  Patient was recently diagnosed with pneumonia and started on antibiotics.  Endorses productive cough and shortness of breath today.  Not usually on oxygen; room air saturation 90 per EMS.  Denies fever/chills, chest pain, abdominal pain, nausea, vomiting or dizziness.     Past Medical History   Past Medical History:  Diagnosis Date   Acute CVA (cerebrovascular accident) (HCC) 09/13/2015   Coronary atherosclerosis of native coronary artery    Essential hypertension, benign    GIB (gastrointestinal bleeding)    Colonic diverticular bleed   History of melanoma    History of TIAs    Intentional overdose (HCC) 11/05/2021   Right hemiparesis (HCC) 12/02/2016   Stroke (HCC)    Type 2 diabetes mellitus [facility])      Active Problem List   Patient Active Problem List   Diagnosis Date Noted   DNR (do not resuscitate)/DNI(Do Not Intubate) 08/12/2022   Major depressive disorder, recurrent severe without psychotic features (HCC) 11/06/2021   Major depressive disorder, single episode, severe without psychosis (HCC) 11/06/2021   Type 2 diabetes mellitus with diabetic neuropathy, without long-term current use of insulin (HCC) 11/05/2021   Pressure injury of skin 11/12/2020   AMS (altered mental status) 11/11/2020   Altered mental status 11/09/2020   Hypertensive urgency 11/09/2020   Depression 10/03/2020   Hypothyroidism 10/03/2020   Acute metabolic encephalopathy 10/03/2020   CAD (coronary artery disease)    Primary osteoarthritis of left hip 09/14/2018   Primary localized osteoarthritis of hip 08/02/2018   Primary osteoarthritis of right hip 07/07/2018   Near syncope  12/03/2016   CVA (cerebral vascular accident) (HCC) 12/03/2016   Essential hypertension 12/02/2016   Dysphagia 12/02/2016   TIA (transient ischemic attack) 11/29/2014   Shoulder stiffness 09/05/2012   Right rotator cuff tear 08/16/2012   Shoulder subluxation, right 08/09/2012   Elevated transaminase level 11/05/2011   Coronary atherosclerosis of native coronary artery 11/26/2010   Essential hypertension, benign 11/23/2010   Diabetes mellitus without complication (HCC) 11/23/2010   Hyperlipidemia 11/23/2010   History of TIAs 11/23/2010     Past Surgical History   Past Surgical History:  Procedure Laterality Date   COLONOSCOPY  08/13/2010   Procedure: COLONOSCOPY;  Surgeon: Malissa Hippo, MD;  Location: [facility];  Service: Endoscopy;  Laterality: N/A;   JOINT REPLACEMENT     KNEE SURGERY     arthroscopic   LEFT HEART CATHETERIZATION WITH CORONARY ANGIOGRAM N/A 11/24/2010   Procedure: LEFT HEART CATHETERIZATION WITH CORONARY ANGIOGRAM;  Surgeon: Wendall Stade, MD;  Location: [facility] CATH LAB;  Service: Cardiovascular;  Laterality: N/A;   neck fusion     years ago   TOTAL HIP ARTHROPLASTY Right 08/02/2018   Procedure: TOTAL HIP ARTHROPLASTY ANTERIOR APPROACH;  Surgeon: Sheral Apley, MD;  Location: [facility] ORS;  Service: Orthopedics;  Laterality: Right;   TOTAL HIP ARTHROPLASTY Left 10/18/2018   Procedure: TOTAL HIP ARTHROPLASTY ANTERIOR APPROACH;  Surgeon: Sheral Apley, MD;  Location: [facility] ORS;  Service: Orthopedics;  Laterality: Left;     Home Medications   Prior to Admission medications   Medication Sig Start Date  End Date Taking? Authorizing Provider  amLODipine (NORVASC) 10 MG tablet Take 1 tablet (10 mg total) by mouth daily. 11/14/21   Sarina Ill, DO  aspirin EC 81 MG tablet Take 1 tablet (81 mg total) by mouth 2 (two) times daily. For DVT prophylaxis for 30 days after surgery. 10/18/18   Albina Billet III, PA-C  cetirizine (ZYRTEC) 10 MG tablet  Take 10 mg by mouth daily as needed for allergies.    [provider]  cloNIDine (CATAPRES - DOSED IN MG/24 HR) 0.3 mg/24hr patch Place 1 patch (0.3 mg total) onto the skin once a week. Patient taking differently: Place 0.3 mg onto the skin See admin instructions. Apply 0.3 mg patch every Tuesday 11/16/21   Sarina Ill, DO  clopidogrel (PLAVIX) 75 MG tablet Take 1 tablet (75 mg total) by mouth daily. 11/15/21   Sarina Ill, DO  cyanocobalamin (VITAMIN B12) 1000 MCG tablet Take 1 tablet (1,000 mcg total) by mouth daily. 11/14/21   Sarina Ill, DO  escitalopram (LEXAPRO) 20 MG tablet Take 1 tablet (20 mg total) by mouth daily. 11/14/21   Sarina Ill, DO  ferrous sulfate 325 (65 FE) MG tablet Take 1 tablet (325 mg total) by mouth daily with breakfast. Patient taking differently: Take 325 mg by mouth daily. 11/14/21   Sarina Ill, DO  isosorbide mononitrate (IMDUR) 30 MG 24 hr tablet Take 1 tablet (30 mg total) by mouth every evening. Patient taking differently: Take 30 mg by mouth daily at 6 PM. 11/14/21   Sarina Ill, DO  lamoTRIgine (LAMICTAL) 25 MG tablet Take 25 mg by mouth in the morning.    [provider]  levETIRAcetam (KEPPRA) 500 MG tablet Take 1 tablet (500 mg total) by mouth 2 (two) times daily. 08/15/22   Amin, Ankit C, MD  levothyroxine (SYNTHROID) 50 MCG tablet Take 1 tablet (50 mcg total) by mouth daily. 11/14/21   Sarina Ill, DO  lisinopril (ZESTRIL) 40 MG tablet Take 1 tablet (40 mg total) by mouth daily. 11/14/21   Sarina Ill, DO  loratadine (CLARITIN) 10 MG tablet Take 10 mg by mouth daily as needed for allergies.    [provider]  melatonin 3 MG TABS tablet Take 3 mg by mouth at bedtime.    [provider]  metFORMIN (GLUCOPHAGE) 1000 MG tablet Take 1 tablet (1,000 mg total) by mouth 2 (two) times daily with a meal. 11/14/21   Sarina Ill, DO   metoprolol succinate (TOPROL-XL) 25 MG 24 hr tablet Take 25 mg by mouth daily at 6 PM.    [provider]  Multiple Vitamin (MULTIVITAMIN WITH MINERALS) TABS tablet Take 1 tablet by mouth daily. 11/15/21   Sarina Ill, DO  nitroGLYCERIN (NITROSTAT) 0.4 MG SL tablet Place 1 tablet (0.4 mg total) under the tongue every 5 (five) minutes as needed for chest pain. 11/14/21   Sarina Ill, DO  Omega-3 Fatty Acids (FISH OIL) 1000 MG CAPS Take 1,000 mg by mouth daily.    [provider]  pravastatin (PRAVACHOL) 40 MG tablet Take 40 mg by mouth at bedtime.    [provider]  risperiDONE (RISPERDAL) 0.5 MG tablet Take 1 tablet (0.5 mg total) by mouth 2 (two) times daily at 8 am and 4 pm. Patient taking differently: Take 0.5 mg by mouth 2 (two) times daily. 11/14/21   Sarina Ill, DO  rOPINIRole (REQUIP) 1 MG tablet Take 1 mg  by mouth at bedtime.    [provider]  rosuvastatin (CRESTOR) 40 MG tablet Take 1 tablet (40 mg total) by mouth daily. 11/26/10 04/08/11  Creig Hines, NP     Allergies  Patient has no known allergies.   Family History   Family History  Problem Relation Age of Onset   Coronary artery disease Father    Asthma Other    Emphysema Mother    Coronary artery disease Paternal Uncle    Brain cancer Brother      Physical Exam  Triage Vital Signs: ED Triage Vitals  Encounter Vitals Group     BP      Systolic BP Percentile      Diastolic BP Percentile      Pulse      Resp      Temp      Temp src      SpO2      Weight      Height      Head Circumference      Peak Flow      Pain Score      Pain Loc      Pain Education      Exclude from Growth Chart     Updated Vital Signs: BP (!) 145/65   Pulse 63   Temp 98.9 F (37.2 C) (Oral)   Resp (!) 21   Ht 5\' 8"  (1.727 m)   Wt 82.1 kg   SpO2 95%   BMI 27.52 kg/m    General: Awake, mild distress.  CV:  RRR.  Good peripheral  perfusion.  Resp:  Increased effort.  Diminished, scattered rhonchi. Abd:  Nontender.  No distention.  Other:  No pedal edema.   ED Results / Procedures / Treatments  Labs (all labs ordered are listed, but only abnormal results are displayed) Labs Reviewed  COMPREHENSIVE METABOLIC PANEL - Abnormal; Notable for the following components:      Result Value   Glucose, Bld 144 (*)    AST 11 (*)    All other components within normal limits  RESP PANEL BY RT-PCR (RSV, FLU A&B, COVID)  [id number]  CULTURE, BLOOD (ROUTINE X 2)  CULTURE, BLOOD (ROUTINE X 2)  CBC WITH DIFFERENTIAL/PLATELET  BRAIN NATRIURETIC PEPTIDE  LACTIC ACID, PLASMA  TROPONIN I (HIGH SENSITIVITY)  TROPONIN I (HIGH SENSITIVITY)     EKG  ED ECG REPORT I, Aldine Chakraborty J, the attending physician, personally viewed and interpreted this ECG.   Date: 09/12/2022  EKG Time: 0131  Rate: 70  Rhythm: normal sinus rhythm  Axis: Normal  Intervals:right bundle branch block  ST&T Change: Nonspecific    RADIOLOGY I have independently visualized and interpreted patient's x-ray as well as noted the radiology interpretation:  Chest x-ray: Small left pleural effusion  CT Chest with contrast: Bronchitis, groundglass opacities favoring atelectasis, cholelithiasis  Official radiology report(s): CT Chest W Contrast  Result Date: 09/12/2022 CLINICAL DATA:  Pneumonia, complication suspected, xray done. Shortness of breath. Recent diagnosis of pneumonia. EXAM: CT CHEST WITH CONTRAST TECHNIQUE: Multidetector CT imaging of the chest was performed during intravenous contrast administration. RADIATION DOSE REDUCTION: This exam was performed according to the departmental dose-optimization program which includes automated exposure control, adjustment of the mA and/or kV according to patient size and/or use of iterative reconstruction technique. CONTRAST:  75mL OMNIPAQUE IOHEXOL 300 MG/ML  SOLN COMPARISON:  Chest x-ray today. FINDINGS:  Cardiovascular: Heart is normal size. Aorta is normal caliber. Diffuse coronary artery and scattered  aortic calcifications. Mediastinum/Nodes: No mediastinal, hilar, or axillary adenopathy. Trachea and esophagus are unremarkable. Thyroid unremarkable. Lungs/Pleura: Mild peribronchial thickening. Ground-glass opacities in the lung bases, likely atelectasis. No confluent opacities or effusions. Upper Abdomen: No acute findings. Gallstone noted within the gallbladder. Musculoskeletal: Chest wall soft tissues are unremarkable. No acute bony abnormality. IMPRESSION: Mild peribronchial thickening suggesting bronchitis. Bibasilar ground-glass opacities, favor atelectasis. Diffuse coronary artery disease. Cholelithiasis Aortic Atherosclerosis (ICD10-I70.0). Electronically Signed   By: Charlett Nose M.D.   On: 09/12/2022 03:55   DG Chest Port 1 View  Result Date: 09/12/2022 CLINICAL DATA:  Shortness of breath EXAM: PORTABLE CHEST 1 VIEW COMPARISON:  12/02/2020 FINDINGS: Stable cardiomediastinal silhouette. Aortic atherosclerotic calcification. No focal consolidation or pneumothorax. Small left pleural effusion. No displaced rib fractures. IMPRESSION: Small left pleural effusion.  Chest otherwise negative. Electronically Signed   By: Minerva Fester M.D.   On: 09/12/2022 01:44     PROCEDURES:  Critical Care performed: No  .1-3 Lead EKG Interpretation  Performed by: Irean Hong, MD Authorized by: Irean Hong, MD     Interpretation: normal     ECG rate:  70   ECG rate assessment: normal     Rhythm: sinus rhythm     Ectopy: none     Conduction: normal   Comments:     Patient placed on cardiac monitor to evaluate for arrhythmias    MEDICATIONS ORDERED IN ED: Medications  sodium chloride 0.9 % bolus 500 mL (500 mLs Intravenous New Bag/Given 09/12/22 0357)  iohexol (OMNIPAQUE) 300 MG/ML solution 75 mL (75 mLs Intravenous Contrast Given 09/12/22 0341)     IMPRESSION / MDM / ASSESSMENT AND PLAN / ED  COURSE  I reviewed the triage vital signs and the nursing notes.                             87 year old male presenting with cough and shortness of breath after starting antibiotics for diagnosed pneumonia. Differential includes, but is not limited to, viral syndrome, bronchitis including COPD exacerbation, pneumonia, reactive airway disease including asthma, CHF including exacerbation with or without pulmonary/interstitial edema, pneumothorax, ACS, thoracic trauma, and pulmonary embolism.  I personally reviewed patient's records and note a recent hospitalization from 7/31 - 08/17/2022 for TIA.  Patient's presentation is most consistent with acute presentation with potential threat to life or bodily function.  The patient is on the cardiac monitor to evaluate for evidence of arrhythmia and/or significant heart rate changes.  Will obtain ED sepsis protocol, chest x-ray, COVID swab.  Will reassess.  Clinical Course as of 09/12/22 1610  Sat Sep 12, 2022  9604 Laboratory results unremarkable, lactic acid negative, chest x-ray unremarkable.  Will obtain CT chest with contrast to further characterize lungs. [JS]  0511 CT chest demonstrates bronchitis, no pneumonia.  Patient has maintained durations room air above 92% while in the emergency department.  He is currently 96% on room air.  He is stable and safe for discharge back to his facility to continue his antibiotics.  Strict return precautions given.  Patient verbalizes understanding and agrees with plan of care. [JS]    Clinical Course User Index [JS] Irean Hong, MD     FINAL CLINICAL IMPRESSION(S) / ED DIAGNOSES   Final diagnoses:  Shortness of breath  Bronchitis     Rx / DC Orders   ED Discharge Orders     None        Note:  This document was prepared using Dragon voice recognition software and may include unintentional dictation errors.   Irean Hong, MD 09/12/22 [phone]

## 2022-09-12 NOTE — ED Notes (Signed)
[facility] called for transport to [facility]

## 2022-09-17 LAB — CULTURE, BLOOD (ROUTINE X 2)
Culture: NO GROWTH
Culture: NO GROWTH

## 2022-09-22 ENCOUNTER — Other Ambulatory Visit: Payer: Self-pay

## 2022-09-22 ENCOUNTER — Emergency Department: Payer: Medicare Other

## 2022-09-22 ENCOUNTER — Emergency Department
Admission: EM | Admit: 2022-09-22 | Discharge: 2022-09-22 | Disposition: A | Discharge status: Hospice | Payer: Medicare Other | Attending: Emergency Medicine | Admitting: Emergency Medicine

## 2022-09-22 DIAGNOSIS — M2578 Osteophyte, vertebrae: Secondary | ICD-10-CM | POA: Diagnosis not present

## 2022-09-22 DIAGNOSIS — E119 Type 2 diabetes mellitus without complications: Secondary | ICD-10-CM | POA: Diagnosis not present

## 2022-09-22 DIAGNOSIS — I6782 Cerebral ischemia: Secondary | ICD-10-CM | POA: Diagnosis not present

## 2022-09-22 DIAGNOSIS — S50811A Abrasion of right forearm, initial encounter: Secondary | ICD-10-CM | POA: Diagnosis present

## 2022-09-22 DIAGNOSIS — W1830XA Fall on same level, unspecified, initial encounter: Secondary | ICD-10-CM | POA: Insufficient documentation

## 2022-09-22 DIAGNOSIS — M4802 Spinal stenosis, cervical region: Secondary | ICD-10-CM | POA: Diagnosis not present

## 2022-09-22 DIAGNOSIS — W19XXXA Unspecified fall, initial encounter: Secondary | ICD-10-CM

## 2022-09-22 LAB — CBG MONITORING, ED: Glucose-Capillary: 164 mg/dL — ABNORMAL HIGH (ref 70–99)

## 2022-09-22 MED ORDER — BACITRACIN ZINC 500 UNIT/GM EX OINT
TOPICAL_OINTMENT | Freq: Two times a day (BID) | CUTANEOUS | Status: DC
  Administered 2022-09-22: 1 via TOPICAL
  Filled 2022-09-22: qty 0.9

## 2022-09-22 NOTE — ED Notes (Signed)
Daughter in law, Leotis Shames called and updated per pt's d/c plan. Stated she is unable to pick pt up. [facility] called concerning transportation, stated they will call this RN back.

## 2022-09-22 NOTE — ED Notes (Signed)
[facility] called stating someone is on their way to pick up pt.

## 2022-09-22 NOTE — ED Notes (Signed)
Bacitracin applied and arm wrapped. Pt tolerated procedure well

## 2022-09-22 NOTE — ED Provider Notes (Signed)
[facility] Provider Note    Event Date/Time   First MD Initiated Contact with Patient 09/22/22 [phone]     (approximate)   History   Fall   HPI  Steve Ramirez is a 87 y.o. male with history of type 2 diabetes, prior strokes, here with mechanical fall.  The patient states he was bending down to get something off the ground when he fell forward.  He scratched his right forearm on his walker.  Reports he does not believe he hit his head.  He does take blood thinners however.  Denies any other complaints.  No headache.  No neck pain or neck stiffness.  No focal numbness or weakness.  Denies any recent illnesses.  Denies any significant pain of the right upper extremity.     Physical Exam   Triage Vital Signs: ED Triage Vitals  Encounter Vitals Group     BP 09/22/22 0935 (!) 150/63     Systolic BP Percentile --      Diastolic BP Percentile --      Pulse Rate 09/22/22 0935 77     Resp 09/22/22 0935 10     Temp 09/22/22 0939 97.6 F (36.4 C)     Temp Source 09/22/22 0939 Oral     SpO2 09/22/22 0935 97 %     Weight 09/22/22 0939 180 lb (81.6 kg)     Height 09/22/22 0939 5\' 8"  (1.727 m)     Head Circumference --      Peak Flow --      Pain Score 09/22/22 0939 0     Pain Loc --      Pain Education --      Exclude from Growth Chart --     Most recent vital signs: Vitals:   09/22/22 0935 09/22/22 0939  BP: (!) 150/63   Pulse: 77   Resp: 10   Temp:  97.6 F (36.4 C)  SpO2: 97%      General: Awake, no distress.  CV:  Good peripheral perfusion.  Regular rate and rhythm.  No murmurs or rubs. Resp:  Normal work of breathing.  Lungs clear auscultation bilaterally. Abd:  No distention.  No tenderness.  No guarding or rebound. Other:  Superficial skin tear to the right forearm, no gaping wounds or lacerations.  Distal neurovasculature is intact.  Patient is at his mental baseline.  No focal neurological deficits.   ED Results / Procedures /  Treatments   Labs (all labs ordered are listed, but only abnormal results are displayed) Labs Reviewed  CBG MONITORING, ED - Abnormal; Notable for the following components:      Result Value   Glucose-Capillary 164 (*)    All other components within normal limits     EKG    RADIOLOGY CT head/C-spine: No acute abnormality   I also independently reviewed and agree with radiologist interpretations.   PROCEDURES:  Critical Care performed: No   MEDICATIONS ORDERED IN ED: Medications  bacitracin ointment (1 Application Topical Given 09/22/22 1012)     IMPRESSION / MDM / ASSESSMENT AND PLAN / ED COURSE  I reviewed the triage vital signs and the nursing notes.                              Differential diagnosis includes, but is not limited to, mechanical fall, TBI, ICH  Patient's presentation is most consistent with acute presentation with potential  threat to life or bodily function.  87 yo M here with mechanical fall and superficial skin tear. CT Head/C Spine negative. Skin tear cleaned. Tetanus is UTD. Pt is otherwise at his mental and physical baseline, VSS.     FINAL CLINICAL IMPRESSION(S) / ED DIAGNOSES   Final diagnoses:  Fall, initial encounter     Rx / DC Orders   ED Discharge Orders     None        Note:  This document was prepared using Dragon voice recognition software and may include unintentional dictation errors.   Shaune Pollack, MD 09/22/22 [phone]

## 2022-09-22 NOTE — Discharge Instructions (Signed)
For your skin tear: Keep it clean, dry, and cover with antibiotic ointment or non stick dressing until healed  Take Tylenol 650 mg every 4-6 hours for pain as needed

## 2022-09-22 NOTE — ED Triage Notes (Signed)
Pt from [facility] via [facility]. Larey Seat out of chair this morning reaching for eggs. Pt does not remember hitting head, no LOC. Only visible injury was left forearm abrasion which EMS wrapped in gauze. Denies pain. Pt on Plavix for multiple stroke hx. Pt A&Ox4.  EMS Vitals: BP 156/79 HR 82 99% RA

## 2022-10-13 ENCOUNTER — Ambulatory Visit: Payer: Medicare Other | Admitting: Diagnostic Neuroimaging

## 2023-01-22 ENCOUNTER — Other Ambulatory Visit: Payer: Self-pay

## 2023-01-22 ENCOUNTER — Emergency Department: Payer: Medicare Other

## 2023-01-22 ENCOUNTER — Emergency Department
Admission: EM | Admit: 2023-01-22 | Discharge: 2023-01-23 | Disposition: A | Discharge status: Skilled Nursing Facility | Payer: Medicare Other | Attending: Emergency Medicine | Admitting: Emergency Medicine

## 2023-01-22 DIAGNOSIS — I452 Bifascicular block: Secondary | ICD-10-CM | POA: Insufficient documentation

## 2023-01-22 DIAGNOSIS — I10 Essential (primary) hypertension: Secondary | ICD-10-CM | POA: Insufficient documentation

## 2023-01-22 DIAGNOSIS — Z7902 Long term (current) use of antithrombotics/antiplatelets: Secondary | ICD-10-CM | POA: Insufficient documentation

## 2023-01-22 DIAGNOSIS — I2510 Atherosclerotic heart disease of native coronary artery without angina pectoris: Secondary | ICD-10-CM | POA: Diagnosis not present

## 2023-01-22 DIAGNOSIS — I6782 Cerebral ischemia: Secondary | ICD-10-CM | POA: Diagnosis not present

## 2023-01-22 DIAGNOSIS — E119 Type 2 diabetes mellitus without complications: Secondary | ICD-10-CM | POA: Insufficient documentation

## 2023-01-22 DIAGNOSIS — R569 Unspecified convulsions: Secondary | ICD-10-CM | POA: Diagnosis present

## 2023-01-22 DIAGNOSIS — Z7982 Long term (current) use of aspirin: Secondary | ICD-10-CM | POA: Insufficient documentation

## 2023-01-22 DIAGNOSIS — Z8673 Personal history of transient ischemic attack (TIA), and cerebral infarction without residual deficits: Secondary | ICD-10-CM | POA: Diagnosis not present

## 2023-01-22 DIAGNOSIS — Z79899 Other long term (current) drug therapy: Secondary | ICD-10-CM | POA: Diagnosis not present

## 2023-01-22 DIAGNOSIS — Z20822 Contact with and (suspected) exposure to covid-19: Secondary | ICD-10-CM | POA: Insufficient documentation

## 2023-01-22 LAB — CBC WITH DIFFERENTIAL/PLATELET
Abs Immature Granulocytes: 0.02 10*3/uL (ref 0.00–0.07)
Basophils Absolute: 0.1 10*3/uL (ref 0.0–0.1)
Basophils Relative: 1 %
Eosinophils Absolute: 0.4 10*3/uL (ref 0.0–0.5)
Eosinophils Relative: 7 %
HCT: 42.1 % (ref 39.0–52.0)
Hemoglobin: 14 g/dL (ref 13.0–17.0)
Immature Granulocytes: 0 %
Lymphocytes Relative: 24 %
Lymphs Abs: 1.3 10*3/uL (ref 0.7–4.0)
MCH: 26.8 pg (ref 26.0–34.0)
MCHC: 33.3 g/dL (ref 30.0–36.0)
MCV: 80.7 fL (ref 80.0–100.0)
Monocytes Absolute: 0.5 10*3/uL (ref 0.1–1.0)
Monocytes Relative: 9 %
Neutro Abs: 3.2 10*3/uL (ref 1.7–7.7)
Neutrophils Relative %: 59 %
Platelets: 262 10*3/uL (ref 150–400)
RBC: 5.22 MIL/uL (ref 4.22–5.81)
RDW: 14.3 % (ref 11.5–15.5)
WBC: 5.5 10*3/uL (ref 4.0–10.5)
nRBC: 0 % (ref 0.0–0.2)

## 2023-01-22 LAB — COMPREHENSIVE METABOLIC PANEL
ALT: 11 U/L (ref 0–44)
AST: 11 U/L — ABNORMAL LOW (ref 15–41)
Albumin: 4.2 g/dL (ref 3.5–5.0)
Alkaline Phosphatase: 73 U/L (ref 38–126)
Anion gap: 11 (ref 5–15)
BUN: 19 mg/dL (ref 8–23)
CO2: 24 mmol/L (ref 22–32)
Calcium: 9.3 mg/dL (ref 8.9–10.3)
Chloride: 105 mmol/L (ref 98–111)
Creatinine, Ser: 0.9 mg/dL (ref 0.61–1.24)
GFR, Estimated: >60 mL/min (ref >60)
Glucose, Bld: 141 mg/dL — ABNORMAL HIGH (ref 70–99)
Potassium: 4.1 mmol/L (ref 3.5–5.1)
Sodium: 140 mmol/L (ref 135–145)
Total Bilirubin: 0.5 mg/dL (ref 0.0–1.2)
Total Protein: 6.7 g/dL (ref 6.5–8.1)

## 2023-01-22 LAB — URINALYSIS, ROUTINE W REFLEX MICROSCOPIC
Bacteria, UA: NONE SEEN
Bilirubin Urine: NEGATIVE
Glucose, UA: NEGATIVE mg/dL
Hgb urine dipstick: NEGATIVE
Ketones, ur: NEGATIVE mg/dL
Nitrite: NEGATIVE
Protein, ur: NEGATIVE mg/dL
Specific Gravity, Urine: 1.026 (ref 1.005–1.030)
pH: 5 (ref 5.0–8.0)

## 2023-01-22 LAB — APTT: aPTT: 26 s (ref 24–36)

## 2023-01-22 LAB — RESP PANEL BY RT-PCR (RSV, FLU A&B, COVID)  RVPGX2
Influenza A by PCR: NEGATIVE
Influenza B by PCR: NEGATIVE
Resp Syncytial Virus by PCR: NEGATIVE
SARS Coronavirus 2 by RT PCR: NEGATIVE

## 2023-01-22 LAB — TROPONIN I (HIGH SENSITIVITY)
Troponin I (High Sensitivity): 8 ng/L (ref <18)
Troponin I (High Sensitivity): 8 ng/L (ref <18)

## 2023-01-22 LAB — CBG MONITORING, ED: Glucose-Capillary: 149 mg/dL — ABNORMAL HIGH (ref 70–99)

## 2023-01-22 LAB — PROTIME-INR
INR: 1.1 (ref 0.8–1.2)
Prothrombin Time: 14.1 s (ref 11.4–15.2)

## 2023-01-22 MED ORDER — LEVETIRACETAM IN NACL 1000 MG/100ML IV SOLN
1000.0000 mg | Freq: Once | INTRAVENOUS | Status: AC
  Administered 2023-01-22: 1000 mg via INTRAVENOUS
  Filled 2023-01-22: qty 100

## 2023-01-22 MED ORDER — LEVETIRACETAM 500 MG PO TABS: 1000.0000 mg | ORAL_TABLET | Freq: Two times a day (BID) | ORAL | Status: AC

## 2023-01-22 NOTE — ED Triage Notes (Signed)
 Per EMS, the facility called out for a 10 minute episode of staring off into space and not blinking per family and staff. Hx of seizures and TIA. CBG 226 with EMS.

## 2023-01-22 NOTE — Discharge Instructions (Addendum)
 I suspect that he had a seizure tonight.  Increase the Keppra  to thousand twice daily.  He is acting at his normal self now I discussed with the son and they are okay with patient going back home.  If he develops episodes of passing out I would recommend that he return to the ER and follow-up with a cardiologist

## 2023-01-22 NOTE — ED Notes (Signed)
 Lab unable to run Keppra level off of the tube that was sent. Fuller Plan, MD made aware and is okay without having the keppra level. Patient to be D/c'd and will need transport back to the facility. Aundra Millet, RN made aware.

## 2023-01-22 NOTE — ED Provider Notes (Signed)
 [facility] Provider Note    Event Date/Time   First MD Initiated Contact with Patient 01/22/23 1945     (approximate)   History   Altered Mental Status   HPI  Steve Ramirez is a 88 y.o. male  coronary disease, hypertension, diabetes, depression, history of TIAs, recently diagnosed seizures started on keppra .  Patient lives at [facility] which is an assisted living.  He lives there with his wife.   I reviewed the note from 08/17/2022 when patient was discharged after a stroke workup from 08/12/2022 until 08/17/2022.  Patient was started on Keppra  twice daily due to some left temporal slowing on EEG and neurology thought his TIA episodes were actually focal seizures.  Did have a 70% right sided internal carotid stenosis.  Patient is on Plavix , aspirin .  According to facility patient had about a 10-minute episode where he was staring off in space and not blinking.SABRA  He came back to and is currently alert and oriented x 2.  Patient himself denies any chest pain, abdominal pain weakness on one side.  He does not remember this episode.      Physical Exam   Triage Vital Signs: ED Triage Vitals  Encounter Vitals Group     BP 01/22/23 1945 (!) 154/59     Systolic BP Percentile --      Diastolic BP Percentile --      Pulse Rate 01/22/23 1945 65     Resp 01/22/23 1945 18     Temp 01/22/23 1945 97.8 F (36.6 C)     Temp Source 01/22/23 1945 Oral     SpO2 01/22/23 1945 95 %     Weight 01/22/23 1948 174 lb (78.9 kg)     Height 01/22/23 1948 5' 8 (1.727 m)     Head Circumference --      Peak Flow --      Pain Score 01/22/23 1946 0     Pain Loc --      Pain Education --      Exclude from Growth Chart --     Most recent vital signs: Vitals:   01/22/23 1945  BP: (!) 154/59  Pulse: 65  Resp: 18  Temp: 97.8 F (36.6 C)  SpO2: 95%     General: Awake, no distress.  CV:  Good peripheral perfusion.  Resp:  Normal effort.  Abd:  No  distention.  Other:  Alert and oriented x 2.  Does not know the year.  Equal strength in arms and legs.  Cranial nerves appear intact.   ED Results / Procedures / Treatments   Labs (all labs ordered are listed, but only abnormal results are displayed) Labs Reviewed  CBG MONITORING, ED - Abnormal; Notable for the following components:      Result Value   Glucose-Capillary 149 (*)    All other components within normal limits  RESP PANEL BY RT-PCR (RSV, FLU A&B, COVID)  RVPGX2  CBC WITH DIFFERENTIAL/PLATELET  COMPREHENSIVE METABOLIC PANEL  PROTIME-INR  APTT  URINALYSIS, ROUTINE W REFLEX MICROSCOPIC  LEVETIRACETAM  LEVEL  TROPONIN I (HIGH SENSITIVITY)     EKG  My interpretation of EKG:  Normal sinus rate of 65 without any ST elevation or T wave version except for lead V3.  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  RADIOLOGY I have reviewed the CT personally interpreted and no ICH   PROCEDURES:  Critical Care performed: No  .1-3  Lead EKG Interpretation  Performed by: Ernest Ronal BRAVO, MD Authorized by: Ernest Ronal BRAVO, MD     Interpretation: normal     ECG rate:  60   ECG rate assessment: normal     Rhythm: sinus rhythm     Ectopy: none     Conduction: normal      MEDICATIONS ORDERED IN ED: Medications  levETIRAcetam  (KEPPRA ) IVPB 1000 mg/100 mL premix (has no administration in time range)     IMPRESSION / MDM / ASSESSMENT AND PLAN / ED COURSE  I reviewed the triage vital signs and the nursing notes.   Patient's presentation is most consistent with acute presentation with potential threat to life or bodily function.   Patient comes in with an episode of staring off into space I suspect this is related to a seizure given patient's prior history.  I did call patient's son to discuss CT stroke workup given he is had this done multiple times.  Given it sounds like he is at his baseline self now he is agreeable to holding off on  stroke workup.  Unlikely to change management given he is otherwise seemingly at his baseline self and he is already on aspirin , Plavix  had multiple admissions for stroke workup.  I suspect that this is more likely a seizure.  Will give a dose of IV Keppra .  We discussed going up on his Keppra  to 1000 twice daily.  Given his history of depression we discussed how sometimes can affect mood and if they start noticing a change in his mood they would need to discuss with a neurologist about changing medications.  Will get labs to evaluate for any electrolyte abnormalities, AKI, UTI.  We did get EKG, cardiac markers to evaluate for ACS but this does not like a syncopal episode.  He has a bifascicular block but I reviewed his prior EKGs and he is had this previously.  Discussed with the son who is the POA who states that they want to try to keep him out of the hospital given he does not do well when he is admitted.  MPRESSION: 1. No acute intracranial abnormality. No skull fracture. 2. Stable atrophy and chronic small vessel ischemia.   Reports feeling at his self.  Will ambulate patient.  Discussed with the son.  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  They will keep an eye out that if he starts having syncopal episodes they will have him follow-up with cardiology or bring him back into the emergency room otherwise they would like to try to avoid any large surgery.  Will discharge patient home at this time.   The patient is on the cardiac monitor to evaluate for evidence of arrhythmia and/or significant heart rate changes.      FINAL CLINICAL IMPRESSION(S) / ED DIAGNOSES   Final diagnoses:  Partial seizure (HCC)     Rx / DC Orders   ED Discharge Orders     None         Note:  This document was prepared using Dragon voice recognition software and may include unintentional dictation errors.   Ernest Ronal BRAVO, MD 01/22/23 2228

## 2023-01-23 NOTE — ED Notes (Signed)
Pt cleaned up and brief changed

## 2023-01-23 NOTE — ED Notes (Signed)
 [name] Hedden (patients son) contacted about patient up for discharge. Son reports he will be coming shortly to take patient back to assisted living

## 2023-01-23 NOTE — ED Notes (Signed)
 Pt picked up and taken home by [employer]

## 2023-01-25 LAB — LEVETIRACETAM LEVEL: Levetiracetam Lvl: 22.8 ug/mL (ref 10.0–40.0)

## 2023-01-28 NOTE — Progress Notes (Signed)
GIVEN ams covid/flu/rsv and caogs ordered

## 2023-03-02 ENCOUNTER — Emergency Department: Payer: Medicare Other

## 2023-03-02 ENCOUNTER — Encounter: Payer: Self-pay | Admitting: Emergency Medicine

## 2023-03-02 ENCOUNTER — Other Ambulatory Visit: Payer: Self-pay

## 2023-03-02 ENCOUNTER — Emergency Department
Admission: EM | Admit: 2023-03-02 | Discharge: 2023-03-03 | Disposition: A | Discharge status: Skilled Nursing Facility | Payer: Medicare Other | Attending: Emergency Medicine | Admitting: Emergency Medicine

## 2023-03-02 DIAGNOSIS — F0390 Unspecified dementia without behavioral disturbance: Secondary | ICD-10-CM | POA: Diagnosis not present

## 2023-03-02 DIAGNOSIS — E119 Type 2 diabetes mellitus without complications: Secondary | ICD-10-CM | POA: Diagnosis not present

## 2023-03-02 DIAGNOSIS — Z043 Encounter for examination and observation following other accident: Secondary | ICD-10-CM | POA: Insufficient documentation

## 2023-03-02 DIAGNOSIS — W19XXXA Unspecified fall, initial encounter: Secondary | ICD-10-CM | POA: Diagnosis not present

## 2023-03-02 DIAGNOSIS — I10 Essential (primary) hypertension: Secondary | ICD-10-CM | POA: Diagnosis not present

## 2023-03-02 LAB — CBC WITH DIFFERENTIAL/PLATELET
Abs Immature Granulocytes: 0.01 10*3/uL (ref 0.00–0.07)
Basophils Absolute: 0 10*3/uL (ref 0.0–0.1)
Basophils Relative: 1 %
Eosinophils Absolute: 0.2 10*3/uL (ref 0.0–0.5)
Eosinophils Relative: 3 %
HCT: 43.2 % (ref 39.0–52.0)
Hemoglobin: 14.7 g/dL (ref 13.0–17.0)
Immature Granulocytes: 0 %
Lymphocytes Relative: 10 %
Lymphs Abs: 0.6 10*3/uL — ABNORMAL LOW (ref 0.7–4.0)
MCH: 27.4 pg (ref 26.0–34.0)
MCHC: 34 g/dL (ref 30.0–36.0)
MCV: 80.6 fL (ref 80.0–100.0)
Monocytes Absolute: 0.6 10*3/uL (ref 0.1–1.0)
Monocytes Relative: 10 %
Neutro Abs: 4.8 10*3/uL (ref 1.7–7.7)
Neutrophils Relative %: 76 %
Platelets: 249 10*3/uL (ref 150–400)
RBC: 5.36 MIL/uL (ref 4.22–5.81)
RDW: 14 % (ref 11.5–15.5)
WBC: 6.2 10*3/uL (ref 4.0–10.5)
nRBC: 0 % (ref 0.0–0.2)

## 2023-03-02 LAB — URINALYSIS, W/ REFLEX TO CULTURE (INFECTION SUSPECTED)
Bacteria, UA: NONE SEEN
Bilirubin Urine: NEGATIVE
Glucose, UA: NEGATIVE mg/dL
Ketones, ur: NEGATIVE mg/dL
Leukocytes,Ua: NEGATIVE
Nitrite: NEGATIVE
Protein, ur: 30 mg/dL — AB
Specific Gravity, Urine: 1.023 (ref 1.005–1.030)
pH: 5 (ref 5.0–8.0)

## 2023-03-02 LAB — RESP PANEL BY RT-PCR (RSV, FLU A&B, COVID)  RVPGX2
Influenza A by PCR: NEGATIVE
Influenza B by PCR: NEGATIVE
Resp Syncytial Virus by PCR: NEGATIVE
SARS Coronavirus 2 by RT PCR: NEGATIVE

## 2023-03-02 LAB — COMPREHENSIVE METABOLIC PANEL
ALT: 15 U/L (ref 0–44)
AST: 26 U/L (ref 15–41)
Albumin: 4.7 g/dL (ref 3.5–5.0)
Alkaline Phosphatase: 84 U/L (ref 38–126)
Anion gap: 14 (ref 5–15)
BUN: 17 mg/dL (ref 8–23)
CO2: 20 mmol/L — ABNORMAL LOW (ref 22–32)
Calcium: 9.6 mg/dL (ref 8.9–10.3)
Chloride: 105 mmol/L (ref 98–111)
Creatinine, Ser: 0.84 mg/dL (ref 0.61–1.24)
GFR, Estimated: >60 mL/min (ref >60)
Glucose, Bld: 199 mg/dL — ABNORMAL HIGH (ref 70–99)
Potassium: 3.6 mmol/L (ref 3.5–5.1)
Sodium: 139 mmol/L (ref 135–145)
Total Bilirubin: 0.6 mg/dL (ref 0.0–1.2)
Total Protein: 7.8 g/dL (ref 6.5–8.1)

## 2023-03-02 LAB — TROPONIN I (HIGH SENSITIVITY): Troponin I (High Sensitivity): 14 ng/L (ref <18)

## 2023-03-02 NOTE — ED Provider Notes (Signed)
[facility] Provider Note   Event Date/Time   First MD Initiated Contact with Patient 03/02/23 1913     (approximate) History  Fall  HPI Steve Ramirez is a 88 y.o. male with a past medical history of dementia, multiple CVA/TIA, hypertension, type 2 diabetes, and hyperlipidemia who presents after being found down after a presumed fall 6 hours prior to arrival.  Staff states that they picked him up after a fall however he has not wanted to move since that time.  Patient's daughter at bedside and provides further history stating the patient has been not acting himself since she has been at his bedside and relates that he has had similar presentations when he has had pneumonia, high blood pressure, a TIA, or urinary tract infection in the past.  Patient does not complain of any pain at this time ROS: Unable to assess given patient's mental status   Physical Exam  Triage Vital Signs: ED Triage Vitals  Encounter Vitals Group     BP 03/02/23 1850 (!) 163/70     Systolic BP Percentile --      Diastolic BP Percentile --      Pulse Rate 03/02/23 1850 (!) 107     Resp 03/02/23 1850 20     Temp 03/02/23 1850 98.7 F (37.1 C)     Temp Source 03/02/23 1850 Oral     SpO2 03/02/23 1850 93 %     Weight --      Height --      Head Circumference --      Peak Flow --      Pain Score 03/02/23 1846 0     Pain Loc --      Pain Education --      Exclude from Growth Chart --    Most recent vital signs: Vitals:   03/02/23 1850 03/02/23 2337  BP: (!) 163/70 135/79  Pulse: (!) 107 (!) 111  Resp: 20 (!) 29  Temp: 98.7 F (37.1 C) 97.6 F (36.4 C)  SpO2: 93% 96%   General: Awake, cooperative CV:  Good peripheral perfusion.  Resp:  Normal effort.  Abd:  No distention.  Nontender to palpation Other:  Elderly the overweight Caucasian male resting comfortably in no acute distress ED Results / Procedures / Treatments  Labs (all labs ordered are listed, but only  abnormal results are displayed) Labs Reviewed  URINALYSIS, W/ REFLEX TO CULTURE (INFECTION SUSPECTED) - Abnormal; Notable for the following components:      Result Value   Color, Urine YELLOW (*)    APPearance CLEAR (*)    Hgb urine dipstick MODERATE (*)    Protein, ur 30 (*)    All other components within normal limits  COMPREHENSIVE METABOLIC PANEL - Abnormal; Notable for the following components:   CO2 20 (*)    Glucose, Bld 199 (*)    All other components within normal limits  CBC WITH DIFFERENTIAL/PLATELET - Abnormal; Notable for the following components:   Lymphs Abs 0.6 (*)    All other components within normal limits  RESP PANEL BY RT-PCR (RSV, FLU A&B, COVID)  RVPGX2  TROPONIN I (HIGH SENSITIVITY)   EKG ED ECG REPORT I, Merwyn Katos, the attending physician, personally viewed and interpreted this ECG. Date: 03/02/2023 EKG Time: 2013 Rate: 98 Rhythm: normal sinus rhythm QRS Axis: normal Intervals: normal ST/T Wave abnormalities: normal Narrative Interpretation: no evidence of acute ischemia RADIOLOGY ED MD interpretation: Chest x-ray shows mild  left basilar atelectasis without any other acute findings.  This imaging was independently interpreted  CT of the head without contrast interpreted by me shows no evidence of acute abnormalities including no intracerebral hemorrhage, obvious masses, or significant edema -Agree with radiology assessment Official radiology report(s): DG Chest Port 1 View Result Date: 03/02/2023 CLINICAL DATA:  Fall. EXAM: PORTABLE CHEST 1 VIEW COMPARISON:  Chest radiograph dated 09/12/2022. FINDINGS: Stable cardiomediastinal silhouette. Mild left basilar atelectasis. No focal consolidation, sizeable pleural effusion, or pneumothorax. No acute osseous abnormality. IMPRESSION: Mild left basilar atelectasis. Otherwise, no acute findings in the chest. Electronically Signed   By: Hart Robinsons M.D.   On: 03/02/2023 20:41   CT Head Wo  Contrast Result Date: 03/02/2023 CLINICAL DATA:  Altered mental status EXAM: CT HEAD WITHOUT CONTRAST TECHNIQUE: Contiguous axial images were obtained from the base of the skull through the vertex without intravenous contrast. RADIATION DOSE REDUCTION: This exam was performed according to the departmental dose-optimization program which includes automated exposure control, adjustment of the mA and/or kV according to patient size and/or use of iterative reconstruction technique. COMPARISON:  01/22/2023 FINDINGS: Brain: There is no mass, hemorrhage or extra-axial collection. The size and configuration of the ventricles and extra-axial CSF spaces are normal. There is hypoattenuation of the white matter, most commonly indicating chronic small vessel disease. Old left basal ganglia and thalamic small vessel infarcts. Vascular: Atherosclerotic calcification of the vertebral and internal carotid arteries at the skull base. No abnormal hyperdensity of the major intracranial arteries or dural venous sinuses. Skull: The visualized skull base, calvarium and extracranial soft tissues are normal. Sinuses/Orbits: No fluid levels or advanced mucosal thickening of the visualized paranasal sinuses. No mastoid or middle ear effusion. Normal orbits. Other: None. IMPRESSION: 1. No acute intracranial abnormality. 2. Old left basal ganglia and thalamic small vessel infarcts. 3. Chronic small vessel disease. Electronically Signed   By: Deatra Robinson M.D.   On: 03/02/2023 20:12   PROCEDURES: Critical Care performed: No Procedures MEDICATIONS ORDERED IN ED: Medications - No data to display IMPRESSION / MDM / ASSESSMENT AND PLAN / ED COURSE  I reviewed the triage vital signs and the nursing notes.                             The patient is on the cardiac monitor to evaluate for evidence of arrhythmia and/or significant heart rate changes. Patient's presentation is most consistent with acute presentation with potential threat to  life or bodily function. Presenting after a fall that occurred just prior to arrival, resulting in no apparent injury. The mechanism of injury was a mechanical ground level fall without syncope or near-syncope. The current level of pain is 0/10. There was no loss of consciousness, confusion, seizure, or memory impairment. There is not a laceration associated with the injury. Denies neck pain. The patient does not take blood thinner medications. Denies vomiting, numbness/weakness, fever UA negative, chest x-ray negative, white blood cell count normal Dispo: Discharge with PCP follow-up       FINAL CLINICAL IMPRESSION(S) / ED DIAGNOSES   Final diagnoses:  Fall, initial encounter   Rx / DC Orders   ED Discharge Orders     None      Note:  This document was prepared using Dragon voice recognition software and may include unintentional dictation errors.   Merwyn Katos, MD 03/03/23 [phone]

## 2023-03-02 NOTE — ED Notes (Signed)
This RN and Herminio Commons attempted to get Steve Ramirez up and ambulate. Steve Ramirez unable to stand up and ambulate.

## 2023-03-02 NOTE — ED Triage Notes (Addendum)
Patient presents to ED via [facility] from [facility] for a fall approx 6 hours PTA. Staff picked him up after fall but he has not moved since his fall.   Per staff at facility, pt is altered at baseline. Poor historian, but does report hx of seizures.

## 2023-03-03 NOTE — ED Notes (Signed)
Called [facility] to transport back to [facility] room 201 waiting on there arrival

## 2023-03-03 NOTE — ED Notes (Signed)
Attempted to call report to [facility], no answer.

## 2023-03-03 NOTE — ED Notes (Signed)
Attempted three times to call [facility] for report with no answer. EMS came for the pt on 02/21/2023 at 0054.

## 2023-03-03 NOTE — ED Notes (Signed)
Attempted to call report to [facility], no answer

## 2023-11-11 ENCOUNTER — Encounter: Payer: Self-pay | Admitting: Emergency Medicine

## 2023-11-11 ENCOUNTER — Emergency Department

## 2023-11-11 ENCOUNTER — Emergency Department
Admission: EM | Admit: 2023-11-11 | Discharge: 2023-11-11 | Disposition: A | Discharge status: Home / Self Care | Source: Ambulatory Visit | Attending: Emergency Medicine | Admitting: Emergency Medicine

## 2023-11-11 ENCOUNTER — Other Ambulatory Visit: Payer: Self-pay

## 2023-11-11 DIAGNOSIS — S0990XA Unspecified injury of head, initial encounter: Secondary | ICD-10-CM | POA: Diagnosis present

## 2023-11-11 DIAGNOSIS — I10 Essential (primary) hypertension: Secondary | ICD-10-CM | POA: Diagnosis not present

## 2023-11-11 DIAGNOSIS — M545 Low back pain, unspecified: Secondary | ICD-10-CM | POA: Insufficient documentation

## 2023-11-11 DIAGNOSIS — E119 Type 2 diabetes mellitus without complications: Secondary | ICD-10-CM | POA: Diagnosis not present

## 2023-11-11 DIAGNOSIS — W06XXXA Fall from bed, initial encounter: Secondary | ICD-10-CM | POA: Diagnosis not present

## 2023-11-11 DIAGNOSIS — W19XXXA Unspecified fall, initial encounter: Secondary | ICD-10-CM

## 2023-11-11 MED ORDER — ACETAMINOPHEN 325 MG PO TABS
650.0000 mg | ORAL_TABLET | Freq: Once | ORAL | Status: AC
Start: 2023-11-11 — End: 2023-11-11
  Administered 2023-11-11: 650 mg via ORAL
  Filled 2023-11-11: qty 2

## 2023-11-11 NOTE — Discharge Instructions (Addendum)
 The imaging of your head and your neck did not show any acute abnormalities but does show chronic changes.  We did not obtain x-rays of your back as you did not have any pain over the bones.  I believe your back pain is due to to muscle soreness.  You can take Tylenol  as needed for pain.  You can also use topical pain relievers like ice, heat and lidocaine  patches.  If your pain does not improve over the next few days please be reevaluated by another healthcare provider.  This to your primary care provider or urgent care or the emergency department.  Please return to the emergency department with any worsening symptoms.

## 2023-11-11 NOTE — ED Provider Notes (Signed)
 [facility] Provider Note    Event Date/Time   First MD Initiated Contact with Patient 11/11/23 [phone]     (approximate)   History   Fall   HPI  Steve Ramirez is a 88 y.o. male with PMH of diabetes, stroke, hypertension presents for evaluation after a fall.  Patient states that he rolled out of bed this morning.  States he fell between his bed and his nightstand.  Reports some pain to his lower back.      Physical Exam   Triage Vital Signs: ED Triage Vitals  Encounter Vitals Group     BP 11/11/23 0825 115/63     Girls Systolic BP Percentile --      Girls Diastolic BP Percentile --      Boys Systolic BP Percentile --      Boys Diastolic BP Percentile --      Pulse Rate 11/11/23 0825 76     Resp --      Temp 11/11/23 0825 98 F (36.7 C)     Temp src --      SpO2 11/11/23 0825 93 %     Weight 11/11/23 0826 173 lb 15.1 oz (78.9 kg)     Height 11/11/23 0826 5' 8 (1.727 m)     Head Circumference --      Peak Flow --      Pain Score 11/11/23 0826 4     Pain Loc --      Pain Education --      Exclude from Growth Chart --     Most recent vital signs: Vitals:   11/11/23 0825  BP: 115/63  Pulse: 76  Temp: 98 F (36.7 C)  SpO2: 93%   General: Awake, no distress.  CV:  Good peripheral perfusion.  RRR. Resp:  Normal effort.  CTAB. Abd:  No distention.  Other:  PERRL, EOM intact, symmetric facial movements, no pronator drift, no ataxia.  No point tenderness over the spine or tenderness over the paraspinal muscles.   ED Results / Procedures / Treatments   Labs (all labs ordered are listed, but only abnormal results are displayed) Labs Reviewed - No data to display  RADIOLOGY  CT head and cervical spine obtained, interpreted the images as well as reviewed the radiologist report which did not show any brain or neck injury but did show chronic changes.  Impression below.  IMPRESSION:  1. No acute brain injury.  2. Moderate chronic  ischemic microvascular disease and old lacunar  infarct over the left thalamus/basal ganglia.  3. No acute cervical spine injury.  4. Moderate spondylosis throughout the cervical spine with  multilevel disc disease and neural foraminal narrowing as described.  Mild stable canal stenosis at the C3-4 level.    PROCEDURES:  Critical Care performed: No  Procedures   MEDICATIONS ORDERED IN ED: Medications  acetaminophen  (TYLENOL ) tablet 650 mg (650 mg Oral Given 11/11/23 0923)     IMPRESSION / MDM / ASSESSMENT AND PLAN / ED COURSE  I reviewed the triage vital signs and the nursing notes.                             88 year old male presents for evaluation of lower back pain after a fall.  Vital signs are stable patient NAD on exam.  Differential diagnosis includes, but is not limited to, muscle strain, contusion, abrasion, intracranial bleed, cervical spine fracture.  Patient's  presentation is most consistent with acute complicated illness / injury requiring diagnostic workup.  Physical exam is overall reassuring.  Did not note any focal neurodeficits but will obtain imaging of head and neck as patient does take a blood thinner and he did hit his head.  Patient was not tender to palpation over the spine, did consider x-ray but have low suspicion for fracture given no tenderness.  Will treat patient's pain with Tylenol  and reassess.  CT head and cervical spine were negative for acute abnormalities but do show chronic changes.  Patient reassessed and reports that his pain has improved since taking the Tylenol .  Given negative imaging and pain is well-controlled feel that patient is stable for discharge and outpatient management.  Patient voiced understanding, all questions were answered and he was stable at discharge.      FINAL CLINICAL IMPRESSION(S) / ED DIAGNOSES   Final diagnoses:  Fall, initial encounter     Rx / DC Orders   ED Discharge Orders     None         Note:  This document was prepared using Dragon voice recognition software and may include unintentional dictation errors.   Cleaster Tinnie LABOR, PA-C 11/11/23 1018    Dorothyann Drivers, MD 11/11/23 1511

## 2023-11-11 NOTE — ED Triage Notes (Signed)
 Presents via EMS from [employer] he rolled out of bed  Hitting the night stand Having some pain to lower back  Was able to stand and pivot

## 2023-11-20 ENCOUNTER — Emergency Department

## 2023-11-20 ENCOUNTER — Emergency Department
Admission: EM | Admit: 2023-11-20 | Discharge: 2023-11-21 | Disposition: A | Discharge status: Skilled Nursing Facility | Attending: Emergency Medicine | Admitting: Emergency Medicine

## 2023-11-20 ENCOUNTER — Other Ambulatory Visit: Payer: Self-pay

## 2023-11-20 DIAGNOSIS — R8271 Bacteriuria: Secondary | ICD-10-CM | POA: Diagnosis not present

## 2023-11-20 DIAGNOSIS — Z7902 Long term (current) use of antithrombotics/antiplatelets: Secondary | ICD-10-CM | POA: Insufficient documentation

## 2023-11-20 DIAGNOSIS — W0110XA Fall on same level from slipping, tripping and stumbling with subsequent striking against unspecified object, initial encounter: Secondary | ICD-10-CM | POA: Diagnosis not present

## 2023-11-20 DIAGNOSIS — E119 Type 2 diabetes mellitus without complications: Secondary | ICD-10-CM | POA: Diagnosis not present

## 2023-11-20 DIAGNOSIS — K5900 Constipation, unspecified: Secondary | ICD-10-CM

## 2023-11-20 DIAGNOSIS — S0990XA Unspecified injury of head, initial encounter: Secondary | ICD-10-CM | POA: Insufficient documentation

## 2023-11-20 DIAGNOSIS — W19XXXA Unspecified fall, initial encounter: Secondary | ICD-10-CM

## 2023-11-20 DIAGNOSIS — Z8673 Personal history of transient ischemic attack (TIA), and cerebral infarction without residual deficits: Secondary | ICD-10-CM | POA: Insufficient documentation

## 2023-11-20 LAB — URINALYSIS, ROUTINE W REFLEX MICROSCOPIC
Bacteria, UA: NONE SEEN
Bilirubin Urine: NEGATIVE
Glucose, UA: NEGATIVE mg/dL
Hgb urine dipstick: NEGATIVE
Ketones, ur: NEGATIVE mg/dL
Nitrite: NEGATIVE
Protein, ur: NEGATIVE mg/dL
Specific Gravity, Urine: 1.014 (ref 1.005–1.030)
pH: 5 (ref 5.0–8.0)

## 2023-11-20 LAB — BASIC METABOLIC PANEL WITH GFR
Anion gap: 12 (ref 5–15)
BUN: 18 mg/dL (ref 8–23)
CO2: 22 mmol/L (ref 22–32)
Calcium: 8.8 mg/dL — ABNORMAL LOW (ref 8.9–10.3)
Chloride: 106 mmol/L (ref 98–111)
Creatinine, Ser: 0.98 mg/dL (ref 0.61–1.24)
GFR, Estimated: >60 mL/min (ref >60)
Glucose, Bld: 143 mg/dL — ABNORMAL HIGH (ref 70–99)
Potassium: 3.9 mmol/L (ref 3.5–5.1)
Sodium: 140 mmol/L (ref 135–145)

## 2023-11-20 LAB — CBC
HCT: 36.3 % — ABNORMAL LOW (ref 39.0–52.0)
Hemoglobin: 12.1 g/dL — ABNORMAL LOW (ref 13.0–17.0)
MCH: 26.9 pg (ref 26.0–34.0)
MCHC: 33.3 g/dL (ref 30.0–36.0)
MCV: 80.8 fL (ref 80.0–100.0)
Platelets: 252 K/uL (ref 150–400)
RBC: 4.49 MIL/uL (ref 4.22–5.81)
RDW: 13.6 % (ref 11.5–15.5)
WBC: 4.9 K/uL (ref 4.0–10.5)
nRBC: 0 % (ref 0.0–0.2)

## 2023-11-20 MED ORDER — SMOG ENEMA
960.0000 mL | Freq: Once | RECTAL | Status: AC
  Administered 2023-11-21: 960 mL via RECTAL
  Filled 2023-11-20: qty 240

## 2023-11-20 MED ORDER — FOSFOMYCIN TROMETHAMINE 3 G PO PACK
3.0000 g | PACK | ORAL | Status: AC
  Administered 2023-11-20: 3 g via ORAL
  Filled 2023-11-20: qty 3

## 2023-11-20 NOTE — ED Triage Notes (Signed)
 Presents via EMS from [facility] [place] for multiple falls today. First fall patient states legs felt extremely weak and slid himself to the floor. Second fall he missed the chair and head bounced against the floor twice. Complaining of headache and sore coccyx. Takes Plavix  hx of CVA.

## 2023-11-20 NOTE — ED Notes (Signed)
 Patient transported to CT

## 2023-11-21 LAB — HEPATIC FUNCTION PANEL
ALT: 16 U/L (ref 0–44)
AST: 24 U/L (ref 15–41)
Albumin: 3.8 g/dL (ref 3.5–5.0)
Alkaline Phosphatase: 83 U/L (ref 38–126)
Bilirubin, Direct: 0.3 mg/dL — ABNORMAL HIGH (ref 0.0–0.2)
Indirect Bilirubin: 0.4 mg/dL (ref 0.3–0.9)
Total Bilirubin: 0.7 mg/dL (ref 0.0–1.2)
Total Protein: 6.4 g/dL — ABNORMAL LOW (ref 6.5–8.1)

## 2023-11-21 LAB — LIPASE, BLOOD: Lipase: 23 U/L (ref 11–51)

## 2023-11-21 MED ORDER — POLYETHYLENE GLYCOL 3350 17 G PO PACK: 17.0000 g | PACK | Freq: Every day | ORAL | 0 refills | Status: AC

## 2023-11-21 MED ORDER — DOCUSATE SODIUM 100 MG PO CAPS: 100.0000 mg | ORAL_CAPSULE | Freq: Two times a day (BID) | ORAL | 0 refills | Status: AC

## 2023-11-21 NOTE — ED Notes (Addendum)
 Pt given phone to make phone call per pt request.

## 2023-11-21 NOTE — ED Provider Notes (Signed)
 12:00 AM  Assumed care at shift change.  Patient here after a fall.  Workup from his fall is unremarkable but on reevaluation patient complaining of constipation.  No abdominal pain, rectal pain, vomiting.  Abdominal x-ray reviewed and interpreted by myself and the radiologist and shows no bowel obstruction.  Enema ordered.  2:25 AM  Pt able to have a large bowel movement here after enema and reports feeling much better.  I feel he is safe to be discharged back [facility].  Will discharge with MiraLAX  and Colace and instructions for high-fiber diet.   At this time, I do not feel there is any life-threatening condition present. I reviewed all nursing notes, vitals, pertinent previous records.  All lab and urine results, EKGs, imaging ordered have been independently reviewed and interpreted by myself.  I reviewed all available radiology reports from any imaging ordered this visit.  Based on my assessment, I feel the patient is safe to be discharged home without further emergent workup and can continue workup as an outpatient as needed. Discussed all findings, treatment plan as well as usual and customary return precautions.  They verbalize understanding and are comfortable with this plan.  Outpatient follow-up has been provided as needed.  All questions have been answered.    Steve Ramirez, Josette SAILOR, DO 11/21/23 [phone]

## 2023-11-21 NOTE — ED Notes (Signed)
 Report given to Addy, at home place of [place].

## 2023-11-21 NOTE — Discharge Instructions (Addendum)
 I recommend that you increase your water  and fiber intake. If you are not able to eat foods high in fiber, you may use Benefiber or Metamucil over-the-counter. I also recommend you use MiraLAX  1-2 times a day and Colace 100 mg twice a day to help with bowel movements. These medications are over the counter.  Please note that some of these medications may cause you to have abdominal cramping which is normal. If you develop severe abdominal pain, fever (temperature of 100.4 or higher), persistent vomiting, distention of your abdomen, unable to have a bowel movement for 5 days or are not passing gas, please return to the hospital.

## 2023-11-21 NOTE — ED Notes (Signed)
 Had 2 large episode of liquid/solid stool. Bed sheets changed. Water /juice provided

## 2023-11-21 NOTE — ED Notes (Signed)
 Call placed to son Selinda, provided update on patients care. Informed him that transportation will arrive after 10 am and if they would like to pick him up earlier. States they wont be able to get him and to allow the transportation company to take him back.

## 2023-11-21 NOTE — ED Provider Notes (Signed)
 [facility] Provider Note    Event Date/Time   First MD Initiated Contact with Patient 11/21/23 0003     (approximate)   History   Fall (Multiple falls today )   HPI  Steve Ramirez is a 88 y.o. male has a history of multiple strokes in the past, TIA, type 2 diabetes chronic problems with left knee frequent falls.  Patient reports he has been in his normal health he has not noticed any concerns other than he fell today. He reports that he suffers with issues where his left knee will give out for him on occasion, as the bone longstanding issue.  He reports that he generally falls at least a couple times a week.  Today though when his knee started gave out he fell and he reports that he bounced his head off the concrete twice.  There is no pain no neck pain except for some chronic left-sided neck pain that he reports is not new.  No weakness or numbness.  No nausea or vomiting.   He has had on review of systems, a little bit of discomfort with urination for about a month now.  No flank pain.  He also tells me later during his evaluation at he is feeling a bit constipated they have tried to give him different laxatives about 3 times over the last week.  He is not had a good bowel meant for about 10 days.  He is passing small amounts of stool like little acorns.  And still passing gas.  No pain though.  He still eating and drinking normally.  He has had constipation before and he reports it feels like that but does not think it is all related to the fall     Physical Exam   Triage Vital Signs: ED Triage Vitals  Encounter Vitals Group     BP 11/20/23 2107 114/60     Girls Systolic BP Percentile --      Girls Diastolic BP Percentile --      Boys Systolic BP Percentile --      Boys Diastolic BP Percentile --      Pulse Rate 11/20/23 2107 (!) 58     Resp 11/20/23 2107 19     Temp 11/20/23 2107 98.4 F (36.9 C)     Temp Source 11/20/23 2107 Oral      SpO2 11/20/23 2103 96 %     Weight 11/20/23 2109 179 lb 10.8 oz (81.5 kg)     Height 11/20/23 2109 5' 8 (1.727 m)     Head Circumference --      Peak Flow --      Pain Score 11/20/23 2108 3     Pain Loc --      Pain Education --      Exclude from Growth Chart --     Most recent vital signs: Vitals:   11/20/23 2130 11/21/23 0030  BP: (!) 143/75 (!) 126/57  Pulse: (!) 56 (!) 58  Resp:  19  Temp:    SpO2: 96% 94%     General: Awake, no distress.  Normocephalic atraumatic.  No midline cervical tenderness reports mild pain over the left side neck but reports that is chronic and not new moves all extremities well moves hips and arms well without deficit.  Speech is just slightly slurred, and he reports that chronic no change due to his multiple previous strokes CV:  Good peripheral perfusion.  Normal tones and rate  Resp:  Normal effort.  Clear bilateral Abd:  No distention.  Soft nontender nondistended.  No rebound or guarding in any quadrant.  Soft reassuring exam Other:  Fully alert and oriented   ED Results / Procedures / Treatments   Labs (all labs ordered are listed, but only abnormal results are displayed) Labs Reviewed  CBC - Abnormal; Notable for the following components:      Result Value   Hemoglobin 12.1 (*)    HCT 36.3 (*)    All other components within normal limits  BASIC METABOLIC PANEL WITH GFR - Abnormal; Notable for the following components:   Glucose, Bld 143 (*)    Calcium  8.8 (*)    All other components within normal limits  URINALYSIS, ROUTINE W REFLEX MICROSCOPIC - Abnormal; Notable for the following components:   Color, Urine YELLOW (*)    APPearance CLEAR (*)    Leukocytes,Ua LARGE (*)    All other components within normal limits  URINE CULTURE  HEPATIC FUNCTION PANEL  LIPASE, BLOOD   Labs notable for just a very mild anemia.  Also noted is large leukocytes and white cells but no bacteria.  Given the patient has associated slight dysuria for a  month no flank pain at possibly hide may have a mild uncomplicated UTI.  Will give single dose fosfomycin and send for culture.  Discussed with patient he is understanding of the single dose of antibiotic that was given today  EKG  EKG interpreted by me at 2130 heart rate 60 QRS 160 QTc 500.  First-degree AV block, no evidence of acute.  Compared with previous first-degree AV block is old finding.  Patient with no associate chest pain palpitations or cardiac symptoms   RADIOLOGY  CT Cervical Spine Wo Contrast Result Date: 11/20/2023 EXAM: CT CERVICAL SPINE WITHOUT CONTRAST 11/20/2023 09:50:55 PM TECHNIQUE: CT of the cervical spine was performed without the administration of intravenous contrast. Multiplanar reformatted images are provided for review. Automated exposure control, iterative reconstruction, and/or weight based adjustment of the mA/kV was utilized to reduce the radiation dose to as low as reasonably achievable. COMPARISON: None available. CLINICAL HISTORY: Neck trauma (Age >= 65y) FINDINGS: CERVICAL SPINE: BONES AND ALIGNMENT: No acute fracture or traumatic malalignment. DEGENERATIVE CHANGES: Degenerative changes of the mid cervical spine with fusion at C4-C5 and C6-C7. SOFT TISSUES: No prevertebral soft tissue swelling. IMPRESSION: 1. No acute abnormality of the cervical spine. Electronically signed by: Pinkie Pebbles MD 11/20/2023 09:55 PM EST RP Workstation: [id number]   CT Head Wo Contrast Result Date: 11/20/2023 EXAM: CT HEAD WITHOUT CONTRAST 11/20/2023 09:50:55 PM TECHNIQUE: CT of the head was performed without the administration of intravenous contrast. Automated exposure control, iterative reconstruction, and/or weight based adjustment of the mA/kV was utilized to reduce the radiation dose to as low as reasonably achievable. COMPARISON: 11/11/2023 CLINICAL HISTORY: Facial trauma, blunt. FINDINGS: BRAIN AND VENTRICLES: No acute hemorrhage. No evidence of acute infarct. Old left  basal ganglia lacunar infarct. Global cortical atrophy. Subcortical and periventricular small vessel ischemic changes. Intracranial atherosclerosis. No hydrocephalus. No extra-axial collection. No mass effect or midline shift. ORBITS: No acute abnormality. SINUSES: No acute abnormality. SOFT TISSUES AND SKULL: No acute soft tissue abnormality. No skull fracture. IMPRESSION: 1. No acute intracranial abnormality. Electronically signed by: Pinkie Pebbles MD 11/20/2023 09:52 PM EST RP Workstation: [id number]      PROCEDURES:  Critical Care performed: No  Procedures   MEDICATIONS ORDERED IN ED: Medications  sorbitol , magnesium  hydroxide, mineral oil, glycerin (SMOG) enema (  has no administration in time range)  fosfomycin (MONUROL) packet 3 g (3 g Oral Given 11/20/23 2351)     IMPRESSION / MDM / ASSESSMENT AND PLAN / ED COURSE  I reviewed the triage vital signs and the nursing notes.                              Differential diagnosis includes, but is not limited to, injuries due to fall.  Patient reports a mechanical fall reports he falls frequently was recently evaluated for somewhat similar situation a couple weeks ago.  He is awake alert well-oriented normocephalic atraumatic.  Given the mechanism CT imaging of the head and neck is performed which is negative.  No chest pain no palpitations no presyncopal feeling.  Denies any preceding concerns of illness other than slight dysuria for a month and also reports decreased stooling.  Abdomen is very reassuring soft nontender doubt acute intra-abdominal process but seems more consistent with probable constipation as reports similar in the past.  Will obtain abdominal x-ray with a low pretest probability for acute intra-abdominal obstruction or acute emergent finding based on exam   Patient's presentation is most consistent with acute complicated illness / injury requiring diagnostic workup.      Clinical Course as of 11/21/23 0044  Sat  Nov 20, 2023  2123 Patient advises he does not any significant pain or distress, does not wish for any pain medicine. [MQ]  2328 Resting, no distress.  [MQ]    Clinical Course User Index [MQ] Dicky Anes, MD   Discussed with patient comfortable plan to discharge to home if imaging reassuring of the abdomen.  Understands he was treated with single dose for treatment of possible UTI.    Ongoing care signed Dr. Neomi with plan to follow-up on results of abdominal imaging  FINAL CLINICAL IMPRESSION(S) / ED DIAGNOSES   Final diagnoses:  Fall, initial encounter  Bacteria in urine  Closed head injury, initial encounter     Rx / DC Orders   ED Discharge Orders     None        Note:  This document was prepared using Dragon voice recognition software and may include unintentional dictation errors.   Dicky Anes, MD 11/21/23 [phone]

## 2023-11-23 LAB — URINE CULTURE: Culture: >=100000 — AB

## 2023-11-24 NOTE — Progress Notes (Signed)
 ED Antimicrobial Stewardship Positive Culture Follow Up   HEYDEN JABER is an 88 y.o. male who presented to [facility] on 11/20/2023 with a chief complaint of  Chief Complaint  Patient presents with   Fall    Multiple falls today     Recent Results (from the past 720 hours)  Urine Culture     Status: Abnormal   Collection Time: 11/20/23  9:28 PM   Specimen: Urine, Clean Catch  Result Value Ref Range Status   Specimen Description   Final    URINE, CLEAN CATCH Performed at [facility], [address], [place], [place] [address]    Special Requests   Final    NONE Performed at [facility], [address] [place], [place] [address]    Culture >=100,000 COLONIES/mL [facility] MORGANII (A)  Final   Report Status 11/23/2023 FINAL  Final   Organism ID, Bacteria MORGANELLA MORGANII (A)  Final      Susceptibility   Morganella morganii - MIC*    AMPICILLIN >=32 RESISTANT Resistant     ERTAPENEM <=0.12 SENSITIVE Sensitive     CIPROFLOXACIN <=0.06 SENSITIVE Sensitive     GENTAMICIN <=1 SENSITIVE Sensitive     NITROFURANTOIN 128 RESISTANT Resistant     TRIMETH/SULFA <=20 SENSITIVE Sensitive     AMPICILLIN/SULBACTAM 16 INTERMEDIATE Intermediate     PIP/TAZO Value in next row Sensitive      <=4 SENSITIVEThis is a modified FDA-approved test that has been validated and its performance characteristics determined by the reporting laboratory.  This laboratory is certified under the Clinical Laboratory Improvement Amendments CLIA as qualified to perform high complexity clinical laboratory testing.    MEROPENEM Value in next row Sensitive      <=4 SENSITIVEThis is a modified FDA-approved test that has been validated and its performance characteristics determined by the reporting laboratory.  This laboratory is certified under the Clinical Laboratory Improvement Amendments CLIA as qualified to perform high complexity clinical laboratory testing.    * >=100,000 COLONIES/mL  MORGANELLA MORGANII    [x]  Treated with Fosfomycin, organism resistant to prescribed antimicrobial []  Patient discharged originally without antimicrobial agent and treatment is now indicated  New antibiotic prescription: Bactrim 1 DS BID x 3 days  ED Provider: Dr. Jossie   Patient is a 88 yo M who presented to the ED with multiple falls and weakness. He mentioned having some discomfort with urination that had been going on for about a month. A urine culture was obtained and patient received a dose of fosfomycin before being discharged. Urine culture now growing Morganella morganii, which is intrinsically resistant to fosfomycin. Discussed with ED provider and will treat with Bactrim 1 DS BID x 3 days since patient was having some urinary discomfort.   Called patient and spoke with his daughter-in-law. Informed her about the urine culture results and change in treatment plan. Called the prescription in to [facility].   Ransom Blanch PGY-1 Pharmacy Resident   - [facility]  11/24/2023 4:58 PM

## 2023-12-15 ENCOUNTER — Other Ambulatory Visit: Payer: Self-pay

## 2023-12-15 ENCOUNTER — Emergency Department

## 2023-12-15 ENCOUNTER — Emergency Department
Admission: EM | Admit: 2023-12-15 | Discharge: 2023-12-15 | Disposition: A | Discharge status: Home / Self Care | Attending: Emergency Medicine | Admitting: Emergency Medicine

## 2023-12-15 DIAGNOSIS — G9341 Metabolic encephalopathy: Secondary | ICD-10-CM

## 2023-12-15 DIAGNOSIS — E119 Type 2 diabetes mellitus without complications: Secondary | ICD-10-CM | POA: Insufficient documentation

## 2023-12-15 DIAGNOSIS — I10 Essential (primary) hypertension: Secondary | ICD-10-CM | POA: Insufficient documentation

## 2023-12-15 DIAGNOSIS — I2510 Atherosclerotic heart disease of native coronary artery without angina pectoris: Secondary | ICD-10-CM | POA: Diagnosis not present

## 2023-12-15 DIAGNOSIS — R309 Painful micturition, unspecified: Secondary | ICD-10-CM | POA: Insufficient documentation

## 2023-12-15 DIAGNOSIS — Z8673 Personal history of transient ischemic attack (TIA), and cerebral infarction without residual deficits: Secondary | ICD-10-CM | POA: Diagnosis not present

## 2023-12-15 DIAGNOSIS — Z7982 Long term (current) use of aspirin: Secondary | ICD-10-CM | POA: Insufficient documentation

## 2023-12-15 DIAGNOSIS — R410 Disorientation, unspecified: Secondary | ICD-10-CM | POA: Insufficient documentation

## 2023-12-15 LAB — URINALYSIS, ROUTINE W REFLEX MICROSCOPIC
Bacteria, UA: NONE SEEN
Bilirubin Urine: NEGATIVE
Glucose, UA: NEGATIVE mg/dL
Hgb urine dipstick: NEGATIVE
Ketones, ur: NEGATIVE mg/dL
Nitrite: NEGATIVE
Protein, ur: NEGATIVE mg/dL
Specific Gravity, Urine: 1.02 (ref 1.005–1.030)
pH: 5 (ref 5.0–8.0)

## 2023-12-15 LAB — COMPREHENSIVE METABOLIC PANEL WITH GFR
ALT: 10 U/L (ref 0–44)
AST: 10 U/L — ABNORMAL LOW (ref 15–41)
Albumin: 4.6 g/dL (ref 3.5–5.0)
Alkaline Phosphatase: 112 U/L (ref 38–126)
Anion gap: 12 (ref 5–15)
BUN: 15 mg/dL (ref 8–23)
CO2: 24 mmol/L (ref 22–32)
Calcium: 9.6 mg/dL (ref 8.9–10.3)
Chloride: 105 mmol/L (ref 98–111)
Creatinine, Ser: 0.85 mg/dL (ref 0.61–1.24)
GFR, Estimated: >60 mL/min (ref >60)
Glucose, Bld: 108 mg/dL — ABNORMAL HIGH (ref 70–99)
Potassium: 4.1 mmol/L (ref 3.5–5.1)
Sodium: 140 mmol/L (ref 135–145)
Total Bilirubin: 0.4 mg/dL (ref 0.0–1.2)
Total Protein: 7.5 g/dL (ref 6.5–8.1)

## 2023-12-15 LAB — TSH: TSH: 1.27 u[IU]/mL (ref 0.350–4.500)

## 2023-12-15 LAB — CBC
HCT: 43.7 % (ref 39.0–52.0)
Hemoglobin: 14.4 g/dL (ref 13.0–17.0)
MCH: 26.7 pg (ref 26.0–34.0)
MCHC: 33 g/dL (ref 30.0–36.0)
MCV: 81.1 fL (ref 80.0–100.0)
Platelets: 238 K/uL (ref 150–400)
RBC: 5.39 MIL/uL (ref 4.22–5.81)
RDW: 13.6 % (ref 11.5–15.5)
WBC: 6 K/uL (ref 4.0–10.5)
nRBC: 0 % (ref 0.0–0.2)

## 2023-12-15 LAB — T4, FREE: Free T4: 1 ng/dL (ref 0.61–1.12)

## 2023-12-15 LAB — TROPONIN T, HIGH SENSITIVITY: Troponin T High Sensitivity: <15 ng/L (ref 0–19)

## 2023-12-15 MED ORDER — CIPROFLOXACIN HCL 500 MG PO TABS: 500.0000 mg | ORAL_TABLET | Freq: Two times a day (BID) | ORAL | 0 refills | Status: AC

## 2023-12-15 MED ORDER — CIPROFLOXACIN HCL 500 MG PO TABS
500.0000 mg | ORAL_TABLET | Freq: Once | ORAL | Status: AC
  Administered 2023-12-15: 500 mg via ORAL
  Filled 2023-12-15: qty 1

## 2023-12-15 NOTE — ED Notes (Signed)
 Pt verbalizes understanding of discharge instructions. Opportunity for questioning and answers were provided. Pt discharged from ED family.

## 2023-12-15 NOTE — ED Notes (Signed)
 Pt assisted to [facility] by this RN. Urine sample provided.

## 2023-12-15 NOTE — Discharge Instructions (Signed)
 Ciprofloxacin antibiotics twice daily for 5 more days.  1 dose provided in the ED, can start remainder of prescription Thursday morning.  Urine culture in process  Return to the ED with any worsening symptoms despite these measures

## 2023-12-15 NOTE — ED Provider Notes (Signed)
 Patient received in signout from Dr. Hobert.  Patient with history of drug-resistant UTI with acute dysuria and confusion.  Urine with moderate leukocytes and alongside his symptoms and history is concerning for infection.  I reviewed urine culture from last month and I also consult with our pharmacist on-call who recommended ciprofloxacin.  I reassessed the patient he and daughter-in-law have a desire to not be hospitalized and to return patient to home place of [place].  Will discharge with prescription after first dose in the ED, culture in process.   Claudene Rover, MD 12/15/23 [phone]

## 2023-12-15 NOTE — ED Provider Notes (Signed)
 [facility] Provider Note    Event Date/Time   First MD Initiated Contact with Patient 12/15/23 1405     (approximate)   History   Altered Mental Status   HPI  Steve Ramirez is a 88 y.o. male with coronary disease, hypertension, diabetes, depression, history of TIAs who comes in from home place of [place] due to concerns for altered mental status.  I reviewed a note on 01/22/2023 where I evaluated patient and he had some staring off episodes.  Patient is on aspirin , Plavix  and has had multiple admissions for stroke workup there was concern for potential seizure.  Given he was at his baseline self with a wanted to try to keep him in the hospital.  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  According family patient was on treatment for UTI a few weeks ago.  I reviewed the notes and this was on 11/20/2023 where he had urine culture that was resistant other than to Cipro , Bactrim.  I do not see where patient was placed on antibiotics but quite a family they did report that he was given some antibiotics, Bactrim.  He never really had any symptoms however.  However since Thanksgiving they noticed him to be a little bit more confused just with knowing the names of people.  No new falls and he was now reporting some burning with urination which is what prompted them to seek evaluation for it.  They were concerned that he could have a UTI.  Physical Exam   Triage Vital Signs: ED Triage Vitals  Encounter Vitals Group     BP 12/15/23 1211 109/87     Girls Systolic BP Percentile --      Girls Diastolic BP Percentile --      Boys Systolic BP Percentile --      Boys Diastolic BP Percentile --      Pulse Rate 12/15/23 1211 62     Resp 12/15/23 1211 18     Temp 12/15/23 1211 98.5 F (36.9 C)     Temp src --      SpO2  12/15/23 1211 97 %     Weight --      Height --      Head Circumference --      Peak Flow --      Pain Score 12/15/23 1207 2     Pain Loc --      Pain Education --      Exclude from Growth Chart --     Most recent vital signs: Vitals:   12/15/23 1211  BP: 109/87  Pulse: 62  Resp: 18  Temp: 98.5 F (36.9 C)  SpO2: 97%     General: Awake, no distress.  CV:  Good peripheral perfusion.  Resp:  Normal effort.  Abd:  No distention.  Soft nontender Other:  Moving all extremities well as cranial nerves are intact. AO x2 No swelling in legs.  No calf tenderness.  ED Results / Procedures / Treatments   Labs (all labs ordered are listed, but only abnormal results are displayed) Labs Reviewed  COMPREHENSIVE METABOLIC PANEL WITH GFR - Abnormal; Notable for the following components:      Result Value   Glucose, Bld 108 (*)    AST 10 (*)    All other components within normal  limits  CBC  URINALYSIS, ROUTINE W REFLEX MICROSCOPIC  CBG MONITORING, ED     EKG  My interpretation of EKG:  Normal sinus rate of 61, no st elevation, RBBB and 1st degree av block.   RADIOLOGY I have reviewed the ct personally and interpreted no evidence of intracranial hemorrhage   PROCEDURES:  Critical Care performed: No  Procedures   MEDICATIONS ORDERED IN ED: Medications - No data to display   IMPRESSION / MDM / ASSESSMENT AND PLAN / ED COURSE  I reviewed the triage vital signs and the nursing notes.   Patient's presentation is most consistent with acute presentation with potential threat to life or bodily function.    Pt comes in with some confusion- appears neuro intact, not consistent with stroke or seizure as he has had previously.  Concern for possible UTI given reports some urinary symptoms.  At baseline patient is incontinent will get bladder scan just to ensure not significantly retaining and we will do In-N-Out catheter.  However discussed with family about Foley placement  if there is some mild retention they like to try to hold off given he is got no evidence of pain to suggest urinary retention, no kidney dysfunction and he denies having inability to urinate at home.  Daughter was concerned but maybe he did seem a little bit more short of breath but he denies any symptoms to suggest this.  I will add on chest x-ray to evaluate for any pneumonia and cardiac markers evaluate for ACS.  Offered COVID testing but declined as he had had COVID about a 2 months ago.  He has got no evidence of DVT on examination and I do not believe this is consistent with pulmonary embolism as he not hypoxic, not tachycardic.  He would also not be a good candidate for blood thinners given recurrent falls and he is already on aspirin  and Plavix  and so PE seems less likely at this time.   CBC is reassuring.  CMP is reassuring.  IMPRESSION: 1. No acute intracranial abnormality. 2. Stable advanced cerebral atrophy and diffuse white matter disease. This most likely reflects the sequelae of chronic microvascular ischemia. 3. Stable remote lacunar infarct involving the left thalamus and posterior limb of the internal capsule.  We had an oncoming team pending additional blood work as well as urine.  The patient is on the cardiac monitor to evaluate for evidence of arrhythmia and/or significant heart rate changes.      FINAL CLINICAL IMPRESSION(S) / ED DIAGNOSES   Final diagnoses:  Confusion     Rx / DC Orders   ED Discharge Orders     None        Note:  This document was prepared using Dragon voice recognition software and may include unintentional dictation errors.   Ernest Ronal BRAVO, MD 12/15/23 [phone]

## 2023-12-15 NOTE — ED Triage Notes (Addendum)
 Pt comes with c/o AMS. Pt live at [facility]. Pt stats he doesn't know why he I here. Pt is A*O to self and place.  Pt state some burning with urination. Family reports he ws dx with UTI and placed on meds. Pt states things also dont look right. Pt was more confused now since Thanksgiving. Pt has been sleeping more too.

## 2023-12-17 LAB — URINE CULTURE: Culture: >=100000 — AB

## 2023-12-18 NOTE — Consult Note (Addendum)
 Called family member as Mr Langenbach was not able to come to the phone. Spoke with daughter in law and she states the patient is doing better. I instructed her if he starts to get worse or does not improve, recommend bringing him back to the ED or calling his PCP. Cipro  may have activity against aerococcus but susceptibility testing is not available for this organism. If he gets worse, possibly can start a course of augmentin. Discussed case with Dr. Willo.    Thanks,  Cathaleen Blanch, PharmD, BCPS
